# Patient Record
Sex: Female | Born: 1970 | Race: Black or African American | Hispanic: No | Smoking: Former smoker
Health system: Southern US, Community
[De-identification: ages and names within clinical notes are randomized; demographics above are authoritative.]

## PROBLEM LIST (undated history)

## (undated) DIAGNOSIS — R51 Headache: Secondary | ICD-10-CM

## (undated) DIAGNOSIS — R519 Headache, unspecified: Secondary | ICD-10-CM

## (undated) DIAGNOSIS — I161 Hypertensive emergency: Secondary | ICD-10-CM

## (undated) DIAGNOSIS — R112 Nausea with vomiting, unspecified: Secondary | ICD-10-CM

## (undated) DIAGNOSIS — R197 Diarrhea, unspecified: Secondary | ICD-10-CM

## (undated) DIAGNOSIS — I509 Heart failure, unspecified: Secondary | ICD-10-CM

## (undated) DIAGNOSIS — R569 Unspecified convulsions: Secondary | ICD-10-CM

## (undated) DIAGNOSIS — R011 Cardiac murmur, unspecified: Secondary | ICD-10-CM

## (undated) DIAGNOSIS — E559 Vitamin D deficiency, unspecified: Secondary | ICD-10-CM

## (undated) DIAGNOSIS — J189 Pneumonia, unspecified organism: Secondary | ICD-10-CM

## (undated) DIAGNOSIS — H6120 Impacted cerumen, unspecified ear: Secondary | ICD-10-CM

## (undated) DIAGNOSIS — J302 Other seasonal allergic rhinitis: Secondary | ICD-10-CM

## (undated) DIAGNOSIS — I10 Essential (primary) hypertension: Secondary | ICD-10-CM

## (undated) DIAGNOSIS — E785 Hyperlipidemia, unspecified: Secondary | ICD-10-CM

## (undated) DIAGNOSIS — Z8719 Personal history of other diseases of the digestive system: Secondary | ICD-10-CM

## (undated) HISTORY — PX: TONSILLECTOMY AND ADENOIDECTOMY: SUR1326

---
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
## 1998-07-11 HISTORY — PX: TUBAL LIGATION: SHX77

## 2006-05-18 ENCOUNTER — Emergency Department (HOSPITAL_COMMUNITY): Admission: EM | Admit: 2006-05-18 | Discharge: 2006-05-19 | Payer: Self-pay | Admitting: Emergency Medicine

## 2007-10-02 ENCOUNTER — Emergency Department (HOSPITAL_COMMUNITY): Admission: EM | Admit: 2007-10-02 | Discharge: 2007-10-02 | Payer: Self-pay | Admitting: Emergency Medicine

## 2009-05-11 ENCOUNTER — Inpatient Hospital Stay (HOSPITAL_COMMUNITY): Admission: AD | Admit: 2009-05-11 | Discharge: 2009-05-12 | Payer: Self-pay | Admitting: Obstetrics and Gynecology

## 2009-12-30 ENCOUNTER — Ambulatory Visit: Payer: Self-pay | Admitting: Nurse Practitioner

## 2009-12-30 ENCOUNTER — Emergency Department (HOSPITAL_COMMUNITY): Admission: EM | Admit: 2009-12-30 | Discharge: 2009-12-30 | Payer: Self-pay | Admitting: Emergency Medicine

## 2009-12-30 ENCOUNTER — Encounter: Payer: Self-pay | Admitting: Obstetrics & Gynecology

## 2009-12-31 ENCOUNTER — Emergency Department (HOSPITAL_COMMUNITY): Admission: EM | Admit: 2009-12-31 | Discharge: 2009-12-31 | Payer: Self-pay | Admitting: Emergency Medicine

## 2010-01-05 ENCOUNTER — Emergency Department (HOSPITAL_COMMUNITY): Admission: EM | Admit: 2010-01-05 | Discharge: 2010-01-05 | Payer: Self-pay | Admitting: Emergency Medicine

## 2010-01-24 ENCOUNTER — Emergency Department (HOSPITAL_COMMUNITY): Admission: EM | Admit: 2010-01-24 | Discharge: 2010-01-24 | Payer: Self-pay | Admitting: Emergency Medicine

## 2010-09-25 LAB — URINALYSIS, ROUTINE W REFLEX MICROSCOPIC
Bilirubin Urine: NEGATIVE
Specific Gravity, Urine: 1.016 (ref 1.005–1.030)

## 2010-09-25 LAB — CBC
HCT: 40.7 % (ref 36.0–46.0)
MCH: 25.6 pg — ABNORMAL LOW (ref 26.0–34.0)
MCHC: 32.2 g/dL (ref 30.0–36.0)
MCV: 79.5 fL (ref 78.0–100.0)
Platelets: 297 10*3/uL (ref 150–400)
RBC: 5.11 MIL/uL (ref 3.87–5.11)
RDW: 17.5 % — ABNORMAL HIGH (ref 11.5–15.5)
WBC: 8.1 10*3/uL (ref 4.0–10.5)

## 2010-09-25 LAB — DIFFERENTIAL
Basophils Relative: 1 % (ref 0–1)
Eosinophils Relative: 2 % (ref 0–5)
Lymphs Abs: 2.1 10*3/uL (ref 0.7–4.0)
Neutrophils Relative %: 66 % (ref 43–77)

## 2010-09-25 LAB — COMPREHENSIVE METABOLIC PANEL
ALT: 14 U/L (ref 0–35)
AST: 20 U/L (ref 0–37)
Albumin: 4 g/dL (ref 3.5–5.2)
Calcium: 9.6 mg/dL (ref 8.4–10.5)
GFR calc Af Amer: >60 mL/min (ref >60)
Total Protein: 7.6 g/dL (ref 6.0–8.3)

## 2010-09-26 LAB — URINALYSIS, ROUTINE W REFLEX MICROSCOPIC
Bilirubin Urine: NEGATIVE
Glucose, UA: NEGATIVE mg/dL
Glucose, UA: NEGATIVE mg/dL
Ketones, ur: NEGATIVE mg/dL
Nitrite: POSITIVE — AB
Protein, ur: 100 mg/dL — AB
Protein, ur: NEGATIVE mg/dL
Urobilinogen, UA: 0.2 mg/dL (ref 0.0–1.0)
pH: 7.5 (ref 5.0–8.0)

## 2010-09-26 LAB — URINE CULTURE
Colony Count: >=100000
Colony Count: >=100000

## 2010-09-26 LAB — COMPREHENSIVE METABOLIC PANEL
ALT: 11 U/L (ref 0–35)
Alkaline Phosphatase: 72 U/L (ref 39–117)
Alkaline Phosphatase: 66 U/L (ref 39–117)
BUN: 7 mg/dL (ref 6–23)
BUN: 9 mg/dL (ref 6–23)
CO2: 30 mEq/L (ref 19–32)
CO2: 29 mEq/L (ref 19–32)
Calcium: 10.2 mg/dL (ref 8.4–10.5)
Chloride: 102 mEq/L (ref 96–112)
Glucose, Bld: 108 mg/dL — ABNORMAL HIGH (ref 70–99)
Glucose, Bld: 100 mg/dL — ABNORMAL HIGH (ref 70–99)
Potassium: 3.4 mEq/L — ABNORMAL LOW (ref 3.5–5.1)
Potassium: 3.9 mEq/L (ref 3.5–5.1)
Sodium: 136 mEq/L (ref 135–145)
Total Bilirubin: 0.7 mg/dL (ref 0.3–1.2)
Total Protein: 9.6 g/dL — ABNORMAL HIGH (ref 6.0–8.3)

## 2010-09-26 LAB — DIFFERENTIAL
Basophils Absolute: 0 10*3/uL (ref 0.0–0.1)
Basophils Relative: 0 % (ref 0–1)
Eosinophils Absolute: 0.3 10*3/uL (ref 0.0–0.7)
Eosinophils Relative: 3 % (ref 0–5)
Lymphocytes Relative: 22 % (ref 12–46)
Monocytes Absolute: 0.6 10*3/uL (ref 0.1–1.0)

## 2010-09-26 LAB — CBC
HCT: 44.2 % (ref 36.0–46.0)
HCT: 38.7 % (ref 36.0–46.0)
Hemoglobin: 12.8 g/dL (ref 12.0–15.0)
MCV: 77.1 fL — ABNORMAL LOW (ref 78.0–100.0)
Platelets: 393 10*3/uL (ref 150–400)
RBC: 5.69 MIL/uL — ABNORMAL HIGH (ref 3.87–5.11)
RBC: 5.02 MIL/uL (ref 3.87–5.11)
WBC: 8.6 10*3/uL (ref 4.0–10.5)

## 2010-09-26 LAB — WET PREP, GENITAL: Yeast Wet Prep HPF POC: NONE SEEN

## 2010-09-26 LAB — LIPASE, BLOOD: Lipase: 25 U/L (ref 11–59)

## 2010-09-26 LAB — URINE MICROSCOPIC-ADD ON

## 2010-09-26 LAB — POCT PREGNANCY, URINE: Preg Test, Ur: NEGATIVE

## 2010-09-26 LAB — APTT: aPTT: 35 seconds (ref 24–37)

## 2010-09-27 LAB — DIFFERENTIAL
Basophils Absolute: 0 10*3/uL (ref 0.0–0.1)
Basophils Relative: 1 % (ref 0–1)
Eosinophils Absolute: 0.1 10*3/uL (ref 0.0–0.7)
Monocytes Absolute: 0.6 10*3/uL (ref 0.1–1.0)
Monocytes Relative: 6 % (ref 3–12)
Neutro Abs: 5.3 10*3/uL (ref 1.7–7.7)

## 2010-09-27 LAB — URINE MICROSCOPIC-ADD ON

## 2010-09-27 LAB — COMPREHENSIVE METABOLIC PANEL
ALT: 14 U/L (ref 0–35)
AST: 25 U/L (ref 0–37)
CO2: 27 mEq/L (ref 19–32)
Calcium: 9.5 mg/dL (ref 8.4–10.5)
Creatinine, Ser: 0.94 mg/dL (ref 0.4–1.2)
GFR calc Af Amer: >60 mL/min (ref >60)
GFR calc non Af Amer: >60 mL/min (ref >60)
Sodium: 135 mEq/L (ref 135–145)
Total Protein: 7.8 g/dL (ref 6.0–8.3)

## 2010-09-27 LAB — LACTIC ACID, PLASMA: Lactic Acid, Venous: 1.6 mmol/L (ref 0.5–2.2)

## 2010-09-27 LAB — POCT PREGNANCY, URINE: Preg Test, Ur: NEGATIVE

## 2010-09-27 LAB — URINALYSIS, ROUTINE W REFLEX MICROSCOPIC
Ketones, ur: 40 mg/dL — AB
Leukocytes, UA: NEGATIVE
Protein, ur: NEGATIVE mg/dL
Urobilinogen, UA: 1 mg/dL (ref 0.0–1.0)

## 2010-09-27 LAB — POCT I-STAT, CHEM 8
Calcium, Ion: 1.05 mmol/L — ABNORMAL LOW (ref 1.12–1.32)
Chloride: 96 mEq/L (ref 96–112)
Glucose, Bld: 96 mg/dL (ref 70–99)
HCT: 51 % — ABNORMAL HIGH (ref 36.0–46.0)
Hemoglobin: 17.3 g/dL — ABNORMAL HIGH (ref 12.0–15.0)
TCO2: 29 mmol/L (ref 0–100)

## 2010-09-27 LAB — RAPID URINE DRUG SCREEN, HOSP PERFORMED
Benzodiazepines: POSITIVE — AB
Cocaine: NOT DETECTED
Opiates: POSITIVE — AB
Tetrahydrocannabinol: POSITIVE — AB

## 2010-09-27 LAB — CBC
HCT: 46.2 % — ABNORMAL HIGH (ref 36.0–46.0)
Hemoglobin: 15.2 g/dL — ABNORMAL HIGH (ref 12.0–15.0)
MCH: 25 pg — ABNORMAL LOW (ref 26.0–34.0)
MCH: 25.4 pg — ABNORMAL LOW (ref 26.0–34.0)
MCHC: 32.1 g/dL (ref 30.0–36.0)
MCHC: 32.8 g/dL (ref 30.0–36.0)
Platelets: 312 10*3/uL (ref 150–400)
RDW: 16.8 % — ABNORMAL HIGH (ref 11.5–15.5)
RDW: 17 % — ABNORMAL HIGH (ref 11.5–15.5)

## 2010-10-13 LAB — WET PREP, GENITAL
Clue Cells Wet Prep HPF POC: NONE SEEN
Trich, Wet Prep: NONE SEEN
Yeast Wet Prep HPF POC: NONE SEEN

## 2010-10-13 LAB — GC/CHLAMYDIA PROBE AMP, GENITAL: GC Probe Amp, Genital: NEGATIVE

## 2010-10-13 LAB — CBC
HCT: 32.2 % — ABNORMAL LOW (ref 36.0–46.0)
Hemoglobin: 10.4 g/dL — ABNORMAL LOW (ref 12.0–15.0)
MCV: 78.1 fL (ref 78.0–100.0)
Platelets: 297 10*3/uL (ref 150–400)
RDW: 17.7 % — ABNORMAL HIGH (ref 11.5–15.5)

## 2010-10-13 LAB — SAMPLE TO BLOOD BANK

## 2011-04-04 LAB — BASIC METABOLIC PANEL
CO2: 26
Calcium: 9
Creatinine, Ser: 0.73
GFR calc Af Amer: >60
GFR calc non Af Amer: >60
Glucose, Bld: 89

## 2011-05-30 ENCOUNTER — Encounter: Payer: Self-pay | Admitting: *Deleted

## 2011-05-30 ENCOUNTER — Emergency Department (HOSPITAL_COMMUNITY)
Admission: EM | Admit: 2011-05-30 | Discharge: 2011-05-30 | Disposition: A | Discharge status: Home / Self Care | Payer: Self-pay | Attending: Emergency Medicine | Admitting: Emergency Medicine

## 2011-05-30 DIAGNOSIS — L299 Pruritus, unspecified: Secondary | ICD-10-CM | POA: Insufficient documentation

## 2011-05-30 DIAGNOSIS — R21 Rash and other nonspecific skin eruption: Secondary | ICD-10-CM | POA: Insufficient documentation

## 2011-05-30 MED ORDER — PREDNISONE 10 MG PO TABS: 40.0000 mg | ORAL_TABLET | Freq: Every day | ORAL | Status: AC

## 2011-05-30 MED ORDER — DIPHENHYDRAMINE HCL 25 MG PO CAPS: 25.0000 mg | ORAL_CAPSULE | Freq: Once | ORAL | Status: DC

## 2011-05-30 MED ORDER — PREDNISONE 20 MG PO TABS: 50.0000 mg | ORAL_TABLET | Freq: Once | ORAL | Status: DC

## 2011-05-30 NOTE — ED Provider Notes (Signed)
History    40yF with rash. Started Tuesday. Thinks started on chest. Very itchy. No contacts with similar. No new exposures identified. No n/v/d. No cough, dyspnea, or wheeze. No hx of similar. No urinary complaints. No fever or chills.  CSN: [id number] Arrival date & time: 05/30/2011  1:16 PM   None     Chief Complaint  Patient presents with  . Rash    (Consider location/radiation/quality/duration/timing/severity/associated sxs/prior treatment) HPI  History reviewed. No pertinent past medical history.  History reviewed. No pertinent past surgical history.  History reviewed. No pertinent family history.  History  Substance Use Topics  . Smoking status: Not on file  . Smokeless tobacco: Not on file  . Alcohol Use: Not on file    OB History    Grav Para Term Preterm Abortions TAB SAB Ect Mult Living                  Review of Systems   Review of symptoms negative unless otherwise noted in HPI.  Allergies  Review of patient's allergies indicates no known allergies.  Home Medications   Current Outpatient Rx  Name Route Sig Dispense Refill  . PREDNISONE 10 MG PO TABS Oral Take 4 tablets (40 mg total) by mouth daily. 10 tablet 0    BP 194/111  Pulse 86  Temp(Src) 98.6 F (37 C) (Oral)  SpO2 100%  Physical Exam  Nursing note and vitals reviewed. Constitutional: She appears well-developed and well-nourished. No distress.  HENT:  Head: Normocephalic and atraumatic.  Eyes: Conjunctivae are normal. Right eye exhibits no discharge. Left eye exhibits no discharge.  Neck: Neck supple.  Cardiovascular: Normal rate, regular rhythm and normal heart sounds.  Exam reveals no gallop and no friction rub.   No murmur heard. Pulmonary/Chest: Effort normal and breath sounds normal. No respiratory distress.  Abdominal: Soft. She exhibits no distension. There is no tenderness.  Musculoskeletal: She exhibits no edema and no tenderness.  Neurological: She is alert.  Skin:  Skin is warm and dry. Rash noted.       Diffuse maculopapular rash primarily affecting trunk but also b/l proximal upper extremities and proximal thighs. Mild neck and facial involvement. No drainage. No mucus membrane involvement. Blanches.  Psychiatric: She has a normal mood and affect. Her behavior is normal. Thought content normal.    ED Course  Procedures (including critical care time)  Labs Reviewed - No data to display No results found.   1. Rash and nonspecific skin eruption       MDM  40yF with diffuse maculopapular rash. No respiratory distress. No mucus membrane involvement. Clinically appears wells. Etiology unclear. Will give trial of steroids and outpt fu. Discussed signs/symptoms to return for immediate re-eval.        Raeford Razor, MD 06/02/11 [phone]

## 2011-05-30 NOTE — ED Notes (Signed)
To ed for eval of rash to entire body...started last Tuesday. Itching.

## 2013-06-21 ENCOUNTER — Emergency Department (HOSPITAL_COMMUNITY): Payer: Self-pay

## 2013-06-21 ENCOUNTER — Encounter (HOSPITAL_COMMUNITY): Payer: Self-pay | Admitting: Emergency Medicine

## 2013-06-21 ENCOUNTER — Emergency Department (HOSPITAL_COMMUNITY)
Admission: EM | Admit: 2013-06-21 | Discharge: 2013-06-21 | Disposition: A | Discharge status: Home / Self Care | Payer: Self-pay | Attending: Emergency Medicine | Admitting: Emergency Medicine

## 2013-06-21 DIAGNOSIS — Z79899 Other long term (current) drug therapy: Secondary | ICD-10-CM | POA: Insufficient documentation

## 2013-06-21 DIAGNOSIS — I509 Heart failure, unspecified: Secondary | ICD-10-CM | POA: Insufficient documentation

## 2013-06-21 DIAGNOSIS — I10 Essential (primary) hypertension: Secondary | ICD-10-CM | POA: Insufficient documentation

## 2013-06-21 DIAGNOSIS — R000 Tachycardia, unspecified: Secondary | ICD-10-CM | POA: Insufficient documentation

## 2013-06-21 DIAGNOSIS — R0989 Other specified symptoms and signs involving the circulatory and respiratory systems: Secondary | ICD-10-CM | POA: Insufficient documentation

## 2013-06-21 DIAGNOSIS — R079 Chest pain, unspecified: Secondary | ICD-10-CM

## 2013-06-21 DIAGNOSIS — F411 Generalized anxiety disorder: Secondary | ICD-10-CM | POA: Insufficient documentation

## 2013-06-21 DIAGNOSIS — R0789 Other chest pain: Secondary | ICD-10-CM | POA: Insufficient documentation

## 2013-06-21 DIAGNOSIS — F17200 Nicotine dependence, unspecified, uncomplicated: Secondary | ICD-10-CM | POA: Insufficient documentation

## 2013-06-21 DIAGNOSIS — I2510 Atherosclerotic heart disease of native coronary artery without angina pectoris: Secondary | ICD-10-CM | POA: Insufficient documentation

## 2013-06-21 DIAGNOSIS — R0609 Other forms of dyspnea: Secondary | ICD-10-CM | POA: Insufficient documentation

## 2013-06-21 HISTORY — DX: Essential (primary) hypertension: I10

## 2013-06-21 HISTORY — DX: Heart failure, unspecified: I50.9

## 2013-06-21 LAB — POCT I-STAT TROPONIN I: Troponin i, poc: 0.04 ng/mL (ref 0.00–0.08)

## 2013-06-21 LAB — BASIC METABOLIC PANEL
BUN: 19 mg/dL (ref 6–23)
CO2: 26 mEq/L (ref 19–32)
GFR calc Af Amer: 75 mL/min — ABNORMAL LOW (ref >90)
Glucose, Bld: 114 mg/dL — ABNORMAL HIGH (ref 70–99)
Potassium: 3.2 mEq/L — ABNORMAL LOW (ref 3.5–5.1)
Sodium: 138 mEq/L (ref 135–145)

## 2013-06-21 LAB — CBC
HCT: 43.3 % (ref 36.0–46.0)
Hemoglobin: 15 g/dL (ref 12.0–15.0)
MCHC: 34.6 g/dL (ref 30.0–36.0)
MCV: 79.6 fL (ref 78.0–100.0)
RBC: 5.44 MIL/uL — ABNORMAL HIGH (ref 3.87–5.11)
RDW: 14.5 % (ref 11.5–15.5)

## 2013-06-21 MED ORDER — LISINOPRIL 10 MG PO TABS
10.0000 mg | ORAL_TABLET | Freq: Once | ORAL | Status: AC
  Administered 2013-06-21: 10 mg via ORAL
  Filled 2013-06-21: qty 1

## 2013-06-21 MED ORDER — MORPHINE SULFATE 4 MG/ML IJ SOLN
4.0000 mg | Freq: Once | INTRAMUSCULAR | Status: AC
  Administered 2013-06-21: 4 mg via INTRAVENOUS
  Filled 2013-06-21: qty 1

## 2013-06-21 MED ORDER — LABETALOL HCL 5 MG/ML IV SOLN
20.0000 mg | INTRAVENOUS | Status: DC | PRN
  Administered 2013-06-21: 20 mg via INTRAVENOUS
  Filled 2013-06-21: qty 4

## 2013-06-21 MED ORDER — ASPIRIN 81 MG PO CHEW
324.0000 mg | CHEWABLE_TABLET | Freq: Once | ORAL | Status: AC
  Administered 2013-06-21: 324 mg via ORAL
  Filled 2013-06-21: qty 4

## 2013-06-21 MED ORDER — HYDROCODONE-ACETAMINOPHEN 5-325 MG PO TABS
1.0000 | ORAL_TABLET | Freq: Once | ORAL | Status: AC
Start: 2013-06-21 — End: 2013-06-21
  Administered 2013-06-21: 1 via ORAL
  Filled 2013-06-21: qty 1

## 2013-06-21 MED ORDER — ONDANSETRON HCL 4 MG/2ML IJ SOLN
4.0000 mg | Freq: Once | INTRAMUSCULAR | Status: AC
  Administered 2013-06-21: 4 mg via INTRAVENOUS
  Filled 2013-06-21: qty 2

## 2013-06-21 MED ORDER — LISINOPRIL-HYDROCHLOROTHIAZIDE 20-12.5 MG PO TABS: 1.0000 | ORAL_TABLET | Freq: Every day | ORAL | Status: DC

## 2013-06-21 MED ORDER — HYDROCHLOROTHIAZIDE 25 MG PO TABS
25.0000 mg | ORAL_TABLET | Freq: Once | ORAL | Status: AC
  Administered 2013-06-21: 25 mg via ORAL
  Filled 2013-06-21: qty 1

## 2013-06-21 NOTE — ED Notes (Addendum)
Pt in c/o left sided chest pain that radiates into her back, also shortness of breath that is worse with exertion, states symptoms have gotten progressively worse, hx of CHF, noncompliant with BP medication

## 2013-06-21 NOTE — ED Notes (Signed)
Chest pain improved with pain medication.  Pt to receive call from Care Management tomorrow regarding need for assistance with prescription for HTN.

## 2013-06-21 NOTE — ED Notes (Signed)
Dr. Wentz at the bedside.  

## 2013-06-21 NOTE — ED Notes (Signed)
Pt tearful; says she is tired of hurting.  Is unable to afford her HTN meds.  Care management being contacted to see if we can get some assistance for her medications.

## 2013-06-21 NOTE — ED Provider Notes (Addendum)
CSN: [id number]     Arrival date & time 06/21/13  1629 History   First MD Initiated Contact with Patient 06/21/13 1651     Chief Complaint  Patient presents with  . Chest Pain   (Consider location/radiation/quality/duration/timing/severity/associated sxs/prior Treatment) HPI Comments: Emily Key is a 42 y.o. female who states that she has intermittent chest pain, pressure-like, exacerbated by ambulation, improved by rest, and persistent, over one week. She has multiple episodes, each day. Occasionally wakes her at night. There is no associated diaphoresis, nausea, vomiting, weakness, or dizziness. She does have dyspnea when she has the pain. She continues to smoke cigarettes. There are no other known modifying factors.  Patient is a 42 y.o. female presenting with chest pain. The history is provided by the patient.  Chest Pain   Past Medical History  Diagnosis Date  . Coronary artery disease   . Hypertension   . CHF (congestive heart failure)    History reviewed. No pertinent past surgical history. History reviewed. No pertinent family history. History  Substance Use Topics  . Smoking status: Current Every Day Smoker  . Smokeless tobacco: Not on file  . Alcohol Use: Not on file   OB History   Grav Para Term Preterm Abortions TAB SAB Ect Mult Living                 Review of Systems  Cardiovascular: Positive for chest pain.  All other systems reviewed and are negative.    Allergies  Review of patient's allergies indicates no known allergies.  Home Medications   Current Outpatient Rx  Name  Route  Sig  Dispense  Refill  . lisinopril-hydrochlorothiazide (PRINZIDE) 20-12.5 MG per tablet   Oral   Take 1 tablet by mouth daily.   30 tablet   0    BP 172/101  Pulse 75  Temp(Src) 98 F (36.7 C) (Oral)  Resp 23  Ht 5\' 8"  (1.727 m)  Wt 199 lb (90.266 kg)  BMI 30.26 kg/m2  SpO2 98%  LMP 06/06/2013 Physical Exam  Nursing note and vitals reviewed. Constitutional:  She is oriented to person, place, and time. She appears well-developed and well-nourished.  HENT:  Head: Normocephalic and atraumatic.  Eyes: Conjunctivae and EOM are normal. Pupils are equal, round, and reactive to light.  Neck: Normal range of motion and phonation normal. Neck supple.  Cardiovascular: Normal rate, regular rhythm and intact distal pulses.   Irregular rate. Borderline tachycardia. Hypertension  Pulmonary/Chest: Effort normal and breath sounds normal. She exhibits no tenderness.  Abdominal: Soft. She exhibits no distension. There is no tenderness. There is no guarding.  Musculoskeletal: Normal range of motion.  Neurological: She is alert and oriented to person, place, and time. She exhibits normal muscle tone.  Skin: Skin is warm and dry.  Psychiatric: Her behavior is normal. Judgment and thought content normal.  Anxious    ED Course  Procedures (including critical care time) Medications  labetalol (NORMODYNE,TRANDATE) injection 20 mg (20 mg Intravenous Given 06/21/13 1801)  aspirin chewable tablet 324 mg (324 mg Oral Given 06/21/13 1709)  morphine 4 MG/ML injection 4 mg (4 mg Intravenous Given 06/21/13 1710)  ondansetron (ZOFRAN) injection 4 mg (4 mg Intravenous Given 06/21/13 1710)  lisinopril (PRINIVIL,ZESTRIL) tablet 10 mg (10 mg Oral Given 06/21/13 2235)  hydrochlorothiazide (HYDRODIURIL) tablet 25 mg (25 mg Oral Given 06/21/13 2236)  HYDROcodone-acetaminophen (NORCO/VICODIN) 5-325 MG per tablet 1 tablet (1 tablet Oral Given 06/21/13 2236)    Patient Vitals for the past  24 hrs:  BP Temp Temp src Pulse Resp SpO2 Height Weight  06/21/13 2330 172/101 mmHg - - 75 23 98 % - -  06/21/13 2235 179/132 mmHg - - - - - - -  06/21/13 2232 179/132 mmHg - - 74 - 100 % - -  06/21/13 2230 179/132 mmHg - - 74 26 100 % - -  06/21/13 2215 172/102 mmHg - - 78 26 94 % - -  06/21/13 2145 184/123 mmHg - - 73 41 100 % - -  06/21/13 2130 179/112 mmHg - - 75 14 100 % - -  06/21/13  2116 182/109 mmHg - - 73 13 95 % - -  06/21/13 2045 189/131 mmHg - - 78 20 97 % - -  06/21/13 2015 175/117 mmHg - - 75 16 100 % - -  06/21/13 1930 175/113 mmHg - - 76 25 96 % - -  06/21/13 1922 182/129 mmHg - - 73 24 100 % 5\' 8"  (1.727 m) 199 lb (90.266 kg)  06/21/13 1806 163/117 mmHg - - 77 17 97 % - -  06/21/13 1751 210/160 mmHg - - - - - - -  06/21/13 1744 209/159 mmHg - - - - - - -  06/21/13 1639 208/133 mmHg 98 F (36.7 C) Oral 85 20 100 % - -   1812 Reevaluation with update and discussion. After initial assessment and treatment, an updated evaluation reveals MAP, 176 to 132, after single dose of Labetolol. Emily Key L   I discussed her financial situation with the case manager, who will contact her tomorrow to see about getting her some help with medication purchase.   CRITICAL CARE Performed by: Flint Melter Total critical care time: 40 minutes Critical care time was exclusive of separately billable procedures and treating other patients. Critical care was necessary to treat or prevent imminent or life-threatening deterioration. Critical care was time spent personally by me on the following activities: development of treatment plan with patient and/or surrogate as well as nursing, discussions with consultants, evaluation of patient's response to treatment, examination of patient, obtaining history from patient or surrogate, ordering and performing treatments and interventions, ordering and review of laboratory studies, ordering and review of radiographic studies, pulse oximetry and re-evaluation of patient's condition.  Labs Review Labs Reviewed  CBC - Abnormal; Notable for the following:    RBC 5.44 (*)    All other components within normal limits  BASIC METABOLIC PANEL - Abnormal; Notable for the following:    Potassium 3.2 (*)    Glucose, Bld 114 (*)    GFR calc non Af Amer 64 (*)    GFR calc Af Amer 75 (*)    All other components within normal limits  PRO B  NATRIURETIC PEPTIDE - Abnormal; Notable for the following:    Pro B Natriuretic peptide (BNP) 3177.0 (*)    All other components within normal limits  POCT I-STAT TROPONIN I   Imaging Review Dg Chest 2 View  06/21/2013   CLINICAL DATA:  Chest pain  EXAM: CHEST  2 VIEW  COMPARISON:  None.  FINDINGS: Cardiac shadow is mildly enlarged. The lungs are clear bilaterally. No acute bony abnormalities noted.  IMPRESSION: Cardiomegaly without acute abnormality.   Electronically Signed   By: Alcide Clever M.D.   On: 06/21/2013 18:40    EKG Interpretation   None       Date: 06/21/13  Rate: 101  Rhythm: sinus tachycardia  QRS Axis: normal  PR and QT Intervals:  normal  ST/T Wave abnormalities: normal  PR and QRS Conduction Disutrbances:none  Narrative Interpretation:   Old EKG Reviewed: none available   MDM   1. Hypertension   2. Nonspecific chest pain    Hypertension improved after treatment in the emergency department. No evidence for acute cord syndrome, pneumonia, PE, or metabolic instability. There is no evidence for endorgan damage. She is stable for discharge and outpatient management   Nursing Notes Reviewed/ Care Coordinated, and agree without changes. Applicable Imaging Reviewed.  Interpretation of Laboratory Data incorporated into ED treatment   Plan: Home Medications- lisinopril, hydrochlorothiazide; Home Treatments and Observation- rest, watch for progressive symptoms; return here if the recommended treatment, does not improve the symptoms; Recommended follow up- PCP of choice for ongoing medical management     Flint Melter, MD 06/21/13 2340  Flint Melter, MD 06/22/13 [phone]

## 2013-06-21 NOTE — ED Notes (Signed)
MD made aware of manual pressure and orders received for labetalol

## 2013-06-21 NOTE — ED Notes (Signed)
Pt family left.  Pt pain remains unchanged.  MD aware.

## 2013-06-22 NOTE — Care Management (Signed)
Spoke with the patient regarding her prescription.  This medication is on the $4 list at [facility].  I explained the MATCH program co-pay is $3 dollars and a once per callander year assistance.  Advised her to go to [facility] for her medication.

## 2013-10-14 ENCOUNTER — Emergency Department (HOSPITAL_COMMUNITY): Payer: Self-pay

## 2013-10-14 ENCOUNTER — Encounter (HOSPITAL_COMMUNITY): Payer: Self-pay | Admitting: Emergency Medicine

## 2013-10-14 ENCOUNTER — Emergency Department (HOSPITAL_COMMUNITY)
Admission: EM | Admit: 2013-10-14 | Discharge: 2013-10-14 | Disposition: A | Discharge status: Home / Self Care | Payer: Self-pay | Attending: Emergency Medicine | Admitting: Emergency Medicine

## 2013-10-14 DIAGNOSIS — R630 Anorexia: Secondary | ICD-10-CM | POA: Insufficient documentation

## 2013-10-14 DIAGNOSIS — R000 Tachycardia, unspecified: Secondary | ICD-10-CM | POA: Insufficient documentation

## 2013-10-14 DIAGNOSIS — I2510 Atherosclerotic heart disease of native coronary artery without angina pectoris: Secondary | ICD-10-CM | POA: Insufficient documentation

## 2013-10-14 DIAGNOSIS — Z79899 Other long term (current) drug therapy: Secondary | ICD-10-CM | POA: Insufficient documentation

## 2013-10-14 DIAGNOSIS — F17200 Nicotine dependence, unspecified, uncomplicated: Secondary | ICD-10-CM | POA: Insufficient documentation

## 2013-10-14 DIAGNOSIS — N200 Calculus of kidney: Secondary | ICD-10-CM

## 2013-10-14 DIAGNOSIS — R109 Unspecified abdominal pain: Secondary | ICD-10-CM

## 2013-10-14 DIAGNOSIS — R61 Generalized hyperhidrosis: Secondary | ICD-10-CM | POA: Insufficient documentation

## 2013-10-14 DIAGNOSIS — I509 Heart failure, unspecified: Secondary | ICD-10-CM | POA: Insufficient documentation

## 2013-10-14 DIAGNOSIS — Z3202 Encounter for pregnancy test, result negative: Secondary | ICD-10-CM | POA: Insufficient documentation

## 2013-10-14 DIAGNOSIS — I10 Essential (primary) hypertension: Secondary | ICD-10-CM | POA: Insufficient documentation

## 2013-10-14 LAB — LIPASE, BLOOD: Lipase: 16 U/L (ref 11–59)

## 2013-10-14 LAB — URINALYSIS, ROUTINE W REFLEX MICROSCOPIC
Bilirubin Urine: NEGATIVE
Glucose, UA: 100 mg/dL — AB
Ketones, ur: 15 mg/dL — AB
Leukocytes, UA: NEGATIVE
Nitrite: NEGATIVE
SPECIFIC GRAVITY, URINE: 1.02 (ref 1.005–1.030)
UROBILINOGEN UA: 0.2 mg/dL (ref 0.0–1.0)
pH: 7 (ref 5.0–8.0)

## 2013-10-14 LAB — CBC WITH DIFFERENTIAL/PLATELET
BASOS ABS: 0 10*3/uL (ref 0.0–0.1)
Basophils Relative: 0 % (ref 0–1)
EOS ABS: 0.1 10*3/uL (ref 0.0–0.7)
EOS PCT: 1 % (ref 0–5)
HCT: 41.6 % (ref 36.0–46.0)
Hemoglobin: 14.3 g/dL (ref 12.0–15.0)
Lymphocytes Relative: 10 % — ABNORMAL LOW (ref 12–46)
Lymphs Abs: 1.2 10*3/uL (ref 0.7–4.0)
MCH: 28.4 pg (ref 26.0–34.0)
MCHC: 34.4 g/dL (ref 30.0–36.0)
MCV: 82.7 fL (ref 78.0–100.0)
MONO ABS: 0.9 10*3/uL (ref 0.1–1.0)
Monocytes Relative: 8 % (ref 3–12)
Neutro Abs: 9.5 10*3/uL — ABNORMAL HIGH (ref 1.7–7.7)
Neutrophils Relative %: 81 % — ABNORMAL HIGH (ref 43–77)
Platelets: 302 10*3/uL (ref 150–400)
RBC: 5.03 MIL/uL (ref 3.87–5.11)
RDW: 14.8 % (ref 11.5–15.5)
WBC: 11.6 10*3/uL — ABNORMAL HIGH (ref 4.0–10.5)

## 2013-10-14 LAB — I-STAT TROPONIN, ED: TROPONIN I, POC: 0.01 ng/mL (ref 0.00–0.08)

## 2013-10-14 LAB — COMPREHENSIVE METABOLIC PANEL
ALT: 14 U/L (ref 0–35)
AST: 30 U/L (ref 0–37)
Albumin: 4.4 g/dL (ref 3.5–5.2)
Alkaline Phosphatase: 69 U/L (ref 39–117)
BUN: 19 mg/dL (ref 6–23)
CALCIUM: 9.8 mg/dL (ref 8.4–10.5)
CO2: 22 mEq/L (ref 19–32)
CREATININE: 1.05 mg/dL (ref 0.50–1.10)
Chloride: 98 mEq/L (ref 96–112)
GFR calc non Af Amer: 64 mL/min — ABNORMAL LOW (ref >90)
GFR, EST AFRICAN AMERICAN: 74 mL/min — AB (ref >90)
Glucose, Bld: 114 mg/dL — ABNORMAL HIGH (ref 70–99)
Potassium: 3.7 mEq/L (ref 3.7–5.3)
Sodium: 139 mEq/L (ref 137–147)
TOTAL PROTEIN: 9.4 g/dL — AB (ref 6.0–8.3)
Total Bilirubin: 0.8 mg/dL (ref 0.3–1.2)

## 2013-10-14 LAB — URINE MICROSCOPIC-ADD ON

## 2013-10-14 LAB — POC URINE PREG, ED: PREG TEST UR: NEGATIVE

## 2013-10-14 LAB — TROPONIN I: Troponin I: <0.3 ng/mL (ref <0.30)

## 2013-10-14 MED ORDER — LISINOPRIL 20 MG PO TABS
20.0000 mg | ORAL_TABLET | Freq: Once | ORAL | Status: AC
Start: 2013-10-14 — End: 2013-10-14
  Administered 2013-10-14: 20 mg via ORAL
  Filled 2013-10-14: qty 1

## 2013-10-14 MED ORDER — IOHEXOL 300 MG/ML  SOLN
100.0000 mL | Freq: Once | INTRAMUSCULAR | Status: AC | PRN
  Administered 2013-10-14: 100 mL via INTRAVENOUS

## 2013-10-14 MED ORDER — HYDROMORPHONE HCL PF 1 MG/ML IJ SOLN
1.0000 mg | Freq: Once | INTRAMUSCULAR | Status: AC
  Administered 2013-10-14: 1 mg via INTRAVENOUS
  Filled 2013-10-14: qty 1

## 2013-10-14 MED ORDER — IOHEXOL 300 MG/ML  SOLN
25.0000 mL | Freq: Once | INTRAMUSCULAR | Status: AC | PRN
  Administered 2013-10-14: 25 mL via ORAL

## 2013-10-14 MED ORDER — OXYCODONE-ACETAMINOPHEN 5-325 MG PO TABS: 1.0000 | ORAL_TABLET | ORAL | Status: DC | PRN

## 2013-10-14 MED ORDER — ONDANSETRON HCL 4 MG/2ML IJ SOLN
4.0000 mg | Freq: Once | INTRAMUSCULAR | Status: AC
  Administered 2013-10-14: 4 mg via INTRAVENOUS
  Filled 2013-10-14: qty 2

## 2013-10-14 MED ORDER — HYDROCHLOROTHIAZIDE 12.5 MG PO CAPS
12.5000 mg | ORAL_CAPSULE | Freq: Once | ORAL | Status: AC
Start: 2013-10-14 — End: 2013-10-14
  Administered 2013-10-14: 12.5 mg via ORAL
  Filled 2013-10-14: qty 1

## 2013-10-14 MED ORDER — SODIUM CHLORIDE 0.9 % IV BOLUS (SEPSIS)
1000.0000 mL | Freq: Once | INTRAVENOUS | Status: AC
  Administered 2013-10-14: 1000 mL via INTRAVENOUS

## 2013-10-14 MED ORDER — LISINOPRIL-HYDROCHLOROTHIAZIDE 20-12.5 MG PO TABS: 1.0000 | ORAL_TABLET | Freq: Once | ORAL | Status: DC

## 2013-10-14 NOTE — Discharge Instructions (Signed)
Abdominal Pain, Women °Abdominal (stomach, pelvic, or belly) pain can be caused by many things. It is important to tell your doctor: °· The location of the pain. °· Does it come and go or is it present all the time? °· Are there things that start the pain (eating certain foods, exercise)? °· Are there other symptoms associated with the pain (fever, nausea, vomiting, diarrhea)? °All of this is helpful to know when trying to find the cause of the pain. °CAUSES  °· Stomach: virus or bacteria infection, or ulcer. °· Intestine: appendicitis (inflamed appendix), regional ileitis (Crohn's disease), ulcerative colitis (inflamed colon), irritable bowel syndrome, diverticulitis (inflamed diverticulum of the colon), or cancer of the stomach or intestine. °· Gallbladder disease or stones in the gallbladder. °· Kidney disease, kidney stones, or infection. °· Pancreas infection or cancer. °· Fibromyalgia (pain disorder). °· Diseases of the female organs: °· Uterus: fibroid (non-cancerous) tumors or infection. °· Fallopian tubes: infection or tubal pregnancy. °· Ovary: cysts or tumors. °· Pelvic adhesions (scar tissue). °· Endometriosis (uterus lining tissue growing in the pelvis and on the pelvic organs). °· Pelvic congestion syndrome (female organs filling up with blood just before the menstrual period). °· Pain with the menstrual period. °· Pain with ovulation (producing an egg). °· Pain with an IUD (intrauterine device, birth control) in the uterus. °· Cancer of the female organs. °· Functional pain (pain not caused by a disease, may improve without treatment). °· Psychological pain. °· Depression. °DIAGNOSIS  °Your doctor will decide the seriousness of your pain by doing an examination. °· Blood tests. °· X-rays. °· Ultrasound. °· CT scan (computed tomography, special type of X-ray). °· MRI (magnetic resonance imaging). °· Cultures, for infection. °· Barium enema (dye inserted in the large intestine, to better view it with  X-rays). °· Colonoscopy (looking in intestine with a lighted tube). °· Laparoscopy (minor surgery, looking in abdomen with a lighted tube). °· Major abdominal exploratory surgery (looking in abdomen with a large incision). °TREATMENT  °The treatment will depend on the cause of the pain.  °· Many cases can be observed and treated at home. °· Over-the-counter medicines recommended by your caregiver. °· Prescription medicine. °· Antibiotics, for infection. °· Birth control pills, for painful periods or for ovulation pain. °· Hormone treatment, for endometriosis. °· Nerve blocking injections. °· Physical therapy. °· Antidepressants. °· Counseling with a psychologist or psychiatrist. °· Minor or major surgery. °HOME CARE INSTRUCTIONS  °· Do not take laxatives, unless directed by your caregiver. °· Take over-the-counter pain medicine only if ordered by your caregiver. Do not take aspirin because it can cause an upset stomach or bleeding. °· Try a clear liquid diet (broth or water) as ordered by your caregiver. Slowly move to a bland diet, as tolerated, if the pain is related to the stomach or intestine. °· Have a thermometer and take your temperature several times a day, and record it. °· Bed rest and sleep, if it helps the pain. °· Avoid sexual intercourse, if it causes pain. °· Avoid stressful situations. °· Keep your follow-up appointments and tests, as your caregiver orders. °· If the pain does not go away with medicine or surgery, you may try: °· Acupuncture. °· Relaxation exercises (yoga, meditation). °· Group therapy. °· Counseling. °SEEK MEDICAL CARE IF:  °· You notice certain foods cause stomach pain. °· Your home care treatment is not helping your pain. °· You need stronger pain medicine. °· You want your IUD removed. °· You feel faint or   lightheaded. °· You develop nausea and vomiting. °· You develop a rash. °· You are having side effects or an allergy to your medicine. °SEEK IMMEDIATE MEDICAL CARE IF:  °· Your  pain does not go away or gets worse. °· You have a fever. °· Your pain is felt only in portions of the abdomen. The right side could possibly be appendicitis. The left lower portion of the abdomen could be colitis or diverticulitis. °· You are passing blood in your stools (bright red or black tarry stools, with or without vomiting). °· You have blood in your urine. °· You develop chills, with or without a fever. °· You pass out. °MAKE SURE YOU:  °· Understand these instructions. °· Will watch your condition. °· Will get help right away if you are not doing well or get worse. °Document Released: 04/24/2007 Document Revised: 09/19/2011 Document Reviewed: 05/14/2009 °[employer]® Patient Information ©2014 [employer]. ° °

## 2013-10-14 NOTE — ED Notes (Signed)
Pt vomiting, dry heaves.

## 2013-10-14 NOTE — ED Notes (Signed)
Pt crying tearful, restless in bed.

## 2013-10-14 NOTE — ED Notes (Signed)
Pt's family member is picking her up.

## 2013-10-14 NOTE — ED Provider Notes (Signed)
CSN: [id number]     Arrival date & time 10/14/13  1519 History   First MD Initiated Contact with Patient 10/14/13 1704     Chief Complaint  Patient presents with  . Abdominal Pain   Patient is a 43 y.o. female presenting with flank pain.  Flank Pain This is a new problem. The current episode started today. The problem occurs constantly. The problem has been gradually worsening. Associated symptoms include abdominal pain, anorexia, diaphoresis, nausea and vomiting. Pertinent negatives include no chest pain, chills, congestion, coughing, fatigue, fever, headaches, neck pain, numbness, rash, urinary symptoms or weakness. Nothing aggravates the symptoms. She has tried nothing for the symptoms.   It radiates to her RLQ and right inguinal area.  It is stabbing, colicky in nature, waxes and wanes.  No history of kidney stone.  No hematuria.    Past Medical History  Diagnosis Date  . Coronary artery disease   . Hypertension   . CHF (congestive heart failure)    History reviewed. No pertinent past surgical history. History reviewed. No pertinent family history. History  Substance Use Topics  . Smoking status: Current Every Day Smoker  . Smokeless tobacco: Not on file  . Alcohol Use: No   OB History   Grav Para Term Preterm Abortions TAB SAB Ect Mult Living                 Review of Systems  Constitutional: Positive for diaphoresis. Negative for fever, chills and fatigue.  HENT: Negative for congestion and rhinorrhea.   Respiratory: Negative for cough, shortness of breath and wheezing.   Cardiovascular: Negative for chest pain and leg swelling.  Gastrointestinal: Positive for nausea, vomiting, abdominal pain and anorexia. Negative for diarrhea.  Genitourinary: Positive for flank pain. Negative for dysuria, urgency, frequency, vaginal bleeding, vaginal discharge and difficulty urinating.  Musculoskeletal: Negative for neck pain and neck stiffness.  Skin: Negative for rash.  Neurological:  Negative for weakness, numbness and headaches.  All other systems reviewed and are negative.      Allergies  Review of patient's allergies indicates no known allergies.  Home Medications   Current Outpatient Rx  Name  Route  Sig  Dispense  Refill  . lisinopril-hydrochlorothiazide (PRINZIDE) 20-12.5 MG per tablet   Oral   Take 1 tablet by mouth daily.   30 tablet   0   . naproxen sodium (ANAPROX) 220 MG tablet   Oral   Take 440 mg by mouth daily as needed (for pain).         Marland Kitchen oxyCODONE-acetaminophen (PERCOCET/ROXICET) 5-325 MG per tablet   Oral   Take 1-2 tablets by mouth every 4 (four) hours as needed for severe pain.   15 tablet   0    BP 157/121  Pulse 75  Temp(Src) 97.9 F (36.6 C) (Oral)  Resp 18  Ht 5\' 8"  (1.727 m)  Wt 199 lb (90.266 kg)  BMI 30.26 kg/m2  SpO2 100%  LMP 09/27/2013 Physical Exam  Nursing note and vitals reviewed. Constitutional: She is oriented to person, place, and time. She appears well-developed and well-nourished. She appears distressed.  Writhing on exam  HENT:  Head: Normocephalic and atraumatic.  Mouth/Throat: Oropharynx is clear and moist.  Eyes: Conjunctivae and EOM are normal. Pupils are equal, round, and reactive to light. No scleral icterus.  Neck: Normal range of motion. Neck supple.  Cardiovascular: Regular rhythm, normal heart sounds and intact distal pulses.  Exam reveals no gallop and no friction rub.  No murmur heard. tachycardia  Pulmonary/Chest: Breath sounds normal. No respiratory distress. She has no wheezes.  Abdominal: Soft. Normal appearance and bowel sounds are normal. She exhibits no distension and no ascites. There is tenderness in the right lower quadrant. There is CVA tenderness. There is no rigidity, no rebound, no guarding, no tenderness at McBurney's point and negative Murphy's sign. No hernia. Hernia confirmed negative in the right inguinal area and confirmed negative in the left inguinal area.   Genitourinary: Vagina normal. There is no rash, tenderness or lesion on the right labia. There is no rash, tenderness or lesion on the left labia. Cervix exhibits no motion tenderness, no discharge and no friability. Right adnexum displays no mass, no tenderness and no fullness. Left adnexum displays no mass, no tenderness and no fullness. No tenderness or bleeding around the vagina. No foreign body around the vagina. No signs of injury around the vagina. No vaginal discharge found.  Musculoskeletal: She exhibits no edema.  Neurological: She is alert and oriented to person, place, and time. No cranial nerve deficit. She exhibits normal muscle tone. Coordination normal.  Skin: Skin is warm. No rash noted. She is diaphoretic.  Psychiatric: She has a normal mood and affect.    ED Course  Procedures (including critical care time) Labs Review Labs Reviewed  CBC WITH DIFFERENTIAL - Abnormal; Notable for the following:    WBC 11.6 (*)    Neutrophils Relative % 81 (*)    Neutro Abs 9.5 (*)    Lymphocytes Relative 10 (*)    All other components within normal limits  COMPREHENSIVE METABOLIC PANEL - Abnormal; Notable for the following:    Glucose, Bld 114 (*)    Total Protein 9.4 (*)    GFR calc non Af Amer 64 (*)    GFR calc Af Amer 74 (*)    All other components within normal limits  URINALYSIS, ROUTINE W REFLEX MICROSCOPIC - Abnormal; Notable for the following:    Glucose, UA 100 (*)    Hgb urine dipstick SMALL (*)    Ketones, ur 15 (*)    Protein, ur >300 (*)    All other components within normal limits  LIPASE, BLOOD  TROPONIN I  URINE MICROSCOPIC-ADD ON  POC URINE PREG, ED  I-STAT TROPOININ, ED   Imaging Review Ct Abdomen Pelvis W Contrast  10/14/2013   CLINICAL DATA:  Right lower quadrant pain/tenderness. Rule out stone.  EXAM: CT ABDOMEN AND PELVIS WITH CONTRAST  TECHNIQUE: Multidetector CT imaging of the abdomen and pelvis was performed using the standard protocol following bolus  administration of intravenous contrast.  CONTRAST:  122mL OMNIPAQUE IOHEXOL 300 MG/ML  SOLN  COMPARISON:  12/30/2009  FINDINGS: Lung bases are normal.  Abdominal images demonstrate a normal spleen, pancreas, gallbladder and adrenal glands. There is a sub cm hypodensity over the right lobe of the liver likely a cyst or hemangioma. The appendix is within normal. There is minimal colonic diverticulosis without active inflammation.  Kidneys are normal in size, shape and position demonstrate multiple bilateral stones. The largest stone over the right kidney measures approximately 3 mm over the mid pole. The largest left renal stone is over the mid to lower pole measuring 6-7 mm. Left ureter is normal. There is no left hydronephrosis. There is mild dilatation of the right intrarenal collecting system and ureter. There is a mild amount of right perinephric fluid. No definite right ureteral stone is identified.  Pelvic images demonstrate surgical clips likely due to prior  bilateral tubal ligation. The bladder and uterus are unremarkable. There is a small amount of free pelvic fluid present. There are mild degenerative changes of the spine, sacroiliac joints and hips.  IMPRESSION: Bilateral nephrolithiasis. Mild dilatation of the right intrarenal collecting system and ureter with right perinephric fluid. No definite right ureteral stone. Findings may be due to recent passage of a stone and less likely infection.  Mild diverticulosis of the colon.  Subcentimeter liver hypodensities likely a small cyst or hemangioma.  Minimal free pelvic fluid.   Electronically Signed   By: Marin Olp M.D.   On: 10/14/2013 20:31     EKG Interpretation   Date/Time:  Monday October 14 2013 15:52:38 EDT Ventricular Rate:  100 PR Interval:  156 QRS Duration: 96 QT Interval:  366 QTC Calculation: 472 R Axis:   78 Text Interpretation:  Normal sinus rhythm Right atrial enlargement  Anterior infarct , age undetermined Abnormal ECG No  significant change  since last tracing Confirmed by CAMPOS  MD, Lennette Bihari ([id number]) on 10/14/2013  3:56:44 PM      MDM   43 year old female presents complaining of right flank pain radiating to her right lower [place] in what right inguinal region. It is stabbing in nature, colicky. She's had to 3 episodes of nonbloody nonbilious emesis. She is writhing on the bed. She is extremely hypertensive to 250s over 140s. She is tachycardic. She states she's not taking her blood pressure medicine. She does not have a history of abdominal aneurysm. She has never had a kidney stone. She's never had any abdominal surgery other than tubal ligation. Starting any vaginal discharge or bleeding. No hematuria. No urinary symptoms.  Current concern is for kidney stone, however she is significant tender right lower corner therefore obtained scan with contrast to evaluate for stone but rule out appendicitis. I gave her IV Dilaudid, several doses with gradual improvement in her symptoms. She was given Zofran and fluids.  CT scan demonstrates normal appendix. She has bilateral nephrolithiasis, dilatation of the right collecting system and ureter and some perinephric fluid with no definite stone given the limitation of a contrasted study. Radiology feels that this is most consistent with a recently passed stone. She has no abnormal findings her pelvis other than some minimal pelvic fluid. Her pelvic exam was essentially unremarkable. She had no adnexal tenderness or cervical motion tenderness or discharge or bleeding.  I do not suspect that she has an occult intra-abdominal surgical process. I do not suspect ovarian torsion. I feel that she can be discharged home with management for kidney stone. She is given a discharge prescription for Percocet. She is to follow up with her primary care physician for reevaluation. I wrote a prescription for her blood pressure medicine that she is out of. Her blood pressure improved with pain  control. She was pain-free on discharge and her exam x4 in the emergency department was also unchanged.  Final diagnoses:  Abdominal pain  Kidney stone       Wendall Papa, MD 10/15/13 [id number]

## 2013-10-14 NOTE — ED Notes (Signed)
Pt reports right sided abd pain that started this afternoon. Reports some nausea and vomiting with the pain. Denies any urinary symptoms.

## 2013-10-14 NOTE — ED Notes (Signed)
Pt taken to CT.

## 2013-10-14 NOTE — ED Notes (Signed)
MD informed of pt's pain and BP.

## 2013-10-14 NOTE — ED Notes (Signed)
CT made aware pt finished with cup of contrast.

## 2013-10-14 NOTE — ED Notes (Signed)
MD at bedside. 

## 2013-10-21 NOTE — ED Provider Notes (Signed)
I saw and evaluated the patient, reviewed the resident's note and I agree with the findings and plan.   EKG Interpretation   Date/Time:  Monday October 14 2013 15:52:38 EDT Ventricular Rate:  100 PR Interval:  156 QRS Duration: 96 QT Interval:  366 QTC Calculation: 472 R Axis:   78 Text Interpretation:  Normal sinus rhythm Right atrial enlargement  Anterior infarct , age undetermined Abnormal ECG No significant change  since last tracing Confirmed by CAMPOS  MD, Lennette Bihari ([id number]) on 10/14/2013  3:56:44 PM      Results for orders placed during the hospital encounter of 10/14/13  CBC WITH DIFFERENTIAL      Result Value Ref Range   WBC 11.6 (*) 4.0 - 10.5 K/uL   RBC 5.03  3.87 - 5.11 MIL/uL   Hemoglobin 14.3  12.0 - 15.0 g/dL   HCT 41.6  36.0 - 46.0 %   MCV 82.7  78.0 - 100.0 fL   MCH 28.4  26.0 - 34.0 pg   MCHC 34.4  30.0 - 36.0 g/dL   RDW 14.8  11.5 - 15.5 %   Platelets 302  150 - 400 K/uL   Neutrophils Relative % 81 (*) 43 - 77 %   Neutro Abs 9.5 (*) 1.7 - 7.7 K/uL   Lymphocytes Relative 10 (*) 12 - 46 %   Lymphs Abs 1.2  0.7 - 4.0 K/uL   Monocytes Relative 8  3 - 12 %   Monocytes Absolute 0.9  0.1 - 1.0 K/uL   Eosinophils Relative 1  0 - 5 %   Eosinophils Absolute 0.1  0.0 - 0.7 K/uL   Basophils Relative 0  0 - 1 %   Basophils Absolute 0.0  0.0 - 0.1 K/uL  COMPREHENSIVE METABOLIC PANEL      Result Value Ref Range   Sodium 139  137 - 147 mEq/L   Potassium 3.7  3.7 - 5.3 mEq/L   Chloride 98  96 - 112 mEq/L   CO2 22  19 - 32 mEq/L   Glucose, Bld 114 (*) 70 - 99 mg/dL   BUN 19  6 - 23 mg/dL   Creatinine, Ser 1.05  0.50 - 1.10 mg/dL   Calcium 9.8  8.4 - 10.5 mg/dL   Total Protein 9.4 (*) 6.0 - 8.3 g/dL   Albumin 4.4  3.5 - 5.2 g/dL   AST 30  0 - 37 U/L   ALT 14  0 - 35 U/L   Alkaline Phosphatase 69  39 - 117 U/L   Total Bilirubin 0.8  0.3 - 1.2 mg/dL   GFR calc non Af Amer 64 (*) >90 mL/min   GFR calc Af Amer 74 (*) >90 mL/min  LIPASE, BLOOD      Result Value Ref Range    Lipase 16  11 - 59 U/L  URINALYSIS, ROUTINE W REFLEX MICROSCOPIC      Result Value Ref Range   Color, Urine YELLOW  YELLOW   APPearance CLEAR  CLEAR   Specific Gravity, Urine 1.020  1.005 - 1.030   pH 7.0  5.0 - 8.0   Glucose, UA 100 (*) NEGATIVE mg/dL   Hgb urine dipstick SMALL (*) NEGATIVE   Bilirubin Urine NEGATIVE  NEGATIVE   Ketones, ur 15 (*) NEGATIVE mg/dL   Protein, ur >300 (*) NEGATIVE mg/dL   Urobilinogen, UA 0.2  0.0 - 1.0 mg/dL   Nitrite NEGATIVE  NEGATIVE   Leukocytes, UA NEGATIVE  NEGATIVE  TROPONIN I  Result Value Ref Range   Troponin I <0.30  <0.30 ng/mL  URINE MICROSCOPIC-ADD ON      Result Value Ref Range   Squamous Epithelial / LPF RARE  RARE   WBC, UA 0-2  <3 WBC/hpf   RBC / HPF 0-2  <3 RBC/hpf   Bacteria, UA RARE  RARE  POC URINE PREG, ED      Result Value Ref Range   Preg Test, Ur NEGATIVE  NEGATIVE  I-STAT TROPOININ, ED      Result Value Ref Range   Troponin i, poc 0.01  0.00 - 0.08 ng/mL   Comment 3            Ct Abdomen Pelvis W Contrast  10/14/2013   CLINICAL DATA:  Right lower quadrant pain/tenderness. Rule out stone.  EXAM: CT ABDOMEN AND PELVIS WITH CONTRAST  TECHNIQUE: Multidetector CT imaging of the abdomen and pelvis was performed using the standard protocol following bolus administration of intravenous contrast.  CONTRAST:  155mL OMNIPAQUE IOHEXOL 300 MG/ML  SOLN  COMPARISON:  12/30/2009  FINDINGS: Lung bases are normal.  Abdominal images demonstrate a normal spleen, pancreas, gallbladder and adrenal glands. There is a sub cm hypodensity over the right lobe of the liver likely a cyst or hemangioma. The appendix is within normal. There is minimal colonic diverticulosis without active inflammation.  Kidneys are normal in size, shape and position demonstrate multiple bilateral stones. The largest stone over the right kidney measures approximately 3 mm over the mid pole. The largest left renal stone is over the mid to lower pole measuring 6-7 mm.  Left ureter is normal. There is no left hydronephrosis. There is mild dilatation of the right intrarenal collecting system and ureter. There is a mild amount of right perinephric fluid. No definite right ureteral stone is identified.  Pelvic images demonstrate surgical clips likely due to prior bilateral tubal ligation. The bladder and uterus are unremarkable. There is a small amount of free pelvic fluid present. There are mild degenerative changes of the spine, sacroiliac joints and hips.  IMPRESSION: Bilateral nephrolithiasis. Mild dilatation of the right intrarenal collecting system and ureter with right perinephric fluid. No definite right ureteral stone. Findings may be due to recent passage of a stone and less likely infection.  Mild diverticulosis of the colon.  Subcentimeter liver hypodensities likely a small cyst or hemangioma.  Minimal free pelvic fluid.   Electronically Signed   By: Marin Olp M.D.   On: 10/14/2013 20:31    Patient seen by me. Based on CT scan most likely patient had a ureteral stone that has recently passed. Patient can be discharged home. Do not feel she has an acute surgical abdominal process. Some limitations on CT scan because it was done with contrast which was appropriate time of ordering it. However radiology does believe that most likely represents recent passage of a ureteral stone. Patient has bilateral nephrolithiasis.  Mervin Kung, MD 10/21/13 [id number]

## 2014-04-03 ENCOUNTER — Encounter (HOSPITAL_COMMUNITY): Payer: Self-pay | Admitting: Emergency Medicine

## 2014-04-03 ENCOUNTER — Emergency Department (HOSPITAL_COMMUNITY)
Admission: EM | Admit: 2014-04-03 | Discharge: 2014-04-03 | Disposition: A | Discharge status: Home / Self Care | Payer: Self-pay | Attending: Emergency Medicine | Admitting: Emergency Medicine

## 2014-04-03 DIAGNOSIS — R1032 Left lower quadrant pain: Secondary | ICD-10-CM | POA: Insufficient documentation

## 2014-04-03 DIAGNOSIS — F17200 Nicotine dependence, unspecified, uncomplicated: Secondary | ICD-10-CM | POA: Insufficient documentation

## 2014-04-03 DIAGNOSIS — I2510 Atherosclerotic heart disease of native coronary artery without angina pectoris: Secondary | ICD-10-CM | POA: Insufficient documentation

## 2014-04-03 DIAGNOSIS — I509 Heart failure, unspecified: Secondary | ICD-10-CM | POA: Insufficient documentation

## 2014-04-03 DIAGNOSIS — N898 Other specified noninflammatory disorders of vagina: Secondary | ICD-10-CM | POA: Insufficient documentation

## 2014-04-03 DIAGNOSIS — I10 Essential (primary) hypertension: Secondary | ICD-10-CM | POA: Insufficient documentation

## 2014-04-03 DIAGNOSIS — Z3202 Encounter for pregnancy test, result negative: Secondary | ICD-10-CM | POA: Insufficient documentation

## 2014-04-03 DIAGNOSIS — N939 Abnormal uterine and vaginal bleeding, unspecified: Secondary | ICD-10-CM

## 2014-04-03 DIAGNOSIS — Z79899 Other long term (current) drug therapy: Secondary | ICD-10-CM | POA: Insufficient documentation

## 2014-04-03 LAB — URINALYSIS, ROUTINE W REFLEX MICROSCOPIC
BILIRUBIN URINE: NEGATIVE
GLUCOSE, UA: NEGATIVE mg/dL
Ketones, ur: NEGATIVE mg/dL
Leukocytes, UA: NEGATIVE
Nitrite: NEGATIVE
PROTEIN: NEGATIVE mg/dL
Specific Gravity, Urine: 1.014 (ref 1.005–1.030)
UROBILINOGEN UA: 0.2 mg/dL (ref 0.0–1.0)
pH: 6.5 (ref 5.0–8.0)

## 2014-04-03 LAB — URINE MICROSCOPIC-ADD ON

## 2014-04-03 LAB — COMPREHENSIVE METABOLIC PANEL
ALT: 20 U/L (ref 0–35)
AST: 23 U/L (ref 0–37)
Albumin: 3.6 g/dL (ref 3.5–5.2)
Alkaline Phosphatase: 69 U/L (ref 39–117)
Anion gap: 10 (ref 5–15)
BUN: 15 mg/dL (ref 6–23)
CALCIUM: 9.3 mg/dL (ref 8.4–10.5)
CHLORIDE: 103 meq/L (ref 96–112)
CO2: 27 mEq/L (ref 19–32)
Creatinine, Ser: 1.09 mg/dL (ref 0.50–1.10)
GFR, EST AFRICAN AMERICAN: 70 mL/min — AB (ref >90)
GFR, EST NON AFRICAN AMERICAN: 61 mL/min — AB (ref >90)
GLUCOSE: 85 mg/dL (ref 70–99)
Potassium: 4.2 mEq/L (ref 3.7–5.3)
SODIUM: 140 meq/L (ref 137–147)
Total Bilirubin: 0.4 mg/dL (ref 0.3–1.2)
Total Protein: 7.5 g/dL (ref 6.0–8.3)

## 2014-04-03 LAB — PREGNANCY, URINE: Preg Test, Ur: NEGATIVE

## 2014-04-03 LAB — I-STAT TROPONIN, ED: TROPONIN I, POC: 0.02 ng/mL (ref 0.00–0.08)

## 2014-04-03 LAB — CBC WITH DIFFERENTIAL/PLATELET
BASOS ABS: 0 10*3/uL (ref 0.0–0.1)
Basophils Relative: 0 % (ref 0–1)
EOS PCT: 6 % — AB (ref 0–5)
Eosinophils Absolute: 0.5 10*3/uL (ref 0.0–0.7)
HEMATOCRIT: 38.2 % (ref 36.0–46.0)
Hemoglobin: 12.3 g/dL (ref 12.0–15.0)
LYMPHS ABS: 2.3 10*3/uL (ref 0.7–4.0)
LYMPHS PCT: 27 % (ref 12–46)
MCH: 26.7 pg (ref 26.0–34.0)
MCHC: 32.2 g/dL (ref 30.0–36.0)
MCV: 82.9 fL (ref 78.0–100.0)
MONO ABS: 0.4 10*3/uL (ref 0.1–1.0)
Monocytes Relative: 5 % (ref 3–12)
NEUTROS ABS: 5.3 10*3/uL (ref 1.7–7.7)
Neutrophils Relative %: 62 % (ref 43–77)
Platelets: 298 10*3/uL (ref 150–400)
RBC: 4.61 MIL/uL (ref 3.87–5.11)
RDW: 14.3 % (ref 11.5–15.5)
WBC: 8.5 10*3/uL (ref 4.0–10.5)

## 2014-04-03 MED ORDER — METOPROLOL TARTRATE 1 MG/ML IV SOLN
10.0000 mg | Freq: Once | INTRAVENOUS | Status: AC
  Administered 2014-04-03: 10 mg via INTRAVENOUS
  Filled 2014-04-03: qty 10

## 2014-04-03 MED ORDER — HYDROCHLOROTHIAZIDE 12.5 MG PO TABS: 12.5000 mg | ORAL_TABLET | Freq: Once | ORAL | Status: DC

## 2014-04-03 MED ORDER — LISINOPRIL 20 MG PO TABS: 20.0000 mg | ORAL_TABLET | Freq: Once | ORAL | Status: DC

## 2014-04-03 MED ORDER — LISINOPRIL 20 MG PO TABS
20.0000 mg | ORAL_TABLET | Freq: Once | ORAL | Status: AC
  Administered 2014-04-03: 20 mg via ORAL
  Filled 2014-04-03: qty 1

## 2014-04-03 MED ORDER — OXYCODONE-ACETAMINOPHEN 5-325 MG PO TABS
1.0000 | ORAL_TABLET | Freq: Once | ORAL | Status: AC
  Administered 2014-04-03: 1 via ORAL
  Filled 2014-04-03: qty 1

## 2014-04-03 MED ORDER — HYDROCHLOROTHIAZIDE 25 MG PO TABS
12.5000 mg | ORAL_TABLET | Freq: Once | ORAL | Status: AC
  Administered 2014-04-03: 12.5 mg via ORAL
  Filled 2014-04-03: qty 1

## 2014-04-03 MED ORDER — METOPROLOL TARTRATE 25 MG PO TABS: 25.0000 mg | ORAL_TABLET | Freq: Two times a day (BID) | ORAL | Status: DC

## 2014-04-03 MED ORDER — SODIUM CHLORIDE 0.9 % IV BOLUS (SEPSIS)
1000.0000 mL | Freq: Once | INTRAVENOUS | Status: AC
  Administered 2014-04-03: 1000 mL via INTRAVENOUS

## 2014-04-03 NOTE — ED Notes (Signed)
Pt reports she has a history of hypertension and has been off of her medication for the last few months.

## 2014-04-03 NOTE — ED Provider Notes (Signed)
CSN: [id number]     Arrival date & time 04/03/14  1616 History   First MD Initiated Contact with Patient 04/03/14 2053     Chief Complaint  Patient presents with  . Vaginal Bleeding     (Consider location/radiation/quality/duration/timing/severity/associated sxs/prior Treatment) HPI Comments: Not taking BP meds for some time  Patient is a 43 y.o. female presenting with vaginal bleeding. The history is provided by the patient.  Vaginal Bleeding Quality:  Bright red and clots Severity:  Mild Onset quality:  Gradual Duration:  3 weeks Timing:  Intermittent Progression:  Waxing and waning Chronicity:  New Menstrual history:  Irregular Number of pads used:  5 per day Possible pregnancy: no   Context: at rest   Context: not during intercourse, not genital trauma and not spontaneously   Relieved by:  Nothing Worsened by:  Nothing tried Ineffective treatments:  None tried Associated symptoms: abdominal pain (lower abd midline cramping)   Associated symptoms: no back pain, no dizziness, no dysuria, no fatigue, no fever and no nausea   Risk factors: no bleeding disorder, no gynecological surgery, no prior miscarriage and does not have unprotected sex     Past Medical History  Diagnosis Date  . Coronary artery disease   . Hypertension   . CHF (congestive heart failure)    History reviewed. No pertinent past surgical history. History reviewed. No pertinent family history. History  Substance Use Topics  . Smoking status: Current Every Day Smoker  . Smokeless tobacco: Not on file  . Alcohol Use: No   OB History   Grav Para Term Preterm Abortions TAB SAB Ect Mult Living                 Review of Systems  Constitutional: Negative for fever, activity change, appetite change and fatigue.  HENT: Negative for congestion and rhinorrhea.   Eyes: Negative for discharge, redness and itching.  Respiratory: Negative for shortness of breath and wheezing.   Cardiovascular: Negative for  chest pain.  Gastrointestinal: Positive for abdominal pain (lower abd midline cramping). Negative for nausea, vomiting and diarrhea.  Genitourinary: Positive for vaginal bleeding. Negative for dysuria and hematuria.  Musculoskeletal: Negative for back pain.  Skin: Negative for rash and wound.  Neurological: Negative for dizziness and syncope.      Allergies  Review of patient's allergies indicates no known allergies.  Home Medications   Prior to Admission medications   Medication Sig Start Date End Date Taking? Authorizing Provider  hydrochlorothiazide (HYDRODIURIL) 12.5 MG tablet Take 1 tablet (12.5 mg total) by mouth once. 04/03/14   Sol Passer, MD  lisinopril (PRINIVIL,ZESTRIL) 20 MG tablet Take 1 tablet (20 mg total) by mouth once. 04/03/14   Sol Passer, MD  metoprolol (LOPRESSOR) 25 MG tablet Take 1 tablet (25 mg total) by mouth 2 (two) times daily. 04/03/14   Sol Passer, MD  naproxen sodium (ANAPROX) 220 MG tablet Take 440 mg by mouth daily as needed (for pain).    Historical Provider, MD   BP 229/139  Pulse 92  Temp(Src) 98.4 F (36.9 C) (Oral)  Resp 27  Ht 5\' 7"  (1.702 m)  Wt 198 lb (89.812 kg)  BMI 31.00 kg/m2  SpO2 98% Physical Exam  Constitutional: She is oriented to person, place, and time. She appears well-developed and well-nourished. No distress.  HENT:  Head: Normocephalic and atraumatic.  Mouth/Throat: Oropharynx is clear and moist. No oropharyngeal exudate.  Eyes: Conjunctivae and EOM are normal. Pupils are equal, round, and reactive to  light. Right eye exhibits no discharge. Left eye exhibits no discharge. No scleral icterus.  Neck: Normal range of motion. Neck supple.  Cardiovascular: Normal rate, regular rhythm and normal heart sounds.   No murmur heard. Pulmonary/Chest: Effort normal and breath sounds normal. No respiratory distress. She has no wheezes. She has no rales.  Abdominal: Soft. She exhibits no distension and no mass. Tenderness: mild lower  abd ttp, soft, no rigidity, neg murphy, neg mcburney.  Genitourinary:  Some blood in vault. No lac. Appears to be from cervix. No CMT, no adnexal ttp or fullness  Neurological: She is alert and oriented to person, place, and time. She exhibits normal muscle tone. Coordination normal.  Skin: Skin is warm. No rash noted. She is not diaphoretic.    ED Course  Procedures (including critical care time) Labs Review Labs Reviewed  CBC WITH DIFFERENTIAL - Abnormal; Notable for the following:    Eosinophils Relative 6 (*)    All other components within normal limits  COMPREHENSIVE METABOLIC PANEL - Abnormal; Notable for the following:    GFR calc non Af Amer 61 (*)    GFR calc Af Amer 70 (*)    All other components within normal limits  URINALYSIS, ROUTINE W REFLEX MICROSCOPIC - Abnormal; Notable for the following:    Hgb urine dipstick MODERATE (*)    All other components within normal limits  PREGNANCY, URINE  URINE MICROSCOPIC-ADD ON  I-STAT TROPOININ, ED    Imaging Review No results found.   EKG Interpretation   Date/Time:  Thursday April 03 2014 23:07:42 EDT Ventricular Rate:  82 PR Interval:  227 QRS Duration: 100 QT Interval:  407 QTC Calculation: 475 R Axis:   -62 Text Interpretation:  Sinus rhythm Prolonged PR interval LAE, consider  biatrial enlargement Left anterior fascicular block Anterior infarct, old  Baseline wander in lead(s) V6 PVCs resolved  Confirmed by YAO  MD, DAVID  ([id number]) on 04/03/2014 11:09:56 PM      MDM   MSM: 43 y.o. AAF w/ PMHHx of HTN, w/ cc: of vag bleeding. Occurred for past 3 weeks intermittent. Clots, some  Lower abd cramping. Hx of tubes tied, no other gyn surgery. Irreg periods. No vomiting, no diarrhea, no urinary comlpaints. Has hx of HTN, hasnt been taking meds. Denies HA, visual problems, hearing problems, numbness, weakness, chest pain, abd pain, n/v/d. Afebrile. Hypertensive. Exam as above, some blood and clots in vault. Given home  BP meds and percocet for pain. Bleeding/cramping uterine. No anemia. Not hemorrhaging. Pt will need to see Gyn for formal testing. Regarding BP, related to non compliance. No signs/sxs of end organ damage. Given Lopressor and BP went down to more appropriate level. Will add on Lopressor 25 bid and refill home bp meds. Will need to see PCP for re-evaulation and management of antihypertensive regimen. Discharged  Final diagnoses:  Vaginal bleeding  Essential hypertension   New Prescriptions   HYDROCHLOROTHIAZIDE (HYDRODIURIL) 12.5 MG TABLET    Take 1 tablet (12.5 mg total) by mouth once.   LISINOPRIL (PRINIVIL,ZESTRIL) 20 MG TABLET    Take 1 tablet (20 mg total) by mouth once.   METOPROLOL (LOPRESSOR) 25 MG TABLET    Take 1 tablet (25 mg total) by mouth 2 (two) times daily.   [place]     [place] [phone] Schedule an appointment as soon as possible for a visit   Discharged  Sol Passer, MD 04/03/14 2333

## 2014-04-03 NOTE — ED Notes (Signed)
Pt c/o vaginal bleeding x 3 weeks with clots; pt sts some lower abd cramping

## 2014-04-03 NOTE — ED Notes (Signed)
Pt had 10 sec run of bigeminy, MD aware, pt has had three episodes

## 2014-04-03 NOTE — Discharge Instructions (Signed)
Abnormal Uterine Bleeding Abnormal uterine bleeding can affect women at various stages in life, including teenagers, women in their reproductive years, pregnant women, and women who have reached menopause. Several kinds of uterine bleeding are considered abnormal, including:  Bleeding or spotting between periods.   Bleeding after sexual intercourse.   Bleeding that is heavier or more than normal.   Periods that last longer than usual.  Bleeding after menopause.  Many cases of abnormal uterine bleeding are minor and simple to treat, while others are more serious. Any type of abnormal bleeding should be evaluated by your health care provider. Treatment will depend on the cause of the bleeding. HOME CARE INSTRUCTIONS Monitor your condition for any changes. The following actions may help to alleviate any discomfort you are experiencing:  Avoid the use of tampons and douches as directed by your health care provider.  Change your pads frequently. You should get regular pelvic exams and Pap tests. Keep all follow-up appointments for diagnostic tests as directed by your health care provider.  SEEK MEDICAL CARE IF:   Your bleeding lasts more than 1 week.   You feel dizzy at times.  SEEK IMMEDIATE MEDICAL CARE IF:   You pass out.   You are changing pads every 15 to 30 minutes.   You have abdominal pain.  You have a fever.   You become sweaty or weak.   You are passing large blood clots from the vagina.   You start to feel nauseous and vomit. MAKE SURE YOU:   Understand these instructions.  Will watch your condition.  Will get help right away if you are not doing well or get worse. Document Released: 06/27/2005 Document Revised: 07/02/2013 Document Reviewed: 01/24/2013 Exit[employer] Patient Information 2015 [employer]. This information is not intended to replace advice given to you by your health care provider. Make sure you discuss any questions you have with your  health care provider.  

## 2014-04-03 NOTE — ED Notes (Signed)
MD Darl Householder aware of patient VS at discharge

## 2014-04-05 NOTE — ED Provider Notes (Signed)
I saw and evaluated the patient, reviewed the resident's note and I agree with the findings and plan.   EKG Interpretation   Date/Time:  Thursday April 03 2014 23:07:42 EDT Ventricular Rate:  82 PR Interval:  227 QRS Duration: 100 QT Interval:  407 QTC Calculation: 475 R Axis:   -62 Text Interpretation:  Sinus rhythm Prolonged PR interval LAE, consider  biatrial enlargement Left anterior fascicular block Anterior infarct, old  Baseline wander in lead(s) V6 PVCs resolved  Confirmed by YAO  MD, DAVID  ([id number]) on 04/03/2014 11:09:56 PM      Emily Key is a 43 y.o. female hx of HTN here with vag bleed. Vag bleed for 3 weeks, associated with lower ab cramping. Has been uncompliant with HTN meds as well. No chest pain or shortness of breath or headaches. On exam, well appearing, hypertensive. Nl neuro exam. Heart, lung exam unremarkable. ? Fibroids on abdomen exam. Pelvic exam by resident showed minimal bleeding. Labs unremarkable. UA showed blood. Has some PVCs on monitor, improved with lopressor. Given PO BP meds and Bp dec from 250 down to 200. Will re prescribe BP meds and add lopressor. I told her to f/u at [facility] and at [facility].    Wandra Arthurs, MD 04/05/14 [phone]

## 2014-11-09 DIAGNOSIS — J189 Pneumonia, unspecified organism: Secondary | ICD-10-CM

## 2014-11-09 HISTORY — DX: Pneumonia, unspecified organism: J18.9

## 2014-11-11 ENCOUNTER — Emergency Department (HOSPITAL_COMMUNITY): Payer: Medicaid Other

## 2014-11-11 ENCOUNTER — Encounter (HOSPITAL_COMMUNITY): Payer: Self-pay | Admitting: Emergency Medicine

## 2014-11-11 ENCOUNTER — Inpatient Hospital Stay (HOSPITAL_COMMUNITY)
Admission: EM | Admit: 2014-11-11 | Discharge: 2014-11-22 | DRG: 163 | Disposition: A | Discharge status: Home / Self Care | Payer: Medicaid Other | Attending: Internal Medicine | Admitting: Internal Medicine

## 2014-11-11 DIAGNOSIS — E876 Hypokalemia: Secondary | ICD-10-CM | POA: Diagnosis not present

## 2014-11-11 DIAGNOSIS — R071 Chest pain on breathing: Secondary | ICD-10-CM | POA: Diagnosis not present

## 2014-11-11 DIAGNOSIS — J189 Pneumonia, unspecified organism: Secondary | ICD-10-CM | POA: Diagnosis not present

## 2014-11-11 DIAGNOSIS — R0789 Other chest pain: Secondary | ICD-10-CM | POA: Insufficient documentation

## 2014-11-11 DIAGNOSIS — Z9114 Patient's other noncompliance with medication regimen: Secondary | ICD-10-CM | POA: Diagnosis present

## 2014-11-11 DIAGNOSIS — J869 Pyothorax without fistula: Secondary | ICD-10-CM | POA: Diagnosis present

## 2014-11-11 DIAGNOSIS — J851 Abscess of lung with pneumonia: Secondary | ICD-10-CM | POA: Diagnosis present

## 2014-11-11 DIAGNOSIS — Z9689 Presence of other specified functional implants: Secondary | ICD-10-CM

## 2014-11-11 DIAGNOSIS — T502X5A Adverse effect of carbonic-anhydrase inhibitors, benzothiadiazides and other diuretics, initial encounter: Secondary | ICD-10-CM | POA: Diagnosis not present

## 2014-11-11 DIAGNOSIS — R079 Chest pain, unspecified: Secondary | ICD-10-CM | POA: Insufficient documentation

## 2014-11-11 DIAGNOSIS — F17210 Nicotine dependence, cigarettes, uncomplicated: Secondary | ICD-10-CM | POA: Diagnosis present

## 2014-11-11 DIAGNOSIS — I2510 Atherosclerotic heart disease of native coronary artery without angina pectoris: Secondary | ICD-10-CM | POA: Diagnosis present

## 2014-11-11 DIAGNOSIS — I10 Essential (primary) hypertension: Secondary | ICD-10-CM

## 2014-11-11 DIAGNOSIS — I5033 Acute on chronic diastolic (congestive) heart failure: Secondary | ICD-10-CM | POA: Diagnosis not present

## 2014-11-11 DIAGNOSIS — N179 Acute kidney failure, unspecified: Secondary | ICD-10-CM | POA: Diagnosis not present

## 2014-11-11 DIAGNOSIS — R0781 Pleurodynia: Secondary | ICD-10-CM | POA: Diagnosis not present

## 2014-11-11 DIAGNOSIS — J90 Pleural effusion, not elsewhere classified: Secondary | ICD-10-CM | POA: Insufficient documentation

## 2014-11-11 DIAGNOSIS — D3500 Benign neoplasm of unspecified adrenal gland: Secondary | ICD-10-CM | POA: Diagnosis present

## 2014-11-11 DIAGNOSIS — I160 Hypertensive urgency: Secondary | ICD-10-CM | POA: Diagnosis present

## 2014-11-11 DIAGNOSIS — Z9889 Other specified postprocedural states: Secondary | ICD-10-CM | POA: Insufficient documentation

## 2014-11-11 DIAGNOSIS — D649 Anemia, unspecified: Secondary | ICD-10-CM | POA: Diagnosis not present

## 2014-11-11 DIAGNOSIS — Z79899 Other long term (current) drug therapy: Secondary | ICD-10-CM

## 2014-11-11 DIAGNOSIS — M79609 Pain in unspecified limb: Secondary | ICD-10-CM | POA: Diagnosis not present

## 2014-11-11 LAB — CBC
HCT: 40.4 % (ref 36.0–46.0)
HCT: 38 % (ref 36.0–46.0)
Hemoglobin: 13.5 g/dL (ref 12.0–15.0)
Hemoglobin: 12.6 g/dL (ref 12.0–15.0)
MCH: 26.3 pg (ref 26.0–34.0)
MCH: 26.3 pg (ref 26.0–34.0)
MCHC: 33.4 g/dL (ref 30.0–36.0)
MCHC: 33.2 g/dL (ref 30.0–36.0)
MCV: 78.6 fL (ref 78.0–100.0)
MCV: 79.2 fL (ref 78.0–100.0)
PLATELETS: 391 10*3/uL (ref 150–400)
Platelets: 391 10*3/uL (ref 150–400)
RBC: 5.14 MIL/uL — ABNORMAL HIGH (ref 3.87–5.11)
RBC: 4.8 MIL/uL (ref 3.87–5.11)
RDW: 14.1 % (ref 11.5–15.5)
RDW: 14.2 % (ref 11.5–15.5)
WBC: 18.2 10*3/uL — ABNORMAL HIGH (ref 4.0–10.5)
WBC: 17.4 10*3/uL — ABNORMAL HIGH (ref 4.0–10.5)

## 2014-11-11 LAB — RAPID URINE DRUG SCREEN, HOSP PERFORMED
Amphetamines: NOT DETECTED
Barbiturates: NOT DETECTED
Benzodiazepines: NOT DETECTED
Cocaine: NOT DETECTED
Opiates: POSITIVE — AB
Tetrahydrocannabinol: POSITIVE — AB

## 2014-11-11 LAB — PREGNANCY, URINE: Preg Test, Ur: NEGATIVE

## 2014-11-11 LAB — BASIC METABOLIC PANEL
Anion gap: 13 (ref 5–15)
BUN: 9 mg/dL (ref 6–20)
CO2: 25 mmol/L (ref 22–32)
Calcium: 9.2 mg/dL (ref 8.9–10.3)
Chloride: 97 mmol/L — ABNORMAL LOW (ref 101–111)
Creatinine, Ser: 0.88 mg/dL (ref 0.44–1.00)
GFR calc Af Amer: >60 mL/min (ref >60)
GFR calc non Af Amer: >60 mL/min (ref >60)
Glucose, Bld: 119 mg/dL — ABNORMAL HIGH (ref 70–99)
Potassium: 3.3 mmol/L — ABNORMAL LOW (ref 3.5–5.1)
Sodium: 135 mmol/L (ref 135–145)

## 2014-11-11 LAB — D-DIMER, QUANTITATIVE: D-Dimer, Quant: 2.61 ug/mL-FEU — ABNORMAL HIGH (ref 0.00–0.48)

## 2014-11-11 LAB — I-STAT TROPONIN, ED
Troponin i, poc: 0 ng/mL (ref 0.00–0.08)
Troponin i, poc: 0.01 ng/mL (ref 0.00–0.08)

## 2014-11-11 LAB — CREATININE, SERUM
Creatinine, Ser: 0.94 mg/dL (ref 0.44–1.00)
GFR calc non Af Amer: >60 mL/min (ref >60)

## 2014-11-11 MED ORDER — HYDRALAZINE HCL 20 MG/ML IJ SOLN
10.0000 mg | INTRAMUSCULAR | Status: DC | PRN
  Administered 2014-11-11 – 2014-11-12 (×2): 10 mg via INTRAVENOUS
  Filled 2014-11-11 (×3): qty 1

## 2014-11-11 MED ORDER — POTASSIUM CHLORIDE 20 MEQ/15ML (10%) PO SOLN
20.0000 meq | Freq: Once | ORAL | Status: AC
  Administered 2014-11-11: 20 meq via ORAL
  Filled 2014-11-11: qty 30

## 2014-11-11 MED ORDER — LORAZEPAM 1 MG PO TABS
0.5000 mg | ORAL_TABLET | Freq: Once | ORAL | Status: AC
  Administered 2014-11-11: 0.5 mg via ORAL
  Filled 2014-11-11: qty 1

## 2014-11-11 MED ORDER — AMLODIPINE BESYLATE 5 MG PO TABS
5.0000 mg | ORAL_TABLET | Freq: Every day | ORAL | Status: DC
  Administered 2014-11-11: 5 mg via ORAL
  Filled 2014-11-11: qty 1

## 2014-11-11 MED ORDER — DEXTROSE 5 % IV SOLN
1.0000 g | Freq: Once | INTRAVENOUS | Status: AC
  Administered 2014-11-11: 1 g via INTRAVENOUS
  Filled 2014-11-11: qty 10

## 2014-11-11 MED ORDER — ACETAMINOPHEN 650 MG RE SUPP: 650.0000 mg | Freq: Four times a day (QID) | RECTAL | Status: DC | PRN

## 2014-11-11 MED ORDER — HYDROCHLOROTHIAZIDE 12.5 MG PO CAPS
12.5000 mg | ORAL_CAPSULE | Freq: Once | ORAL | Status: AC
  Administered 2014-11-11: 12.5 mg via ORAL
  Filled 2014-11-11: qty 1

## 2014-11-11 MED ORDER — SODIUM CHLORIDE 0.9 % IV BOLUS (SEPSIS)
1000.0000 mL | Freq: Once | INTRAVENOUS | Status: AC
  Administered 2014-11-11: 1000 mL via INTRAVENOUS

## 2014-11-11 MED ORDER — ONDANSETRON HCL 4 MG PO TABS: 4.0000 mg | ORAL_TABLET | Freq: Four times a day (QID) | ORAL | Status: DC | PRN

## 2014-11-11 MED ORDER — ACETAMINOPHEN 325 MG PO TABS
650.0000 mg | ORAL_TABLET | Freq: Four times a day (QID) | ORAL | Status: DC | PRN
  Administered 2014-11-12: 650 mg via ORAL
  Filled 2014-11-11: qty 2

## 2014-11-11 MED ORDER — ALBUTEROL SULFATE (2.5 MG/3ML) 0.083% IN NEBU
5.0000 mg | INHALATION_SOLUTION | Freq: Once | RESPIRATORY_TRACT | Status: AC
  Administered 2014-11-11: 5 mg via RESPIRATORY_TRACT
  Filled 2014-11-11: qty 6

## 2014-11-11 MED ORDER — ASPIRIN 81 MG PO CHEW
324.0000 mg | CHEWABLE_TABLET | Freq: Once | ORAL | Status: AC
  Administered 2014-11-11: 324 mg via ORAL
  Filled 2014-11-11: qty 4

## 2014-11-11 MED ORDER — TRAMADOL HCL 50 MG PO TABS
50.0000 mg | ORAL_TABLET | Freq: Four times a day (QID) | ORAL | Status: DC | PRN
  Administered 2014-11-11 – 2014-11-13 (×4): 50 mg via ORAL
  Filled 2014-11-11 (×4): qty 1

## 2014-11-11 MED ORDER — HYDROMORPHONE HCL 1 MG/ML IJ SOLN
1.0000 mg | Freq: Once | INTRAMUSCULAR | Status: AC
  Administered 2014-11-11: 1 mg via INTRAVENOUS
  Filled 2014-11-11: qty 1

## 2014-11-11 MED ORDER — METOPROLOL TARTRATE 25 MG PO TABS
25.0000 mg | ORAL_TABLET | Freq: Once | ORAL | Status: AC
  Administered 2014-11-11: 25 mg via ORAL
  Filled 2014-11-11: qty 1

## 2014-11-11 MED ORDER — DEXTROSE 5 % IV SOLN
500.0000 mg | Freq: Once | INTRAVENOUS | Status: DC
  Administered 2014-11-11: 500 mg via INTRAVENOUS
  Filled 2014-11-11: qty 500

## 2014-11-11 MED ORDER — ENOXAPARIN SODIUM 40 MG/0.4ML ~~LOC~~ SOLN
40.0000 mg | SUBCUTANEOUS | Status: DC
  Administered 2014-11-11 – 2014-11-13 (×3): 40 mg via SUBCUTANEOUS
  Filled 2014-11-11 (×5): qty 0.4

## 2014-11-11 MED ORDER — AZITHROMYCIN 500 MG IV SOLR
500.0000 mg | INTRAVENOUS | Status: DC
  Filled 2014-11-11 (×2): qty 500

## 2014-11-11 MED ORDER — CEFTRIAXONE SODIUM IN DEXTROSE 20 MG/ML IV SOLN
1.0000 g | INTRAVENOUS | Status: DC
  Administered 2014-11-12: 1 g via INTRAVENOUS
  Filled 2014-11-11 (×2): qty 50

## 2014-11-11 MED ORDER — SODIUM CHLORIDE 0.9 % IV SOLN: INTRAVENOUS | Status: DC

## 2014-11-11 MED ORDER — ONDANSETRON HCL 4 MG/2ML IJ SOLN: 4.0000 mg | Freq: Four times a day (QID) | INTRAMUSCULAR | Status: DC | PRN

## 2014-11-11 NOTE — ED Notes (Signed)
Pt states that she is supposed to take BP meds but cannot afford them - states that she started a new job so will hopefully be back on them soon.

## 2014-11-11 NOTE — ED Notes (Signed)
Dr. Wright at bedside.

## 2014-11-11 NOTE — ED Notes (Signed)
Patient states she started having squeezing L chest pain that started last Saturday that is intermittent.   Patient states that had SOB, lightheadedness, diaphoresis and nausea with episodes.   Patient states that she continues to have same and wanted to get checked out.

## 2014-11-11 NOTE — H&P (Addendum)
[facility] History and Physical  Emily Key RJJ:[id number] DOB: Dec 08, 1970 DOA: 11/11/2014  Referring physician: Dr. Joya Gaskins. ER physician. PCP: No PCP Per Patient patient does not have a PCP. Specialists: None.  Chief Complaint: Left-sided chest pain.  HPI: Emily Key is a 44 y.o. female with history of hypertension who has not been taking medications due to financial reasons presents to the ER because of left-sided chest pain. Patient states her symptoms started with nonproductive cough 5 days ago and last 3 days has been having left-sided chest pain on deep inspiration. Has been having some diaphoresis. In the ER chest x-ray shows features concerning for left-sided pneumonia and has been admitted for further management. On exam patient is mildly tachypneic and tachycardic. Patient's blood pressure has been also been elevated. Patient denies any recent travel or sick contacts. Denies any nausea vomiting abdominal pain or diarrhea.  Review of Systems: As presented in the history of presenting illness, rest negative.  Past Medical History  Diagnosis Date  . Coronary artery disease   . Hypertension   . CHF (congestive heart failure)    Past Surgical History  Procedure Laterality Date  . Tonsillectomy    . Tubal ligation     Social History:  reports that she has been smoking Cigarettes.  She has been smoking about 0.25 packs per day. She does not have any smokeless tobacco history on file. She reports that she does not drink alcohol or use illicit drugs. Where does patient live home. Can patient participate in ADLs? Yes.  No Known Allergies  Family History:  Family History  Problem Relation Age of Onset  . Diabetes Mellitus II Mother   . Hypertension Mother   . Lung cancer Father       Prior to Admission medications   Medication Sig Start Date End Date Taking? Authorizing Provider  naproxen sodium (ANAPROX) 220 MG tablet Take 440 mg by mouth daily as needed (for  pain).   Yes Historical Provider, MD  hydrochlorothiazide (HYDRODIURIL) 12.5 MG tablet Take 1 tablet (12.5 mg total) by mouth once. 04/03/14   Sol Passer, MD  lisinopril (PRINIVIL,ZESTRIL) 20 MG tablet Take 1 tablet (20 mg total) by mouth once. 04/03/14   Sol Passer, MD  metoprolol (LOPRESSOR) 25 MG tablet Take 1 tablet (25 mg total) by mouth 2 (two) times daily. 04/03/14   Sol Passer, MD    Physical Exam: Filed Vitals:   11/11/14 1845 11/11/14 1915 11/11/14 1945 11/11/14 2046  BP: 184/98 190/103 199/112 205/112  Pulse: 97 87 85 90  Temp:    97.9 F (36.6 C)  TempSrc:    Oral  Resp: 40 30 30 23   Height:    5\' 8"  (1.727 m)  Weight:    93.033 kg (205 lb 1.6 oz)  SpO2: 93% 91% 98% 100%     General:  Moderately built and nourished.  Eyes: Anicteric no pallor.  ENT: No discharge from ears eyes nose or mouth.  Neck: No mass felt. No JVD appreciated.  Cardiovascular: S1-S2 heard.  Respiratory: No rhonchi or crepitations.  Abdomen: Soft nontender bowel sounds present.  Skin: No rash.  Musculoskeletal: No edema.  Psychiatric: Appears normal.  Neurologic: Alert awake oriented to time place and person. Moves all extremities.  Labs on Admission:  Basic Metabolic Panel:  Recent Labs Lab 11/11/14 1635  NA 135  K 3.3*  CL 97*  CO2 25  GLUCOSE 119*  BUN 9  CREATININE 0.88  CALCIUM 9.2   Liver Function  Tests: No results for input(s): AST, ALT, ALKPHOS, BILITOT, PROT, ALBUMIN in the last 168 hours. No results for input(s): LIPASE, AMYLASE in the last 168 hours. No results for input(s): AMMONIA in the last 168 hours. CBC:  Recent Labs Lab 11/11/14 1635  WBC 18.2*  HGB 13.5  HCT 40.4  MCV 78.6  PLT 391   Cardiac Enzymes: No results for input(s): CKTOTAL, CKMB, CKMBINDEX, TROPONINI in the last 168 hours.  BNP (last 3 results) No results for input(s): BNP in the last 8760 hours.  ProBNP (last 3 results) No results for input(s): PROBNP in the last 8760  hours.  CBG: No results for input(s): GLUCAP in the last 168 hours.  Radiological Exams on Admission: Dg Chest 2 View  11/11/2014   CLINICAL DATA:  Left-sided chest pain and shortness of breath for 4 days  EXAM: CHEST  2 VIEW  COMPARISON:  June 21, 2013  FINDINGS: There is extensive consolidation throughout the left lower lobe. There is questionable infiltrate in the lingula as well. Right lung is clear. Heart is mildly enlarged with pulmonary vascularity within normal limits. No adenopathy.  IMPRESSION: Extensive left lower lobe airspace consolidation. There may also be consolidation in a portion of the lingula. Right lung is clear. No change in cardiac prominence.   Electronically Signed   By: Lowella Grip III M.D.   On: 11/11/2014 15:48    EKG: Independently reviewed. Sinus tachycardia.  Assessment/Plan Principal Problem:   CAP (community acquired pneumonia) Active Problems:   Hypertensive urgency   Pleuritic chest pain   1. Community acquired pneumonia - at this time patient has been placed on ceftriaxone and Zithromax for community-acquired pneumonia. Patient has considerable left-sided pleuritic-type of chest pain. I have ordered CT angiogram of the chest. Check influenza PCR and urine for Legionella and strep antigen and HIV. 2. Hypertensive urgency - patient states used to be on antihypertensive and has not been taking it since last August. At this time I have placed patient on Norvasc 5 mg with when necessary IV hydralazine and if pregnancy test is negative we Emily keep patient on lisinopril. Emily add HCTZ. 3. Left-sided pleuritic-type of chest pain - cardiac markers have been negative. See #1.  Addendum - patient's CT angiogram of her chest shows large left pleural effusion. I have discussed with on-call pulmonary critical care physician Dr. Leonidas Romberg who Emily be seeing patient in consult.   DVT Prophylaxis Lovenox.  Code Status: Full code.  Family Communication:  Discussed with patient.  Disposition Plan: Admit to inpatient.    Kolten Ryback N. [facility] Pager [phone].  If 7PM-7AM, please contact night-coverage [email or web address] Password [facility] 11/11/2014, 9:43 PM

## 2014-11-11 NOTE — ED Notes (Signed)
Pt placed on 2L [place] - states that it hurts to take deep breaths.

## 2014-11-11 NOTE — ED Provider Notes (Signed)
CSN: [id number]     Arrival date & time 11/11/14  1504 History   First MD Initiated Contact with Patient 11/11/14 1629     Chief Complaint  Patient presents with  . Chest Pain   (Consider location/radiation/quality/duration/timing/severity/associated sxs/prior Treatment) Patient is a 44 y.o. female presenting with cough. The history is provided by the patient. No language interpreter was used.  Cough Cough characteristics:  Non-productive Severity:  Moderate Onset quality:  Gradual Timing:  Constant Progression:  Worsening Chronicity:  New Context: upper respiratory infection   Relieved by:  Nothing Worsened by:  Nothing tried Ineffective treatments:  None tried Associated symptoms: chest pain, chills, myalgias, shortness of breath and wheezing   Associated symptoms: no diaphoresis and no headaches   Risk factors: no recent infection and no recent travel     Past Medical History  Diagnosis Date  . Coronary artery disease   . Hypertension   . CHF (congestive heart failure)    Past Surgical History  Procedure Laterality Date  . Tonsillectomy    . Tubal ligation     No family history on file. History  Substance Use Topics  . Smoking status: Current Every Day Smoker -- 0.25 packs/day    Types: Cigarettes  . Smokeless tobacco: Not on file  . Alcohol Use: No   OB History    No data available     Review of Systems  Constitutional: Positive for chills and fatigue. Negative for diaphoresis.  Respiratory: Positive for cough, chest tightness, shortness of breath and wheezing.   Cardiovascular: Positive for chest pain.  Gastrointestinal: Negative for nausea, vomiting and abdominal pain.  Genitourinary: Negative for dysuria.  Musculoskeletal: Positive for myalgias.  Neurological: Negative for weakness, light-headedness and headaches.  Psychiatric/Behavioral: Negative for confusion.  All other systems reviewed and are negative.    Allergies  Review of patient's  allergies indicates no known allergies.  Home Medications   Prior to Admission medications   Medication Sig Start Date End Date Taking? Authorizing Provider  hydrochlorothiazide (HYDRODIURIL) 12.5 MG tablet Take 1 tablet (12.5 mg total) by mouth once. 04/03/14   Sol Passer, MD  lisinopril (PRINIVIL,ZESTRIL) 20 MG tablet Take 1 tablet (20 mg total) by mouth once. 04/03/14   Sol Passer, MD  metoprolol (LOPRESSOR) 25 MG tablet Take 1 tablet (25 mg total) by mouth 2 (two) times daily. 04/03/14   Sol Passer, MD  naproxen sodium (ANAPROX) 220 MG tablet Take 440 mg by mouth daily as needed (for pain).    Historical Provider, MD   BP 217/114 mmHg  Pulse 101  Temp(Src) 97.5 F (36.4 C) (Oral)  Resp 20  Ht 5\' 8"  (1.727 m)  Wt 199 lb (90.266 kg)  BMI 30.26 kg/m2  SpO2 96%  LMP 11/11/2014   Physical Exam  Constitutional: She is oriented to person, place, and time. She appears well-developed and well-nourished. She appears ill. No distress.  HENT:  Head: Normocephalic and atraumatic.  Nose: Nose normal.  Mouth/Throat: Oropharynx is clear and moist. No oropharyngeal exudate.  Eyes: EOM are normal. Pupils are equal, round, and reactive to light.  Neck: Normal range of motion. Neck supple.  Cardiovascular: Regular rhythm, normal heart sounds and intact distal pulses.  Tachycardia present.   No murmur heard. Pulmonary/Chest: Tachypnea noted. No respiratory distress. She has wheezes. She exhibits tenderness (reproducible chest pain on palpation of left chest wall).  Tachypneic Decreased breath sounds throughout and mild wheezing  Abdominal: Soft. There is no tenderness. There is no rebound  and no guarding.  Musculoskeletal: Normal range of motion. She exhibits no tenderness.  Lymphadenopathy:    She has no cervical adenopathy.  Neurological: She is alert and oriented to person, place, and time. No cranial nerve deficit. Coordination normal.  Skin: Skin is warm and dry. She is not  diaphoretic.  Psychiatric: Her speech is normal and behavior is normal. Judgment and thought content normal. Her mood appears anxious.  Nursing note and vitals reviewed.   ED Course  Procedures (including critical care time) Labs Review Labs Reviewed  BASIC METABOLIC PANEL  Vivian, ED    Imaging Review Dg Chest 2 View  11/11/2014   CLINICAL DATA:  Left-sided chest pain and shortness of breath for 4 days  EXAM: CHEST  2 VIEW  COMPARISON:  June 21, 2013  FINDINGS: There is extensive consolidation throughout the left lower lobe. There is questionable infiltrate in the lingula as well. Right lung is clear. Heart is mildly enlarged with pulmonary vascularity within normal limits. No adenopathy.  IMPRESSION: Extensive left lower lobe airspace consolidation. There may also be consolidation in a portion of the lingula. Right lung is clear. No change in cardiac prominence.   Electronically Signed   By: Lowella Grip III M.D.   On: 11/11/2014 15:48     EKG Interpretation   Date/Time:  Tuesday Nov 11 2014 15:10:59 EDT Ventricular Rate:  106 PR Interval:  170 QRS Duration: 104 QT Interval:  338 QTC Calculation: 448 R Axis:   -41 Text Interpretation:  Sinus tachycardia Right atrial enlargement Left axis  deviation Possible Anterior infarct , age undetermined Abnormal ECG  Confirmed by BEATON  MD, ROBERT ([id number]) on 11/11/2014 4:39:10 PM      MDM   Final diagnoses:  Chest pain  CAP  Noncompliance with medication   Pt is a 44 yo F who presents with cough, SOB, and left sided chest pain x 3 days.  Complains of cough that transitioned to pleuritic chest pain, SOB, and anxiety. Has associated diaphoresis, lightheadedness, and nausea.  No previous hx of similar sx.  Has a hx of CHF after delivery of her last child, but is not controlled on chronic meds in the years since.  Hx of HTN and BP was 200/100+ today.  States she lost her insurance and hasn't had meds since August  time.  Is supposed to be on metoprolol 25 BID, lisinopril 20, and HCTZ 12.5 days.   Very anxious and tearful.  Tachypneic in the 30s but no hypoxia.  Able to slow her breathing down with reassurance, but jumps back up soon after.  Will treat with ativan for anxiety and albuterol nebulizer to try to open her up some.  Given ASA 324 due to hx of CAD and complains of chest pressure, but sx are more consistent with her CXR findings of left sided pneumonia. EKG: Sinus tachy at 106, LAD, no ST elevations/depressions, no T wave changes.   Given NS bolus.  Provided with some of her home meds for BP with metoprolol 25 and HCTZ 12.5 mg.   If BP trends down and her tachypnea improves, will try to treat her as an outpatient for CAP.    Still tachypneic and somewhat ill appearing despite the above treatments.  Given dilaudid.   No improvement in increased breathing rate and shallow breaths.    Will admit to hospital for continued management Given IV azithromycin and rocephin after blood cultures were obtained  Triaged to hospitalist team.  To go to [facility] unit per admitting team's request.    Tori Milks, MD 11/12/14 2419  Leonard Schwartz, MD 11/27/14 [phone]

## 2014-11-11 NOTE — ED Notes (Signed)
Attempted report 

## 2014-11-12 ENCOUNTER — Inpatient Hospital Stay (HOSPITAL_COMMUNITY): Payer: Medicaid Other

## 2014-11-12 ENCOUNTER — Encounter (HOSPITAL_COMMUNITY): Payer: Self-pay | Admitting: Radiology

## 2014-11-12 DIAGNOSIS — J189 Pneumonia, unspecified organism: Secondary | ICD-10-CM

## 2014-11-12 DIAGNOSIS — J90 Pleural effusion, not elsewhere classified: Secondary | ICD-10-CM

## 2014-11-12 DIAGNOSIS — I10 Essential (primary) hypertension: Secondary | ICD-10-CM

## 2014-11-12 DIAGNOSIS — R0781 Pleurodynia: Secondary | ICD-10-CM

## 2014-11-12 LAB — COMPREHENSIVE METABOLIC PANEL
ALT: 10 U/L — ABNORMAL LOW (ref 14–54)
ANION GAP: 14 (ref 5–15)
AST: 14 U/L — ABNORMAL LOW (ref 15–41)
Albumin: 2.7 g/dL — ABNORMAL LOW (ref 3.5–5.0)
Alkaline Phosphatase: 82 U/L (ref 38–126)
BILIRUBIN TOTAL: 0.8 mg/dL (ref 0.3–1.2)
BUN: 9 mg/dL (ref 6–20)
CO2: 25 mmol/L (ref 22–32)
Calcium: 8.8 mg/dL — ABNORMAL LOW (ref 8.9–10.3)
Chloride: 97 mmol/L — ABNORMAL LOW (ref 101–111)
Creatinine, Ser: 0.88 mg/dL (ref 0.44–1.00)
GFR calc Af Amer: >60 mL/min (ref >60)
GFR calc non Af Amer: >60 mL/min (ref >60)
Glucose, Bld: 77 mg/dL (ref 70–99)
POTASSIUM: 3.5 mmol/L (ref 3.5–5.1)
Sodium: 136 mmol/L (ref 135–145)
TOTAL PROTEIN: 7.2 g/dL (ref 6.5–8.1)

## 2014-11-12 LAB — CBC WITH DIFFERENTIAL/PLATELET
Basophils Absolute: 0 10*3/uL (ref 0.0–0.1)
Basophils Relative: 0 % (ref 0–1)
EOS PCT: 0 % (ref 0–5)
Eosinophils Absolute: 0 10*3/uL (ref 0.0–0.7)
HEMATOCRIT: 38.5 % (ref 36.0–46.0)
Hemoglobin: 12.4 g/dL (ref 12.0–15.0)
LYMPHS ABS: 1.5 10*3/uL (ref 0.7–4.0)
LYMPHS PCT: 8 % — AB (ref 12–46)
MCH: 26.3 pg (ref 26.0–34.0)
MCHC: 32.2 g/dL (ref 30.0–36.0)
MCV: 81.6 fL (ref 78.0–100.0)
MONO ABS: 1.6 10*3/uL — AB (ref 0.1–1.0)
MONOS PCT: 9 % (ref 3–12)
Neutro Abs: 15.3 10*3/uL — ABNORMAL HIGH (ref 1.7–7.7)
Neutrophils Relative %: 83 % — ABNORMAL HIGH (ref 43–77)
Platelets: 409 10*3/uL — ABNORMAL HIGH (ref 150–400)
RBC: 4.72 MIL/uL (ref 3.87–5.11)
RDW: 14.6 % (ref 11.5–15.5)
WBC: 18.3 10*3/uL — ABNORMAL HIGH (ref 4.0–10.5)

## 2014-11-12 LAB — LEGIONELLA ANTIGEN, URINE

## 2014-11-12 LAB — INFLUENZA PANEL BY PCR (TYPE A & B)
H1N1FLUPCR: NOT DETECTED
INFLAPCR: NEGATIVE
INFLBPCR: NEGATIVE

## 2014-11-12 LAB — LACTATE DEHYDROGENASE: LDH: 145 U/L (ref 98–192)

## 2014-11-12 LAB — BODY FLUID CELL COUNT WITH DIFFERENTIAL
Lymphs, Fluid: 22 %
MONOCYTE-MACROPHAGE-SEROUS FLUID: 3 % — AB (ref 50–90)
Neutrophil Count, Fluid: 75 % — ABNORMAL HIGH (ref 0–25)
Total Nucleated Cell Count, Fluid: 1493 cu mm — ABNORMAL HIGH (ref 0–1000)

## 2014-11-12 LAB — BRAIN NATRIURETIC PEPTIDE: B NATRIURETIC PEPTIDE 5: 138.6 pg/mL — AB (ref 0.0–100.0)

## 2014-11-12 LAB — PROTEIN, BODY FLUID: TOTAL PROTEIN, FLUID: 4.9 g/dL

## 2014-11-12 LAB — HIV ANTIBODY (ROUTINE TESTING W REFLEX): HIV Screen 4th Generation wRfx: NONREACTIVE

## 2014-11-12 LAB — CHOLESTEROL, TOTAL: CHOLESTEROL: 120 mg/dL (ref 0–200)

## 2014-11-12 LAB — TROPONIN I: TROPONIN I: 0.04 ng/mL — AB (ref <0.031)

## 2014-11-12 LAB — LACTATE DEHYDROGENASE, PLEURAL OR PERITONEAL FLUID: LD, Fluid: 436 U/L — ABNORMAL HIGH (ref 3–23)

## 2014-11-12 LAB — PROTEIN, TOTAL: TOTAL PROTEIN: 7.3 g/dL (ref 6.5–8.1)

## 2014-11-12 LAB — STREP PNEUMONIAE URINARY ANTIGEN: Strep Pneumo Urinary Antigen: NEGATIVE

## 2014-11-12 MED ORDER — AZITHROMYCIN 250 MG PO TABS
500.0000 mg | ORAL_TABLET | Freq: Every day | ORAL | Status: DC
  Administered 2014-11-12: 500 mg via ORAL
  Filled 2014-11-12: qty 2

## 2014-11-12 MED ORDER — METOPROLOL TARTRATE 50 MG PO TABS
50.0000 mg | ORAL_TABLET | Freq: Two times a day (BID) | ORAL | Status: DC
Start: 2014-11-12 — End: 2014-11-14
  Administered 2014-11-12 – 2014-11-13 (×3): 50 mg via ORAL
  Filled 2014-11-12 (×3): qty 1

## 2014-11-12 MED ORDER — POLYETHYLENE GLYCOL 3350 17 G PO PACK
17.0000 g | PACK | Freq: Every day | ORAL | Status: DC
  Administered 2014-11-12 – 2014-11-16 (×3): 17 g via ORAL
  Filled 2014-11-12 (×7): qty 1

## 2014-11-12 MED ORDER — HYDRALAZINE HCL 20 MG/ML IJ SOLN
10.0000 mg | Freq: Four times a day (QID) | INTRAMUSCULAR | Status: DC | PRN
  Administered 2014-11-12: 10 mg via INTRAVENOUS

## 2014-11-12 MED ORDER — DOCUSATE SODIUM 100 MG PO CAPS
100.0000 mg | ORAL_CAPSULE | Freq: Two times a day (BID) | ORAL | Status: DC
  Administered 2014-11-12 – 2014-11-13 (×4): 100 mg via ORAL
  Filled 2014-11-12 (×4): qty 1

## 2014-11-12 MED ORDER — LISINOPRIL 5 MG PO TABS
5.0000 mg | ORAL_TABLET | Freq: Every day | ORAL | Status: DC
  Administered 2014-11-12: 5 mg via ORAL
  Filled 2014-11-12 (×2): qty 1

## 2014-11-12 MED ORDER — HYDROCHLOROTHIAZIDE 12.5 MG PO CAPS
12.5000 mg | ORAL_CAPSULE | Freq: Every day | ORAL | Status: DC
  Administered 2014-11-12 – 2014-11-16 (×4): 12.5 mg via ORAL
  Filled 2014-11-12 (×4): qty 1

## 2014-11-12 MED ORDER — HYDRALAZINE HCL 20 MG/ML IJ SOLN
10.0000 mg | INTRAMUSCULAR | Status: DC | PRN
  Administered 2014-11-13 – 2014-11-15 (×5): 10 mg via INTRAVENOUS
  Filled 2014-11-12 (×5): qty 1

## 2014-11-12 MED ORDER — IOHEXOL 350 MG/ML SOLN
100.0000 mL | Freq: Once | INTRAVENOUS | Status: AC | PRN
  Administered 2014-11-12: 100 mL via INTRAVENOUS

## 2014-11-12 MED ORDER — HYDROMORPHONE HCL 1 MG/ML IJ SOLN
1.0000 mg | INTRAMUSCULAR | Status: DC | PRN
  Administered 2014-11-12 – 2014-11-13 (×2): 1 mg via INTRAVENOUS
  Filled 2014-11-12 (×2): qty 1

## 2014-11-12 MED ORDER — METOPROLOL TARTRATE 25 MG PO TABS
25.0000 mg | ORAL_TABLET | Freq: Two times a day (BID) | ORAL | Status: DC
  Administered 2014-11-12: 25 mg via ORAL
  Filled 2014-11-12: qty 1

## 2014-11-12 MED ORDER — KETOROLAC TROMETHAMINE 30 MG/ML IJ SOLN
30.0000 mg | Freq: Three times a day (TID) | INTRAMUSCULAR | Status: DC
  Administered 2014-11-12 – 2014-11-14 (×6): 30 mg via INTRAVENOUS
  Filled 2014-11-12 (×6): qty 1

## 2014-11-12 MED ORDER — LISINOPRIL 10 MG PO TABS: 10.0000 mg | ORAL_TABLET | Freq: Every day | ORAL | Status: DC

## 2014-11-12 NOTE — Consult Note (Signed)
Reason for Consult:Loculated left pleural effusion Referring Physician: Dr. Berna Bue Emily Key is an 44 y.o. female.  HPI: 44 yo woman with a history of hypertension and tobacco abuse. She also has a history of CHF after her last pregnancy, but denies any history of CAD. She has had a cough and general malaise for about 2 weeks. On Saturday she noted rather abrupt onset of left sided chest pain. It was worse with cough and deep breathing. She developed progressive shortness of breath. She presented to the ED yesterday with worsening symptoms.  She had a CXR and chest CT which showed a large left pleural effusion. Thoracentesis was performed today and 300 ml of fluid was drawn off. She did have some improvement of her dyspnea after the thoracentesis. The fluid is exudative.  Past Medical History  Diagnosis Date  . Coronary artery disease   . Hypertension   . CHF (congestive heart failure)     after last pregnancy    Past Surgical History  Procedure Laterality Date  . Tonsillectomy    . Tubal ligation      Family History  Problem Relation Age of Onset  . Diabetes Mellitus II Mother   . Hypertension Mother   . Lung cancer Father     Social History:  reports that she has been smoking Cigarettes.  She has been smoking about 0.25 packs per day. She does not have any smokeless tobacco history on file. She reports that she does not drink alcohol or use illicit drugs.  Allergies: No Known Allergies  Medications:  Scheduled: . azithromycin  500 mg Oral QHS  . cefTRIAXone (ROCEPHIN)  IV  1 g Intravenous Q24H  . docusate sodium  100 mg Oral BID  . enoxaparin (LOVENOX) injection  40 mg Subcutaneous Q24H  . hydrochlorothiazide  12.5 mg Oral Daily  . ketorolac  30 mg Intravenous 3 times per day  . lisinopril  5 mg Oral Daily  . metoprolol tartrate  25 mg Oral BID  . polyethylene glycol  17 g Oral Daily    Results for orders placed or performed during the hospital encounter of  11/11/14 (from the past 48 hour(s))  Basic metabolic panel     Status: Abnormal   Collection Time: 11/11/14  4:35 PM  Result Value Ref Range   Sodium 135 135 - 145 mmol/L   Potassium 3.3 (L) 3.5 - 5.1 mmol/L   Chloride 97 (L) 101 - 111 mmol/L   CO2 25 22 - 32 mmol/L   Glucose, Bld 119 (H) 70 - 99 mg/dL   BUN 9 6 - 20 mg/dL   Creatinine, Ser 0.88 0.44 - 1.00 mg/dL   Calcium 9.2 8.9 - 10.3 mg/dL   GFR calc non Af Amer >60 >60 mL/min   GFR calc Af Amer >60 >60 mL/min    Comment: (NOTE) The eGFR has been calculated using the CKD EPI equation. This calculation has not been validated in all clinical situations. eGFR's persistently <90 mL/min signify possible Chronic Kidney Disease.    Anion gap 13 5 - 15  CBC     Status: Abnormal   Collection Time: 11/11/14  4:35 PM  Result Value Ref Range   WBC 18.2 (H) 4.0 - 10.5 K/uL   RBC 5.14 (H) 3.87 - 5.11 MIL/uL   Hemoglobin 13.5 12.0 - 15.0 g/dL   HCT 40.4 36.0 - 46.0 %   MCV 78.6 78.0 - 100.0 fL   MCH 26.3 26.0 - 34.0 pg  MCHC 33.4 30.0 - 36.0 g/dL   RDW 14.1 11.5 - 15.5 %   Platelets 391 150 - 400 K/uL  D-dimer, quantitative     Status: Abnormal   Collection Time: 11/11/14  4:35 PM  Result Value Ref Range   D-Dimer, Quant 2.61 (H) 0.00 - 0.48 ug/mL-FEU    Comment:        AT THE INHOUSE ESTABLISHED CUTOFF VALUE OF 0.48 ug/mL FEU, THIS ASSAY HAS BEEN DOCUMENTED IN THE LITERATURE TO HAVE A SENSITIVITY AND NEGATIVE PREDICTIVE VALUE OF AT LEAST 98 TO 99%.  THE TEST RESULT SHOULD BE CORRELATED WITH AN ASSESSMENT OF THE CLINICAL PROBABILITY OF DVT / VTE.   I-stat troponin, ED  (not at [facility], [facility])     Status: None   Collection Time: 11/11/14  5:37 PM  Result Value Ref Range   Troponin i, poc 0.00 0.00 - 0.08 ng/mL   Comment 3            Comment: Due to the release kinetics of cTnI, a negative result within the first hours of the onset of symptoms does not rule out myocardial infarction with certainty. If myocardial infarction is  still suspected, repeat the test at appropriate intervals.   I-Stat Troponin, ED (not at [facility])     Status: None   Collection Time: 11/11/14  8:10 PM  Result Value Ref Range   Troponin i, poc 0.01 0.00 - 0.08 ng/mL   Comment 3            Comment: Due to the release kinetics of cTnI, a negative result within the first hours of the onset of symptoms does not rule out myocardial infarction with certainty. If myocardial infarction is still suspected, repeat the test at appropriate intervals.   Pregnancy, urine     Status: None   Collection Time: 11/11/14 10:41 PM  Result Value Ref Range   Preg Test, Ur NEGATIVE NEGATIVE    Comment:        THE SENSITIVITY OF THIS METHODOLOGY IS >20 mIU/mL.   Urine rapid drug screen (hosp performed)     Status: Abnormal   Collection Time: 11/11/14 10:41 PM  Result Value Ref Range   Opiates POSITIVE (A) NONE DETECTED   Cocaine NONE DETECTED NONE DETECTED   Benzodiazepines NONE DETECTED NONE DETECTED   Amphetamines NONE DETECTED NONE DETECTED   Tetrahydrocannabinol POSITIVE (A) NONE DETECTED   Barbiturates NONE DETECTED NONE DETECTED    Comment:        DRUG SCREEN FOR MEDICAL PURPOSES ONLY.  IF CONFIRMATION IS NEEDED FOR ANY PURPOSE, NOTIFY LAB WITHIN 5 DAYS.        LOWEST DETECTABLE LIMITS FOR URINE DRUG SCREEN Drug Class       Cutoff (ng/mL) Amphetamine      1000 Barbiturate      200 Benzodiazepine   301 Tricyclics       601 Opiates          300 Cocaine          300 THC              50   Legionella antigen, urine     Status: None   Collection Time: 11/11/14 10:41 PM  Result Value Ref Range   Specimen Description URINE, RANDOM    Special Requests NONE    Legionella Antigen, Urine      Negative for Legionella pneumophila serogroup 1  Legionella pneumophila serogroup 1 antigen can be detected in urine within 2 to 3 days of infection and may persist even after treatment. This   assay does not detect other Legionella species or serogroups. Performed at [facility]    Report Status 11/12/2014 FINAL   Strep pneumoniae urinary antigen     Status: None   Collection Time: 11/11/14 10:41 PM  Result Value Ref Range   Strep Pneumo Urinary Antigen NEGATIVE NEGATIVE    Comment:        Infection due to S. pneumoniae cannot be absolutely ruled out since the antigen present may be below the detection limit of the test.   CBC     Status: Abnormal   Collection Time: 11/11/14 10:42 PM  Result Value Ref Range   WBC 17.4 (H) 4.0 - 10.5 K/uL   RBC 4.80 3.87 - 5.11 MIL/uL   Hemoglobin 12.6 12.0 - 15.0 g/dL   HCT 38.0 36.0 - 46.0 %   MCV 79.2 78.0 - 100.0 fL   MCH 26.3 26.0 - 34.0 pg   MCHC 33.2 30.0 - 36.0 g/dL   RDW 14.2 11.5 - 15.5 %   Platelets 391 150 - 400 K/uL  Creatinine, serum     Status: None   Collection Time: 11/11/14 10:42 PM  Result Value Ref Range   Creatinine, Ser 0.94 0.44 - 1.00 mg/dL   GFR calc non Af Amer >60 >60 mL/min   GFR calc Af Amer >60 >60 mL/min    Comment: (NOTE) The eGFR has been calculated using the CKD EPI equation. This calculation has not been validated in all clinical situations. eGFR's persistently <90 mL/min signify possible Chronic Kidney Disease.   HIV antibody     Status: None   Collection Time: 11/11/14 10:42 PM  Result Value Ref Range   HIV Screen 4th Generation wRfx Non Reactive Non Reactive    Comment: (NOTE) Performed At: [facility] [place], [place] [id number] Lindon Romp MD YI:[id number]   Influenza panel by pcr     Status: None   Collection Time: 11/11/14 10:43 PM  Result Value Ref Range   Influenza A By PCR NEGATIVE NEGATIVE   Influenza B By PCR NEGATIVE NEGATIVE   H1N1 flu by pcr NOT DETECTED NOT DETECTED    Comment:        The Xpert Flu assay (FDA approved for nasal aspirates or washes and nasopharyngeal swab specimens), is intended as an aid in the diagnosis  of influenza and should not be used as a sole basis for treatment.   Brain natriuretic peptide     Status: Abnormal   Collection Time: 11/12/14  3:55 AM  Result Value Ref Range   B Natriuretic Peptide 138.6 (H) 0.0 - 100.0 pg/mL  Comprehensive metabolic panel     Status: Abnormal   Collection Time: 11/12/14  7:19 AM  Result Value Ref Range   Sodium 136 135 - 145 mmol/L   Potassium 3.5 3.5 - 5.1 mmol/L   Chloride 97 (L) 101 - 111 mmol/L   CO2 25 22 - 32 mmol/L   Glucose, Bld 77 70 - 99 mg/dL   BUN 9 6 - 20 mg/dL   Creatinine, Ser 0.88 0.44 - 1.00 mg/dL   Calcium 8.8 (L) 8.9 - 10.3 mg/dL   Total Protein 7.2 6.5 - 8.1 g/dL   Albumin 2.7 (L) 3.5 - 5.0 g/dL   AST 14 (L) 15 - 41 U/L   ALT 10 (L)  14 - 54 U/L   Alkaline Phosphatase 82 38 - 126 U/L   Total Bilirubin 0.8 0.3 - 1.2 mg/dL   GFR calc non Af Amer >60 >60 mL/min   GFR calc Af Amer >60 >60 mL/min    Comment: (NOTE) The eGFR has been calculated using the CKD EPI equation. This calculation has not been validated in all clinical situations. eGFR's persistently <90 mL/min signify possible Chronic Kidney Disease.    Anion gap 14 5 - 15  CBC with Differential/Platelet     Status: Abnormal   Collection Time: 11/12/14  7:19 AM  Result Value Ref Range   WBC 18.3 (H) 4.0 - 10.5 K/uL   RBC 4.72 3.87 - 5.11 MIL/uL   Hemoglobin 12.4 12.0 - 15.0 g/dL   HCT 38.5 36.0 - 46.0 %   MCV 81.6 78.0 - 100.0 fL   MCH 26.3 26.0 - 34.0 pg   MCHC 32.2 30.0 - 36.0 g/dL   RDW 14.6 11.5 - 15.5 %   Platelets 409 (H) 150 - 400 K/uL   Neutrophils Relative % 83 (H) 43 - 77 %   Neutro Abs 15.3 (H) 1.7 - 7.7 K/uL   Lymphocytes Relative 8 (L) 12 - 46 %   Lymphs Abs 1.5 0.7 - 4.0 K/uL   Monocytes Relative 9 3 - 12 %   Monocytes Absolute 1.6 (H) 0.1 - 1.0 K/uL   Eosinophils Relative 0 0 - 5 %   Eosinophils Absolute 0.0 0.0 - 0.7 K/uL   Basophils Relative 0 0 - 1 %   Basophils Absolute 0.0 0.0 - 0.1 K/uL  Troponin I     Status: Abnormal    Collection Time: 11/12/14  7:19 AM  Result Value Ref Range   Troponin I 0.04 (H) <0.031 ng/mL    Comment:        PERSISTENTLY INCREASED TROPONIN VALUES IN THE RANGE OF 0.04-0.49 ng/mL CAN BE SEEN IN:       -UNSTABLE ANGINA       -CONGESTIVE HEART FAILURE       -MYOCARDITIS       -CHEST TRAUMA       -ARRYHTHMIAS       -LATE PRESENTING MYOCARDIAL INFARCTION       -COPD   CLINICAL FOLLOW-UP RECOMMENDED.   Lactate dehydrogenase (CSF, pleural or peritoneal fluid)     Status: Abnormal   Collection Time: 11/12/14 11:44 AM  Result Value Ref Range   LD, Fluid 436 (H) 3 - 23 U/L    Comment: (NOTE) Results should be evaluated in conjunction with serum values    Fluid Type-FLDH FLUID     Comment: RIGHT PLEURAL CORRECTED ON 05/04 AT 1323: PREVIOUSLY REPORTED AS Pleural, L   Protein, pleural or peritoneal fluid     Status: None   Collection Time: 11/12/14 11:44 AM  Result Value Ref Range   Total protein, fluid 4.9 g/dL    Comment: (NOTE) No normal range established for this test Results should be evaluated in conjunction with serum values    Fluid Type-FTP FLUID     Comment: RIGHT PLEURAL CORRECTED ON 05/04 AT 1324: PREVIOUSLY REPORTED AS Pleural, L   Body fluid cell count with differential     Status: Abnormal   Collection Time: 11/12/14 11:44 AM  Result Value Ref Range   Fluid Type-FCT FLUID     Comment: RIGHT PLEURAL CORRECTED ON 05/04 AT 1324: PREVIOUSLY REPORTED AS THORACENTESIS    Color, Fluid YELLOW (A) YELLOW  Appearance, Fluid HAZY (A) CLEAR   WBC, Fluid 1493 (H) 0 - 1000 cu mm   Neutrophil Count, Fluid 75 (H) 0 - 25 %   Lymphs, Fluid 22 %   Monocyte-Macrophage-Serous Fluid 3 (L) 50 - 90 %  Lactate dehydrogenase     Status: None   Collection Time: 11/12/14  3:00 PM  Result Value Ref Range   LDH 145 98 - 192 U/L  Protein, total     Status: None   Collection Time: 11/12/14  3:00 PM  Result Value Ref Range   Total Protein 7.3 6.5 - 8.1 g/dL  Cholesterol, total      Status: None   Collection Time: 11/12/14  3:00 PM  Result Value Ref Range   Cholesterol 120 0 - 200 mg/dL    Dg Chest 2 View  11/11/2014   CLINICAL DATA:  Left-sided chest pain and shortness of breath for 4 days  EXAM: CHEST  2 VIEW  COMPARISON:  June 21, 2013  FINDINGS: There is extensive consolidation throughout the left lower lobe. There is questionable infiltrate in the lingula as well. Right lung is clear. Heart is mildly enlarged with pulmonary vascularity within normal limits. No adenopathy.  IMPRESSION: Extensive left lower lobe airspace consolidation. There may also be consolidation in a portion of the lingula. Right lung is clear. No change in cardiac prominence.   Electronically Signed   By: Lowella Grip III M.D.   On: 11/11/2014 15:48   Ct Angio Chest Pe W/cm &/or Wo Cm  11/12/2014   CLINICAL DATA:  Chest pain and short of breath.  EXAM: CT ANGIOGRAPHY CHEST WITH CONTRAST  TECHNIQUE: Multidetector CT imaging of the chest was performed using the standard protocol during bolus administration of intravenous contrast. Multiplanar CT image reconstructions and MIPs were obtained to evaluate the vascular anatomy.  CONTRAST:  119m OMNIPAQUE IOHEXOL 350 MG/ML SOLN  COMPARISON:  Plain film 11/11/2014, CT abdomen 10/14/2013  FINDINGS: Mediastinum/Nodes: There is no filling defects within the proximal main pulmonary arteries or the interlobar pulmonary arteries. Evaluation of the lower lobe segmental pulmonary arteries is degraded by patient respiratory motion.  No acute findings of the aorta great vessels. No pericardial fluid. Left ventricle appears mildly enlarged. No mediastinal lymphadenopathy.  Lungs/Pleura: There is large left pleural effusion which occupies 70% of the left hemi thorax volume. There is atelectasis of the left upper lobe and left lower lobe associated with these large pleural effusion. There is no clear obstructing mass lesion obstructing the bronchus. The lung parenchyma  itself is difficult to evaluate. The right lung is clear without evidence of fusion. There is mild atelectasis the right lung base.  Upper abdomen: Limited view of the liver, kidneys, pancreas are unremarkable. Normal adrenal glands.  No aggressive osseous lesion  Musculoskeletal: No aggressive osseous lesion  Review of the MIP images confirms the above findings.  IMPRESSION: 1. No evidence of acute pulmonary embolism within the main pulmonary arteries or intralobar pulmonary arteries. The lower lobe pulmonary arteries are poorly evaluated due to patient respiratory motion. 2. Large left pleural effusion occupying 70% of the left hemi thorax volume. There is passive atelectasis of the left lower lobe and left upper lobe. No obstructing mass lesion is identified.   Electronically Signed   By: SSuzy BouchardM.D.   On: 11/12/2014 01:08   Dg Chest Port 1 View  11/12/2014   CLINICAL DATA:  Post thoracentesis.  EXAM: PORTABLE CHEST - 1 VIEW  COMPARISON:  11/11/2014  FINDINGS: Large left pleural effusion with left lower lobe atelectasis or infiltrate. No pneumothorax following left thoracentesis. No scratch head minimal right base opacity, likely atelectasis. Cardiomegaly. No acute bony abnormality.  IMPRESSION: Large left pleural effusion with left lower lobe atelectasis or infiltrate. No pneumothorax.   Electronically Signed   By: Rolm Baptise M.D.   On: 11/12/2014 12:27    Review of Systems  Constitutional: Positive for fever and malaise/fatigue.  Respiratory: Positive for cough, sputum production and shortness of breath. Negative for hemoptysis.        Left sided pleuritic CP  Cardiovascular: Negative for chest pain (no anginal pain).  Gastrointestinal: Negative for nausea, vomiting and abdominal pain.  All other systems reviewed and are negative.  Blood pressure 178/98, pulse 92, temperature 97.5 F (36.4 C), temperature source Oral, resp. rate 19, height _0  (1.727 m), weight 205 lb 1.6 oz (93.033  kg), last menstrual period 11/11/2014, SpO2 98 %. Physical Exam  Vitals reviewed. Constitutional: She is oriented to person, place, and time. She appears distressed (mildly).  obese  Cardiovascular: Normal rate, regular rhythm and normal heart sounds.   No murmur heard. Respiratory: She has no wheezes.  Increased WOB, absent BS lower 2/3 of left chest  GI: Soft. There is no tenderness.  Musculoskeletal: She exhibits no edema.  Neurological: She is alert and oriented to person, place, and time. No cranial nerve deficit.  Skin: Skin is warm and dry.    Assessment/Plan: 44 yo woman presents with left sided pleuritic CP and shortness of breath. She has a large loculated left pleural effusion that is an exudate. This is consistent with a pneumonia complicated by a para-pneumonic effusion.  The best option to completely clear the pleural space and re-expand the lung is a left VATS, drainage of effusion and decortication.   I discussed the general nature of the procedure, the need for general anesthesia, and the incisions to be used with the patient. We discussed the expected hospital stay, overall recovery and short and long term outcomes. I reviewed the indications, risks, benefits, and alternatives with Emily Key. She understands the risks include, but are not limited to death, stroke, MI, DVT/PE, bleeding, possible need for transfusion, infections, air leaks, cardiac arrhythmias, and other organ system dysfunction including respiratory, renal, or GI complications. She accepts the risks and agrees to proceed.  She is currently on appropriate antibiotic coverage.  Her BP control needs to improve.  As it stands currently surgery will likely be Friday 5/6  Melrose Nakayama 11/12/2014, 6:21 PM

## 2014-11-12 NOTE — Care Management Note (Signed)
Case Management Note  Patient Details  Name: Shondra Capps MRN: [id number] Date of Birth: 09-07-1970   UR completed. Patient admitted as self pay. Will follow and set up at [facility] , [facility] .  Subjective/Objective:                    Action/Plan:   Expected Discharge Date:  11/14/14               Expected Discharge Plan:     In-House Referral:     Discharge planning Services  CM Consult  Post Acute Care Choice:    Choice offered to:     DME Arranged:    DME Agency:     HH Arranged:    HH Agency:     Status of Service:  In process, will continue to follow  Medicare Important Message Given:    Date Medicare IM Given:    Medicare IM give by:    Date Additional Medicare IM Given:    Additional Medicare Important Message give by:     If discussed at [place] of Stay Meetings, dates discussed:    Additional Comments:  Marilu Favre, RN 11/12/2014, 8:11 AM

## 2014-11-12 NOTE — Procedures (Signed)
Thoracentesis Procedure Note  Pre-operative Diagnosis: Pleural effusion   Post-operative Diagnosis: same; likely loculated   Indications: pleuritic type chest pain.   Procedure Details  Consent: Informed consent was obtained. Risks of the procedure were discussed including: infection, bleeding, pain, pneumothorax.  Under sterile conditions the patient was positioned. Betadine solution and sterile drapes were utilized.  1% buffered lidocaine was used to anesthetize the pleural   Space; which was identified via real time [place]. Fluid was obtained without any difficulties and minimal blood loss.  A dressing was applied to the wound and wound care instructions were provided.   Findings 300  ml of cloudy yellow pleural fluid was obtained. A sample was sent to Pathology for cytogenetics, flow, and cell counts, as well as for infection analysis.  At completion of thora still had large left fluid collection via [place] on lateral chest. Thoracic surgery called.   Complications:  None; patient tolerated the procedure well.          Condition: stable  Plan A follow up chest x-ray was ordered. Bed Rest for 0 hours. Tylenol 650 mg. for pain.

## 2014-11-12 NOTE — Consult Note (Signed)
Name: Emily Key MRN: [id number] DOB: 01-07-1971    ADMISSION DATE:  11/11/2014 CONSULTATION DATE:  5/4  REFERRING MD :  Tana Coast   CHIEF COMPLAINT:  Pleural effusion   BRIEF PATIENT DESCRIPTION:  44 year old female w/ only medical hx of HTN. Smokes ~ 1/4 ppd x 8 yrs. Admitted 5/3 w/ working dx of PNA and pleuritis. CT chest obtained showed large left effusion for which PCCM was consulted.   SIGNIFICANT EVENTS    STUDIES:  CT chest 4/3: There is large left pleural effusion which occupies 70% of the left hemi thorax volume. There is atelectasis of the left upper lobe and left lower lobe associated with these large pleural effusion. There is no clear obstructing mass lesion obstructing the bronchus. The lung parenchyma itself is difficult to evaluate. The right lung is clear without evidence of fusion. There is mild atelectasis the right lung base.   HISTORY OF PRESENT ILLNESS:   44 y.o. female with history of hypertension who has not been taking medications due to financial reasons presents to the ER 5/2 because of left-sided chest pain. Patient states her symptoms started with nonproductive cough 5 days prior, and last 3 days has been having left-sided chest pain on deep inspiration. Has been having some diaphoresis, subjective fever and chills. In the ER chest x-ray showed features concerning for left-sided pneumonia and was  been admitted for further management. Denied any nausea vomiting abdominal pain or diarrhea. CT of chest was obtained and showed large left effusion for which we were consulted.   PAST MEDICAL HISTORY :   has a past medical history of Coronary artery disease; Hypertension; and CHF (congestive heart failure).  has past surgical history that includes Tonsillectomy and tubal ligation. Prior to Admission medications   Medication Sig Start Date End Date Taking? Authorizing Provider  naproxen sodium (ANAPROX) 220 MG tablet Take 440 mg by mouth daily as needed (for pain).    Yes Historical Provider, MD  hydrochlorothiazide (HYDRODIURIL) 12.5 MG tablet Take 1 tablet (12.5 mg total) by mouth once. 04/03/14   Sol Passer, MD  lisinopril (PRINIVIL,ZESTRIL) 20 MG tablet Take 1 tablet (20 mg total) by mouth once. 04/03/14   Sol Passer, MD  metoprolol (LOPRESSOR) 25 MG tablet Take 1 tablet (25 mg total) by mouth 2 (two) times daily. 04/03/14   Sol Passer, MD   No Known Allergies  FAMILY HISTORY:  family history includes Diabetes Mellitus II in her mother; Hypertension in her mother; Lung cancer in her father. SOCIAL HISTORY:  reports that she has been smoking Cigarettes.  She has been smoking about 0.25 packs per day. She does not have any smokeless tobacco history on file. She reports that she does not drink alcohol or use illicit drugs.  REVIEW OF SYSTEMS:   Constitutional: +fever, chills, weight loss, malaise/fatigue and + diaphoresis.  HENT: Negative for hearing loss, ear pain, nosebleeds, congestion, sore throat, neck pain, tinnitus and ear discharge.   Eyes: Negative for blurred vision, double vision, photophobia, pain, discharge and redness.  Respiratory: + non-productive cough, hemoptysis, sputum production, shortness of breath, wheezing and stridor.  Marked pleuritic type CP on left.  Cardiovascular palpitations, orthopnea, claudication, leg swelling and PND.  Gastrointestinal: Negative for heartburn, nausea, vomiting, abdominal pain, diarrhea, constipation, blood in stool and melena.  Genitourinary: Negative for dysuria, urgency, frequency, hematuria and flank pain.  Musculoskeletal: Negative for myalgias, back pain, joint pain and falls.  Skin: Negative for itching and rash.  Neurological: Negative for  dizziness, tingling, tremors, sensory change, speech change, focal weakness, seizures, loss of consciousness, weakness and headaches.  Endo/Heme/Allergies: Negative for environmental allergies and polydipsia. Does not bruise/bleed easily.  SUBJECTIVE:    C/o left CP  VITAL SIGNS: Temp:  [97.5 F (36.4 C)-98.2 F (36.8 C)] 98 F (36.7 C) (05/04 0600) Pulse Rate:  [82-101] 82 (05/04 0600) Resp:  [19-47] 19 (05/04 0241) BP: (177-217)/(88-114) 186/97 mmHg (05/04 0656) SpO2:  [91 %-100 %] 98 % (05/04 0600) Weight:  [90.266 kg (199 lb)-93.033 kg (205 lb 1.6 oz)] 93.033 kg (205 lb 1.6 oz) (05/03 2046)  PHYSICAL EXAMINATION: General:  Well developed 44 year old female, not in acute distress but does have marked left sided pleuritic discomfort w/ inspiration  Neuro:  Alert and oriented w/ out gross focal def  HEENT:  [place], no JVD  Cardiovascular:  rrr Lungs:  Decreased on left  Abdomen:  Soft, non-tender + bowel sounds  Musculoskeletal:  Intact  Skin:  Intact    Recent Labs Lab 11/11/14 1635 11/11/14 2242 11/12/14 0719  NA 135  --  136  K 3.3*  --  3.5  CL 97*  --  97*  CO2 25  --  25  BUN 9  --  9  CREATININE 0.88 0.94 0.88  GLUCOSE 119*  --  77    Recent Labs Lab 11/11/14 1635 11/11/14 2242 11/12/14 0719  HGB 13.5 12.6 12.4  HCT 40.4 38.0 38.5  WBC 18.2* 17.4* 18.3*  PLT 391 391 409*   Dg Chest 2 View  11/11/2014   CLINICAL DATA:  Left-sided chest pain and shortness of breath for 4 days  EXAM: CHEST  2 VIEW  COMPARISON:  June 21, 2013  FINDINGS: There is extensive consolidation throughout the left lower lobe. There is questionable infiltrate in the lingula as well. Right lung is clear. Heart is mildly enlarged with pulmonary vascularity within normal limits. No adenopathy.  IMPRESSION: Extensive left lower lobe airspace consolidation. There may also be consolidation in a portion of the lingula. Right lung is clear. No change in cardiac prominence.   Electronically Signed   By: Lowella Grip III M.D.   On: 11/11/2014 15:48   Ct Angio Chest Pe W/cm &/or Wo Cm  11/12/2014   CLINICAL DATA:  Chest pain and short of breath.  EXAM: CT ANGIOGRAPHY CHEST WITH CONTRAST  TECHNIQUE: Multidetector CT imaging of the chest was  performed using the standard protocol during bolus administration of intravenous contrast. Multiplanar CT image reconstructions and MIPs were obtained to evaluate the vascular anatomy.  CONTRAST:  13mL OMNIPAQUE IOHEXOL 350 MG/ML SOLN  COMPARISON:  Plain film 11/11/2014, CT abdomen 10/14/2013  FINDINGS: Mediastinum/Nodes: There is no filling defects within the proximal main pulmonary arteries or the interlobar pulmonary arteries. Evaluation of the lower lobe segmental pulmonary arteries is degraded by patient respiratory motion.  No acute findings of the aorta great vessels. No pericardial fluid. Left ventricle appears mildly enlarged. No mediastinal lymphadenopathy.  Lungs/Pleura: There is large left pleural effusion which occupies 70% of the left hemi thorax volume. There is atelectasis of the left upper lobe and left lower lobe associated with these large pleural effusion. There is no clear obstructing mass lesion obstructing the bronchus. The lung parenchyma itself is difficult to evaluate. The right lung is clear without evidence of fusion. There is mild atelectasis the right lung base.  Upper abdomen: Limited view of the liver, kidneys, pancreas are unremarkable. Normal adrenal glands.  No aggressive osseous lesion  Musculoskeletal: No aggressive osseous lesion  Review of the MIP images confirms the above findings.  IMPRESSION: 1. No evidence of acute pulmonary embolism within the main pulmonary arteries or intralobar pulmonary arteries. The lower lobe pulmonary arteries are poorly evaluated due to patient respiratory motion. 2. Large left pleural effusion occupying 70% of the left hemi thorax volume. There is passive atelectasis of the left lower lobe and left upper lobe. No obstructing mass lesion is identified.   Electronically Signed   By: Suzy Bouchard M.D.   On: 11/12/2014 01:08    ASSESSMENT / PLAN:  CAP w/ large left effusion and resultant Plueritis. Likely Parapneumonic, although empyema on  d/dx given what looks like possible early loculation.  Plan Cont current abx Will add PRN toradol for pleuritic type CP Will go ahead w/ therapeutic/diagnostic thoracentesis today. Depending on results may need CVTS involvement if unable to fully drain and looks exudative.   All other issues: HTN Plan Per IM service   Pulmonary and [place] Pager: [phone]  11/12/2014, 9:39 AM

## 2014-11-12 NOTE — Progress Notes (Signed)
[facility]                                                                              Patient Demographics  Emily Key, is a 44 y.o. female, DOB - [birth year], DPO:[id number]  Admit date - 11/11/2014   Admitting Physician Rise Patience, MD  Outpatient Primary MD for the patient is No PCP Per Patient  LOS - 1   Chief Complaint  Patient presents with  . Chest Pain       Brief HPI   Patient is a 44 year old female with hypertension, had not been on antihypertensives due to financial reasons presented to ED with left-sided pleuritic chest pain. Patient reported that her symptoms started with nonproductive cough 5 days prior to admission and in the last 3 days she was having left-sided chest pain on deep inspiration. She also reported some diaphoresis. Chest x-ray showed left-sided pneumonia and patient was admitted for further workup.   Assessment & Plan    Principal Problem:   CAP (community acquired pneumonia)/extensive left-sided consolidation with loculated pleural effusion - CT of the chest showed no pulmonary embolism, however large left pleural effusion occupying 70% of the left hemithorax with passive atelectasis but no obstructive mass lesion is identified - Pulmonary consulted, patient underwent thoracentesis, only 300 mL of cloudy yellow pleural fluid was obtained and studies sent  -CT surgery also consulted, for further recommendations.   Active Problems: Leukocytosis - Likely due to #1, follow blood cultures, continue IV antibiotics     Hypertensive urgency - Placed on beta blocker, HCTZ, hydralazine as needed    Pleuritic chest pain -Continue pain medication, rule out for acute ACS    Code Status:Full code  Family Communication: Discussed in detail with the patient, all imaging results, lab results explained to the patient  Disposition Plan: Not medically ready   Time Spent in minutes   25 minutes  Procedures  Thoracentesis CT  chest   Consults   Pulmonology CT surgery   DVT Prophylaxis   Lovenox   Medications  Scheduled Meds: . azithromycin  500 mg Oral QHS  . cefTRIAXone (ROCEPHIN)  IV  1 g Intravenous Q24H  . docusate sodium  100 mg Oral BID  . enoxaparin (LOVENOX) injection  40 mg Subcutaneous Q24H  . ketorolac  30 mg Intravenous 3 times per day  . lisinopril  5 mg Oral Daily  . polyethylene glycol  17 g Oral Daily   Continuous Infusions:  PRN Meds:.acetaminophen **OR** acetaminophen, hydrALAZINE, HYDROmorphone (DILAUDID) injection, ondansetron **OR** ondansetron (ZOFRAN) IV, traMADol   Antibiotics   Anti-infectives    Start     Dose/Rate Route Frequency Ordered Stop   11/12/14 2200  azithromycin (ZITHROMAX) tablet 500 mg     500 mg Oral Daily at bedtime 11/12/14 1045 11/18/14 2159   11/12/14 2000  cefTRIAXone (ROCEPHIN) 1 g in dextrose 5 % 50 mL IVPB - Premix     1 g 100 mL/hr over 30 Minutes Intravenous Every 24 hours 11/11/14 2142 11/18/14 1959   11/11/14 2200  azithromycin (ZITHROMAX) 500 mg in dextrose 5 % 250 mL IVPB  Status:  Discontinued  500 mg 250 mL/hr over 60 Minutes Intravenous Every 24 hours 11/11/14 2142 11/12/14 1045   11/11/14 1930  azithromycin (ZITHROMAX) 500 mg in dextrose 5 % 250 mL IVPB  Status:  Discontinued     500 mg 250 mL/hr over 60 Minutes Intravenous  Once 11/11/14 1925 11/11/14 2256   11/11/14 1930  cefTRIAXone (ROCEPHIN) 1 g in dextrose 5 % 50 mL IVPB     1 g 100 mL/hr over 30 Minutes Intravenous  Once 11/11/14 1925 11/11/14 2042        Subjective:   Emily Key was seen and examined today. Chest pain improving, denies any abdominal pain, N/V/D/C, new weakness, numbess, tingling. No acute events overnight.    Objective:   Blood pressure 186/97, pulse 82, temperature 98 F (36.7 C), temperature source Oral, resp. rate 19, height 5\' 8"  (1.727 m), weight 93.033 kg (205 lb 1.6 oz), last menstrual period 11/11/2014, SpO2 98 %.  Wt Readings from Last  3 Encounters:  11/11/14 93.033 kg (205 lb 1.6 oz)  04/03/14 89.812 kg (198 lb)  10/14/13 90.266 kg (199 lb)     Intake/Output Summary (Last 24 hours) at 11/12/14 1351 Last data filed at 11/11/14 2019  Gross per 24 hour  Intake   1000 ml  Output      0 ml  Net   1000 ml    Exam  General: Alert and oriented x 3, NAD  HEENT:  PERRLA, EOMI, Anicteic Sclera, mucous membranes moist.   Neck: Supple, no JVD, no masses  CVS: S1 S2 auscultated, no rubs, murmurs or gallops. Regular rate and rhythm.  Respiratory: Decreased breath sounds at the bases b/l, left side decreased breath sounds  men: Soft, nontender, nondistended, + bowel sounds  Ext: no cyanosis clubbing or edema  Neuro: AAOx3, Cr N's II- XII. Strength 5/5 upper and lower extremities bilaterally  Skin: No rashes  Psych: Normal affect and demeanor, alert and oriented x3    Data Review   Micro Results No results found for this or any previous visit (from the past 240 hour(s)).  Radiology Reports Dg Chest 2 View  11/11/2014   CLINICAL DATA:  Left-sided chest pain and shortness of breath for 4 days  EXAM: CHEST  2 VIEW  COMPARISON:  June 21, 2013  FINDINGS: There is extensive consolidation throughout the left lower lobe. There is questionable infiltrate in the lingula as well. Right lung is clear. Heart is mildly enlarged with pulmonary vascularity within normal limits. No adenopathy.  IMPRESSION: Extensive left lower lobe airspace consolidation. There may also be consolidation in a portion of the lingula. Right lung is clear. No change in cardiac prominence.   Electronically Signed   By: Lowella Grip III M.D.   On: 11/11/2014 15:48   Ct Angio Chest Pe W/cm &/or Wo Cm  11/12/2014   CLINICAL DATA:  Chest pain and short of breath.  EXAM: CT ANGIOGRAPHY CHEST WITH CONTRAST  TECHNIQUE: Multidetector CT imaging of the chest was performed using the standard protocol during bolus administration of intravenous contrast.  Multiplanar CT image reconstructions and MIPs were obtained to evaluate the vascular anatomy.  CONTRAST:  131mL OMNIPAQUE IOHEXOL 350 MG/ML SOLN  COMPARISON:  Plain film 11/11/2014, CT abdomen 10/14/2013  FINDINGS: Mediastinum/Nodes: There is no filling defects within the proximal main pulmonary arteries or the interlobar pulmonary arteries. Evaluation of the lower lobe segmental pulmonary arteries is degraded by patient respiratory motion.  No acute findings of the aorta great vessels. No pericardial  fluid. Left ventricle appears mildly enlarged. No mediastinal lymphadenopathy.  Lungs/Pleura: There is large left pleural effusion which occupies 70% of the left hemi thorax volume. There is atelectasis of the left upper lobe and left lower lobe associated with these large pleural effusion. There is no clear obstructing mass lesion obstructing the bronchus. The lung parenchyma itself is difficult to evaluate. The right lung is clear without evidence of fusion. There is mild atelectasis the right lung base.  Upper abdomen: Limited view of the liver, kidneys, pancreas are unremarkable. Normal adrenal glands.  No aggressive osseous lesion  Musculoskeletal: No aggressive osseous lesion  Review of the MIP images confirms the above findings.  IMPRESSION: 1. No evidence of acute pulmonary embolism within the main pulmonary arteries or intralobar pulmonary arteries. The lower lobe pulmonary arteries are poorly evaluated due to patient respiratory motion. 2. Large left pleural effusion occupying 70% of the left hemi thorax volume. There is passive atelectasis of the left lower lobe and left upper lobe. No obstructing mass lesion is identified.   Electronically Signed   By: Suzy Bouchard M.D.   On: 11/12/2014 01:08   Dg Chest Port 1 View  11/12/2014   CLINICAL DATA:  Post thoracentesis.  EXAM: PORTABLE CHEST - 1 VIEW  COMPARISON:  11/11/2014  FINDINGS: Large left pleural effusion with left lower lobe atelectasis or  infiltrate. No pneumothorax following left thoracentesis. No scratch head minimal right base opacity, likely atelectasis. Cardiomegaly. No acute bony abnormality.  IMPRESSION: Large left pleural effusion with left lower lobe atelectasis or infiltrate. No pneumothorax.   Electronically Signed   By: Rolm Baptise M.D.   On: 11/12/2014 12:27    CBC  Recent Labs Lab 11/11/14 1635 11/11/14 2242 11/12/14 0719  WBC 18.2* 17.4* 18.3*  HGB 13.5 12.6 12.4  HCT 40.4 38.0 38.5  PLT 391 391 409*  MCV 78.6 79.2 81.6  MCH 26.3 26.3 26.3  MCHC 33.4 33.2 32.2  RDW 14.1 14.2 14.6  LYMPHSABS  --   --  1.5  MONOABS  --   --  1.6*  EOSABS  --   --  0.0  BASOSABS  --   --  0.0    Chemistries   Recent Labs Lab 11/11/14 1635 11/11/14 2242 11/12/14 0719  NA 135  --  136  K 3.3*  --  3.5  CL 97*  --  97*  CO2 25  --  25  GLUCOSE 119*  --  77  BUN 9  --  9  CREATININE 0.88 0.94 0.88  CALCIUM 9.2  --  8.8*  AST  --   --  14*  ALT  --   --  10*  ALKPHOS  --   --  82  BILITOT  --   --  0.8   ------------------------------------------------------------------------------------------------------------------ estimated creatinine clearance is 96.2 mL/min (by C-G formula based on Cr of 0.88). ------------------------------------------------------------------------------------------------------------------ No results for input(s): HGBA1C in the last 72 hours. ------------------------------------------------------------------------------------------------------------------ No results for input(s): CHOL, HDL, LDLCALC, TRIG, CHOLHDL, LDLDIRECT in the last 72 hours. ------------------------------------------------------------------------------------------------------------------ No results for input(s): TSH, T4TOTAL, T3FREE, THYROIDAB in the last 72 hours.  Invalid input(s): FREET3 ------------------------------------------------------------------------------------------------------------------ No  results for input(s): VITAMINB12, FOLATE, FERRITIN, TIBC, IRON, RETICCTPCT in the last 72 hours.  Coagulation profile No results for input(s): INR, PROTIME in the last 168 hours.   Recent Labs  11/11/14 1635  DDIMER 2.61*    Cardiac Enzymes  Recent Labs Lab 11/12/14 0719  TROPONINI 0.04*   ------------------------------------------------------------------------------------------------------------------  Invalid input(s): POCBNP  No results for input(s): GLUCAP in the last 72 hours.   Jaqua Ching M.D. [facility] 11/12/2014, 1:51 PM  Pager: [phone]   Between 7am to 7pm - call Pager - [phone]  After 7pm go to [email or web address] - password [email or web address]  Call night coverage person covering after 7pm

## 2014-11-13 DIAGNOSIS — R079 Chest pain, unspecified: Secondary | ICD-10-CM | POA: Insufficient documentation

## 2014-11-13 DIAGNOSIS — R0789 Other chest pain: Secondary | ICD-10-CM | POA: Insufficient documentation

## 2014-11-13 DIAGNOSIS — Z9889 Other specified postprocedural states: Secondary | ICD-10-CM | POA: Insufficient documentation

## 2014-11-13 DIAGNOSIS — R071 Chest pain on breathing: Secondary | ICD-10-CM

## 2014-11-13 LAB — BASIC METABOLIC PANEL
ANION GAP: 10 (ref 5–15)
BUN: 11 mg/dL (ref 6–20)
CHLORIDE: 99 mmol/L — AB (ref 101–111)
CO2: 27 mmol/L (ref 22–32)
Calcium: 8.9 mg/dL (ref 8.9–10.3)
Creatinine, Ser: 0.76 mg/dL (ref 0.44–1.00)
GFR calc Af Amer: >60 mL/min (ref >60)
GLUCOSE: 99 mg/dL (ref 70–99)
Potassium: 3.5 mmol/L (ref 3.5–5.1)
Sodium: 136 mmol/L (ref 135–145)

## 2014-11-13 LAB — COMPREHENSIVE METABOLIC PANEL
ALBUMIN: 2.6 g/dL — AB (ref 3.5–5.0)
ALK PHOS: 86 U/L (ref 38–126)
ALT: 9 U/L — ABNORMAL LOW (ref 14–54)
AST: 14 U/L — ABNORMAL LOW (ref 15–41)
Anion gap: 8 (ref 5–15)
BUN: 14 mg/dL (ref 6–20)
CALCIUM: 8.9 mg/dL (ref 8.9–10.3)
CO2: 31 mmol/L (ref 22–32)
Chloride: 96 mmol/L — ABNORMAL LOW (ref 101–111)
Creatinine, Ser: 0.85 mg/dL (ref 0.44–1.00)
GFR calc non Af Amer: >60 mL/min (ref >60)
Glucose, Bld: 126 mg/dL — ABNORMAL HIGH (ref 70–99)
POTASSIUM: 2.9 mmol/L — AB (ref 3.5–5.1)
Sodium: 135 mmol/L (ref 135–145)
TOTAL PROTEIN: 7.3 g/dL (ref 6.5–8.1)
Total Bilirubin: 0.7 mg/dL (ref 0.3–1.2)

## 2014-11-13 LAB — PROTIME-INR
INR: 1.21 (ref 0.00–1.49)
Prothrombin Time: 15.4 seconds — ABNORMAL HIGH (ref 11.6–15.2)

## 2014-11-13 LAB — URINALYSIS, ROUTINE W REFLEX MICROSCOPIC
GLUCOSE, UA: NEGATIVE mg/dL
KETONES UR: 15 mg/dL — AB
NITRITE: POSITIVE — AB
PH: 5.5 (ref 5.0–8.0)
Protein, ur: 100 mg/dL — AB
SPECIFIC GRAVITY, URINE: 1.029 (ref 1.005–1.030)
Urobilinogen, UA: 4 mg/dL — ABNORMAL HIGH (ref 0.0–1.0)

## 2014-11-13 LAB — TYPE AND SCREEN
ABO/RH(D): O POS
ANTIBODY SCREEN: NEGATIVE

## 2014-11-13 LAB — BLOOD GAS, ARTERIAL
Acid-Base Excess: 5.5 mmol/L — ABNORMAL HIGH (ref 0.0–2.0)
Bicarbonate: 28.8 mEq/L — ABNORMAL HIGH (ref 20.0–24.0)
DRAWN BY: 319961
FIO2: 0.21 %
O2 Saturation: 95.7 %
PATIENT TEMPERATURE: 98.6
PH ART: 7.502 — AB (ref 7.350–7.450)
TCO2: 29.9 mmol/L (ref 0–100)
pCO2 arterial: 37.1 mmHg (ref 35.0–45.0)
pO2, Arterial: 80.4 mmHg (ref 80.0–100.0)

## 2014-11-13 LAB — URINE MICROSCOPIC-ADD ON

## 2014-11-13 LAB — CBC
HCT: 38 % (ref 36.0–46.0)
HEMATOCRIT: 37.1 % (ref 36.0–46.0)
HEMOGLOBIN: 12.1 g/dL (ref 12.0–15.0)
HEMOGLOBIN: 12.3 g/dL (ref 12.0–15.0)
MCH: 26.2 pg (ref 26.0–34.0)
MCH: 25.8 pg — ABNORMAL LOW (ref 26.0–34.0)
MCHC: 32.6 g/dL (ref 30.0–36.0)
MCHC: 32.4 g/dL (ref 30.0–36.0)
MCV: 80.5 fL (ref 78.0–100.0)
MCV: 79.8 fL (ref 78.0–100.0)
Platelets: 377 10*3/uL (ref 150–400)
Platelets: 391 10*3/uL (ref 150–400)
RBC: 4.61 MIL/uL (ref 3.87–5.11)
RBC: 4.76 MIL/uL (ref 3.87–5.11)
RDW: 14.6 % (ref 11.5–15.5)
RDW: 14.3 % (ref 11.5–15.5)
WBC: 18.3 10*3/uL — AB (ref 4.0–10.5)
WBC: 17.9 10*3/uL — ABNORMAL HIGH (ref 4.0–10.5)

## 2014-11-13 LAB — APTT: aPTT: 42 seconds — ABNORMAL HIGH (ref 24–37)

## 2014-11-13 LAB — ABO/RH: ABO/RH(D): O POS

## 2014-11-13 MED ORDER — PIPERACILLIN-TAZOBACTAM 3.375 G IVPB
3.3750 g | Freq: Three times a day (TID) | INTRAVENOUS | Status: AC
  Administered 2014-11-13 – 2014-11-19 (×18): 3.375 g via INTRAVENOUS
  Filled 2014-11-13 (×21): qty 50

## 2014-11-13 MED ORDER — LISINOPRIL 20 MG PO TABS
20.0000 mg | ORAL_TABLET | Freq: Every day | ORAL | Status: DC
  Administered 2014-11-13: 20 mg via ORAL
  Filled 2014-11-13: qty 1

## 2014-11-13 MED ORDER — VANCOMYCIN HCL IN DEXTROSE 1-5 GM/200ML-% IV SOLN
1000.0000 mg | INTRAVENOUS | Status: DC
  Filled 2014-11-13: qty 200

## 2014-11-13 MED ORDER — VANCOMYCIN HCL IN DEXTROSE 1-5 GM/200ML-% IV SOLN
1000.0000 mg | Freq: Three times a day (TID) | INTRAVENOUS | Status: DC
  Administered 2014-11-13 – 2014-11-19 (×17): 1000 mg via INTRAVENOUS
  Filled 2014-11-13 (×22): qty 200

## 2014-11-13 NOTE — Progress Notes (Signed)
ANTIBIOTIC CONSULT NOTE - INITIAL  Pharmacy Consult for Vancomycin and Zosyn Indication: pneumonia with empyema  No Known Allergies  Patient Measurements: Height: 5\' 8"  (172.7 cm) Weight: 205 lb 1.6 oz (93.033 kg) IBW/kg (Calculated) : 63.9  Vital Signs: Temp: 98.3 F (36.8 C) (05/05 0537) BP: 177/85 mmHg (05/05 0537) Pulse Rate: 83 (05/05 0537) Intake/Output from previous day: 05/04 0701 - 05/05 0700 In: 840 [P.O.:840] Out: -  Intake/Output from this shift:    Labs:  Recent Labs  11/11/14 2242 11/12/14 0719 11/13/14 0343  WBC 17.4* 18.3* 18.3*  HGB 12.6 12.4 12.1  PLT 391 409* 377  CREATININE 0.94 0.88 0.76   Estimated Creatinine Clearance: 105.8 mL/min (by C-G formula based on Cr of 0.76). No results for input(s): VANCOTROUGH, VANCOPEAK, VANCORANDOM, GENTTROUGH, GENTPEAK, GENTRANDOM, TOBRATROUGH, TOBRAPEAK, TOBRARND, AMIKACINPEAK, AMIKACINTROU, AMIKACIN in the last 72 hours.   Microbiology: Recent Results (from the past 720 hour(s))  Blood culture (routine x 2)     Status: None (Preliminary result)   Collection Time: 11/11/14  7:58 PM  Result Value Ref Range Status   Specimen Description BLOOD RIGHT WRIST  Final   Special Requests BOTTLES DRAWN AEROBIC AND ANAEROBIC 5CC   Final   Culture   Final           BLOOD CULTURE RECEIVED NO GROWTH TO DATE CULTURE WILL BE HELD FOR 5 DAYS BEFORE ISSUING A FINAL NEGATIVE REPORT Performed at [facility]    Report Status PENDING  Incomplete  Blood culture (routine x 2)     Status: None (Preliminary result)   Collection Time: 11/11/14  8:01 PM  Result Value Ref Range Status   Specimen Description BLOOD LEFT WRIST  Final   Special Requests BOTTLES DRAWN AEROBIC AND ANAEROBIC 4CC  Final   Culture   Final           BLOOD CULTURE RECEIVED NO GROWTH TO DATE CULTURE WILL BE HELD FOR 5 DAYS BEFORE ISSUING A FINAL NEGATIVE REPORT Performed at [facility]    Report Status PENDING  Incomplete  Body fluid culture      Status: None (Preliminary result)   Collection Time: 11/12/14 11:44 AM  Result Value Ref Range Status   Specimen Description FLUID LEFT PLEURAL  Final   Special Requests Normal  Final   Gram Stain   Final    RARE WBC PRESENT, PREDOMINANTLY PMN NO ORGANISMS SEEN Performed at [facility]    Culture NO GROWTH Performed at [facility]   Final   Report Status PENDING  Incomplete    Medical History: Past Medical History  Diagnosis Date  . Coronary artery disease   . Hypertension   . CHF (congestive heart failure)     after last pregnancy    Assessment: 44 year old female to start on Vancomycin and Zosyn for pneumonia with empyema  Goal of Therapy:  Vancomycin trough level 15-20 mcg/ml  Appropriate Zosyn dosing  Plan:  Zosyn 3.375 grams iv Q 8 hours - 4 hr infusion Vancomycin 1 gram iv Q 8 hours Follow up Scr, cultures, progress  Thank you. Anette Guarneri, PharmD [phone]  11/13/2014,3:11 PM

## 2014-11-13 NOTE — Progress Notes (Signed)
Procedure(s) (LRB): VIDEO ASSISTED THORACOSCOPY (VATS)/DECORTICATION (Left) DRAINAGE OF PLEURAL EFFUSION (Left) Subjective: Cp and SOB about the same. No new complaints  Objective: Vital signs in last 24 hours: Temp:  [97.5 F (36.4 C)-98.3 F (36.8 C)] 98.3 F (36.8 C) (05/05 0537) Pulse Rate:  [83-92] 83 (05/05 0537) Cardiac Rhythm:  [-]  Resp:  [18] 18 (05/05 0537) BP: (177-181)/(82-98) 177/85 mmHg (05/05 0537) SpO2:  [97 %-100 %] 100 % (05/05 0537)  Hemodynamic parameters for last 24 hours:    Intake/Output from previous day: 05/04 0701 - 05/05 0700 In: 840 [P.O.:840] Out: -  Intake/Output this shift:    General appearance: alert, cooperative and no distress Lungs: diminished breath sounds left side  Lab Results:  Recent Labs  11/12/14 0719 11/13/14 0343  WBC 18.3* 18.3*  HGB 12.4 12.1  HCT 38.5 37.1  PLT 409* 377   BMET:  Recent Labs  11/12/14 0719 11/13/14 0343  NA 136 136  K 3.5 3.5  CL 97* 99*  CO2 25 27  GLUCOSE 77 99  BUN 9 11  CREATININE 0.88 0.76  CALCIUM 8.8* 8.9    PT/INR: No results for input(s): LABPROT, INR in the last 72 hours. ABG    Component Value Date/Time   TCO2 29 01/05/2010 2001   CBG (last 3)  No results for input(s): GLUCAP in the last 72 hours.  Assessment/Plan: S/P Procedure(s) (LRB): VIDEO ASSISTED THORACOSCOPY (VATS)/DECORTICATION (Left) DRAINAGE OF PLEURAL EFFUSION (Left) -  Large left pleural effusion- exudate, c/w para-pneumonic effusion For Left VATS, drainage of effusion and decortication tomorrow She ia aware of risks and benefits.  All questions answered   LOS: 2 days    Melrose Nakayama 11/13/2014

## 2014-11-13 NOTE — Progress Notes (Signed)
Name: Emily Key MRN: [id number] DOB: 1970/07/14    ADMISSION DATE:  11/11/2014 CONSULTATION DATE:  5/4  REFERRING MD :  Tana Coast   CHIEF COMPLAINT:  Pleural effusion   BRIEF PATIENT DESCRIPTION:  44 year old female w/ only medical hx of HTN. Smokes ~ 1/4 ppd x 8 yrs. Admitted 5/3 w/ working dx of PNA and pleuritis. CT chest obtained showed large left effusion for which PCCM was consulted.   SIGNIFICANT EVENTS    STUDIES:  CT chest 4/3: There is large left pleural effusion which occupies 70% of the left hemi thorax volume. There is atelectasis of the left upper lobe and left lower lobe associated with these large pleural effusion. There is no clear obstructing mass lesion obstructing the bronchus. The lung parenchyma itself is difficult to evaluate. The right lung is clear without evidence of fusion. There is mild atelectasis the right lung base. 5/4: left thora; only got 300 ml cloudy fluid. Analysis c/w Exudate.   SUBJECTIVE:  C/o left CP  VITAL SIGNS: Temp:  [97.5 F (36.4 C)-98.3 F (36.8 C)] 98.3 F (36.8 C) (05/05 0537) Pulse Rate:  [83-92] 83 (05/05 0537) Resp:  [18] 18 (05/05 0537) BP: (177-181)/(82-98) 177/85 mmHg (05/05 0537) SpO2:  [97 %-100 %] 100 % (05/05 0537)  PHYSICAL EXAMINATION: General:  Well developed 44 year old female, CP improved Neuro:  Alert and oriented w/ out gross focal def  HEENT:  [place], no JVD  Cardiovascular:  rrr Lungs:  Decreased on left  Abdomen:  Soft, non-tender + bowel sounds  Musculoskeletal:  Intact  Skin:  Intact    Recent Labs Lab 11/11/14 1635 11/11/14 2242 11/12/14 0719 11/13/14 0343  NA 135  --  136 136  K 3.3*  --  3.5 3.5  CL 97*  --  97* 99*  CO2 25  --  25 27  BUN 9  --  9 11  CREATININE 0.88 0.94 0.88 0.76  GLUCOSE 119*  --  77 99    Recent Labs Lab 11/11/14 2242 11/12/14 0719 11/13/14 0343  HGB 12.6 12.4 12.1  HCT 38.0 38.5 37.1  WBC 17.4* 18.3* 18.3*  PLT 391 409* 377   Dg Chest 2 View  11/11/2014    CLINICAL DATA:  Left-sided chest pain and shortness of breath for 4 days  EXAM: CHEST  2 VIEW  COMPARISON:  June 21, 2013  FINDINGS: There is extensive consolidation throughout the left lower lobe. There is questionable infiltrate in the lingula as well. Right lung is clear. Heart is mildly enlarged with pulmonary vascularity within normal limits. No adenopathy.  IMPRESSION: Extensive left lower lobe airspace consolidation. There may also be consolidation in a portion of the lingula. Right lung is clear. No change in cardiac prominence.   Electronically Signed   By: Lowella Grip III M.D.   On: 11/11/2014 15:48   Ct Angio Chest Pe W/cm &/or Wo Cm  11/12/2014   CLINICAL DATA:  Chest pain and short of breath.  EXAM: CT ANGIOGRAPHY CHEST WITH CONTRAST  TECHNIQUE: Multidetector CT imaging of the chest was performed using the standard protocol during bolus administration of intravenous contrast. Multiplanar CT image reconstructions and MIPs were obtained to evaluate the vascular anatomy.  CONTRAST:  164mL OMNIPAQUE IOHEXOL 350 MG/ML SOLN  COMPARISON:  Plain film 11/11/2014, CT abdomen 10/14/2013  FINDINGS: Mediastinum/Nodes: There is no filling defects within the proximal main pulmonary arteries or the interlobar pulmonary arteries. Evaluation of the lower lobe segmental pulmonary arteries  is degraded by patient respiratory motion.  No acute findings of the aorta great vessels. No pericardial fluid. Left ventricle appears mildly enlarged. No mediastinal lymphadenopathy.  Lungs/Pleura: There is large left pleural effusion which occupies 70% of the left hemi thorax volume. There is atelectasis of the left upper lobe and left lower lobe associated with these large pleural effusion. There is no clear obstructing mass lesion obstructing the bronchus. The lung parenchyma itself is difficult to evaluate. The right lung is clear without evidence of fusion. There is mild atelectasis the right lung base.  Upper abdomen:  Limited view of the liver, kidneys, pancreas are unremarkable. Normal adrenal glands.  No aggressive osseous lesion  Musculoskeletal: No aggressive osseous lesion  Review of the MIP images confirms the above findings.  IMPRESSION: 1. No evidence of acute pulmonary embolism within the main pulmonary arteries or intralobar pulmonary arteries. The lower lobe pulmonary arteries are poorly evaluated due to patient respiratory motion. 2. Large left pleural effusion occupying 70% of the left hemi thorax volume. There is passive atelectasis of the left lower lobe and left upper lobe. No obstructing mass lesion is identified.   Electronically Signed   By: Suzy Bouchard M.D.   On: 11/12/2014 01:08   Dg Chest Port 1 View  11/12/2014   CLINICAL DATA:  Post thoracentesis.  EXAM: PORTABLE CHEST - 1 VIEW  COMPARISON:  11/11/2014  FINDINGS: Large left pleural effusion with left lower lobe atelectasis or infiltrate. No pneumothorax following left thoracentesis. No scratch head minimal right base opacity, likely atelectasis. Cardiomegaly. No acute bony abnormality.  IMPRESSION: Large left pleural effusion with left lower lobe atelectasis or infiltrate. No pneumothorax.   Electronically Signed   By: Rolm Baptise M.D.   On: 11/12/2014 12:27    ASSESSMENT / PLAN:  CAP w/ large left Exudative and Loculated pleural effusion and resultant Plueritis. Not able to fully drain w/ bedside thora on 5/4. Thoracic surgery consulted.  Plan Cont current abx Cont toradol for pleuritic type CP Appreciate Thoracic surgery consult-->agree think she would benefit from VATS  All other issues: HTN Plan Per IM service  We will s/o Call if needed  Erick Colace ACNP-BC [place] Pager # [phone] OR # [phone] if no answer  11/13/2014, 9:22 AM

## 2014-11-13 NOTE — Progress Notes (Signed)
[facility]                                                                              Patient Demographics  Emily Key, is a 44 y.o. female, DOB - [birth year], KKX:[id number]  Admit date - 11/11/2014   Admitting Physician Rise Patience, MD  Outpatient Primary MD for the patient is No PCP Per Patient  LOS - 2   Chief Complaint  Patient presents with  . Chest Pain       Brief HPI   Patient is a 44 year old female with hypertension, had not been on antihypertensives due to financial reasons presented to ED with left-sided pleuritic chest pain. Patient reported that her symptoms started with nonproductive cough 5 days prior to admission and in the last 3 days she was having left-sided chest pain on deep inspiration. She also reported some diaphoresis. Chest x-ray showed left-sided pneumonia and patient was admitted for further workup.   Assessment & Plan    Principal Problem:   CAP (community acquired pneumonia)/extensive left-sided consolidation with loculated pleural effusion - CT of the chest showed no pulmonary embolism, however large left pleural effusion occupying 70% of the left hemithorax with passive atelectasis but no obstructive mass lesion is identified. Pulmonary consulted, patient underwent thoracentesis, only 300 mL of cloudy yellow pleural fluid was obtained and studies sent, consistent with empyema, WBCs 1493, 75% neutrophils.   - CT surgery consulted, VATS 5/6 - Change antibiotics to vancomycin and Zosyn  Active Problems: Leukocytosis - Afebrile, change antibiotics to vancomycin and Zosyn   Uncontrolled hypertension: Patient has not been taking any medications due to financial reasons -Increased metoprolol to 50 mg twice a day, lisinopril to 20 mg daily, HCTZ 12.5 daily    Pleuritic chest pain -Continue pain medication, per patient chest pain is improving today, troponin 0.04, borderline, not consistent with cardiac chest pain  Code  Status:Full code  Family Communication: Discussed in detail with the patient, all imaging results, lab results explained to the patient and sister at the bedside  Disposition Plan: Not medically ready   Time Spent in minutes   25 minutes  Procedures  Thoracentesis CT chest   Consults   Pulmonology CT surgery   DVT Prophylaxis   Lovenox   Medications  Scheduled Meds: . azithromycin  500 mg Oral QHS  . cefTRIAXone (ROCEPHIN)  IV  1 g Intravenous Q24H  . docusate sodium  100 mg Oral BID  . enoxaparin (LOVENOX) injection  40 mg Subcutaneous Q24H  . hydrochlorothiazide  12.5 mg Oral Daily  . ketorolac  30 mg Intravenous 3 times per day  . lisinopril  20 mg Oral Daily  . metoprolol tartrate  50 mg Oral BID  . polyethylene glycol  17 g Oral Daily  . [START ON 11/14/2014] vancomycin  1,000 mg Intravenous To 283   Continuous Infusions:  PRN Meds:.acetaminophen **OR** acetaminophen, hydrALAZINE, HYDROmorphone (DILAUDID) injection, ondansetron **OR** ondansetron (ZOFRAN) IV, traMADol   Antibiotics   Anti-infectives    Start     Dose/Rate Route Frequency Ordered Stop   11/14/14 1100  vancomycin (VANCOCIN) IVPB 1000 mg/200 mL premix  1,000 mg 200 mL/hr over 60 Minutes Intravenous To [facility] 11/13/14 1235 11/15/14 1100   11/12/14 2200  azithromycin (ZITHROMAX) tablet 500 mg     500 mg Oral Daily at bedtime 11/12/14 1045 11/18/14 2159   11/12/14 2000  cefTRIAXone (ROCEPHIN) 1 g in dextrose 5 % 50 mL IVPB - Premix     1 g 100 mL/hr over 30 Minutes Intravenous Every 24 hours 11/11/14 2142 11/18/14 1959   11/11/14 2200  azithromycin (ZITHROMAX) 500 mg in dextrose 5 % 250 mL IVPB  Status:  Discontinued     500 mg 250 mL/hr over 60 Minutes Intravenous Every 24 hours 11/11/14 2142 11/12/14 1045   11/11/14 1930  azithromycin (ZITHROMAX) 500 mg in dextrose 5 % 250 mL IVPB  Status:  Discontinued     500 mg 250 mL/hr over 60 Minutes Intravenous  Once 11/11/14 1925 11/11/14  2256   11/11/14 1930  cefTRIAXone (ROCEPHIN) 1 g in dextrose 5 % 50 mL IVPB     1 g 100 mL/hr over 30 Minutes Intravenous  Once 11/11/14 1925 11/11/14 2042        Subjective:   Emily Key was seen and examined today. Chest pain improving. Afebrile, denies any abdominal pain, N/V/D/C, new weakness, numbess, tingling. No acute events overnight.    Objective:   Blood pressure 177/85, pulse 83, temperature 98.3 F (36.8 C), temperature source Oral, resp. rate 18, height 5\' 8"  (1.727 m), weight 93.033 kg (205 lb 1.6 oz), last menstrual period 11/11/2014, SpO2 100 %.  Wt Readings from Last 3 Encounters:  11/11/14 93.033 kg (205 lb 1.6 oz)  04/03/14 89.812 kg (198 lb)  10/14/13 90.266 kg (199 lb)     Intake/Output Summary (Last 24 hours) at 11/13/14 1435 Last data filed at 11/13/14 0600  Gross per 24 hour  Intake    240 ml  Output      0 ml  Net    240 ml    Exam  General: Alert and oriented x 3, NAD  HEENT:  PERRLA, EOMI, Anicteic Sclera,  Neck: Supple, no JVD, no masses  CVS: S1 S2clear, RRR  Respiratory: Decreased breath sounds at the bases b/l, left side decreased breath sounds  men: Soft, NT, ND, NBS  Ext: no cyanosis clubbing or edema  Neuro: AAOx3, Cr N's II- XII. Strength 5/5 upper and lower extremities bilaterally  Skin: No rashes  Psych: Normal affect and demeanor, alert and oriented x3    Data Review   Micro Results Recent Results (from the past 240 hour(s))  Blood culture (routine x 2)     Status: None (Preliminary result)   Collection Time: 11/11/14  7:58 PM  Result Value Ref Range Status   Specimen Description BLOOD RIGHT WRIST  Final   Special Requests BOTTLES DRAWN AEROBIC AND ANAEROBIC 5CC   Final   Culture   Final           BLOOD CULTURE RECEIVED NO GROWTH TO DATE CULTURE WILL BE HELD FOR 5 DAYS BEFORE ISSUING A FINAL NEGATIVE REPORT Performed at [facility]    Report Status PENDING  Incomplete  Blood culture (routine x 2)      Status: None (Preliminary result)   Collection Time: 11/11/14  8:01 PM  Result Value Ref Range Status   Specimen Description BLOOD LEFT WRIST  Final   Special Requests BOTTLES DRAWN AEROBIC AND ANAEROBIC 4CC  Final   Culture   Final  BLOOD CULTURE RECEIVED NO GROWTH TO DATE CULTURE WILL BE HELD FOR 5 DAYS BEFORE ISSUING A FINAL NEGATIVE REPORT Performed at [facility]    Report Status PENDING  Incomplete  Body fluid culture     Status: None (Preliminary result)   Collection Time: 11/12/14 11:44 AM  Result Value Ref Range Status   Specimen Description FLUID LEFT PLEURAL  Final   Special Requests Normal  Final   Gram Stain   Final    RARE WBC PRESENT, PREDOMINANTLY PMN NO ORGANISMS SEEN Performed at [facility]    Culture NO GROWTH Performed at [facility]   Final   Report Status PENDING  Incomplete    Radiology Reports Dg Chest 2 View  11/11/2014   CLINICAL DATA:  Left-sided chest pain and shortness of breath for 4 days  EXAM: CHEST  2 VIEW  COMPARISON:  June 21, 2013  FINDINGS: There is extensive consolidation throughout the left lower lobe. There is questionable infiltrate in the lingula as well. Right lung is clear. Heart is mildly enlarged with pulmonary vascularity within normal limits. No adenopathy.  IMPRESSION: Extensive left lower lobe airspace consolidation. There may also be consolidation in a portion of the lingula. Right lung is clear. No change in cardiac prominence.   Electronically Signed   By: Lowella Grip III M.D.   On: 11/11/2014 15:48   Ct Angio Chest Pe W/cm &/or Wo Cm  11/12/2014   CLINICAL DATA:  Chest pain and short of breath.  EXAM: CT ANGIOGRAPHY CHEST WITH CONTRAST  TECHNIQUE: Multidetector CT imaging of the chest was performed using the standard protocol during bolus administration of intravenous contrast. Multiplanar CT image reconstructions and MIPs were obtained to evaluate the vascular anatomy.  CONTRAST:   149mL OMNIPAQUE IOHEXOL 350 MG/ML SOLN  COMPARISON:  Plain film 11/11/2014, CT abdomen 10/14/2013  FINDINGS: Mediastinum/Nodes: There is no filling defects within the proximal main pulmonary arteries or the interlobar pulmonary arteries. Evaluation of the lower lobe segmental pulmonary arteries is degraded by patient respiratory motion.  No acute findings of the aorta great vessels. No pericardial fluid. Left ventricle appears mildly enlarged. No mediastinal lymphadenopathy.  Lungs/Pleura: There is large left pleural effusion which occupies 70% of the left hemi thorax volume. There is atelectasis of the left upper lobe and left lower lobe associated with these large pleural effusion. There is no clear obstructing mass lesion obstructing the bronchus. The lung parenchyma itself is difficult to evaluate. The right lung is clear without evidence of fusion. There is mild atelectasis the right lung base.  Upper abdomen: Limited view of the liver, kidneys, pancreas are unremarkable. Normal adrenal glands.  No aggressive osseous lesion  Musculoskeletal: No aggressive osseous lesion  Review of the MIP images confirms the above findings.  IMPRESSION: 1. No evidence of acute pulmonary embolism within the main pulmonary arteries or intralobar pulmonary arteries. The lower lobe pulmonary arteries are poorly evaluated due to patient respiratory motion. 2. Large left pleural effusion occupying 70% of the left hemi thorax volume. There is passive atelectasis of the left lower lobe and left upper lobe. No obstructing mass lesion is identified.   Electronically Signed   By: Suzy Bouchard M.D.   On: 11/12/2014 01:08   Dg Chest Port 1 View  11/12/2014   CLINICAL DATA:  Post thoracentesis.  EXAM: PORTABLE CHEST - 1 VIEW  COMPARISON:  11/11/2014  FINDINGS: Large left pleural effusion with left lower lobe atelectasis or infiltrate. No pneumothorax following  left thoracentesis. No scratch head minimal right base opacity, likely  atelectasis. Cardiomegaly. No acute bony abnormality.  IMPRESSION: Large left pleural effusion with left lower lobe atelectasis or infiltrate. No pneumothorax.   Electronically Signed   By: Rolm Baptise M.D.   On: 11/12/2014 12:27    CBC  Recent Labs Lab 11/11/14 1635 11/11/14 2242 11/12/14 0719 11/13/14 0343  WBC 18.2* 17.4* 18.3* 18.3*  HGB 13.5 12.6 12.4 12.1  HCT 40.4 38.0 38.5 37.1  PLT 391 391 409* 377  MCV 78.6 79.2 81.6 80.5  MCH 26.3 26.3 26.3 26.2  MCHC 33.4 33.2 32.2 32.6  RDW 14.1 14.2 14.6 14.6  LYMPHSABS  --   --  1.5  --   MONOABS  --   --  1.6*  --   EOSABS  --   --  0.0  --   BASOSABS  --   --  0.0  --     Chemistries   Recent Labs Lab 11/11/14 1635 11/11/14 2242 11/12/14 0719 11/13/14 0343  NA 135  --  136 136  K 3.3*  --  3.5 3.5  CL 97*  --  97* 99*  CO2 25  --  25 27  GLUCOSE 119*  --  77 99  BUN 9  --  9 11  CREATININE 0.88 0.94 0.88 0.76  CALCIUM 9.2  --  8.8* 8.9  AST  --   --  14*  --   ALT  --   --  10*  --   ALKPHOS  --   --  82  --   BILITOT  --   --  0.8  --    ------------------------------------------------------------------------------------------------------------------ estimated creatinine clearance is 105.8 mL/min (by C-G formula based on Cr of 0.76). ------------------------------------------------------------------------------------------------------------------ No results for input(s): HGBA1C in the last 72 hours. ------------------------------------------------------------------------------------------------------------------  Recent Labs  11/12/14 1500  CHOL 120   ------------------------------------------------------------------------------------------------------------------ No results for input(s): TSH, T4TOTAL, T3FREE, THYROIDAB in the last 72 hours.  Invalid input(s): FREET3 ------------------------------------------------------------------------------------------------------------------ No results for  input(s): VITAMINB12, FOLATE, FERRITIN, TIBC, IRON, RETICCTPCT in the last 72 hours.  Coagulation profile No results for input(s): INR, PROTIME in the last 168 hours.   Recent Labs  11/11/14 1635  DDIMER 2.61*    Cardiac Enzymes  Recent Labs Lab 11/12/14 0719  TROPONINI 0.04*   ------------------------------------------------------------------------------------------------------------------ Invalid input(s): POCBNP  No results for input(s): GLUCAP in the last 72 hours.   RAI,RIPUDEEP M.D. [facility] 11/13/2014, 2:35 PM  Pager: [phone]   Between 7am to 7pm - call Pager - [phone]  After 7pm go to [email or web address] - password [email or web address]  Call night coverage person covering after 7pm

## 2014-11-14 ENCOUNTER — Inpatient Hospital Stay (HOSPITAL_COMMUNITY): Payer: Medicaid Other

## 2014-11-14 ENCOUNTER — Encounter (HOSPITAL_COMMUNITY)
Admission: EM | Disposition: A | Discharge status: Home / Self Care | Payer: Self-pay | Source: Home / Self Care | Attending: Internal Medicine

## 2014-11-14 ENCOUNTER — Inpatient Hospital Stay (HOSPITAL_COMMUNITY): Payer: Medicaid Other | Admitting: Certified Registered Nurse Anesthetist

## 2014-11-14 ENCOUNTER — Encounter (HOSPITAL_COMMUNITY): Payer: Self-pay | Admitting: Certified Registered Nurse Anesthetist

## 2014-11-14 DIAGNOSIS — J869 Pyothorax without fistula: Secondary | ICD-10-CM

## 2014-11-14 HISTORY — PX: PLEURAL EFFUSION DRAINAGE: SHX5099

## 2014-11-14 HISTORY — PX: VIDEO ASSISTED THORACOSCOPY (VATS)/DECORTICATION: SHX6171

## 2014-11-14 LAB — BASIC METABOLIC PANEL
Anion gap: 10 (ref 5–15)
BUN: 12 mg/dL (ref 6–20)
CO2: 31 mmol/L (ref 22–32)
Calcium: 9 mg/dL (ref 8.9–10.3)
Chloride: 95 mmol/L — ABNORMAL LOW (ref 101–111)
Creatinine, Ser: 0.9 mg/dL (ref 0.44–1.00)
GFR calc Af Amer: >60 mL/min (ref >60)
GFR calc non Af Amer: >60 mL/min (ref >60)
GLUCOSE: 123 mg/dL — AB (ref 70–99)
POTASSIUM: 3.6 mmol/L (ref 3.5–5.1)
SODIUM: 136 mmol/L (ref 135–145)

## 2014-11-14 LAB — CBC
HCT: 37.9 % (ref 36.0–46.0)
Hemoglobin: 12.4 g/dL (ref 12.0–15.0)
MCH: 25.8 pg — ABNORMAL LOW (ref 26.0–34.0)
MCHC: 32.7 g/dL (ref 30.0–36.0)
MCV: 79 fL (ref 78.0–100.0)
Platelets: 474 10*3/uL — ABNORMAL HIGH (ref 150–400)
RBC: 4.8 MIL/uL (ref 3.87–5.11)
RDW: 14.5 % (ref 11.5–15.5)
WBC: 18.6 10*3/uL — ABNORMAL HIGH (ref 4.0–10.5)

## 2014-11-14 LAB — SURGICAL PCR SCREEN
MRSA, PCR: NEGATIVE
Staphylococcus aureus: NEGATIVE

## 2014-11-14 LAB — ADENOSINE DEAMINASE, FLUID: Adenosine Deaminase, Fluid: 14.6 U/L — ABNORMAL HIGH (ref <7.6)

## 2014-11-14 SURGERY — VIDEO ASSISTED THORACOSCOPY (VATS)/DECORTICATION
Anesthesia: General | Site: Chest | Laterality: Left

## 2014-11-14 MED ORDER — FENTANYL CITRATE (PF) 100 MCG/2ML IJ SOLN
INTRAMUSCULAR | Status: AC
  Administered 2014-11-14: 50 ug
  Filled 2014-11-14: qty 2

## 2014-11-14 MED ORDER — MIDAZOLAM HCL 2 MG/2ML IJ SOLN
INTRAMUSCULAR | Status: AC
  Administered 2014-11-14: 1 mg
  Filled 2014-11-14: qty 2

## 2014-11-14 MED ORDER — ALBUMIN HUMAN 5 % IV SOLN
INTRAVENOUS | Status: DC | PRN
  Administered 2014-11-14: 14:00:00 via INTRAVENOUS

## 2014-11-14 MED ORDER — ROCURONIUM BROMIDE 100 MG/10ML IV SOLN
INTRAVENOUS | Status: DC | PRN
  Administered 2014-11-14: 10 mg via INTRAVENOUS
  Administered 2014-11-14: 5 mg via INTRAVENOUS
  Administered 2014-11-14: 30 mg via INTRAVENOUS
  Administered 2014-11-14: 5 mg via INTRAVENOUS

## 2014-11-14 MED ORDER — LIDOCAINE HCL (CARDIAC) 20 MG/ML IV SOLN
INTRAVENOUS | Status: AC
  Filled 2014-11-14: qty 5

## 2014-11-14 MED ORDER — ACETAMINOPHEN 500 MG PO TABS
1000.0000 mg | ORAL_TABLET | Freq: Four times a day (QID) | ORAL | Status: DC
  Administered 2014-11-15 – 2014-11-19 (×16): 1000 mg via ORAL
  Filled 2014-11-14 (×18): qty 2

## 2014-11-14 MED ORDER — GLYCOPYRROLATE 0.2 MG/ML IJ SOLN
INTRAMUSCULAR | Status: DC | PRN
  Administered 2014-11-14: 0.6 mg via INTRAVENOUS

## 2014-11-14 MED ORDER — DEXAMETHASONE SODIUM PHOSPHATE 10 MG/ML IJ SOLN
INTRAMUSCULAR | Status: AC
  Filled 2014-11-14: qty 1

## 2014-11-14 MED ORDER — OXYCODONE HCL 5 MG PO TABS
ORAL_TABLET | ORAL | Status: AC
Start: 2014-11-14 — End: 2014-11-14
  Administered 2014-11-14: 10 mg via ORAL
  Filled 2014-11-14: qty 3

## 2014-11-14 MED ORDER — PHENYLEPHRINE HCL 10 MG/ML IJ SOLN
10.0000 mg | INTRAVENOUS | Status: DC | PRN
  Administered 2014-11-14: 20 ug/min via INTRAVENOUS

## 2014-11-14 MED ORDER — GLYCOPYRROLATE 0.2 MG/ML IJ SOLN
INTRAMUSCULAR | Status: AC
  Filled 2014-11-14: qty 1

## 2014-11-14 MED ORDER — HYDROMORPHONE HCL 1 MG/ML IJ SOLN
INTRAMUSCULAR | Status: AC
  Administered 2014-11-14: 0.5 mg via INTRAVENOUS
  Filled 2014-11-14: qty 1

## 2014-11-14 MED ORDER — LABETALOL HCL 5 MG/ML IV SOLN
INTRAVENOUS | Status: AC
  Filled 2014-11-14: qty 4

## 2014-11-14 MED ORDER — ACETAMINOPHEN 160 MG/5ML PO SOLN: 1000.0000 mg | Freq: Four times a day (QID) | ORAL | Status: DC

## 2014-11-14 MED ORDER — FENTANYL CITRATE (PF) 250 MCG/5ML IJ SOLN
INTRAMUSCULAR | Status: AC
  Filled 2014-11-14: qty 5

## 2014-11-14 MED ORDER — NALOXONE HCL 0.4 MG/ML IJ SOLN: 0.4000 mg | INTRAMUSCULAR | Status: DC | PRN

## 2014-11-14 MED ORDER — NEOSTIGMINE METHYLSULFATE 10 MG/10ML IV SOLN
INTRAVENOUS | Status: DC | PRN
  Administered 2014-11-14: 4 mg via INTRAVENOUS

## 2014-11-14 MED ORDER — PROPOFOL 10 MG/ML IV BOLUS
INTRAVENOUS | Status: DC | PRN
  Administered 2014-11-14: 150 mg via INTRAVENOUS
  Administered 2014-11-14: 50 mg via INTRAVENOUS
  Administered 2014-11-14: 40 mg via INTRAVENOUS

## 2014-11-14 MED ORDER — MIDAZOLAM HCL 2 MG/2ML IJ SOLN
INTRAMUSCULAR | Status: AC
  Filled 2014-11-14: qty 2

## 2014-11-14 MED ORDER — HYDROMORPHONE HCL 1 MG/ML IJ SOLN
0.2500 mg | INTRAMUSCULAR | Status: DC | PRN
  Administered 2014-11-14 (×4): 0.5 mg via INTRAVENOUS

## 2014-11-14 MED ORDER — DEXTROSE-NACL 5-0.9 % IV SOLN
INTRAVENOUS | Status: DC
  Administered 2014-11-14 – 2014-11-15 (×2): via INTRAVENOUS

## 2014-11-14 MED ORDER — PROPOFOL 10 MG/ML IV BOLUS
INTRAVENOUS | Status: AC
  Filled 2014-11-14: qty 20

## 2014-11-14 MED ORDER — SUCCINYLCHOLINE CHLORIDE 20 MG/ML IJ SOLN
INTRAMUSCULAR | Status: DC | PRN
  Administered 2014-11-14: 100 mg via INTRAVENOUS

## 2014-11-14 MED ORDER — ONDANSETRON HCL 4 MG/2ML IJ SOLN
INTRAMUSCULAR | Status: AC
  Filled 2014-11-14: qty 2

## 2014-11-14 MED ORDER — OXYCODONE HCL 5 MG/5ML PO SOLN: 5.0000 mg | Freq: Once | ORAL | Status: AC | PRN

## 2014-11-14 MED ORDER — BISACODYL 5 MG PO TBEC
10.0000 mg | DELAYED_RELEASE_TABLET | Freq: Every day | ORAL | Status: DC
  Administered 2014-11-15 – 2014-11-22 (×5): 10 mg via ORAL
  Filled 2014-11-14 (×5): qty 2

## 2014-11-14 MED ORDER — LISINOPRIL 40 MG PO TABS
40.0000 mg | ORAL_TABLET | Freq: Every day | ORAL | Status: DC
  Administered 2014-11-15 – 2014-11-19 (×5): 40 mg via ORAL
  Filled 2014-11-14 (×5): qty 1

## 2014-11-14 MED ORDER — LABETALOL HCL 5 MG/ML IV SOLN
INTRAVENOUS | Status: DC | PRN
  Administered 2014-11-14 (×2): 2.5 mg via INTRAVENOUS

## 2014-11-14 MED ORDER — LACTATED RINGERS IV SOLN
INTRAVENOUS | Status: DC
  Administered 2014-11-14: 11:00:00 via INTRAVENOUS

## 2014-11-14 MED ORDER — LIDOCAINE HCL (CARDIAC) 20 MG/ML IV SOLN
INTRAVENOUS | Status: DC | PRN
  Administered 2014-11-14: 40 mg via INTRAVENOUS

## 2014-11-14 MED ORDER — ALBUTEROL SULFATE (2.5 MG/3ML) 0.083% IN NEBU
2.5000 mg | INHALATION_SOLUTION | RESPIRATORY_TRACT | Status: DC
  Administered 2014-11-14 – 2014-11-15 (×2): 2.5 mg via RESPIRATORY_TRACT
  Filled 2014-11-14 (×2): qty 3

## 2014-11-14 MED ORDER — FENTANYL CITRATE (PF) 100 MCG/2ML IJ SOLN
INTRAMUSCULAR | Status: DC | PRN
  Administered 2014-11-14: 50 ug via INTRAVENOUS
  Administered 2014-11-14: 100 ug via INTRAVENOUS
  Administered 2014-11-14 (×4): 50 ug via INTRAVENOUS

## 2014-11-14 MED ORDER — ONDANSETRON HCL 4 MG/2ML IJ SOLN
INTRAMUSCULAR | Status: DC | PRN
  Administered 2014-11-14: 4 mg via INTRAVENOUS

## 2014-11-14 MED ORDER — VECURONIUM BROMIDE 10 MG IV SOLR
INTRAVENOUS | Status: AC
  Filled 2014-11-14: qty 10

## 2014-11-14 MED ORDER — DIPHENHYDRAMINE HCL 50 MG/ML IJ SOLN: 12.5000 mg | Freq: Four times a day (QID) | INTRAMUSCULAR | Status: DC | PRN

## 2014-11-14 MED ORDER — POTASSIUM CHLORIDE 10 MEQ/50ML IV SOLN
10.0000 meq | Freq: Every day | INTRAVENOUS | Status: DC | PRN
  Filled 2014-11-14: qty 50

## 2014-11-14 MED ORDER — ONDANSETRON HCL 4 MG/2ML IJ SOLN: 4.0000 mg | Freq: Four times a day (QID) | INTRAMUSCULAR | Status: DC | PRN

## 2014-11-14 MED ORDER — ACETAMINOPHEN 325 MG PO TABS
325.0000 mg | ORAL_TABLET | ORAL | Status: DC | PRN
Start: 2014-11-14 — End: 2014-11-14

## 2014-11-14 MED ORDER — VECURONIUM BROMIDE 10 MG IV SOLR
INTRAVENOUS | Status: DC | PRN
  Administered 2014-11-14 (×2): 1 mg via INTRAVENOUS

## 2014-11-14 MED ORDER — DIPHENHYDRAMINE HCL 12.5 MG/5ML PO ELIX
12.5000 mg | ORAL_SOLUTION | Freq: Four times a day (QID) | ORAL | Status: DC | PRN
  Filled 2014-11-14: qty 5

## 2014-11-14 MED ORDER — OXYCODONE HCL 5 MG PO TABS
5.0000 mg | ORAL_TABLET | Freq: Once | ORAL | Status: AC | PRN
  Administered 2014-11-14: 5 mg via ORAL

## 2014-11-14 MED ORDER — SODIUM CHLORIDE 0.9 % IJ SOLN: 9.0000 mL | INTRAMUSCULAR | Status: DC | PRN

## 2014-11-14 MED ORDER — HYDRALAZINE HCL 20 MG/ML IJ SOLN
INTRAMUSCULAR | Status: AC
  Filled 2014-11-14: qty 1

## 2014-11-14 MED ORDER — OXYCODONE HCL 5 MG PO TABS
5.0000 mg | ORAL_TABLET | ORAL | Status: DC | PRN
  Administered 2014-11-14 – 2014-11-20 (×27): 10 mg via ORAL
  Administered 2014-11-21: 5 mg via ORAL
  Filled 2014-11-14 (×27): qty 2

## 2014-11-14 MED ORDER — 0.9 % SODIUM CHLORIDE (POUR BTL) OPTIME
TOPICAL | Status: DC | PRN
  Administered 2014-11-14: 2000 mL

## 2014-11-14 MED ORDER — SENNOSIDES-DOCUSATE SODIUM 8.6-50 MG PO TABS
1.0000 | ORAL_TABLET | Freq: Every day | ORAL | Status: DC
  Administered 2014-11-14 – 2014-11-15 (×2): 1 via ORAL
  Filled 2014-11-14 (×6): qty 1

## 2014-11-14 MED ORDER — ACETAMINOPHEN 160 MG/5ML PO SOLN
325.0000 mg | ORAL | Status: DC | PRN
Start: 2014-11-14 — End: 2014-11-14
  Filled 2014-11-14: qty 20.3

## 2014-11-14 MED ORDER — NEOSTIGMINE METHYLSULFATE 10 MG/10ML IV SOLN
INTRAVENOUS | Status: AC
  Filled 2014-11-14: qty 1

## 2014-11-14 MED ORDER — FENTANYL 10 MCG/ML IV SOLN
INTRAVENOUS | Status: DC
  Administered 2014-11-14: 17:00:00 via INTRAVENOUS
  Administered 2014-11-14: 180 ug via INTRAVENOUS
  Administered 2014-11-15: 75 ug via INTRAVENOUS
  Administered 2014-11-15: 15 ug via INTRAVENOUS
  Filled 2014-11-14: qty 50

## 2014-11-14 MED ORDER — HYDRALAZINE HCL 20 MG/ML IJ SOLN
10.0000 mg | Freq: Once | INTRAMUSCULAR | Status: AC
  Administered 2014-11-14: 10 mg via INTRAVENOUS

## 2014-11-14 MED ORDER — LACTATED RINGERS IV SOLN
INTRAVENOUS | Status: DC | PRN
  Administered 2014-11-14 (×2): via INTRAVENOUS

## 2014-11-14 MED ORDER — DEXAMETHASONE SODIUM PHOSPHATE 10 MG/ML IJ SOLN
INTRAMUSCULAR | Status: DC | PRN
  Administered 2014-11-14: 10 mg via INTRAVENOUS

## 2014-11-14 MED ORDER — ONDANSETRON HCL 4 MG/2ML IJ SOLN
4.0000 mg | Freq: Four times a day (QID) | INTRAMUSCULAR | Status: DC | PRN
  Filled 2014-11-14: qty 2

## 2014-11-14 MED ORDER — MORPHINE SULFATE 2 MG/ML IJ SOLN: 1.0000 mg | INTRAMUSCULAR | Status: DC | PRN

## 2014-11-14 MED ORDER — GLYCOPYRROLATE 0.2 MG/ML IJ SOLN
INTRAMUSCULAR | Status: AC
  Filled 2014-11-14: qty 3

## 2014-11-14 MED ORDER — METOPROLOL TARTRATE 100 MG PO TABS
100.0000 mg | ORAL_TABLET | Freq: Two times a day (BID) | ORAL | Status: DC
  Administered 2014-11-14 – 2014-11-16 (×6): 100 mg via ORAL
  Filled 2014-11-14 (×8): qty 1

## 2014-11-14 MED ORDER — TRAMADOL HCL 50 MG PO TABS
50.0000 mg | ORAL_TABLET | Freq: Four times a day (QID) | ORAL | Status: DC | PRN
  Administered 2014-11-15 – 2014-11-17 (×7): 100 mg via ORAL
  Filled 2014-11-14 (×7): qty 2

## 2014-11-14 SURGICAL SUPPLY — 80 items
ADH SKN CLS APL DERMABOND .7 (GAUZE/BANDAGES/DRESSINGS) ×2
APL SRG 22X2 LUM MLBL SLNT (VASCULAR PRODUCTS)
APPLICATOR TIP EXT COSEAL (VASCULAR PRODUCTS) IMPLANT
BAG SPEC RTRVL LRG 6X4 10 (ENDOMECHANICALS)
CANISTER SUCTION 2500CC (MISCELLANEOUS) ×4 IMPLANT
CATH KIT ON Q 5IN SLV (PAIN MANAGEMENT) IMPLANT
CATH THORACIC 28FR (CATHETERS) IMPLANT
CATH THORACIC 28FR RT ANG (CATHETERS) IMPLANT
CATH THORACIC 36FR (CATHETERS) IMPLANT
CATH THORACIC 36FR RT ANG (CATHETERS) IMPLANT
CLIP TI MEDIUM 6 (CLIP) ×4 IMPLANT
CONN ST 1/4X3/8  BEN (MISCELLANEOUS) ×4
CONN ST 1/4X3/8 BEN (MISCELLANEOUS) IMPLANT
CONN Y 3/8X3/8X3/8  BEN (MISCELLANEOUS) ×2
CONN Y 3/8X3/8X3/8 BEN (MISCELLANEOUS) IMPLANT
CONT SPEC 4OZ CLIKSEAL STRL BL (MISCELLANEOUS) ×14 IMPLANT
COVER SURGICAL LIGHT HANDLE (MISCELLANEOUS) ×4 IMPLANT
DERMABOND ADVANCED (GAUZE/BANDAGES/DRESSINGS) ×2
DERMABOND ADVANCED .7 DNX12 (GAUZE/BANDAGES/DRESSINGS) IMPLANT
DRAPE LAPAROSCOPIC ABDOMINAL (DRAPES) ×4 IMPLANT
DRAPE WARM FLUID 44X44 (DRAPE) ×4 IMPLANT
ELECT BLADE 6.5 EXT (BLADE) ×6 IMPLANT
ELECT REM PT RETURN 9FT ADLT (ELECTROSURGICAL) ×4
ELECTRODE REM PT RTRN 9FT ADLT (ELECTROSURGICAL) ×2 IMPLANT
GAUZE SPONGE 4X4 12PLY STRL (GAUZE/BANDAGES/DRESSINGS) ×4 IMPLANT
GLOVE BIO SURGEON STRL SZ 6.5 (GLOVE) ×1 IMPLANT
GLOVE BIO SURGEON STRL SZ7.5 (GLOVE) ×2 IMPLANT
GLOVE BIO SURGEONS STRL SZ 6.5 (GLOVE) ×1
GLOVE BIOGEL PI IND STRL 6.5 (GLOVE) IMPLANT
GLOVE BIOGEL PI IND STRL 7.0 (GLOVE) IMPLANT
GLOVE BIOGEL PI INDICATOR 6.5 (GLOVE) ×4
GLOVE BIOGEL PI INDICATOR 7.0 (GLOVE) ×4
GLOVE SURG SIGNA 7.5 PF LTX (GLOVE) ×8 IMPLANT
GOWN STRL REUS W/ TWL LRG LVL3 (GOWN DISPOSABLE) ×4 IMPLANT
GOWN STRL REUS W/ TWL XL LVL3 (GOWN DISPOSABLE) ×2 IMPLANT
GOWN STRL REUS W/TWL LRG LVL3 (GOWN DISPOSABLE) ×8
GOWN STRL REUS W/TWL XL LVL3 (GOWN DISPOSABLE) ×4
HANDLE STAPLE ENDO GIA SHORT (STAPLE)
HEMOSTAT SURGICEL 2X14 (HEMOSTASIS) IMPLANT
KIT BASIN OR (CUSTOM PROCEDURE TRAY) ×4 IMPLANT
KIT ROOM TURNOVER OR (KITS) ×4 IMPLANT
NS IRRIG 1000ML POUR BTL (IV SOLUTION) ×12 IMPLANT
PACK CHEST (CUSTOM PROCEDURE TRAY) ×4 IMPLANT
PAD ARMBOARD 7.5X6 YLW CONV (MISCELLANEOUS) ×8 IMPLANT
POUCH ENDO CATCH II 15MM (MISCELLANEOUS) IMPLANT
POUCH SPECIMEN RETRIEVAL 10MM (ENDOMECHANICALS) IMPLANT
SEALANT PROGEL (MISCELLANEOUS) IMPLANT
SEALANT SURG COSEAL 4ML (VASCULAR PRODUCTS) IMPLANT
SEALANT SURG COSEAL 8ML (VASCULAR PRODUCTS) IMPLANT
SOLUTION ANTI FOG 6CC (MISCELLANEOUS) ×4 IMPLANT
SPONGE GAUZE 4X4 12PLY STER LF (GAUZE/BANDAGES/DRESSINGS) ×2 IMPLANT
SPONGE INTESTINAL PEANUT (DISPOSABLE) ×16 IMPLANT
STAPLER ENDO GIA 12 SHRT THIN (STAPLE) IMPLANT
STAPLER ENDO GIA 12MM SHORT (STAPLE) IMPLANT
SUT PROLENE 4 0 RB 1 (SUTURE)
SUT PROLENE 4-0 RB1 .5 CRCL 36 (SUTURE) IMPLANT
SUT SILK  1 MH (SUTURE) ×10
SUT SILK 1 MH (SUTURE) ×4 IMPLANT
SUT SILK 1 TIES 10X30 (SUTURE) ×4 IMPLANT
SUT SILK 2 0SH CR/8 30 (SUTURE) IMPLANT
SUT SILK 3 0SH CR/8 30 (SUTURE) IMPLANT
SUT VIC AB 1 CTX 36 (SUTURE) ×4
SUT VIC AB 1 CTX36XBRD ANBCTR (SUTURE) IMPLANT
SUT VIC AB 2-0 CTX 36 (SUTURE) ×2 IMPLANT
SUT VIC AB 3-0 MH 27 (SUTURE) IMPLANT
SUT VIC AB 3-0 X1 27 (SUTURE) ×2 IMPLANT
SUT VICRYL 2 TP 1 (SUTURE) IMPLANT
SWAB COLLECTION DEVICE MRSA (MISCELLANEOUS) ×2 IMPLANT
SYSTEM SAHARA CHEST DRAIN RE-I (WOUND CARE) ×4 IMPLANT
TAPE CLOTH 4X10 WHT NS (GAUZE/BANDAGES/DRESSINGS) ×4 IMPLANT
TAPE CLOTH SURG 4X10 WHT LF (GAUZE/BANDAGES/DRESSINGS) ×2 IMPLANT
TIP APPLICATOR SPRAY EXTEND 16 (VASCULAR PRODUCTS) IMPLANT
TOWEL OR 17X24 6PK STRL BLUE (TOWEL DISPOSABLE) ×8 IMPLANT
TOWEL OR 17X26 10 PK STRL BLUE (TOWEL DISPOSABLE) ×8 IMPLANT
TRAP SPECIMEN MUCOUS 40CC (MISCELLANEOUS) ×4 IMPLANT
TRAY FOLEY CATH 16FRSI W/METER (SET/KITS/TRAYS/PACK) ×4 IMPLANT
TROCAR XCEL BLADELESS 5X75MML (TROCAR) ×4 IMPLANT
TUBE ANAEROBIC SPECIMEN COL (MISCELLANEOUS) ×2 IMPLANT
TUNNELER SHEATH ON-Q 11GX8 DSP (PAIN MANAGEMENT) IMPLANT
WATER STERILE IRR 1000ML POUR (IV SOLUTION) ×8 IMPLANT

## 2014-11-14 NOTE — Interval H&P Note (Signed)
History and Physical Interval Note:  11/14/2014 11:02 AM  Emily Key  has presented today for surgery, with the diagnosis of large left pleural effusion  The various methods of treatment have been discussed with the patient and family. After consideration of risks, benefits and other options for treatment, the patient has consented to  Procedure(s): VIDEO ASSISTED THORACOSCOPY (VATS)/DECORTICATION (Left) DRAINAGE OF PLEURAL EFFUSION (Left) as a surgical intervention .  The patient's history has been reviewed, patient examined, no change in status, stable for surgery.  I have reviewed the patient's chart and labs.  Questions were answered to the patient's satisfaction.     Melrose Nakayama

## 2014-11-14 NOTE — Brief Op Note (Addendum)
11/11/2014 - 11/14/2014       [address]       [place] [address]             [phone]     11/11/2014 - 11/14/2014  2:40 PM  PATIENT:  Emily Key  44 y.o. female  PRE-OPERATIVE DIAGNOSIS:  large left pleural effusion  POST-OPERATIVE DIAGNOSIS:  large left pleural effusion/ empyema/ lung abscess  PROCEDURE:  Procedure(s): VIDEO ASSISTED THORACOSCOPY (VATS)/DECORTICATION DRAINAGE OF PLEURAL EFFUSION  SURGEON:  Surgeon(s): Melrose Nakayama, MD  PHYSICIAN ASSISTANT: Jadene Pierini PA-C  ANESTHESIA:   general  SPECIMEN:  Source of Specimen:  cultures and pleural peel  DISPOSITION OF SPECIMEN:  Pathology  DRAINS: 3  Chest Tube(s) in the left hemithorax   PATIENT CONDITION:  PACU - hemodynamically stable.  PRE-OPERATIVE WEIGHT: 93kg  EBL: 594 ml  Complications: no known  FINDINGS: 2 liters of pleural fluid. Multiple loculated areas. Lung abscess in superior segment of lower lobe. Extensive visceral pleural peel.

## 2014-11-14 NOTE — Anesthesia Procedure Notes (Addendum)
Procedure Name: Intubation Date/Time: 11/14/2014 11:35 AM Performed by: Emily Key Pre-anesthesia Checklist: Patient identified, Emergency Drugs available, Suction available and Patient being monitored Patient Re-evaluated:Patient Re-evaluated prior to inductionOxygen Delivery Method: Circle system utilized Preoxygenation: Pre-oxygenation with 100% oxygen Intubation Type: IV induction and Rapid sequence Ventilation: Mask ventilation with difficulty, Oral airway inserted - appropriate to patient size and Two handed mask ventilation required Laryngoscope Size: Miller, 2, Mac, 3, 4 and Glidescope Grade View: Grade III Tube type: Oral Tube size: 8.0 mm Number of attempts: 4 Airway Equipment and Method: Stylet,  Oral airway and Video-laryngoscopy Placement Confirmation: ETT inserted through vocal cords under direct vision,  positive ETCO2 and breath sounds checked- equal and bilateral Tube secured with: Tape Dental Injury: Teeth and Oropharynx as per pre-operative assessment  Difficulty Due To: Difficulty was unanticipated, Difficult Airway- due to anterior larynx and Difficult Airway- due to limited oral opening Future Recommendations: Recommend- induction with short-acting agent, and alternative techniques readily available Comments: Patient with very difficult intubation. Difficult to mask ventilate. Required two handed mask with oral airway. DL x 1 with Sabra Heck 2 by CRNA to obtain grade 2 view. Unable to pass DLT. DL x 1 by MD with Mac 3 to obtain grade 3 view. DL x 2 by MD with Mac 4 to obtain same view. DL x 3 by MD with glidescope 4 to obtain grade 1 view. Patient with very large epiglottis and very anterior airway. Unable to pass DLT with glidescope. Placed 8.0 cuffed ETT.    Procedure Name: Intubation Date/Time: 11/14/2014 11:45 AM Performed by: Emily Key Pre-anesthesia Checklist: Patient identified, Emergency Drugs available, Suction available and Patient being monitored Oxygen  Delivery Method: Circle system utilized Preoxygenation: Pre-oxygenation with 100% oxygen Intubation Type: IV induction Endobronchial tube: Double lumen EBT, Left, EBT position confirmed by fiberoptic bronchoscope and EBT position confirmed by auscultation and 37 Fr Number of attempts: 1 Airway Equipment and Method: Bougie stylet,  Stylet and Video-laryngoscopy Placement Confirmation: ETT inserted through vocal cords under direct vision,  positive ETCO2 and breath sounds checked- equal and bilateral Tube secured with: Tape Dental Injury: Teeth and Oropharynx as per pre-operative assessment  Comments: Due to difficult airway, placed Cooke catheter through 8.0 cuffed ETT. Removed cuffed ETT and placed 37 DLT using video laryngoscopy to confirm placement.

## 2014-11-14 NOTE — Transfer of Care (Signed)
Immediate Anesthesia Transfer of Care Note  Patient: Emily Key  Procedure(s) Performed: Procedure(s): LEFT VIDEO ASSISTED THORACOSCOPY (VATS)/DECORTICATION (Left) DRAINAGE OF LEFT PLEURAL EFFUSION (Left)  Patient Location: PACU  Anesthesia Type:General  Level of Consciousness: awake, alert  and oriented  Airway & Oxygen Therapy: Patient Spontanous Breathing and Patient connected to face mask oxygen  Post-op Assessment: Report given to RN, Post -op Vital signs reviewed and stable and Patient moving all extremities X 4  Post vital signs: Reviewed and stable  Last Vitals:  Filed Vitals:   11/14/14 0925  BP: 196/99  Pulse: 89  Temp: 36.7 C  Resp:     Complications: No apparent anesthesia complications

## 2014-11-14 NOTE — Plan of Care (Signed)
Problem: Phase I Progression Outcomes Goal: Hemodynamically stable Outcome: Progressing Receiving hydralazine for sbp >165 every 4 hours prn. Received in PACU at 1605.

## 2014-11-14 NOTE — Progress Notes (Signed)
To OR. Report given to short stay.

## 2014-11-14 NOTE — Anesthesia Preprocedure Evaluation (Addendum)
Anesthesia Evaluation  Patient identified by MRN, date of birth, ID band Patient awake    Reviewed: Allergy & Precautions, NPO status , Patient's Chart, lab work & pertinent test results  History of Anesthesia Complications Negative for: history of anesthetic complications  Airway Mallampati: II  TM Distance: >3 FB Neck ROM: Full    Dental  (+) Dental Advisory Given, Teeth Intact   Pulmonary shortness of breath and at rest, pneumonia -, unresolved, neg COPDCurrent Smoker,    + decreased breath sounds      Cardiovascular hypertension, Pt. on medications and Pt. on home beta blockers + CAD and +CHF Rhythm:Regular     Neuro/Psych negative neurological ROS  negative psych ROS   GI/Hepatic negative GI ROS, Neg liver ROS,   Endo/Other  Morbid obesity  Renal/GU negative Renal ROS     Musculoskeletal   Abdominal   Peds  Hematology   Anesthesia Other Findings   Reproductive/Obstetrics                           Anesthesia Physical Anesthesia Plan  ASA: III  Anesthesia Plan: General   Post-op Pain Management:    Induction: Intravenous  Airway Management Planned: Double Lumen EBT  Additional Equipment: Arterial line, CVP and Ultrasound Guidance Line Placement  Intra-op Plan:   Post-operative Plan: Extubation in OR and Possible Post-op intubation/ventilation  Informed Consent: I have reviewed the patients History and Physical, chart, labs and discussed the procedure including the risks, benefits and alternatives for the proposed anesthesia with the patient or authorized representative who has indicated his/her understanding and acceptance.   Dental advisory given  Plan Discussed with: CRNA, Anesthesiologist and Surgeon  Anesthesia Plan Comments:         Anesthesia Quick Evaluation

## 2014-11-14 NOTE — H&P (View-Only) (Signed)
Reason for Consult:Loculated left pleural effusion Referring Physician: Dr. Berna Bue Emily Key is an 44 y.o. female.  HPI: 44 yo woman with a history of hypertension and tobacco abuse. She also has a history of CHF after her last pregnancy, but denies any history of CAD. She has had a cough and general malaise for about 2 weeks. On Saturday she noted rather abrupt onset of left sided chest pain. It was worse with cough and deep breathing. She developed progressive shortness of breath. She presented to the ED yesterday with worsening symptoms.  She had a CXR and chest CT which showed a large left pleural effusion. Thoracentesis was performed today and 300 ml of fluid was drawn off. She did have some improvement of her dyspnea after the thoracentesis. The fluid is exudative.  Past Medical History  Diagnosis Date  . Coronary artery disease   . Hypertension   . CHF (congestive heart failure)     after last pregnancy    Past Surgical History  Procedure Laterality Date  . Tonsillectomy    . Tubal ligation      Family History  Problem Relation Age of Onset  . Diabetes Mellitus II Mother   . Hypertension Mother   . Lung cancer Father     Social History:  reports that she has been smoking Cigarettes.  She has been smoking about 0.25 packs per day. She does not have any smokeless tobacco history on file. She reports that she does not drink alcohol or use illicit drugs.  Allergies: No Known Allergies  Medications:  Scheduled: . azithromycin  500 mg Oral QHS  . cefTRIAXone (ROCEPHIN)  IV  1 g Intravenous Q24H  . docusate sodium  100 mg Oral BID  . enoxaparin (LOVENOX) injection  40 mg Subcutaneous Q24H  . hydrochlorothiazide  12.5 mg Oral Daily  . ketorolac  30 mg Intravenous 3 times per day  . lisinopril  5 mg Oral Daily  . metoprolol tartrate  25 mg Oral BID  . polyethylene glycol  17 g Oral Daily    Results for orders placed or performed during the hospital encounter of  11/11/14 (from the past 48 hour(s))  Basic metabolic panel     Status: Abnormal   Collection Time: 11/11/14  4:35 PM  Result Value Ref Range   Sodium 135 135 - 145 mmol/L   Potassium 3.3 (L) 3.5 - 5.1 mmol/L   Chloride 97 (L) 101 - 111 mmol/L   CO2 25 22 - 32 mmol/L   Glucose, Bld 119 (H) 70 - 99 mg/dL   BUN 9 6 - 20 mg/dL   Creatinine, Ser 0.88 0.44 - 1.00 mg/dL   Calcium 9.2 8.9 - 10.3 mg/dL   GFR calc non Af Amer >60 >60 mL/min   GFR calc Af Amer >60 >60 mL/min    Comment: (NOTE) The eGFR has been calculated using the CKD EPI equation. This calculation has not been validated in all clinical situations. eGFR's persistently <90 mL/min signify possible Chronic Kidney Disease.    Anion gap 13 5 - 15  CBC     Status: Abnormal   Collection Time: 11/11/14  4:35 PM  Result Value Ref Range   WBC 18.2 (H) 4.0 - 10.5 K/uL   RBC 5.14 (H) 3.87 - 5.11 MIL/uL   Hemoglobin 13.5 12.0 - 15.0 g/dL   HCT 40.4 36.0 - 46.0 %   MCV 78.6 78.0 - 100.0 fL   MCH 26.3 26.0 - 34.0 pg  MCHC 33.4 30.0 - 36.0 g/dL   RDW 14.1 11.5 - 15.5 %   Platelets 391 150 - 400 K/uL  D-dimer, quantitative     Status: Abnormal   Collection Time: 11/11/14  4:35 PM  Result Value Ref Range   D-Dimer, Quant 2.61 (H) 0.00 - 0.48 ug/mL-FEU    Comment:        AT THE INHOUSE ESTABLISHED CUTOFF VALUE OF 0.48 ug/mL FEU, THIS ASSAY HAS BEEN DOCUMENTED IN THE LITERATURE TO HAVE A SENSITIVITY AND NEGATIVE PREDICTIVE VALUE OF AT LEAST 98 TO 99%.  THE TEST RESULT SHOULD BE CORRELATED WITH AN ASSESSMENT OF THE CLINICAL PROBABILITY OF DVT / VTE.   I-stat troponin, ED  (not at [facility], [facility])     Status: None   Collection Time: 11/11/14  5:37 PM  Result Value Ref Range   Troponin i, poc 0.00 0.00 - 0.08 ng/mL   Comment 3            Comment: Due to the release kinetics of cTnI, a negative result within the first hours of the onset of symptoms does not rule out myocardial infarction with certainty. If myocardial infarction is  still suspected, repeat the test at appropriate intervals.   I-Stat Troponin, ED (not at [facility])     Status: None   Collection Time: 11/11/14  8:10 PM  Result Value Ref Range   Troponin i, poc 0.01 0.00 - 0.08 ng/mL   Comment 3            Comment: Due to the release kinetics of cTnI, a negative result within the first hours of the onset of symptoms does not rule out myocardial infarction with certainty. If myocardial infarction is still suspected, repeat the test at appropriate intervals.   Pregnancy, urine     Status: None   Collection Time: 11/11/14 10:41 PM  Result Value Ref Range   Preg Test, Ur NEGATIVE NEGATIVE    Comment:        THE SENSITIVITY OF THIS METHODOLOGY IS >20 mIU/mL.   Urine rapid drug screen (hosp performed)     Status: Abnormal   Collection Time: 11/11/14 10:41 PM  Result Value Ref Range   Opiates POSITIVE (A) NONE DETECTED   Cocaine NONE DETECTED NONE DETECTED   Benzodiazepines NONE DETECTED NONE DETECTED   Amphetamines NONE DETECTED NONE DETECTED   Tetrahydrocannabinol POSITIVE (A) NONE DETECTED   Barbiturates NONE DETECTED NONE DETECTED    Comment:        DRUG SCREEN FOR MEDICAL PURPOSES ONLY.  IF CONFIRMATION IS NEEDED FOR ANY PURPOSE, NOTIFY LAB WITHIN 5 DAYS.        LOWEST DETECTABLE LIMITS FOR URINE DRUG SCREEN Drug Class       Cutoff (ng/mL) Amphetamine      1000 Barbiturate      200 Benzodiazepine   301 Tricyclics       601 Opiates          300 Cocaine          300 THC              50   Legionella antigen, urine     Status: None   Collection Time: 11/11/14 10:41 PM  Result Value Ref Range   Specimen Description URINE, RANDOM    Special Requests NONE    Legionella Antigen, Urine      Negative for Legionella pneumophila serogroup 1  Legionella pneumophila serogroup 1 antigen can be detected in urine within 2 to 3 days of infection and may persist even after treatment. This   assay does not detect other Legionella species or serogroups. Performed at [facility]    Report Status 11/12/2014 FINAL   Strep pneumoniae urinary antigen     Status: None   Collection Time: 11/11/14 10:41 PM  Result Value Ref Range   Strep Pneumo Urinary Antigen NEGATIVE NEGATIVE    Comment:        Infection due to S. pneumoniae cannot be absolutely ruled out since the antigen present may be below the detection limit of the test.   CBC     Status: Abnormal   Collection Time: 11/11/14 10:42 PM  Result Value Ref Range   WBC 17.4 (H) 4.0 - 10.5 K/uL   RBC 4.80 3.87 - 5.11 MIL/uL   Hemoglobin 12.6 12.0 - 15.0 g/dL   HCT 38.0 36.0 - 46.0 %   MCV 79.2 78.0 - 100.0 fL   MCH 26.3 26.0 - 34.0 pg   MCHC 33.2 30.0 - 36.0 g/dL   RDW 14.2 11.5 - 15.5 %   Platelets 391 150 - 400 K/uL  Creatinine, serum     Status: None   Collection Time: 11/11/14 10:42 PM  Result Value Ref Range   Creatinine, Ser 0.94 0.44 - 1.00 mg/dL   GFR calc non Af Amer >60 >60 mL/min   GFR calc Af Amer >60 >60 mL/min    Comment: (NOTE) The eGFR has been calculated using the CKD EPI equation. This calculation has not been validated in all clinical situations. eGFR's persistently <90 mL/min signify possible Chronic Kidney Disease.   HIV antibody     Status: None   Collection Time: 11/11/14 10:42 PM  Result Value Ref Range   HIV Screen 4th Generation wRfx Non Reactive Non Reactive    Comment: (NOTE) Performed At: [facility] [place], [place] [id number] Lindon Romp MD YI:[id number]   Influenza panel by pcr     Status: None   Collection Time: 11/11/14 10:43 PM  Result Value Ref Range   Influenza A By PCR NEGATIVE NEGATIVE   Influenza B By PCR NEGATIVE NEGATIVE   H1N1 flu by pcr NOT DETECTED NOT DETECTED    Comment:        The Xpert Flu assay (FDA approved for nasal aspirates or washes and nasopharyngeal swab specimens), is intended as an aid in the diagnosis  of influenza and should not be used as a sole basis for treatment.   Brain natriuretic peptide     Status: Abnormal   Collection Time: 11/12/14  3:55 AM  Result Value Ref Range   B Natriuretic Peptide 138.6 (H) 0.0 - 100.0 pg/mL  Comprehensive metabolic panel     Status: Abnormal   Collection Time: 11/12/14  7:19 AM  Result Value Ref Range   Sodium 136 135 - 145 mmol/L   Potassium 3.5 3.5 - 5.1 mmol/L   Chloride 97 (L) 101 - 111 mmol/L   CO2 25 22 - 32 mmol/L   Glucose, Bld 77 70 - 99 mg/dL   BUN 9 6 - 20 mg/dL   Creatinine, Ser 0.88 0.44 - 1.00 mg/dL   Calcium 8.8 (L) 8.9 - 10.3 mg/dL   Total Protein 7.2 6.5 - 8.1 g/dL   Albumin 2.7 (L) 3.5 - 5.0 g/dL   AST 14 (L) 15 - 41 U/L   ALT 10 (L)  14 - 54 U/L   Alkaline Phosphatase 82 38 - 126 U/L   Total Bilirubin 0.8 0.3 - 1.2 mg/dL   GFR calc non Af Amer >60 >60 mL/min   GFR calc Af Amer >60 >60 mL/min    Comment: (NOTE) The eGFR has been calculated using the CKD EPI equation. This calculation has not been validated in all clinical situations. eGFR's persistently <90 mL/min signify possible Chronic Kidney Disease.    Anion gap 14 5 - 15  CBC with Differential/Platelet     Status: Abnormal   Collection Time: 11/12/14  7:19 AM  Result Value Ref Range   WBC 18.3 (H) 4.0 - 10.5 K/uL   RBC 4.72 3.87 - 5.11 MIL/uL   Hemoglobin 12.4 12.0 - 15.0 g/dL   HCT 38.5 36.0 - 46.0 %   MCV 81.6 78.0 - 100.0 fL   MCH 26.3 26.0 - 34.0 pg   MCHC 32.2 30.0 - 36.0 g/dL   RDW 14.6 11.5 - 15.5 %   Platelets 409 (H) 150 - 400 K/uL   Neutrophils Relative % 83 (H) 43 - 77 %   Neutro Abs 15.3 (H) 1.7 - 7.7 K/uL   Lymphocytes Relative 8 (L) 12 - 46 %   Lymphs Abs 1.5 0.7 - 4.0 K/uL   Monocytes Relative 9 3 - 12 %   Monocytes Absolute 1.6 (H) 0.1 - 1.0 K/uL   Eosinophils Relative 0 0 - 5 %   Eosinophils Absolute 0.0 0.0 - 0.7 K/uL   Basophils Relative 0 0 - 1 %   Basophils Absolute 0.0 0.0 - 0.1 K/uL  Troponin I     Status: Abnormal    Collection Time: 11/12/14  7:19 AM  Result Value Ref Range   Troponin I 0.04 (H) <0.031 ng/mL    Comment:        PERSISTENTLY INCREASED TROPONIN VALUES IN THE RANGE OF 0.04-0.49 ng/mL CAN BE SEEN IN:       -UNSTABLE ANGINA       -CONGESTIVE HEART FAILURE       -MYOCARDITIS       -CHEST TRAUMA       -ARRYHTHMIAS       -LATE PRESENTING MYOCARDIAL INFARCTION       -COPD   CLINICAL FOLLOW-UP RECOMMENDED.   Lactate dehydrogenase (CSF, pleural or peritoneal fluid)     Status: Abnormal   Collection Time: 11/12/14 11:44 AM  Result Value Ref Range   LD, Fluid 436 (H) 3 - 23 U/L    Comment: (NOTE) Results should be evaluated in conjunction with serum values    Fluid Type-FLDH FLUID     Comment: RIGHT PLEURAL CORRECTED ON 05/04 AT 1323: PREVIOUSLY REPORTED AS Pleural, L   Protein, pleural or peritoneal fluid     Status: None   Collection Time: 11/12/14 11:44 AM  Result Value Ref Range   Total protein, fluid 4.9 g/dL    Comment: (NOTE) No normal range established for this test Results should be evaluated in conjunction with serum values    Fluid Type-FTP FLUID     Comment: RIGHT PLEURAL CORRECTED ON 05/04 AT 1324: PREVIOUSLY REPORTED AS Pleural, L   Body fluid cell count with differential     Status: Abnormal   Collection Time: 11/12/14 11:44 AM  Result Value Ref Range   Fluid Type-FCT FLUID     Comment: RIGHT PLEURAL CORRECTED ON 05/04 AT 1324: PREVIOUSLY REPORTED AS THORACENTESIS    Color, Fluid YELLOW (A) YELLOW  Appearance, Fluid HAZY (A) CLEAR   WBC, Fluid 1493 (H) 0 - 1000 cu mm   Neutrophil Count, Fluid 75 (H) 0 - 25 %   Lymphs, Fluid 22 %   Monocyte-Macrophage-Serous Fluid 3 (L) 50 - 90 %  Lactate dehydrogenase     Status: None   Collection Time: 11/12/14  3:00 PM  Result Value Ref Range   LDH 145 98 - 192 U/L  Protein, total     Status: None   Collection Time: 11/12/14  3:00 PM  Result Value Ref Range   Total Protein 7.3 6.5 - 8.1 g/dL  Cholesterol, total      Status: None   Collection Time: 11/12/14  3:00 PM  Result Value Ref Range   Cholesterol 120 0 - 200 mg/dL    Dg Chest 2 View  11/11/2014   CLINICAL DATA:  Left-sided chest pain and shortness of breath for 4 days  EXAM: CHEST  2 VIEW  COMPARISON:  June 21, 2013  FINDINGS: There is extensive consolidation throughout the left lower lobe. There is questionable infiltrate in the lingula as well. Right lung is clear. Heart is mildly enlarged with pulmonary vascularity within normal limits. No adenopathy.  IMPRESSION: Extensive left lower lobe airspace consolidation. There may also be consolidation in a portion of the lingula. Right lung is clear. No change in cardiac prominence.   Electronically Signed   By: Lowella Grip III M.D.   On: 11/11/2014 15:48   Ct Angio Chest Pe W/cm &/or Wo Cm  11/12/2014   CLINICAL DATA:  Chest pain and short of breath.  EXAM: CT ANGIOGRAPHY CHEST WITH CONTRAST  TECHNIQUE: Multidetector CT imaging of the chest was performed using the standard protocol during bolus administration of intravenous contrast. Multiplanar CT image reconstructions and MIPs were obtained to evaluate the vascular anatomy.  CONTRAST:  119m OMNIPAQUE IOHEXOL 350 MG/ML SOLN  COMPARISON:  Plain film 11/11/2014, CT abdomen 10/14/2013  FINDINGS: Mediastinum/Nodes: There is no filling defects within the proximal main pulmonary arteries or the interlobar pulmonary arteries. Evaluation of the lower lobe segmental pulmonary arteries is degraded by patient respiratory motion.  No acute findings of the aorta great vessels. No pericardial fluid. Left ventricle appears mildly enlarged. No mediastinal lymphadenopathy.  Lungs/Pleura: There is large left pleural effusion which occupies 70% of the left hemi thorax volume. There is atelectasis of the left upper lobe and left lower lobe associated with these large pleural effusion. There is no clear obstructing mass lesion obstructing the bronchus. The lung parenchyma  itself is difficult to evaluate. The right lung is clear without evidence of fusion. There is mild atelectasis the right lung base.  Upper abdomen: Limited view of the liver, kidneys, pancreas are unremarkable. Normal adrenal glands.  No aggressive osseous lesion  Musculoskeletal: No aggressive osseous lesion  Review of the MIP images confirms the above findings.  IMPRESSION: 1. No evidence of acute pulmonary embolism within the main pulmonary arteries or intralobar pulmonary arteries. The lower lobe pulmonary arteries are poorly evaluated due to patient respiratory motion. 2. Large left pleural effusion occupying 70% of the left hemi thorax volume. There is passive atelectasis of the left lower lobe and left upper lobe. No obstructing mass lesion is identified.   Electronically Signed   By: SSuzy BouchardM.D.   On: 11/12/2014 01:08   Dg Chest Port 1 View  11/12/2014   CLINICAL DATA:  Post thoracentesis.  EXAM: PORTABLE CHEST - 1 VIEW  COMPARISON:  11/11/2014  FINDINGS: Large left pleural effusion with left lower lobe atelectasis or infiltrate. No pneumothorax following left thoracentesis. No scratch head minimal right base opacity, likely atelectasis. Cardiomegaly. No acute bony abnormality.  IMPRESSION: Large left pleural effusion with left lower lobe atelectasis or infiltrate. No pneumothorax.   Electronically Signed   By: Rolm Baptise M.D.   On: 11/12/2014 12:27    Review of Systems  Constitutional: Positive for fever and malaise/fatigue.  Respiratory: Positive for cough, sputum production and shortness of breath. Negative for hemoptysis.        Left sided pleuritic CP  Cardiovascular: Negative for chest pain (no anginal pain).  Gastrointestinal: Negative for nausea, vomiting and abdominal pain.  All other systems reviewed and are negative.  Blood pressure 178/98, pulse 92, temperature 97.5 F (36.4 C), temperature source Oral, resp. rate 19, height _0  (1.727 m), weight 205 lb 1.6 oz (93.033  kg), last menstrual period 11/11/2014, SpO2 98 %. Physical Exam  Vitals reviewed. Constitutional: She is oriented to person, place, and time. She appears distressed (mildly).  obese  Cardiovascular: Normal rate, regular rhythm and normal heart sounds.   No murmur heard. Respiratory: She has no wheezes.  Increased WOB, absent BS lower 2/3 of left chest  GI: Soft. There is no tenderness.  Musculoskeletal: She exhibits no edema.  Neurological: She is alert and oriented to person, place, and time. No cranial nerve deficit.  Skin: Skin is warm and dry.    Assessment/Plan: 44 yo woman presents with left sided pleuritic CP and shortness of breath. She has a large loculated left pleural effusion that is an exudate. This is consistent with a pneumonia complicated by a para-pneumonic effusion.  The best option to completely clear the pleural space and re-expand the lung is a left VATS, drainage of effusion and decortication.   I discussed the general nature of the procedure, the need for general anesthesia, and the incisions to be used with the patient. We discussed the expected hospital stay, overall recovery and short and long term outcomes. I reviewed the indications, risks, benefits, and alternatives with Ms. Hon. She understands the risks include, but are not limited to death, stroke, MI, DVT/PE, bleeding, possible need for transfusion, infections, air leaks, cardiac arrhythmias, and other organ system dysfunction including respiratory, renal, or GI complications. She accepts the risks and agrees to proceed.  She is currently on appropriate antibiotic coverage.  Her BP control needs to improve.  As it stands currently surgery will likely be Friday 5/6  Melrose Nakayama 11/12/2014, 6:21 PM

## 2014-11-14 NOTE — Progress Notes (Signed)
  [facility] BRIEF SICU PROGRESS NOTE  Day of Surgery  S/P Procedure(s) (LRB): LEFT VIDEO ASSISTED THORACOSCOPY (VATS)/DECORTICATION (Left) DRAINAGE OF LEFT PLEURAL EFFUSION (Left)   Awake and alert, breathing comfortably.  Adequate analgesia NSR w/ stable BP O2 sats 100% Low volume thin serosanguinous chest tube output, no air leak  Plan: Continue routine early postop  [name] 11/14/2014 6:47 PM

## 2014-11-14 NOTE — Progress Notes (Signed)
[facility]                                                                              Patient Demographics  Emily Key, is a 44 y.o. female, DOB - Jun 06, 1971, TOI:[id number]  Admit date - 11/11/2014   Admitting Physician Rise Patience, MD  Outpatient Primary MD for the patient is No PCP Per Patient  LOS - 3   Chief Complaint  Patient presents with  . Chest Pain       Brief HPI   Patient is a 44 year old female with hypertension, had not been on antihypertensives due to financial reasons presented to ED with left-sided pleuritic chest pain. Patient reported that her symptoms started with nonproductive cough 5 days prior to admission and in the last 3 days she was having left-sided chest pain on deep inspiration. She also reported some diaphoresis. Chest x-ray showed left-sided pneumonia and patient was admitted for further workup.   Assessment & Plan    Principal Problem:   CAP (community acquired pneumonia)/extensive left-sided consolidation with loculated pleural effusion - CT of the chest showed no pulmonary embolism, however large left pleural effusion occupying 70% of the left hemithorax with passive atelectasis but no obstructive mass lesion is identified. Pulmonary consulted, patient underwent thoracentesis, only 300 mL of cloudy yellow pleural fluid was obtained and studies sent, consistent with empyema, WBCs 1493, 75% neutrophils.   - CT surgery consulted, VATS planned today 5/6 - Continue IV vancomycin and Zosyn  Active Problems: Leukocytosis - Afebrile, continue vancomycin and Zosyn   Uncontrolled hypertension: Patient has not been taking any medications due to financial reasons, was on 3 antihypertensives, last taken in September of last year -Increased metoprolol to 100 mg twice a day, lisinopril to 40 mg daily, HCTZ 12.5 daily    Pleuritic chest pain -Continue pain medication, troponin 0.04, borderline, not consistent with cardiac  chest pain  Code Status:Full code  Family Communication: Discussed in detail with the patient, all imaging results, lab results explained to the patient and sister at the bedside  Disposition Plan: Not medically ready   Time Spent in minutes   25 minutes  Procedures  Thoracentesis CT chest   Consults   Pulmonology CT surgery   DVT Prophylaxis   Lovenox   Medications  Scheduled Meds: . [MAR Hold] docusate sodium  100 mg Oral BID  . [MAR Hold] enoxaparin (LOVENOX) injection  40 mg Subcutaneous Q24H  . [MAR Hold] hydrochlorothiazide  12.5 mg Oral Daily  . [MAR Hold] ketorolac  30 mg Intravenous 3 times per day  . [MAR Hold] lisinopril  40 mg Oral Daily  . [MAR Hold] metoprolol tartrate  100 mg Oral BID  . [MAR Hold] piperacillin-tazobactam (ZOSYN)  IV  3.375 g Intravenous Q8H  . [MAR Hold] polyethylene glycol  17 g Oral Daily  . vancomycin  1,000 mg Intravenous To 283  . [MAR Hold] vancomycin  1,000 mg Intravenous Q8H   Continuous Infusions:  PRN Meds:.[MAR Hold] acetaminophen **OR** [MAR Hold] acetaminophen, [MAR Hold] hydrALAZINE, [MAR Hold]  HYDROmorphone (DILAUDID) injection, [MAR Hold]  morphine injection, [MAR Hold] ondansetron **OR** [MAR Hold] ondansetron (  ZOFRAN) IV, [MAR Hold] traMADol   Antibiotics   Anti-infectives    Start     Dose/Rate Route Frequency Ordered Stop   11/14/14 1100  vancomycin (VANCOCIN) IVPB 1000 mg/200 mL premix     1,000 mg 200 mL/hr over 60 Minutes Intravenous To [facility] 11/13/14 1235 11/15/14 1100   11/13/14 1600  [MAR Hold]  piperacillin-tazobactam (ZOSYN) IVPB 3.375 g     (MAR Hold since 11/14/14 0940)   3.375 g 12.5 mL/hr over 240 Minutes Intravenous Every 8 hours 11/13/14 1518     11/13/14 1600  [MAR Hold]  vancomycin (VANCOCIN) IVPB 1000 mg/200 mL premix     (MAR Hold since 11/14/14 0940)   1,000 mg 200 mL/hr over 60 Minutes Intravenous Every 8 hours 11/13/14 1518     11/12/14 2200  azithromycin (ZITHROMAX) tablet 500  mg  Status:  Discontinued     500 mg Oral Daily at bedtime 11/12/14 1045 11/13/14 1453   11/12/14 2000  cefTRIAXone (ROCEPHIN) 1 g in dextrose 5 % 50 mL IVPB - Premix  Status:  Discontinued     1 g 100 mL/hr over 30 Minutes Intravenous Every 24 hours 11/11/14 2142 11/13/14 1453   11/11/14 2200  azithromycin (ZITHROMAX) 500 mg in dextrose 5 % 250 mL IVPB  Status:  Discontinued     500 mg 250 mL/hr over 60 Minutes Intravenous Every 24 hours 11/11/14 2142 11/12/14 1045   11/11/14 1930  azithromycin (ZITHROMAX) 500 mg in dextrose 5 % 250 mL IVPB  Status:  Discontinued     500 mg 250 mL/hr over 60 Minutes Intravenous  Once 11/11/14 1925 11/11/14 2256   11/11/14 1930  cefTRIAXone (ROCEPHIN) 1 g in dextrose 5 % 50 mL IVPB     1 g 100 mL/hr over 30 Minutes Intravenous  Once 11/11/14 1925 11/11/14 2042        Subjective:   Emily Key was seen and examined today. Complaining of headache, no chest pain. Afebrile, denies any abdominal pain, N/V/D/C, new weakness, numbess, tingling. BP still very uncontrolled . Family at the bedside  Objective:   Blood pressure 196/99, pulse 89, temperature 98 F (36.7 C), temperature source Oral, resp. rate 17, height 5\' 8"  (1.727 m), weight 93.033 kg (205 lb 1.6 oz), last menstrual period 11/11/2014, SpO2 100 %.  Wt Readings from Last 3 Encounters:  11/11/14 93.033 kg (205 lb 1.6 oz)  04/03/14 89.812 kg (198 lb)  10/14/13 90.266 kg (199 lb)     Intake/Output Summary (Last 24 hours) at 11/14/14 0945 Last data filed at 11/13/14 2330  Gross per 24 hour  Intake    120 ml  Output      0 ml  Net    120 ml    Exam  General: Alert and oriented x 3, NAD  HEENT:  PERRLA, EOMI, Anicteic Sclera,  Neck: Supple, no JVD, no masses  CVS: S1 S2clear, RRR  Respiratory: Decreased breath sounds at the bases b/l, left side decreased breath sounds  men: Soft, NT, ND, NBS  Ext: no cyanosis clubbing or edema  Neuro: no new focal neurological  deficits  Skin: No rashes  Psych: Normal affect and demeanor   Data Review   Micro Results Recent Results (from the past 240 hour(s))  Blood culture (routine x 2)     Status: None (Preliminary result)   Collection Time: 11/11/14  7:58 PM  Result Value Ref Range Status   Specimen Description BLOOD RIGHT WRIST  Final  Special Requests BOTTLES DRAWN AEROBIC AND ANAEROBIC 5CC   Final   Culture   Final           BLOOD CULTURE RECEIVED NO GROWTH TO DATE CULTURE WILL BE HELD FOR 5 DAYS BEFORE ISSUING A FINAL NEGATIVE REPORT Performed at [facility]    Report Status PENDING  Incomplete  Blood culture (routine x 2)     Status: None (Preliminary result)   Collection Time: 11/11/14  8:01 PM  Result Value Ref Range Status   Specimen Description BLOOD LEFT WRIST  Final   Special Requests BOTTLES DRAWN AEROBIC AND ANAEROBIC 4CC  Final   Culture   Final           BLOOD CULTURE RECEIVED NO GROWTH TO DATE CULTURE WILL BE HELD FOR 5 DAYS BEFORE ISSUING A FINAL NEGATIVE REPORT Performed at [facility]    Report Status PENDING  Incomplete  AFB culture with smear     Status: None (Preliminary result)   Collection Time: 11/12/14 11:44 AM  Result Value Ref Range Status   Specimen Description FLUID LEFT PLEURAL  Final   Special Requests Normal  Final   Acid Fast Smear   Final    NO ACID FAST BACILLI SEEN Performed at [facility]    Culture   Final    CULTURE WILL BE EXAMINED FOR 6 WEEKS BEFORE ISSUING A FINAL REPORT Performed at [facility]    Report Status PENDING  Incomplete  Body fluid culture     Status: None (Preliminary result)   Collection Time: 11/12/14 11:44 AM  Result Value Ref Range Status   Specimen Description FLUID LEFT PLEURAL  Final   Special Requests Normal  Final   Gram Stain   Final    RARE WBC PRESENT, PREDOMINANTLY PMN NO ORGANISMS SEEN Performed at [facility]    Culture NO GROWTH Performed at [facility]    Final   Report Status PENDING  Incomplete  Surgical pcr screen     Status: None   Collection Time: 11/14/14  5:37 AM  Result Value Ref Range Status   MRSA, PCR NEGATIVE NEGATIVE Final   Staphylococcus aureus NEGATIVE NEGATIVE Final    Comment:        The Xpert SA Assay (FDA approved for NASAL specimens in patients over 64 years of age), is one component of a comprehensive surveillance program.  Test performance has been validated by [facility] for patients greater than or equal to 14 year old. It is not intended to diagnose infection nor to guide or monitor treatment.     Radiology Reports Dg Chest 2 View  11/11/2014   CLINICAL DATA:  Left-sided chest pain and shortness of breath for 4 days  EXAM: CHEST  2 VIEW  COMPARISON:  June 21, 2013  FINDINGS: There is extensive consolidation throughout the left lower lobe. There is questionable infiltrate in the lingula as well. Right lung is clear. Heart is mildly enlarged with pulmonary vascularity within normal limits. No adenopathy.  IMPRESSION: Extensive left lower lobe airspace consolidation. There may also be consolidation in a portion of the lingula. Right lung is clear. No change in cardiac prominence.   Electronically Signed   By: Lowella Grip III M.D.   On: 11/11/2014 15:48   Ct Angio Chest Pe W/cm &/or Wo Cm  11/12/2014   CLINICAL DATA:  Chest pain and short of breath.  EXAM: CT ANGIOGRAPHY CHEST WITH CONTRAST  TECHNIQUE: Multidetector  CT imaging of the chest was performed using the standard protocol during bolus administration of intravenous contrast. Multiplanar CT image reconstructions and MIPs were obtained to evaluate the vascular anatomy.  CONTRAST:  116mL OMNIPAQUE IOHEXOL 350 MG/ML SOLN  COMPARISON:  Plain film 11/11/2014, CT abdomen 10/14/2013  FINDINGS: Mediastinum/Nodes: There is no filling defects within the proximal main pulmonary arteries or the interlobar pulmonary arteries. Evaluation of the lower lobe segmental  pulmonary arteries is degraded by patient respiratory motion.  No acute findings of the aorta great vessels. No pericardial fluid. Left ventricle appears mildly enlarged. No mediastinal lymphadenopathy.  Lungs/Pleura: There is large left pleural effusion which occupies 70% of the left hemi thorax volume. There is atelectasis of the left upper lobe and left lower lobe associated with these large pleural effusion. There is no clear obstructing mass lesion obstructing the bronchus. The lung parenchyma itself is difficult to evaluate. The right lung is clear without evidence of fusion. There is mild atelectasis the right lung base.  Upper abdomen: Limited view of the liver, kidneys, pancreas are unremarkable. Normal adrenal glands.  No aggressive osseous lesion  Musculoskeletal: No aggressive osseous lesion  Review of the MIP images confirms the above findings.  IMPRESSION: 1. No evidence of acute pulmonary embolism within the main pulmonary arteries or intralobar pulmonary arteries. The lower lobe pulmonary arteries are poorly evaluated due to patient respiratory motion. 2. Large left pleural effusion occupying 70% of the left hemi thorax volume. There is passive atelectasis of the left lower lobe and left upper lobe. No obstructing mass lesion is identified.   Electronically Signed   By: Suzy Bouchard M.D.   On: 11/12/2014 01:08   Dg Chest Port 1 View  11/12/2014   CLINICAL DATA:  Post thoracentesis.  EXAM: PORTABLE CHEST - 1 VIEW  COMPARISON:  11/11/2014  FINDINGS: Large left pleural effusion with left lower lobe atelectasis or infiltrate. No pneumothorax following left thoracentesis. No scratch head minimal right base opacity, likely atelectasis. Cardiomegaly. No acute bony abnormality.  IMPRESSION: Large left pleural effusion with left lower lobe atelectasis or infiltrate. No pneumothorax.   Electronically Signed   By: Rolm Baptise M.D.   On: 11/12/2014 12:27    CBC  Recent Labs Lab 11/11/14 2242  11/12/14 0719 11/13/14 0343 11/13/14 1434 11/14/14 0426  WBC 17.4* 18.3* 18.3* 17.9* 18.6*  HGB 12.6 12.4 12.1 12.3 12.4  HCT 38.0 38.5 37.1 38.0 37.9  PLT 391 409* 377 391 474*  MCV 79.2 81.6 80.5 79.8 79.0  MCH 26.3 26.3 26.2 25.8* 25.8*  MCHC 33.2 32.2 32.6 32.4 32.7  RDW 14.2 14.6 14.6 14.3 14.5  LYMPHSABS  --  1.5  --   --   --   MONOABS  --  1.6*  --   --   --   EOSABS  --  0.0  --   --   --   BASOSABS  --  0.0  --   --   --     Chemistries   Recent Labs Lab 11/11/14 1635 11/11/14 2242 11/12/14 0719 11/13/14 0343 11/13/14 1434 11/14/14 0426  NA 135  --  136 136 135 136  K 3.3*  --  3.5 3.5 2.9* 3.6  CL 97*  --  97* 99* 96* 95*  CO2 25  --  25 27 31 31   GLUCOSE 119*  --  77 99 126* 123*  BUN 9  --  9 11 14 12   CREATININE 0.88 0.94 0.88 0.76 0.85  0.90  CALCIUM 9.2  --  8.8* 8.9 8.9 9.0  AST  --   --  14*  --  14*  --   ALT  --   --  10*  --  9*  --   ALKPHOS  --   --  82  --  86  --   BILITOT  --   --  0.8  --  0.7  --    ------------------------------------------------------------------------------------------------------------------ estimated creatinine clearance is 94.1 mL/min (by C-G formula based on Cr of 0.9). ------------------------------------------------------------------------------------------------------------------ No results for input(s): HGBA1C in the last 72 hours. ------------------------------------------------------------------------------------------------------------------  Recent Labs  11/12/14 1500  CHOL 120   ------------------------------------------------------------------------------------------------------------------ No results for input(s): TSH, T4TOTAL, T3FREE, THYROIDAB in the last 72 hours.  Invalid input(s): FREET3 ------------------------------------------------------------------------------------------------------------------ No results for input(s): VITAMINB12, FOLATE, FERRITIN, TIBC, IRON, RETICCTPCT in the last 72  hours.  Coagulation profile  Recent Labs Lab 11/13/14 1434  INR 1.21     Recent Labs  11/11/14 1635  DDIMER 2.61*    Cardiac Enzymes  Recent Labs Lab 11/12/14 0719  TROPONINI 0.04*   ------------------------------------------------------------------------------------------------------------------ Invalid input(s): POCBNP  No results for input(s): GLUCAP in the last 72 hours.   Tawan Corkern M.D. [facility] 11/14/2014, 9:45 AM  Pager: [phone]   Between 7am to 7pm - call Pager - [phone]  After 7pm go to [email or web address] - password [email or web address]  Call night coverage person covering after 7pm

## 2014-11-15 ENCOUNTER — Inpatient Hospital Stay (HOSPITAL_COMMUNITY): Payer: Medicaid Other

## 2014-11-15 LAB — BASIC METABOLIC PANEL
ANION GAP: 8 (ref 5–15)
BUN: 11 mg/dL (ref 6–20)
CO2: 28 mmol/L (ref 22–32)
CREATININE: 0.7 mg/dL (ref 0.44–1.00)
Calcium: 8.3 mg/dL — ABNORMAL LOW (ref 8.9–10.3)
Chloride: 98 mmol/L — ABNORMAL LOW (ref 101–111)
GFR calc non Af Amer: >60 mL/min (ref >60)
Glucose, Bld: 180 mg/dL — ABNORMAL HIGH (ref 70–99)
Potassium: 3.1 mmol/L — ABNORMAL LOW (ref 3.5–5.1)
SODIUM: 134 mmol/L — AB (ref 135–145)

## 2014-11-15 LAB — POCT I-STAT 3, ART BLOOD GAS (G3+)
Acid-Base Excess: 3 mmol/L — ABNORMAL HIGH (ref 0.0–2.0)
Bicarbonate: 28.5 mEq/L — ABNORMAL HIGH (ref 20.0–24.0)
O2 Saturation: 97 %
PH ART: 7.395 (ref 7.350–7.450)
TCO2: 30 mmol/L (ref 0–100)
pCO2 arterial: 46.2 mmHg — ABNORMAL HIGH (ref 35.0–45.0)
pO2, Arterial: 94 mmHg (ref 80.0–100.0)

## 2014-11-15 LAB — CBC
HEMATOCRIT: 34.5 % — AB (ref 36.0–46.0)
HEMOGLOBIN: 11.4 g/dL — AB (ref 12.0–15.0)
MCH: 25.9 pg — AB (ref 26.0–34.0)
MCHC: 33 g/dL (ref 30.0–36.0)
MCV: 78.4 fL (ref 78.0–100.0)
Platelets: 466 10*3/uL — ABNORMAL HIGH (ref 150–400)
RBC: 4.4 MIL/uL (ref 3.87–5.11)
RDW: 14.7 % (ref 11.5–15.5)
WBC: 23.6 10*3/uL — ABNORMAL HIGH (ref 4.0–10.5)

## 2014-11-15 LAB — ANA, BODY FLUID: Anti-Nuclear Ab, IgG: DETECTED — AB

## 2014-11-15 MED ORDER — ALBUTEROL SULFATE (2.5 MG/3ML) 0.083% IN NEBU
2.5000 mg | INHALATION_SOLUTION | Freq: Two times a day (BID) | RESPIRATORY_TRACT | Status: DC
  Administered 2014-11-15 – 2014-11-17 (×5): 2.5 mg via RESPIRATORY_TRACT
  Filled 2014-11-15 (×5): qty 3

## 2014-11-15 MED ORDER — CLONIDINE HCL 0.2 MG/24HR TD PTWK
0.2000 mg | MEDICATED_PATCH | TRANSDERMAL | Status: DC
  Administered 2014-11-15: 0.2 mg via TRANSDERMAL
  Filled 2014-11-15: qty 1

## 2014-11-15 MED ORDER — HYDRALAZINE HCL 20 MG/ML IJ SOLN
20.0000 mg | INTRAMUSCULAR | Status: DC | PRN
  Administered 2014-11-16 – 2014-11-21 (×10): 20 mg via INTRAVENOUS
  Filled 2014-11-15 (×11): qty 1

## 2014-11-15 MED ORDER — LABETALOL HCL 5 MG/ML IV SOLN
10.0000 mg | INTRAVENOUS | Status: DC | PRN
  Administered 2014-11-16 – 2014-11-17 (×6): 10 mg via INTRAVENOUS
  Filled 2014-11-15 (×8): qty 4

## 2014-11-15 MED ORDER — HYDRALAZINE HCL 20 MG/ML IJ SOLN
10.0000 mg | INTRAMUSCULAR | Status: DC | PRN
  Administered 2014-11-15 (×2): 10 mg via INTRAVENOUS
  Filled 2014-11-15 (×2): qty 1

## 2014-11-15 MED ORDER — POTASSIUM CHLORIDE 10 MEQ/50ML IV SOLN
10.0000 meq | INTRAVENOUS | Status: AC
  Administered 2014-11-15 (×3): 10 meq via INTRAVENOUS
  Filled 2014-11-15 (×2): qty 50

## 2014-11-15 NOTE — Anesthesia Postprocedure Evaluation (Signed)
  Anesthesia Post-op Note  Patient: Emily Key  Procedure(s) Performed: Procedure(s): LEFT VIDEO ASSISTED THORACOSCOPY (VATS)/DECORTICATION (Left) DRAINAGE OF LEFT PLEURAL EFFUSION (Left)  Patient Location: ICU  Anesthesia Type:General  Level of Consciousness: awake  Airway and Oxygen Therapy: Patient Spontanous Breathing  Post-op Pain: mild  Post-op Assessment: Post-op Vital signs reviewed, Patient's Cardiovascular Status Stable, Respiratory Function Stable, Patent Airway, No signs of Nausea or vomiting and Pain level controlled  Post-op Vital Signs: Reviewed and stable  Last Vitals:  Filed Vitals:   11/15/14 1400  BP: 179/90  Pulse:   Temp:   Resp: 18    Complications: No apparent anesthesia complications

## 2014-11-15 NOTE — Op Note (Signed)
NAMEMarland Kitchen  CHANIA, KOCHANSKI NO.:  [id number]  MEDICAL RECORD NO.:  [id number]  LOCATION:  2S05C                        FACILITY:  [facility]  PHYSICIAN:  Revonda Standard. Roxan Hockey, M.D.DATE OF BIRTH:  1970/10/20  DATE OF PROCEDURE:  11/14/2014 DATE OF DISCHARGE:                              OPERATIVE REPORT   PREOPERATIVE DIAGNOSIS:  Left parapneumonic effusion.  POSTOPERATIVE DIAGNOSIS:  Left empyema secondary to abscess in superior segment of left lower lobe.  PROCEDURE:  Left video-assisted thoracoscopy, drainage of empyema, visceral pleural decortication.  SURGEON:  Revonda Standard. Roxan Hockey, M.D.  ASSISTANT:  John Giovanni, PA-C.  ANESTHESIA:  General.  FINDINGS:  Approximately 2 L of fluid drained from multiple loculated collections. Extensive visceral pleural peel. Minimal parietal pleural peel. Abscess in superior segment of the lower lobe. Good re- expansion of the lung post decortication.  CLINICAL NOTE:  Ms. Higinbotham is a 44 year old woman who had about a 2- week history of cough and malaise. She then presented with chest pain and shortness of breath of abrupt onset.  She was found to have a large pleural effusion.  This was exudative and loculated.  The clinical picture was consistent with a left parapneumonic effusion.  She was advised to undergo left video-assisted thoracoscopy for drainage of the effusion and possible decortication.  The indications, risks, benefits, and alternatives were discussed in detail with the patient.  She understood and accepted the risks and agreed to proceed.  OPERATIVE NOTE:  Ms. Trahan was brought to the preoperative holding area on Nov 14, 2014.  Anesthesia placed a central line and arterial blood pressure monitoring line.  She was taken to the operating room, anesthetized, and intubated with a double-lumen endotracheal tube. Sequential compressive devices were placed on the calves for DVT prophylaxis.  A Foley catheter was  placed.  She was placed in a right lateral decubitus position, and the left chest was prepped and draped in the usual sterile fashion.  Vancomycin and Zosyn had been started preoperatively. Single lung ventilation of the right lung was initiated and was tolerated well throughout the procedure.  An incision was made in the midaxillary line in the seventh intercostal space.  The chest was entered bluntly using a hemostat.  A sucker was placed into the chest and yellow, slightly murky fluid was evacuated.  A portion of this was sent for cultures, the remainder was sent for cytology.  Approximately 1 L was drained initially.  A port was placed through the incision and the thoracoscope was advanced into the chest. There were multiple loculations.  A small working incision was made in the fifth interspace anterolaterally.  A sponge clamp was used to break up the loculationsand the lung was freed from areas of adhesions.  Approximately, 2 L of fluid was drained in all.  There was a minimal parietal pleural exudate, but there was an extensive peel on the visceral pleura.  After freeing the lung up circumferentially, work was started on the visceral pleural peel, this was dissected off with careful blunt dissection beginning on the diaphragmatic surface working over the margin of the inferior portion of the lower lobe and then working up along the lower lobe  into the fissure.  The peel was thicker around the superior segment and also along the diaphragm and in other areas that the peel was relatively thin and easier to remove.  With decortication of the superior segment, there was a mass noted with frank pus.  This pus was cultured as was the pleural peel.  Next, the upper lobe was decorticated in a similar fashion.  After achieving what initially appeared to be relatively good result with decortication, the left lung was reinflated. There were multiple areas where there still was a constriction of  the lung and incomplete re-expansion.  These areas were decorticated with frequent intermittent inflations of the left lung to make sure there was good re-expansion.  Finally, there was sufficient re-expansion of both the upper and lower lobes.  A small amount of parietal pleural peel was removed. The chest was copiously irrigated with warm saline.  An inspection was made for hemostasis.  Two additional small incisions were made for chest tube placement.  A 28-French chest tube was placed through the most anterior one and directed to the apex.  A 28-French Blake drain was placed through the middle incision along the diaphragmatic surface and finally a 39 Blake drain was placed through the more posterior, the original port incision and directed posteriorly to the apex.  All were secured with #1 silk sutures.  The lower and middle lobes were reinflated and two-lung ventilation was resumed.  The working incision was closed with a #1 Vicryl fascial suture, 2-0 Vicryl subcutaneous suture, and a 3-0 Vicryl subcuticular suture.  All sponge, needle, and instrument counts were correct at the end of the procedure.  The patient was placed back in a supine position.  The chest tubes were placed to suction.  She was extubated in the operating room and taken to the postanesthetic care unit in good condition.     Revonda Standard Roxan Hockey, M.D.     SCH/MEDQ  D:  11/14/2014  T:  11/15/2014  Job:  [id number]

## 2014-11-15 NOTE — Treatment Plan (Signed)
Patient is status post VATS/decortication, left, postop day #1, currently under CT surgery, Dr. Roxan Hockey. [facility] service will assume care once patient is back on the floor or stepdown. Please let me know when transfer is planned. Highly appreciate cardiothoracic surgery care for the patient.   RAI,RIPUDEEP M.D. [facility] 11/15/2014, 9:44 AM  Pager: [phone]

## 2014-11-15 NOTE — Progress Notes (Signed)
      [address]       [place] [address]             [phone]        CARDIOTHORACIC SURGERY PROGRESS NOTE   R1 Day Post-Op Procedure(s) (LRB): LEFT VIDEO ASSISTED THORACOSCOPY (VATS)/DECORTICATION (Left) DRAINAGE OF LEFT PLEURAL EFFUSION (Left)  Subjective: Looks good.  Denies much pain.  Not using PCA.  No SOB.  Poor appetite  Objective: Vital signs: BP Readings from Last 1 Encounters:  11/15/14 172/92   Pulse Readings from Last 1 Encounters:  11/15/14 66   Resp Readings from Last 1 Encounters:  11/15/14 32   Temp Readings from Last 1 Encounters:  11/15/14 96.6 F (35.9 C) Oral    Hemodynamics:    Physical Exam:  Rhythm:   sinus  Breath sounds: clear  Heart sounds:  RRR  Incisions:  Dressings dry, intact  Abdomen:  Soft, non-distended, non-tender  Extremities:  Warm, well-perfused  Chest tubes:  Low volume thin serosanguinous output, no air leak    Intake/Output from previous day: 05/06 0701 - 05/07 0700 In: 4996.3 [P.O.:120; I.V.:3526.3; IV Piggyback:1350] Out: 1725 [Urine:1185; Blood:200; Chest Tube:340] Intake/Output this shift: Total I/O In: 650 [I.V.:375; IV Piggyback:275] Out: 85 [Urine:85]  Lab Results:  CBC: Recent Labs  11/14/14 0426 11/15/14 0430  WBC 18.6* 23.6*  HGB 12.4 11.4*  HCT 37.9 34.5*  PLT 474* 466*    BMET:  Recent Labs  11/14/14 0426 11/15/14 0430  NA 136 134*  K 3.6 3.1*  CL 95* 98*  CO2 31 28  GLUCOSE 123* 180*  BUN 12 11  CREATININE 0.90 0.70  CALCIUM 9.0 8.3*     CBG (last 3)  No results for input(s): GLUCAP in the last 72 hours.  ABG    Component Value Date/Time   PHART 7.395 11/15/2014 0428   PCO2ART 46.2* 11/15/2014 0428   PO2ART 94.0 11/15/2014 0428   HCO3 28.5* 11/15/2014 0428   TCO2 30 11/15/2014 0428   O2SAT 97.0 11/15/2014 0428    CXR: PORTABLE CHEST - 1 VIEW  COMPARISON: 11/14/2014  FINDINGS: There are 3 left chest tubes. There is a right jugular central  line extending into the SVC. There is no pneumothorax. There is left base consolidation. The right lung is clear.  IMPRESSION: Chest tubes appear unchanged in position. No pneumothorax. Unchanged left base consolidation.   Electronically Signed  By: Andreas Newport M.D.  On: 11/15/2014 06:20  Assessment/Plan: S/P Procedure(s) (LRB): LEFT VIDEO ASSISTED THORACOSCOPY (VATS)/DECORTICATION (Left) DRAINAGE OF LEFT PLEURAL EFFUSION (Left)  Doing very well POD1 CXR looks good w/ excellent reexpansion left lung Mobilize D/C PCA D/C IV fluids D/C foley Continue antibiotics Leave tubes in for now, plan to d/c anterior tube and place remaining tubes to water seal tomorrow if CXR stable Transfer step down  [name] 11/15/2014 10:17 AM

## 2014-11-15 NOTE — Progress Notes (Signed)
[facility] BRIEF SICU PROGRESS NOTE  1 Day Post-Op  S/P Procedure(s) (LRB): LEFT VIDEO ASSISTED THORACOSCOPY (VATS)/DECORTICATION (Left) DRAINAGE OF LEFT PLEURAL EFFUSION (Left)   Stable day but still quite hypertensive despite receiving antihypertensive meds and IV hydralazine frequently  Plan: Will add Clonidine patch  [name] 11/15/2014 10:11 PM

## 2014-11-16 ENCOUNTER — Inpatient Hospital Stay (HOSPITAL_COMMUNITY): Payer: Medicaid Other

## 2014-11-16 LAB — URINALYSIS, ROUTINE W REFLEX MICROSCOPIC
Bilirubin Urine: NEGATIVE
GLUCOSE, UA: NEGATIVE mg/dL
Ketones, ur: NEGATIVE mg/dL
LEUKOCYTES UA: NEGATIVE
Nitrite: NEGATIVE
PH: 6.5 (ref 5.0–8.0)
Protein, ur: NEGATIVE mg/dL
SPECIFIC GRAVITY, URINE: 1.009 (ref 1.005–1.030)
Urobilinogen, UA: 0.2 mg/dL (ref 0.0–1.0)

## 2014-11-16 LAB — BODY FLUID CULTURE
Culture: NO GROWTH
Special Requests: NORMAL

## 2014-11-16 LAB — TSH: TSH: 1.485 u[IU]/mL (ref 0.350–4.500)

## 2014-11-16 LAB — CBC
HCT: 36.6 % (ref 36.0–46.0)
Hemoglobin: 11.8 g/dL — ABNORMAL LOW (ref 12.0–15.0)
MCH: 25.9 pg — ABNORMAL LOW (ref 26.0–34.0)
MCHC: 32.2 g/dL (ref 30.0–36.0)
MCV: 80.4 fL (ref 78.0–100.0)
PLATELETS: 508 10*3/uL — AB (ref 150–400)
RBC: 4.55 MIL/uL (ref 3.87–5.11)
RDW: 14.8 % (ref 11.5–15.5)
WBC: 21.7 10*3/uL — ABNORMAL HIGH (ref 4.0–10.5)

## 2014-11-16 LAB — COMPREHENSIVE METABOLIC PANEL
ALBUMIN: 2.4 g/dL — AB (ref 3.5–5.0)
ALT: 10 U/L — AB (ref 14–54)
AST: 14 U/L — AB (ref 15–41)
Alkaline Phosphatase: 68 U/L (ref 38–126)
Anion gap: 9 (ref 5–15)
BUN: 8 mg/dL (ref 6–20)
CALCIUM: 8.8 mg/dL — AB (ref 8.9–10.3)
CO2: 29 mmol/L (ref 22–32)
Chloride: 96 mmol/L — ABNORMAL LOW (ref 101–111)
Creatinine, Ser: 0.74 mg/dL (ref 0.44–1.00)
GFR calc Af Amer: >60 mL/min (ref >60)
GFR calc non Af Amer: >60 mL/min (ref >60)
Glucose, Bld: 122 mg/dL — ABNORMAL HIGH (ref 70–99)
Potassium: 3.2 mmol/L — ABNORMAL LOW (ref 3.5–5.1)
SODIUM: 134 mmol/L — AB (ref 135–145)
TOTAL PROTEIN: 6.9 g/dL (ref 6.5–8.1)
Total Bilirubin: 0.7 mg/dL (ref 0.3–1.2)

## 2014-11-16 LAB — URINE MICROSCOPIC-ADD ON

## 2014-11-16 LAB — VANCOMYCIN, TROUGH: VANCOMYCIN TR: 16 ug/mL (ref 10.0–20.0)

## 2014-11-16 MED ORDER — SODIUM CHLORIDE 0.9 % IJ SOLN
3.0000 mL | INTRAMUSCULAR | Status: DC | PRN
Start: 2014-11-16 — End: 2014-11-22
  Administered 2014-11-16: 3 mL via INTRAVENOUS
  Filled 2014-11-16: qty 3

## 2014-11-16 MED ORDER — SODIUM CHLORIDE 0.9 % IJ SOLN
3.0000 mL | Freq: Two times a day (BID) | INTRAMUSCULAR | Status: DC
  Administered 2014-11-16 – 2014-11-22 (×12): 3 mL via INTRAVENOUS

## 2014-11-16 MED ORDER — HYDROCHLOROTHIAZIDE 25 MG PO TABS
25.0000 mg | ORAL_TABLET | Freq: Every day | ORAL | Status: DC
  Administered 2014-11-17 – 2014-11-19 (×3): 25 mg via ORAL
  Filled 2014-11-16 (×3): qty 1

## 2014-11-16 MED ORDER — POTASSIUM CHLORIDE 10 MEQ/50ML IV SOLN
10.0000 meq | INTRAVENOUS | Status: AC
  Administered 2014-11-16 (×3): 10 meq via INTRAVENOUS
  Filled 2014-11-16 (×3): qty 50

## 2014-11-16 MED ORDER — SODIUM CHLORIDE 0.9 % IJ SOLN
10.0000 mL | INTRAMUSCULAR | Status: DC | PRN
  Administered 2014-11-16 (×2): 30 mL
  Administered 2014-11-16: 10 mL
  Filled 2014-11-16 (×3): qty 40

## 2014-11-16 MED ORDER — FUROSEMIDE 10 MG/ML IJ SOLN
40.0000 mg | Freq: Once | INTRAMUSCULAR | Status: AC
  Administered 2014-11-16: 40 mg via INTRAVENOUS
  Filled 2014-11-16: qty 4

## 2014-11-16 MED ORDER — SODIUM CHLORIDE 0.9 % IJ SOLN
10.0000 mL | Freq: Two times a day (BID) | INTRAMUSCULAR | Status: DC
  Administered 2014-11-16: 10 mL
  Administered 2014-11-16: 13 mL
  Administered 2014-11-17: 10 mL
  Administered 2014-11-17: 20 mL
  Administered 2014-11-18 – 2014-11-22 (×8): 10 mL

## 2014-11-16 MED ORDER — CLONIDINE HCL 0.3 MG/24HR TD PTWK
0.3000 mg | MEDICATED_PATCH | TRANSDERMAL | Status: DC
  Administered 2014-11-16: 0.3 mg via TRANSDERMAL
  Filled 2014-11-16: qty 1

## 2014-11-16 MED ORDER — HYDROCHLOROTHIAZIDE 12.5 MG PO CAPS
12.5000 mg | ORAL_CAPSULE | Freq: Once | ORAL | Status: AC
  Administered 2014-11-16: 12.5 mg via ORAL
  Filled 2014-11-16: qty 1

## 2014-11-16 MED ORDER — POTASSIUM CHLORIDE CRYS ER 20 MEQ PO TBCR
40.0000 meq | EXTENDED_RELEASE_TABLET | Freq: Two times a day (BID) | ORAL | Status: AC
  Administered 2014-11-16 – 2014-11-17 (×4): 40 meq via ORAL
  Filled 2014-11-16 (×5): qty 2

## 2014-11-16 NOTE — Progress Notes (Addendum)
      [address]       [place] [address]             [phone]        CARDIOTHORACIC SURGERY PROGRESS NOTE   R2 Days Post-Op Procedure(s) (LRB): LEFT VIDEO ASSISTED THORACOSCOPY (VATS)/DECORTICATION (Left) DRAINAGE OF LEFT PLEURAL EFFUSION (Left)  Subjective: Feels well but didn't sleep well last night.  Pain under good control.  Denies SOB  Objective: Vital signs: BP Readings from Last 1 Encounters:  11/16/14 177/96   Pulse Readings from Last 1 Encounters:  11/16/14 67   Resp Readings from Last 1 Encounters:  11/16/14 20   Temp Readings from Last 1 Encounters:  11/16/14 98.1 F (36.7 C) Oral    Hemodynamics:    Physical Exam:  Rhythm:   sinus  Breath sounds: clear  Heart sounds:  RRR  Incisions:  Dressing dry, intact  Abdomen:  Soft, non-distended, non-tender  Extremities:  Warm, well-perfused  Chest tubes:  Low volume thin serosanguinous output, no air leak    Intake/Output from previous day: 05/07 0701 - 05/08 0700 In: 2782.5 [P.O.:1320; I.V.:625; IV Piggyback:837.5] Out: 1425 [Urine:1375; Chest Tube:50] Intake/Output this shift:    Lab Results:  CBC: Recent Labs  11/15/14 0430 11/16/14 0430  WBC 23.6* 21.7*  HGB 11.4* 11.8*  HCT 34.5* 36.6  PLT 466* 508*    BMET:  Recent Labs  11/15/14 0430 11/16/14 0430  NA 134* 134*  K 3.1* 3.2*  CL 98* 96*  CO2 28 29  GLUCOSE 180* 122*  BUN 11 8  CREATININE 0.70 0.74  CALCIUM 8.3* 8.8*     CBG (last 3)  No results for input(s): GLUCAP in the last 72 hours.  ABG    Component Value Date/Time   PHART 7.395 11/15/2014 0428   PCO2ART 46.2* 11/15/2014 0428   PO2ART 94.0 11/15/2014 0428   HCO3 28.5* 11/15/2014 0428   TCO2 30 11/15/2014 0428   O2SAT 97.0 11/15/2014 0428    CXR: PORTABLE CHEST - 1 VIEW  COMPARISON: Yesterday.  FINDINGS: Three left chest tubes remain in place with no pneumothorax seen. Right jugular catheter tip in the superior vena cava.  Stable enlarged cardiac silhouette and left basilar opacity. Unremarkable bones.  IMPRESSION: 1. Three left chest tubes without pneumothorax. 2. Stable cardiomegaly and left basilar atelectasis, pneumonia, pulmonary contusion or re-expansion edema.   Electronically Signed  By: Claudie Revering M.D.  On: 11/16/2014 08:37  Assessment/Plan: S/P Procedure(s) (LRB): LEFT VIDEO ASSISTED THORACOSCOPY (VATS)/DECORTICATION (Left) DRAINAGE OF LEFT PLEURAL EFFUSION (Left)  Overall doing well POD2 VATS for drainage of empyema Clinically doing well w/ no fevers, leukocytosis trending down and excellent reexpansion of left lung, no air leak All cultures sent from OR no growth to date Severe hypertension, refractory despite 3 drug therapy including addition of clonidine patch last night Acute exacerbation of chronic diastolic CHF Hypokalemia, diuretic induced   D/C anterior chest tube  Leave remaining tubes to suction  Mobilize  Increase clonidine patch to TTS-3  Increase HCTZ starting tomorrow, will give 1 dose lasix today  Continue metoprolol and lisinopril  Supplement K+  SCD's for DVT prophylaxis - avoid lovenox/heparin due to extensive surgical dissection and risk of bleeding  Probably needs cardiology consult for long term follow up and management of HTN and CHF - internal medicine team has signed off   Emily Key 11/16/2014 10:13 AM

## 2014-11-16 NOTE — Plan of Care (Signed)
Problem: Phase I Progression Outcomes Goal: Hemodynamically stable Outcome: Progressing Continuing to adjust antihypertensive meds.

## 2014-11-16 NOTE — Progress Notes (Signed)
   11/16/14 0900  Vitals  BP (!) 233/102 mmHg  MAP (mmHg) 137  Pulse Rate 76  ECG Heart Rate 75  Resp (!) 27  Oxygen Therapy  SpO2 99 %  SBP continues > 200. Given 10 mg labetalol IV push slowly.

## 2014-11-16 NOTE — Progress Notes (Signed)
ANTIBIOTIC CONSULT NOTE - INITIAL  Pharmacy Consult for Vancomycin and Zosyn Indication: pneumonia with empyema  No Known Allergies  Patient Measurements: Height: 5\' 8"  (172.7 cm) Weight: 207 lb 7.3 oz (94.1 kg) IBW/kg (Calculated) : 63.9  Vital Signs: Temp: 98.1 F (36.7 C) (05/08 2000) Temp Source: Oral (05/08 2000) BP: 186/96 mmHg (05/08 2015) Pulse Rate: 64 (05/08 2045) Intake/Output from previous day: 05/07 0701 - 05/08 0700 In: 2782.5 [P.O.:1320; I.V.:625; IV Piggyback:837.5] Out: 1425 [Urine:1375; Chest Tube:50] Intake/Output from this shift:    Labs:  Recent Labs  11/14/14 0426 11/15/14 0430 11/16/14 0430  WBC 18.6* 23.6* 21.7*  HGB 12.4 11.4* 11.8*  PLT 474* 466* 508*  CREATININE 0.90 0.70 0.74   Estimated Creatinine Clearance: 106.5 mL/min (by C-G formula based on Cr of 0.74).  Recent Labs  11/16/14 1612  [place] 16     Microbiology: Recent Results (from the past 720 hour(s))  Blood culture (routine x 2)     Status: None (Preliminary result)   Collection Time: 11/11/14  7:58 PM  Result Value Ref Range Status   Specimen Description BLOOD RIGHT WRIST  Final   Special Requests BOTTLES DRAWN AEROBIC AND ANAEROBIC 5CC   Final   Culture   Final           BLOOD CULTURE RECEIVED NO GROWTH TO DATE CULTURE WILL BE HELD FOR 5 DAYS BEFORE ISSUING A FINAL NEGATIVE REPORT Performed at [facility]    Report Status PENDING  Incomplete  Blood culture (routine x 2)     Status: None (Preliminary result)   Collection Time: 11/11/14  8:01 PM  Result Value Ref Range Status   Specimen Description BLOOD LEFT WRIST  Final   Special Requests BOTTLES DRAWN AEROBIC AND ANAEROBIC 4CC  Final   Culture   Final           BLOOD CULTURE RECEIVED NO GROWTH TO DATE CULTURE WILL BE HELD FOR 5 DAYS BEFORE ISSUING A FINAL NEGATIVE REPORT Performed at [facility]    Report Status PENDING  Incomplete  AFB culture with smear     Status: None (Preliminary  result)   Collection Time: 11/12/14 11:44 AM  Result Value Ref Range Status   Specimen Description FLUID LEFT PLEURAL  Final   Special Requests Normal  Final   Acid Fast Smear   Final    NO ACID FAST BACILLI SEEN Performed at [facility]    Culture   Final    CULTURE WILL BE EXAMINED FOR 6 WEEKS BEFORE ISSUING A FINAL REPORT Performed at [facility]    Report Status PENDING  Incomplete  Body fluid culture     Status: None   Collection Time: 11/12/14 11:44 AM  Result Value Ref Range Status   Specimen Description FLUID LEFT PLEURAL  Final   Special Requests Normal  Final   Gram Stain   Final    RARE WBC PRESENT, PREDOMINANTLY PMN NO ORGANISMS SEEN Performed at [facility]    Culture   Final    NO GROWTH 3 DAYS Performed at [facility]    Report Status 11/16/2014 FINAL  Final  Surgical pcr screen     Status: None   Collection Time: 11/14/14  5:37 AM  Result Value Ref Range Status   MRSA, PCR NEGATIVE NEGATIVE Final   Staphylococcus aureus NEGATIVE NEGATIVE Final    Comment:        The Xpert SA Assay (FDA approved for  NASAL specimens in patients over 62 years of age), is one component of a comprehensive surveillance program.  Test performance has been validated by [facility] for patients greater than or equal to 67 year old. It is not intended to diagnose infection nor to guide or monitor treatment.   Fungus Culture with Smear     Status: None (Preliminary result)   Collection Time: 11/14/14 12:25 PM  Result Value Ref Range Status   Specimen Description FLUID PLEURAL  Final   Special Requests POF VANCY ZOSYN PART B  Final   Fungal Smear   Final    NO YEAST OR FUNGAL ELEMENTS SEEN Performed at [facility]    Culture   Final    CULTURE IN PROGRESS FOR FOUR WEEKS Performed at [facility]    Report Status PENDING  Incomplete  Body fluid culture     Status: None (Preliminary result)   Collection Time: 11/14/14  12:25 PM  Result Value Ref Range Status   Specimen Description FLUID PLEURAL  Final   Special Requests POF VANCY ZOSYN PART B  Final   Gram Stain   Final    NO WBC SEEN NO ORGANISMS SEEN Performed at [facility]    Culture   Final    NO GROWTH 1 DAY Performed at [facility]    Report Status PENDING  Incomplete  AFB culture with smear     Status: None (Preliminary result)   Collection Time: 11/14/14 12:25 PM  Result Value Ref Range Status   Specimen Description FLUID PLEURAL  Final   Special Requests POF VANCY ZOSYN PART B  Final   Acid Fast Smear   Final    NO ACID FAST BACILLI SEEN Performed at [facility]    Culture   Final    CULTURE WILL BE EXAMINED FOR 6 WEEKS BEFORE ISSUING A FINAL REPORT Performed at [facility]    Report Status PENDING  Incomplete  Fungus Culture with Smear     Status: None (Preliminary result)   Collection Time: 11/14/14 12:28 PM  Result Value Ref Range Status   Specimen Description TISSUE PLEURAL  Final   Special Requests POF VANCY ZOSYN PART C  Final   Fungal Smear   Final    NO YEAST OR FUNGAL ELEMENTS SEEN Performed at [facility]    Culture   Final    CULTURE IN PROGRESS FOR FOUR WEEKS Performed at [facility]    Report Status PENDING  Incomplete  Tissue culture     Status: None (Preliminary result)   Collection Time: 11/14/14 12:28 PM  Result Value Ref Range Status   Specimen Description TISSUE PLEURAL  Final   Special Requests POF VANCY ZOSYN PART C  Final   Gram Stain PENDING  Incomplete   Culture   Final    NO GROWTH 1 DAY Performed at [facility]    Report Status PENDING  Incomplete  AFB culture with smear     Status: None (Preliminary result)   Collection Time: 11/14/14 12:28 PM  Result Value Ref Range Status   Specimen Description TISSUE PLEURAL  Final   Special Requests POF VANCY ZOSYN PART C  Final   Acid Fast Smear   Final    NO ACID FAST BACILLI  SEEN Performed at [facility]    Culture   Final    CULTURE WILL BE EXAMINED FOR 6 WEEKS BEFORE ISSUING A FINAL REPORT Performed  at [facility]    Report Status PENDING  Incomplete  Anaerobic culture     Status: None (Preliminary result)   Collection Time: 11/14/14 12:46 PM  Result Value Ref Range Status   Specimen Description WOUND LEFT PLEURAL  Final   Special Requests POF VANCY ZOSYN PART D  Final   Gram Stain   Final    FEW WBC PRESENT,BOTH PMN AND MONONUCLEAR NO SQUAMOUS EPITHELIAL CELLS SEEN NO ORGANISMS SEEN Performed at [facility]    Culture   Final    NO ANAEROBES ISOLATED; CULTURE IN PROGRESS FOR 5 DAYS Performed at [facility]    Report Status PENDING  Incomplete  Wound culture     Status: None (Preliminary result)   Collection Time: 11/14/14 12:46 PM  Result Value Ref Range Status   Specimen Description WOUND LEFT PLEURAL  Final   Special Requests POF VANCY ZOSYN PART D  Final   Gram Stain   Final    FEW WBC PRESENT,BOTH PMN AND MONONUCLEAR NO SQUAMOUS EPITHELIAL CELLS SEEN NO ORGANISMS SEEN Performed at [facility]    Culture   Final    NO GROWTH 1 DAY Performed at [facility]    Report Status PENDING  Incomplete  Fungus Culture with Smear     Status: None (Preliminary result)   Collection Time: 11/14/14 12:54 PM  Result Value Ref Range Status   Specimen Description TISSUE LEFT PLEURAL  Final   Special Requests POF VANCY ZOSYN PART E  Final   Fungal Smear   Final    NO YEAST OR FUNGAL ELEMENTS SEEN Performed at [facility]    Culture   Final    CULTURE IN PROGRESS FOR FOUR WEEKS Performed at [facility]    Report Status PENDING  Incomplete  Tissue culture     Status: None (Preliminary result)   Collection Time: 11/14/14 12:54 PM  Result Value Ref Range Status   Specimen Description TISSUE LEFT PLEURAL  Final   Special Requests POF VANCY ZOSYN PART E  Final   Gram Stain    Final    RARE WBC PRESENT, PREDOMINANTLY MONONUCLEAR NO ORGANISMS SEEN Performed at [facility]    Culture   Final    NO GROWTH 1 DAY Performed at [facility]    Report Status PENDING  Incomplete  AFB culture with smear     Status: None (Preliminary result)   Collection Time: 11/14/14 12:54 PM  Result Value Ref Range Status   Specimen Description TISSUE LEFT PLEURAL  Final   Special Requests POF VANCY ZOSYN PART E  Final   Acid Fast Smear   Final    NO ACID FAST BACILLI SEEN Performed at [facility]    Culture   Final    CULTURE WILL BE EXAMINED FOR 6 WEEKS BEFORE ISSUING A FINAL REPORT Performed at [facility]    Report Status PENDING  Incomplete    Medical History: Past Medical History  Diagnosis Date  . Coronary artery disease   . Hypertension   . CHF (congestive heart failure)     after last pregnancy    Assessment: 44 year old female to start on Vancomycin and Zosyn for pneumonia with empyema.  VT is therapeutic at 16 on vancomycin 1g q8h. Pt remains afebrile, WBC 21.7, sCr 0.74.  Goal of Therapy:  Vancomycin trough level 15-20 mcg/ml   Plan:  Zosyn 3.375g IV q8h Vancomycin 1g IV  q8h Follow up Scr, cultures, and clinical progress  Andrey Cota. Diona Foley, PharmD Clinical Pharmacist Pager [phone]  11/16/2014,9:10 PM

## 2014-11-16 NOTE — Progress Notes (Signed)
   11/16/14 1730  Vitals  BP (!) 167/81 mmHg  MAP (mmHg) 101  Pulse Rate 67  ECG Heart Rate 68  Resp 20  Oxygen Therapy  SpO2 97 %  1724-given labetalol 10 mg IV for sustained blood pressure > 165.

## 2014-11-16 NOTE — Progress Notes (Signed)
   11/16/14 1355  Vitals  BP (!) 192/104 mmHg  Hydralazinge 20 mg IV slow push given.

## 2014-11-16 NOTE — Progress Notes (Signed)
   11/16/14 1907  Vitals  BP (!) 221/141 mmHg  Given hydralazine 20 mg IV for sustained bp > 165

## 2014-11-16 NOTE — Progress Notes (Signed)
[facility]                                                                              Patient Demographics  Emily Key, is a 44 y.o. female, DOB - [birth year], WGY:[id number]  Admit date - 11/11/2014   Admitting Physician Rise Patience, MD  Outpatient Primary MD for the patient is No PCP Per Patient  LOS - 5   Chief Complaint  Patient presents with  . Chest Pain       Brief HPI   Patient is a 44 year old female with hypertension, had not been on antihypertensives due to financial reasons presented to ED with left-sided pleuritic chest pain. Patient reported that her symptoms started with nonproductive cough 5 days prior to admission and in the last 3 days she was having left-sided chest pain on deep inspiration. She also reported some diaphoresis. Chest x-ray showed left-sided pneumonia and patient was admitted for further workup.   Assessment & Plan    Principal Problem:   CAP (community acquired pneumonia)/extensive left-sided consolidation with loculated pleural effusion, abscess s/p VATS 5/6 - CT of the chest showed no pulmonary embolism, however large left pleural effusion occupying 70% of the left hemithorax with passive atelectasis but no obstructive mass lesion is identified. Pulmonary consulted, patient underwent thoracentesis, only 300 mL of cloudy yellow pleural fluid was obtained and studies sent, consistent with empyema, WBCs 1493, 75% neutrophils.   - CT surgery consulted, underwent VATS on 5/6 - Continue IV vancomycin and Zosyn - Management per cardiothoracic surgery   Active Problems: Leukocytosis: Likely due to #1 - Afebrile, continue vancomycin and Zosyn   Uncontrolled hypertension: Patient has not been taking any medications due to financial reasons, was on 3 antihypertensives, last taken in September of last year -Continue metoprolol 100 mg twice a day, lisinopril to 40 mg daily, HCTZ 25mg , clonidine 0.3mg , lasix 40mg  x1  - check  an echo to assess if there is LVH, I/O's with a 6.5 L net positive balance, check TSH, renin/aldosterone ratio, catecholamines - Discussed with cardiology, Dr. Conni Elliot, recommended not to start on IV infusion antihypertensive yet as patient was just started on clonidine and Lasix today. He recommended equalvalent dose of labetalol instead of metoprolol or Norvasc or scheduled hydralazine if no significant improvement with clonidine and Lasix today. HR in 60's. - will rule out secondary causes of hypertension, obtain TSH, renin, aldosterone, metanephrines, catecholamines. Repeat UA to rule out proteinuria.     Pleuritic chest pain -Continue pain medication  Code Status:Full code  Family Communication: Discussed in detail with the patient, all imaging results, lab results explained to the patient    Disposition Plan: Not medically ready   Time Spent in minutes   25 minutes  Procedures  Thoracentesis CT chest   Consults   Pulmonology CT surgery   DVT Prophylaxis   Lovenox   Medications  Scheduled Meds: . acetaminophen  1,000 mg Oral 4 times per day  . albuterol  2.5 mg Nebulization BID  . bisacodyl  10 mg Oral Daily  . cloNIDine  0.3 mg Transdermal Weekly  . furosemide  40 mg Intravenous Once  . [  START ON 11/17/2014] hydrochlorothiazide  25 mg Oral Daily  . lisinopril  40 mg Oral Daily  . metoprolol tartrate  100 mg Oral BID  . piperacillin-tazobactam (ZOSYN)  IV  3.375 g Intravenous Q8H  . polyethylene glycol  17 g Oral Daily  . potassium chloride  10 mEq Intravenous Q1 Hr x 3  . potassium chloride  10 mEq Intravenous Q1 Hr x 3  . potassium chloride  40 mEq Oral BID  . senna-docusate  1 tablet Oral QHS  . sodium chloride  10-40 mL Intracatheter Q12H  . sodium chloride  3 mL Intravenous Q12H  . vancomycin  1,000 mg Intravenous Q8H   Continuous Infusions:  PRN Meds:.hydrALAZINE, labetalol, ondansetron (ZOFRAN) IV, oxyCODONE, potassium chloride, sodium chloride, sodium  chloride, traMADol   Antibiotics   Anti-infectives    Start     Dose/Rate Route Frequency Ordered Stop   11/14/14 1100  vancomycin (VANCOCIN) IVPB 1000 mg/200 mL premix  Status:  Discontinued     1,000 mg 200 mL/hr over 60 Minutes Intravenous To [facility] 11/13/14 1235 11/14/14 1708   11/13/14 1600  piperacillin-tazobactam (ZOSYN) IVPB 3.375 g     3.375 g 12.5 mL/hr over 240 Minutes Intravenous Every 8 hours 11/13/14 1518     11/13/14 1600  vancomycin (VANCOCIN) IVPB 1000 mg/200 mL premix     1,000 mg 200 mL/hr over 60 Minutes Intravenous Every 8 hours 11/13/14 1518     11/12/14 2200  azithromycin (ZITHROMAX) tablet 500 mg  Status:  Discontinued     500 mg Oral Daily at bedtime 11/12/14 1045 11/13/14 1453   11/12/14 2000  cefTRIAXone (ROCEPHIN) 1 g in dextrose 5 % 50 mL IVPB - Premix  Status:  Discontinued     1 g 100 mL/hr over 30 Minutes Intravenous Every 24 hours 11/11/14 2142 11/13/14 1453   11/11/14 2200  azithromycin (ZITHROMAX) 500 mg in dextrose 5 % 250 mL IVPB  Status:  Discontinued     500 mg 250 mL/hr over 60 Minutes Intravenous Every 24 hours 11/11/14 2142 11/12/14 1045   11/11/14 1930  azithromycin (ZITHROMAX) 500 mg in dextrose 5 % 250 mL IVPB  Status:  Discontinued     500 mg 250 mL/hr over 60 Minutes Intravenous  Once 11/11/14 1925 11/11/14 2256   11/11/14 1930  cefTRIAXone (ROCEPHIN) 1 g in dextrose 5 % 50 mL IVPB     1 g 100 mL/hr over 30 Minutes Intravenous  Once 11/11/14 1925 11/11/14 2042        Subjective:   Emily Key was seen and examined today. No significant complaints per the patient, no chest pain, overall feeling better however BP is still significantly uncontrolled.  Afebrile, denies any abdominal pain, N/V/D/C, new weakness, numbess, tingling.   Objective:   Blood pressure 177/96, pulse 67, temperature 98.1 F (36.7 C), temperature source Oral, resp. rate 20, height 5\' 8"  (1.727 m), weight 94.1 kg (207 lb 7.3 oz), last menstrual  period 11/11/2014, SpO2 98 %.  Wt Readings from Last 3 Encounters:  11/15/14 94.1 kg (207 lb 7.3 oz)  04/03/14 89.812 kg (198 lb)  10/14/13 90.266 kg (199 lb)     Intake/Output Summary (Last 24 hours) at 11/16/14 1046 Last data filed at 11/16/14 0700  Gross per 24 hour  Intake 1772.5 ml  Output   1340 ml  Net  432.5 ml    Exam  General: Alert and oriented x 3, NAD  HEENT:  PERRLA, EOMI, Anicteic Sclera,  Neck:  Supple, no JVD, no masses  CVS: S1 S2clear, RRR  Respiratory: Decreased breath sounds at the bases   men: Soft, NT, ND, NBS  Ext: no cyanosis clubbing or edema  Neuro: no new focal neurological deficits  Skin: No rashes  Psych: Normal affect and demeanor   Data Review   Micro Results Recent Results (from the past 240 hour(s))  Blood culture (routine x 2)     Status: None (Preliminary result)   Collection Time: 11/11/14  7:58 PM  Result Value Ref Range Status   Specimen Description BLOOD RIGHT WRIST  Final   Special Requests BOTTLES DRAWN AEROBIC AND ANAEROBIC 5CC   Final   Culture   Final           BLOOD CULTURE RECEIVED NO GROWTH TO DATE CULTURE WILL BE HELD FOR 5 DAYS BEFORE ISSUING A FINAL NEGATIVE REPORT Performed at [facility]    Report Status PENDING  Incomplete  Blood culture (routine x 2)     Status: None (Preliminary result)   Collection Time: 11/11/14  8:01 PM  Result Value Ref Range Status   Specimen Description BLOOD LEFT WRIST  Final   Special Requests BOTTLES DRAWN AEROBIC AND ANAEROBIC 4CC  Final   Culture   Final           BLOOD CULTURE RECEIVED NO GROWTH TO DATE CULTURE WILL BE HELD FOR 5 DAYS BEFORE ISSUING A FINAL NEGATIVE REPORT Performed at [facility]    Report Status PENDING  Incomplete  AFB culture with smear     Status: None (Preliminary result)   Collection Time: 11/12/14 11:44 AM  Result Value Ref Range Status   Specimen Description FLUID LEFT PLEURAL  Final   Special Requests Normal  Final   Acid  Fast Smear   Final    NO ACID FAST BACILLI SEEN Performed at [facility]    Culture   Final    CULTURE WILL BE EXAMINED FOR 6 WEEKS BEFORE ISSUING A FINAL REPORT Performed at [facility]    Report Status PENDING  Incomplete  Body fluid culture     Status: None (Preliminary result)   Collection Time: 11/12/14 11:44 AM  Result Value Ref Range Status   Specimen Description FLUID LEFT PLEURAL  Final   Special Requests Normal  Final   Gram Stain   Final    RARE WBC PRESENT, PREDOMINANTLY PMN NO ORGANISMS SEEN Performed at [facility]    Culture   Final    NO GROWTH 2 DAYS Performed at [facility]    Report Status PENDING  Incomplete  Surgical pcr screen     Status: None   Collection Time: 11/14/14  5:37 AM  Result Value Ref Range Status   MRSA, PCR NEGATIVE NEGATIVE Final   Staphylococcus aureus NEGATIVE NEGATIVE Final    Comment:        The Xpert SA Assay (FDA approved for NASAL specimens in patients over 76 years of age), is one component of a comprehensive surveillance program.  Test performance has been validated by [facility] for patients greater than or equal to 62 year old. It is not intended to diagnose infection nor to guide or monitor treatment.   Fungus Culture with Smear     Status: None (Preliminary result)   Collection Time: 11/14/14 12:25 PM  Result Value Ref Range Status   Specimen Description FLUID PLEURAL  Final   Special Requests POF VANCY ZOSYN PART B  Final   Fungal Smear   Final    NO YEAST OR FUNGAL ELEMENTS SEEN Performed at [facility]    Culture   Final    CULTURE IN PROGRESS FOR FOUR WEEKS Performed at [facility]    Report Status PENDING  Incomplete  Body fluid culture     Status: None (Preliminary result)   Collection Time: 11/14/14 12:25 PM  Result Value Ref Range Status   Specimen Description FLUID PLEURAL  Final   Special Requests POF VANCY ZOSYN PART B  Final   Gram Stain    Final    NO WBC SEEN NO ORGANISMS SEEN Performed at [facility]    Culture NO GROWTH Performed at [facility]   Final   Report Status PENDING  Incomplete  AFB culture with smear     Status: None (Preliminary result)   Collection Time: 11/14/14 12:25 PM  Result Value Ref Range Status   Specimen Description FLUID PLEURAL  Final   Special Requests POF VANCY ZOSYN PART B  Final   Acid Fast Smear   Final    NO ACID FAST BACILLI SEEN Performed at [facility]    Culture   Final    CULTURE WILL BE EXAMINED FOR 6 WEEKS BEFORE ISSUING A FINAL REPORT Performed at [facility]    Report Status PENDING  Incomplete  Fungus Culture with Smear     Status: None (Preliminary result)   Collection Time: 11/14/14 12:28 PM  Result Value Ref Range Status   Specimen Description TISSUE PLEURAL  Final   Special Requests POF VANCY ZOSYN PART C  Final   Fungal Smear   Final    NO YEAST OR FUNGAL ELEMENTS SEEN Performed at [facility]    Culture   Final    CULTURE IN PROGRESS FOR FOUR WEEKS Performed at [facility]    Report Status PENDING  Incomplete  Tissue culture     Status: None (Preliminary result)   Collection Time: 11/14/14 12:28 PM  Result Value Ref Range Status   Specimen Description TISSUE PLEURAL  Final   Special Requests POF VANCY ZOSYN PART C  Final   Gram Stain PENDING  Incomplete   Culture   Final    NO GROWTH 1 DAY Performed at [facility]    Report Status PENDING  Incomplete  AFB culture with smear     Status: None (Preliminary result)   Collection Time: 11/14/14 12:28 PM  Result Value Ref Range Status   Specimen Description TISSUE PLEURAL  Final   Special Requests POF VANCY ZOSYN PART C  Final   Acid Fast Smear   Final    NO ACID FAST BACILLI SEEN Performed at [facility]    Culture   Final    CULTURE WILL BE EXAMINED FOR 6 WEEKS BEFORE ISSUING A FINAL REPORT Performed at [facility]     Report Status PENDING  Incomplete  Anaerobic culture     Status: None (Preliminary result)   Collection Time: 11/14/14 12:46 PM  Result Value Ref Range Status   Specimen Description WOUND LEFT PLEURAL  Final   Special Requests POF VANCY ZOSYN PART D  Final   Gram Stain   Final    FEW WBC PRESENT,BOTH PMN AND MONONUCLEAR NO SQUAMOUS EPITHELIAL CELLS SEEN NO ORGANISMS SEEN Performed at [facility]    Culture   Final    NO ANAEROBES ISOLATED; CULTURE IN  PROGRESS FOR 5 DAYS Performed at [facility]    Report Status PENDING  Incomplete  Wound culture     Status: None (Preliminary result)   Collection Time: 11/14/14 12:46 PM  Result Value Ref Range Status   Specimen Description WOUND LEFT PLEURAL  Final   Special Requests POF VANCY ZOSYN PART D  Final   Gram Stain   Final    FEW WBC PRESENT,BOTH PMN AND MONONUCLEAR NO SQUAMOUS EPITHELIAL CELLS SEEN NO ORGANISMS SEEN Performed at [facility]    Culture   Final    NO GROWTH 1 DAY Performed at [facility]    Report Status PENDING  Incomplete  Fungus Culture with Smear     Status: None (Preliminary result)   Collection Time: 11/14/14 12:54 PM  Result Value Ref Range Status   Specimen Description TISSUE LEFT PLEURAL  Final   Special Requests POF VANCY ZOSYN PART E  Final   Fungal Smear   Final    NO YEAST OR FUNGAL ELEMENTS SEEN Performed at [facility]    Culture   Final    CULTURE IN PROGRESS FOR FOUR WEEKS Performed at [facility]    Report Status PENDING  Incomplete  Tissue culture     Status: None (Preliminary result)   Collection Time: 11/14/14 12:54 PM  Result Value Ref Range Status   Specimen Description TISSUE LEFT PLEURAL  Final   Special Requests POF VANCY ZOSYN PART E  Final   Gram Stain   Final    RARE WBC PRESENT, PREDOMINANTLY MONONUCLEAR NO ORGANISMS SEEN Performed at [facility]    Culture   Final    NO GROWTH 1 DAY Performed at [facility]    Report Status PENDING  Incomplete  AFB culture with smear     Status: None (Preliminary result)   Collection Time: 11/14/14 12:54 PM  Result Value Ref Range Status   Specimen Description TISSUE LEFT PLEURAL  Final   Special Requests POF VANCY ZOSYN PART E  Final   Acid Fast Smear   Final    NO ACID FAST BACILLI SEEN Performed at [facility]    Culture   Final    CULTURE WILL BE EXAMINED FOR 6 WEEKS BEFORE ISSUING A FINAL REPORT Performed at [facility]    Report Status PENDING  Incomplete    Radiology Reports Dg Chest 2 View  11/11/2014   CLINICAL DATA:  Left-sided chest pain and shortness of breath for 4 days  EXAM: CHEST  2 VIEW  COMPARISON:  June 21, 2013  FINDINGS: There is extensive consolidation throughout the left lower lobe. There is questionable infiltrate in the lingula as well. Right lung is clear. Heart is mildly enlarged with pulmonary vascularity within normal limits. No adenopathy.  IMPRESSION: Extensive left lower lobe airspace consolidation. There may also be consolidation in a portion of the lingula. Right lung is clear. No change in cardiac prominence.   Electronically Signed   By: Lowella Grip III M.D.   On: 11/11/2014 15:48   Ct Angio Chest Pe W/cm &/or Wo Cm  11/12/2014   CLINICAL DATA:  Chest pain and short of breath.  EXAM: CT ANGIOGRAPHY CHEST WITH CONTRAST  TECHNIQUE: Multidetector CT imaging of the chest was performed using the standard protocol during bolus administration of intravenous contrast. Multiplanar CT image reconstructions and MIPs were obtained to evaluate the vascular anatomy.  CONTRAST:  175mL OMNIPAQUE IOHEXOL 350 MG/ML  SOLN  COMPARISON:  Plain film 11/11/2014, CT abdomen 10/14/2013  FINDINGS: Mediastinum/Nodes: There is no filling defects within the proximal main pulmonary arteries or the interlobar pulmonary arteries. Evaluation of the lower lobe segmental pulmonary arteries is degraded by patient respiratory  motion.  No acute findings of the aorta great vessels. No pericardial fluid. Left ventricle appears mildly enlarged. No mediastinal lymphadenopathy.  Lungs/Pleura: There is large left pleural effusion which occupies 70% of the left hemi thorax volume. There is atelectasis of the left upper lobe and left lower lobe associated with these large pleural effusion. There is no clear obstructing mass lesion obstructing the bronchus. The lung parenchyma itself is difficult to evaluate. The right lung is clear without evidence of fusion. There is mild atelectasis the right lung base.  Upper abdomen: Limited view of the liver, kidneys, pancreas are unremarkable. Normal adrenal glands.  No aggressive osseous lesion  Musculoskeletal: No aggressive osseous lesion  Review of the MIP images confirms the above findings.  IMPRESSION: 1. No evidence of acute pulmonary embolism within the main pulmonary arteries or intralobar pulmonary arteries. The lower lobe pulmonary arteries are poorly evaluated due to patient respiratory motion. 2. Large left pleural effusion occupying 70% of the left hemi thorax volume. There is passive atelectasis of the left lower lobe and left upper lobe. No obstructing mass lesion is identified.   Electronically Signed   By: Suzy Bouchard M.D.   On: 11/12/2014 01:08   Dg Chest Port 1 View  11/16/2014   CLINICAL DATA:  Status post left VATS with decortication and drainage of left pleural fluid.  EXAM: PORTABLE CHEST - 1 VIEW  COMPARISON:  Yesterday.  FINDINGS: Three left chest tubes remain in place with no pneumothorax seen. Right jugular catheter tip in the superior vena cava. Stable enlarged cardiac silhouette and left basilar opacity. Unremarkable bones.  IMPRESSION: 1. Three left chest tubes without pneumothorax. 2. Stable cardiomegaly and left basilar atelectasis, pneumonia, pulmonary contusion or re-expansion edema.   Electronically Signed   By: Claudie Revering M.D.   On: 11/16/2014 08:37   Dg Chest  Port 1 View  11/15/2014   CLINICAL DATA:  Chest tubes.  Dyspnea.  EXAM: PORTABLE CHEST - 1 VIEW  COMPARISON:  11/14/2014  FINDINGS: There are 3 left chest tubes. There is a right jugular central line extending into the SVC. There is no pneumothorax. There is left base consolidation. The right lung is clear.  IMPRESSION: Chest tubes appear unchanged in position. No pneumothorax. Unchanged left base consolidation.   Electronically Signed   By: Andreas Newport M.D.   On: 11/15/2014 06:20   Dg Chest Port 1 View  11/14/2014   CLINICAL DATA:  Empyema status post left lung decortication  EXAM: PORTABLE CHEST - 1 VIEW  COMPARISON:  11/12/2014  FINDINGS: Postsurgical changes in the left hemithorax. Two left apical chest tubes. Left basilar drain.  Small left pleural effusion, improved.  No pneumothorax is seen.  Right lung is clear.  Cardiomegaly.  Right IJ venous catheter terminates cavoatrial junction.  IMPRESSION: Postsurgical changes in the left hemithorax.  Small left pleural effusion, improved.  No pneumothorax is seen.  Two left apical chest tubes and left basilar drain.   Electronically Signed   By: Julian Hy M.D.   On: 11/14/2014 15:50   Dg Chest Port 1 View  11/12/2014   CLINICAL DATA:  Post thoracentesis.  EXAM: PORTABLE CHEST - 1 VIEW  COMPARISON:  11/11/2014  FINDINGS: Large left pleural effusion  with left lower lobe atelectasis or infiltrate. No pneumothorax following left thoracentesis. No scratch head minimal right base opacity, likely atelectasis. Cardiomegaly. No acute bony abnormality.  IMPRESSION: Large left pleural effusion with left lower lobe atelectasis or infiltrate. No pneumothorax.   Electronically Signed   By: Rolm Baptise M.D.   On: 11/12/2014 12:27    CBC  Recent Labs Lab 11/12/14 0719 11/13/14 0343 11/13/14 1434 11/14/14 0426 11/15/14 0430 11/16/14 0430  WBC 18.3* 18.3* 17.9* 18.6* 23.6* 21.7*  HGB 12.4 12.1 12.3 12.4 11.4* 11.8*  HCT 38.5 37.1 38.0 37.9 34.5* 36.6   PLT 409* 377 391 474* 466* 508*  MCV 81.6 80.5 79.8 79.0 78.4 80.4  MCH 26.3 26.2 25.8* 25.8* 25.9* 25.9*  MCHC 32.2 32.6 32.4 32.7 33.0 32.2  RDW 14.6 14.6 14.3 14.5 14.7 14.8  LYMPHSABS 1.5  --   --   --   --   --   MONOABS 1.6*  --   --   --   --   --   EOSABS 0.0  --   --   --   --   --   BASOSABS 0.0  --   --   --   --   --     Chemistries   Recent Labs Lab 11/12/14 0719 11/13/14 0343 11/13/14 1434 11/14/14 0426 11/15/14 0430 11/16/14 0430  NA 136 136 135 136 134* 134*  K 3.5 3.5 2.9* 3.6 3.1* 3.2*  CL 97* 99* 96* 95* 98* 96*  CO2 25 27 31 31 28 29   GLUCOSE 77 99 126* 123* 180* 122*  BUN 9 11 14 12 11 8   CREATININE 0.88 0.76 0.85 0.90 0.70 0.74  CALCIUM 8.8* 8.9 8.9 9.0 8.3* 8.8*  AST 14*  --  14*  --   --  14*  ALT 10*  --  9*  --   --  10*  ALKPHOS 82  --  86  --   --  68  BILITOT 0.8  --  0.7  --   --  0.7   ------------------------------------------------------------------------------------------------------------------ estimated creatinine clearance is 106.5 mL/min (by C-G formula based on Cr of 0.74). ------------------------------------------------------------------------------------------------------------------ No results for input(s): HGBA1C in the last 72 hours. ------------------------------------------------------------------------------------------------------------------ No results for input(s): CHOL, HDL, LDLCALC, TRIG, CHOLHDL, LDLDIRECT in the last 72 hours. ------------------------------------------------------------------------------------------------------------------ No results for input(s): TSH, T4TOTAL, T3FREE, THYROIDAB in the last 72 hours.  Invalid input(s): FREET3 ------------------------------------------------------------------------------------------------------------------ No results for input(s): VITAMINB12, FOLATE, FERRITIN, TIBC, IRON, RETICCTPCT in the last 72 hours.  Coagulation profile  Recent Labs Lab 11/13/14 1434  INR  1.21    No results for input(s): DDIMER in the last 72 hours.  Cardiac Enzymes  Recent Labs Lab 11/12/14 0719  TROPONINI 0.04*   ------------------------------------------------------------------------------------------------------------------ Invalid input(s): POCBNP  No results for input(s): GLUCAP in the last 72 hours.   RAI,RIPUDEEP M.D. [facility] 11/16/2014, 10:46 AM  Pager: [phone]   Between 7am to 7pm - call Pager - [phone]  After 7pm go to [email or web address] - password [email or web address]  Call night coverage person covering after 7pm

## 2014-11-16 NOTE — Progress Notes (Signed)
Urine sample at 1900 was contaminated with fecal matter.  Restarting 24-hour urine collection at this time.

## 2014-11-17 ENCOUNTER — Inpatient Hospital Stay (HOSPITAL_COMMUNITY): Payer: Medicaid Other

## 2014-11-17 ENCOUNTER — Encounter (HOSPITAL_COMMUNITY): Payer: Self-pay | Admitting: Thoracic Surgery (Cardiothoracic Vascular Surgery)

## 2014-11-17 DIAGNOSIS — J869 Pyothorax without fistula: Secondary | ICD-10-CM

## 2014-11-17 LAB — CBC
HEMATOCRIT: 37.3 % (ref 36.0–46.0)
Hemoglobin: 12.2 g/dL (ref 12.0–15.0)
MCH: 26 pg (ref 26.0–34.0)
MCHC: 32.7 g/dL (ref 30.0–36.0)
MCV: 79.4 fL (ref 78.0–100.0)
PLATELETS: 527 10*3/uL — AB (ref 150–400)
RBC: 4.7 MIL/uL (ref 3.87–5.11)
RDW: 14.6 % (ref 11.5–15.5)
WBC: 15.9 10*3/uL — ABNORMAL HIGH (ref 4.0–10.5)

## 2014-11-17 LAB — WOUND CULTURE: CULTURE: NO GROWTH

## 2014-11-17 LAB — BASIC METABOLIC PANEL
ANION GAP: 9 (ref 5–15)
BUN: 8 mg/dL (ref 6–20)
CO2: 30 mmol/L (ref 22–32)
Calcium: 9 mg/dL (ref 8.9–10.3)
Chloride: 96 mmol/L — ABNORMAL LOW (ref 101–111)
Creatinine, Ser: 0.88 mg/dL (ref 0.44–1.00)
GFR calc Af Amer: >60 mL/min (ref >60)
Glucose, Bld: 114 mg/dL — ABNORMAL HIGH (ref 70–99)
Potassium: 4.1 mmol/L (ref 3.5–5.1)
SODIUM: 135 mmol/L (ref 135–145)

## 2014-11-17 MED ORDER — LABETALOL HCL 200 MG PO TABS
200.0000 mg | ORAL_TABLET | Freq: Two times a day (BID) | ORAL | Status: DC
  Administered 2014-11-17 – 2014-11-20 (×7): 200 mg via ORAL
  Filled 2014-11-17 (×8): qty 1

## 2014-11-17 MED ORDER — ENSURE ENLIVE PO LIQD
237.0000 mL | Freq: Two times a day (BID) | ORAL | Status: DC
  Administered 2014-11-18 – 2014-11-20 (×3): 237 mL via ORAL

## 2014-11-17 MED ORDER — ENOXAPARIN SODIUM 40 MG/0.4ML ~~LOC~~ SOLN
40.0000 mg | SUBCUTANEOUS | Status: DC
  Administered 2014-11-17 – 2014-11-22 (×5): 40 mg via SUBCUTANEOUS
  Filled 2014-11-17 (×6): qty 0.4

## 2014-11-17 MED ORDER — NICARDIPINE HCL IN NACL 20-0.86 MG/200ML-% IV SOLN
3.0000 mg/h | INTRAVENOUS | Status: DC
  Administered 2014-11-17: 6 mg/h via INTRAVENOUS
  Administered 2014-11-17: 5 mg/h via INTRAVENOUS
  Administered 2014-11-17: 4 mg/h via INTRAVENOUS
  Administered 2014-11-17 (×2): 3 mg/h via INTRAVENOUS
  Filled 2014-11-17 (×5): qty 200

## 2014-11-17 MED ORDER — ALBUTEROL SULFATE (2.5 MG/3ML) 0.083% IN NEBU: 2.5000 mg | INHALATION_SOLUTION | Freq: Four times a day (QID) | RESPIRATORY_TRACT | Status: DC | PRN

## 2014-11-17 MED ORDER — HYDRALAZINE HCL 25 MG PO TABS
25.0000 mg | ORAL_TABLET | Freq: Three times a day (TID) | ORAL | Status: DC
  Administered 2014-11-17 – 2014-11-18 (×2): 25 mg via ORAL
  Filled 2014-11-17 (×5): qty 1

## 2014-11-17 MED ORDER — BOOST PLUS PO LIQD
237.0000 mL | Freq: Three times a day (TID) | ORAL | Status: DC
  Administered 2014-11-17: 237 mL via ORAL
  Filled 2014-11-17 (×6): qty 237

## 2014-11-17 NOTE — Progress Notes (Signed)
Dr. Tana Coast notified of one missed urine during 24 hour urine collection.  OK to continue collecting urine and to have missed one.

## 2014-11-17 NOTE — Progress Notes (Addendum)
Initial Nutrition Assessment  DOCUMENTATION CODES:  Obesity unspecified  INTERVENTION:  Ensure Enlive (each supplement provides 350kcal and 20 grams of protein) BID   NUTRITION DIAGNOSIS:  Inadequate oral intake related to other (see comment) (poor appetite) as evidenced by meal completion < 25%  GOAL:  Patient will meet greater than or equal to 90% of their needs  MONITOR:  PO intake, Supplement acceptance, Labs, Weight trends, I & O's  REASON FOR ASSESSMENT:  Consult Poor PO  ASSESSMENT: 44 yo Female with PMH of CAD, CHF. She has had a cough and general malaise for about 2 weeks. She developed progressive shortness of breath and presented to the ED.  She had a CXR and chest CT which showed a large left pleural effusion.   Patient s/p procedures LEFT VIDEO-ASSISTED THORACOSCOPY DRAINAGE OF EMPYEMA VISCERAL PLEURAL DECORTICATION  RD unable to obtain nutrition hx.  Pt sleeping.  Unable to wake.  PO intake very poor at 0-5% per flowsheet records.  Nutrient needs increased given post-op state.  Would benefit from addition of oral nutrition supplements.  RD to order.  Height:  Ht Readings from Last 1 Encounters:  11/14/14 5\' 8"  (1.727 m)    Weight:  Wt Readings from Last 1 Encounters:  11/15/14 207 lb 7.3 oz (94.1 kg)    Ideal Body Weight:  63.6 kg  Wt Readings from Last 10 Encounters:  11/15/14 207 lb 7.3 oz (94.1 kg)  04/03/14 198 lb (89.812 kg)  10/14/13 199 lb (90.266 kg)  06/21/13 199 lb (90.266 kg)    BMI:  Body mass index is 31.55 kg/(m^2).  Estimated Nutritional Needs:  Kcal:  1800-2000  Protein:  95-105 gm  Fluid:  1.8-2.0 L  Skin:  Reviewed, no issues  Diet Order:  Diet Heart Room service appropriate?: Yes; Fluid consistency:: Thin  EDUCATION NEEDS:  No education needs identified at this time   Intake/Output Summary (Last 24 hours) at 11/17/14 1554 Last data filed at 11/17/14 1400  Gross per 24 hour  Intake    630 ml  Output    3426 ml  Net  -2796 ml    Last BM:  5/9  Arthur Holms, RD, LDN Pager #: [phone] After-Hours Pager #: [phone]

## 2014-11-17 NOTE — Progress Notes (Signed)
[facility]                                                                              Patient Demographics  Emily Key, is a 44 y.o. female, DOB - 05/20/1971, FVC:[id number]  Admit date - 11/11/2014   Admitting Physician Rise Patience, MD  Outpatient Primary MD for the patient is No PCP Per Patient  LOS - 6   Chief Complaint  Patient presents with  . Chest Pain       Brief HPI   Patient is a 44 year old female with hypertension, had not been on antihypertensives due to financial reasons presented to ED with left-sided pleuritic chest pain. Patient reported that her symptoms started with nonproductive cough 5 days prior to admission and in the last 3 days she was having left-sided chest pain on deep inspiration. She also reported some diaphoresis. Chest x-ray showed left-sided pneumonia and patient was admitted for further workup.   Assessment & Plan    Principal Problem:   CAP (community acquired pneumonia)/extensive left-sided consolidation with loculated pleural effusion, abscess s/p VATS 5/6 - CT of the chest showed no pulmonary embolism, however large left pleural effusion occupying 70% of the left hemithorax with passive atelectasis but no obstructive mass lesion is identified. Pulmonary consulted, patient underwent thoracentesis, only 300 mL of cloudy yellow pleural fluid was obtained and studies sent, consistent with empyema, WBCs 1493, 75% neutrophils.   - CT surgery consulted, underwent VATS on 5/6 - Continue IV vancomycin and Zosyn - Management per cardiothoracic surgery   Active Problems: Leukocytosis: Likely due to #1 - Afebrile, continue vancomycin and Zosyn   Uncontrolled hypertension: Patient had not been taking any medications due to financial reasons, was on 3 antihypertensives, last taken in September of last year - Continue lisinopril 40 mg daily, HCTZ 25mg , clonidine 0.3mg , lasix 40mg  x1 5/8. I have discontinued her metoprolol and  placed on equalvalent dose of labetalol 200 mg twice a day, will adjust antihypertensives, may need to add Norvasc or hydralazine. - check an echo to assess if there is LVH - TSH normal, follow, renin/aldosterone ratio, catecholamines, UA negative for proteinuria - renal ultrasound Doppler to rule out renal artery stenosis or renovascular hypertension from fibromuscular dysplasia - Placed on Cardene drip today to goal SBP less than 638 and diastolic less than 466     Pleuritic chest pain -Continue pain medication  Code Status:Full code  Family Communication: Discussed in detail with the patient, all imaging results, lab results explained to the patient    Disposition Plan: Not medically ready   Time Spent in minutes   25 minutes  Procedures  Thoracentesis CT chest   Consults   Pulmonology CT surgery   DVT Prophylaxis   Lovenox   Medications  Scheduled Meds: . acetaminophen  1,000 mg Oral 4 times per day  . albuterol  2.5 mg Nebulization BID  . bisacodyl  10 mg Oral Daily  . cloNIDine  0.3 mg Transdermal Weekly  . enoxaparin (LOVENOX) injection  40 mg Subcutaneous Q24H  . hydrochlorothiazide  25 mg Oral Daily  . labetalol  200 mg Oral BID  .  lactose free nutrition  237 mL Oral TID WC  . lisinopril  40 mg Oral Daily  . piperacillin-tazobactam (ZOSYN)  IV  3.375 g Intravenous Q8H  . polyethylene glycol  17 g Oral Daily  . potassium chloride  40 mEq Oral BID  . senna-docusate  1 tablet Oral QHS  . sodium chloride  10-40 mL Intracatheter Q12H  . sodium chloride  3 mL Intravenous Q12H  . vancomycin  1,000 mg Intravenous Q8H   Continuous Infusions: . niCARDipine 3 mg/hr (11/17/14 0852)   PRN Meds:.hydrALAZINE, labetalol, ondansetron (ZOFRAN) IV, oxyCODONE, potassium chloride, sodium chloride, sodium chloride, traMADol   Antibiotics   Anti-infectives    Start     Dose/Rate Route Frequency Ordered Stop   11/14/14 1100  vancomycin (VANCOCIN) IVPB 1000 mg/200 mL premix   Status:  Discontinued     1,000 mg 200 mL/hr over 60 Minutes Intravenous To [facility] 11/13/14 1235 11/14/14 1708   11/13/14 1600  piperacillin-tazobactam (ZOSYN) IVPB 3.375 g     3.375 g 12.5 mL/hr over 240 Minutes Intravenous Every 8 hours 11/13/14 1518     11/13/14 1600  vancomycin (VANCOCIN) IVPB 1000 mg/200 mL premix     1,000 mg 200 mL/hr over 60 Minutes Intravenous Every 8 hours 11/13/14 1518     11/12/14 2200  azithromycin (ZITHROMAX) tablet 500 mg  Status:  Discontinued     500 mg Oral Daily at bedtime 11/12/14 1045 11/13/14 1453   11/12/14 2000  cefTRIAXone (ROCEPHIN) 1 g in dextrose 5 % 50 mL IVPB - Premix  Status:  Discontinued     1 g 100 mL/hr over 30 Minutes Intravenous Every 24 hours 11/11/14 2142 11/13/14 1453   11/11/14 2200  azithromycin (ZITHROMAX) 500 mg in dextrose 5 % 250 mL IVPB  Status:  Discontinued     500 mg 250 mL/hr over 60 Minutes Intravenous Every 24 hours 11/11/14 2142 11/12/14 1045   11/11/14 1930  azithromycin (ZITHROMAX) 500 mg in dextrose 5 % 250 mL IVPB  Status:  Discontinued     500 mg 250 mL/hr over 60 Minutes Intravenous  Once 11/11/14 1925 11/11/14 2256   11/11/14 1930  cefTRIAXone (ROCEPHIN) 1 g in dextrose 5 % 50 mL IVPB     1 g 100 mL/hr over 30 Minutes Intravenous  Once 11/11/14 1925 11/11/14 2042        Subjective:   Emily Key was seen and examined today. Complaining of her chest pain, BP still significantly uncontrolled.  Afebrile, denies any abdominal pain, N/V/D/C, new weakness, numbess, tingling.   Objective:   Blood pressure 207/89, pulse 65, temperature 97.5 F (36.4 C), temperature source Oral, resp. rate 29, height 5\' 8"  (1.727 m), weight 94.1 kg (207 lb 7.3 oz), last menstrual period 11/11/2014, SpO2 97 %.  Wt Readings from Last 3 Encounters:  11/15/14 94.1 kg (207 lb 7.3 oz)  04/03/14 89.812 kg (198 lb)  10/14/13 90.266 kg (199 lb)     Intake/Output Summary (Last 24 hours) at 11/17/14 1041 Last data  filed at 11/17/14 0600  Gross per 24 hour  Intake   1150 ml  Output   4776 ml  Net  -3626 ml    Exam  General: Alert and oriented x 3, NAD, uncomfortable due to pain  HEENT:  PERRLA, EOMI, Anicteic Sclera,  Neck: Supple, no JVD, no masses  CVS: S1 S2clear, RRR  Respiratory: Decreased breath sounds at the bases. Chest tube x2+,    men: Soft, NT, ND, NBS  Ext: no cyanosis clubbing or edema  Neuro: no new focal neurological deficits  Skin: No rashes  Psych: Normal affect and demeanor   Data Review   Micro Results Recent Results (from the past 240 hour(s))  Blood culture (routine x 2)     Status: None (Preliminary result)   Collection Time: 11/11/14  7:58 PM  Result Value Ref Range Status   Specimen Description BLOOD RIGHT WRIST  Final   Special Requests BOTTLES DRAWN AEROBIC AND ANAEROBIC 5CC   Final   Culture   Final           BLOOD CULTURE RECEIVED NO GROWTH TO DATE CULTURE WILL BE HELD FOR 5 DAYS BEFORE ISSUING A FINAL NEGATIVE REPORT Performed at [facility]    Report Status PENDING  Incomplete  Blood culture (routine x 2)     Status: None (Preliminary result)   Collection Time: 11/11/14  8:01 PM  Result Value Ref Range Status   Specimen Description BLOOD LEFT WRIST  Final   Special Requests BOTTLES DRAWN AEROBIC AND ANAEROBIC 4CC  Final   Culture   Final           BLOOD CULTURE RECEIVED NO GROWTH TO DATE CULTURE WILL BE HELD FOR 5 DAYS BEFORE ISSUING A FINAL NEGATIVE REPORT Performed at [facility]    Report Status PENDING  Incomplete  AFB culture with smear     Status: None (Preliminary result)   Collection Time: 11/12/14 11:44 AM  Result Value Ref Range Status   Specimen Description FLUID LEFT PLEURAL  Final   Special Requests Normal  Final   Acid Fast Smear   Final    NO ACID FAST BACILLI SEEN Performed at [facility]    Culture   Final    CULTURE WILL BE EXAMINED FOR 6 WEEKS BEFORE ISSUING A FINAL REPORT Performed at  [facility]    Report Status PENDING  Incomplete  Body fluid culture     Status: None   Collection Time: 11/12/14 11:44 AM  Result Value Ref Range Status   Specimen Description FLUID LEFT PLEURAL  Final   Special Requests Normal  Final   Gram Stain   Final    RARE WBC PRESENT, PREDOMINANTLY PMN NO ORGANISMS SEEN Performed at [facility]    Culture   Final    NO GROWTH 3 DAYS Performed at [facility]    Report Status 11/16/2014 FINAL  Final  Surgical pcr screen     Status: None   Collection Time: 11/14/14  5:37 AM  Result Value Ref Range Status   MRSA, PCR NEGATIVE NEGATIVE Final   Staphylococcus aureus NEGATIVE NEGATIVE Final    Comment:        The Xpert SA Assay (FDA approved for NASAL specimens in patients over 46 years of age), is one component of a comprehensive surveillance program.  Test performance has been validated by [facility] for patients greater than or equal to 64 year old. It is not intended to diagnose infection nor to guide or monitor treatment.   Fungus Culture with Smear     Status: None (Preliminary result)   Collection Time: 11/14/14 12:25 PM  Result Value Ref Range Status   Specimen Description FLUID PLEURAL  Final   Special Requests POF VANCY ZOSYN PART B  Final   Fungal Smear   Final    NO YEAST OR FUNGAL ELEMENTS SEEN Performed at [facility]  Final    CULTURE IN PROGRESS FOR FOUR WEEKS Performed at [facility]    Report Status PENDING  Incomplete  Body fluid culture     Status: None (Preliminary result)   Collection Time: 11/14/14 12:25 PM  Result Value Ref Range Status   Specimen Description FLUID PLEURAL  Final   Special Requests POF VANCY ZOSYN PART B  Final   Gram Stain   Final    NO WBC SEEN NO ORGANISMS SEEN Performed at [facility]    Culture   Final    NO GROWTH 1 DAY Performed at [facility]    Report Status PENDING  Incomplete  AFB culture  with smear     Status: None (Preliminary result)   Collection Time: 11/14/14 12:25 PM  Result Value Ref Range Status   Specimen Description FLUID PLEURAL  Final   Special Requests POF VANCY ZOSYN PART B  Final   Acid Fast Smear   Final    NO ACID FAST BACILLI SEEN Performed at [facility]    Culture   Final    CULTURE WILL BE EXAMINED FOR 6 WEEKS BEFORE ISSUING A FINAL REPORT Performed at [facility]    Report Status PENDING  Incomplete  Fungus Culture with Smear     Status: None (Preliminary result)   Collection Time: 11/14/14 12:28 PM  Result Value Ref Range Status   Specimen Description TISSUE PLEURAL  Final   Special Requests POF VANCY ZOSYN PART C  Final   Fungal Smear   Final    NO YEAST OR FUNGAL ELEMENTS SEEN Performed at [facility]    Culture   Final    CULTURE IN PROGRESS FOR FOUR WEEKS Performed at [facility]    Report Status PENDING  Incomplete  Tissue culture     Status: None (Preliminary result)   Collection Time: 11/14/14 12:28 PM  Result Value Ref Range Status   Specimen Description TISSUE PLEURAL  Final   Special Requests POF VANCY ZOSYN PART C  Final   Gram Stain   Final    NO WBC SEEN NO SQUAMOUS EPITHELIAL CELLS SEEN NO ORGANISMS SEEN Performed at [facility]    Culture   Final    NO GROWTH 2 DAYS Performed at [facility]    Report Status PENDING  Incomplete  AFB culture with smear     Status: None (Preliminary result)   Collection Time: 11/14/14 12:28 PM  Result Value Ref Range Status   Specimen Description TISSUE PLEURAL  Final   Special Requests POF VANCY ZOSYN PART C  Final   Acid Fast Smear   Final    NO ACID FAST BACILLI SEEN Performed at [facility]    Culture   Final    CULTURE WILL BE EXAMINED FOR 6 WEEKS BEFORE ISSUING A FINAL REPORT Performed at [facility]    Report Status PENDING  Incomplete  Anaerobic culture     Status: None (Preliminary result)    Collection Time: 11/14/14 12:46 PM  Result Value Ref Range Status   Specimen Description WOUND LEFT PLEURAL  Final   Special Requests POF VANCY ZOSYN PART D  Final   Gram Stain   Final    FEW WBC PRESENT,BOTH PMN AND MONONUCLEAR NO SQUAMOUS EPITHELIAL CELLS SEEN NO ORGANISMS SEEN Performed at [facility]    Culture   Final    NO ANAEROBES ISOLATED; CULTURE IN PROGRESS FOR  5 DAYS Performed at [facility]    Report Status PENDING  Incomplete  Wound culture     Status: None   Collection Time: 11/14/14 12:46 PM  Result Value Ref Range Status   Specimen Description WOUND LEFT PLEURAL  Final   Special Requests POF VANCY ZOSYN PART D  Final   Gram Stain   Final    FEW WBC PRESENT,BOTH PMN AND MONONUCLEAR NO SQUAMOUS EPITHELIAL CELLS SEEN NO ORGANISMS SEEN Performed at [facility]    Culture   Final    NO GROWTH 2 DAYS Performed at [facility]    Report Status 11/17/2014 FINAL  Final  Fungus Culture with Smear     Status: None (Preliminary result)   Collection Time: 11/14/14 12:54 PM  Result Value Ref Range Status   Specimen Description TISSUE LEFT PLEURAL  Final   Special Requests POF VANCY ZOSYN PART E  Final   Fungal Smear   Final    NO YEAST OR FUNGAL ELEMENTS SEEN Performed at [facility]    Culture   Final    CULTURE IN PROGRESS FOR FOUR WEEKS Performed at [facility]    Report Status PENDING  Incomplete  Tissue culture     Status: None (Preliminary result)   Collection Time: 11/14/14 12:54 PM  Result Value Ref Range Status   Specimen Description TISSUE LEFT PLEURAL  Final   Special Requests POF VANCY ZOSYN PART E  Final   Gram Stain   Final    RARE WBC PRESENT, PREDOMINANTLY MONONUCLEAR NO ORGANISMS SEEN Performed at [facility]    Culture   Final    NO GROWTH 2 DAYS Performed at [facility]    Report Status PENDING  Incomplete  AFB culture with smear     Status: None (Preliminary  result)   Collection Time: 11/14/14 12:54 PM  Result Value Ref Range Status   Specimen Description TISSUE LEFT PLEURAL  Final   Special Requests POF VANCY ZOSYN PART E  Final   Acid Fast Smear   Final    NO ACID FAST BACILLI SEEN Performed at [facility]    Culture   Final    CULTURE WILL BE EXAMINED FOR 6 WEEKS BEFORE ISSUING A FINAL REPORT Performed at [facility]    Report Status PENDING  Incomplete    Radiology Reports Dg Chest 2 View  11/11/2014   CLINICAL DATA:  Left-sided chest pain and shortness of breath for 4 days  EXAM: CHEST  2 VIEW  COMPARISON:  June 21, 2013  FINDINGS: There is extensive consolidation throughout the left lower lobe. There is questionable infiltrate in the lingula as well. Right lung is clear. Heart is mildly enlarged with pulmonary vascularity within normal limits. No adenopathy.  IMPRESSION: Extensive left lower lobe airspace consolidation. There may also be consolidation in a portion of the lingula. Right lung is clear. No change in cardiac prominence.   Electronically Signed   By: Lowella Grip III M.D.   On: 11/11/2014 15:48   Ct Angio Chest Pe W/cm &/or Wo Cm  11/12/2014   CLINICAL DATA:  Chest pain and short of breath.  EXAM: CT ANGIOGRAPHY CHEST WITH CONTRAST  TECHNIQUE: Multidetector CT imaging of the chest was performed using the standard protocol during bolus administration of intravenous contrast. Multiplanar CT image reconstructions and MIPs were obtained to evaluate the vascular anatomy.  CONTRAST:  150mL OMNIPAQUE IOHEXOL 350 MG/ML SOLN  COMPARISON:  Plain film 11/11/2014, CT abdomen 10/14/2013  FINDINGS: Mediastinum/Nodes: There is no filling defects within the proximal main pulmonary arteries or the interlobar pulmonary arteries. Evaluation of the lower lobe segmental pulmonary arteries is degraded by patient respiratory motion.  No acute findings of the aorta great vessels. No pericardial fluid. Left ventricle appears mildly  enlarged. No mediastinal lymphadenopathy.  Lungs/Pleura: There is large left pleural effusion which occupies 70% of the left hemi thorax volume. There is atelectasis of the left upper lobe and left lower lobe associated with these large pleural effusion. There is no clear obstructing mass lesion obstructing the bronchus. The lung parenchyma itself is difficult to evaluate. The right lung is clear without evidence of fusion. There is mild atelectasis the right lung base.  Upper abdomen: Limited view of the liver, kidneys, pancreas are unremarkable. Normal adrenal glands.  No aggressive osseous lesion  Musculoskeletal: No aggressive osseous lesion  Review of the MIP images confirms the above findings.  IMPRESSION: 1. No evidence of acute pulmonary embolism within the main pulmonary arteries or intralobar pulmonary arteries. The lower lobe pulmonary arteries are poorly evaluated due to patient respiratory motion. 2. Large left pleural effusion occupying 70% of the left hemi thorax volume. There is passive atelectasis of the left lower lobe and left upper lobe. No obstructing mass lesion is identified.   Electronically Signed   By: Suzy Bouchard M.D.   On: 11/12/2014 01:08   Dg Chest Port 1 View  11/17/2014   CLINICAL DATA:  Vats.  EXAM: PORTABLE CHEST - 1 VIEW  COMPARISON:  11/16/2014.  11/15/2014.  FINDINGS: Interim removal of lateral most chest tube. Remaining 2 chest tubes in stable position. Right IJ line stable position. Stable cardiomegaly with normal pulmonary vascularity. Persistent left base atelectasis and/or infiltrate.  IMPRESSION: 1. Interim removal of lateral most left chest tube. Remaining 2 chest tubes in stable position. No pneumothorax. 2. Persistent atelectasis and/or infiltrate left lower lobe. 3. Stable cardiomegaly   Electronically Signed   By: Marcello Moores  Register   On: 11/17/2014 07:52   Dg Chest Port 1 View  11/16/2014   CLINICAL DATA:  Status post left VATS with decortication and drainage  of left pleural fluid.  EXAM: PORTABLE CHEST - 1 VIEW  COMPARISON:  Yesterday.  FINDINGS: Three left chest tubes remain in place with no pneumothorax seen. Right jugular catheter tip in the superior vena cava. Stable enlarged cardiac silhouette and left basilar opacity. Unremarkable bones.  IMPRESSION: 1. Three left chest tubes without pneumothorax. 2. Stable cardiomegaly and left basilar atelectasis, pneumonia, pulmonary contusion or re-expansion edema.   Electronically Signed   By: Claudie Revering M.D.   On: 11/16/2014 08:37   Dg Chest Port 1 View  11/15/2014   CLINICAL DATA:  Chest tubes.  Dyspnea.  EXAM: PORTABLE CHEST - 1 VIEW  COMPARISON:  11/14/2014  FINDINGS: There are 3 left chest tubes. There is a right jugular central line extending into the SVC. There is no pneumothorax. There is left base consolidation. The right lung is clear.  IMPRESSION: Chest tubes appear unchanged in position. No pneumothorax. Unchanged left base consolidation.   Electronically Signed   By: Andreas Newport M.D.   On: 11/15/2014 06:20   Dg Chest Port 1 View  11/14/2014   CLINICAL DATA:  Empyema status post left lung decortication  EXAM: PORTABLE CHEST - 1 VIEW  COMPARISON:  11/12/2014  FINDINGS: Postsurgical changes in the left hemithorax. Two left apical chest tubes. Left basilar drain.  Small left pleural effusion, improved.  No pneumothorax is seen.  Right lung is clear.  Cardiomegaly.  Right IJ venous catheter terminates cavoatrial junction.  IMPRESSION: Postsurgical changes in the left hemithorax.  Small left pleural effusion, improved.  No pneumothorax is seen.  Two left apical chest tubes and left basilar drain.   Electronically Signed   By: Julian Hy M.D.   On: 11/14/2014 15:50   Dg Chest Port 1 View  11/12/2014   CLINICAL DATA:  Post thoracentesis.  EXAM: PORTABLE CHEST - 1 VIEW  COMPARISON:  11/11/2014  FINDINGS: Large left pleural effusion with left lower lobe atelectasis or infiltrate. No pneumothorax  following left thoracentesis. No scratch head minimal right base opacity, likely atelectasis. Cardiomegaly. No acute bony abnormality.  IMPRESSION: Large left pleural effusion with left lower lobe atelectasis or infiltrate. No pneumothorax.   Electronically Signed   By: Rolm Baptise M.D.   On: 11/12/2014 12:27    CBC  Recent Labs Lab 11/12/14 0719  11/13/14 1434 11/14/14 0426 11/15/14 0430 11/16/14 0430 11/17/14 0515  WBC 18.3*  < > 17.9* 18.6* 23.6* 21.7* 15.9*  HGB 12.4  < > 12.3 12.4 11.4* 11.8* 12.2  HCT 38.5  < > 38.0 37.9 34.5* 36.6 37.3  PLT 409*  < > 391 474* 466* 508* 527*  MCV 81.6  < > 79.8 79.0 78.4 80.4 79.4  MCH 26.3  < > 25.8* 25.8* 25.9* 25.9* 26.0  MCHC 32.2  < > 32.4 32.7 33.0 32.2 32.7  RDW 14.6  < > 14.3 14.5 14.7 14.8 14.6  LYMPHSABS 1.5  --   --   --   --   --   --   MONOABS 1.6*  --   --   --   --   --   --   EOSABS 0.0  --   --   --   --   --   --   BASOSABS 0.0  --   --   --   --   --   --   < > = values in this interval not displayed.  Chemistries   Recent Labs Lab 11/12/14 0719  11/13/14 1434 11/14/14 0426 11/15/14 0430 11/16/14 0430 11/17/14 0515  NA 136  < > 135 136 134* 134* 135  K 3.5  < > 2.9* 3.6 3.1* 3.2* 4.1  CL 97*  < > 96* 95* 98* 96* 96*  CO2 25  < > 31 31 28 29 30   GLUCOSE 77  < > 126* 123* 180* 122* 114*  BUN 9  < > 14 12 11 8 8   CREATININE 0.88  < > 0.85 0.90 0.70 0.74 0.88  CALCIUM 8.8*  < > 8.9 9.0 8.3* 8.8* 9.0  AST 14*  --  14*  --   --  14*  --   ALT 10*  --  9*  --   --  10*  --   ALKPHOS 82  --  86  --   --  68  --   BILITOT 0.8  --  0.7  --   --  0.7  --   < > = values in this interval not displayed. ------------------------------------------------------------------------------------------------------------------ estimated creatinine clearance is 96.9 mL/min (by C-G formula based on Cr of 0.88). ------------------------------------------------------------------------------------------------------------------ No  results for input(s): HGBA1C in the last 72 hours. ------------------------------------------------------------------------------------------------------------------ No results for input(s): CHOL, HDL, LDLCALC, TRIG, CHOLHDL, LDLDIRECT in the last 72 hours. ------------------------------------------------------------------------------------------------------------------  Recent Labs  11/16/14 1343  TSH 1.485   ------------------------------------------------------------------------------------------------------------------ No results for input(s): VITAMINB12, FOLATE, FERRITIN, TIBC, IRON, RETICCTPCT in the last 72 hours.  Coagulation profile  Recent Labs Lab 11/13/14 1434  INR 1.21    No results for input(s): DDIMER in the last 72 hours.  Cardiac Enzymes  Recent Labs Lab 11/12/14 0719  TROPONINI 0.04*   ------------------------------------------------------------------------------------------------------------------ Invalid input(s): POCBNP  No results for input(s): GLUCAP in the last 72 hours.   Davaris Youtsey M.D. [facility] 11/17/2014, 10:41 AM  Pager: [phone]   Between 7am to 7pm - call Pager - [phone]  After 7pm go to [email or web address] - password [email or web address]  Call night coverage person covering after 7pm

## 2014-11-17 NOTE — Progress Notes (Signed)
3 Days Post-Op Procedure(s) (LRB): LEFT VIDEO ASSISTED THORACOSCOPY (VATS)/DECORTICATION (Left) DRAINAGE OF LEFT PLEURAL EFFUSION (Left) Subjective: C/o poor appetite, smell of food causes nausea  Objective: Vital signs in last 24 hours: Temp:  [97.4 F (36.3 C)-98.1 F (36.7 C)] 97.6 F (36.4 C) (05/09 0400) Pulse Rate:  [50-88] 65 (05/09 0700) Cardiac Rhythm:  [-] Normal sinus rhythm (05/09 0400) Resp:  [9-35] 29 (05/09 0700) BP: (146-233)/(59-153) 207/89 mmHg (05/09 0700) SpO2:  [93 %-100 %] 96 % (05/09 0700)  Hemodynamic parameters for last 24 hours:    Intake/Output from previous day: 05/08 0701 - 05/09 0700 In: 1370 [P.O.:360; IV Piggyback:1000] Out: 5056 [Urine:4775; Emesis/NG output:250; Stool:1; Chest Tube:30] Intake/Output this shift:    General appearance: alert, cooperative and no distress Neurologic: intact Heart: regular rate and rhythm Lungs: improved BS left base Abdomen: normal findings: soft, non-tender no air leak, serosanguinous drainage  Lab Results:  Recent Labs  11/16/14 0430 11/17/14 0515  WBC 21.7* 15.9*  HGB 11.8* 12.2  HCT 36.6 37.3  PLT 508* 527*   BMET:  Recent Labs  11/16/14 0430 11/17/14 0515  NA 134* 135  K 3.2* 4.1  CL 96* 96*  CO2 29 30  GLUCOSE 122* 114*  BUN 8 8  CREATININE 0.74 0.88  CALCIUM 8.8* 9.0    PT/INR: No results for input(s): LABPROT, INR in the last 72 hours. ABG    Component Value Date/Time   PHART 7.395 11/15/2014 0428   HCO3 28.5* 11/15/2014 0428   TCO2 30 11/15/2014 0428   O2SAT 97.0 11/15/2014 0428   CBG (last 3)  No results for input(s): GLUCAP in the last 72 hours.  Assessment/Plan: S/P Procedure(s) (LRB): LEFT VIDEO ASSISTED THORACOSCOPY (VATS)/DECORTICATION (Left) DRAINAGE OF LEFT PLEURAL EFFUSION (Left) -  CV- hypertension remains a significant issue despite efforts to control it.  D/w internal med- they will start a Cardene drip and then adjust medications  RESP- POD # 3  decortication  Cultures negative to date  Her pleural fluid was not grossly purulent but she did have an abscess in the lung  Would continue vanco and zosyn for a week and then consider longer term PO antibiotics  DC posterior CT, leave diaphragmatic tube one more day  RENAL- creatinine OK, hypokalemia improved  Diuresed well yesterday  SCD for DVT prophylaxis, I think she is far enough out from surgery to make enoxaparin safe at this point   LOS: 6 days    Emily Key 11/17/2014

## 2014-11-18 ENCOUNTER — Inpatient Hospital Stay (HOSPITAL_COMMUNITY): Payer: Medicaid Other

## 2014-11-18 LAB — BODY FLUID CULTURE
Culture: NO GROWTH
Gram Stain: NONE SEEN

## 2014-11-18 LAB — TISSUE CULTURE
Culture: NO GROWTH
Culture: NO GROWTH
Gram Stain: NONE SEEN

## 2014-11-18 LAB — BASIC METABOLIC PANEL
Anion gap: 9 (ref 5–15)
BUN: 9 mg/dL (ref 6–20)
CHLORIDE: 96 mmol/L — AB (ref 101–111)
CO2: 29 mmol/L (ref 22–32)
Calcium: 8.8 mg/dL — ABNORMAL LOW (ref 8.9–10.3)
Creatinine, Ser: 0.98 mg/dL (ref 0.44–1.00)
GFR calc non Af Amer: >60 mL/min (ref >60)
Glucose, Bld: 118 mg/dL — ABNORMAL HIGH (ref 70–99)
Potassium: 3.6 mmol/L (ref 3.5–5.1)
SODIUM: 134 mmol/L — AB (ref 135–145)

## 2014-11-18 LAB — CBC
HCT: 35.5 % — ABNORMAL LOW (ref 36.0–46.0)
HEMOGLOBIN: 11.4 g/dL — AB (ref 12.0–15.0)
MCH: 25.4 pg — ABNORMAL LOW (ref 26.0–34.0)
MCHC: 32.1 g/dL (ref 30.0–36.0)
MCV: 79.2 fL (ref 78.0–100.0)
Platelets: 510 10*3/uL — ABNORMAL HIGH (ref 150–400)
RBC: 4.48 MIL/uL (ref 3.87–5.11)
RDW: 14.8 % (ref 11.5–15.5)
WBC: 14 10*3/uL — ABNORMAL HIGH (ref 4.0–10.5)

## 2014-11-18 LAB — CULTURE, BLOOD (ROUTINE X 2)
CULTURE: NO GROWTH
Culture: NO GROWTH

## 2014-11-18 LAB — ALDOSTERONE + RENIN ACTIVITY W/ RATIO
ALDO / PRA Ratio: UNDETERMINED
PRA LC/MS/MS: <0.15 ng/mL/hr

## 2014-11-18 LAB — MISCELLANEOUS TEST

## 2014-11-18 MED ORDER — HYDRALAZINE HCL 50 MG PO TABS
50.0000 mg | ORAL_TABLET | Freq: Three times a day (TID) | ORAL | Status: DC
  Administered 2014-11-18 – 2014-11-20 (×6): 50 mg via ORAL
  Filled 2014-11-18 (×6): qty 1

## 2014-11-18 NOTE — Progress Notes (Signed)
[facility]                                                                              Patient Demographics  Emily Key, is a 44 y.o. female, DOB - 1970-08-01, IRC:[id number]  Admit date - 11/11/2014   Admitting Physician Rise Patience, MD  Outpatient Primary MD for the patient is No PCP Per Patient  LOS - 7   Chief Complaint  Patient presents with  . Chest Pain       Brief HPI   Patient is a 44 year old female with hypertension, had not been on antihypertensives due to financial reasons presented to ED with left-sided pleuritic chest pain. Patient reported that her symptoms started with nonproductive cough 5 days prior to admission and in the last 3 days she was having left-sided chest pain on deep inspiration. She also reported some diaphoresis. Chest x-ray showed left-sided pneumonia and patient was admitted for further workup. CT of the chest showed large pleural effusion with extensive consolidation. Pulmonary was consulted but unable to do therapeutic thoracentesis due to loculated pleural effusion. CT surgery performed VATS on 5/6. Patient has been stable from CT surgery standpoint, having issues with uncontrolled hypertension. She was placed on Cardene drip on 5/9, now weaned off   Assessment & Plan    Principal Problem:   CAP (community acquired pneumonia)/extensive left-sided consolidation with loculated pleural effusion, abscess s/p VATS 5/6 - CT of the chest showed no pulmonary embolism, however large left pleural effusion occupying 70% of the left hemithorax with passive atelectasis but no obstructive mass lesion is identified. Pulmonary consulted, patient underwent thoracentesis, only 300 mL of cloudy yellow pleural fluid was obtained and studies sent, consistent with empyema, WBCs 1493, 75% neutrophils.   - CT surgery consulted, underwent VATS on 5/6 - Continue IV vancomycin and Zosyn - Management per cardiothoracic surgery   Active  Problems: Leukocytosis: Likely due to #1 - Afebrile, continue vancomycin and Zosyn   Uncontrolled hypertension: Patient had not been taking any medications due to financial reasons, was on 3 antihypertensives, last taken in September of last year: Improving, off Cardene drip - Continue lisinopril 40 mg daily, HCTZ 25mg , clonidine 0.3mg , lasix 40mg  x1 5/8. Placed on labetalol yesterday, she did not have good BP control with max dose of metoprolol. Added hydralazine yesterday, increase to 50 mg q8hrs today.  - Follow echo results, renal duplex to rule out renal artery stenosis or renovascular hypertension from fibromuscular dysplasia - TSH normal, renin aldosterone ratio normal, metanephrines pending, UA negative for proteinuria - Pain also driving her BP high     Pleuritic chest pain -Continue pain medication  Code Status:Full code  Family Communication: Discussed in detail with the patient, all imaging results, lab results explained to the patient    Disposition Plan: Not medically ready , should be okay to transfer to stepdown unit if beds available  Time Spent in minutes   25 minutes  Procedures  Thoracentesis CT chest   Consults   Pulmonology CT surgery   DVT Prophylaxis   Lovenox   Medications  Scheduled Meds: . acetaminophen  1,000 mg Oral 4 times  per day  . bisacodyl  10 mg Oral Daily  . cloNIDine  0.3 mg Transdermal Weekly  . enoxaparin (LOVENOX) injection  40 mg Subcutaneous Q24H  . feeding supplement (ENSURE ENLIVE)  237 mL Oral BID BM  . hydrALAZINE  50 mg Oral 3 times per day  . hydrochlorothiazide  25 mg Oral Daily  . labetalol  200 mg Oral BID  . lisinopril  40 mg Oral Daily  . piperacillin-tazobactam (ZOSYN)  IV  3.375 g Intravenous Q8H  . polyethylene glycol  17 g Oral Daily  . senna-docusate  1 tablet Oral QHS  . sodium chloride  10-40 mL Intracatheter Q12H  . sodium chloride  3 mL Intravenous Q12H  . vancomycin  1,000 mg Intravenous Q8H    Continuous Infusions: . niCARDipine Stopped (11/18/14 0502)   PRN Meds:.albuterol, hydrALAZINE, labetalol, ondansetron (ZOFRAN) IV, oxyCODONE, potassium chloride, sodium chloride, sodium chloride, traMADol   Antibiotics   Anti-infectives    Start     Dose/Rate Route Frequency Ordered Stop   11/14/14 1100  vancomycin (VANCOCIN) IVPB 1000 mg/200 mL premix  Status:  Discontinued     1,000 mg 200 mL/hr over 60 Minutes Intravenous To [facility] 11/13/14 1235 11/14/14 1708   11/13/14 1600  piperacillin-tazobactam (ZOSYN) IVPB 3.375 g     3.375 g 12.5 mL/hr over 240 Minutes Intravenous Every 8 hours 11/13/14 1518     11/13/14 1600  vancomycin (VANCOCIN) IVPB 1000 mg/200 mL premix     1,000 mg 200 mL/hr over 60 Minutes Intravenous Every 8 hours 11/13/14 1518     11/12/14 2200  azithromycin (ZITHROMAX) tablet 500 mg  Status:  Discontinued     500 mg Oral Daily at bedtime 11/12/14 1045 11/13/14 1453   11/12/14 2000  cefTRIAXone (ROCEPHIN) 1 g in dextrose 5 % 50 mL IVPB - Premix  Status:  Discontinued     1 g 100 mL/hr over 30 Minutes Intravenous Every 24 hours 11/11/14 2142 11/13/14 1453   11/11/14 2200  azithromycin (ZITHROMAX) 500 mg in dextrose 5 % 250 mL IVPB  Status:  Discontinued     500 mg 250 mL/hr over 60 Minutes Intravenous Every 24 hours 11/11/14 2142 11/12/14 1045   11/11/14 1930  azithromycin (ZITHROMAX) 500 mg in dextrose 5 % 250 mL IVPB  Status:  Discontinued     500 mg 250 mL/hr over 60 Minutes Intravenous  Once 11/11/14 1925 11/11/14 2256   11/11/14 1930  cefTRIAXone (ROCEPHIN) 1 g in dextrose 5 % 50 mL IVPB     1 g 100 mL/hr over 30 Minutes Intravenous  Once 11/11/14 1925 11/11/14 2042        Subjective:   Emily Key was seen and examined today. Feeling better today, BP somewhat improved, off Cardene drip, still having chest pain/pleuritic.  Afebrile, denies any abdominal pain, N/V/D/C, new weakness, numbess, tingling.   Objective:   Blood pressure  176/89, pulse 75, temperature 97.6 F (36.4 C), temperature source Oral, resp. rate 21, height 5\' 8"  (1.727 m), weight 94.1 kg (207 lb 7.3 oz), last menstrual period 11/11/2014, SpO2 100 %.  Wt Readings from Last 3 Encounters:  11/15/14 94.1 kg (207 lb 7.3 oz)  04/03/14 89.812 kg (198 lb)  10/14/13 90.266 kg (199 lb)     Intake/Output Summary (Last 24 hours) at 11/18/14 1103 Last data filed at 11/18/14 0821  Gross per 24 hour  Intake 2298.16 ml  Output   2670 ml  Net -371.84 ml    Exam  General: Alert and oriented x 3, NAD  HEENT:  PERRLA, EOMI, Anicteic Sclera,  Neck: Supple, no JVD, no masses  CVS: S1 S2 clear, regular rate and rhythm  Respiratory: Decreased breath sounds at the bases. Chest tube x 1+,    men: Soft, NT, ND, NBS  Ext: no cyanosis clubbing or edema  Neuro: no new focal neurological deficits  Skin: No rashes  Psych: Normal affect and demeanor, alert and oriented 3   Data Review   Micro Results Recent Results (from the past 240 hour(s))  Blood culture (routine x 2)     Status: None (Preliminary result)   Collection Time: 11/11/14  7:58 PM  Result Value Ref Range Status   Specimen Description BLOOD RIGHT WRIST  Final   Special Requests BOTTLES DRAWN AEROBIC AND ANAEROBIC 5CC   Final   Culture   Final           BLOOD CULTURE RECEIVED NO GROWTH TO DATE CULTURE WILL BE HELD FOR 5 DAYS BEFORE ISSUING A FINAL NEGATIVE REPORT Performed at [facility]    Report Status PENDING  Incomplete  Blood culture (routine x 2)     Status: None (Preliminary result)   Collection Time: 11/11/14  8:01 PM  Result Value Ref Range Status   Specimen Description BLOOD LEFT WRIST  Final   Special Requests BOTTLES DRAWN AEROBIC AND ANAEROBIC 4CC  Final   Culture   Final           BLOOD CULTURE RECEIVED NO GROWTH TO DATE CULTURE WILL BE HELD FOR 5 DAYS BEFORE ISSUING A FINAL NEGATIVE REPORT Performed at [facility]    Report Status PENDING   Incomplete  AFB culture with smear     Status: None (Preliminary result)   Collection Time: 11/12/14 11:44 AM  Result Value Ref Range Status   Specimen Description FLUID LEFT PLEURAL  Final   Special Requests Normal  Final   Acid Fast Smear   Final    NO ACID FAST BACILLI SEEN Performed at [facility]    Culture   Final    CULTURE WILL BE EXAMINED FOR 6 WEEKS BEFORE ISSUING A FINAL REPORT Performed at [facility]    Report Status PENDING  Incomplete  Body fluid culture     Status: None   Collection Time: 11/12/14 11:44 AM  Result Value Ref Range Status   Specimen Description FLUID LEFT PLEURAL  Final   Special Requests Normal  Final   Gram Stain   Final    RARE WBC PRESENT, PREDOMINANTLY PMN NO ORGANISMS SEEN Performed at [facility]    Culture   Final    NO GROWTH 3 DAYS Performed at [facility]    Report Status 11/16/2014 FINAL  Final  Surgical pcr screen     Status: None   Collection Time: 11/14/14  5:37 AM  Result Value Ref Range Status   MRSA, PCR NEGATIVE NEGATIVE Final   Staphylococcus aureus NEGATIVE NEGATIVE Final    Comment:        The Xpert SA Assay (FDA approved for NASAL specimens in patients over 62 years of age), is one component of a comprehensive surveillance program.  Test performance has been validated by [facility] for patients greater than or equal to 47 year old. It is not intended to diagnose infection nor to guide or monitor treatment.   Fungus Culture with Smear     Status: None (Preliminary result)  Collection Time: 11/14/14 12:25 PM  Result Value Ref Range Status   Specimen Description FLUID PLEURAL  Final   Special Requests POF VANCY ZOSYN PART B  Final   Fungal Smear   Final    NO YEAST OR FUNGAL ELEMENTS SEEN Performed at [facility]    Culture   Final    CULTURE IN PROGRESS FOR FOUR WEEKS Performed at [facility]    Report Status PENDING  Incomplete  Body fluid culture      Status: None (Preliminary result)   Collection Time: 11/14/14 12:25 PM  Result Value Ref Range Status   Specimen Description FLUID PLEURAL  Final   Special Requests POF VANCY ZOSYN PART B  Final   Gram Stain   Final    NO WBC SEEN NO ORGANISMS SEEN Performed at [facility]    Culture   Final    NO GROWTH 2 DAYS Performed at [facility]    Report Status PENDING  Incomplete  AFB culture with smear     Status: None (Preliminary result)   Collection Time: 11/14/14 12:25 PM  Result Value Ref Range Status   Specimen Description FLUID PLEURAL  Final   Special Requests POF VANCY ZOSYN PART B  Final   Acid Fast Smear   Final    NO ACID FAST BACILLI SEEN Performed at [facility]    Culture   Final    CULTURE WILL BE EXAMINED FOR 6 WEEKS BEFORE ISSUING A FINAL REPORT Performed at [facility]    Report Status PENDING  Incomplete  Fungus Culture with Smear     Status: None (Preliminary result)   Collection Time: 11/14/14 12:28 PM  Result Value Ref Range Status   Specimen Description TISSUE PLEURAL  Final   Special Requests POF VANCY ZOSYN PART C  Final   Fungal Smear   Final    NO YEAST OR FUNGAL ELEMENTS SEEN Performed at [facility]    Culture   Final    CULTURE IN PROGRESS FOR FOUR WEEKS Performed at [facility]    Report Status PENDING  Incomplete  Tissue culture     Status: None   Collection Time: 11/14/14 12:28 PM  Result Value Ref Range Status   Specimen Description TISSUE PLEURAL  Final   Special Requests POF VANCY ZOSYN PART C  Final   Gram Stain   Final    NO WBC SEEN NO SQUAMOUS EPITHELIAL CELLS SEEN NO ORGANISMS SEEN Performed at [facility]    Culture   Final    NO GROWTH 3 DAYS Performed at [facility]    Report Status 11/18/2014 FINAL  Final  AFB culture with smear     Status: None (Preliminary result)   Collection Time: 11/14/14 12:28 PM  Result Value Ref Range Status   Specimen  Description TISSUE PLEURAL  Final   Special Requests POF VANCY ZOSYN PART C  Final   Acid Fast Smear   Final    NO ACID FAST BACILLI SEEN Performed at [facility]    Culture   Final    CULTURE WILL BE EXAMINED FOR 6 WEEKS BEFORE ISSUING A FINAL REPORT Performed at [facility]    Report Status PENDING  Incomplete  Anaerobic culture     Status: None (Preliminary result)   Collection Time: 11/14/14 12:46 PM  Result Value Ref Range Status   Specimen Description WOUND LEFT PLEURAL  Final  Special Requests POF VANCY ZOSYN PART D  Final   Gram Stain   Final    FEW WBC PRESENT,BOTH PMN AND MONONUCLEAR NO SQUAMOUS EPITHELIAL CELLS SEEN NO ORGANISMS SEEN Performed at [facility]    Culture   Final    NO ANAEROBES ISOLATED; CULTURE IN PROGRESS FOR 5 DAYS Performed at [facility]    Report Status PENDING  Incomplete  Wound culture     Status: None   Collection Time: 11/14/14 12:46 PM  Result Value Ref Range Status   Specimen Description WOUND LEFT PLEURAL  Final   Special Requests POF VANCY ZOSYN PART D  Final   Gram Stain   Final    FEW WBC PRESENT,BOTH PMN AND MONONUCLEAR NO SQUAMOUS EPITHELIAL CELLS SEEN NO ORGANISMS SEEN Performed at [facility]    Culture   Final    NO GROWTH 2 DAYS Performed at [facility]    Report Status 11/17/2014 FINAL  Final  Fungus Culture with Smear     Status: None (Preliminary result)   Collection Time: 11/14/14 12:54 PM  Result Value Ref Range Status   Specimen Description TISSUE LEFT PLEURAL  Final   Special Requests POF VANCY ZOSYN PART E  Final   Fungal Smear   Final    NO YEAST OR FUNGAL ELEMENTS SEEN Performed at [facility]    Culture   Final    CULTURE IN PROGRESS FOR FOUR WEEKS Performed at [facility]    Report Status PENDING  Incomplete  Tissue culture     Status: None   Collection Time: 11/14/14 12:54 PM  Result Value Ref Range Status   Specimen  Description TISSUE LEFT PLEURAL  Final   Special Requests POF VANCY ZOSYN PART E  Final   Gram Stain   Final    RARE WBC PRESENT, PREDOMINANTLY MONONUCLEAR NO ORGANISMS SEEN Performed at [facility]    Culture   Final    NO GROWTH 3 DAYS Performed at [facility]    Report Status 11/18/2014 FINAL  Final  AFB culture with smear     Status: None (Preliminary result)   Collection Time: 11/14/14 12:54 PM  Result Value Ref Range Status   Specimen Description TISSUE LEFT PLEURAL  Final   Special Requests POF VANCY ZOSYN PART E  Final   Acid Fast Smear   Final    NO ACID FAST BACILLI SEEN Performed at [facility]    Culture   Final    CULTURE WILL BE EXAMINED FOR 6 WEEKS BEFORE ISSUING A FINAL REPORT Performed at [facility]    Report Status PENDING  Incomplete    Radiology Reports Dg Chest 2 View  11/11/2014   CLINICAL DATA:  Left-sided chest pain and shortness of breath for 4 days  EXAM: CHEST  2 VIEW  COMPARISON:  June 21, 2013  FINDINGS: There is extensive consolidation throughout the left lower lobe. There is questionable infiltrate in the lingula as well. Right lung is clear. Heart is mildly enlarged with pulmonary vascularity within normal limits. No adenopathy.  IMPRESSION: Extensive left lower lobe airspace consolidation. There may also be consolidation in a portion of the lingula. Right lung is clear. No change in cardiac prominence.   Electronically Signed   By: Lowella Grip III M.D.   On: 11/11/2014 15:48   Ct Angio Chest Pe W/cm &/or Wo Cm  11/12/2014   CLINICAL DATA:  Chest pain and  short of breath.  EXAM: CT ANGIOGRAPHY CHEST WITH CONTRAST  TECHNIQUE: Multidetector CT imaging of the chest was performed using the standard protocol during bolus administration of intravenous contrast. Multiplanar CT image reconstructions and MIPs were obtained to evaluate the vascular anatomy.  CONTRAST:  159mL OMNIPAQUE IOHEXOL 350 MG/ML SOLN   COMPARISON:  Plain film 11/11/2014, CT abdomen 10/14/2013  FINDINGS: Mediastinum/Nodes: There is no filling defects within the proximal main pulmonary arteries or the interlobar pulmonary arteries. Evaluation of the lower lobe segmental pulmonary arteries is degraded by patient respiratory motion.  No acute findings of the aorta great vessels. No pericardial fluid. Left ventricle appears mildly enlarged. No mediastinal lymphadenopathy.  Lungs/Pleura: There is large left pleural effusion which occupies 70% of the left hemi thorax volume. There is atelectasis of the left upper lobe and left lower lobe associated with these large pleural effusion. There is no clear obstructing mass lesion obstructing the bronchus. The lung parenchyma itself is difficult to evaluate. The right lung is clear without evidence of fusion. There is mild atelectasis the right lung base.  Upper abdomen: Limited view of the liver, kidneys, pancreas are unremarkable. Normal adrenal glands.  No aggressive osseous lesion  Musculoskeletal: No aggressive osseous lesion  Review of the MIP images confirms the above findings.  IMPRESSION: 1. No evidence of acute pulmonary embolism within the main pulmonary arteries or intralobar pulmonary arteries. The lower lobe pulmonary arteries are poorly evaluated due to patient respiratory motion. 2. Large left pleural effusion occupying 70% of the left hemi thorax volume. There is passive atelectasis of the left lower lobe and left upper lobe. No obstructing mass lesion is identified.   Electronically Signed   By: Suzy Bouchard M.D.   On: 11/12/2014 01:08   Dg Chest Port 1 View  11/18/2014   CLINICAL DATA:  Left VA TS and drainage of left effusion, followup  EXAM: PORTABLE CHEST - 1 VIEW  COMPARISON:  Portable chest x-ray of 11/17/2014  FINDINGS: The left chest tubes have been removed. Matched that 1 of the 2 left chest tubes has been removed. No pneumothorax is seen. Haziness remains at the left lung  base. The right lung is clear. Right IJ central venous line is unchanged in position.  IMPRESSION: One of 2 left chest tubes removed.  No pneumothorax.   Electronically Signed   By: Ivar Drape M.D.   On: 11/18/2014 08:01   Dg Chest Port 1 View  11/17/2014   CLINICAL DATA:  Vats.  EXAM: PORTABLE CHEST - 1 VIEW  COMPARISON:  11/16/2014.  11/15/2014.  FINDINGS: Interim removal of lateral most chest tube. Remaining 2 chest tubes in stable position. Right IJ line stable position. Stable cardiomegaly with normal pulmonary vascularity. Persistent left base atelectasis and/or infiltrate.  IMPRESSION: 1. Interim removal of lateral most left chest tube. Remaining 2 chest tubes in stable position. No pneumothorax. 2. Persistent atelectasis and/or infiltrate left lower lobe. 3. Stable cardiomegaly   Electronically Signed   By: Marcello Moores  Register   On: 11/17/2014 07:52   Dg Chest Port 1 View  11/16/2014   CLINICAL DATA:  Status post left VATS with decortication and drainage of left pleural fluid.  EXAM: PORTABLE CHEST - 1 VIEW  COMPARISON:  Yesterday.  FINDINGS: Three left chest tubes remain in place with no pneumothorax seen. Right jugular catheter tip in the superior vena cava. Stable enlarged cardiac silhouette and left basilar opacity. Unremarkable bones.  IMPRESSION: 1. Three left chest tubes without pneumothorax. 2. Stable  cardiomegaly and left basilar atelectasis, pneumonia, pulmonary contusion or re-expansion edema.   Electronically Signed   By: Claudie Revering M.D.   On: 11/16/2014 08:37   Dg Chest Port 1 View  11/15/2014   CLINICAL DATA:  Chest tubes.  Dyspnea.  EXAM: PORTABLE CHEST - 1 VIEW  COMPARISON:  11/14/2014  FINDINGS: There are 3 left chest tubes. There is a right jugular central line extending into the SVC. There is no pneumothorax. There is left base consolidation. The right lung is clear.  IMPRESSION: Chest tubes appear unchanged in position. No pneumothorax. Unchanged left base consolidation.    Electronically Signed   By: Andreas Newport M.D.   On: 11/15/2014 06:20   Dg Chest Port 1 View  11/14/2014   CLINICAL DATA:  Empyema status post left lung decortication  EXAM: PORTABLE CHEST - 1 VIEW  COMPARISON:  11/12/2014  FINDINGS: Postsurgical changes in the left hemithorax. Two left apical chest tubes. Left basilar drain.  Small left pleural effusion, improved.  No pneumothorax is seen.  Right lung is clear.  Cardiomegaly.  Right IJ venous catheter terminates cavoatrial junction.  IMPRESSION: Postsurgical changes in the left hemithorax.  Small left pleural effusion, improved.  No pneumothorax is seen.  Two left apical chest tubes and left basilar drain.   Electronically Signed   By: Julian Hy M.D.   On: 11/14/2014 15:50   Dg Chest Port 1 View  11/12/2014   CLINICAL DATA:  Post thoracentesis.  EXAM: PORTABLE CHEST - 1 VIEW  COMPARISON:  11/11/2014  FINDINGS: Large left pleural effusion with left lower lobe atelectasis or infiltrate. No pneumothorax following left thoracentesis. No scratch head minimal right base opacity, likely atelectasis. Cardiomegaly. No acute bony abnormality.  IMPRESSION: Large left pleural effusion with left lower lobe atelectasis or infiltrate. No pneumothorax.   Electronically Signed   By: Rolm Baptise M.D.   On: 11/12/2014 12:27    CBC  Recent Labs Lab 11/12/14 0719  11/14/14 0426 11/15/14 0430 11/16/14 0430 11/17/14 0515 11/18/14 0418  WBC 18.3*  < > 18.6* 23.6* 21.7* 15.9* 14.0*  HGB 12.4  < > 12.4 11.4* 11.8* 12.2 11.4*  HCT 38.5  < > 37.9 34.5* 36.6 37.3 35.5*  PLT 409*  < > 474* 466* 508* 527* 510*  MCV 81.6  < > 79.0 78.4 80.4 79.4 79.2  MCH 26.3  < > 25.8* 25.9* 25.9* 26.0 25.4*  MCHC 32.2  < > 32.7 33.0 32.2 32.7 32.1  RDW 14.6  < > 14.5 14.7 14.8 14.6 14.8  LYMPHSABS 1.5  --   --   --   --   --   --   MONOABS 1.6*  --   --   --   --   --   --   EOSABS 0.0  --   --   --   --   --   --   BASOSABS 0.0  --   --   --   --   --   --   < > =  values in this interval not displayed.  Chemistries   Recent Labs Lab 11/12/14 0719  11/13/14 1434 11/14/14 0426 11/15/14 0430 11/16/14 0430 11/17/14 0515 11/18/14 0418  NA 136  < > 135 136 134* 134* 135 134*  K 3.5  < > 2.9* 3.6 3.1* 3.2* 4.1 3.6  CL 97*  < > 96* 95* 98* 96* 96* 96*  CO2 25  < > 31 31 28  29  30 29  GLUCOSE 77  < > 126* 123* 180* 122* 114* 118*  BUN 9  < > 14 12 11 8 8 9   CREATININE 0.88  < > 0.85 0.90 0.70 0.74 0.88 0.98  CALCIUM 8.8*  < > 8.9 9.0 8.3* 8.8* 9.0 8.8*  AST 14*  --  14*  --   --  14*  --   --   ALT 10*  --  9*  --   --  10*  --   --   ALKPHOS 82  --  86  --   --  68  --   --   BILITOT 0.8  --  0.7  --   --  0.7  --   --   < > = values in this interval not displayed. ------------------------------------------------------------------------------------------------------------------ estimated creatinine clearance is 87 mL/min (by C-G formula based on Cr of 0.98). ------------------------------------------------------------------------------------------------------------------ No results for input(s): HGBA1C in the last 72 hours. ------------------------------------------------------------------------------------------------------------------ No results for input(s): CHOL, HDL, LDLCALC, TRIG, CHOLHDL, LDLDIRECT in the last 72 hours. ------------------------------------------------------------------------------------------------------------------  Recent Labs  11/16/14 1343  TSH 1.485   ------------------------------------------------------------------------------------------------------------------ No results for input(s): VITAMINB12, FOLATE, FERRITIN, TIBC, IRON, RETICCTPCT in the last 72 hours.  Coagulation profile  Recent Labs Lab 11/13/14 1434  INR 1.21    No results for input(s): DDIMER in the last 72 hours.  Cardiac Enzymes  Recent Labs Lab 11/12/14 0719  TROPONINI 0.04*    ------------------------------------------------------------------------------------------------------------------ Invalid input(s): POCBNP  No results for input(s): GLUCAP in the last 72 hours.   Elwanda Moger M.D. [facility] 11/18/2014, 11:03 AM  Pager: [phone]   Between 7am to 7pm - call Pager - [phone]  After 7pm go to [email or web address] - password [email or web address]  Call night coverage person covering after 7pm

## 2014-11-18 NOTE — Progress Notes (Signed)
4 Days Post-Op Procedure(s) (LRB): LEFT VIDEO ASSISTED THORACOSCOPY (VATS)/DECORTICATION (Left) DRAINAGE OF LEFT PLEURAL EFFUSION (Left) Subjective: Feels better today Appetite only fair  Objective: Vital signs in last 24 hours: Temp:  [97.2 F (36.2 C)-98.8 F (37.1 C)] 97.6 F (36.4 C) (05/10 0757) Pulse Rate:  [41-91] 69 (05/10 0700) Cardiac Rhythm:  [-] Normal sinus rhythm (05/09 2000) Resp:  [12-40] 15 (05/10 0700) BP: (113-227)/(56-105) 153/64 mmHg (05/10 0700) SpO2:  [94 %-100 %] 98 % (05/10 0700)  Hemodynamic parameters for last 24 hours:    Intake/Output from previous day: 05/09 0701 - 05/10 0700 In: 2418.2 [P.O.:1081; I.V.:837.2; IV Piggyback:500] Out: 4128 [Urine:3250; Chest Tube:70] Intake/Output this shift:    General appearance: alert and no distress Neurologic: intact Heart: regular rate and rhythm Lungs: diminished breath sounds bibasilar L > R Abdomen: normal findings: spleen non-palpable no air leak Abdomen- soft, nontender  Lab Results:  Recent Labs  11/17/14 0515 11/18/14 0418  WBC 15.9* 14.0*  HGB 12.2 11.4*  HCT 37.3 35.5*  PLT 527* 510*   BMET:  Recent Labs  11/17/14 0515 11/18/14 0418  NA 135 134*  K 4.1 3.6  CL 96* 96*  CO2 30 29  GLUCOSE 114* 118*  BUN 8 9  CREATININE 0.88 0.98  CALCIUM 9.0 8.8*    PT/INR: No results for input(s): LABPROT, INR in the last 72 hours. ABG    Component Value Date/Time   PHART 7.395 11/15/2014 0428   HCO3 28.5* 11/15/2014 0428   TCO2 30 11/15/2014 0428   O2SAT 97.0 11/15/2014 0428   CBG (last 3)  No results for input(s): GLUCAP in the last 72 hours.  Assessment/Plan: S/P Procedure(s) (LRB): LEFT VIDEO ASSISTED THORACOSCOPY (VATS)/DECORTICATION (Left) DRAINAGE OF LEFT PLEURAL EFFUSION (Left) -  No air leak and minimal drainage from CT- dc CT Pain control is good Ambulate SCD and enoxaparin for DVT prophylaxis Hypertension- improved with management of medical team Plan per medical  team   LOS: 7 days    Melrose Nakayama 11/18/2014

## 2014-11-19 ENCOUNTER — Inpatient Hospital Stay (HOSPITAL_COMMUNITY): Payer: Medicaid Other

## 2014-11-19 DIAGNOSIS — R079 Chest pain, unspecified: Secondary | ICD-10-CM

## 2014-11-19 DIAGNOSIS — M79609 Pain in unspecified limb: Secondary | ICD-10-CM

## 2014-11-19 LAB — BASIC METABOLIC PANEL
ANION GAP: 12 (ref 5–15)
Anion gap: 10 (ref 5–15)
BUN: 13 mg/dL (ref 6–20)
BUN: 15 mg/dL (ref 6–20)
CALCIUM: 8.9 mg/dL (ref 8.9–10.3)
CO2: 28 mmol/L (ref 22–32)
CO2: 25 mmol/L (ref 22–32)
CREATININE: 2.1 mg/dL — AB (ref 0.44–1.00)
CREATININE: 2.46 mg/dL — AB (ref 0.44–1.00)
Calcium: 8.8 mg/dL — ABNORMAL LOW (ref 8.9–10.3)
Chloride: 96 mmol/L — ABNORMAL LOW (ref 101–111)
Chloride: 97 mmol/L — ABNORMAL LOW (ref 101–111)
GFR calc non Af Amer: 27 mL/min — ABNORMAL LOW (ref >60)
GFR, EST AFRICAN AMERICAN: 32 mL/min — AB (ref >60)
GFR, EST AFRICAN AMERICAN: 26 mL/min — AB (ref >60)
GFR, EST NON AFRICAN AMERICAN: 23 mL/min — AB (ref >60)
Glucose, Bld: 98 mg/dL (ref 70–99)
Glucose, Bld: 98 mg/dL (ref 70–99)
Potassium: 3.6 mmol/L (ref 3.5–5.1)
Potassium: 3.5 mmol/L (ref 3.5–5.1)
Sodium: 134 mmol/L — ABNORMAL LOW (ref 135–145)
Sodium: 134 mmol/L — ABNORMAL LOW (ref 135–145)

## 2014-11-19 LAB — CBC
HCT: 34.3 % — ABNORMAL LOW (ref 36.0–46.0)
Hemoglobin: 10.9 g/dL — ABNORMAL LOW (ref 12.0–15.0)
MCH: 25.2 pg — ABNORMAL LOW (ref 26.0–34.0)
MCHC: 31.8 g/dL (ref 30.0–36.0)
MCV: 79.4 fL (ref 78.0–100.0)
PLATELETS: 484 10*3/uL — AB (ref 150–400)
RBC: 4.32 MIL/uL (ref 3.87–5.11)
RDW: 15 % (ref 11.5–15.5)
WBC: 10.9 10*3/uL — AB (ref 4.0–10.5)

## 2014-11-19 LAB — ANAEROBIC CULTURE

## 2014-11-19 MED ORDER — IBUPROFEN 600 MG PO TABS: 600.0000 mg | ORAL_TABLET | Freq: Three times a day (TID) | ORAL | Status: DC | PRN

## 2014-11-19 MED ORDER — HYDROMORPHONE HCL 1 MG/ML IJ SOLN: 2.0000 mg | INTRAMUSCULAR | Status: DC | PRN

## 2014-11-19 MED ORDER — PANTOPRAZOLE SODIUM 40 MG PO TBEC
40.0000 mg | DELAYED_RELEASE_TABLET | Freq: Every day | ORAL | Status: DC
  Administered 2014-11-19 – 2014-11-22 (×4): 40 mg via ORAL
  Filled 2014-11-19 (×4): qty 1

## 2014-11-19 MED ORDER — IBUPROFEN 600 MG PO TABS: 600.0000 mg | ORAL_TABLET | Freq: Three times a day (TID) | ORAL | Status: DC

## 2014-11-19 MED ORDER — AMOXICILLIN-POT CLAVULANATE 875-125 MG PO TABS
1.0000 | ORAL_TABLET | Freq: Two times a day (BID) | ORAL | Status: DC
  Administered 2014-11-20 – 2014-11-22 (×5): 1 via ORAL
  Filled 2014-11-19 (×5): qty 1

## 2014-11-19 MED ORDER — KETOROLAC TROMETHAMINE 15 MG/ML IJ SOLN: 15.0000 mg | Freq: Four times a day (QID) | INTRAMUSCULAR | Status: DC | PRN

## 2014-11-19 MED ORDER — ACETAMINOPHEN 500 MG PO TABS
1000.0000 mg | ORAL_TABLET | Freq: Three times a day (TID) | ORAL | Status: DC
  Administered 2014-11-19 – 2014-11-22 (×10): 1000 mg via ORAL
  Filled 2014-11-19 (×10): qty 2

## 2014-11-19 MED ORDER — SODIUM CHLORIDE 0.9 % IV SOLN
INTRAVENOUS | Status: DC
  Administered 2014-11-19: 15:00:00 via INTRAVENOUS

## 2014-11-19 MED ORDER — ACETAMINOPHEN 325 MG PO TABS: 650.0000 mg | ORAL_TABLET | ORAL | Status: DC | PRN

## 2014-11-19 NOTE — Care Management Note (Signed)
Case Management Note  Patient Details  Name: Kindsey Eblin MRN: [id number] Date of Birth: 20-Jan-1971  Subjective/Objective:   Pt continues on IV zosyn. Tweaking BP medications. Pt is without insurance and will need to be d/c on generic medications once stable for d/c.                  Action/Plan: CM to continue to monitor for disposition needs.    Expected Discharge Date:  11/14/14               Expected Discharge Plan:     In-House Referral:     Discharge planning Services  CM Consult  Post Acute Care Choice:    Choice offered to:     DME Arranged:    DME Agency:     HH Arranged:    HH Agency:     Status of Service:  In process, will continue to follow  Medicare Important Message Given:    Date Medicare IM Given:    Medicare IM give by:    Date Additional Medicare IM Given:    Additional Medicare Important Message give by:     If discussed at [place] of [place] Meetings, dates discussed:    Additional Comments:  Bethena Roys, RN 11/19/2014, 2:49 PM

## 2014-11-19 NOTE — Progress Notes (Signed)
[facility]                                                                              Patient Demographics  Emily Key, is a 44 y.o. female, DOB - [birth year], ZOX:[id number]  Admit date - 11/11/2014   Admitting Physician Rise Patience, MD  Outpatient Primary MD for the patient is No PCP Per Patient  LOS - 8   Chief Complaint  Patient presents with  . Chest Pain       Brief HPI   Patient is a 44 year old female with hypertension, had not been on antihypertensives due to financial reasons presented to ED with left-sided pleuritic chest pain. Patient reported that her symptoms started with nonproductive cough 5 days prior to admission and in the last 3 days she was having left-sided chest pain on deep inspiration. She also reported some diaphoresis. Chest x-ray showed left-sided pneumonia and patient was admitted for further workup. CT of the chest showed large pleural effusion with extensive consolidation. Pulmonary was consulted but unable to do therapeutic thoracentesis due to loculated pleural effusion. CT surgery performed VATS on 5/6. Patient has been stable from CT surgery standpoint, having issues with uncontrolled hypertension. She was placed on Cardene drip on 5/9, now weaned off   Assessment & Plan    Principal Problem:   CAP (community acquired pneumonia)/extensive left-sided consolidation with loculated pleural effusion, abscess s/p VATS 5/6 - CT of the chest showed no pulmonary embolism, however large left pleural effusion occupying 70% of the left hemithorax with passive atelectasis but no obstructive mass lesion is identified. Pulmonary consulted, patient underwent thoracentesis, only 300 mL of cloudy yellow pleural fluid was obtained and studies sent, consistent with empyema, WBCs 1493, 75% neutrophils.   - CT surgery consulted, underwent VATS on 5/6 - Continue IV vancomycin and Zosyn-> will switch to oral Augmentin for a month at the time of  DC - Management per cardiothoracic surgery   Active Problems: Leukocytosis: Likely due to #1 - Afebrile, continue vancomycin and Zosyn   Uncontrolled hypertension: Patient had not been taking any medications due to financial reasons, was on 3 antihypertensives, last taken in September of last year: Improving, off Cardene drip - Currently on lisinopril 40 mg daily, HCTZ 25mg , clonidine 0.3mg , lasix 40mg  x1 5/8. Placed on labetalol and hydralazine. - Echo still not done, renal duplex not done  - TSH normal, renin aldosterone ratio normal, metanephrines pending, UA negative for proteinuria - Pain also driving her BP high-> placed on Dilaudid when necessary  Acute kidney injury - will place on IV fluids for 1 L and then reassess. I have ordered a stat renal ultrasound Doppler to rule out renal artery stenosis. May need to DC HCTZ and lisinopril if creatinine function continues to worsen.    Pleuritic chest pain - Continue Tylenol scheduled, added Dilaudid as needed, for breakthrough pain continue oxycodone  Code Status:Full code  Family Communication: Discussed in detail with the patient, all imaging results, lab results explained to the patient    Disposition Plan: Not medically ready, multiple imaging pending, AKI  Time Spent in minutes   25 minutes  Procedures  Thoracentesis CT chest   Consults   Pulmonology CT surgery   DVT Prophylaxis   Lovenox   Medications  Scheduled Meds: . acetaminophen  1,000 mg Oral 4 times per day  . bisacodyl  10 mg Oral Daily  . cloNIDine  0.3 mg Transdermal Weekly  . enoxaparin (LOVENOX) injection  40 mg Subcutaneous Q24H  . feeding supplement (ENSURE ENLIVE)  237 mL Oral BID BM  . hydrALAZINE  50 mg Oral 3 times per day  . hydrochlorothiazide  25 mg Oral Daily  . ibuprofen  600 mg Oral TID  . labetalol  200 mg Oral BID  . lisinopril  40 mg Oral Daily  . piperacillin-tazobactam (ZOSYN)  IV  3.375 g Intravenous Q8H  . polyethylene glycol   17 g Oral Daily  . senna-docusate  1 tablet Oral QHS  . sodium chloride  10-40 mL Intracatheter Q12H  . sodium chloride  3 mL Intravenous Q12H   Continuous Infusions:   PRN Meds:.albuterol, hydrALAZINE, HYDROmorphone (DILAUDID) injection, ketorolac, labetalol, ondansetron (ZOFRAN) IV, oxyCODONE, potassium chloride, sodium chloride, sodium chloride, traMADol   Antibiotics   Anti-infectives    Start     Dose/Rate Route Frequency Ordered Stop   11/14/14 1100  vancomycin (VANCOCIN) IVPB 1000 mg/200 mL premix  Status:  Discontinued     1,000 mg 200 mL/hr over 60 Minutes Intravenous To [facility] 11/13/14 1235 11/14/14 1708   11/13/14 1600  piperacillin-tazobactam (ZOSYN) IVPB 3.375 g     3.375 g 12.5 mL/hr over 240 Minutes Intravenous Every 8 hours 11/13/14 1518     11/13/14 1600  vancomycin (VANCOCIN) IVPB 1000 mg/200 mL premix  Status:  Discontinued     1,000 mg 200 mL/hr over 60 Minutes Intravenous Every 8 hours 11/13/14 1518 11/19/14 1031   11/12/14 2200  azithromycin (ZITHROMAX) tablet 500 mg  Status:  Discontinued     500 mg Oral Daily at bedtime 11/12/14 1045 11/13/14 1453   11/12/14 2000  cefTRIAXone (ROCEPHIN) 1 g in dextrose 5 % 50 mL IVPB - Premix  Status:  Discontinued     1 g 100 mL/hr over 30 Minutes Intravenous Every 24 hours 11/11/14 2142 11/13/14 1453   11/11/14 2200  azithromycin (ZITHROMAX) 500 mg in dextrose 5 % 250 mL IVPB  Status:  Discontinued     500 mg 250 mL/hr over 60 Minutes Intravenous Every 24 hours 11/11/14 2142 11/12/14 1045   11/11/14 1930  azithromycin (ZITHROMAX) 500 mg in dextrose 5 % 250 mL IVPB  Status:  Discontinued     500 mg 250 mL/hr over 60 Minutes Intravenous  Once 11/11/14 1925 11/11/14 2256   11/11/14 1930  cefTRIAXone (ROCEPHIN) 1 g in dextrose 5 % 50 mL IVPB     1 g 100 mL/hr over 30 Minutes Intravenous  Once 11/11/14 1925 11/11/14 2042        Subjective:   Emily Key was seen and examined today. Although BP was  improving, some elevated readings due to uncontrolled pain. still having chest pain/pleuritic.  Afebrile, denies any abdominal pain, N/V/D/C, new weakness, numbess, tingling.   Objective:   Blood pressure 189/71, pulse 67, temperature 97.7 F (36.5 C), temperature source Oral, resp. rate 23, height 5\' 8"  (1.727 m), weight 89.994 kg (198 lb 6.4 oz), last menstrual period 11/11/2014, SpO2 97 %.  Wt Readings from Last 3 Encounters:  11/19/14 89.994 kg (198 lb 6.4 oz)  04/03/14 89.812 kg (198 lb)  10/14/13 90.266 kg (199 lb)  Intake/Output Summary (Last 24 hours) at 11/19/14 1059 Last data filed at 11/19/14 0500  Gross per 24 hour  Intake    960 ml  Output    300 ml  Net    660 ml    Exam  General: Alert and oriented x 3, NAD, uncomfortable due to pain  HEENT:  PERRLA, EOMI, Anicteic Sclera,  Neck: Supple, no JVD, no masses  CVS: S1 S2 clear, RRR  Respiratory: Decreased breath sounds at the bases, chest tubes off  Abdomen: Soft, NT, ND, NBS  Ext: no c/c/e  Neuro: no new focal neurological deficits  Skin: No rashes  Psych: Normal affect and demeanor, alert and oriented 3   Data Review   Micro Results Recent Results (from the past 240 hour(s))  Blood culture (routine x 2)     Status: None   Collection Time: 11/11/14  7:58 PM  Result Value Ref Range Status   Specimen Description BLOOD RIGHT WRIST  Final   Special Requests BOTTLES DRAWN AEROBIC AND ANAEROBIC 5CC   Final   Culture   Final    NO GROWTH 5 DAYS Performed at [facility]    Report Status 11/18/2014 FINAL  Final  Blood culture (routine x 2)     Status: None   Collection Time: 11/11/14  8:01 PM  Result Value Ref Range Status   Specimen Description BLOOD LEFT WRIST  Final   Special Requests BOTTLES DRAWN AEROBIC AND ANAEROBIC 4CC  Final   Culture   Final    NO GROWTH 5 DAYS Performed at [facility]    Report Status 11/18/2014 FINAL  Final  AFB culture with smear     Status:  None (Preliminary result)   Collection Time: 11/12/14 11:44 AM  Result Value Ref Range Status   Specimen Description FLUID LEFT PLEURAL  Final   Special Requests Normal  Final   Acid Fast Smear   Final    NO ACID FAST BACILLI SEEN Performed at [facility]    Culture   Final    CULTURE WILL BE EXAMINED FOR 6 WEEKS BEFORE ISSUING A FINAL REPORT Performed at [facility]    Report Status PENDING  Incomplete  Body fluid culture     Status: None   Collection Time: 11/12/14 11:44 AM  Result Value Ref Range Status   Specimen Description FLUID LEFT PLEURAL  Final   Special Requests Normal  Final   Gram Stain   Final    RARE WBC PRESENT, PREDOMINANTLY PMN NO ORGANISMS SEEN Performed at [facility]    Culture   Final    NO GROWTH 3 DAYS Performed at [facility]    Report Status 11/16/2014 FINAL  Final  Surgical pcr screen     Status: None   Collection Time: 11/14/14  5:37 AM  Result Value Ref Range Status   MRSA, PCR NEGATIVE NEGATIVE Final   Staphylococcus aureus NEGATIVE NEGATIVE Final    Comment:        The Xpert SA Assay (FDA approved for NASAL specimens in patients over 67 years of age), is one component of a comprehensive surveillance program.  Test performance has been validated by [facility] for patients greater than or equal to 47 year old. It is not intended to diagnose infection nor to guide or monitor treatment.   Fungus Culture with Smear     Status: None (Preliminary result)   Collection Time: 11/14/14 12:25 PM  Result Value  Ref Range Status   Specimen Description FLUID PLEURAL  Final   Special Requests POF VANCY ZOSYN PART B  Final   Fungal Smear   Final    NO YEAST OR FUNGAL ELEMENTS SEEN Performed at [facility]    Culture   Final    CULTURE IN PROGRESS FOR FOUR WEEKS Performed at [facility]    Report Status PENDING  Incomplete  Body fluid culture     Status: None   Collection Time: 11/14/14  12:25 PM  Result Value Ref Range Status   Specimen Description FLUID PLEURAL  Final   Special Requests POF VANCY ZOSYN PART B  Final   Gram Stain   Final    NO WBC SEEN NO ORGANISMS SEEN Performed at [facility]    Culture   Final    NO GROWTH 3 DAYS Performed at [facility]    Report Status 11/18/2014 FINAL  Final  AFB culture with smear     Status: None (Preliminary result)   Collection Time: 11/14/14 12:25 PM  Result Value Ref Range Status   Specimen Description FLUID PLEURAL  Final   Special Requests POF VANCY ZOSYN PART B  Final   Acid Fast Smear   Final    NO ACID FAST BACILLI SEEN Performed at [facility]    Culture   Final    CULTURE WILL BE EXAMINED FOR 6 WEEKS BEFORE ISSUING A FINAL REPORT Performed at [facility]    Report Status PENDING  Incomplete  Fungus Culture with Smear     Status: None (Preliminary result)   Collection Time: 11/14/14 12:28 PM  Result Value Ref Range Status   Specimen Description TISSUE PLEURAL  Final   Special Requests POF VANCY ZOSYN PART C  Final   Fungal Smear   Final    NO YEAST OR FUNGAL ELEMENTS SEEN Performed at [facility]    Culture   Final    CULTURE IN PROGRESS FOR FOUR WEEKS Performed at [facility]    Report Status PENDING  Incomplete  Tissue culture     Status: None   Collection Time: 11/14/14 12:28 PM  Result Value Ref Range Status   Specimen Description TISSUE PLEURAL  Final   Special Requests POF VANCY ZOSYN PART C  Final   Gram Stain   Final    NO WBC SEEN NO SQUAMOUS EPITHELIAL CELLS SEEN NO ORGANISMS SEEN Performed at [facility]    Culture   Final    NO GROWTH 3 DAYS Performed at [facility]    Report Status 11/18/2014 FINAL  Final  AFB culture with smear     Status: None (Preliminary result)   Collection Time: 11/14/14 12:28 PM  Result Value Ref Range Status   Specimen Description TISSUE PLEURAL  Final   Special Requests POF  VANCY ZOSYN PART C  Final   Acid Fast Smear   Final    NO ACID FAST BACILLI SEEN Performed at [facility]    Culture   Final    CULTURE WILL BE EXAMINED FOR 6 WEEKS BEFORE ISSUING A FINAL REPORT Performed at [facility]    Report Status PENDING  Incomplete  Anaerobic culture     Status: None   Collection Time: 11/14/14 12:46 PM  Result Value Ref Range Status   Specimen Description WOUND LEFT PLEURAL  Final   Special Requests POF VANCY ZOSYN PART D  Final  Gram Stain   Final    FEW WBC PRESENT,BOTH PMN AND MONONUCLEAR NO SQUAMOUS EPITHELIAL CELLS SEEN NO ORGANISMS SEEN Performed at [facility]    Culture   Final    NO ANAEROBES ISOLATED Performed at [facility]    Report Status 11/19/2014 FINAL  Final  Wound culture     Status: None   Collection Time: 11/14/14 12:46 PM  Result Value Ref Range Status   Specimen Description WOUND LEFT PLEURAL  Final   Special Requests POF VANCY ZOSYN PART D  Final   Gram Stain   Final    FEW WBC PRESENT,BOTH PMN AND MONONUCLEAR NO SQUAMOUS EPITHELIAL CELLS SEEN NO ORGANISMS SEEN Performed at [facility]    Culture   Final    NO GROWTH 2 DAYS Performed at [facility]    Report Status 11/17/2014 FINAL  Final  Fungus Culture with Smear     Status: None (Preliminary result)   Collection Time: 11/14/14 12:54 PM  Result Value Ref Range Status   Specimen Description TISSUE LEFT PLEURAL  Final   Special Requests POF VANCY ZOSYN PART E  Final   Fungal Smear   Final    NO YEAST OR FUNGAL ELEMENTS SEEN Performed at [facility]    Culture   Final    CULTURE IN PROGRESS FOR FOUR WEEKS Performed at [facility]    Report Status PENDING  Incomplete  Tissue culture     Status: None   Collection Time: 11/14/14 12:54 PM  Result Value Ref Range Status   Specimen Description TISSUE LEFT PLEURAL  Final   Special Requests POF VANCY ZOSYN PART E  Final   Gram Stain   Final     RARE WBC PRESENT, PREDOMINANTLY MONONUCLEAR NO ORGANISMS SEEN Performed at [facility]    Culture   Final    NO GROWTH 3 DAYS Performed at [facility]    Report Status 11/18/2014 FINAL  Final  AFB culture with smear     Status: None (Preliminary result)   Collection Time: 11/14/14 12:54 PM  Result Value Ref Range Status   Specimen Description TISSUE LEFT PLEURAL  Final   Special Requests POF VANCY ZOSYN PART E  Final   Acid Fast Smear   Final    NO ACID FAST BACILLI SEEN Performed at [facility]    Culture   Final    CULTURE WILL BE EXAMINED FOR 6 WEEKS BEFORE ISSUING A FINAL REPORT Performed at [facility]    Report Status PENDING  Incomplete    Radiology Reports Dg Chest 2 View  11/19/2014   CLINICAL DATA:  Left-sided empyema. Chest discomfort. Empyema J86.9 (ICD-10-CM)  EXAM: CHEST  2 VIEW  COMPARISON:  11/18/2014  FINDINGS: Left inferior hemi thorax chest tube has been removed since the previous day's study. There is no pneumothorax. Left basilar opacity appears improved. Small mild of opacity at the left apex is consistent with atelectasis or scarring.  There is mild subsegmental atelectasis at the medial right lung base. No new areas of lung consolidation.  The residual pleural fluid.  Cardiac silhouette is mildly enlarged. No mediastinal or hilar masses.  Right IJ central venous line is stable from the previous day's study.  IMPRESSION: 1. Status post removal of the left-sided chest tube since the prior exam. No pneumothorax. 2. Significant improvement. Mild residual left basilar opacity may all be atelectasis. There is no residual left pleural effusion.  No new abnormalities.   Electronically Signed   By: Lajean Manes M.D.   On: 11/19/2014 08:39   Dg Chest 2 View  11/11/2014   CLINICAL DATA:  Left-sided chest pain and shortness of breath for 4 days  EXAM: CHEST  2 VIEW  COMPARISON:  June 21, 2013  FINDINGS: There is extensive consolidation  throughout the left lower lobe. There is questionable infiltrate in the lingula as well. Right lung is clear. Heart is mildly enlarged with pulmonary vascularity within normal limits. No adenopathy.  IMPRESSION: Extensive left lower lobe airspace consolidation. There may also be consolidation in a portion of the lingula. Right lung is clear. No change in cardiac prominence.   Electronically Signed   By: Lowella Grip III M.D.   On: 11/11/2014 15:48   Ct Angio Chest Pe W/cm &/or Wo Cm  11/12/2014   CLINICAL DATA:  Chest pain and short of breath.  EXAM: CT ANGIOGRAPHY CHEST WITH CONTRAST  TECHNIQUE: Multidetector CT imaging of the chest was performed using the standard protocol during bolus administration of intravenous contrast. Multiplanar CT image reconstructions and MIPs were obtained to evaluate the vascular anatomy.  CONTRAST:  177mL OMNIPAQUE IOHEXOL 350 MG/ML SOLN  COMPARISON:  Plain film 11/11/2014, CT abdomen 10/14/2013  FINDINGS: Mediastinum/Nodes: There is no filling defects within the proximal main pulmonary arteries or the interlobar pulmonary arteries. Evaluation of the lower lobe segmental pulmonary arteries is degraded by patient respiratory motion.  No acute findings of the aorta great vessels. No pericardial fluid. Left ventricle appears mildly enlarged. No mediastinal lymphadenopathy.  Lungs/Pleura: There is large left pleural effusion which occupies 70% of the left hemi thorax volume. There is atelectasis of the left upper lobe and left lower lobe associated with these large pleural effusion. There is no clear obstructing mass lesion obstructing the bronchus. The lung parenchyma itself is difficult to evaluate. The right lung is clear without evidence of fusion. There is mild atelectasis the right lung base.  Upper abdomen: Limited view of the liver, kidneys, pancreas are unremarkable. Normal adrenal glands.  No aggressive osseous lesion  Musculoskeletal: No aggressive osseous lesion  Review  of the MIP images confirms the above findings.  IMPRESSION: 1. No evidence of acute pulmonary embolism within the main pulmonary arteries or intralobar pulmonary arteries. The lower lobe pulmonary arteries are poorly evaluated due to patient respiratory motion. 2. Large left pleural effusion occupying 70% of the left hemi thorax volume. There is passive atelectasis of the left lower lobe and left upper lobe. No obstructing mass lesion is identified.   Electronically Signed   By: Suzy Bouchard M.D.   On: 11/12/2014 01:08   Dg Chest Port 1 View  11/18/2014   CLINICAL DATA:  Left VA TS and drainage of left effusion, followup  EXAM: PORTABLE CHEST - 1 VIEW  COMPARISON:  Portable chest x-ray of 11/17/2014  FINDINGS: The left chest tubes have been removed. Matched that 1 of the 2 left chest tubes has been removed. No pneumothorax is seen. Haziness remains at the left lung base. The right lung is clear. Right IJ central venous line is unchanged in position.  IMPRESSION: One of 2 left chest tubes removed.  No pneumothorax.   Electronically Signed   By: Ivar Drape M.D.   On: 11/18/2014 08:01   Dg Chest Port 1 View  11/17/2014   CLINICAL DATA:  Vats.  EXAM: PORTABLE CHEST - 1 VIEW  COMPARISON:  11/16/2014.  11/15/2014.  FINDINGS: Interim removal  of lateral most chest tube. Remaining 2 chest tubes in stable position. Right IJ line stable position. Stable cardiomegaly with normal pulmonary vascularity. Persistent left base atelectasis and/or infiltrate.  IMPRESSION: 1. Interim removal of lateral most left chest tube. Remaining 2 chest tubes in stable position. No pneumothorax. 2. Persistent atelectasis and/or infiltrate left lower lobe. 3. Stable cardiomegaly   Electronically Signed   By: Marcello Moores  Register   On: 11/17/2014 07:52   Dg Chest Port 1 View  11/16/2014   CLINICAL DATA:  Status post left VATS with decortication and drainage of left pleural fluid.  EXAM: PORTABLE CHEST - 1 VIEW  COMPARISON:  Yesterday.   FINDINGS: Three left chest tubes remain in place with no pneumothorax seen. Right jugular catheter tip in the superior vena cava. Stable enlarged cardiac silhouette and left basilar opacity. Unremarkable bones.  IMPRESSION: 1. Three left chest tubes without pneumothorax. 2. Stable cardiomegaly and left basilar atelectasis, pneumonia, pulmonary contusion or re-expansion edema.   Electronically Signed   By: Claudie Revering M.D.   On: 11/16/2014 08:37   Dg Chest Port 1 View  11/15/2014   CLINICAL DATA:  Chest tubes.  Dyspnea.  EXAM: PORTABLE CHEST - 1 VIEW  COMPARISON:  11/14/2014  FINDINGS: There are 3 left chest tubes. There is a right jugular central line extending into the SVC. There is no pneumothorax. There is left base consolidation. The right lung is clear.  IMPRESSION: Chest tubes appear unchanged in position. No pneumothorax. Unchanged left base consolidation.   Electronically Signed   By: Andreas Newport M.D.   On: 11/15/2014 06:20   Dg Chest Port 1 View  11/14/2014   CLINICAL DATA:  Empyema status post left lung decortication  EXAM: PORTABLE CHEST - 1 VIEW  COMPARISON:  11/12/2014  FINDINGS: Postsurgical changes in the left hemithorax. Two left apical chest tubes. Left basilar drain.  Small left pleural effusion, improved.  No pneumothorax is seen.  Right lung is clear.  Cardiomegaly.  Right IJ venous catheter terminates cavoatrial junction.  IMPRESSION: Postsurgical changes in the left hemithorax.  Small left pleural effusion, improved.  No pneumothorax is seen.  Two left apical chest tubes and left basilar drain.   Electronically Signed   By: Julian Hy M.D.   On: 11/14/2014 15:50   Dg Chest Port 1 View  11/12/2014   CLINICAL DATA:  Post thoracentesis.  EXAM: PORTABLE CHEST - 1 VIEW  COMPARISON:  11/11/2014  FINDINGS: Large left pleural effusion with left lower lobe atelectasis or infiltrate. No pneumothorax following left thoracentesis. No scratch head minimal right base opacity, likely  atelectasis. Cardiomegaly. No acute bony abnormality.  IMPRESSION: Large left pleural effusion with left lower lobe atelectasis or infiltrate. No pneumothorax.   Electronically Signed   By: Rolm Baptise M.D.   On: 11/12/2014 12:27    CBC  Recent Labs Lab 11/15/14 0430 11/16/14 0430 11/17/14 0515 11/18/14 0418 11/19/14 0605  WBC 23.6* 21.7* 15.9* 14.0* 10.9*  HGB 11.4* 11.8* 12.2 11.4* 10.9*  HCT 34.5* 36.6 37.3 35.5* 34.3*  PLT 466* 508* 527* 510* 484*  MCV 78.4 80.4 79.4 79.2 79.4  MCH 25.9* 25.9* 26.0 25.4* 25.2*  MCHC 33.0 32.2 32.7 32.1 31.8  RDW 14.7 14.8 14.6 14.8 15.0    Chemistries   Recent Labs Lab 11/13/14 1434  11/15/14 0430 11/16/14 0430 11/17/14 0515 11/18/14 0418 11/19/14 0605  NA 135  < > 134* 134* 135 134* 134*  K 2.9*  < > 3.1* 3.2* 4.1  3.6 3.6  CL 96*  < > 98* 96* 96* 96* 96*  CO2 31  < > 28 29 30 29 28   GLUCOSE 126*  < > 180* 122* 114* 118* 98  BUN 14  < > 11 8 8 9 13   CREATININE 0.85  < > 0.70 0.74 0.88 0.98 2.10*  CALCIUM 8.9  < > 8.3* 8.8* 9.0 8.8* 8.8*  AST 14*  --   --  14*  --   --   --   ALT 9*  --   --  10*  --   --   --   ALKPHOS 86  --   --  68  --   --   --   BILITOT 0.7  --   --  0.7  --   --   --   < > = values in this interval not displayed. ------------------------------------------------------------------------------------------------------------------ estimated creatinine clearance is 39.7 mL/min (by C-G formula based on Cr of 2.1). ------------------------------------------------------------------------------------------------------------------ No results for input(s): HGBA1C in the last 72 hours. ------------------------------------------------------------------------------------------------------------------ No results for input(s): CHOL, HDL, LDLCALC, TRIG, CHOLHDL, LDLDIRECT in the last 72 hours. ------------------------------------------------------------------------------------------------------------------  Recent Labs   11/16/14 1343  TSH 1.485   ------------------------------------------------------------------------------------------------------------------ No results for input(s): VITAMINB12, FOLATE, FERRITIN, TIBC, IRON, RETICCTPCT in the last 72 hours.  Coagulation profile  Recent Labs Lab 11/13/14 1434  INR 1.21    No results for input(s): DDIMER in the last 72 hours.  Cardiac Enzymes No results for input(s): CKMB, TROPONINI, MYOGLOBIN in the last 168 hours.  Invalid input(s): CK ------------------------------------------------------------------------------------------------------------------ Invalid input(s): POCBNP  No results for input(s): GLUCAP in the last 72 hours.   Maegan Buller M.D. [facility] 11/19/2014, 10:59 AM  Pager: [phone]   Between 7am to 7pm - call Pager - [phone]  After 7pm go to [email or web address] - password [email or web address]  Call night coverage person covering after 7pm

## 2014-11-19 NOTE — Progress Notes (Signed)
  Echocardiogram 2D Echocardiogram has been performed.  Emily Key 11/19/2014, 3:26 PM

## 2014-11-19 NOTE — Progress Notes (Signed)
*  PRELIMINARY RESULTS* Vascular Ultrasound Renal Artery Duplex has been completed.   Study was technically difficult and limited due to incision location. There is no obvious evidence of hemodynamically significant renal artery stenosis >60% bilaterally.   11/19/2014  Maudry Mayhew, RVT, RDCS, RDMS

## 2014-11-19 NOTE — Progress Notes (Addendum)
      [address]       ,[place] [address]             [phone]       5 Days Post-Op Procedure(s) (LRB): LEFT VIDEO ASSISTED THORACOSCOPY (VATS)/DECORTICATION (Left) DRAINAGE OF LEFT PLEURAL EFFUSION (Left)  Subjective: Patient just returned from CXR. She has no specific complaints this am.  Objective: Vital signs in last 24 hours: Temp:  [97.5 F (36.4 C)-98.6 F (37 C)] 97.7 F (36.5 C) (05/11 0801) Pulse Rate:  [60-84] 60 (05/11 0801) Cardiac Rhythm:  [-] Normal sinus rhythm (05/10 2000) Resp:  [11-32] 12 (05/11 0801) BP: (115-193)/(58-101) 136/69 mmHg (05/11 0801) SpO2:  [95 %-100 %] 100 % (05/11 0801) Weight:  [193 lb 11.2 oz (87.862 kg)-198 lb 6.4 oz (89.994 kg)] 198 lb 6.4 oz (89.994 kg) (05/11 0400)     Intake/Output from previous day: 05/10 0701 - 05/11 0700 In: 1210 [P.O.:360; IV Piggyback:750] Out: 300 [Urine:300]   Physical Exam:  Cardiovascular: RRR Pulmonary: Diminished at left base and clear on right; no rales, wheezes, or rhonchi. Abdomen: Soft, non tender, bowel sounds present. Extremities: SCDs in place Wounds: Clean and dry.  No erythema or signs of infection.   Lab Results: CBC: Recent Labs  11/18/14 0418 11/19/14 0605  WBC 14.0* 10.9*  HGB 11.4* 10.9*  HCT 35.5* 34.3*  PLT 510* 484*   BMET:  Recent Labs  11/18/14 0418 11/19/14 0605  NA 134* 134*  K 3.6 3.6  CL 96* 96*  CO2 29 28  GLUCOSE 118* 98  BUN 9 13  CREATININE 0.98 2.10*  CALCIUM 8.8* 8.8*    PT/INR: No results for input(s): LABPROT, INR in the last 72 hours. ABG:  INR: Will add last result for INR, ABG once components are confirmed Will add last 4 CBG results once components are confirmed  Assessment/Plan:  1. CV - SR. BP much improved (not as hypertensive). Cardene drip stopped yesterday. On Clonidine 0.3 mg TD, Hydralazine 50 mg tid, HCTZ 25 mg daily, Lisinopril 40 mg daily, and Labetalol 200 mg bid. 2.  Pulmonary - Last chest tube  removed yesterday. On room air. CXR this am appears to show no pneumothorax, persistent haziness left base. Encourage incentive spirometer. 3. Anemia- H and H 10.9 and 34.3 4. Creatinine increased from 0.98 to 2.10. On ACE, HCTZ, Vanco.. Per medicine. 5.ID-On Vanco and Zosyn for PNA 6. Follow up appointment with Dr. Roxan Hockey will be arranged  ZIMMERMAN,DONIELLE MPA-C 11/19/2014,8:20 AM  Patient seen and examined. She looks great Incision is OK CXR looks good HTN remains a problem- being addressed by Medical service Sudden increase in creatinine is concerning- will dc vancomycin Can probably go ahead and change to PO antibiotics tomorrow  Remo Lipps C. Roxan Hockey, MD [facility] [phone]

## 2014-11-19 NOTE — Evaluation (Signed)
Occupational Therapy Evaluation Patient Details Name: Emily Key MRN: [id number] DOB: June 22, 1971 Today's Date: 11/19/2014    History of Present Illness This 44 y.o. female adittemd with Lt sided pleuritic chest pain.  X-ray showed PNA.  Pt underwent VATS on 5/6 for drainage of empyema. SHe had remained stable from surgical standpoint, but experiencing uncontrolled HTN requiring Cardene drip which was weaned 5/9.  PMH HTN    Clinical Impression   Patient evaluated by Occupational Therapy with no further acute OT needs identified. All education has been completed and the patient has no further questions. Pt is able to perform ADLs modified independent, no skilled OT needs identified.  See below for any follow-up Occupational Therapy or equipment needs. OT is signing off. Thank you for this referral.      Follow Up Recommendations  No OT follow up;Supervision - Intermittent    Equipment Recommendations  None recommended by OT    Recommendations for Other Services       Precautions / Restrictions Precautions Precautions: None      Mobility Bed Mobility Overal bed mobility: Modified Independent                Transfers Overall transfer level: Modified independent Equipment used: None                  Balance Overall balance assessment: No apparent balance deficits (not formally assessed)                                          ADL Overall ADL's : Modified independent                                             Vision     Perception     Praxis      Pertinent Vitals/Pain Pain Assessment: No/denies pain (but moves in guarded fashion )     Hand Dominance Right   Extremity/Trunk Assessment Upper Extremity Assessment Upper Extremity Assessment: Overall WFL for tasks assessed   Lower Extremity Assessment Lower Extremity Assessment: Defer to PT evaluation   Cervical / Trunk Assessment Cervical / Trunk  Assessment: Normal   Communication Communication Communication: No difficulties   Cognition Arousal/Alertness: Awake/alert Behavior During Therapy: WFL for tasks assessed/performed Overall Cognitive Status: Within Functional Limits for tasks assessed                     General Comments       Exercises       Shoulder Instructions      Home Living Family/patient expects to be discharged to:: Private residence Living Arrangements: Children Available Help at Discharge: Family;Available PRN/intermittently Type of Home: Apartment Home Access: Stairs to enter Entrance Stairs-Number of Steps: 1 flight  Entrance Stairs-Rails: Right;Left Home Layout: One level     Bathroom Shower/Tub: Tub/shower unit;Curtain Shower/tub characteristics: Curtain       Home Equipment: None          Prior Functioning/Environment Level of Independence: Independent        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     OT Diagnosis:     OT Problem List:     OT Treatment/Interventions:      OT Goals(Current goals can be found in the care  plan section) Acute Rehab OT Goals OT Goal Formulation: All assessment and education complete, DC therapy  OT Frequency:     Barriers to D/C:            Co-evaluation              End of Session Nurse Communication: Mobility status  Activity Tolerance: Patient tolerated treatment well Patient left: in bed;with call bell/phone within reach (EOB)   Time: 9758-8325 OT Time Calculation (min): 27 min Charges:  OT General Charges $OT Visit: 1 Procedure OT Evaluation $Initial OT Evaluation Tier I: 1 Procedure OT Treatments $Therapeutic Activity: 8-22 mins G-Codes:    Saharah Sherrow M 12/02/2014, 1:12 PM

## 2014-11-19 NOTE — Progress Notes (Signed)
PT Cancellation Note  Patient Details Name: Emily Key MRN: [id number] DOB: 24-Jun-1971   Cancelled Treatment:    Reason Eval/Treat Not Completed: Patient declined, no reason specified;Other (comment) (Tired and not wanting to get up, just got pain meds).  Retry tomorrow AM.   Ramond Dial 11/19/2014, 5:31 PM   Mee Hives, PT MS Acute Rehab Dept. Number: [facility] [id number] and [place] [phone]

## 2014-11-19 NOTE — Discharge Instructions (Signed)
Thoracoscopy °Care After °Refer to this sheet in the next few weeks. These discharge instructions provide you with general information on caring for yourself after you leave the hospital. Your caregiver may also give you specific instructions. Your treatment has been planned according to the most current medical practices available, but unavoidable complications sometimes occur. If you have any problems or questions after discharge, call your caregiver. °HOME CARE INSTRUCTIONS  °· Remove the bandage (dressing) over your chest tube site as directed by your caregiver. °· It is normal to be sore for a couple weeks following surgery. See your caregiver if this seems to be getting worse rather than better. °· Only take over-the-counter or prescription medicines for pain, discomfort, or fever as directed by your caregiver. It is very important to take pain medicine when you need it so that you will cough and breathe deeply enough to clear mucus (phlegm) and expand your lungs. °· If it hurts to cough, hold a pillow against your chest when you cough. This may help with the discomfort. In spite of the discomfort, cough frequently, as this helps protect against getting an infection in your lung (pneumonia). °· Taking deep breaths keeps lungs inflated and protects against pneumonia. Most patients will go home with an incentive spirometer that encourages deep breathing. °· You may resume a normal diet and activities as directed. °· Use showers for bathing until you see your caregiver, or as instructed. °· Change dressings if necessary or as directed. °· Avoid lifting or driving until you are instructed otherwise. °· Make an appointment to see your caregiver for stitch (suture) or staple removal when instructed. °· Do not travel by airplane for 2 weeks after the chest tube is removed. °SEEK MEDICAL CARE IF:  °· You are bleeding from your wounds. °· You have redness, swelling, or increasing pain in the wounds. °· Your heartbeat  feels irregular or very fast. °· There is pus coming from your wounds. °· There is a bad smell coming from the wound or dressing. °SEEK IMMEDIATE MEDICAL CARE IF:  °· You have a fever. °· You develop a rash. °· You have difficulty breathing. °· You develop any reaction or side effects to medicines given. °· You develop lightheadedness or feel faint. °· You develop shortness of breath or chest pain. °MAKE SURE YOU:  °· Understand these instructions. °· Will watch your condition. °· Will get help right away if you are not doing well or get worse. °Document Released: 01/14/2005 Document Revised: 09/19/2011 Document Reviewed: 12/15/2010 °ExitCare® Patient Information ©2015 [employer]. This information is not intended to replace advice given to you by your health care provider. Make sure you discuss any questions you have with your health care provider. ° °

## 2014-11-19 NOTE — Progress Notes (Signed)
ANTIBIOTIC CONSULT NOTE - FOLLOW UP  Pharmacy Consult for Vancomycin and Zosyn Indication: pneumonia with empyema  No Known Allergies  Patient Measurements: Height: 5\' 8"  (172.7 cm) Weight: 198 lb 6.4 oz (89.994 kg) IBW/kg (Calculated) : 63.9  Vital Signs: Temp: 97.7 F (36.5 C) (05/11 0801) Temp Source: Oral (05/11 0801) BP: 189/71 mmHg (05/11 1000) Pulse Rate: 67 (05/11 1000) Intake/Output from previous day: 05/10 0701 - 05/11 0700 In: 1210 [P.O.:360; IV Piggyback:750] Out: 300 [Urine:300] Intake/Output from this shift:    Labs:  Recent Labs  11/17/14 0515 11/18/14 0418 11/19/14 0605  WBC 15.9* 14.0* 10.9*  HGB 12.2 11.4* 10.9*  PLT 527* 510* 484*  CREATININE 0.88 0.98 2.10*   Estimated Creatinine Clearance: 39.7 mL/min (by C-G formula based on Cr of 2.1).  Recent Labs  11/16/14 1612  VANCOTROUGH 16    Assessment: 43yof POD #5 VATS continues on vancomycin and zosyn. CXR today shows significant improvement. Patient is now in acute renal failure with sCr jumping from 0.98 to 2.1, CrCl now 21ml/min. Vancomycin trough was therapeutic on 5/8 but will need to hold for now given renal failure (already received 2 doses today).   5/3 blood x2: negative 5/4 left pleural fluid: negative 5/6 left pleural fluid: negative 5/6 left pleural wound: negative 5/6 AFB: ngtd 5/6 Fungus: ngtd 5/6 left pleural tissue x2>> negative  Goal of Therapy:  Vancomycin trough level 15-20 mcg/ml  Plan:  1) Hold vancomycin for now, reassess creatinine in AM for further dosing 2) Continue zosyn 3.375g IV q8 (4 hour infusion)  Deboraha Sprang 11/19/2014,10:22 AM

## 2014-11-20 LAB — CBC
HCT: 33.1 % — ABNORMAL LOW (ref 36.0–46.0)
Hemoglobin: 10.7 g/dL — ABNORMAL LOW (ref 12.0–15.0)
MCH: 25.6 pg — ABNORMAL LOW (ref 26.0–34.0)
MCHC: 32.3 g/dL (ref 30.0–36.0)
MCV: 79.2 fL (ref 78.0–100.0)
PLATELETS: 449 10*3/uL — AB (ref 150–400)
RBC: 4.18 MIL/uL (ref 3.87–5.11)
RDW: 15.1 % (ref 11.5–15.5)
WBC: 10.9 10*3/uL — ABNORMAL HIGH (ref 4.0–10.5)

## 2014-11-20 LAB — BASIC METABOLIC PANEL
Anion gap: 10 (ref 5–15)
BUN: 18 mg/dL (ref 6–20)
CO2: 25 mmol/L (ref 22–32)
Calcium: 8.6 mg/dL — ABNORMAL LOW (ref 8.9–10.3)
Chloride: 101 mmol/L (ref 101–111)
Creatinine, Ser: 2.46 mg/dL — ABNORMAL HIGH (ref 0.44–1.00)
GFR, EST AFRICAN AMERICAN: 26 mL/min — AB (ref >60)
GFR, EST NON AFRICAN AMERICAN: 23 mL/min — AB (ref >60)
Glucose, Bld: 110 mg/dL — ABNORMAL HIGH (ref 65–99)
POTASSIUM: 3.8 mmol/L (ref 3.5–5.1)
SODIUM: 136 mmol/L (ref 135–145)

## 2014-11-20 LAB — METANEPHRINES, URINE, 24 HOUR
Metaneph Total, Ur: 133 ug/L
Metanephrines, 24H Ur: 426 ug/24 hr — ABNORMAL HIGH (ref 45–290)
NORMETANEPHRINE 24H UR: 1274 ug/(24.h) — AB (ref 82–500)
NORMETANEPHRINE UR: 398 ug/L
Total Volume: 3200

## 2014-11-20 MED ORDER — WHITE PETROLATUM GEL
Status: AC
  Filled 2014-11-20: qty 1

## 2014-11-20 MED ORDER — AMLODIPINE BESYLATE 5 MG PO TABS
5.0000 mg | ORAL_TABLET | Freq: Every day | ORAL | Status: DC
  Administered 2014-11-20: 5 mg via ORAL
  Filled 2014-11-20: qty 2

## 2014-11-20 MED ORDER — CLONIDINE HCL 0.1 MG PO TABS
0.1000 mg | ORAL_TABLET | Freq: Three times a day (TID) | ORAL | Status: DC
  Administered 2014-11-20 (×2): 0.1 mg via ORAL
  Filled 2014-11-20 (×2): qty 1

## 2014-11-20 MED ORDER — HYDRALAZINE HCL 50 MG PO TABS
100.0000 mg | ORAL_TABLET | Freq: Three times a day (TID) | ORAL | Status: DC
  Administered 2014-11-20 – 2014-11-22 (×6): 100 mg via ORAL
  Filled 2014-11-20 (×6): qty 2

## 2014-11-20 MED ORDER — SODIUM CHLORIDE 0.9 % IV SOLN
INTRAVENOUS | Status: DC
  Administered 2014-11-20 (×2): via INTRAVENOUS

## 2014-11-20 MED ORDER — AMLODIPINE BESYLATE 10 MG PO TABS
10.0000 mg | ORAL_TABLET | Freq: Every day | ORAL | Status: DC
  Administered 2014-11-21 – 2014-11-22 (×2): 10 mg via ORAL
  Filled 2014-11-20 (×2): qty 1

## 2014-11-20 MED ORDER — LABETALOL HCL 200 MG PO TABS
300.0000 mg | ORAL_TABLET | Freq: Two times a day (BID) | ORAL | Status: DC
  Administered 2014-11-20 – 2014-11-22 (×4): 300 mg via ORAL
  Filled 2014-11-20 (×8): qty 1

## 2014-11-20 NOTE — Progress Notes (Signed)
6 Days Post-Op Procedure(s) (LRB): LEFT VIDEO ASSISTED THORACOSCOPY (VATS)/DECORTICATION (Left) DRAINAGE OF LEFT PLEURAL EFFUSION (Left) Subjective: Some incisional pain  Objective: Vital signs in last 24 hours: Temp:  [97.7 F (36.5 C)-98.6 F (37 C)] 97.9 F (36.6 C) (05/12 0750) Pulse Rate:  [67-84] 68 (05/12 0750) Cardiac Rhythm:  [-] Normal sinus rhythm (05/12 0846) Resp:  [18-23] 18 (05/12 0750) BP: (138-189)/(62-82) 159/69 mmHg (05/12 0750) SpO2:  [90 %-100 %] 99 % (05/12 0750) Weight:  [197 lb 1.6 oz (89.404 kg)] 197 lb 1.6 oz (89.404 kg) (05/12 0507)  Hemodynamic parameters for last 24 hours:    Intake/Output from previous day: 05/11 0701 - 05/12 0700 In: 1968.8 [P.O.:960; I.V.:958.8; IV Piggyback:50] Out: -  Intake/Output this shift: Total I/O In: 3 [I.V.:3] Out: -   General appearance: alert and no distress Neurologic: intact Heart: regular rate and rhythm Lungs: diminished breath sounds left base- mild Wound: clean and dry  Lab Results:  Recent Labs  11/19/14 0605 11/20/14 0350  WBC 10.9* 10.9*  HGB 10.9* 10.7*  HCT 34.3* 33.1*  PLT 484* 449*   BMET:  Recent Labs  11/19/14 1600 11/20/14 0350  NA 134* 136  K 3.5 3.8  CL 97* 101  CO2 25 25  GLUCOSE 98 110*  BUN 15 18  CREATININE 2.46* 2.46*  CALCIUM 8.9 8.6*    PT/INR: No results for input(s): LABPROT, INR in the last 72 hours. ABG    Component Value Date/Time   PHART 7.395 11/15/2014 0428   HCO3 28.5* 11/15/2014 0428   TCO2 30 11/15/2014 0428   O2SAT 97.0 11/15/2014 0428   CBG (last 3)  No results for input(s): GLUCAP in the last 72 hours.  Assessment/Plan: S/P Procedure(s) (LRB): LEFT VIDEO ASSISTED THORACOSCOPY (VATS)/DECORTICATION (Left) DRAINAGE OF LEFT PLEURAL EFFUSION (Left) -  Afebrile- vanco discontinued. Zosyn changed to augmentin PO  Creatinine levelled off overnight- hopefully will begin to decrease over next day or two  Hypertension- per medical team   LOS: 9  days    Melrose Nakayama 11/20/2014

## 2014-11-20 NOTE — Care Management Note (Signed)
Case Management Note  Patient Details  Name: Emily Key MRN: [id number] Date of Birth: 1971/04/03  Subjective/Objective:  CM did speak with pt in ref to PCP.                   Action/Plan: CM did set up an appointment with the [facility] for hospital f/u. CM will continue to monitor for disposition needs.    Expected Discharge Date:  11/14/14               Expected Discharge Plan:     In-House Referral:     Discharge planning Services  CM Consult  Post Acute Care Choice:    Choice offered to:     DME Arranged:    DME Agency:     HH Arranged:    HH Agency:     Status of Service:  In process, will continue to follow  Medicare Important Message Given:    Date Medicare IM Given:    Medicare IM give by:    Date Additional Medicare IM Given:    Additional Medicare Important Message give by:     If discussed at [place] of Stay Meetings, dates discussed:    Additional Comments:  Bethena Roys, RN 11/20/2014, 2:26 PM

## 2014-11-20 NOTE — Progress Notes (Signed)
Pt very tearful about not being able to make her sons graduation. Emotional support given, will continue to monitor. Etta Quill, RN

## 2014-11-20 NOTE — Evaluation (Signed)
Physical Therapy Evaluation & Discharge Patient Details Name: Emily Key MRN: [id number] DOB: 1971/03/10 Today's Date: 11/20/2014   History of Present Illness  This 44 y.o. female adittemd with Lt sided pleuritic chest pain.  X-ray showed PNA.  Pt underwent VATS on 5/6 for drainage of empyema. SHe had remained stable from surgical standpoint, but experiencing uncontrolled HTN requiring Cardene drip which was weaned 5/9.  PMH HTN   Clinical Impression  Patient mobilizing independent in the room.   General weakness and pain from surgery main limitations, but functions well despite.  Will have assist to negotiate steps for home entry. Have educated patient on activity progression.  No further skilled PT needs at this time.      Follow Up Recommendations No PT follow up    Equipment Recommendations  None recommended by PT    Recommendations for Other Services       Precautions / Restrictions Precautions Precautions: None      Mobility  Bed Mobility Overal bed mobility: Modified Independent                Transfers Overall transfer level: Modified independent Equipment used: None                Ambulation/Gait Ambulation/Gait assistance: Independent Ambulation Distance (Feet): 250 Feet Assistive device: None Gait Pattern/deviations: Decreased stride length        Stairs Stairs: Yes Stairs assistance: Supervision Stair Management: One rail Right;Forwards;Step to pattern;Alternating pattern Number of Stairs: 3 General stair comments: alternating to ascend, step to to descend; assist for safety  Wheelchair Mobility    Modified Rankin (Stroke Patients Only)       Balance Overall balance assessment: No apparent balance deficits (not formally assessed)                                           Pertinent Vitals/Pain Pain Assessment: 0-10 Pain Score: 4  Pain Location: left side incision Pain Intervention(s): Monitored during  session    Home Living Family/patient expects to be discharged to:: Private residence Living Arrangements: Children Available Help at Discharge: Family;Available PRN/intermittently Type of Home: Apartment Home Access: Stairs to enter Entrance Stairs-Rails: Psychiatric nurse of Steps: 1 flight  Home Layout: One level Home Equipment: None      Prior Function Level of Independence: Independent         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      Hand Dominance   Dominant Hand: Right    Extremity/Trunk Assessment               Lower Extremity Assessment: Overall WFL for tasks assessed         Communication   Communication: No difficulties  Cognition Arousal/Alertness: Awake/alert Behavior During Therapy: WFL for tasks assessed/performed Overall Cognitive Status: Within Functional Limits for tasks assessed                      General Comments      Exercises        Assessment/Plan    PT Assessment Patent does not need any further PT services  PT Diagnosis Generalized weakness   PT Problem List    PT Treatment Interventions     PT Goals (Current goals can be found in the Care Plan section) Acute Rehab PT Goals PT Goal Formulation: All assessment and education complete,  DC therapy    Frequency     Barriers to discharge        Co-evaluation               End of Session Equipment Utilized During Treatment: Gait belt Activity Tolerance: Patient tolerated treatment well Patient left: in bed;with call bell/phone within reach           Time: 1020-1035 PT Time Calculation (min) (ACUTE ONLY): 15 min   Charges:   PT Evaluation $Initial PT Evaluation Tier I: 1 Procedure     PT G Codes:        WYNN,CYNDI 12/17/14, 10:51 AM  Magda Kiel, [place] 2014-12-17

## 2014-11-20 NOTE — Progress Notes (Signed)
[facility]                                                                              Patient Demographics  Emily Key, is a 44 y.o. female, DOB - Mar 30, 1971, PZW:[id number]  Admit date - 11/11/2014   Admitting Physician Rise Patience, MD  Outpatient Primary MD for the patient is No PCP Per Patient  LOS - 9   Chief Complaint  Patient presents with  . Chest Pain       Brief HPI   Patient is a 44 year old female with hypertension, had not been on antihypertensives due to financial reasons presented to ED with left-sided pleuritic chest pain. Patient reported that her symptoms started with nonproductive cough 5 days prior to admission and in the last 3 days she was having left-sided chest pain on deep inspiration. She also reported some diaphoresis. Chest x-ray showed left-sided pneumonia and patient was admitted for further workup. CT of the chest showed large pleural effusion with extensive consolidation. Pulmonary was consulted but unable to do therapeutic thoracentesis due to loculated pleural effusion. CT surgery performed VATS on 5/6. Patient has been stable from CT surgery standpoint, having issues with uncontrolled hypertension. She was placed on Cardene drip on 5/9, now weaned off   Assessment & Plan    Principal Problem:   CAP (community acquired pneumonia)/extensive left-sided consolidation with loculated pleural effusion, abscess s/p VATS 5/6 - CT of the chest showed no pulmonary embolism, however large left pleural effusion occupying 70% of the left hemithorax with passive atelectasis but no obstructive mass lesion is identified. Pulmonary consulted, patient underwent thoracentesis, only 300 mL of cloudy yellow pleural fluid was obtained and studies sent, consistent with empyema, WBCs 1493, 75% neutrophils.   - CT surgery consulted, underwent VATS on 5/6 - switch to oral Augmentin for a month.  - Management per cardiothoracic surgery   Active  Problems: Leukocytosis: Likely due to #1 - Afebrile, continue Augmentin   Uncontrolled hypertension: Patient had not been taking any medications due to financial reasons, was on 3 antihypertensives, last taken in September of last year: Improving, off Cardene drip -Discontinued lisinopril, HCTZ due to acute kidney injury. Increased hydralazine, continue labetalol, added clonidine tid. Patient's clonidine patch had fallen off.  - renal duplex not done  - TSH normal, renin aldosterone ratio normal, metanephrines pending, UA negative for proteinuria -2-D echo with EF of 50-55%, small pericardial effusion, no valvular disorders   Acute kidney injury -Creatinine currently stable although when up from yesterday, discontinued his HCTZ, lisinopril. Waiting for renal ultrasound Doppler - Continue gentle hydration, recheck BMET in a.m.    Pleuritic chest pain - Continue Tylenol scheduled, added Dilaudid as needed, for breakthrough pain continue oxycodone  Code Status:Full code  Family Communication: Discussed in detail with the patient, all imaging results, lab results explained to the patient    Disposition Plan: Not medically ready, hopefully tomorrow if stable  Time Spent in minutes   25 minutes  Procedures  Thoracentesis CT chest   Consults   Pulmonology CT surgery   DVT Prophylaxis   Lovenox   Medications  Scheduled Meds: .  acetaminophen  1,000 mg Oral TID  . amoxicillin-clavulanate  1 tablet Oral Q12H  . bisacodyl  10 mg Oral Daily  . cloNIDine  0.1 mg Oral TID  . enoxaparin (LOVENOX) injection  40 mg Subcutaneous Q24H  . feeding supplement (ENSURE ENLIVE)  237 mL Oral BID BM  . hydrALAZINE  100 mg Oral 3 times per day  . labetalol  200 mg Oral BID  . pantoprazole  40 mg Oral Q0600  . polyethylene glycol  17 g Oral Daily  . senna-docusate  1 tablet Oral QHS  . sodium chloride  10-40 mL Intracatheter Q12H  . sodium chloride  3 mL Intravenous Q12H   Continuous  Infusions: . sodium chloride Stopped (11/20/14 0416)  . sodium chloride 75 mL/hr at 11/20/14 0859   PRN Meds:.acetaminophen, albuterol, hydrALAZINE, HYDROmorphone (DILAUDID) injection, labetalol, ondansetron (ZOFRAN) IV, oxyCODONE, sodium chloride, sodium chloride, traMADol   Antibiotics   Anti-infectives    Start     Dose/Rate Route Frequency Ordered Stop   11/20/14 1000  amoxicillin-clavulanate (AUGMENTIN) 875-125 MG per tablet 1 tablet     1 tablet Oral Every 12 hours 11/19/14 1344     11/14/14 1100  vancomycin (VANCOCIN) IVPB 1000 mg/200 mL premix  Status:  Discontinued     1,000 mg 200 mL/hr over 60 Minutes Intravenous To [facility] 11/13/14 1235 11/14/14 1708   11/13/14 1600  piperacillin-tazobactam (ZOSYN) IVPB 3.375 g     3.375 g 12.5 mL/hr over 240 Minutes Intravenous Every 8 hours 11/13/14 1518 11/19/14 2359   11/13/14 1600  vancomycin (VANCOCIN) IVPB 1000 mg/200 mL premix  Status:  Discontinued     1,000 mg 200 mL/hr over 60 Minutes Intravenous Every 8 hours 11/13/14 1518 11/19/14 1031   11/12/14 2200  azithromycin (ZITHROMAX) tablet 500 mg  Status:  Discontinued     500 mg Oral Daily at bedtime 11/12/14 1045 11/13/14 1453   11/12/14 2000  cefTRIAXone (ROCEPHIN) 1 g in dextrose 5 % 50 mL IVPB - Premix  Status:  Discontinued     1 g 100 mL/hr over 30 Minutes Intravenous Every 24 hours 11/11/14 2142 11/13/14 1453   11/11/14 2200  azithromycin (ZITHROMAX) 500 mg in dextrose 5 % 250 mL IVPB  Status:  Discontinued     500 mg 250 mL/hr over 60 Minutes Intravenous Every 24 hours 11/11/14 2142 11/12/14 1045   11/11/14 1930  azithromycin (ZITHROMAX) 500 mg in dextrose 5 % 250 mL IVPB  Status:  Discontinued     500 mg 250 mL/hr over 60 Minutes Intravenous  Once 11/11/14 1925 11/11/14 2256   11/11/14 1930  cefTRIAXone (ROCEPHIN) 1 g in dextrose 5 % 50 mL IVPB     1 g 100 mL/hr over 30 Minutes Intravenous  Once 11/11/14 1925 11/11/14 2042        Subjective:   Emily Key was seen and examined today. BP is now improving, still complaining of pleuritic chest pain but improving. Afebrile. Patient denies any abdominal pain, N/V/D/C, new weakness, numbess, tingling.   Objective:   Blood pressure 159/69, pulse 92, temperature 97.9 F (36.6 C), temperature source Oral, resp. rate 18, height 5\' 8"  (1.727 m), weight 89.404 kg (197 lb 1.6 oz), last menstrual period 11/11/2014, SpO2 99 %.  Wt Readings from Last 3 Encounters:  11/20/14 89.404 kg (197 lb 1.6 oz)  04/03/14 89.812 kg (198 lb)  10/14/13 90.266 kg (199 lb)     Intake/Output Summary (Last 24 hours) at 11/20/14 1133 Last data  filed at 11/20/14 0900  Gross per 24 hour  Intake 1971.75 ml  Output      0 ml  Net 1971.75 ml    Exam  General: Alert and oriented x 3, NAD  HEENT:  PERRLA, EOMI  Neck: Supple, no JVD, no masses  CVS: S1 S2 clear, RRR  Respiratory: CTAB  Abdomen: Soft, NT, ND, NBS  Ext: no c/c/e  Neuro: no new focal neurological deficits  Skin: No rashes  Psych: Normal affect and demeanor, alert and oriented 3   Data Review   Micro Results Recent Results (from the past 240 hour(s))  Blood culture (routine x 2)     Status: None   Collection Time: 11/11/14  7:58 PM  Result Value Ref Range Status   Specimen Description BLOOD RIGHT WRIST  Final   Special Requests BOTTLES DRAWN AEROBIC AND ANAEROBIC 5CC   Final   Culture   Final    NO GROWTH 5 DAYS Performed at [facility]    Report Status 11/18/2014 FINAL  Final  Blood culture (routine x 2)     Status: None   Collection Time: 11/11/14  8:01 PM  Result Value Ref Range Status   Specimen Description BLOOD LEFT WRIST  Final   Special Requests BOTTLES DRAWN AEROBIC AND ANAEROBIC 4CC  Final   Culture   Final    NO GROWTH 5 DAYS Performed at [facility]    Report Status 11/18/2014 FINAL  Final  AFB culture with smear     Status: None (Preliminary result)   Collection Time: 11/12/14 11:44 AM    Result Value Ref Range Status   Specimen Description FLUID LEFT PLEURAL  Final   Special Requests Normal  Final   Acid Fast Smear   Final    NO ACID FAST BACILLI SEEN Performed at [facility]    Culture   Final    CULTURE WILL BE EXAMINED FOR 6 WEEKS BEFORE ISSUING A FINAL REPORT Performed at [facility]    Report Status PENDING  Incomplete  Body fluid culture     Status: None   Collection Time: 11/12/14 11:44 AM  Result Value Ref Range Status   Specimen Description FLUID LEFT PLEURAL  Final   Special Requests Normal  Final   Gram Stain   Final    RARE WBC PRESENT, PREDOMINANTLY PMN NO ORGANISMS SEEN Performed at [facility]    Culture   Final    NO GROWTH 3 DAYS Performed at [facility]    Report Status 11/16/2014 FINAL  Final  Surgical pcr screen     Status: None   Collection Time: 11/14/14  5:37 AM  Result Value Ref Range Status   MRSA, PCR NEGATIVE NEGATIVE Final   Staphylococcus aureus NEGATIVE NEGATIVE Final    Comment:        The Xpert SA Assay (FDA approved for NASAL specimens in patients over 48 years of age), is one component of a comprehensive surveillance program.  Test performance has been validated by [facility] for patients greater than or equal to 79 year old. It is not intended to diagnose infection nor to guide or monitor treatment.   Fungus Culture with Smear     Status: None (Preliminary result)   Collection Time: 11/14/14 12:25 PM  Result Value Ref Range Status   Specimen Description FLUID PLEURAL  Final   Special Requests POF VANCY ZOSYN PART B  Final   Fungal Smear  Final    NO YEAST OR FUNGAL ELEMENTS SEEN Performed at [facility]    Culture   Final    CULTURE IN PROGRESS FOR FOUR WEEKS Performed at [facility]    Report Status PENDING  Incomplete  Body fluid culture     Status: None   Collection Time: 11/14/14 12:25 PM  Result Value Ref Range Status   Specimen Description  FLUID PLEURAL  Final   Special Requests POF VANCY ZOSYN PART B  Final   Gram Stain   Final    NO WBC SEEN NO ORGANISMS SEEN Performed at [facility]    Culture   Final    NO GROWTH 3 DAYS Performed at [facility]    Report Status 11/18/2014 FINAL  Final  AFB culture with smear     Status: None (Preliminary result)   Collection Time: 11/14/14 12:25 PM  Result Value Ref Range Status   Specimen Description FLUID PLEURAL  Final   Special Requests POF VANCY ZOSYN PART B  Final   Acid Fast Smear   Final    NO ACID FAST BACILLI SEEN Performed at [facility]    Culture   Final    CULTURE WILL BE EXAMINED FOR 6 WEEKS BEFORE ISSUING A FINAL REPORT Performed at [facility]    Report Status PENDING  Incomplete  Fungus Culture with Smear     Status: None (Preliminary result)   Collection Time: 11/14/14 12:28 PM  Result Value Ref Range Status   Specimen Description TISSUE PLEURAL  Final   Special Requests POF VANCY ZOSYN PART C  Final   Fungal Smear   Final    NO YEAST OR FUNGAL ELEMENTS SEEN Performed at [facility]    Culture   Final    CULTURE IN PROGRESS FOR FOUR WEEKS Performed at [facility]    Report Status PENDING  Incomplete  Tissue culture     Status: None   Collection Time: 11/14/14 12:28 PM  Result Value Ref Range Status   Specimen Description TISSUE PLEURAL  Final   Special Requests POF VANCY ZOSYN PART C  Final   Gram Stain   Final    NO WBC SEEN NO SQUAMOUS EPITHELIAL CELLS SEEN NO ORGANISMS SEEN Performed at [facility]    Culture   Final    NO GROWTH 3 DAYS Performed at [facility]    Report Status 11/18/2014 FINAL  Final  AFB culture with smear     Status: None (Preliminary result)   Collection Time: 11/14/14 12:28 PM  Result Value Ref Range Status   Specimen Description TISSUE PLEURAL  Final   Special Requests POF VANCY ZOSYN PART C  Final   Acid Fast Smear   Final    NO ACID  FAST BACILLI SEEN Performed at [facility]    Culture   Final    CULTURE WILL BE EXAMINED FOR 6 WEEKS BEFORE ISSUING A FINAL REPORT Performed at [facility]    Report Status PENDING  Incomplete  Anaerobic culture     Status: None   Collection Time: 11/14/14 12:46 PM  Result Value Ref Range Status   Specimen Description WOUND LEFT PLEURAL  Final   Special Requests POF VANCY ZOSYN PART D  Final   Gram Stain   Final    FEW WBC PRESENT,BOTH PMN AND MONONUCLEAR NO SQUAMOUS EPITHELIAL CELLS SEEN NO ORGANISMS SEEN Performed at [facility]  Culture   Final    NO ANAEROBES ISOLATED Performed at [facility]    Report Status 11/19/2014 FINAL  Final  Wound culture     Status: None   Collection Time: 11/14/14 12:46 PM  Result Value Ref Range Status   Specimen Description WOUND LEFT PLEURAL  Final   Special Requests POF VANCY ZOSYN PART D  Final   Gram Stain   Final    FEW WBC PRESENT,BOTH PMN AND MONONUCLEAR NO SQUAMOUS EPITHELIAL CELLS SEEN NO ORGANISMS SEEN Performed at [facility]    Culture   Final    NO GROWTH 2 DAYS Performed at [facility]    Report Status 11/17/2014 FINAL  Final  Fungus Culture with Smear     Status: None (Preliminary result)   Collection Time: 11/14/14 12:54 PM  Result Value Ref Range Status   Specimen Description TISSUE LEFT PLEURAL  Final   Special Requests POF VANCY ZOSYN PART E  Final   Fungal Smear   Final    NO YEAST OR FUNGAL ELEMENTS SEEN Performed at [facility]    Culture   Final    CULTURE IN PROGRESS FOR FOUR WEEKS Performed at [facility]    Report Status PENDING  Incomplete  Tissue culture     Status: None   Collection Time: 11/14/14 12:54 PM  Result Value Ref Range Status   Specimen Description TISSUE LEFT PLEURAL  Final   Special Requests POF VANCY ZOSYN PART E  Final   Gram Stain   Final    RARE WBC PRESENT, PREDOMINANTLY MONONUCLEAR NO ORGANISMS  SEEN Performed at [facility]    Culture   Final    NO GROWTH 3 DAYS Performed at [facility]    Report Status 11/18/2014 FINAL  Final  AFB culture with smear     Status: None (Preliminary result)   Collection Time: 11/14/14 12:54 PM  Result Value Ref Range Status   Specimen Description TISSUE LEFT PLEURAL  Final   Special Requests POF VANCY ZOSYN PART E  Final   Acid Fast Smear   Final    NO ACID FAST BACILLI SEEN Performed at [facility]    Culture   Final    CULTURE WILL BE EXAMINED FOR 6 WEEKS BEFORE ISSUING A FINAL REPORT Performed at [facility]    Report Status PENDING  Incomplete    Radiology Reports Dg Chest 2 View  11/19/2014   CLINICAL DATA:  Left-sided empyema. Chest discomfort. Empyema J86.9 (ICD-10-CM)  EXAM: CHEST  2 VIEW  COMPARISON:  11/18/2014  FINDINGS: Left inferior hemi thorax chest tube has been removed since the previous day's study. There is no pneumothorax. Left basilar opacity appears improved. Small mild of opacity at the left apex is consistent with atelectasis or scarring.  There is mild subsegmental atelectasis at the medial right lung base. No new areas of lung consolidation.  The residual pleural fluid.  Cardiac silhouette is mildly enlarged. No mediastinal or hilar masses.  Right IJ central venous line is stable from the previous day's study.  IMPRESSION: 1. Status post removal of the left-sided chest tube since the prior exam. No pneumothorax. 2. Significant improvement. Mild residual left basilar opacity may all be atelectasis. There is no residual left pleural effusion. No new abnormalities.   Electronically Signed   By: Lajean Manes M.D.   On: 11/19/2014 08:39   Dg Chest 2 View  11/11/2014   CLINICAL  DATA:  Left-sided chest pain and shortness of breath for 4 days  EXAM: CHEST  2 VIEW  COMPARISON:  June 21, 2013  FINDINGS: There is extensive consolidation throughout the left lower lobe. There is questionable  infiltrate in the lingula as well. Right lung is clear. Heart is mildly enlarged with pulmonary vascularity within normal limits. No adenopathy.  IMPRESSION: Extensive left lower lobe airspace consolidation. There may also be consolidation in a portion of the lingula. Right lung is clear. No change in cardiac prominence.   Electronically Signed   By: Lowella Grip III M.D.   On: 11/11/2014 15:48   Ct Angio Chest Pe W/cm &/or Wo Cm  11/12/2014   CLINICAL DATA:  Chest pain and short of breath.  EXAM: CT ANGIOGRAPHY CHEST WITH CONTRAST  TECHNIQUE: Multidetector CT imaging of the chest was performed using the standard protocol during bolus administration of intravenous contrast. Multiplanar CT image reconstructions and MIPs were obtained to evaluate the vascular anatomy.  CONTRAST:  183mL OMNIPAQUE IOHEXOL 350 MG/ML SOLN  COMPARISON:  Plain film 11/11/2014, CT abdomen 10/14/2013  FINDINGS: Mediastinum/Nodes: There is no filling defects within the proximal main pulmonary arteries or the interlobar pulmonary arteries. Evaluation of the lower lobe segmental pulmonary arteries is degraded by patient respiratory motion.  No acute findings of the aorta great vessels. No pericardial fluid. Left ventricle appears mildly enlarged. No mediastinal lymphadenopathy.  Lungs/Pleura: There is large left pleural effusion which occupies 70% of the left hemi thorax volume. There is atelectasis of the left upper lobe and left lower lobe associated with these large pleural effusion. There is no clear obstructing mass lesion obstructing the bronchus. The lung parenchyma itself is difficult to evaluate. The right lung is clear without evidence of fusion. There is mild atelectasis the right lung base.  Upper abdomen: Limited view of the liver, kidneys, pancreas are unremarkable. Normal adrenal glands.  No aggressive osseous lesion  Musculoskeletal: No aggressive osseous lesion  Review of the MIP images confirms the above findings.   IMPRESSION: 1. No evidence of acute pulmonary embolism within the main pulmonary arteries or intralobar pulmonary arteries. The lower lobe pulmonary arteries are poorly evaluated due to patient respiratory motion. 2. Large left pleural effusion occupying 70% of the left hemi thorax volume. There is passive atelectasis of the left lower lobe and left upper lobe. No obstructing mass lesion is identified.   Electronically Signed   By: Suzy Bouchard M.D.   On: 11/12/2014 01:08   Dg Chest Port 1 View  11/18/2014   CLINICAL DATA:  Left VA TS and drainage of left effusion, followup  EXAM: PORTABLE CHEST - 1 VIEW  COMPARISON:  Portable chest x-ray of 11/17/2014  FINDINGS: The left chest tubes have been removed. Matched that 1 of the 2 left chest tubes has been removed. No pneumothorax is seen. Haziness remains at the left lung base. The right lung is clear. Right IJ central venous line is unchanged in position.  IMPRESSION: One of 2 left chest tubes removed.  No pneumothorax.   Electronically Signed   By: Ivar Drape M.D.   On: 11/18/2014 08:01   Dg Chest Port 1 View  11/17/2014   CLINICAL DATA:  Vats.  EXAM: PORTABLE CHEST - 1 VIEW  COMPARISON:  11/16/2014.  11/15/2014.  FINDINGS: Interim removal of lateral most chest tube. Remaining 2 chest tubes in stable position. Right IJ line stable position. Stable cardiomegaly with normal pulmonary vascularity. Persistent left base atelectasis and/or infiltrate.  IMPRESSION: 1. Interim removal of lateral most left chest tube. Remaining 2 chest tubes in stable position. No pneumothorax. 2. Persistent atelectasis and/or infiltrate left lower lobe. 3. Stable cardiomegaly   Electronically Signed   By: Marcello Moores  Register   On: 11/17/2014 07:52   Dg Chest Port 1 View  11/16/2014   CLINICAL DATA:  Status post left VATS with decortication and drainage of left pleural fluid.  EXAM: PORTABLE CHEST - 1 VIEW  COMPARISON:  Yesterday.  FINDINGS: Three left chest tubes remain in place with  no pneumothorax seen. Right jugular catheter tip in the superior vena cava. Stable enlarged cardiac silhouette and left basilar opacity. Unremarkable bones.  IMPRESSION: 1. Three left chest tubes without pneumothorax. 2. Stable cardiomegaly and left basilar atelectasis, pneumonia, pulmonary contusion or re-expansion edema.   Electronically Signed   By: Claudie Revering M.D.   On: 11/16/2014 08:37   Dg Chest Port 1 View  11/15/2014   CLINICAL DATA:  Chest tubes.  Dyspnea.  EXAM: PORTABLE CHEST - 1 VIEW  COMPARISON:  11/14/2014  FINDINGS: There are 3 left chest tubes. There is a right jugular central line extending into the SVC. There is no pneumothorax. There is left base consolidation. The right lung is clear.  IMPRESSION: Chest tubes appear unchanged in position. No pneumothorax. Unchanged left base consolidation.   Electronically Signed   By: Andreas Newport M.D.   On: 11/15/2014 06:20   Dg Chest Port 1 View  11/14/2014   CLINICAL DATA:  Empyema status post left lung decortication  EXAM: PORTABLE CHEST - 1 VIEW  COMPARISON:  11/12/2014  FINDINGS: Postsurgical changes in the left hemithorax. Two left apical chest tubes. Left basilar drain.  Small left pleural effusion, improved.  No pneumothorax is seen.  Right lung is clear.  Cardiomegaly.  Right IJ venous catheter terminates cavoatrial junction.  IMPRESSION: Postsurgical changes in the left hemithorax.  Small left pleural effusion, improved.  No pneumothorax is seen.  Two left apical chest tubes and left basilar drain.   Electronically Signed   By: Julian Hy M.D.   On: 11/14/2014 15:50   Dg Chest Port 1 View  11/12/2014   CLINICAL DATA:  Post thoracentesis.  EXAM: PORTABLE CHEST - 1 VIEW  COMPARISON:  11/11/2014  FINDINGS: Large left pleural effusion with left lower lobe atelectasis or infiltrate. No pneumothorax following left thoracentesis. No scratch head minimal right base opacity, likely atelectasis. Cardiomegaly. No acute bony abnormality.   IMPRESSION: Large left pleural effusion with left lower lobe atelectasis or infiltrate. No pneumothorax.   Electronically Signed   By: Rolm Baptise M.D.   On: 11/12/2014 12:27    CBC  Recent Labs Lab 11/16/14 0430 11/17/14 0515 11/18/14 0418 11/19/14 0605 11/20/14 0350  WBC 21.7* 15.9* 14.0* 10.9* 10.9*  HGB 11.8* 12.2 11.4* 10.9* 10.7*  HCT 36.6 37.3 35.5* 34.3* 33.1*  PLT 508* 527* 510* 484* 449*  MCV 80.4 79.4 79.2 79.4 79.2  MCH 25.9* 26.0 25.4* 25.2* 25.6*  MCHC 32.2 32.7 32.1 31.8 32.3  RDW 14.8 14.6 14.8 15.0 15.1    Chemistries   Recent Labs Lab 11/13/14 1434  11/16/14 0430 11/17/14 0515 11/18/14 0418 11/19/14 0605 11/19/14 1600 11/20/14 0350  NA 135  < > 134* 135 134* 134* 134* 136  K 2.9*  < > 3.2* 4.1 3.6 3.6 3.5 3.8  CL 96*  < > 96* 96* 96* 96* 97* 101  CO2 31  < > 29 30 29 28 25  25  GLUCOSE 126*  < > 122* 114* 118* 98 98 110*  BUN 14  < > 8 8 9 13 15 18   CREATININE 0.85  < > 0.74 0.88 0.98 2.10* 2.46* 2.46*  CALCIUM 8.9  < > 8.8* 9.0 8.8* 8.8* 8.9 8.6*  AST 14*  --  14*  --   --   --   --   --   ALT 9*  --  10*  --   --   --   --   --   ALKPHOS 86  --  68  --   --   --   --   --   BILITOT 0.7  --  0.7  --   --   --   --   --   < > = values in this interval not displayed. ------------------------------------------------------------------------------------------------------------------ estimated creatinine clearance is 33.8 mL/min (by C-G formula based on Cr of 2.46). ------------------------------------------------------------------------------------------------------------------ No results for input(s): HGBA1C in the last 72 hours. ------------------------------------------------------------------------------------------------------------------ No results for input(s): CHOL, HDL, LDLCALC, TRIG, CHOLHDL, LDLDIRECT in the last 72 hours. ------------------------------------------------------------------------------------------------------------------ No  results for input(s): TSH, T4TOTAL, T3FREE, THYROIDAB in the last 72 hours.  Invalid input(s): FREET3 ------------------------------------------------------------------------------------------------------------------ No results for input(s): VITAMINB12, FOLATE, FERRITIN, TIBC, IRON, RETICCTPCT in the last 72 hours.  Coagulation profile  Recent Labs Lab 11/13/14 1434  INR 1.21    No results for input(s): DDIMER in the last 72 hours.  Cardiac Enzymes No results for input(s): CKMB, TROPONINI, MYOGLOBIN in the last 168 hours.  Invalid input(s): CK ------------------------------------------------------------------------------------------------------------------ Invalid input(s): POCBNP  No results for input(s): GLUCAP in the last 72 hours.   RAI,RIPUDEEP M.D. [facility] 11/20/2014, 11:33 AM  Pager: [phone]   Between 7am to 7pm - call Pager - [phone]  After 7pm go to [email or web address] - password [email or web address]  Call night coverage person covering after 7pm

## 2014-11-21 LAB — BASIC METABOLIC PANEL
ANION GAP: 10 (ref 5–15)
BUN: 13 mg/dL (ref 6–20)
CO2: 26 mmol/L (ref 22–32)
Calcium: 8.9 mg/dL (ref 8.9–10.3)
Chloride: 100 mmol/L — ABNORMAL LOW (ref 101–111)
Creatinine, Ser: 2.2 mg/dL — ABNORMAL HIGH (ref 0.44–1.00)
GFR calc Af Amer: 30 mL/min — ABNORMAL LOW (ref >60)
GFR calc non Af Amer: 26 mL/min — ABNORMAL LOW (ref >60)
Glucose, Bld: 102 mg/dL — ABNORMAL HIGH (ref 65–99)
POTASSIUM: 3.7 mmol/L (ref 3.5–5.1)
SODIUM: 136 mmol/L (ref 135–145)

## 2014-11-21 MED ORDER — CLONIDINE HCL 0.2 MG PO TABS
0.2000 mg | ORAL_TABLET | Freq: Three times a day (TID) | ORAL | Status: DC
  Administered 2014-11-21 – 2014-11-22 (×4): 0.2 mg via ORAL
  Filled 2014-11-21 (×4): qty 1

## 2014-11-21 NOTE — Consult Note (Signed)
[facility] Renal Consultation Note  Requesting MD: [facility] / Dr. Estill Cotta Indication for Consultation: evaluation for possible pheochromocytoma  HPI:  Emily Key is a 44 y.o. female who was admitted on 11/11/14 with complaints of Left-sided chest pain, presenting history with non-productive cough x 5 days with subsequent Left pleuritic chest pain x 3 days, associated with diaphoresis. In ED, identified to have extensive Left LL PNA, elevated WBC 18, elevated D-dimer 2.2, had Chest CTA w/o acute PE but identified large left pleural effusion with secondary atx, no masses identified. Started on CTX and Azithro for CAP and admitted. Additionally, on admission patient's BP significantly elevated to SBP 180-205 and DBP 98-112.  During hospitalization, PCCM consulted for thoracentesis on 5/4, with 300cc exudative pleural fluid and neutrophil predominance. Switched to [employer] / Zosyn on 5/5. Consulted TCTS for left VATS to drain large loculated Left exudative parapneumonic effusion, performed VATS on 5/6, removed 2L pleural fluid, multiple loculated lung abscess. Vanc / Zosyn stopped 5/11, transitioned to Augmentin PO.  Renal consulted for evaluation of possible pheochromocytoma on 5/13. Patient with chronic h/o HTN since age around age 32. Chronic history of non-adherence to prolonged course anti-HTN meds, would take for several months then be off for months to year (due to cost). Recently was up to 3 anti-HTN meds last took in August 2015 (since out), occasionally has taken BP outside office up to 200/100s. Currently has remained significantly HTN daily since admission, as above BP up to 205/112 on admit, started on HCTZ 12.54m > 261m Lisinopril 21m27m 76m50metoprolol 50mg41m and Hydralazine IV PRN regularly, titrated up over first few days. Clonidine added on 5/7. Cardene gtt started 5/9 to 5/10, added Hydralazine PO. Work-up for secondary etiology of HTN with nml TSH, nml renin:aldo ratio, UA  bland w/o proteinuria, Renal Artery Duplex (limited by incision site, but no obvious RA Stenosis identified). Significant results with elevated 24 hr urine metanephrines (Nmet 1274, dx range >900. Met 426, dx range >400) and Plasma catecholamines (Norepi 2153, Epi 322, Dopamine 150 - above ref range). Plasma metanephrines (pending). Also, note last Abdominal CT w/ contrast 10/2013 with normal adrenal glands and no identified abd masses. - Admits to occasional episodes of HA, sweating, last episode 2 weeks ago at night. But denies any recurrent episodes during hospitalization. - Family history with Mother HTN since age 30s. 25sknown family h/o pheo or other similar tumors.  CREATININE, SER  Date/Time Value Ref Range Status  11/21/2014 04:45 AM 2.20* 0.44 - 1.00 mg/dL Final  11/20/2014 03:50 AM 2.46* 0.44 - 1.00 mg/dL Final  11/19/2014 04:00 PM 2.46* 0.44 - 1.00 mg/dL Final  11/19/2014 06:05 AM 2.10* 0.44 - 1.00 mg/dL Final    Comment:    REPEATED TO VERIFY DELTA CHECK NOTED   11/18/2014 04:18 AM 0.98 0.44 - 1.00 mg/dL Final  11/17/2014 05:15 AM 0.88 0.44 - 1.00 mg/dL Final  11/16/2014 04:30 AM 0.74 0.44 - 1.00 mg/dL Final  11/15/2014 04:30 AM 0.70 0.44 - 1.00 mg/dL Final  11/14/2014 04:26 AM 0.90 0.44 - 1.00 mg/dL Final  11/13/2014 02:34 PM 0.85 0.44 - 1.00 mg/dL Final  11/13/2014 03:43 AM 0.76 0.44 - 1.00 mg/dL Final  11/12/2014 07:19 AM 0.88 0.44 - 1.00 mg/dL Final  11/11/2014 10:42 PM 0.94 0.44 - 1.00 mg/dL Final  11/11/2014 04:35 PM 0.88 0.44 - 1.00 mg/dL Final  04/03/2014 04:39 PM 1.09 0.50 - 1.10 mg/dL Final  10/14/2013 04:09 PM 1.05 0.50 - 1.10 mg/dL Final  06/21/2013 04:41 PM 1.04 0.50 - 1.10 mg/dL Final  01/24/2010 12:14 PM 0.68 0.4 - 1.2 mg/dL Final  01/05/2010 10:33 PM 0.94 0.4 - 1.2 mg/dL Final  01/05/2010 08:01 PM 1.0 0.4 - 1.2 mg/dL Final  12/31/2009 08:10 PM 0.88 0.4 - 1.2 mg/dL Final  12/30/2009 12:58 PM 0.68 0.4 - 1.2 mg/dL Final  10/02/2007 05:00 PM 0.73  Final      PMHx:   Past Medical History  Diagnosis Date  . Coronary artery disease   . Hypertension   . CHF (congestive heart failure)     after last pregnancy    Past Surgical History  Procedure Laterality Date  . Tonsillectomy    . Tubal ligation    . Video assisted thoracoscopy (vats)/decortication Left 11/14/2014    Procedure: LEFT VIDEO ASSISTED THORACOSCOPY (VATS)/DECORTICATION;  Surgeon: Melrose Nakayama, MD;  Location: [facility];  Service: Thoracic;  Laterality: Left;  . Pleural effusion drainage Left 11/14/2014    Procedure: DRAINAGE OF LEFT PLEURAL EFFUSION;  Surgeon: Melrose Nakayama, MD;  Location: [facility] OR;  Service: Thoracic;  Laterality: Left;    Family Hx:  Family History  Problem Relation Age of Onset  . Diabetes Mellitus II Mother   . Hypertension Mother   . Lung cancer Father     Social History:  reports that she has been smoking Cigarettes.  She has been smoking about 0.25 packs per day. She does not have any smokeless tobacco history on file. She reports that she does not drink alcohol or use illicit drugs.  Allergies: No Known Allergies  Medications: Prior to Admission medications   Medication Sig Start Date End Date Taking? Authorizing Provider  naproxen sodium (ANAPROX) 220 MG tablet Take 440 mg by mouth daily as needed (for pain).   Yes Historical Provider, MD  hydrochlorothiazide (HYDRODIURIL) 12.5 MG tablet Take 1 tablet (12.5 mg total) by mouth once. 04/03/14   Sol Passer, MD  lisinopril (PRINIVIL,ZESTRIL) 20 MG tablet Take 1 tablet (20 mg total) by mouth once. 04/03/14   Sol Passer, MD  metoprolol (LOPRESSOR) 25 MG tablet Take 1 tablet (25 mg total) by mouth 2 (two) times daily. 04/03/14   Sol Passer, MD    I have reviewed the patient's current medications.  Labs:  Results for orders placed or performed during the hospital encounter of 11/11/14 (from the past 48 hour(s))  Basic metabolic panel     Status: Abnormal   Collection Time: 11/19/14   4:00 PM  Result Value Ref Range   Sodium 134 (L) 135 - 145 mmol/L   Potassium 3.5 3.5 - 5.1 mmol/L   Chloride 97 (L) 101 - 111 mmol/L   CO2 25 22 - 32 mmol/L   Glucose, Bld 98 70 - 99 mg/dL   BUN 15 6 - 20 mg/dL   Creatinine, Ser 2.46 (H) 0.44 - 1.00 mg/dL   Calcium 8.9 8.9 - 10.3 mg/dL   GFR calc non Af Amer 23 (L) >60 mL/min   GFR calc Af Amer 26 (L) >60 mL/min    Comment: (NOTE) The eGFR has been calculated using the CKD EPI equation. This calculation has not been validated in all clinical situations. eGFR's persistently <60 mL/min signify possible Chronic Kidney Disease.    Anion gap 12 5 - 15  Basic metabolic panel     Status: Abnormal   Collection Time: 11/20/14  3:50 AM  Result Value Ref Range   Sodium 136 135 - 145 mmol/L   Potassium 3.8 3.5 -  5.1 mmol/L   Chloride 101 101 - 111 mmol/L   CO2 25 22 - 32 mmol/L   Glucose, Bld 110 (H) 65 - 99 mg/dL   BUN 18 6 - 20 mg/dL   Creatinine, Ser 2.46 (H) 0.44 - 1.00 mg/dL   Calcium 8.6 (L) 8.9 - 10.3 mg/dL   GFR calc non Af Amer 23 (L) >60 mL/min   GFR calc Af Amer 26 (L) >60 mL/min    Comment: (NOTE) The eGFR has been calculated using the CKD EPI equation. This calculation has not been validated in all clinical situations. eGFR's persistently <60 mL/min signify possible Chronic Kidney Disease.    Anion gap 10 5 - 15  CBC     Status: Abnormal   Collection Time: 11/20/14  3:50 AM  Result Value Ref Range   WBC 10.9 (H) 4.0 - 10.5 K/uL   RBC 4.18 3.87 - 5.11 MIL/uL   Hemoglobin 10.7 (L) 12.0 - 15.0 g/dL   HCT 33.1 (L) 36.0 - 46.0 %   MCV 79.2 78.0 - 100.0 fL   MCH 25.6 (L) 26.0 - 34.0 pg   MCHC 32.3 30.0 - 36.0 g/dL   RDW 15.1 11.5 - 15.5 %   Platelets 449 (H) 150 - 400 K/uL  Basic metabolic panel     Status: Abnormal   Collection Time: 11/21/14  4:45 AM  Result Value Ref Range   Sodium 136 135 - 145 mmol/L   Potassium 3.7 3.5 - 5.1 mmol/L   Chloride 100 (L) 101 - 111 mmol/L   CO2 26 22 - 32 mmol/L   Glucose, Bld  102 (H) 65 - 99 mg/dL   BUN 13 6 - 20 mg/dL   Creatinine, Ser 2.20 (H) 0.44 - 1.00 mg/dL   Calcium 8.9 8.9 - 10.3 mg/dL   GFR calc non Af Amer 26 (L) >60 mL/min   GFR calc Af Amer 30 (L) >60 mL/min    Comment: (NOTE) The eGFR has been calculated using the CKD EPI equation. This calculation has not been validated in all clinical situations. eGFR's persistently <60 mL/min signify possible Chronic Kidney Disease.    Anion gap 10 5 - 15     ROS:  Pertinent items are noted in HPI.  Physical Exam: Filed Vitals:   11/21/14 0748  BP: 185/65  Pulse: 72  Temp: 98.4 F (36.9 C)  Resp: 20   Gen - 43 yr AAF, well-appearing, pleasant and cooperative, NAD HEENT - NCAT non-tender, PERRL, EOMI, moist MM Neck - supple, non-tender, no thyromegaly Heart - RRR, no murmurs heard Lungs - CTAB, no wheezing, crackles, or rhonchi. Non-labored. Abd - soft, NTND, no masses, +active BS Ext - non-tender, no edema, peripheral pulses intact +2 b/l Skin - warm, dry, no rashes Neuro - awake, alert, oriented, grossly non-foca  Background: 44 yr female with PMH HTN (uncontrolled since age 70), tobacco abuse, 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 Admitted on 5/3 with worsening dyspnea and L-chest pain, found to have LLL CAP with complicated parapneumonic effusion. S/p thoracentesis with exudative effusion (5/4), consulted TCTS proceeded to Left VATS (5/6) for removal of loculated lung abscess and effusion. Antibiotic coverage for CAP switched to Vanc / Zosyn and transitioned to PO Augmentin on 5/11.  Renal consulted on 5/13 for evaluation of possible pheochromocytoma in setting of uncontrolled HTN with significantly elevated 24 hr urine metanephrines and plasma catecholamines. Additional secondary work-up negative with RA Duplex (negative for stenosis), nml TSH, nml renin:aldo. Note prior Abd CT w/ contrast  10/2013 (no adrenal masses)  Assessment/Plan: 1. 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 Elevated urine metanephrines and plasma catecholamines are diagnostically consistent. However, plasma metanephrines (initial test) are still pending - called lab and apparently result is waiting to be finalized may be ready today vs tomorrow - Will wait on result prior to pursuing next step.  1. Clinically seems possible but still less likely to be this long and undiagnosed with refractory HTN x 20 years, however HTN at young age about 55, episodic headaches some episodes with diaphoresis (none in hospital, >2 weeks ago). BP still sub-optimally controlled in hospital >1 week on multiple meds, means cannot simply dismiss as non-adherence.  2. Regardless of plasma metanephrine result, would not pursue advanced imaging inpatient with AKI and further contrast vs gadolinium exposure. This is chronic issue and if BP stabilized can discharge and follow-up outpatient to potentially repeat urine studies (no longer acute illness / post-op) and could pursue imaging at that time if needed, Adrenal CT vs MRI. 3. Anticipate patient may need change to anti-HTN at follow-up. Consider titration Clonidine, Hydralazine, vs adding Minoxidil 2. AKI / in setting of ACEi / nephrotoxins 1. Baseline SCR 0.9, initially normal on admission until 5/11 elevated SCr to 2.10 seems to have peaked at 2.46, down to 2.20 today with IVF hydration.  This could be affecting blood pressure as well 2. Likely due to multiple nephrotoxins such as ACEi, HCTZ, initially Toradol x 3 days, Vanc IV, contrast. No episodes of hypotension, and HTN somewhat improved less likely to be poor perfusion. 3. Trend SCr daily. KVO IVF. Continue PO 4. Hold ACEi/ARB on discharge. Resume outpatient once SCr normalized. 3. LLL CAP, complicated with abscess and parapneumonic effusion - S/p thoracentesis (5/4), VATS (5/6) 1. Per primary / TCTS. Completed  course Vanc / Zosyn (5/5 to 5/11). On Augmentin PO 4. H/o Tobacco Abuse 5. Dispo - Potentially near discharge from medical standpoint after CAP and s/p VATS, on PO abx. BP improved to stable (remains HTN). From Renal standpoint,  discharge tomorrow is possible, to follow-up outpatient. If plasma metanephrines elevated would pursue imaging once AKI resolved. If not elevated, would repeat plasma / urine metanephrines in few weeks once recovered from acute illness / surgery.  Nobie Putnam, [place], PGY-2 (Currently working with [facility] - Renal Service) 11/21/2014, 11:24 AM   Patient seen and examined, agree with above note with above modifications. Interesting nice 44 year old BF with history of HTN from a young age.  Admit at this time with pleural effusion eventually requiring VATS c/b AKI.  Work up for secondary causes of HTn reveal elevated urine metanephrines and catecholamines which could be consistent with secreting tumor however, not entirely sure how to interpret these numbers in the setting of acute illness and surgery.  Given that her blood pressure is reasonable although not ideal at this time- could consider OP follow up with repeat labs, awaiting resolution of AKI to really see if this diagnosis is valid- did have abdominal imaging a year ago which was normal  Corliss Parish, MD 11/21/2014

## 2014-11-21 NOTE — Progress Notes (Addendum)
[facility]                                                                              Patient Demographics  Emily Key, is a 44 y.o. female, DOB - 08/16/1970, YOV:[id number]  Admit date - 11/11/2014   Admitting Physician Rise Patience, MD  Outpatient Primary MD for the patient is No PCP Per Patient  LOS - 10   Chief Complaint  Patient presents with  . Chest Pain       Brief HPI   Patient is a 44 year old female with hypertension, had not been on antihypertensives due to financial reasons presented to ED with left-sided pleuritic chest pain. Patient reported that her symptoms started with nonproductive cough 5 days prior to admission and in the last 3 days she was having left-sided chest pain on deep inspiration. She also reported some diaphoresis. Chest x-ray showed left-sided pneumonia and patient was admitted for further workup. CT of the chest showed large pleural effusion with extensive consolidation. Pulmonary was consulted but unable to do therapeutic thoracentesis due to loculated pleural effusion. CT surgery performed VATS on 5/6. Patient has been stable from CT surgery standpoint, having issues with uncontrolled hypertension. She was placed on Cardene drip on 5/9, now weaned off   Assessment & Plan    Principal Problem:   CAP (community acquired pneumonia)/extensive left-sided consolidation with loculated pleural effusion, abscess s/p VATS 5/6 - CT of the chest showed no pulmonary embolism, however large left pleural effusion occupying 70% of the left hemithorax with passive atelectasis but no obstructive mass lesion is identified. Pulmonary consulted, patient underwent thoracentesis, only 300 mL of cloudy yellow pleural fluid was obtained and studies sent, consistent with empyema, WBCs 1493, 75% neutrophils.   - CT surgery consulted, underwent VATS on 5/6 - switched to oral Augmentin for a month, tissue cultures has been negative.  - Management  per cardiothoracic surgery, she has an appointment with Dr. Roxan Hockey scheduled   Active Problems: Leukocytosis: Likely due to #1 - Afebrile, continue Augmentin   Uncontrolled hypertension: ? Pheochromocytoma  Patient had not been taking any medications due to financial reasons, was on 3 antihypertensives, last taken in September of last year - Patient was placed on Cardene drip in ICU which has been weaned off on 5/ 10. She was initially placed on lisinopril, HCTZ, beta blocker, hydralazine, clonidine however BP has still remained mostly uncontrolled. - 2-D echo showed normal EF with small pericardial effusion, no valvular disorders. Renal duplex showed no significant renal artery stenosis - Discontinued lisinopril, HCTZ due to acute kidney injury. Increased hydralazine, continue labetalol, added clonidine tid. Patient's clonidine patch had fallen off.  - renal duplex showed no significant renal artery stenosis - TSH normal, renin aldosterone ratio normal,  plasma metanephrines are pending, however fractionated catecholamines including norepinephrine, epinephrine and dopamine are all high (preliminary results came today). 24-hour urine normetanephrine and metanephrines are also significantly elevated. - Unfortunately due to renal function, cannot do CT of the abdomen and pelvis or MRI with contrast. Interestingly patient had a CT angiogram of the chest on 5/4 which showed normal adrenals. Question if MIBG scan  as patient has biochemical evidence of pheochromocytoma . Discussed in detail with Dr. Rolan Lipa, recommended to hold off on starting phenoxybenzamine or prazosin for now until renal evaluation.   Acute kidney injury - Creatinine improving, cont to hold HCTZ, lisinopril.     Pleuritic chest pain - Continue Tylenol scheduled, added Dilaudid as needed, for breakthrough pain continue oxycodone  Code Status:Full code  Family Communication: Discussed in detail with the patient, all  imaging results, lab results explained to the patient   Disposition Plan: Not medically ready  Time Spent in minutes   25 minutes  Procedures  Thoracentesis CT chest   Consults   Pulmonology CT surgery   DVT Prophylaxis   Lovenox   Medications  Scheduled Meds: . acetaminophen  1,000 mg Oral TID  . amLODipine  10 mg Oral Daily  . amoxicillin-clavulanate  1 tablet Oral Q12H  . bisacodyl  10 mg Oral Daily  . cloNIDine  0.2 mg Oral TID  . enoxaparin (LOVENOX) injection  40 mg Subcutaneous Q24H  . feeding supplement (ENSURE ENLIVE)  237 mL Oral BID BM  . hydrALAZINE  100 mg Oral 3 times per day  . labetalol  300 mg Oral BID  . pantoprazole  40 mg Oral Q0600  . polyethylene glycol  17 g Oral Daily  . senna-docusate  1 tablet Oral QHS  . sodium chloride  10-40 mL Intracatheter Q12H  . sodium chloride  3 mL Intravenous Q12H   Continuous Infusions: . sodium chloride Stopped (11/20/14 0416)  . sodium chloride 75 mL/hr at 11/20/14 2320   PRN Meds:.acetaminophen, albuterol, hydrALAZINE, HYDROmorphone (DILAUDID) injection, labetalol, ondansetron (ZOFRAN) IV, oxyCODONE, sodium chloride, sodium chloride, traMADol   Antibiotics   Anti-infectives    Start     Dose/Rate Route Frequency Ordered Stop   11/20/14 1000  amoxicillin-clavulanate (AUGMENTIN) 875-125 MG per tablet 1 tablet     1 tablet Oral Every 12 hours 11/19/14 1344     11/14/14 1100  vancomycin (VANCOCIN) IVPB 1000 mg/200 mL premix  Status:  Discontinued     1,000 mg 200 mL/hr over 60 Minutes Intravenous To [facility] 11/13/14 1235 11/14/14 1708   11/13/14 1600  piperacillin-tazobactam (ZOSYN) IVPB 3.375 g     3.375 g 12.5 mL/hr over 240 Minutes Intravenous Every 8 hours 11/13/14 1518 11/19/14 2359   11/13/14 1600  vancomycin (VANCOCIN) IVPB 1000 mg/200 mL premix  Status:  Discontinued     1,000 mg 200 mL/hr over 60 Minutes Intravenous Every 8 hours 11/13/14 1518 11/19/14 1031   11/12/14 2200  azithromycin  (ZITHROMAX) tablet 500 mg  Status:  Discontinued     500 mg Oral Daily at bedtime 11/12/14 1045 11/13/14 1453   11/12/14 2000  cefTRIAXone (ROCEPHIN) 1 g in dextrose 5 % 50 mL IVPB - Premix  Status:  Discontinued     1 g 100 mL/hr over 30 Minutes Intravenous Every 24 hours 11/11/14 2142 11/13/14 1453   11/11/14 2200  azithromycin (ZITHROMAX) 500 mg in dextrose 5 % 250 mL IVPB  Status:  Discontinued     500 mg 250 mL/hr over 60 Minutes Intravenous Every 24 hours 11/11/14 2142 11/12/14 1045   11/11/14 1930  azithromycin (ZITHROMAX) 500 mg in dextrose 5 % 250 mL IVPB  Status:  Discontinued     500 mg 250 mL/hr over 60 Minutes Intravenous  Once 11/11/14 1925 11/11/14 2256   11/11/14 1930  cefTRIAXone (ROCEPHIN) 1 g in dextrose 5 % 50 mL IVPB  1 g 100 mL/hr over 30 Minutes Intravenous  Once 11/11/14 1925 11/11/14 2042        Subjective:   Paetyn Pietrzak was seen and examined today. BP is now improving, still complaining of pleuritic chest pain but improving. Afebrile. Patient denies any abdominal pain, N/V/D/C, new weakness, numbess, tingling.   Objective:   Blood pressure 185/65, pulse 72, temperature 98.4 F (36.9 C), temperature source Oral, resp. rate 20, height 5\' 8"  (1.727 m), weight 89.132 kg (196 lb 8 oz), last menstrual period 11/11/2014, SpO2 95 %.  Wt Readings from Last 3 Encounters:  11/21/14 89.132 kg (196 lb 8 oz)  04/03/14 89.812 kg (198 lb)  10/14/13 90.266 kg (199 lb)     Intake/Output Summary (Last 24 hours) at 11/21/14 1001 Last data filed at 11/21/14 0748  Gross per 24 hour  Intake    600 ml  Output      0 ml  Net    600 ml    Exam  General: Alert and oriented x 3, NAD  HEENT:  PERRLA, EOMI  Neck: Supple, no JVD, no masses  CVS: S1 S2 clear, RRR  Respiratory: Clear to auscultation bilaterally  Abdomen: Soft, NT, ND, NBS  Ext: no c/c/e  Neuro: no new focal neurological deficits  Skin: No rashes  Psych: Normal affect and demeanor, alert  and oriented 3   Data Review   Micro Results Recent Results (from the past 240 hour(s))  Blood culture (routine x 2)     Status: None   Collection Time: 11/11/14  7:58 PM  Result Value Ref Range Status   Specimen Description BLOOD RIGHT WRIST  Final   Special Requests BOTTLES DRAWN AEROBIC AND ANAEROBIC 5CC   Final   Culture   Final    NO GROWTH 5 DAYS Performed at [facility]    Report Status 11/18/2014 FINAL  Final  Blood culture (routine x 2)     Status: None   Collection Time: 11/11/14  8:01 PM  Result Value Ref Range Status   Specimen Description BLOOD LEFT WRIST  Final   Special Requests BOTTLES DRAWN AEROBIC AND ANAEROBIC 4CC  Final   Culture   Final    NO GROWTH 5 DAYS Performed at [facility]    Report Status 11/18/2014 FINAL  Final  AFB culture with smear     Status: None (Preliminary result)   Collection Time: 11/12/14 11:44 AM  Result Value Ref Range Status   Specimen Description FLUID LEFT PLEURAL  Final   Special Requests Normal  Final   Acid Fast Smear   Final    NO ACID FAST BACILLI SEEN Performed at [facility]    Culture   Final    CULTURE WILL BE EXAMINED FOR 6 WEEKS BEFORE ISSUING A FINAL REPORT Performed at [facility]    Report Status PENDING  Incomplete  Body fluid culture     Status: None   Collection Time: 11/12/14 11:44 AM  Result Value Ref Range Status   Specimen Description FLUID LEFT PLEURAL  Final   Special Requests Normal  Final   Gram Stain   Final    RARE WBC PRESENT, PREDOMINANTLY PMN NO ORGANISMS SEEN Performed at [facility]    Culture   Final    NO GROWTH 3 DAYS Performed at [facility]    Report Status 11/16/2014 FINAL  Final  Surgical pcr screen     Status: None   Collection  Time: 11/14/14  5:37 AM  Result Value Ref Range Status   MRSA, PCR NEGATIVE NEGATIVE Final   Staphylococcus aureus NEGATIVE NEGATIVE Final    Comment:        The Xpert SA Assay (FDA approved  for NASAL specimens in patients over 12 years of age), is one component of a comprehensive surveillance program.  Test performance has been validated by [facility] for patients greater than or equal to 36 year old. It is not intended to diagnose infection nor to guide or monitor treatment.   Fungus Culture with Smear     Status: None (Preliminary result)   Collection Time: 11/14/14 12:25 PM  Result Value Ref Range Status   Specimen Description FLUID PLEURAL  Final   Special Requests POF VANCY ZOSYN PART B  Final   Fungal Smear   Final    NO YEAST OR FUNGAL ELEMENTS SEEN Performed at [facility]    Culture   Final    CULTURE IN PROGRESS FOR FOUR WEEKS Performed at [facility]    Report Status PENDING  Incomplete  Body fluid culture     Status: None   Collection Time: 11/14/14 12:25 PM  Result Value Ref Range Status   Specimen Description FLUID PLEURAL  Final   Special Requests POF VANCY ZOSYN PART B  Final   Gram Stain   Final    NO WBC SEEN NO ORGANISMS SEEN Performed at [facility]    Culture   Final    NO GROWTH 3 DAYS Performed at [facility]    Report Status 11/18/2014 FINAL  Final  AFB culture with smear     Status: None (Preliminary result)   Collection Time: 11/14/14 12:25 PM  Result Value Ref Range Status   Specimen Description FLUID PLEURAL  Final   Special Requests POF VANCY ZOSYN PART B  Final   Acid Fast Smear   Final    NO ACID FAST BACILLI SEEN Performed at [facility]    Culture   Final    CULTURE WILL BE EXAMINED FOR 6 WEEKS BEFORE ISSUING A FINAL REPORT Performed at [facility]    Report Status PENDING  Incomplete  Fungus Culture with Smear     Status: None (Preliminary result)   Collection Time: 11/14/14 12:28 PM  Result Value Ref Range Status   Specimen Description TISSUE PLEURAL  Final   Special Requests POF VANCY ZOSYN PART C  Final   Fungal Smear   Final    NO YEAST OR FUNGAL  ELEMENTS SEEN Performed at [facility]    Culture   Final    CULTURE IN PROGRESS FOR FOUR WEEKS Performed at [facility]    Report Status PENDING  Incomplete  Tissue culture     Status: None   Collection Time: 11/14/14 12:28 PM  Result Value Ref Range Status   Specimen Description TISSUE PLEURAL  Final   Special Requests POF VANCY ZOSYN PART C  Final   Gram Stain   Final    NO WBC SEEN NO SQUAMOUS EPITHELIAL CELLS SEEN NO ORGANISMS SEEN Performed at [facility]    Culture   Final    NO GROWTH 3 DAYS Performed at [facility]    Report Status 11/18/2014 FINAL  Final  AFB culture with smear     Status: None (Preliminary result)   Collection Time: 11/14/14 12:28 PM  Result Value Ref Range Status  Specimen Description TISSUE PLEURAL  Final   Special Requests POF VANCY ZOSYN PART C  Final   Acid Fast Smear   Final    NO ACID FAST BACILLI SEEN Performed at [facility]    Culture   Final    CULTURE WILL BE EXAMINED FOR 6 WEEKS BEFORE ISSUING A FINAL REPORT Performed at [facility]    Report Status PENDING  Incomplete  Anaerobic culture     Status: None   Collection Time: 11/14/14 12:46 PM  Result Value Ref Range Status   Specimen Description WOUND LEFT PLEURAL  Final   Special Requests POF VANCY ZOSYN PART D  Final   Gram Stain   Final    FEW WBC PRESENT,BOTH PMN AND MONONUCLEAR NO SQUAMOUS EPITHELIAL CELLS SEEN NO ORGANISMS SEEN Performed at [facility]    Culture   Final    NO ANAEROBES ISOLATED Performed at [facility]    Report Status 11/19/2014 FINAL  Final  Wound culture     Status: None   Collection Time: 11/14/14 12:46 PM  Result Value Ref Range Status   Specimen Description WOUND LEFT PLEURAL  Final   Special Requests POF VANCY ZOSYN PART D  Final   Gram Stain   Final    FEW WBC PRESENT,BOTH PMN AND MONONUCLEAR NO SQUAMOUS EPITHELIAL CELLS SEEN NO ORGANISMS SEEN Performed at  [facility]    Culture   Final    NO GROWTH 2 DAYS Performed at [facility]    Report Status 11/17/2014 FINAL  Final  Fungus Culture with Smear     Status: None (Preliminary result)   Collection Time: 11/14/14 12:54 PM  Result Value Ref Range Status   Specimen Description TISSUE LEFT PLEURAL  Final   Special Requests POF VANCY ZOSYN PART E  Final   Fungal Smear   Final    NO YEAST OR FUNGAL ELEMENTS SEEN Performed at [facility]    Culture   Final    CULTURE IN PROGRESS FOR FOUR WEEKS Performed at [facility]    Report Status PENDING  Incomplete  Tissue culture     Status: None   Collection Time: 11/14/14 12:54 PM  Result Value Ref Range Status   Specimen Description TISSUE LEFT PLEURAL  Final   Special Requests POF VANCY ZOSYN PART E  Final   Gram Stain   Final    RARE WBC PRESENT, PREDOMINANTLY MONONUCLEAR NO ORGANISMS SEEN Performed at [facility]    Culture   Final    NO GROWTH 3 DAYS Performed at [facility]    Report Status 11/18/2014 FINAL  Final  AFB culture with smear     Status: None (Preliminary result)   Collection Time: 11/14/14 12:54 PM  Result Value Ref Range Status   Specimen Description TISSUE LEFT PLEURAL  Final   Special Requests POF VANCY ZOSYN PART E  Final   Acid Fast Smear   Final    NO ACID FAST BACILLI SEEN Performed at [facility]    Culture   Final    CULTURE WILL BE EXAMINED FOR 6 WEEKS BEFORE ISSUING A FINAL REPORT Performed at [facility]    Report Status PENDING  Incomplete    Radiology Reports Dg Chest 2 View  11/19/2014   CLINICAL DATA:  Left-sided empyema. Chest discomfort. Empyema J86.9 (ICD-10-CM)  EXAM: CHEST  2 VIEW  COMPARISON:  11/18/2014  FINDINGS: Left inferior hemi  thorax chest tube has been removed since the previous day's study. There is no pneumothorax. Left basilar opacity appears improved. Small mild of opacity at the left apex is consistent  with atelectasis or scarring.  There is mild subsegmental atelectasis at the medial right lung base. No new areas of lung consolidation.  The residual pleural fluid.  Cardiac silhouette is mildly enlarged. No mediastinal or hilar masses.  Right IJ central venous line is stable from the previous day's study.  IMPRESSION: 1. Status post removal of the left-sided chest tube since the prior exam. No pneumothorax. 2. Significant improvement. Mild residual left basilar opacity may all be atelectasis. There is no residual left pleural effusion. No new abnormalities.   Electronically Signed   By: Lajean Manes M.D.   On: 11/19/2014 08:39   Dg Chest 2 View  11/11/2014   CLINICAL DATA:  Left-sided chest pain and shortness of breath for 4 days  EXAM: CHEST  2 VIEW  COMPARISON:  June 21, 2013  FINDINGS: There is extensive consolidation throughout the left lower lobe. There is questionable infiltrate in the lingula as well. Right lung is clear. Heart is mildly enlarged with pulmonary vascularity within normal limits. No adenopathy.  IMPRESSION: Extensive left lower lobe airspace consolidation. There may also be consolidation in a portion of the lingula. Right lung is clear. No change in cardiac prominence.   Electronically Signed   By: Lowella Grip III M.D.   On: 11/11/2014 15:48   Ct Angio Chest Pe W/cm &/or Wo Cm  11/12/2014   CLINICAL DATA:  Chest pain and short of breath.  EXAM: CT ANGIOGRAPHY CHEST WITH CONTRAST  TECHNIQUE: Multidetector CT imaging of the chest was performed using the standard protocol during bolus administration of intravenous contrast. Multiplanar CT image reconstructions and MIPs were obtained to evaluate the vascular anatomy.  CONTRAST:  159mL OMNIPAQUE IOHEXOL 350 MG/ML SOLN  COMPARISON:  Plain film 11/11/2014, CT abdomen 10/14/2013  FINDINGS: Mediastinum/Nodes: There is no filling defects within the proximal main pulmonary arteries or the interlobar pulmonary arteries. Evaluation of the  lower lobe segmental pulmonary arteries is degraded by patient respiratory motion.  No acute findings of the aorta great vessels. No pericardial fluid. Left ventricle appears mildly enlarged. No mediastinal lymphadenopathy.  Lungs/Pleura: There is large left pleural effusion which occupies 70% of the left hemi thorax volume. There is atelectasis of the left upper lobe and left lower lobe associated with these large pleural effusion. There is no clear obstructing mass lesion obstructing the bronchus. The lung parenchyma itself is difficult to evaluate. The right lung is clear without evidence of fusion. There is mild atelectasis the right lung base.  Upper abdomen: Limited view of the liver, kidneys, pancreas are unremarkable. Normal adrenal glands.  No aggressive osseous lesion  Musculoskeletal: No aggressive osseous lesion  Review of the MIP images confirms the above findings.  IMPRESSION: 1. No evidence of acute pulmonary embolism within the main pulmonary arteries or intralobar pulmonary arteries. The lower lobe pulmonary arteries are poorly evaluated due to patient respiratory motion. 2. Large left pleural effusion occupying 70% of the left hemi thorax volume. There is passive atelectasis of the left lower lobe and left upper lobe. No obstructing mass lesion is identified.   Electronically Signed   By: Suzy Bouchard M.D.   On: 11/12/2014 01:08   Dg Chest Port 1 View  11/18/2014   CLINICAL DATA:  Left VA TS and drainage of left effusion, followup  EXAM: PORTABLE CHEST -  1 VIEW  COMPARISON:  Portable chest x-ray of 11/17/2014  FINDINGS: The left chest tubes have been removed. Matched that 1 of the 2 left chest tubes has been removed. No pneumothorax is seen. Haziness remains at the left lung base. The right lung is clear. Right IJ central venous line is unchanged in position.  IMPRESSION: One of 2 left chest tubes removed.  No pneumothorax.   Electronically Signed   By: Ivar Drape M.D.   On: 11/18/2014  08:01   Dg Chest Port 1 View  11/17/2014   CLINICAL DATA:  Vats.  EXAM: PORTABLE CHEST - 1 VIEW  COMPARISON:  11/16/2014.  11/15/2014.  FINDINGS: Interim removal of lateral most chest tube. Remaining 2 chest tubes in stable position. Right IJ line stable position. Stable cardiomegaly with normal pulmonary vascularity. Persistent left base atelectasis and/or infiltrate.  IMPRESSION: 1. Interim removal of lateral most left chest tube. Remaining 2 chest tubes in stable position. No pneumothorax. 2. Persistent atelectasis and/or infiltrate left lower lobe. 3. Stable cardiomegaly   Electronically Signed   By: Marcello Moores  Register   On: 11/17/2014 07:52   Dg Chest Port 1 View  11/16/2014   CLINICAL DATA:  Status post left VATS with decortication and drainage of left pleural fluid.  EXAM: PORTABLE CHEST - 1 VIEW  COMPARISON:  Yesterday.  FINDINGS: Three left chest tubes remain in place with no pneumothorax seen. Right jugular catheter tip in the superior vena cava. Stable enlarged cardiac silhouette and left basilar opacity. Unremarkable bones.  IMPRESSION: 1. Three left chest tubes without pneumothorax. 2. Stable cardiomegaly and left basilar atelectasis, pneumonia, pulmonary contusion or re-expansion edema.   Electronically Signed   By: Claudie Revering M.D.   On: 11/16/2014 08:37   Dg Chest Port 1 View  11/15/2014   CLINICAL DATA:  Chest tubes.  Dyspnea.  EXAM: PORTABLE CHEST - 1 VIEW  COMPARISON:  11/14/2014  FINDINGS: There are 3 left chest tubes. There is a right jugular central line extending into the SVC. There is no pneumothorax. There is left base consolidation. The right lung is clear.  IMPRESSION: Chest tubes appear unchanged in position. No pneumothorax. Unchanged left base consolidation.   Electronically Signed   By: Andreas Newport M.D.   On: 11/15/2014 06:20   Dg Chest Port 1 View  11/14/2014   CLINICAL DATA:  Empyema status post left lung decortication  EXAM: PORTABLE CHEST - 1 VIEW  COMPARISON:   11/12/2014  FINDINGS: Postsurgical changes in the left hemithorax. Two left apical chest tubes. Left basilar drain.  Small left pleural effusion, improved.  No pneumothorax is seen.  Right lung is clear.  Cardiomegaly.  Right IJ venous catheter terminates cavoatrial junction.  IMPRESSION: Postsurgical changes in the left hemithorax.  Small left pleural effusion, improved.  No pneumothorax is seen.  Two left apical chest tubes and left basilar drain.   Electronically Signed   By: Julian Hy M.D.   On: 11/14/2014 15:50   Dg Chest Port 1 View  11/12/2014   CLINICAL DATA:  Post thoracentesis.  EXAM: PORTABLE CHEST - 1 VIEW  COMPARISON:  11/11/2014  FINDINGS: Large left pleural effusion with left lower lobe atelectasis or infiltrate. No pneumothorax following left thoracentesis. No scratch head minimal right base opacity, likely atelectasis. Cardiomegaly. No acute bony abnormality.  IMPRESSION: Large left pleural effusion with left lower lobe atelectasis or infiltrate. No pneumothorax.   Electronically Signed   By: Rolm Baptise M.D.   On: 11/12/2014 12:27  CBC  Recent Labs Lab 11/16/14 0430 11/17/14 0515 11/18/14 0418 11/19/14 0605 11/20/14 0350  WBC 21.7* 15.9* 14.0* 10.9* 10.9*  HGB 11.8* 12.2 11.4* 10.9* 10.7*  HCT 36.6 37.3 35.5* 34.3* 33.1*  PLT 508* 527* 510* 484* 449*  MCV 80.4 79.4 79.2 79.4 79.2  MCH 25.9* 26.0 25.4* 25.2* 25.6*  MCHC 32.2 32.7 32.1 31.8 32.3  RDW 14.8 14.6 14.8 15.0 15.1    Chemistries   Recent Labs Lab 11/16/14 0430  11/18/14 0418 11/19/14 0605 11/19/14 1600 11/20/14 0350 11/21/14 0445  NA 134*  < > 134* 134* 134* 136 136  K 3.2*  < > 3.6 3.6 3.5 3.8 3.7  CL 96*  < > 96* 96* 97* 101 100*  CO2 29  < > 29 28 25 25 26   GLUCOSE 122*  < > 118* 98 98 110* 102*  BUN 8  < > 9 13 15 18 13   CREATININE 0.74  < > 0.98 2.10* 2.46* 2.46* 2.20*  CALCIUM 8.8*  < > 8.8* 8.8* 8.9 8.6* 8.9  AST 14*  --   --   --   --   --   --   ALT 10*  --   --   --   --   --    --   ALKPHOS 68  --   --   --   --   --   --   BILITOT 0.7  --   --   --   --   --   --   < > = values in this interval not displayed. ------------------------------------------------------------------------------------------------------------------ estimated creatinine clearance is 37.7 mL/min (by C-G formula based on Cr of 2.2). ------------------------------------------------------------------------------------------------------------------ No results for input(s): HGBA1C in the last 72 hours. ------------------------------------------------------------------------------------------------------------------ No results for input(s): CHOL, HDL, LDLCALC, TRIG, CHOLHDL, LDLDIRECT in the last 72 hours. ------------------------------------------------------------------------------------------------------------------ No results for input(s): TSH, T4TOTAL, T3FREE, THYROIDAB in the last 72 hours.  Invalid input(s): FREET3 ------------------------------------------------------------------------------------------------------------------ No results for input(s): VITAMINB12, FOLATE, FERRITIN, TIBC, IRON, RETICCTPCT in the last 72 hours.  Coagulation profile No results for input(s): INR, PROTIME in the last 168 hours.  No results for input(s): DDIMER in the last 72 hours.  Cardiac Enzymes No results for input(s): CKMB, TROPONINI, MYOGLOBIN in the last 168 hours.  Invalid input(s): CK ------------------------------------------------------------------------------------------------------------------ Invalid input(s): POCBNP  No results for input(s): GLUCAP in the last 72 hours.   RAI,RIPUDEEP M.D. [facility] 11/21/2014, 10:01 AM  Pager: [phone]   Between 7am to 7pm - call Pager - [phone]  After 7pm go to [email or web address] - password [email or web address]  Call night coverage person covering after 7pm

## 2014-11-21 NOTE — Progress Notes (Signed)
7 Days Post-Op Procedure(s) (LRB): LEFT VIDEO ASSISTED THORACOSCOPY (VATS)/DECORTICATION (Left) DRAINAGE OF LEFT PLEURAL EFFUSION (Left) Subjective: Still having some incisional pain  Objective: Vital signs in last 24 hours: Temp:  [97.9 F (36.6 C)-99.6 F (37.6 C)] 98.4 F (36.9 C) (05/13 0748) Pulse Rate:  [60-92] 72 (05/13 0748) Cardiac Rhythm:  [-] Normal sinus rhythm (05/13 0818) Resp:  [18-20] 20 (05/13 0748) BP: (140-208)/(60-89) 185/65 mmHg (05/13 0748) SpO2:  [95 %-99 %] 95 % (05/13 0748) Weight:  [196 lb 8 oz (89.132 kg)] 196 lb 8 oz (89.132 kg) (05/13 0455)  Hemodynamic parameters for last 24 hours:    Intake/Output from previous day: 05/12 0701 - 05/13 0700 In: 363 [P.O.:360; I.V.:3] Out: -  Intake/Output this shift: Total I/O In: 240 [P.O.:240] Out: -   General appearance: alert and no distress Lungs: mildly diminished left base Wound: clean and dry  Lab Results:  Recent Labs  11/19/14 0605 11/20/14 0350  WBC 10.9* 10.9*  HGB 10.9* 10.7*  HCT 34.3* 33.1*  PLT 484* 449*   BMET:  Recent Labs  11/20/14 0350 11/21/14 0445  NA 136 136  K 3.8 3.7  CL 101 100*  CO2 25 26  GLUCOSE 110* 102*  BUN 18 13  CREATININE 2.46* 2.20*  CALCIUM 8.6* 8.9    PT/INR: No results for input(s): LABPROT, INR in the last 72 hours. ABG    Component Value Date/Time   PHART 7.395 11/15/2014 0428   HCO3 28.5* 11/15/2014 0428   TCO2 30 11/15/2014 0428   O2SAT 97.0 11/15/2014 0428   CBG (last 3)  No results for input(s): GLUCAP in the last 72 hours.  Assessment/Plan: S/P Procedure(s) (LRB): LEFT VIDEO ASSISTED THORACOSCOPY (VATS)/DECORTICATION (Left) DRAINAGE OF LEFT PLEURAL EFFUSION (Left) -  POD # 7 decortication  Remains afebrile- now on Augmentin Path- inflammation Creatinine down a little this AM Hypertension remains problematic- IM managing   LOS: 10 days    Melrose Nakayama 11/21/2014

## 2014-11-22 LAB — BASIC METABOLIC PANEL
Anion gap: 10 (ref 5–15)
BUN: 10 mg/dL (ref 6–20)
CALCIUM: 8.8 mg/dL — AB (ref 8.9–10.3)
CHLORIDE: 100 mmol/L — AB (ref 101–111)
CO2: 26 mmol/L (ref 22–32)
Creatinine, Ser: 1.92 mg/dL — ABNORMAL HIGH (ref 0.44–1.00)
GFR calc Af Amer: 35 mL/min — ABNORMAL LOW (ref >60)
GFR, EST NON AFRICAN AMERICAN: 30 mL/min — AB (ref >60)
Glucose, Bld: 110 mg/dL — ABNORMAL HIGH (ref 65–99)
Potassium: 3.5 mmol/L (ref 3.5–5.1)
Sodium: 136 mmol/L (ref 135–145)

## 2014-11-22 LAB — METANEPHRINES, PLASMA

## 2014-11-22 MED ORDER — HYDRALAZINE HCL 100 MG PO TABS: 100.0000 mg | ORAL_TABLET | Freq: Three times a day (TID) | ORAL | Status: DC

## 2014-11-22 MED ORDER — GUAIFENESIN-DM 100-10 MG/5ML PO SYRP
5.0000 mL | ORAL_SOLUTION | ORAL | Status: DC | PRN
  Administered 2014-11-22: 5 mL via ORAL
  Filled 2014-11-22: qty 5

## 2014-11-22 MED ORDER — LABETALOL HCL 300 MG PO TABS: 300.0000 mg | ORAL_TABLET | Freq: Two times a day (BID) | ORAL | Status: DC

## 2014-11-22 MED ORDER — AMOXICILLIN-POT CLAVULANATE 875-125 MG PO TABS: 1.0000 | ORAL_TABLET | Freq: Two times a day (BID) | ORAL | Status: DC

## 2014-11-22 MED ORDER — AMLODIPINE BESYLATE 10 MG PO TABS: 10.0000 mg | ORAL_TABLET | Freq: Every day | ORAL | Status: DC

## 2014-11-22 MED ORDER — CLONIDINE HCL 0.2 MG PO TABS: 0.2000 mg | ORAL_TABLET | Freq: Three times a day (TID) | ORAL | Status: DC

## 2014-11-22 MED ORDER — OXYCODONE HCL 5 MG PO TABS: 5.0000 mg | ORAL_TABLET | Freq: Four times a day (QID) | ORAL | Status: DC | PRN

## 2014-11-22 NOTE — Progress Notes (Signed)
Subjective:  Feeling fine- blood pressure seems pretty well controlled on her 4 drug regimen- creatinine down Objective Vital signs in last 24 hours: Filed Vitals:   11/22/14 0806 11/22/14 0815 11/22/14 1132 11/22/14 1137  BP: 147/57  143/74 143/74  Pulse:    60  Temp: 98.1 F (36.7 C) 98.1 F (36.7 C)  97.6 F (36.4 C)  TempSrc: Oral Oral  Oral  Resp: 18   20  Height:      Weight:      SpO2: 100%   100%   Weight change: -0.227 kg (-8 oz)  Intake/Output Summary (Last 24 hours) at 11/22/14 1141 Last data filed at 11/22/14 0839  Gross per 24 hour  Intake    720 ml  Output      0 ml  Net    720 ml   Background: 44 yr female with PMH HTN (uncontrolled since age 65), tobacco abuse, 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 Admitted on 5/3 with worsening dyspnea and L-chest pain, found to have LLL CAP/parapneumonic effusion. S/p thoracentesis with exudative effusion (5/4), consulted TCTS proceeded to Left VATS (5/6) for removal of loculated lung abscess and effusion. Antibiotic coverage for CAP switched to Vanc / Zosyn and transitioned to PO Augmentin on 5/11.  Renal consulted on 5/13 for evaluation of possible pheochromocytoma in setting of uncontrolled HTN with significantly elevated 24 hr urine metanephrines and plasma catecholamines. Additional secondary work-up negative with RA Duplex (negative for stenosis), nml TSH, nml renin:aldo. Note prior Abd CT w/ contrast 10/2013 (no adrenal masses)  Assessment/Plan: 1. 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 Elevated urine metanephrines and plasma catecholamines are diagnostically consistent. However, plasma metanephrines (initial test) are still pending - called lab and apparently result is waiting to be finalized may be ready today vs tomorrow  1. Clinically seems possible but still less likely to be this long and  undiagnosed with refractory HTN x 20 years, however HTN at young age about 51, episodic headaches some episodes with diaphoresis (none in hospital, >2 weeks ago).  2. Regardless of plasma metanephrine result, would not pursue advanced imaging inpatient with AKI and further contrast vs gadolinium exposure.  BP seems stabilized at this time so can discharge and follow-up outpatient to potentially repeat urine studies (no longer acute illness / post-op) and could pursue imaging at that time if needed, Adrenal CT vs MRI. I have explained to the patient the dire importance of being compliant with her current blood pressure regimen.  It sounds as if Dr. Tana Coast is trying to get her Key 30 day supply of meds and Key close follow up with [facility].  I will arrange follow up with me as well.  I have encouraged her to get Key blood pressure cuff for home use- it may be covered vs Key 60-70$ investment.    2. AKI / in setting of ACEi / nephrotoxins 1. Baseline SCR 0.9, initially normal on admission until 5/11 elevated SCr to 2.10 seems to have peaked at 2.46, down to 2.20 today with IVF hydration. This could be affecting blood pressure as well 2. Likely due to multiple nephrotoxins such as ACEi, HCTZ, initially Toradol x 3 days, Vanc IV, contrast. No episodes of hypotension, and HTN somewhat improved less likely to be poor perfusion. 3. Trend SCr daily. KVO IVF. Continue PO 4. Hold ACEi/ARB on discharge. Resume outpatient once SCr normalized. 3.  LLL CAP, complicated with abscess and parapneumonic effusion - S/p thoracentesis (5/4), VATS (5/6) 1. Per primary / TCTS. Completed course Vanc / Zosyn (5/5 to 5/11). On Augmentin PO 4. H/o Tobacco Abuse 5. Dispo - From Renal standpoint, discharge today, to follow-up outpatient. If plasma metanephrines elevated would pursue imaging once AKI resolved. If not elevated, would repeat plasma / urine metanephrines in few weeks once recovered from acute illness /  surgery.     Emily Key    Labs: Basic Metabolic Panel:  Recent Labs Lab 11/20/14 0350 11/21/14 0445 11/22/14 0508  NA 136 136 136  K 3.8 3.7 3.5  CL 101 100* 100*  CO2 25 26 26   GLUCOSE 110* 102* 110*  BUN 18 13 10   CREATININE 2.46* 2.20* 1.92*  CALCIUM 8.6* 8.9 8.8*   Liver Function Tests:  Recent Labs Lab 11/16/14 0430  AST 14*  ALT 10*  ALKPHOS 68  BILITOT 0.7  PROT 6.9  ALBUMIN 2.4*   No results for input(s): LIPASE, AMYLASE in the last 168 hours. No results for input(s): AMMONIA in the last 168 hours. CBC:  Recent Labs Lab 11/16/14 0430 11/17/14 0515 11/18/14 0418 11/19/14 0605 11/20/14 0350  WBC 21.7* 15.9* 14.0* 10.9* 10.9*  HGB 11.8* 12.2 11.4* 10.9* 10.7*  HCT 36.6 37.3 35.5* 34.3* 33.1*  MCV 80.4 79.4 79.2 79.4 79.2  PLT 508* 527* 510* 484* 449*   Cardiac Enzymes: No results for input(s): CKTOTAL, CKMB, CKMBINDEX, TROPONINI in the last 168 hours. CBG: No results for input(s): GLUCAP in the last 168 hours.  Iron Studies: No results for input(s): IRON, TIBC, TRANSFERRIN, FERRITIN in the last 72 hours. Studies/Results: No results found. Medications: Infusions:    Scheduled Medications: . acetaminophen  1,000 mg Oral TID  . amLODipine  10 mg Oral Daily  . amoxicillin-clavulanate  1 tablet Oral Q12H  . bisacodyl  10 mg Oral Daily  . cloNIDine  0.2 mg Oral TID  . enoxaparin (LOVENOX) injection  40 mg Subcutaneous Q24H  . feeding supplement (ENSURE ENLIVE)  237 mL Oral BID BM  . hydrALAZINE  100 mg Oral 3 times per day  . labetalol  300 mg Oral BID  . pantoprazole  40 mg Oral Q0600  . polyethylene glycol  17 g Oral Daily  . senna-docusate  1 tablet Oral QHS  . sodium chloride  10-40 mL Intracatheter Q12H  . sodium chloride  3 mL Intravenous Q12H    have reviewed scheduled and prn medications.  Physical Exam: General: NAD Heart: RRR Lungs: mostly clear Abdomen: soft, non tender Extremities:  No  edema  11/22/2014,11:41 AM  LOS: 11 days

## 2014-11-22 NOTE — Care Management Note (Signed)
Case Management Note  Patient Details  Name: Emily Key MRN: [id number] Date of Birth: May 09, 1971  Subjective/Objective:                   1. acute kidney injury in setting of ACE inhibitor as/nephrotoxic medications Action/Plan:  Discharge planning Expected Discharge Date:  11/14/14               Expected Discharge Plan:  Home/Self Care  In-House Referral:     Discharge planning Services  CM Consult, [place] Program, Medication Assistance  Post Acute Care Choice:    Choice offered to:     DME Arranged:    DME Agency:     HH Arranged:    [place] Agency:     Status of Service:  Completed, signed off  Medicare Important Message Given:    Date Medicare IM Given:    Medicare IM give by:    Date Additional Medicare IM Given:    Additional Medicare Important Message give by:     If discussed at [place] of Stay Meetings, dates discussed:    Additional Comments: [place] letter with list of participating pharmacies.  Pt verbalized understanding of all MATCH parameters.  Pt has appt with [facility] 11/26/14 but [facility] not open at this time to help with medications.  No other CM needs were communicated.   Dellie Catholic, RN 11/22/2014, 1:17 PM

## 2014-11-22 NOTE — Progress Notes (Signed)
Pt discharge to home with prescription of 30 days meds. BP stable at baseline. No complain of pain.

## 2014-11-22 NOTE — Discharge Summary (Signed)
Physician Discharge Summary  Emily Key SWN:[id number] DOB: [birth year] DOA: 11/11/2014  PCP: No PCP Per Patient  Admit date: 11/11/2014 Discharge date: 11/22/2014  Time spent: 35 minutes  Recommendations for Outpatient Follow-up:  1. Please follow-up on plasma metanephrines which should be finalized next 24-48 hours. There was concern for pheochromocytoma with history of refractory hypertension. If plasma metanephrines come back elevated would pursue imaging once acute kidney injury is resolved. If levels are not elevated would recommend repeating plasma/urine metanephrines and several weeks after recovery from acute illness. 2. Follow-up on repeat BMP on hospital follow-up visit, patient treated for acute kidney injury in setting of ACE inhibitor as/nephrotoxic medications having creatinine of 1.92 on day of discharge. During hospitalization she was evaluated by nephrology. 3. Consider restarting ACE inhibitor therapy once creatinine normalizes 4. Follow up on patient's blood pressures   Discharge Diagnoses:  Principal Problem:   CAP (community acquired pneumonia) Active Problems:   Hypertensive urgency   Pleuritic chest pain   Pleural effusion   Chest pain   S/P thoracentesis   Empyema of lung   Discharge Condition: Stable/improved  Diet recommendation: Heart healthy  Filed Weights   11/20/14 0507 11/21/14 0455 11/22/14 0400  Weight: 89.404 kg (197 lb 1.6 oz) 89.132 kg (196 lb 8 oz) 88.905 kg (196 lb)    History of present illness:  Emily Key is a 44 y.o. female with history of hypertension who has not been taking medications due to financial reasons presents to the ER because of left-sided chest pain. Patient states her symptoms started with nonproductive cough 5 days ago and last 3 days has been having left-sided chest pain on deep inspiration. Has been having some diaphoresis. In the ER chest x-ray shows features concerning for left-sided pneumonia and has been admitted  for further management. On exam patient is mildly tachypneic and tachycardic. Patient's blood pressure has been also been elevated. Patient denies any recent travel or sick contacts. Denies any nausea vomiting abdominal pain or diarrhea.  Hospital Course:  Patient is a 44 year old female with hypertension, had not been on antihypertensives due to financial reasons presented to ED with left-sided pleuritic chest pain. Patient reported that her symptoms started with nonproductive cough 5 days prior to admission and in the last 3 days she was having left-sided chest pain on deep inspiration. She also reported some diaphoresis. Chest x-ray showed left-sided pneumonia and patient was admitted for further workup. CT of the chest showed large pleural effusion with extensive consolidation. Pulmonary was consulted but unable to do therapeutic thoracentesis due to loculated pleural effusion. CT surgery performed VATS on 5/6. Patient has been stable from CT surgery standpoint, having issues with uncontrolled hypertension. She was placed on Cardene drip on 5/9, now weaned off  CAP (community acquired pneumonia)/extensive left-sided consolidation with loculated pleural effusion, abscess s/p VATS 5/6 - CT of the chest showed no pulmonary embolism, however large left pleural effusion occupying 70% of the left hemithorax with passive atelectasis but no obstructive mass lesion is identified. Pulmonary consulted, patient underwent thoracentesis, only 300 mL of cloudy yellow pleural fluid was obtained and studies sent, consistent with empyema, WBCs 1493, 75% neutrophils.  - CT surgery consulted, underwent VATS on 5/6 -Will discharge on oral Augmentin 875 1 tab PO BID qty # 3 -Appointment with Dr. 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 She was initially placed on lisinopril, HCTZ, beta blocker, hydralazine, clonidine however BP has still remained mostly uncontrolled. - 2-D echo showed normal EF with small pericardial effusion, no valvular disorders. Renal duplex showed no significant renal artery stenosis - Discontinued lisinopril, HCTZ due to acute kidney injury. Increased hydralazine, continue labetalol, added clonidine tid. Patient's clonidine patch had fallen off.  - renal duplex showed no significant renal artery stenosis - TSH normal, renin aldosterone ratio normal, plasma metanephrines are pending, however fractionated catecholamines including norepinephrine, epinephrine and dopamine are elevated - Unfortunately due to renal function, cannot do CT of the abdomen and pelvis or MRI with contrast. Interestingly patient had a CT angiogram of the chest on 5/4 which showed normal adrenals.  -Plasma metanephrines are pending and will need to be followed up in the outpatient setting since blood pressures have been stabilized. -Nephrology did not recommend pursuing CT angiogram due to renal impairment. If plasma metanephrines come back elevated white recommend CTA once kidney function returns to normal. If levels are not elevated would recommend repeating plasma/urine metanephrines in several weeks. -Please follow-up on blood pressures.  Acute kidney injury -Likely secondary to nephrotoxicity possibly from antimicrobial therapy as well as ACE inhibitor therapy. Kidney function improving by 11/22/2014 having creatinine 1.92. -Trace follow up on BMP on hospital follow-up visit   Consultations:  CT surgery  Nephrology  Discharge Exam: Filed Vitals:   11/22/14 1137  BP: 143/74  Pulse: 60  Temp: 97.6 F (36.4 C)  Resp: 20   General: Alert and oriented x 3, NAD  HEENT: PERRLA, EOMI  Neck: Supple, no JVD, no masses  CVS: S1 S2 clear,  RRR  Respiratory: Clear to auscultation bilaterally  Abdomen: Soft, NT, ND, NBS  Ext: no c/c/e  Neuro: no new focal neurological deficits  Skin: No rashes    Discharge Instructions   Discharge Instructions    Call MD for:  difficulty breathing, headache or visual disturbances    Complete by:  As directed      Call MD for:  extreme fatigue    Complete by:  As directed      Call MD for:  hives    Complete by:  As directed      Call MD for:  persistant dizziness or light-headedness    Complete by:  As directed      Call MD for:  persistant nausea and vomiting    Complete by:  As directed      Call MD for:  redness, tenderness, or signs of infection (pain, swelling, redness, odor or green/yellow discharge around incision site)    Complete by:  As directed      Call MD for:  severe uncontrolled pain    Complete by:  As directed      Call MD for:  temperature >100.4    Complete by:  As directed      Diet - low sodium heart healthy    Complete by:  As directed      Increase activity slowly    Complete by:  As directed           Current Discharge Medication List    START taking these medications   Details  amLODipine (NORVASC) 10 MG tablet Take 1 tablet (10 mg total) by mouth daily. Qty: 30 tablet, Refills: 3    amoxicillin-clavulanate (AUGMENTIN) 875-125 MG per tablet Take 1 tablet by mouth 2 (  two) times daily. Qty: 42 tablet, Refills: 0    cloNIDine (CATAPRES) 0.2 MG tablet Take 1 tablet (0.2 mg total) by mouth 3 (three) times daily. Qty: 90 tablet, Refills: 3    hydrALAZINE (APRESOLINE) 100 MG tablet Take 1 tablet (100 mg total) by mouth every 8 (eight) hours. Qty: 90 tablet, Refills: 3    labetalol (NORMODYNE) 300 MG tablet Take 1 tablet (300 mg total) by mouth 2 (two) times daily. Qty: 60 tablet, Refills: 3    oxyCODONE (OXY IR/ROXICODONE) 5 MG immediate release tablet Take 1-2 tablets (5-10 mg total) by mouth every 6 (six) hours as needed for severe  pain. Qty: 15 tablet, Refills: 0      STOP taking these medications     naproxen sodium (ANAPROX) 220 MG tablet      hydrochlorothiazide (HYDRODIURIL) 12.5 MG tablet      lisinopril (PRINIVIL,ZESTRIL) 20 MG tablet      metoprolol (LOPRESSOR) 25 MG tablet        No Known Allergies Follow-up Information    Follow up with Melrose Nakayama, MD On 12/09/2014.   Specialty:  Cardiothoracic Surgery   Why:  PA/LAT CXR to be taken (at [facility] which is in the same building as Dr. Leonarda Salon office) on 12/09/2014 at 2:45 pm;Appointment time is at 3:35 pm   Contact information:   [place] [address] [phone]       Follow up with [facility]     On 11/26/2014.   Why:  @ 4:00 pm for Hospital F/u.    Contact information:   [address] [place] [address] [phone]       The results of significant diagnostics from this hospitalization (including imaging, microbiology, ancillary and laboratory) are listed below for reference.    Significant Diagnostic Studies: Dg Chest 2 View  11/19/2014   CLINICAL DATA:  Left-sided empyema. Chest discomfort. Empyema J86.9 (ICD-10-CM)  EXAM: CHEST  2 VIEW  COMPARISON:  11/18/2014  FINDINGS: Left inferior hemi thorax chest tube has been removed since the previous day's study. There is no pneumothorax. Left basilar opacity appears improved. Small mild of opacity at the left apex is consistent with atelectasis or scarring.  There is mild subsegmental atelectasis at the medial right lung base. No new areas of lung consolidation.  The residual pleural fluid.  Cardiac silhouette is mildly enlarged. No mediastinal or hilar masses.  Right IJ central venous line is stable from the previous day's study.  IMPRESSION: 1. Status post removal of the left-sided chest tube since the prior exam. No pneumothorax. 2. Significant improvement. Mild residual left basilar  opacity may all be atelectasis. There is no residual left pleural effusion. No new abnormalities.   Electronically Signed   By: Lajean Manes M.D.   On: 11/19/2014 08:39   Dg Chest 2 View  11/11/2014   CLINICAL DATA:  Left-sided chest pain and shortness of breath for 4 days  EXAM: CHEST  2 VIEW  COMPARISON:  June 21, 2013  FINDINGS: There is extensive consolidation throughout the left lower lobe. There is questionable infiltrate in the lingula as well. Right lung is clear. Heart is mildly enlarged with pulmonary vascularity within normal limits. No adenopathy.  IMPRESSION: Extensive left lower lobe airspace consolidation. There may also be consolidation in a portion of the lingula. Right lung is clear. No change in cardiac prominence.   Electronically Signed   By: Gwyndolyn Saxon  Jasmine December III M.D.   On: 11/11/2014 15:48   Ct Angio Chest Pe W/cm &/or Wo Cm  11/12/2014   CLINICAL DATA:  Chest pain and short of breath.  EXAM: CT ANGIOGRAPHY CHEST WITH CONTRAST  TECHNIQUE: Multidetector CT imaging of the chest was performed using the standard protocol during bolus administration of intravenous contrast. Multiplanar CT image reconstructions and MIPs were obtained to evaluate the vascular anatomy.  CONTRAST:  134mL OMNIPAQUE IOHEXOL 350 MG/ML SOLN  COMPARISON:  Plain film 11/11/2014, CT abdomen 10/14/2013  FINDINGS: Mediastinum/Nodes: There is no filling defects within the proximal main pulmonary arteries or the interlobar pulmonary arteries. Evaluation of the lower lobe segmental pulmonary arteries is degraded by patient respiratory motion.  No acute findings of the aorta great vessels. No pericardial fluid. Left ventricle appears mildly enlarged. No mediastinal lymphadenopathy.  Lungs/Pleura: There is large left pleural effusion which occupies 70% of the left hemi thorax volume. There is atelectasis of the left upper lobe and left lower lobe associated with these large pleural effusion. There is no clear obstructing  mass lesion obstructing the bronchus. The lung parenchyma itself is difficult to evaluate. The right lung is clear without evidence of fusion. There is mild atelectasis the right lung base.  Upper abdomen: Limited view of the liver, kidneys, pancreas are unremarkable. Normal adrenal glands.  No aggressive osseous lesion  Musculoskeletal: No aggressive osseous lesion  Review of the MIP images confirms the above findings.  IMPRESSION: 1. No evidence of acute pulmonary embolism within the main pulmonary arteries or intralobar pulmonary arteries. The lower lobe pulmonary arteries are poorly evaluated due to patient respiratory motion. 2. Large left pleural effusion occupying 70% of the left hemi thorax volume. There is passive atelectasis of the left lower lobe and left upper lobe. No obstructing mass lesion is identified.   Electronically Signed   By: Suzy Bouchard M.D.   On: 11/12/2014 01:08   Dg Chest Port 1 View  11/18/2014   CLINICAL DATA:  Left VA TS and drainage of left effusion, followup  EXAM: PORTABLE CHEST - 1 VIEW  COMPARISON:  Portable chest x-ray of 11/17/2014  FINDINGS: The left chest tubes have been removed. Matched that 1 of the 2 left chest tubes has been removed. No pneumothorax is seen. Haziness remains at the left lung base. The right lung is clear. Right IJ central venous line is unchanged in position.  IMPRESSION: One of 2 left chest tubes removed.  No pneumothorax.   Electronically Signed   By: Ivar Drape M.D.   On: 11/18/2014 08:01   Dg Chest Port 1 View  11/17/2014   CLINICAL DATA:  Vats.  EXAM: PORTABLE CHEST - 1 VIEW  COMPARISON:  11/16/2014.  11/15/2014.  FINDINGS: Interim removal of lateral most chest tube. Remaining 2 chest tubes in stable position. Right IJ line stable position. Stable cardiomegaly with normal pulmonary vascularity. Persistent left base atelectasis and/or infiltrate.  IMPRESSION: 1. Interim removal of lateral most left chest tube. Remaining 2 chest tubes in  stable position. No pneumothorax. 2. Persistent atelectasis and/or infiltrate left lower lobe. 3. Stable cardiomegaly   Electronically Signed   By: Marcello Moores  Register   On: 11/17/2014 07:52   Dg Chest Port 1 View  11/16/2014   CLINICAL DATA:  Status post left VATS with decortication and drainage of left pleural fluid.  EXAM: PORTABLE CHEST - 1 VIEW  COMPARISON:  Yesterday.  FINDINGS: Three left chest tubes remain in place with no pneumothorax seen. Right jugular  catheter tip in the superior vena cava. Stable enlarged cardiac silhouette and left basilar opacity. Unremarkable bones.  IMPRESSION: 1. Three left chest tubes without pneumothorax. 2. Stable cardiomegaly and left basilar atelectasis, pneumonia, pulmonary contusion or re-expansion edema.   Electronically Signed   By: Claudie Revering M.D.   On: 11/16/2014 08:37   Dg Chest Port 1 View  11/15/2014   CLINICAL DATA:  Chest tubes.  Dyspnea.  EXAM: PORTABLE CHEST - 1 VIEW  COMPARISON:  11/14/2014  FINDINGS: There are 3 left chest tubes. There is a right jugular central line extending into the SVC. There is no pneumothorax. There is left base consolidation. The right lung is clear.  IMPRESSION: Chest tubes appear unchanged in position. No pneumothorax. Unchanged left base consolidation.   Electronically Signed   By: Andreas Newport M.D.   On: 11/15/2014 06:20   Dg Chest Port 1 View  11/14/2014   CLINICAL DATA:  Empyema status post left lung decortication  EXAM: PORTABLE CHEST - 1 VIEW  COMPARISON:  11/12/2014  FINDINGS: Postsurgical changes in the left hemithorax. Two left apical chest tubes. Left basilar drain.  Small left pleural effusion, improved.  No pneumothorax is seen.  Right lung is clear.  Cardiomegaly.  Right IJ venous catheter terminates cavoatrial junction.  IMPRESSION: Postsurgical changes in the left hemithorax.  Small left pleural effusion, improved.  No pneumothorax is seen.  Two left apical chest tubes and left basilar drain.   Electronically  Signed   By: Julian Hy M.D.   On: 11/14/2014 15:50   Dg Chest Port 1 View  11/12/2014   CLINICAL DATA:  Post thoracentesis.  EXAM: PORTABLE CHEST - 1 VIEW  COMPARISON:  11/11/2014  FINDINGS: Large left pleural effusion with left lower lobe atelectasis or infiltrate. No pneumothorax following left thoracentesis. No scratch head minimal right base opacity, likely atelectasis. Cardiomegaly. No acute bony abnormality.  IMPRESSION: Large left pleural effusion with left lower lobe atelectasis or infiltrate. No pneumothorax.   Electronically Signed   By: Rolm Baptise M.D.   On: 11/12/2014 12:27    Microbiology: Recent Results (from the past 240 hour(s))  Surgical pcr screen     Status: None   Collection Time: 11/14/14  5:37 AM  Result Value Ref Range Status   MRSA, PCR NEGATIVE NEGATIVE Final   Staphylococcus aureus NEGATIVE NEGATIVE Final    Comment:        The Xpert SA Assay (FDA approved for NASAL specimens in patients over 63 years of age), is one component of a comprehensive surveillance program.  Test performance has been validated by [facility] for patients greater than or equal to 32 year old. It is not intended to diagnose infection nor to guide or monitor treatment.   Fungus Culture with Smear     Status: None (Preliminary result)   Collection Time: 11/14/14 12:25 PM  Result Value Ref Range Status   Specimen Description FLUID PLEURAL  Final   Special Requests POF VANCY ZOSYN PART B  Final   Fungal Smear   Final    NO YEAST OR FUNGAL ELEMENTS SEEN Performed at [facility]    Culture   Final    CULTURE IN PROGRESS FOR FOUR WEEKS Performed at [facility]    Report Status PENDING  Incomplete  Body fluid culture     Status: None   Collection Time: 11/14/14 12:25 PM  Result Value Ref Range Status   Specimen Description FLUID PLEURAL  Final  Special Requests POF VANCY ZOSYN PART B  Final   Gram Stain   Final    NO WBC SEEN NO ORGANISMS  SEEN Performed at [facility]    Culture   Final    NO GROWTH 3 DAYS Performed at [facility]    Report Status 11/18/2014 FINAL  Final  AFB culture with smear     Status: None (Preliminary result)   Collection Time: 11/14/14 12:25 PM  Result Value Ref Range Status   Specimen Description FLUID PLEURAL  Final   Special Requests POF VANCY ZOSYN PART B  Final   Acid Fast Smear   Final    NO ACID FAST BACILLI SEEN Performed at [facility]    Culture   Final    CULTURE WILL BE EXAMINED FOR 6 WEEKS BEFORE ISSUING A FINAL REPORT Performed at [facility]    Report Status PENDING  Incomplete  Fungus Culture with Smear     Status: None (Preliminary result)   Collection Time: 11/14/14 12:28 PM  Result Value Ref Range Status   Specimen Description TISSUE PLEURAL  Final   Special Requests POF VANCY ZOSYN PART C  Final   Fungal Smear   Final    NO YEAST OR FUNGAL ELEMENTS SEEN Performed at [facility]    Culture   Final    CULTURE IN PROGRESS FOR FOUR WEEKS Performed at [facility]    Report Status PENDING  Incomplete  Tissue culture     Status: None   Collection Time: 11/14/14 12:28 PM  Result Value Ref Range Status   Specimen Description TISSUE PLEURAL  Final   Special Requests POF VANCY ZOSYN PART C  Final   Gram Stain   Final    NO WBC SEEN NO SQUAMOUS EPITHELIAL CELLS SEEN NO ORGANISMS SEEN Performed at [facility]    Culture   Final    NO GROWTH 3 DAYS Performed at [facility]    Report Status 11/18/2014 FINAL  Final  AFB culture with smear     Status: None (Preliminary result)   Collection Time: 11/14/14 12:28 PM  Result Value Ref Range Status   Specimen Description TISSUE PLEURAL  Final   Special Requests POF VANCY ZOSYN PART C  Final   Acid Fast Smear   Final    NO ACID FAST BACILLI SEEN Performed at [facility]    Culture   Final    CULTURE WILL BE EXAMINED FOR 6 WEEKS BEFORE  ISSUING A FINAL REPORT Performed at [facility]    Report Status PENDING  Incomplete  Anaerobic culture     Status: None   Collection Time: 11/14/14 12:46 PM  Result Value Ref Range Status   Specimen Description WOUND LEFT PLEURAL  Final   Special Requests POF VANCY ZOSYN PART D  Final   Gram Stain   Final    FEW WBC PRESENT,BOTH PMN AND MONONUCLEAR NO SQUAMOUS EPITHELIAL CELLS SEEN NO ORGANISMS SEEN Performed at [facility]    Culture   Final    NO ANAEROBES ISOLATED Performed at [facility]    Report Status 11/19/2014 FINAL  Final  Wound culture     Status: None   Collection Time: 11/14/14 12:46 PM  Result Value Ref Range Status   Specimen Description WOUND LEFT PLEURAL  Final   Special Requests POF VANCY ZOSYN PART D  Final   Gram Stain   Final  FEW WBC PRESENT,BOTH PMN AND MONONUCLEAR NO SQUAMOUS EPITHELIAL CELLS SEEN NO ORGANISMS SEEN Performed at [facility]    Culture   Final    NO GROWTH 2 DAYS Performed at [facility]    Report Status 11/17/2014 FINAL  Final  Fungus Culture with Smear     Status: None (Preliminary result)   Collection Time: 11/14/14 12:54 PM  Result Value Ref Range Status   Specimen Description TISSUE LEFT PLEURAL  Final   Special Requests POF VANCY ZOSYN PART E  Final   Fungal Smear   Final    NO YEAST OR FUNGAL ELEMENTS SEEN Performed at [facility]    Culture   Final    CULTURE IN PROGRESS FOR FOUR WEEKS Performed at [facility]    Report Status PENDING  Incomplete  Tissue culture     Status: None   Collection Time: 11/14/14 12:54 PM  Result Value Ref Range Status   Specimen Description TISSUE LEFT PLEURAL  Final   Special Requests POF VANCY ZOSYN PART E  Final   Gram Stain   Final    RARE WBC PRESENT, PREDOMINANTLY MONONUCLEAR NO ORGANISMS SEEN Performed at [facility]    Culture   Final    NO GROWTH 3 DAYS Performed at [facility]    Report  Status 11/18/2014 FINAL  Final  AFB culture with smear     Status: None (Preliminary result)   Collection Time: 11/14/14 12:54 PM  Result Value Ref Range Status   Specimen Description TISSUE LEFT PLEURAL  Final   Special Requests POF VANCY ZOSYN PART E  Final   Acid Fast Smear   Final    NO ACID FAST BACILLI SEEN Performed at [facility]    Culture   Final    CULTURE WILL BE EXAMINED FOR 6 WEEKS BEFORE ISSUING A FINAL REPORT Performed at [facility]    Report Status PENDING  Incomplete     Labs: Basic Metabolic Panel:  Recent Labs Lab 11/19/14 0605 11/19/14 1600 11/20/14 0350 11/21/14 0445 11/22/14 0508  NA 134* 134* 136 136 136  K 3.6 3.5 3.8 3.7 3.5  CL 96* 97* 101 100* 100*  CO2 28 25 25 26 26   GLUCOSE 98 98 110* 102* 110*  BUN 13 15 18 13 10   CREATININE 2.10* 2.46* 2.46* 2.20* 1.92*  CALCIUM 8.8* 8.9 8.6* 8.9 8.8*   Liver Function Tests:  Recent Labs Lab 11/16/14 0430  AST 14*  ALT 10*  ALKPHOS 68  BILITOT 0.7  PROT 6.9  ALBUMIN 2.4*   No results for input(s): LIPASE, AMYLASE in the last 168 hours. No results for input(s): AMMONIA in the last 168 hours. CBC:  Recent Labs Lab 11/16/14 0430 11/17/14 0515 11/18/14 0418 11/19/14 0605 11/20/14 0350  WBC 21.7* 15.9* 14.0* 10.9* 10.9*  HGB 11.8* 12.2 11.4* 10.9* 10.7*  HCT 36.6 37.3 35.5* 34.3* 33.1*  MCV 80.4 79.4 79.2 79.4 79.2  PLT 508* 527* 510* 484* 449*   Cardiac Enzymes: No results for input(s): CKTOTAL, CKMB, CKMBINDEX, TROPONINI in the last 168 hours. BNP: BNP (last 3 results)  Recent Labs  11/12/14 0355  BNP 138.6*    ProBNP (last 3 results) No results for input(s): PROBNP in the last 8760 hours.  CBG: No results for input(s): GLUCAP in the last 168 hours.     SignedKelvin Cellar  [facility] 11/22/2014, 1:00 PM

## 2014-11-25 LAB — CATECHOLAMINES, FRACTIONATED, PLASMA
Dopamine: 150 pg/mL — ABNORMAL HIGH (ref 0–48)
Epinephrine: 322 pg/mL — ABNORMAL HIGH (ref 0–62)
Norepinephrine: 2153 pg/mL — ABNORMAL HIGH (ref 0–874)

## 2014-11-26 ENCOUNTER — Encounter: Payer: Self-pay | Admitting: Family Medicine

## 2014-11-26 ENCOUNTER — Ambulatory Visit: Payer: Medicaid Other | Attending: Family Medicine | Admitting: Family Medicine

## 2014-11-26 VITALS — BP 154/88 | HR 66 | Temp 98.1°F | Resp 18 | Ht 68.0 in | Wt 199.0 lb

## 2014-11-26 DIAGNOSIS — J189 Pneumonia, unspecified organism: Secondary | ICD-10-CM | POA: Diagnosis present

## 2014-11-26 DIAGNOSIS — I10 Essential (primary) hypertension: Secondary | ICD-10-CM | POA: Insufficient documentation

## 2014-11-26 DIAGNOSIS — N183 Chronic kidney disease, stage 3 unspecified: Secondary | ICD-10-CM

## 2014-11-26 DIAGNOSIS — J869 Pyothorax without fistula: Secondary | ICD-10-CM | POA: Insufficient documentation

## 2014-11-26 DIAGNOSIS — M940 Chondrocostal junction syndrome [Tietze]: Secondary | ICD-10-CM | POA: Diagnosis not present

## 2014-11-26 MED ORDER — TRAMADOL HCL 50 MG PO TABS: 50.0000 mg | ORAL_TABLET | Freq: Three times a day (TID) | ORAL | Status: DC | PRN

## 2014-11-26 NOTE — Patient Instructions (Signed)
Costochondritis Costochondritis, sometimes called Tietze syndrome, is a swelling and irritation (inflammation) of the tissue (cartilage) that connects your ribs with your breastbone (sternum). It causes pain in the chest and rib area. Costochondritis usually goes away on its own over time. It can take up to 6 weeks or longer to get better, especially if you are unable to limit your activities. CAUSES  Some cases of costochondritis have no known cause. Possible causes include:  Injury (trauma).  Exercise or activity such as lifting.  Severe coughing. SIGNS AND SYMPTOMS  Pain and tenderness in the chest and rib area.  Pain that gets worse when coughing or taking deep breaths.  Pain that gets worse with specific movements. DIAGNOSIS  Your health care provider will do a physical exam and ask about your symptoms. Chest X-rays or other tests may be done to rule out other problems. TREATMENT  Costochondritis usually goes away on its own over time. Your health care provider may prescribe medicine to help relieve pain. HOME CARE INSTRUCTIONS   Avoid exhausting physical activity. Try not to strain your ribs during normal activity. This would include any activities using chest, abdominal, and side muscles, especially if heavy weights are used.  Apply ice to the affected area for the first 2 days after the pain begins.  Put ice in a plastic bag.  Place a towel between your skin and the bag.  Leave the ice on for 20 minutes, 2-3 times a day.  Only take over-the-counter or prescription medicines as directed by your health care provider. SEEK MEDICAL CARE IF:  You have redness or swelling at the rib joints. These are signs of infection.  Your pain does not go away despite rest or medicine. SEEK IMMEDIATE MEDICAL CARE IF:   Your pain increases or you are very uncomfortable.  You have shortness of breath or difficulty breathing.  You cough up blood.  You have worse chest pains,  sweating, or vomiting.  You have a fever or persistent symptoms for more than 2-3 days.  You have a fever and your symptoms suddenly get worse. MAKE SURE YOU:   Understand these instructions.  Will watch your condition.  Will get help right away if you are not doing well or get worse. Document Released: 04/06/2005 Document Revised: 04/17/2013 Document Reviewed: 01/29/2013 Exit[employer] Patient Information 2015 [employer]. This information is not intended to replace advice given to you by your health care provider. Make sure you discuss any questions you have with your health care provider.  

## 2014-11-26 NOTE — Progress Notes (Signed)
Subjective:    Patient ID: Emily Key, female    DOB: 1970-11-04, 44 y.o.   MRN: [id number]  HPI Admit date: 11/11/2014 Discharge date: 11/22/2014  Emily Key is a 44 year old female with a history of hypertension who had not been off medications previously due to financial constraints who had presented with left sided chest pain, diaphoresis. She was tachypneic and tachycardic and CXR revealed left sided pneumonia, CT of the chest showed large pleural effusion with extensive consolidation. Pulmonary was consulted but unable to do therapeutic thoracentesis due to loculated pleural effusion. CT surgery performed VATS on 5/6 with drainage of left pleural effusion/empyema/lung abscess, decortication and insertion of three chest tubes and she was placed on Vanc and Zosyn. Tissue cultures, fungal cultures, wound cultures , AFB were negative  She was commenced on Cardene drip in the ICU for blood pressure control; she was also noted to have acute kidney injury with a rise in creatinine from 0.91 to 2.46 which trended down to 1.92 prior to discharge and she was seen by Nephrology and renal artery Doppler was ordered which came back negative for renal artery stenosis. Plasma metanephrines were also sent off.Her 2d echo revealed a normal EF with smal pericardial effusion.  Her condition improved and she was weaned off the Cardene drip and switched to oral Augmentin for 1 month with an outpatient appointment with CT Surgery.  Interval history: She complains of pain at the tip of the sternum which is  8/10 and also pain at the site where the chest tubes were removed. She would also like to follow-up on her metanephrine levels which was sent off to evaluate for Phaeochromocytoma and this came back normal. She has no dyspnea or fever and is currently taking her Augmentin. Denies fevers or cough.  Past Medical History  Diagnosis Date  . Coronary artery disease   . Hypertension   . CHF (congestive  heart failure)     after last pregnancy    Past Surgical History  Procedure Laterality Date  . Tonsillectomy    . Tubal ligation    . Video assisted thoracoscopy (vats)/decortication Left 11/14/2014    Procedure: LEFT VIDEO ASSISTED THORACOSCOPY (VATS)/DECORTICATION;  Surgeon: Melrose Nakayama, MD;  Location: [facility];  Service: Thoracic;  Laterality: Left;  . Pleural effusion drainage Left 11/14/2014    Procedure: DRAINAGE OF LEFT PLEURAL EFFUSION;  Surgeon: Melrose Nakayama, MD;  Location: [facility];  Service: Thoracic;  Laterality: Left;    History   Social History  . Marital Status: Divorced    Spouse Name: N/A  . Number of Children: N/A  . Years of Education: N/A   Occupational History  . Not on file.   Social History Main Topics  . Smoking status: Former Smoker -- 0.25 packs/day    Types: Cigarettes    Quit date: 11/11/2014  . Smokeless tobacco: Not on file  . Alcohol Use: No  . Drug Use: No  . Sexual Activity: Not on file   Other Topics Concern  . Not on file   Social History Narrative    No Known Allergies  Current Outpatient Prescriptions on File Prior to Visit  Medication Sig Dispense Refill  . amLODipine (NORVASC) 10 MG tablet Take 1 tablet (10 mg total) by mouth daily. 30 tablet 3  . amoxicillin-clavulanate (AUGMENTIN) 875-125 MG per tablet Take 1 tablet by mouth 2 (two) times daily. 42 tablet 0  . cloNIDine (CATAPRES) 0.2 MG tablet Take 1 tablet (0.2  mg total) by mouth 3 (three) times daily. 90 tablet 3  . hydrALAZINE (APRESOLINE) 100 MG tablet Take 1 tablet (100 mg total) by mouth every 8 (eight) hours. 90 tablet 3  . labetalol (NORMODYNE) 300 MG tablet Take 1 tablet (300 mg total) by mouth 2 (two) times daily. 60 tablet 3  . oxyCODONE (OXY IR/ROXICODONE) 5 MG immediate release tablet Take 1-2 tablets (5-10 mg total) by mouth every 6 (six) hours as needed for severe pain. 15 tablet 0   No current facility-administered medications on file prior to visit.        Review of Systems  Constitutional: Negative for activity change, appetite change and fatigue.  HENT: Negative for congestion, sinus pressure and sore throat.   Eyes: Negative for visual disturbance.  Respiratory: Negative for cough, chest tightness, shortness of breath and wheezing.   Cardiovascular: Positive for chest pain. Negative for palpitations.  Gastrointestinal: Negative for abdominal pain, constipation and abdominal distention.  Endocrine: Negative for polydipsia.  Genitourinary: Negative for dysuria and frequency.  Musculoskeletal: Negative for back pain and arthralgias.  Skin: Negative for rash.  Neurological: Negative for tremors, light-headedness and numbness.  Hematological: Does not bruise/bleed easily.  Psychiatric/Behavioral: Negative for behavioral problems and agitation.         Objective: Filed Vitals:   11/26/14 1549  BP: 154/88  Pulse: 66  Temp: 98.1 F (36.7 C)  TempSrc: Oral  Resp: 18  Height: 5\' 8"  (1.727 m)  Weight: 199 lb (90.266 kg)  SpO2: 98%      Physical Exam  Constitutional: She is oriented to person, place, and time. She appears well-developed and well-nourished. No distress.  HENT:  Head: Normocephalic.  Right Ear: External ear normal.  Left Ear: External ear normal.  Nose: Nose normal.  Mouth/Throat: Oropharynx is clear and moist.  Eyes: Conjunctivae and EOM are normal. Pupils are equal, round, and reactive to light.  Neck: Normal range of motion. No JVD present.  Cardiovascular: Normal rate, regular rhythm, normal heart sounds and intact distal pulses.  Exam reveals no gallop.   No murmur heard. Pulmonary/Chest: Effort normal and breath sounds normal. No respiratory distress. She has no wheezes. She has no rales.  Tenderness on deep palpation of tip of the sternum  Abdominal: Soft. Bowel sounds are normal. She exhibits no distension and no mass. There is no tenderness.  Musculoskeletal: Normal range of motion. She exhibits  no edema or tenderness.  Neurological: She is alert and oriented to person, place, and time. She has normal reflexes.  Skin: She is not diaphoretic.  Left lateral chest wall with sutures in place at the port of chest wall removal  Psychiatric: She has a normal mood and affect.     CMP Latest Ref Rng 11/22/2014 11/21/2014 11/20/2014  Glucose 65 - 99 mg/dL 110(H) 102(H) 110(H)  BUN 6 - 20 mg/dL 10 13 18   Creatinine 0.44 - 1.00 mg/dL 1.92(H) 2.20(H) 2.46(H)  Sodium 135 - 145 mmol/L 136 136 136  Potassium 3.5 - 5.1 mmol/L 3.5 3.7 3.8  Chloride 101 - 111 mmol/L 100(L) 100(L) 101  CO2 22 - 32 mmol/L 26 26 25   Calcium 8.9 - 10.3 mg/dL 8.8(L) 8.9 8.6(L)  Total Protein 6.5 - 8.1 g/dL - - -  Total Bilirubin 0.3 - 1.2 mg/dL - - -  Alkaline Phos 38 - 126 U/L - - -  AST 15 - 41 U/L - - -  ALT 14 - 54 U/L - - -  Assessment & Plan:  44 year old female with a history of Hypertension recently managed for left lung Pneumonia and para pneumonic effusion status post VATS and drainage of left pleural effusion/empyema/ lung abscess and decorticaton of lung now doing well on Augmentin.  Hypertension: Not at goal; I will hold off on adjusting her antihypertensives as pain could be contributing to current elevation. metanephrines- neg; will repeat in a few weeks. CMET at next visit  Community-acquired pneumonia: Currently on day #4 of Augmentin.  Costochondritis: Due to chronic kidney disease and was able to place her on NSAIDs; she has been advised to take tramadol as needed.  Emphysema: Status post chest tube removal. Sutures removed and patient tolerated procedure well, gauze dressing applied afterward. Advised to taper off oxycodone from 3 times a day dosing to twice daily and I will have her transition to tramadol. Sutures removed an patient tolerated procedure well.  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 Avoid  nephrotoxins; will monitor renal function.  Disclaimer: This note was dictated with voice recognition software. Similar sounding words can inadvertently be transcribed and this note may contain transcription errors which may not have been corrected upon publication of note.

## 2014-11-26 NOTE — Progress Notes (Signed)
Patient hospitalized last week with pneumonia. Patient had 3 chest tubes. Patient complaining of tenderness to touch in mid chest at level 8. Almost out of pain medications, would like discuss pain medication options. Patient reports doctor at hospital told her that "something right on top of kidney is producing too much".

## 2014-11-28 DIAGNOSIS — N189 Chronic kidney disease, unspecified: Secondary | ICD-10-CM | POA: Insufficient documentation

## 2014-12-03 ENCOUNTER — Ambulatory Visit (HOSPITAL_COMMUNITY)
Admission: RE | Admit: 2014-12-03 | Discharge: 2014-12-03 | Disposition: A | Discharge status: Home / Self Care | Payer: Medicaid Other | Source: Ambulatory Visit | Attending: Family Medicine | Admitting: Family Medicine

## 2014-12-03 ENCOUNTER — Ambulatory Visit (HOSPITAL_BASED_OUTPATIENT_CLINIC_OR_DEPARTMENT_OTHER): Payer: Medicaid Other | Admitting: Family Medicine

## 2014-12-03 ENCOUNTER — Encounter: Payer: Self-pay | Admitting: Family Medicine

## 2014-12-03 VITALS — BP 135/79 | HR 61 | Temp 98.0°F | Resp 18 | Ht 68.0 in | Wt 195.0 lb

## 2014-12-03 DIAGNOSIS — N183 Chronic kidney disease, stage 3 unspecified: Secondary | ICD-10-CM

## 2014-12-03 DIAGNOSIS — J189 Pneumonia, unspecified organism: Secondary | ICD-10-CM

## 2014-12-03 DIAGNOSIS — R0789 Other chest pain: Secondary | ICD-10-CM | POA: Diagnosis present

## 2014-12-03 DIAGNOSIS — J869 Pyothorax without fistula: Secondary | ICD-10-CM

## 2014-12-03 DIAGNOSIS — I517 Cardiomegaly: Secondary | ICD-10-CM | POA: Insufficient documentation

## 2014-12-03 DIAGNOSIS — I10 Essential (primary) hypertension: Secondary | ICD-10-CM | POA: Diagnosis not present

## 2014-12-03 DIAGNOSIS — R05 Cough: Secondary | ICD-10-CM | POA: Insufficient documentation

## 2014-12-03 DIAGNOSIS — R918 Other nonspecific abnormal finding of lung field: Secondary | ICD-10-CM | POA: Diagnosis not present

## 2014-12-03 LAB — COMPREHENSIVE METABOLIC PANEL
ALBUMIN: 3.7 g/dL (ref 3.5–5.2)
ALK PHOS: 142 U/L — AB (ref 39–117)
ALT: 55 U/L — ABNORMAL HIGH (ref 0–35)
AST: 31 U/L (ref 0–37)
BILIRUBIN TOTAL: 0.3 mg/dL (ref 0.2–1.2)
BUN: 12 mg/dL (ref 6–23)
CO2: 31 mEq/L (ref 19–32)
Calcium: 9.4 mg/dL (ref 8.4–10.5)
Chloride: 100 mEq/L (ref 96–112)
Creat: 1.07 mg/dL (ref 0.50–1.10)
Glucose, Bld: 119 mg/dL — ABNORMAL HIGH (ref 70–99)
Potassium: 4 mEq/L (ref 3.5–5.3)
Sodium: 140 mEq/L (ref 135–145)
TOTAL PROTEIN: 7.6 g/dL (ref 6.0–8.3)

## 2014-12-03 NOTE — Progress Notes (Signed)
Subjective:    Patient ID: Emily Key, female    DOB: September 06, 1970, 44 y.o.   MRN: [id number]  HPI Emily Key is a 44 year old female with a history of Hypertension recently managed for left lung Pneumonia and para pneumonic effusion status post VATS and drainage of left pleural effusion/empyema/ lung abscess and decorticaton of lung who has completed a course of Augmentin and is here for follow-up of her blood pressure which was elevated at her last office visit.  She states she "does not feel good"; has had insomnia and wakes up every 2 hours at night, complains of pressure on left side which feels like "it is pushing from the inside outside". Denies any cough, fever, dyspnea, and appetite is almost back to normal. She feels sad because of her overall condition as she is not back to baseline; scheduled to see cardiac and thoracic surgery on 12/09/14.  Past Medical History  Diagnosis Date  . Coronary artery disease   . Hypertension   . CHF (congestive heart failure)     after last pregnancy    Past Surgical History  Procedure Laterality Date  . Tonsillectomy    . Tubal ligation    . Video assisted thoracoscopy (vats)/decortication Left 11/14/2014    Procedure: LEFT VIDEO ASSISTED THORACOSCOPY (VATS)/DECORTICATION;  Surgeon: Melrose Nakayama, MD;  Location: [facility];  Service: Thoracic;  Laterality: Left;  . Pleural effusion drainage Left 11/14/2014    Procedure: DRAINAGE OF LEFT PLEURAL EFFUSION;  Surgeon: Melrose Nakayama, MD;  Location: [facility];  Service: Thoracic;  Laterality: Left;    History   Social History  . Marital Status: Divorced    Spouse Name: N/A  . Number of Children: N/A  . Years of Education: N/A   Occupational History  . Not on file.   Social History Main Topics  . Smoking status: Former Smoker -- 0.25 packs/day    Types: Cigarettes    Quit date: 11/11/2014  . Smokeless tobacco: Not on file  . Alcohol Use: No  . Drug Use: No  . Sexual Activity: Not  on file   Other Topics Concern  . Not on file   Social History Narrative    No Known Allergies  Current Outpatient Prescriptions on File Prior to Visit  Medication Sig Dispense Refill  . amLODipine (NORVASC) 10 MG tablet Take 1 tablet (10 mg total) by mouth daily. 30 tablet 3  . amoxicillin-clavulanate (AUGMENTIN) 875-125 MG per tablet Take 1 tablet by mouth 2 (two) times daily. 42 tablet 0  . cloNIDine (CATAPRES) 0.2 MG tablet Take 1 tablet (0.2 mg total) by mouth 3 (three) times daily. 90 tablet 3  . hydrALAZINE (APRESOLINE) 100 MG tablet Take 1 tablet (100 mg total) by mouth every 8 (eight) hours. 90 tablet 3  . labetalol (NORMODYNE) 300 MG tablet Take 1 tablet (300 mg total) by mouth 2 (two) times daily. 60 tablet 3  . traMADol (ULTRAM) 50 MG tablet Take 1 tablet (50 mg total) by mouth every 8 (eight) hours as needed. 30 tablet 0  . oxyCODONE (OXY IR/ROXICODONE) 5 MG immediate release tablet Take 1-2 tablets (5-10 mg total) by mouth every 6 (six) hours as needed for severe pain. (Patient not taking: Reported on 12/03/2014) 15 tablet 0   No current facility-administered medications on file prior to visit.       Review of Systems  Constitutional: Negative for activity change, appetite change and fatigue.  HENT: Negative for congestion, sinus pressure  and sore throat.   Eyes: Negative for visual disturbance.  Respiratory:       See history of present illness  Cardiovascular: Negative for chest pain and palpitations.  Gastrointestinal: Negative for abdominal pain, constipation and abdominal distention.  Endocrine: Negative for polydipsia.  Genitourinary: Negative for dysuria and frequency.  Musculoskeletal: Negative for back pain and arthralgias.  Skin: Negative for rash.  Neurological: Negative for tremors, light-headedness and numbness.  Psychiatric/Behavioral: Positive for sleep disturbance.         Objective: Filed Vitals:   12/03/14 1547  BP: 135/79  Pulse: 61    Temp: 98 F (36.7 C)  TempSrc: Oral  Resp: 18  Height: 5\' 8"  (1.727 m)  Weight: 195 lb (88.451 kg)  SpO2: 99%      Physical Exam  Constitutional: She is oriented to person, place, and time. She appears well-developed and well-nourished. No distress.  Neck: Normal range of motion. No JVD present.  Cardiovascular: Normal rate, regular rhythm, normal heart sounds and intact distal pulses.  Exam reveals no gallop.   No murmur heard. Pulmonary/Chest: Effort normal and breath sounds normal. No respiratory distress. She has no wheezes. She has no rales. She exhibits no tenderness.  Left lateral thoracic scars at two of the chest tube ports which have healed, third port is not healed with some minimal serous drainage  Abdominal: Soft. Bowel sounds are normal. She exhibits no distension and no mass. There is no tenderness.  Neurological: She is alert and oriented to person, place, and time. She has normal reflexes.  Skin: She is not diaphoretic.  Psychiatric:  Dysphoric mood.      CMP Latest Ref Rng 11/22/2014 11/21/2014 11/20/2014  Glucose 65 - 99 mg/dL 110(H) 102(H) 110(H)  BUN 6 - 20 mg/dL 10 13 18   Creatinine 0.44 - 1.00 mg/dL 1.92(H) 2.20(H) 2.46(H)  Sodium 135 - 145 mmol/L 136 136 136  Potassium 3.5 - 5.1 mmol/L 3.5 3.7 3.8  Chloride 101 - 111 mmol/L 100(L) 100(L) 101  CO2 22 - 32 mmol/L 26 26 25   Calcium 8.9 - 10.3 mg/dL 8.8(L) 8.9 8.6(L)  Total Protein 6.5 - 8.1 g/dL - - -  Total Bilirubin 0.3 - 1.2 mg/dL - - -  Alkaline Phos 38 - 126 U/L - - -  AST 15 - 41 U/L - - -  ALT 14 - 54 U/L - - -         Assessment & Plan:  44 year old female with a history of Hypertension recently managed for left lung Pneumonia and para pneumonic effusion status post VATS and drainage of left pleural effusion/empyema/ lung abscess and decorticaton of lung here for follow-up of blood pressure.  Hypertension: Improved continue antihypertensives.  Pneumonia and parapneumonic effusion: I will  send off a CBC and also chest x-ray to evaluate for resolution especially in the setting of her not feeling well today. Dressing change performed at the chest tube port.  Chronic kidney disease  Last creatinine was 1.92 CMET today and will refer to Nephrology if creatinine is still elevated.  Behavioral health called in to see the patient due to dysphoric mood as this could be related to underlying medical condition.  Disclaimer: This note was dictated with voice recognition software. Similar sounding words can inadvertently be transcribed and this note may contain transcription errors which may not have been corrected upon publication of note.

## 2014-12-03 NOTE — Progress Notes (Signed)
ASSESSMENT: Pt currently experiencing symptoms of depression following surgery. Pt needs to f/u with PCP and [facility]. She would benefit from psychoeducation and supportive counseling to learn to cope with symptoms of depression and to accept current health status.  Stage of Change: contemplative  PLAN: 1. F/U with behavioral health consultant in 2 weeks 2. Psychiatric Medications: none. 3. Behavioral recommendation(s):   -Get out of the house with sister in upcoming week -Consider reading through educational material about coping with symptoms of depression -Consider accepting health condition as new normal  SUBJECTIVE: Pt. referred by Dr Jarold Song for depression:  Pt. here for referral regarding symptoms of depression.  Pt. reports the following symptoms/concerns: Pt states that she has not felt symptoms of depression in the past, that this is the first time she has felt low; in the past, she has taken care of others, but now others are taking care of her, and she does not like it. Pt says she has no energy. She states that she likes being more active, and that she wants to get back to work. Her family is a big support for her, as she adjusts to new health condition. Duration of problem: 3 weeks Severity: moderate  OBJECTIVE: Orientation & Cognition: Oriented x3. Thought processes normal and appropriate to situation. Mood: low, teary Affect: appropriate Appearance: appropriate Risk of harm to self or others: no risk of harm to self or others Substance use: none Psychiatric medication use: none. Assessments administered: PSQ9-16/ GAD7-10  Diagnosis: Adjustment disorder with depressed mood CPT Code: F43.21 -------------------------------------------- Other(s) present in the room: sister  Time spent with patient in exam room: 30 minutes

## 2014-12-03 NOTE — Progress Notes (Signed)
Patient here for follow up HTN and pneumonia. Patient reports feeling sluggish, "breathing ain't great, I don't know". Patient reports pressure, at level 6, in left, upper back. Patient not sleeping well, wakes up every couple hours, due to pressure. "I don't want to keep taking pain pills because it's not a pain, it's just pressure".

## 2014-12-03 NOTE — Patient Instructions (Signed)

## 2014-12-04 ENCOUNTER — Telehealth: Payer: Self-pay

## 2014-12-04 ENCOUNTER — Other Ambulatory Visit: Payer: Self-pay | Admitting: Family Medicine

## 2014-12-04 ENCOUNTER — Other Ambulatory Visit: Payer: Self-pay | Admitting: Thoracic Surgery (Cardiothoracic Vascular Surgery)

## 2014-12-04 DIAGNOSIS — J869 Pyothorax without fistula: Secondary | ICD-10-CM

## 2014-12-04 DIAGNOSIS — J189 Pneumonia, unspecified organism: Secondary | ICD-10-CM

## 2014-12-04 LAB — CBC WITH DIFFERENTIAL/PLATELET
BASOS PCT: 1 % (ref 0–1)
Basophils Absolute: 0.1 10*3/uL (ref 0.0–0.1)
EOS PCT: 3 % (ref 0–5)
Eosinophils Absolute: 0.2 10*3/uL (ref 0.0–0.7)
HCT: 34.1 % — ABNORMAL LOW (ref 36.0–46.0)
Hemoglobin: 10.9 g/dL — ABNORMAL LOW (ref 12.0–15.0)
Lymphocytes Relative: 30 % (ref 12–46)
Lymphs Abs: 2.1 10*3/uL (ref 0.7–4.0)
MCH: 25.3 pg — ABNORMAL LOW (ref 26.0–34.0)
MCHC: 32 g/dL (ref 30.0–36.0)
MCV: 79.1 fL (ref 78.0–100.0)
MONO ABS: 0.4 10*3/uL (ref 0.1–1.0)
MPV: 8.9 fL (ref 8.6–12.4)
Monocytes Relative: 6 % (ref 3–12)
Neutro Abs: 4.2 10*3/uL (ref 1.7–7.7)
Neutrophils Relative %: 60 % (ref 43–77)
Platelets: 506 10*3/uL — ABNORMAL HIGH (ref 150–400)
RBC: 4.31 MIL/uL (ref 3.87–5.11)
RDW: 15.1 % (ref 11.5–15.5)
WBC: 7 10*3/uL (ref 4.0–10.5)

## 2014-12-04 NOTE — Telephone Encounter (Signed)
-----   Message from [name], MD sent at 12/04/2014  9:36 AM EDT ----- Please inform her that her labs reveal a normal white cell count, kidney function has improved. Chest x-ray is concerning for residual fluid and so I have ordered a CT chest.

## 2014-12-05 NOTE — Telephone Encounter (Signed)
Nurse called patient to inform patient of Chest CT at [facility], June 16 at 8am. No food or drink after midnight. Reached automated message stating "At the subscribers request this phone does not accept incoming calls".

## 2014-12-05 NOTE — Telephone Encounter (Signed)
Number is invalid. Need current contact number for patient.

## 2014-12-05 NOTE — Telephone Encounter (Signed)
-----   Message from [name], MD sent at 12/04/2014  9:36 AM EDT ----- Please inform her that her labs reveal a normal white cell count, kidney function has improved. Chest x-ray is concerning for residual fluid and so I have ordered a CT chest.

## 2014-12-09 ENCOUNTER — Encounter: Payer: Self-pay | Admitting: Thoracic Surgery (Cardiothoracic Vascular Surgery)

## 2014-12-09 ENCOUNTER — Ambulatory Visit (INDEPENDENT_AMBULATORY_CARE_PROVIDER_SITE_OTHER): Payer: Self-pay | Admitting: Thoracic Surgery (Cardiothoracic Vascular Surgery)

## 2014-12-09 ENCOUNTER — Other Ambulatory Visit: Payer: Self-pay | Admitting: *Deleted

## 2014-12-09 ENCOUNTER — Ambulatory Visit
Admission: RE | Admit: 2014-12-09 | Discharge: 2014-12-09 | Disposition: A | Discharge status: Home / Self Care | Payer: Medicaid Other | Source: Ambulatory Visit | Attending: Thoracic Surgery (Cardiothoracic Vascular Surgery) | Admitting: Thoracic Surgery (Cardiothoracic Vascular Surgery)

## 2014-12-09 VITALS — BP 157/100 | HR 80 | Resp 16 | Ht 68.0 in | Wt 195.0 lb

## 2014-12-09 DIAGNOSIS — J869 Pyothorax without fistula: Secondary | ICD-10-CM

## 2014-12-09 DIAGNOSIS — G8918 Other acute postprocedural pain: Secondary | ICD-10-CM

## 2014-12-09 DIAGNOSIS — J948 Other specified pleural conditions: Secondary | ICD-10-CM

## 2014-12-09 DIAGNOSIS — J90 Pleural effusion, not elsewhere classified: Secondary | ICD-10-CM

## 2014-12-09 MED ORDER — TRAMADOL HCL 50 MG PO TABS: 50.0000 mg | ORAL_TABLET | Freq: Three times a day (TID) | ORAL | Status: DC | PRN

## 2014-12-09 NOTE — Progress Notes (Signed)
[address]       [place] [address]             [phone]       HPI:  Emily Key turns today for a scheduled postoperative follow-up visit.  She is a 44 year old woman who presented with a two-week history of cough and malaise which progressed to a chest pain and shortness of breath. She was found to have a loculated left pleural effusion. She underwent a left VATS, drainage of empyema, and decortication on 11/14/2014. She was noted to have an empyema and a lung abscess in the superior segment of the left lower lobe.  Postoperatively she recovered uneventfully.  She states that she is still having a lot of soreness and an occasional sharp pain in her left chest. Her breathing is markedly improved from preop. Her appetite is improving. She is having to take tramadol for pain about every 6-8 hours. She has stopped smoking since this incident.  Past Medical History  Diagnosis Date  . Coronary artery disease   . Hypertension   . CHF (congestive heart failure)     after last pregnancy      Current Outpatient Prescriptions  Medication Sig Dispense Refill  . amLODipine (NORVASC) 10 MG tablet Take 1 tablet (10 mg total) by mouth daily. 30 tablet 3  . cloNIDine (CATAPRES) 0.2 MG tablet Take 1 tablet (0.2 mg total) by mouth 3 (three) times daily. 90 tablet 3  . hydrALAZINE (APRESOLINE) 100 MG tablet Take 1 tablet (100 mg total) by mouth every 8 (eight) hours. 90 tablet 3  . labetalol (NORMODYNE) 300 MG tablet Take 1 tablet (300 mg total) by mouth 2 (two) times daily. 60 tablet 3  . traMADol (ULTRAM) 50 MG tablet Take 1 tablet (50 mg total) by mouth every 8 (eight) hours as needed. 40 tablet 0   No current facility-administered medications for this visit.    Physical Exam BP 157/100 mmHg  Pulse 80  Resp 16  Ht 5\' 8"  (1.727 m)  Wt 195 lb (88.451 kg)  BMI 29.66 kg/m2  SpO2   LMP 11/11/2014 44 year old woman in no acute distress Alert and oriented 3  with no focal deficits Lungs clear with equal breath says bilaterally Incisions well healed Cardiac regular rate and rhythm normal S1 and S2  Diagnostic Tests: Chest x-ray reviewed and shows marked improvement of aeration of the left lung  Impression: 44 year old woman who is now about a month out from a definitive empyema and decortication. She did have an abscess of the superior segment of the left lower lobe. She is recovering well. She is still requiring some tramadol I gave her another prescription for that today.  I do think that since she had a lung abscess and has a history of smoking we should do a CT of the chest to make sure there is no endobronchial lesion account for her developing a lung access. I will wait a little longer over the acute inflammatory process resolved before doing so. She did have a CT initially but there is a much atelectasis and pleural fluid that smaller mass could be missed.  She may begin driving on a limited basis once she is no longer taking tramadol during the day. There are no restrictions on her activities although she was advised to gradually build into new activities.  Plan:   Return in 4 weeks with CT of chest  Emily Nakayama, MD [facility] [phone]

## 2014-12-09 NOTE — Telephone Encounter (Signed)
Reached automated voice message stating "At the prescribers request this phone does not accept incoming calls.".

## 2014-12-09 NOTE — Telephone Encounter (Signed)
-----   Message from [name], MD sent at 12/04/2014  9:36 AM EDT ----- Please inform her that her labs reveal a normal white cell count, kidney function has improved. Chest x-ray is concerning for residual fluid and so I have ordered a CT chest.

## 2014-12-12 LAB — FUNGUS CULTURE W SMEAR
FUNGAL SMEAR: NONE SEEN
Fungal Smear: NONE SEEN
Fungal Smear: NONE SEEN

## 2014-12-12 NOTE — Telephone Encounter (Signed)
-----   Message from [name], MD sent at 12/04/2014  9:36 AM EDT ----- Please inform her that her labs reveal a normal white cell count, kidney function has improved. Chest x-ray is concerning for residual fluid and so I have ordered a CT chest.

## 2014-12-12 NOTE — Telephone Encounter (Signed)
Nurse called patient, reached voicemail. Left message for patient to call Seferina Brokaw at [phone].   

## 2014-12-15 NOTE — Telephone Encounter (Signed)
Nurse called patient, reached voicemail. Left message for patient to call Breah Joa at [phone].   

## 2014-12-15 NOTE — Telephone Encounter (Signed)
-----   Message from [name], MD sent at 12/04/2014  9:36 AM EDT ----- Please inform her that her labs reveal a normal white cell count, kidney function has improved. Chest x-ray is concerning for residual fluid and so I have ordered a CT chest.

## 2014-12-15 NOTE — Telephone Encounter (Signed)
Patient called nurse, patient aware of normal white cell count and improved kidney function. Patient aware of chest xray concerning for residual fluid and agrees to go to chest CT at [facility] on June 16 at 8am. Patient aware of no food or drink after midnight the night before CT scan. Patient voices understanding and has no questions at this time.

## 2014-12-17 ENCOUNTER — Ambulatory Visit: Payer: Medicaid Other | Admitting: Family Medicine

## 2014-12-18 ENCOUNTER — Ambulatory Visit: Payer: Medicaid Other | Admitting: Family Medicine

## 2014-12-22 ENCOUNTER — Ambulatory Visit: Payer: Self-pay

## 2014-12-25 ENCOUNTER — Ambulatory Visit (HOSPITAL_COMMUNITY): Admission: RE | Admit: 2014-12-25 | Payer: Medicaid Other | Source: Ambulatory Visit

## 2014-12-25 LAB — AFB CULTURE WITH SMEAR (NOT AT ARMC)
Acid Fast Smear: NONE SEEN
Special Requests: NORMAL

## 2014-12-25 NOTE — Progress Notes (Signed)
Quick Note:  Please inform the patient that her pleural fluid culture was negative for TB. Thank you. ______

## 2014-12-26 ENCOUNTER — Telehealth: Payer: Self-pay

## 2014-12-26 NOTE — Telephone Encounter (Signed)
Nurse called patient, patient verified date of birth. Patient aware of pleural fluid culture negative for TB. Patient voices understanding and has no questions at this time.

## 2014-12-26 NOTE — Telephone Encounter (Signed)
-----   Message from [name], MD sent at 12/25/2014  4:33 PM EDT ----- Please inform the patient that her pleural fluid culture was negative for TB. Thank you.

## 2014-12-28 LAB — AFB CULTURE WITH SMEAR (NOT AT ARMC)
ACID FAST SMEAR: NONE SEEN
ACID FAST SMEAR: NONE SEEN
ACID FAST SMEAR: NONE SEEN

## 2015-01-02 ENCOUNTER — Other Ambulatory Visit: Payer: Self-pay | Admitting: Thoracic Surgery (Cardiothoracic Vascular Surgery)

## 2015-01-02 DIAGNOSIS — J869 Pyothorax without fistula: Secondary | ICD-10-CM

## 2015-01-16 ENCOUNTER — Telehealth: Payer: Self-pay | Admitting: *Deleted

## 2015-01-16 NOTE — Telephone Encounter (Signed)
Left HIPAA compliant message for patient to return our call. 

## 2015-01-16 NOTE — Telephone Encounter (Signed)
-----   Message from [name], MD sent at 12/29/2014  4:15 PM EDT ----- Please inform the patient that her pleura (lining of the lung) came back negative for acid-fast bacilli.

## 2015-01-20 ENCOUNTER — Encounter: Payer: Self-pay | Admitting: Thoracic Surgery (Cardiothoracic Vascular Surgery)

## 2015-01-20 ENCOUNTER — Other Ambulatory Visit: Payer: Medicaid Other

## 2015-01-20 NOTE — Progress Notes (Signed)
This encounter was created in error - please disregard.

## 2015-01-30 ENCOUNTER — Encounter (HOSPITAL_COMMUNITY): Payer: Self-pay | Admitting: *Deleted

## 2015-01-30 ENCOUNTER — Emergency Department (HOSPITAL_COMMUNITY): Payer: Medicaid Other

## 2015-01-30 ENCOUNTER — Inpatient Hospital Stay (HOSPITAL_COMMUNITY)
Admission: EM | Admit: 2015-01-30 | Discharge: 2015-02-04 | DRG: 872 | Disposition: A | Discharge status: Home / Self Care | Payer: Medicaid Other | Attending: Internal Medicine | Admitting: Internal Medicine

## 2015-01-30 DIAGNOSIS — R197 Diarrhea, unspecified: Secondary | ICD-10-CM | POA: Diagnosis not present

## 2015-01-30 DIAGNOSIS — K529 Noninfective gastroenteritis and colitis, unspecified: Secondary | ICD-10-CM | POA: Diagnosis present

## 2015-01-30 DIAGNOSIS — I10 Essential (primary) hypertension: Secondary | ICD-10-CM | POA: Diagnosis present

## 2015-01-30 DIAGNOSIS — R112 Nausea with vomiting, unspecified: Secondary | ICD-10-CM | POA: Diagnosis present

## 2015-01-30 DIAGNOSIS — I159 Secondary hypertension, unspecified: Secondary | ICD-10-CM

## 2015-01-30 DIAGNOSIS — R1110 Vomiting, unspecified: Secondary | ICD-10-CM | POA: Diagnosis not present

## 2015-01-30 DIAGNOSIS — I509 Heart failure, unspecified: Secondary | ICD-10-CM | POA: Diagnosis present

## 2015-01-30 DIAGNOSIS — R000 Tachycardia, unspecified: Secondary | ICD-10-CM | POA: Diagnosis present

## 2015-01-30 DIAGNOSIS — Z87891 Personal history of nicotine dependence: Secondary | ICD-10-CM

## 2015-01-30 DIAGNOSIS — I2510 Atherosclerotic heart disease of native coronary artery without angina pectoris: Secondary | ICD-10-CM | POA: Diagnosis present

## 2015-01-30 DIAGNOSIS — Z9851 Tubal ligation status: Secondary | ICD-10-CM | POA: Diagnosis not present

## 2015-01-30 DIAGNOSIS — A419 Sepsis, unspecified organism: Secondary | ICD-10-CM | POA: Diagnosis present

## 2015-01-30 DIAGNOSIS — E876 Hypokalemia: Secondary | ICD-10-CM | POA: Diagnosis not present

## 2015-01-30 DIAGNOSIS — N179 Acute kidney failure, unspecified: Secondary | ICD-10-CM | POA: Diagnosis present

## 2015-01-30 DIAGNOSIS — R109 Unspecified abdominal pain: Secondary | ICD-10-CM

## 2015-01-30 HISTORY — DX: Nausea with vomiting, unspecified: R11.2

## 2015-01-30 HISTORY — DX: Diarrhea, unspecified: R19.7

## 2015-01-30 LAB — COMPREHENSIVE METABOLIC PANEL
ALBUMIN: 4.7 g/dL (ref 3.5–5.0)
ALT: 15 U/L (ref 14–54)
AST: 31 U/L (ref 15–41)
Alkaline Phosphatase: 75 U/L (ref 38–126)
Anion gap: 13 (ref 5–15)
BUN: 11 mg/dL (ref 6–20)
CALCIUM: 10.1 mg/dL (ref 8.9–10.3)
CO2: 23 mmol/L (ref 22–32)
Chloride: 103 mmol/L (ref 101–111)
Creatinine, Ser: 0.91 mg/dL (ref 0.44–1.00)
GFR calc non Af Amer: >60 mL/min (ref >60)
GLUCOSE: 188 mg/dL — AB (ref 65–99)
Potassium: 3.6 mmol/L (ref 3.5–5.1)
Sodium: 139 mmol/L (ref 135–145)
Total Bilirubin: 0.5 mg/dL (ref 0.3–1.2)
Total Protein: 9.7 g/dL — ABNORMAL HIGH (ref 6.5–8.1)

## 2015-01-30 LAB — CBC WITH DIFFERENTIAL/PLATELET
Basophils Absolute: 0.1 10*3/uL (ref 0.0–0.1)
Basophils Relative: 1 % (ref 0–1)
EOS ABS: 0.2 10*3/uL (ref 0.0–0.7)
EOS PCT: 1 % (ref 0–5)
HEMATOCRIT: 43.3 % (ref 36.0–46.0)
HEMOGLOBIN: 14.6 g/dL (ref 12.0–15.0)
Lymphocytes Relative: 18 % (ref 12–46)
Lymphs Abs: 2.7 10*3/uL (ref 0.7–4.0)
MCH: 26.9 pg (ref 26.0–34.0)
MCHC: 33.7 g/dL (ref 30.0–36.0)
MCV: 79.9 fL (ref 78.0–100.0)
Monocytes Absolute: 0.5 10*3/uL (ref 0.1–1.0)
Monocytes Relative: 3 % (ref 3–12)
NEUTROS ABS: 11.8 10*3/uL — AB (ref 1.7–7.7)
NEUTROS PCT: 77 % (ref 43–77)
Platelets: 381 10*3/uL (ref 150–400)
RBC: 5.42 MIL/uL — ABNORMAL HIGH (ref 3.87–5.11)
RDW: 16.4 % — AB (ref 11.5–15.5)
WBC: 15.2 10*3/uL — AB (ref 4.0–10.5)

## 2015-01-30 LAB — I-STAT BETA HCG BLOOD, ED (MC, WL, AP ONLY): I-stat hCG, quantitative: <5 m[IU]/mL (ref <5)

## 2015-01-30 LAB — I-STAT TROPONIN, ED: TROPONIN I, POC: 0.06 ng/mL (ref 0.00–0.08)

## 2015-01-30 LAB — LIPASE, BLOOD: Lipase: 18 U/L — ABNORMAL LOW (ref 22–51)

## 2015-01-30 MED ORDER — HYDRALAZINE HCL 50 MG PO TABS
100.0000 mg | ORAL_TABLET | Freq: Three times a day (TID) | ORAL | Status: DC
  Administered 2015-01-30: 100 mg via ORAL
  Filled 2015-01-30 (×3): qty 2

## 2015-01-30 MED ORDER — ACETAMINOPHEN 650 MG RE SUPP: 650.0000 mg | Freq: Four times a day (QID) | RECTAL | Status: DC | PRN

## 2015-01-30 MED ORDER — CLONIDINE HCL 0.2 MG PO TABS
0.2000 mg | ORAL_TABLET | Freq: Three times a day (TID) | ORAL | Status: DC
  Administered 2015-01-30: 0.2 mg via ORAL
  Filled 2015-01-30: qty 1

## 2015-01-30 MED ORDER — SODIUM CHLORIDE 0.9 % IJ SOLN
3.0000 mL | Freq: Two times a day (BID) | INTRAMUSCULAR | Status: DC
  Administered 2015-01-31 – 2015-02-04 (×6): 3 mL via INTRAVENOUS

## 2015-01-30 MED ORDER — LABETALOL HCL 300 MG PO TABS
300.0000 mg | ORAL_TABLET | Freq: Two times a day (BID) | ORAL | Status: DC
  Filled 2015-01-30: qty 1

## 2015-01-30 MED ORDER — IOHEXOL 300 MG/ML  SOLN
100.0000 mL | Freq: Once | INTRAMUSCULAR | Status: AC | PRN
  Administered 2015-01-30: 100 mL via INTRAVENOUS

## 2015-01-30 MED ORDER — METRONIDAZOLE IN NACL 5-0.79 MG/ML-% IV SOLN
500.0000 mg | Freq: Three times a day (TID) | INTRAVENOUS | Status: DC
  Administered 2015-01-30 – 2015-02-03 (×12): 500 mg via INTRAVENOUS
  Filled 2015-01-30 (×19): qty 100

## 2015-01-30 MED ORDER — ONDANSETRON HCL 4 MG/2ML IJ SOLN
4.0000 mg | Freq: Four times a day (QID) | INTRAMUSCULAR | Status: AC
  Administered 2015-01-30 – 2015-01-31 (×4): 4 mg via INTRAVENOUS
  Filled 2015-01-30 (×4): qty 2

## 2015-01-30 MED ORDER — CEFTRIAXONE SODIUM IN DEXTROSE 40 MG/ML IV SOLN
2.0000 g | INTRAVENOUS | Status: DC
  Administered 2015-01-30 – 2015-02-02 (×4): 2 g via INTRAVENOUS
  Filled 2015-01-30 (×5): qty 50

## 2015-01-30 MED ORDER — ONDANSETRON HCL 4 MG/2ML IJ SOLN
4.0000 mg | Freq: Once | INTRAMUSCULAR | Status: AC
Start: 2015-01-30 — End: 2015-01-30
  Administered 2015-01-30: 4 mg via INTRAVENOUS
  Filled 2015-01-30: qty 2

## 2015-01-30 MED ORDER — PROMETHAZINE HCL 25 MG/ML IJ SOLN
12.5000 mg | Freq: Four times a day (QID) | INTRAMUSCULAR | Status: DC | PRN
  Administered 2015-01-30: 12.5 mg via INTRAVENOUS
  Filled 2015-01-30: qty 1

## 2015-01-30 MED ORDER — HYDRALAZINE HCL 20 MG/ML IJ SOLN
10.0000 mg | Freq: Four times a day (QID) | INTRAMUSCULAR | Status: DC | PRN
  Administered 2015-01-31 – 2015-02-02 (×3): 10 mg via INTRAVENOUS
  Filled 2015-01-30 (×2): qty 1

## 2015-01-30 MED ORDER — SODIUM CHLORIDE 0.9 % IV SOLN
INTRAVENOUS | Status: DC
  Administered 2015-01-31 – 2015-02-01 (×2): via INTRAVENOUS

## 2015-01-30 MED ORDER — ACETAMINOPHEN 325 MG PO TABS
650.0000 mg | ORAL_TABLET | Freq: Four times a day (QID) | ORAL | Status: DC | PRN
  Administered 2015-02-02: 650 mg via ORAL
  Filled 2015-01-30: qty 2

## 2015-01-30 MED ORDER — CLONIDINE HCL 0.2 MG/24HR TD PTWK
0.2000 mg | MEDICATED_PATCH | TRANSDERMAL | Status: DC
  Administered 2015-01-30: 0.2 mg via TRANSDERMAL
  Filled 2015-01-30: qty 1

## 2015-01-30 MED ORDER — LABETALOL HCL 5 MG/ML IV SOLN: 20.0000 mg | INTRAVENOUS | Status: DC | PRN

## 2015-01-30 MED ORDER — HYDROCODONE-ACETAMINOPHEN 5-325 MG PO TABS
1.0000 | ORAL_TABLET | ORAL | Status: DC | PRN
  Administered 2015-01-31 – 2015-02-02 (×4): 2 via ORAL
  Filled 2015-01-30 (×4): qty 2

## 2015-01-30 MED ORDER — HYDROMORPHONE HCL 1 MG/ML IJ SOLN
1.0000 mg | INTRAMUSCULAR | Status: DC | PRN
  Administered 2015-01-30 – 2015-01-31 (×2): 1 mg via INTRAVENOUS
  Filled 2015-01-30 (×2): qty 1

## 2015-01-30 MED ORDER — HYDROMORPHONE HCL 1 MG/ML IJ SOLN
1.0000 mg | INTRAMUSCULAR | Status: DC | PRN
  Administered 2015-01-30 (×2): 1 mg via INTRAVENOUS
  Filled 2015-01-30 (×2): qty 1

## 2015-01-30 MED ORDER — AMLODIPINE BESYLATE 5 MG PO TABS
10.0000 mg | ORAL_TABLET | Freq: Every day | ORAL | Status: DC
  Administered 2015-01-30: 10 mg via ORAL
  Filled 2015-01-30: qty 2

## 2015-01-30 MED ORDER — HEPARIN SODIUM (PORCINE) 5000 UNIT/ML IJ SOLN
5000.0000 [IU] | Freq: Three times a day (TID) | INTRAMUSCULAR | Status: DC
  Administered 2015-01-30 – 2015-02-04 (×15): 5000 [IU] via SUBCUTANEOUS
  Filled 2015-01-30 (×18): qty 1

## 2015-01-30 MED ORDER — SODIUM CHLORIDE 0.9 % IV SOLN
1000.0000 mL | INTRAVENOUS | Status: DC
  Administered 2015-01-30 – 2015-01-31 (×2): 1000 mL via INTRAVENOUS

## 2015-01-30 MED ORDER — LABETALOL HCL 5 MG/ML IV SOLN
20.0000 mg | Freq: Once | INTRAVENOUS | Status: AC
  Administered 2015-01-30: 20 mg via INTRAVENOUS
  Filled 2015-01-30: qty 4

## 2015-01-30 MED ORDER — HYDRALAZINE HCL 20 MG/ML IJ SOLN
10.0000 mg | Freq: Three times a day (TID) | INTRAMUSCULAR | Status: DC
  Administered 2015-01-30 – 2015-02-02 (×7): 10 mg via INTRAVENOUS
  Filled 2015-01-30 (×3): qty 0.5
  Filled 2015-01-30 (×2): qty 1
  Filled 2015-01-30: qty 0.5
  Filled 2015-01-30: qty 1
  Filled 2015-01-30 (×2): qty 0.5
  Filled 2015-01-30: qty 1
  Filled 2015-01-30 (×3): qty 0.5
  Filled 2015-01-30: qty 1

## 2015-01-30 MED ORDER — HYDRALAZINE HCL 20 MG/ML IJ SOLN: 10.0000 mg | Freq: Three times a day (TID) | INTRAMUSCULAR | Status: DC

## 2015-01-30 MED ORDER — SODIUM CHLORIDE 0.9 % IV SOLN
1000.0000 mL | Freq: Once | INTRAVENOUS | Status: AC
  Administered 2015-01-30: 1000 mL via INTRAVENOUS

## 2015-01-30 MED ORDER — PROMETHAZINE HCL 25 MG/ML IJ SOLN
12.5000 mg | Freq: Once | INTRAMUSCULAR | Status: AC
  Administered 2015-01-30: 12.5 mg via INTRAVENOUS
  Filled 2015-01-30: qty 1

## 2015-01-30 MED ORDER — LABETALOL HCL 5 MG/ML IV SOLN: 10.0000 mg | INTRAVENOUS | Status: DC | PRN

## 2015-01-30 NOTE — H&P (Signed)
History and Physical        Hospital Admission Note Date: 01/30/2015  Patient name: Emily Key Medical record number: [id number] Date of birth: 02-07-1971 Age: 44 y.o. Gender: female  PCP: [name], MD  Referring physician: [email or web address]  Chief Complaint:  Intractable nausea, vomiting, diarrhea  HPI: Patient is a 44 year old female with CAD, hypertension presented to ED with intractable nausea, vomiting, abdominal cramps, diarrhea since last night. History was obtained from the patient reported that he she had dinner at [employer] last night around 9 PM and had a burger. Subsequently around 1 AM she started having intractable nausea, vomiting and abdominal cramps. She had multiple episodes of diarrhea since then. Denied any fevers or chills however feels diaphoretic. Described abdominal pain as crampy, 8 out of 10, worse in left lower quadrant. Denied any hematemesis, hematochezia or melena. Labs reviewed BMET unremarkable, WBC is 15.2, hemoglobin 14.6, troponin 0.06 CT abdomen showed small volume of free pelvic fluid greater than typically seen, likely due to enteritis  Review of Systems:  Constitutional: Denies fever, chills, diaphoresis,+ poor appetite and fatigue.  HEENT: Denies photophobia, eye pain, redness, hearing loss, ear pain, congestion, sore throat, rhinorrhea, sneezing, mouth sores, trouble swallowing, neck pain, neck stiffness and tinnitus.   Respiratory: Denies SOB, DOE, cough, chest tightness,  and wheezing.   Cardiovascular: Denies chest pain, palpitations and leg swelling.  Gastrointestinal please see history of present illness Genitourinary: Denies dysuria, urgency, frequency, hematuria, flank pain and difficulty urinating.  Musculoskeletal: Denies myalgias, back pain, joint swelling, arthralgias and gait problem.  Skin: Denies pallor, rash and wound.    Neurological: Denies dizziness, seizures, syncope, weakness, light-headedness, numbness and headaches.  Hematological: Denies adenopathy. Easy bruising, personal or family bleeding history  Psychiatric/Behavioral: Denies suicidal ideation, mood changes, confusion, nervousness, sleep disturbance and agitation  Past Medical History: Past Medical History  Diagnosis Date  . Coronary artery disease   . Hypertension   . CHF (congestive heart failure)     after last pregnancy    Past Surgical History  Procedure Laterality Date  . Tonsillectomy    . Tubal ligation    . Video assisted thoracoscopy (vats)/decortication Left 11/14/2014    Procedure: LEFT VIDEO ASSISTED THORACOSCOPY (VATS)/DECORTICATION;  Surgeon: Melrose Nakayama, MD;  Location: [place];  Service: Thoracic;  Laterality: Left;  . Pleural effusion drainage Left 11/14/2014    Procedure: DRAINAGE OF LEFT PLEURAL EFFUSION;  Surgeon: Melrose Nakayama, MD;  Location: [place];  Service: Thoracic;  Laterality: Left;    Medications: Prior to Admission medications   Medication Sig Start Date End Date Taking? Authorizing Provider  amLODipine (NORVASC) 10 MG tablet Take 1 tablet (10 mg total) by mouth daily. 11/22/14  Yes Kelvin Cellar, MD  cloNIDine (CATAPRES) 0.2 MG tablet Take 1 tablet (0.2 mg total) by mouth 3 (three) times daily. 11/22/14  Yes Kelvin Cellar, MD  hydrALAZINE (APRESOLINE) 100 MG tablet Take 1 tablet (100 mg total) by mouth every 8 (eight) hours. 11/22/14  Yes Kelvin Cellar, MD  labetalol (NORMODYNE) 300 MG tablet Take 1 tablet (300 mg total) by mouth 2 (two) times daily. 11/22/14  Yes Kelvin Cellar, MD  traMADol Veatrice Bourbon) 50  MG tablet Take 1 tablet (50 mg total) by mouth every 8 (eight) hours as needed. Patient not taking: Reported on 01/30/2015 12/09/14   Melrose Nakayama, MD    Allergies:  No Known Allergies  Social History:  reports that she quit smoking about 2 months ago. Her smoking use included  Cigarettes. She smoked 0.25 packs per day. She does not have any smokeless tobacco history on file. She reports that she drinks alcohol. She reports that she does not use illicit drugs.  Family History: Family History  Problem Relation Age of Onset  . Diabetes Mellitus II Mother   . Hypertension Mother   . Lung cancer Father     Physical Exam: Blood pressure 191/98, pulse 33, temperature 97.7 F (36.5 C), temperature source Oral, resp. rate 15, height 5\' 8"  (1.727 m), weight 88.451 kg (195 lb), last menstrual period 12/31/2014, SpO2 100 %. General: Alert, awake, oriented x3, in no acute distress. HEENT: normocephalic, atraumatic, anicteric sclera, pink conjunctiva, pupils equal and reactive to light and accomodation, oropharynx clear Neck: supple, no masses or lymphadenopathy, no goiter, no bruits  Heart: Regular rate and rhythm, without murmurs, rubs or gallops. Lungs: Clear to auscultation bilaterally, no wheezing, rales or rhonchi. Abdomen: Soft, mild diffuse tenderness worse in left lower quadrant , nondistended, positive bowel sounds, no masses. Extremities: No clubbing, cyanosis or edema with positive pedal pulses. Neuro: Grossly intact, no focal neurological deficits, strength 5/5 upper and lower extremities bilaterally Psych: alert and oriented x 3, normal mood and affect Skin: no rashes or lesions, warm and dry   LABS on Admission:  Basic Metabolic Panel:  Recent Labs Lab 01/30/15 1016  NA 139  K 3.6  CL 103  CO2 23  GLUCOSE 188*  BUN 11  CREATININE 0.91  CALCIUM 10.1   Liver Function Tests:  Recent Labs Lab 01/30/15 1016  AST 31  ALT 15  ALKPHOS 75  BILITOT 0.5  PROT 9.7*  ALBUMIN 4.7    Recent Labs Lab 01/30/15 1016  LIPASE 18*   No results for input(s): AMMONIA in the last 168 hours. CBC:  Recent Labs Lab 01/30/15 1016  WBC 15.2*  NEUTROABS 11.8*  HGB 14.6  HCT 43.3  MCV 79.9  PLT 381   Cardiac Enzymes: No results for input(s):  CKTOTAL, CKMB, CKMBINDEX, TROPONINI in the last 168 hours. BNP: Invalid input(s): POCBNP CBG: No results for input(s): GLUCAP in the last 168 hours.  Radiological Exams on Admission:  Ct Abdomen Pelvis W Contrast  01/30/2015   CLINICAL DATA:  Abdominal pain with nausea and vomiting beginning at 1:00 a.m. 01/30/2015.  EXAM: CT ABDOMEN AND PELVIS WITH CONTRAST  TECHNIQUE: Multidetector CT imaging of the abdomen and pelvis was performed using the standard protocol following bolus administration of intravenous contrast.  CONTRAST:  100 mL OMNIPAQUE IOHEXOL 300 MG/ML  SOLN  COMPARISON:  CT abdomen and pelvis 10/14/2013.  FINDINGS: Lung bases are clear. No pleural or pericardial effusion. Cardiomegaly noted.  Multiple small nonobstructing renal stones are seen bilaterally. There is no hydronephrosis on the right or left and no ureteral stones identified. The liver is low attenuating consistent with fatty infiltration. No focal liver lesion is identified. The gallbladder, adrenal glands, spleen, pancreas and biliary tree all appear normal.  There is a small volume of free pelvic fluid which is somewhat greater than typically seen in physiologic change. No focal fluid collection is identified. There is no lymphadenopathy. The patient is status post tubal ligation. The stomach, small  and large bowel and appendix all appear normal. No lytic or sclerotic bony lesion is identified.  IMPRESSION: Small volume of free pelvic fluid is slightly greater than typically seen in physiologic change. Although the bowel appears normal this finding could be due to enteritis.  Fatty infiltration of the liver.  Multiple bilateral nonobstructing renal stones.  Mild cardiomegaly.   Electronically Signed   By: Inge Rise M.D.   On: 01/30/2015 13:44   Dg Abd Acute W/chest  01/30/2015   CLINICAL DATA:  Acute generalized abdominal pain, vomiting.  EXAM: DG ABDOMEN ACUTE W/ 1V CHEST  COMPARISON:  Dec 09, 2014.  FINDINGS: There is no  evidence of dilated bowel loops or free intraperitoneal air. Stable bilateral nephrolithiasis. Heart size and mediastinal contours are within normal limits. Stable left basilar scarring is noted. No acute pulmonary disease is noted.  IMPRESSION: No evidence of bowel obstruction or ileus. Stable bilateral nephrolithiasis. No acute cardiopulmonary disease.   Electronically Signed   By: Marijo Conception, M.D.   On: 01/30/2015 11:34    *I have personally reviewed the images above*  EKG: Independently reviewed. Sinus tachycardia with prolonged QTC 505   Assessment/Plan Principal Problem:   Intractable nausea and vomiting, abdominal pain and diarrhea likely due to enteritis - NPO status, aggressive IV fluid hydration, stool studies including GI pathogen panel, fecal lactoferrin, stool cultures - Placed on IV Rocephin and Flagyl for now (avoid fluoroquinolones due to prolonged QTC) - Placed on scheduled Zofran and Phenergan as needed for refractory nausea  Active Problems:    Accelerated hypertension - Due to #1, patient unable to hold anything down, placed on scheduled IV hydralazine with labetalol as needed with parameters, clonidine patch   DVT prophylaxis: Heparin subcutaneous  CODE STATUS: Full CODE STATUS  Family Communication: Admission, patients condition and plan of care including tests being ordered have been discussed with the patient and sister who indicates understanding and agree with the plan and Code Status  Disposition plan: Further plan will depend as patient's clinical course evolves and further radiologic and laboratory data become available.   Time Spent on Admission: 60 mins   Keniya Schlotterbeck M.D. [facility] 01/30/2015, 3:35 PM Pager: [phone]  If 7PM-7AM, please contact night-coverage [email or web address] Password [email or web address]

## 2015-01-30 NOTE — ED Notes (Signed)
Requested meds from pharmacy  

## 2015-01-30 NOTE — ED Notes (Signed)
Pt off unit with xray 

## 2015-01-30 NOTE — ED Notes (Signed)
Pt ate at [employer] late last night around 1:00AM and within the hour, pt began vomiting and diarreah at least 10 times each throughout the night.  Pt presents diaphoretic and painful in her stomach 10/10.

## 2015-01-30 NOTE — ED Provider Notes (Signed)
CSN: [id number]     Arrival date & time 01/30/15  0919 History   First MD Initiated Contact with Patient 01/30/15 [phone]     Chief Complaint  Patient presents with  . Emesis     Patient is a 44 y.o. female presenting with vomiting. The history is provided by the patient.  Emesis Duration:  1 day Timing:  Constant Number of daily episodes:  Too numerous to count, all night long Quality:  Stomach contents Progression:  Worsening Chronicity:  New Relieved by:  Nothing Associated symptoms: abdominal pain and diarrhea   Associated symptoms: no cough, no fever and no URI   Abdominal pain:    Location:  Generalized   Quality:  Aching   Severity:  Severe   Onset quality:  Gradual   Timing:  Constant Diarrhea:    Diarrhea characteristics: no blood    Number of occurrences:  Numerous    Severity:  Severe   Past Medical History  Diagnosis Date  . Coronary artery disease   . Hypertension   . CHF (congestive heart failure)     after last pregnancy   Past Surgical History  Procedure Laterality Date  . Tonsillectomy    . Tubal ligation    . Video assisted thoracoscopy (vats)/decortication Left 11/14/2014    Procedure: LEFT VIDEO ASSISTED THORACOSCOPY (VATS)/DECORTICATION;  Surgeon: Melrose Nakayama, MD;  Location: [place];  Service: Thoracic;  Laterality: Left;  . Pleural effusion drainage Left 11/14/2014    Procedure: DRAINAGE OF LEFT PLEURAL EFFUSION;  Surgeon: Melrose Nakayama, MD;  Location: [facility] OR;  Service: Thoracic;  Laterality: Left;   Family History  Problem Relation Age of Onset  . Diabetes Mellitus II Mother   . Hypertension Mother   . Lung cancer Father    History  Substance Use Topics  . Smoking status: Former Smoker -- 0.25 packs/day    Types: Cigarettes    Quit date: 11/11/2014  . Smokeless tobacco: Not on file  . Alcohol Use: 0.0 oz/week    0 Standard drinks or equivalent per week     Comment: occassionally   OB History    No data available      Review of Systems  Gastrointestinal: Positive for vomiting, abdominal pain and diarrhea.  All other systems reviewed and are negative.     Allergies  Review of patient's allergies indicates no known allergies.  Home Medications   Prior to Admission medications   Medication Sig Start Date End Date Taking? Authorizing Provider  amLODipine (NORVASC) 10 MG tablet Take 1 tablet (10 mg total) by mouth daily. 11/22/14  Yes Kelvin Cellar, MD  cloNIDine (CATAPRES) 0.2 MG tablet Take 1 tablet (0.2 mg total) by mouth 3 (three) times daily. 11/22/14  Yes Kelvin Cellar, MD  hydrALAZINE (APRESOLINE) 100 MG tablet Take 1 tablet (100 mg total) by mouth every 8 (eight) hours. 11/22/14  Yes Kelvin Cellar, MD  labetalol (NORMODYNE) 300 MG tablet Take 1 tablet (300 mg total) by mouth 2 (two) times daily. 11/22/14  Yes Kelvin Cellar, MD  traMADol (ULTRAM) 50 MG tablet Take 1 tablet (50 mg total) by mouth every 8 (eight) hours as needed. Patient not taking: Reported on 01/30/2015 12/09/14   Melrose Nakayama, MD   BP 203/92 mmHg  Pulse 116  Temp(Src) 97.7 F (36.5 C) (Oral)  Resp 37  Ht 5\' 8"  (1.727 m)  Wt 195 lb (88.451 kg)  BMI 29.66 kg/m2  SpO2 100%  LMP 12/31/2014 Physical Exam  Constitutional: She appears well-developed and well-nourished. She appears distressed.  HENT:  Head: Normocephalic and atraumatic.  Right Ear: External ear normal.  Left Ear: External ear normal.  Eyes: Conjunctivae are normal. Right eye exhibits no discharge. Left eye exhibits no discharge. No scleral icterus.  Neck: Neck supple. No tracheal deviation present.  Cardiovascular: Normal rate, regular rhythm and intact distal pulses.   Pulmonary/Chest: Effort normal and breath sounds normal. No stridor. No respiratory distress. She has no wheezes. She has no rales.  Abdominal: Soft. Bowel sounds are normal. She exhibits no distension and no mass. There is tenderness in the epigastric area. There is guarding.  There is no rigidity and no rebound. No hernia.  Musculoskeletal: She exhibits no edema or tenderness.  Neurological: She is alert. She has normal strength. No cranial nerve deficit (no facial droop, extraocular movements intact, no slurred speech) or sensory deficit. She exhibits normal muscle tone. She displays no seizure activity. Coordination normal.  Skin: Skin is warm. No rash noted. She is diaphoretic.  Psychiatric: She has a normal mood and affect.  Nursing note and vitals reviewed.   ED Course  Procedures (including critical care time) Labs Review Labs Reviewed  CBC WITH DIFFERENTIAL/PLATELET - Abnormal; Notable for the following:    WBC 15.2 (*)    RBC 5.42 (*)    RDW 16.4 (*)    Neutro Abs 11.8 (*)    All other components within normal limits  COMPREHENSIVE METABOLIC PANEL - Abnormal; Notable for the following:    Glucose, Bld 188 (*)    Total Protein 9.7 (*)    All other components within normal limits  LIPASE, BLOOD - Abnormal; Notable for the following:    Lipase 18 (*)    All other components within normal limits  I-STAT BETA HCG BLOOD, ED ([facility], [facility], [facility] ONLY)  I-STAT TROPOININ, ED    Imaging Review Ct Abdomen Pelvis W Contrast  01/30/2015   CLINICAL DATA:  Abdominal pain with nausea and vomiting beginning at 1:00 a.m. 01/30/2015.  EXAM: CT ABDOMEN AND PELVIS WITH CONTRAST  TECHNIQUE: Multidetector CT imaging of the abdomen and pelvis was performed using the standard protocol following bolus administration of intravenous contrast.  CONTRAST:  100 mL OMNIPAQUE IOHEXOL 300 MG/ML  SOLN  COMPARISON:  CT abdomen and pelvis 10/14/2013.  FINDINGS: Lung bases are clear. No pleural or pericardial effusion. Cardiomegaly noted.  Multiple small nonobstructing renal stones are seen bilaterally. There is no hydronephrosis on the right or left and no ureteral stones identified. The liver is low attenuating consistent with fatty infiltration. No focal liver lesion is identified. The  gallbladder, adrenal glands, spleen, pancreas and biliary tree all appear normal.  There is a small volume of free pelvic fluid which is somewhat greater than typically seen in physiologic change. No focal fluid collection is identified. There is no lymphadenopathy. The patient is status post tubal ligation. The stomach, small and large bowel and appendix all appear normal. No lytic or sclerotic bony lesion is identified.  IMPRESSION: Small volume of free pelvic fluid is slightly greater than typically seen in physiologic change. Although the bowel appears normal this finding could be due to enteritis.  Fatty infiltration of the liver.  Multiple bilateral nonobstructing renal stones.  Mild cardiomegaly.   Electronically Signed   By: Inge Rise M.D.   On: 01/30/2015 13:44   Dg Abd Acute W/chest  01/30/2015   CLINICAL DATA:  Acute generalized abdominal pain, vomiting.  EXAM: DG ABDOMEN ACUTE  W/ 1V CHEST  COMPARISON:  Dec 09, 2014.  FINDINGS: There is no evidence of dilated bowel loops or free intraperitoneal air. Stable bilateral nephrolithiasis. Heart size and mediastinal contours are within normal limits. Stable left basilar scarring is noted. No acute pulmonary disease is noted.  IMPRESSION: No evidence of bowel obstruction or ileus. Stable bilateral nephrolithiasis. No acute cardiopulmonary disease.   Electronically Signed   By: Marijo Conception, M.D.   On: 01/30/2015 11:34     EKG Interpretation None     Medications  0.9 %  sodium chloride infusion (0 mLs Intravenous Stopped 01/30/15 1122)    Followed by  0.9 %  sodium chloride infusion (1,000 mLs Intravenous New Bag/Given 01/30/15 1044)  HYDROmorphone (DILAUDID) injection 1 mg (1 mg Intravenous Given 01/30/15 1350)  cloNIDine (CATAPRES) tablet 0.2 mg (0.2 mg Oral Given 01/30/15 1349)  hydrALAZINE (APRESOLINE) tablet 100 mg (100 mg Oral Given 01/30/15 1447)  labetalol (NORMODYNE) tablet 300 mg (not administered)  amLODipine (NORVASC) tablet 10  mg (10 mg Oral Given 01/30/15 1349)  ondansetron (ZOFRAN) injection 4 mg (4 mg Intravenous Given 01/30/15 1006)  promethazine (PHENERGAN) injection 12.5 mg (12.5 mg Intravenous Given 01/30/15 1355)  iohexol (OMNIPAQUE) 300 MG/ML solution 100 mL (100 mLs Intravenous Contrast Given 01/30/15 1316)    MDM   Final diagnoses:  Abdominal pain, unspecified abdominal location  Non-intractable vomiting with nausea, vomiting of unspecified type  Secondary hypertension, unspecified    Patient required several doses of pain medications and antinausea medications. She has had some improvement but she continues to have nausea and some pain. The scan does not show any evidence of obstruction or acute surgical process.  Patient has elevated blood pressure. She denies any trouble with shortness of breath or chest pain. She was not able to take her blood pressure medications because of her nausea and vomiting.  Because of her persistent symptoms, continued elevated blood pressure I will consult with medical service regarding admission for observation and further treatment.    Dorie Rank, MD 01/30/15 [phone]

## 2015-01-30 NOTE — ED Notes (Signed)
Pt ambulated to bathroom 

## 2015-01-30 NOTE — ED Notes (Signed)
Pt returned to from from CT and ambulated to the bathroom.  Called pharmacy for pt meds.

## 2015-01-30 NOTE — ED Notes (Signed)
Patient transported to CT 

## 2015-01-30 NOTE — ED Notes (Signed)
Pt belongings sent to floor in bag:  Flip flops, shorts, shirt & cell phone

## 2015-01-31 LAB — LACTIC ACID, PLASMA: Lactic Acid, Venous: 1.9 mmol/L (ref 0.5–2.0)

## 2015-01-31 LAB — CBC
HEMATOCRIT: 42.6 % (ref 36.0–46.0)
HEMOGLOBIN: 14.3 g/dL (ref 12.0–15.0)
MCH: 26.5 pg (ref 26.0–34.0)
MCHC: 33.6 g/dL (ref 30.0–36.0)
MCV: 78.9 fL (ref 78.0–100.0)
PLATELETS: 377 10*3/uL (ref 150–400)
RBC: 5.4 MIL/uL — ABNORMAL HIGH (ref 3.87–5.11)
RDW: 16.7 % — AB (ref 11.5–15.5)
WBC: 13.4 10*3/uL — AB (ref 4.0–10.5)

## 2015-01-31 LAB — BASIC METABOLIC PANEL
ANION GAP: 11 (ref 5–15)
BUN: 14 mg/dL (ref 6–20)
CHLORIDE: 101 mmol/L (ref 101–111)
CO2: 26 mmol/L (ref 22–32)
CREATININE: 1.1 mg/dL — AB (ref 0.44–1.00)
Calcium: 9.3 mg/dL (ref 8.9–10.3)
GFR calc Af Amer: >60 mL/min (ref >60)
GFR calc non Af Amer: 60 mL/min — ABNORMAL LOW (ref >60)
Glucose, Bld: 125 mg/dL — ABNORMAL HIGH (ref 65–99)
Potassium: 3.6 mmol/L (ref 3.5–5.1)
Sodium: 138 mmol/L (ref 135–145)

## 2015-01-31 LAB — PROCALCITONIN: Procalcitonin: <0.1 ng/mL

## 2015-01-31 MED ORDER — METOPROLOL TARTRATE 1 MG/ML IV SOLN
5.0000 mg | Freq: Four times a day (QID) | INTRAVENOUS | Status: DC | PRN
  Administered 2015-01-31: 5 mg via INTRAVENOUS
  Filled 2015-01-31: qty 5

## 2015-01-31 MED ORDER — HYDROMORPHONE HCL 1 MG/ML IJ SOLN
1.0000 mg | INTRAMUSCULAR | Status: DC | PRN
  Administered 2015-01-31 (×4): 2 mg via INTRAVENOUS
  Administered 2015-02-01: 1 mg via INTRAVENOUS
  Filled 2015-01-31 (×4): qty 2
  Filled 2015-01-31: qty 1

## 2015-01-31 MED ORDER — PROMETHAZINE HCL 25 MG/ML IJ SOLN
25.0000 mg | INTRAMUSCULAR | Status: DC | PRN
  Administered 2015-02-01: 25 mg via INTRAVENOUS
  Filled 2015-01-31: qty 1

## 2015-01-31 MED ORDER — LABETALOL HCL 100 MG PO TABS
100.0000 mg | ORAL_TABLET | Freq: Two times a day (BID) | ORAL | Status: DC
  Administered 2015-01-31 – 2015-02-04 (×9): 100 mg via ORAL
  Filled 2015-01-31 (×12): qty 1

## 2015-01-31 MED ORDER — CLONIDINE HCL 0.2 MG PO TABS
0.2000 mg | ORAL_TABLET | Freq: Two times a day (BID) | ORAL | Status: DC
  Administered 2015-01-31 – 2015-02-04 (×9): 0.2 mg via ORAL
  Filled 2015-01-31 (×12): qty 1

## 2015-01-31 NOTE — Progress Notes (Signed)
01/31/2015 10:55 AM  Central telemetry notified this nurse that the patient has converted to a new rhythm, bigeminy,. Informed Myers MD. Will continue to assess and monitor the patient.   [employer], RN-BC, [employer] [facility] [address] Phone [phone]

## 2015-01-31 NOTE — Progress Notes (Signed)
01/31/2015 9:25 AM  Central telemetry sent an alert that the patient heart rate is running between 140s-180s. Notified Doyle Askew MD. New orders given. Medication given. Check eMAR.  [employer], RN-BC, [employer] [facility] [address] Phone [phone]

## 2015-01-31 NOTE — Progress Notes (Signed)
Utilization Review completed. Eldena Dede RN BSN CM 

## 2015-01-31 NOTE — Progress Notes (Signed)
Patient ID: Emily Key, female   DOB: [birth year], 44 y.o.   MRN: [id number]  [facility] PROGRESS NOTE  Autumnrose Yore [id number]:[id number] DOB: 09-22-1970 DOA: 01/30/2015 PCP: [name], MD   Brief narrative:    44 year old female with CAD, hypertension presented to ED with intractable nausea, vomiting, abdominal cramps, diarrhea 24 hours in duration. Had dinner at [employer] several hours prior, denies similar events in the past.   Assessment/Plan:    Principal Problem:   Intractable nausea and vomiting/diarrhea  - possibly related to enteritis - still uncomfortable this AM - continue to provide supportive care with IVF, analgesia and antiemetics as needed  - stool GI panel requested  - continue rocephin and flagyl day #2  Active Problems:   Sepsis secondary to ? Enteritis - pt met criteria for sepsis, HR up to 116, WBC 15, source intra abdominal  - stool studies pending - continue rocephin and flagyl day #2 - check lactic acid and procalcitonin  - WBC trending down    Acute renal failure - from pre renal etiology - continue IVF, avoid nephrotoxins - repeat BMP in AM      Accelerated hypertension - place on hydralazine scheduled and as needed  - continue labetalol, clonidine PO    Tachycardia - likely from pain - monitor on tele - provide metoprolol as needed - continue IVF   DVT prophylaxis - heparin SQ  Code Status: Full.  Family Communication:  plan of care discussed with the patient Disposition Plan: Home when stable.   IV access:  Peripheral IV  Procedures and diagnostic studies:    Ct Abdomen Pelvis W Contrast 01/30/2015  Small volume of free pelvic fluid is slightly greater than typically seen in physiologic change. Although the bowel appears normal this finding could be due to enteritis.  Fatty infiltration of the liver.  Multiple bilateral nonobstructing renal stones.    Dg Abd Acute W/chest 01/30/2015   No evidence of bowel obstruction or ileus.  Stable bilateral nephrolithiasis. No acute cardiopulmonary disease.    Medical Consultants:  None  Other Consultants:  None  IAnti-Infectives:   Rocephin 7/22 --> Flagyl 7/22 -->  Faye Ramsay, MD  [facility] Pager [phone]  If 7PM-7AM, please contact night-coverage [email or web address] Password [email or web address] 01/31/2015, 1:26 PM   LOS: 1 day   HPI/Subjective: No events overnight. Pt still in pain, abd area, 10/10  Objective: Filed Vitals:   01/31/15 0010 01/31/15 0412 01/31/15 0859 01/31/15 0941  BP: 134/87 154/67 202/99 152/82  Pulse: 88 46 59 68  Temp: 97.9 F (36.6 C) 98.2 F (36.8 C) 97.7 F (36.5 C) 97.7 F (36.5 C)  TempSrc: Oral Oral Oral Oral  Resp: _0 Height:      Weight:      SpO2: 98% 99% 100% 99%    Intake/Output Summary (Last 24 hours) at 01/31/15 1326 Last data filed at 01/31/15 1312  Gross per 24 hour  Intake 1896.75 ml  Output    700 ml  Net 1196.75 ml    Exam:   General:  Pt is alert, follows commands appropriately, not in acute distress  Cardiovascular: Regular rhythm, tachycardic, S1/S2, no murmurs, no rubs, no gallops  Respiratory: Clear to auscultation bilaterally, no wheezing, no crackles, no rhonchi  Abdomen: Soft, tender in epigastric area, non distended, bowel sounds present, no guarding  Extremities: No edema, pulses DP and PT palpable bilaterally  Neuro: Grossly nonfocal  Data Reviewed: Basic Metabolic Panel:  Recent Labs Lab 01/30/15 1016 01/31/15  0325  NA 139 138  K 3.6 3.6  CL 103 101  CO2 23 26  GLUCOSE 188* 125*  BUN 11 14  CREATININE 0.91 1.10*  CALCIUM 10.1 9.3   Liver Function Tests:  Recent Labs Lab 01/30/15 1016  AST 31  ALT 15  ALKPHOS 75  BILITOT 0.5  PROT 9.7*  ALBUMIN 4.7    Recent Labs Lab 01/30/15 1016  LIPASE 18*   CBC:  Recent Labs Lab 01/30/15 1016 01/31/15 0325  WBC 15.2* 13.4*  NEUTROABS 11.8*  --   HGB 14.6 14.3  HCT 43.3 42.6  MCV 79.9 78.9  PLT 381 377     Scheduled Meds: . cefTRIAXone (ROCEPHIN)  IV  2 g Intravenous Q24H  . cloNIDine  0.2 mg Transdermal Weekly  . heparin  5,000 Units Subcutaneous 3 times per day  . hydrALAZINE  10 mg Intravenous 3 times per day  . metronidazole  500 mg Intravenous Q8H  . sodium chloride  3 mL Intravenous Q12H   Continuous Infusions: . sodium chloride 1,000 mL (01/31/15 0852)  . sodium chloride 125 mL/hr at 01/31/15 0006

## 2015-02-01 LAB — CBC
HEMATOCRIT: 35.8 % — AB (ref 36.0–46.0)
Hemoglobin: 11.7 g/dL — ABNORMAL LOW (ref 12.0–15.0)
MCH: 26.7 pg (ref 26.0–34.0)
MCHC: 32.7 g/dL (ref 30.0–36.0)
MCV: 81.7 fL (ref 78.0–100.0)
PLATELETS: 267 10*3/uL (ref 150–400)
RBC: 4.38 MIL/uL (ref 3.87–5.11)
RDW: 17.2 % — ABNORMAL HIGH (ref 11.5–15.5)
WBC: 7.4 10*3/uL (ref 4.0–10.5)

## 2015-02-01 LAB — BASIC METABOLIC PANEL
ANION GAP: 10 (ref 5–15)
BUN: 24 mg/dL — ABNORMAL HIGH (ref 6–20)
CHLORIDE: 106 mmol/L (ref 101–111)
CO2: 25 mmol/L (ref 22–32)
CREATININE: 1.05 mg/dL — AB (ref 0.44–1.00)
Calcium: 9.2 mg/dL (ref 8.9–10.3)
GFR calc Af Amer: >60 mL/min (ref >60)
GFR calc non Af Amer: >60 mL/min (ref >60)
Glucose, Bld: 104 mg/dL — ABNORMAL HIGH (ref 65–99)
Potassium: 3.7 mmol/L (ref 3.5–5.1)
Sodium: 141 mmol/L (ref 135–145)

## 2015-02-01 LAB — URINE CULTURE

## 2015-02-01 NOTE — Progress Notes (Signed)
Patient ID: Emily Key, female   DOB: 04-20-1971, 44 y.o.   MRN: [id number]  [facility] PROGRESS NOTE  Emily Key [id number]:[id number] DOB: Nov 16, 1970 DOA: 01/30/2015 PCP: [name], MD   Brief narrative:    44 year old female with CAD, hypertension presented to ED with intractable nausea, vomiting, abdominal cramps, diarrhea 24 hours in duration. Had dinner at [employer] several hours prior, denies similar events in the past.   Assessment/Plan:    Principal Problem:   Intractable nausea and vomiting/diarrhea  - possibly related to enteritis - still uncomfortable this AM but better compared to yesterday  - continue to provide supportive care with IVF, analgesia and antiemetics as needed  - stool GI panel requested and still pending  - continue rocephin and flagyl day #3  Active Problems:   Sepsis secondary to ? Enteritis - pt met criteria for sepsis, HR up to 116, WBC 15, source intra abdominal  - stool studies pending - continue rocephin and flagyl day #3 - WBC trending down and WNL this AM    Acute renal failure - from pre renal etiology - continue IVF, avoid nephrotoxins - Cr is trending down  - repeat BMP in AM      Accelerated hypertension - place on hydralazine scheduled and as needed  - continue labetalol, clonidine PO    Tachycardia - likely from pain - monitor on tele - provide metoprolol as needed - continue IVF   DVT prophylaxis - heparin SQ  Code Status: Full.  Family Communication:  plan of care discussed with the patient Disposition Plan: Home when stable.   IV access:  Peripheral IV  Procedures and diagnostic studies:    Ct Abdomen Pelvis W Contrast 01/30/2015  Small volume of free pelvic fluid is slightly greater than typically seen in physiologic change. Although the bowel appears normal this finding could be due to enteritis.  Fatty infiltration of the liver.  Multiple bilateral nonobstructing renal stones.    Dg Abd Acute  W/chest 01/30/2015   No evidence of bowel obstruction or ileus. Stable bilateral nephrolithiasis. No acute cardiopulmonary disease.    Medical Consultants:  None  Other Consultants:  None  IAnti-Infectives:   Rocephin 7/22 --> Flagyl 7/22 -->  Faye Ramsay, MD  [facility] Pager [phone]  If 7PM-7AM, please contact night-coverage [email or web address] Password [email or web address] 02/01/2015, 8:05 AM   LOS: 2 days   HPI/Subjective: No events overnight. Pt still in pain, abd area, 3/10  Objective: Filed Vitals:   01/31/15 1713 01/31/15 2018 01/31/15 2250 02/01/15 0432  BP: 148/75 160/67  116/70  Pulse: 99 55 74 79  Temp: 97.8 F (36.6 C) 97.7 F (36.5 C)  98.3 F (36.8 C)  TempSrc: Oral Oral  Oral  Resp: _0 Height:      Weight:  89.6 kg (197 lb 8.5 oz)    SpO2: 98% 98%  99%    Intake/Output Summary (Last 24 hours) at 02/01/15 0805 Last data filed at 02/01/15 0640  Gross per 24 hour  Intake 3766.74 ml  Output    850 ml  Net 2916.74 ml    Exam:   General:  Pt is alert, follows commands appropriately, not in acute distress  Cardiovascular: Regular rhythm, tachycardic, S1/S2, no murmurs, no rubs, no gallops  Respiratory: Clear to auscultation bilaterally, no wheezing, no crackles, no rhonchi  Abdomen: Soft, tender in epigastric area, non distended, bowel sounds present, no guarding  Extremities: No edema, pulses DP and PT palpable bilaterally  Neuro: Grossly  nonfocal  Data Reviewed: Basic Metabolic Panel:  Recent Labs Lab 01/30/15 1016 01/31/15 0325 02/01/15 0555  NA 139 138 141  K 3.6 3.6 3.7  CL 103 101 106  CO2 _0 GLUCOSE 188* 125* 104*  BUN 11 14 24*  CREATININE 0.91 1.10* 1.05*  CALCIUM 10.1 9.3 9.2   Liver Function Tests:  Recent Labs Lab 01/30/15 1016  AST 31  ALT 15  ALKPHOS 75  BILITOT 0.5  PROT 9.7*  ALBUMIN 4.7    Recent Labs Lab 01/30/15 1016  LIPASE 18*   CBC:  Recent Labs Lab 01/30/15 1016 01/31/15 0325  02/01/15 0555  WBC 15.2* 13.4* 7.4  NEUTROABS 11.8*  --   --   HGB 14.6 14.3 11.7*  HCT 43.3 42.6 35.8*  MCV 79.9 78.9 81.7  PLT 381 377 267    Scheduled Meds: . cefTRIAXone (ROCEPHIN)  IV  2 g Intravenous Q24H  . cloNIDine  0.2 mg Oral BID  . heparin  5,000 Units Subcutaneous 3 times per day  . hydrALAZINE  10 mg Intravenous 3 times per day  . labetalol  100 mg Oral BID  . metronidazole  500 mg Intravenous Q8H  . sodium chloride  3 mL Intravenous Q12H   Continuous Infusions: . sodium chloride 1,000 mL (01/31/15 0852)  . sodium chloride 125 mL/hr at 01/31/15 0006

## 2015-02-02 LAB — BASIC METABOLIC PANEL
Anion gap: 8 (ref 5–15)
BUN: 20 mg/dL (ref 6–20)
CO2: 22 mmol/L (ref 22–32)
CREATININE: 0.99 mg/dL (ref 0.44–1.00)
Calcium: 8.9 mg/dL (ref 8.9–10.3)
Chloride: 111 mmol/L (ref 101–111)
GFR calc Af Amer: >60 mL/min (ref >60)
GFR calc non Af Amer: >60 mL/min (ref >60)
Glucose, Bld: 89 mg/dL (ref 65–99)
Potassium: 3.1 mmol/L — ABNORMAL LOW (ref 3.5–5.1)
Sodium: 141 mmol/L (ref 135–145)

## 2015-02-02 LAB — CBC
HCT: 34.7 % — ABNORMAL LOW (ref 36.0–46.0)
Hemoglobin: 11.3 g/dL — ABNORMAL LOW (ref 12.0–15.0)
MCH: 26.5 pg (ref 26.0–34.0)
MCHC: 32.6 g/dL (ref 30.0–36.0)
MCV: 81.5 fL (ref 78.0–100.0)
Platelets: 243 10*3/uL (ref 150–400)
RBC: 4.26 MIL/uL (ref 3.87–5.11)
RDW: 17.1 % — ABNORMAL HIGH (ref 11.5–15.5)
WBC: 5.9 10*3/uL (ref 4.0–10.5)

## 2015-02-02 LAB — PROCALCITONIN: Procalcitonin: <0.1 ng/mL

## 2015-02-02 MED ORDER — HYDRALAZINE HCL 25 MG PO TABS
25.0000 mg | ORAL_TABLET | Freq: Three times a day (TID) | ORAL | Status: DC
  Administered 2015-02-02 – 2015-02-04 (×6): 25 mg via ORAL
  Filled 2015-02-02 (×11): qty 1

## 2015-02-02 MED ORDER — POTASSIUM CHLORIDE CRYS ER 20 MEQ PO TBCR
40.0000 meq | EXTENDED_RELEASE_TABLET | Freq: Once | ORAL | Status: DC
  Filled 2015-02-02 (×2): qty 2

## 2015-02-02 MED ORDER — SODIUM CHLORIDE 0.9 % IV SOLN
1000.0000 mL | INTRAVENOUS | Status: DC
  Administered 2015-02-02: 1000 mL via INTRAVENOUS

## 2015-02-02 NOTE — Progress Notes (Signed)
Patient ID: Emily Key, female   DOB: September 12, 1970, 44 y.o.   MRN: [id number]  [facility] PROGRESS NOTE  Emily Key MLM:[id number] DOB: Mar 12, 1971 DOA: 01/30/2015 PCP: Jaclyn Shaggy, MD   Brief narrative:    44 year old female with CAD, hypertension presented to ED with intractable nausea, vomiting, abdominal cramps, diarrhea 24 hours in duration. Had dinner at [employer] several hours prior, denies similar events in the past.   Assessment/Plan:    Principal Problem:   Intractable nausea and vomiting/diarrhea  - possibly related to enteritis - reports feeling better, wants diet advanced  - continue to provide supportive care with IVF, analgesia and antiemetics as needed  - stool GI panel requested and still pending  - continue rocephin and flagyl day #4  Active Problems:   Sepsis secondary to ? Enteritis - pt met criteria for sepsis, HR up to 116, WBC 15, source intra abdominal  - stool studies pending - continue rocephin and flagyl day #4 - WBC trending down and WNL this AM    Acute renal failure - from pre renal etiology - continue IVF, avoid nephrotoxins - Cr is trending down and WNL this AM - repeat BMP in AM      Accelerated hypertension - continue hydralazine scheduled and as needed  - continue labetalol, clonidine PO    Tachycardia - likely from pain - resolved, d/c tele  - provide metoprolol as needed  DVT prophylaxis - heparin SQ  Code Status: Full.  Family Communication:  plan of care discussed with the patient Disposition Plan: Home when stable.   IV access:  Peripheral IV  Procedures and diagnostic studies:    Ct Abdomen Pelvis W Contrast 01/30/2015  Small volume of free pelvic fluid is slightly greater than typically seen in physiologic change. Although the bowel appears normal this finding could be due to enteritis.  Fatty infiltration of the liver.  Multiple bilateral nonobstructing renal stones.    Dg Abd Acute W/chest 01/30/2015   No  evidence of bowel obstruction or ileus. Stable bilateral nephrolithiasis. No acute cardiopulmonary disease.    Medical Consultants:  None  Other Consultants:  None  IAnti-Infectives:   Rocephin 7/22 --> Flagyl 7/22 -->  Debbora Presto, MD  [facility] Pager [phone]  If 7PM-7AM, please contact night-coverage [email or web address] Password [email or web address] 02/02/2015, 1:07 PM   LOS: 3 days   HPI/Subjective: No events overnight. Pt still in pain, abd area, 2/10  Objective: Filed Vitals:   02/01/15 1732 02/01/15 2050 02/02/15 0453 02/02/15 1000  BP: 148/59 169/74 150/63 178/79  Pulse: 76 85 63 63  Temp: 98.4 F (36.9 C) 98.6 F (37 C) 98.6 F (37 C) 98.1 F (36.7 C)  TempSrc: Oral Oral Oral Oral  Resp: 18 17 16 18   Height:      Weight:  92.1 kg (203 lb 0.7 oz)    SpO2: 100% 98% 100% 100%    Intake/Output Summary (Last 24 hours) at 02/02/15 1307 Last data filed at 02/02/15 0900  Gross per 24 hour  Intake    920 ml  Output    950 ml  Net    -30 ml    Exam:   General:  Pt is alert, follows commands appropriately, not in acute distress  Cardiovascular: Regular rhythm, tachycardic, S1/S2, no murmurs, no rubs, no gallops  Respiratory: Clear to auscultation bilaterally, no wheezing, no crackles, no rhonchi  Abdomen: Soft, tender in epigastric area, non distended, bowel sounds present, no guarding  Extremities: No edema, pulses DP and PT  palpable bilaterally  Neuro: Grossly nonfocal  Data Reviewed: Basic Metabolic Panel:  Recent Labs Lab 01/30/15 1016 01/31/15 0325 02/01/15 0555 02/02/15 0544  NA 139 138 141 141  K 3.6 3.6 3.7 3.1*  CL 103 101 106 111  CO2 $Re'23 26 25 22  'QUq$ GLUCOSE 188* 125* 104* 89  BUN 11 14 24* 20  CREATININE 0.91 1.10* 1.05* 0.99  CALCIUM 10.1 9.3 9.2 8.9   Liver Function Tests:  Recent Labs Lab 01/30/15 1016  AST 31  ALT 15  ALKPHOS 75  BILITOT 0.5  PROT 9.7*  ALBUMIN 4.7    Recent Labs Lab 01/30/15 1016  LIPASE 18*   CBC:  Recent  Labs Lab 01/30/15 1016 01/31/15 0325 02/01/15 0555 02/02/15 0544  WBC 15.2* 13.4* 7.4 5.9  NEUTROABS 11.8*  --   --   --   HGB 14.6 14.3 11.7* 11.3*  HCT 43.3 42.6 35.8* 34.7*  MCV 79.9 78.9 81.7 81.5  PLT 381 377 267 243    Scheduled Meds: . cefTRIAXone (ROCEPHIN)  IV  2 g Intravenous Q24H  . cloNIDine  0.2 mg Oral BID  . heparin  5,000 Units Subcutaneous 3 times per day  . hydrALAZINE  10 mg Intravenous 3 times per day  . labetalol  100 mg Oral BID  . metronidazole  500 mg Intravenous Q8H  . sodium chloride  3 mL Intravenous Q12H   Continuous Infusions: . sodium chloride 1,000 mL (01/31/15 0852)

## 2015-02-03 LAB — BASIC METABOLIC PANEL
Anion gap: 8 (ref 5–15)
BUN: 11 mg/dL (ref 6–20)
CALCIUM: 8.6 mg/dL — AB (ref 8.9–10.3)
CO2: 26 mmol/L (ref 22–32)
CREATININE: 0.87 mg/dL (ref 0.44–1.00)
Chloride: 103 mmol/L (ref 101–111)
GFR calc Af Amer: >60 mL/min (ref >60)
GLUCOSE: 77 mg/dL (ref 65–99)
Potassium: 3.1 mmol/L — ABNORMAL LOW (ref 3.5–5.1)
Sodium: 137 mmol/L (ref 135–145)

## 2015-02-03 LAB — CBC
HEMATOCRIT: 33.6 % — AB (ref 36.0–46.0)
HEMOGLOBIN: 11 g/dL — AB (ref 12.0–15.0)
MCH: 26.5 pg (ref 26.0–34.0)
MCHC: 32.7 g/dL (ref 30.0–36.0)
MCV: 81 fL (ref 78.0–100.0)
PLATELETS: 262 10*3/uL (ref 150–400)
RBC: 4.15 MIL/uL (ref 3.87–5.11)
RDW: 16.7 % — AB (ref 11.5–15.5)
WBC: 5.8 10*3/uL (ref 4.0–10.5)

## 2015-02-03 MED ORDER — METRONIDAZOLE 500 MG PO TABS
500.0000 mg | ORAL_TABLET | Freq: Three times a day (TID) | ORAL | Status: DC
Start: 2015-02-03 — End: 2015-02-04
  Administered 2015-02-03 – 2015-02-04 (×3): 500 mg via ORAL
  Filled 2015-02-03 (×6): qty 1

## 2015-02-03 MED ORDER — CIPROFLOXACIN HCL 500 MG PO TABS
500.0000 mg | ORAL_TABLET | Freq: Two times a day (BID) | ORAL | Status: DC
  Administered 2015-02-03 – 2015-02-04 (×3): 500 mg via ORAL
  Filled 2015-02-03 (×5): qty 1

## 2015-02-03 MED ORDER — POLYETHYLENE GLYCOL 3350 17 G PO PACK
17.0000 g | PACK | Freq: Every day | ORAL | Status: DC
Start: 2015-02-03 — End: 2015-02-04
  Administered 2015-02-03: 17 g via ORAL
  Filled 2015-02-03 (×2): qty 1

## 2015-02-03 MED ORDER — SENNOSIDES-DOCUSATE SODIUM 8.6-50 MG PO TABS
1.0000 | ORAL_TABLET | Freq: Two times a day (BID) | ORAL | Status: DC
Start: 2015-02-03 — End: 2015-02-04
  Administered 2015-02-03 – 2015-02-04 (×3): 1 via ORAL
  Filled 2015-02-03 (×4): qty 1

## 2015-02-03 MED ORDER — POTASSIUM CHLORIDE CRYS ER 20 MEQ PO TBCR
40.0000 meq | EXTENDED_RELEASE_TABLET | Freq: Once | ORAL | Status: AC
  Administered 2015-02-03: 40 meq via ORAL
  Filled 2015-02-03: qty 2

## 2015-02-03 NOTE — Progress Notes (Signed)
Patient ID: Emily Key, female   DOB: 01/26/1971, 44 y.o.   MRN: [id number]  [facility] PROGRESS NOTE  Jeannene Tschetter FHX:[id number] DOB: [birth year] DOA: 01/30/2015 PCP: Jaclyn Shaggy, MD   Brief narrative:    44 year old female with CAD, hypertension presented to ED with intractable nausea, vomiting, abdominal cramps, diarrhea 24 hours in duration. Had dinner at [employer] several hours prior, denies similar events in the past.   Assessment/Plan:    Principal Problem:   Intractable nausea and vomiting/diarrhea  - possibly related to enteritis - reports feeling better, still on clears - has not had BM over the past 3 days  - continue rocephin and flagyl day #5/7, will transition to oral cipro and flagyl today  - of pt tolerating well and tolerating regular diet, possible d/c in AM  Active Problems:   Sepsis secondary to ? Enteritis - pt met criteria for sepsis, HR up to 116, WBC 15, source intra abdominal  - continue rocephin and flagyl day #5/7, transition to PO as noted above  - WBC trending down and WNL this AM    Acute renal failure - from pre renal etiology - Cr is trending down with IVF and WNL this AM - stop IVF today  - repeat BMP in AM    Hypokalemia - will need to supplement and repeat BMP in AM      Accelerated hypertension - continue hydralazine scheduled and as needed  - continue labetalol, clonidine PO    Tachycardia - likely from pain - resolved, d/c tele  - provide metoprolol as needed  DVT prophylaxis - heparin SQ  Code Status: Full.  Family Communication:  plan of care discussed with the patient Disposition Plan: Home in AM  IV access:  Peripheral IV  Procedures and diagnostic studies:    Ct Abdomen Pelvis W Contrast 01/30/2015  Small volume of free pelvic fluid is slightly greater than typically seen in physiologic change. Although the bowel appears normal this finding could be due to enteritis.  Fatty infiltration of the liver.  Multiple  bilateral nonobstructing renal stones.    Dg Abd Acute W/chest 01/30/2015   No evidence of bowel obstruction or ileus. Stable bilateral nephrolithiasis. No acute cardiopulmonary disease.    Medical Consultants:  None  Other Consultants:  None  IAnti-Infectives:   Rocephin 7/22 --> 7/28 Flagyl 7/22 --> 7/28  Debbora Presto, MD  [facility] Pager [phone]  If 7PM-7AM, please contact night-coverage [email or web address] Password [email or web address] 02/03/2015, 1:01 PM   LOS: 4 days   HPI/Subjective: No events overnight. Pt still in pain, abd area, 2/10  Objective: Filed Vitals:   02/02/15 1806 02/02/15 2130 02/03/15 0447 02/03/15 0937  BP: 155/74 189/78 164/69 166/94  Pulse: 66 66 61   Temp: 98.2 F (36.8 C) 97.7 F (36.5 C) 97.8 F (36.6 C) 98.2 F (36.8 C)  TempSrc: Oral Oral Oral Oral  Resp: 18 20 18 18   Height:      Weight:  92 kg (202 lb 13.2 oz)    SpO2: 100% 99% 100% 100%    Intake/Output Summary (Last 24 hours) at 02/03/15 1301 Last data filed at 02/03/15 0600  Gross per 24 hour  Intake 1978.33 ml  Output   1650 ml  Net 328.33 ml    Exam:   General:  Pt is alert, follows commands appropriately, not in acute distress  Cardiovascular: Regular rhythm, tachycardic, S1/S2, no murmurs, no rubs, no gallops  Respiratory: Clear to auscultation bilaterally, no wheezing, no crackles, no rhonchi  Abdomen: Soft, tender in epigastric area, non distended, bowel sounds present, no guarding  Extremities: No edema, pulses DP and PT palpable bilaterally  Neuro: Grossly nonfocal  Data Reviewed: Basic Metabolic Panel:  Recent Labs Lab 01/30/15 1016 01/31/15 0325 02/01/15 0555 02/02/15 0544 02/03/15 0511  NA 139 138 141 141 137  K 3.6 3.6 3.7 3.1* 3.1*  CL 103 101 106 111 103  CO2 $Re'23 26 25 22 26  'Mlr$ GLUCOSE 188* 125* 104* 89 77  BUN 11 14 24* 20 11  CREATININE 0.91 1.10* 1.05* 0.99 0.87  CALCIUM 10.1 9.3 9.2 8.9 8.6*   Liver Function Tests:  Recent Labs Lab 01/30/15 1016   AST 31  ALT 15  ALKPHOS 75  BILITOT 0.5  PROT 9.7*  ALBUMIN 4.7    Recent Labs Lab 01/30/15 1016  LIPASE 18*   CBC:  Recent Labs Lab 01/30/15 1016 01/31/15 0325 02/01/15 0555 02/02/15 0544 02/03/15 0511  WBC 15.2* 13.4* 7.4 5.9 5.8  NEUTROABS 11.8*  --   --   --   --   HGB 14.6 14.3 11.7* 11.3* 11.0*  HCT 43.3 42.6 35.8* 34.7* 33.6*  MCV 79.9 78.9 81.7 81.5 81.0  PLT 381 377 267 243 262    Scheduled Meds: . cefTRIAXone (ROCEPHIN)  IV  2 g Intravenous Q24H  . cloNIDine  0.2 mg Oral BID  . heparin  5,000 Units Subcutaneous 3 times per day  . hydrALAZINE  25 mg Oral 3 times per day  . labetalol  100 mg Oral BID  . metronidazole  500 mg Intravenous Q8H  . potassium chloride  40 mEq Oral Once  . sodium chloride  3 mL Intravenous Q12H   Continuous Infusions: . sodium chloride 1,000 mL (02/02/15 1502)

## 2015-02-03 NOTE — Progress Notes (Signed)
As per M.D. Today,it is o.k. For her to discontinued the enteric precautions,as it was been discontinued  Last night.

## 2015-02-04 DIAGNOSIS — E876 Hypokalemia: Secondary | ICD-10-CM

## 2015-02-04 DIAGNOSIS — R197 Diarrhea, unspecified: Secondary | ICD-10-CM

## 2015-02-04 DIAGNOSIS — I10 Essential (primary) hypertension: Secondary | ICD-10-CM

## 2015-02-04 DIAGNOSIS — R1110 Vomiting, unspecified: Secondary | ICD-10-CM

## 2015-02-04 LAB — CBC
HEMATOCRIT: 33.3 % — AB (ref 36.0–46.0)
HEMOGLOBIN: 11.1 g/dL — AB (ref 12.0–15.0)
MCH: 26.6 pg (ref 26.0–34.0)
MCHC: 33.3 g/dL (ref 30.0–36.0)
MCV: 79.9 fL (ref 78.0–100.0)
Platelets: 237 10*3/uL (ref 150–400)
RBC: 4.17 MIL/uL (ref 3.87–5.11)
RDW: 16.4 % — ABNORMAL HIGH (ref 11.5–15.5)
WBC: 6.1 10*3/uL (ref 4.0–10.5)

## 2015-02-04 LAB — OVA AND PARASITE EXAMINATION: OVA AND PARASITES: NONE SEEN

## 2015-02-04 LAB — BASIC METABOLIC PANEL
ANION GAP: 4 — AB (ref 5–15)
BUN: 5 mg/dL — ABNORMAL LOW (ref 6–20)
CO2: 27 mmol/L (ref 22–32)
Calcium: 8.9 mg/dL (ref 8.9–10.3)
Chloride: 106 mmol/L (ref 101–111)
Creatinine, Ser: 0.91 mg/dL (ref 0.44–1.00)
GFR calc Af Amer: >60 mL/min (ref >60)
GFR calc non Af Amer: >60 mL/min (ref >60)
Glucose, Bld: 104 mg/dL — ABNORMAL HIGH (ref 65–99)
Potassium: 3.2 mmol/L — ABNORMAL LOW (ref 3.5–5.1)
Sodium: 137 mmol/L (ref 135–145)

## 2015-02-04 LAB — FECAL LACTOFERRIN, QUANT: Fecal Lactoferrin: NEGATIVE

## 2015-02-04 LAB — PROCALCITONIN

## 2015-02-04 MED ORDER — CIPROFLOXACIN HCL 500 MG PO TABS: 500.0000 mg | ORAL_TABLET | Freq: Two times a day (BID) | ORAL | Status: DC

## 2015-02-04 MED ORDER — HYDRALAZINE HCL 50 MG PO TABS: 50.0000 mg | ORAL_TABLET | Freq: Three times a day (TID) | ORAL | Status: DC

## 2015-02-04 MED ORDER — LABETALOL HCL 100 MG PO TABS: 100.0000 mg | ORAL_TABLET | Freq: Two times a day (BID) | ORAL | Status: DC

## 2015-02-04 MED ORDER — METRONIDAZOLE 500 MG PO TABS: 500.0000 mg | ORAL_TABLET | Freq: Three times a day (TID) | ORAL | Status: DC

## 2015-02-04 MED ORDER — POTASSIUM CHLORIDE CRYS ER 20 MEQ PO TBCR
40.0000 meq | EXTENDED_RELEASE_TABLET | Freq: Once | ORAL | Status: AC
  Administered 2015-02-04: 40 meq via ORAL

## 2015-02-04 NOTE — Discharge Summary (Signed)
Physician Discharge Summary  Emily Key LYY:[id number] DOB: June 28, 1971 DOA: 01/30/2015  PCP: [name], MD  Admit date: 01/30/2015 Discharge date: 02/04/2015  Time spent: 45 minutes  Recommendations for Outpatient Follow-up:  Patient will be discharged to home.  Patient will need to follow up with primary care provider within one week of discharge, and have repeat BMP.  Patient should continue medications as prescribed.  Patient should follow a heart healthy diet.   Discharge Diagnoses:  Intractable nausea and vomiting/diarrhea Sepsis secondary to questionable enteritis Acute renal failure Hypokalemia Accelerated hypertension Tachycardia  Discharge Condition: Stable  Diet recommendation: Heart healthy  Filed Weights   02/01/15 2050 02/02/15 2130 02/03/15 2033  Weight: 92.1 kg (203 lb 0.7 oz) 92 kg (202 lb 13.2 oz) 89.495 kg (197 lb 4.8 oz)    History of present illness:  On 01/30/2015 by Dr. [name] Patient is a 44 year old female with CAD, hypertension presented to ED with intractable nausea, vomiting, abdominal cramps, diarrhea since last night. History was obtained from the patient reported that he she had dinner at [employer] last night around 9 PM and had a burger. Subsequently around 1 AM she started having intractable nausea, vomiting and abdominal cramps. She had multiple episodes of diarrhea since then. Denied any fevers or chills however feels diaphoretic. Described abdominal pain as crampy, 8 out of 10, worse in left lower quadrant. Denied any hematemesis, hematochezia or melena. Labs reviewed BMET unremarkable, WBC is 15.2, hemoglobin 14.6, troponin 0.06 CT abdomen showed small volume of free pelvic fluid greater than typically seen, likely due to enteritis  Hospital Course:  Intractable nausea and vomiting/diarrhea  -possibly related to enteritis -Patient denies further diarrhea. -Patient was able to tolerate liquid diet and was transitioned to a regular diet,  tolerated well. -continue rocephin and flagyl day #5/7 -On 02/03/2015, patient was transitioned to oral Cipro and Flagyl -Will continue for 7 days total, therefore patient will only require 2 more days of therapy as an outpatient.   Sepsis secondary to ? Enteritis -Upon admission, pt met criteria for sepsis, HR up to 116, WBC 15, source intra abdominal  -Treatment and plan as above -Leukocytosis resolved, vital signs stable  Acute renal failure -Resolved, likely secondary to pre renal etiology -Creatinine currently 0.91   Hypokalemia -Replaced, would recommend patient have repeat BMP within 1 week of discharge.    Accelerated hypertension -continue hydralazine, labetalol, clonidine PO -Patient should have close follow-up with her primary care physician for better control of her blood pressure -Continue amlodipine at discharge -Hydralazine and labetalol dosages were changed  Tachycardia -Likely secondary to pain versus sepsis -Resolved  Procedures:  None  Consultations:  None  Discharge Exam: Filed Vitals:   02/04/15 0413  BP: 162/78  Pulse: 58  Temp: 98 F (36.7 C)  Resp: 18     General: Well developed, well nourished, NAD, appears stated age  HEENT: NCAT, mucous membranes moist.  Cardiovascular: S1 S2 auscultated, no rubs, murmurs or gallops. Regular rate and rhythm.  Respiratory: Clear to auscultation bilaterally with equal chest rise  Abdomen: Soft, nontender, nondistended, + bowel sounds  Extremities: warm dry without cyanosis clubbing or edema  Neuro: AAOx3,nonfocal  Psych: Normal affect and demeanor with intact judgement and insight  Discharge Instructions      Discharge Instructions    Discharge instructions    Complete by:  As directed   Patient will be discharged to home.  Patient will need to follow up with primary care provider within one week  of discharge, and have repeat BMP.  Patient should continue medications as prescribed.   Patient should follow a heart healthy diet.            Medication List    STOP taking these medications        traMADol 50 MG tablet  Commonly known as:  ULTRAM      TAKE these medications        amLODipine 10 MG tablet  Commonly known as:  NORVASC  Take 1 tablet (10 mg total) by mouth daily.     ciprofloxacin 500 MG tablet  Commonly known as:  CIPRO  Take 1 tablet (500 mg total) by mouth 2 (two) times daily.     cloNIDine 0.2 MG tablet  Commonly known as:  CATAPRES  Take 1 tablet (0.2 mg total) by mouth 3 (three) times daily.     hydrALAZINE 50 MG tablet  Commonly known as:  APRESOLINE  Take 1 tablet (50 mg total) by mouth every 8 (eight) hours.     labetalol 100 MG tablet  Commonly known as:  NORMODYNE  Take 1 tablet (100 mg total) by mouth 2 (two) times daily.     metroNIDAZOLE 500 MG tablet  Commonly known as:  FLAGYL  Take 1 tablet (500 mg total) by mouth every 8 (eight) hours.       No Known Allergies Follow-up Information    Follow up with [name], MD. Schedule an appointment as soon as possible for a visit in 1 week.   Specialty:  Family Medicine   Why:  Hospital follow up, repeat BMP   Contact information:   [place] [address] [phone]        The results of significant diagnostics from this hospitalization (including imaging, microbiology, ancillary and laboratory) are listed below for reference.    Significant Diagnostic Studies: Ct Abdomen Pelvis W Contrast  01/30/2015   CLINICAL DATA:  Abdominal pain with nausea and vomiting beginning at 1:00 a.m. 01/30/2015.  EXAM: CT ABDOMEN AND PELVIS WITH CONTRAST  TECHNIQUE: Multidetector CT imaging of the abdomen and pelvis was performed using the standard protocol following bolus administration of intravenous contrast.  CONTRAST:  100 mL OMNIPAQUE IOHEXOL 300 MG/ML  SOLN  COMPARISON:  CT abdomen and pelvis 10/14/2013.  FINDINGS: Lung bases are clear. No pleural or  pericardial effusion. Cardiomegaly noted.  Multiple small nonobstructing renal stones are seen bilaterally. There is no hydronephrosis on the right or left and no ureteral stones identified. The liver is low attenuating consistent with fatty infiltration. No focal liver lesion is identified. The gallbladder, adrenal glands, spleen, pancreas and biliary tree all appear normal.  There is a small volume of free pelvic fluid which is somewhat greater than typically seen in physiologic change. No focal fluid collection is identified. There is no lymphadenopathy. The patient is status post tubal ligation. The stomach, small and large bowel and appendix all appear normal. No lytic or sclerotic bony lesion is identified.  IMPRESSION: Small volume of free pelvic fluid is slightly greater than typically seen in physiologic change. Although the bowel appears normal this finding could be due to enteritis.  Fatty infiltration of the liver.  Multiple bilateral nonobstructing renal stones.  Mild cardiomegaly.   Electronically Signed   By: Inge Rise M.D.   On: 01/30/2015 13:44   Dg Abd Acute W/chest  01/30/2015   CLINICAL DATA:  Acute generalized abdominal pain, vomiting.  EXAM: DG ABDOMEN ACUTE W/ 1V  CHEST  COMPARISON:  Dec 09, 2014.  FINDINGS: There is no evidence of dilated bowel loops or free intraperitoneal air. Stable bilateral nephrolithiasis. Heart size and mediastinal contours are within normal limits. Stable left basilar scarring is noted. No acute pulmonary disease is noted.  IMPRESSION: No evidence of bowel obstruction or ileus. Stable bilateral nephrolithiasis. No acute cardiopulmonary disease.   Electronically Signed   By: Marijo Conception, M.D.   On: 01/30/2015 11:34    Microbiology: Recent Results (from the past 240 hour(s))  Urine culture     Status: None   Collection Time: 01/31/15  9:15 AM  Result Value Ref Range Status   Specimen Description URINE, RANDOM  Final   Special Requests NONE  Final     Culture 3,000 COLONIES/mL INSIGNIFICANT GROWTH  Final   Report Status 02/01/2015 FINAL  Final     Labs: Basic Metabolic Panel:  Recent Labs Lab 01/31/15 0325 02/01/15 0555 02/02/15 0544 02/03/15 0511 02/04/15 0522  NA 138 141 141 137 137  K 3.6 3.7 3.1* 3.1* 3.2*  CL 101 106 111 103 106  CO2 $Re'26 25 22 26 27  'UJN$ GLUCOSE 125* 104* 89 77 104*  BUN 14 24* 20 11 5*  CREATININE 1.10* 1.05* 0.99 0.87 0.91  CALCIUM 9.3 9.2 8.9 8.6* 8.9   Liver Function Tests:  Recent Labs Lab 01/30/15 1016  AST 31  ALT 15  ALKPHOS 75  BILITOT 0.5  PROT 9.7*  ALBUMIN 4.7    Recent Labs Lab 01/30/15 1016  LIPASE 18*   No results for input(s): AMMONIA in the last 168 hours. CBC:  Recent Labs Lab 01/30/15 1016 01/31/15 0325 02/01/15 0555 02/02/15 0544 02/03/15 0511 02/04/15 0522  WBC 15.2* 13.4* 7.4 5.9 5.8 6.1  NEUTROABS 11.8*  --   --   --   --   --   HGB 14.6 14.3 11.7* 11.3* 11.0* 11.1*  HCT 43.3 42.6 35.8* 34.7* 33.6* 33.3*  MCV 79.9 78.9 81.7 81.5 81.0 79.9  PLT 381 377 267 243 262 237   Cardiac Enzymes: No results for input(s): CKTOTAL, CKMB, CKMBINDEX, TROPONINI in the last 168 hours. BNP: BNP (last 3 results)  Recent Labs  11/12/14 0355  BNP 138.6*    ProBNP (last 3 results) No results for input(s): PROBNP in the last 8760 hours.  CBG: No results for input(s): GLUCAP in the last 168 hours.     SignedCristal Ford  [facility] 02/04/2015, 10:30 AM

## 2015-02-04 NOTE — Discharge Instructions (Signed)

## 2015-02-04 NOTE — Care Management Note (Signed)
Case Management Note  Patient Details  Name: Emily Key MRN: [id number] Date of Birth: 20-Jan-1971  Subjective/Objective:              CM following for progression and d/c planning.      Action/Plan: No d/c needs identified.  Expected Discharge Date:       02/04/2015           Expected Discharge Plan:  Home/Self Care  In-House Referral:  NA  Discharge planning Services  NA  Post Acute Care Choice:  NA Choice offered to:  NA  DME Arranged:    DME Agency:     HH Arranged:    HH Agency:     Status of Service:  Completed, signed off  Medicare Important Message Given:    Date Medicare IM Given:    Medicare IM give by:    Date Additional Medicare IM Given:    Additional Medicare Important Message give by:     If discussed at [place] of Stay Meetings, dates discussed:    Additional Comments:  Adron Bene, RN 02/04/2015, 3:02 PM

## 2015-02-04 NOTE — Progress Notes (Signed)
Patient's discharged papers and instructions explained and given to the patient.Questions answered to patient' satisfaction.Reminded of her pending medical appointment.

## 2015-02-07 LAB — GI PATHOGEN PANEL BY PCR, STOOL
C difficile toxin A/B: NOT DETECTED
Campylobacter by PCR: NOT DETECTED
Cryptosporidium by PCR: NOT DETECTED
E coli (ETEC) LT/ST: NOT DETECTED
E coli (STEC): NOT DETECTED
E coli 0157 by PCR: NOT DETECTED
G lamblia by PCR: NOT DETECTED
NOROVIRUS G1/G2: NOT DETECTED
ROTAVIRUS A BY PCR: NOT DETECTED
SALMONELLA BY PCR: NOT DETECTED
Shigella by PCR: NOT DETECTED

## 2015-02-07 LAB — STOOL CULTURE

## 2015-02-09 ENCOUNTER — Inpatient Hospital Stay (HOSPITAL_COMMUNITY): Payer: Medicaid Other

## 2015-02-09 ENCOUNTER — Emergency Department (HOSPITAL_COMMUNITY): Payer: Medicaid Other

## 2015-02-09 ENCOUNTER — Encounter (HOSPITAL_COMMUNITY): Payer: Self-pay | Admitting: *Deleted

## 2015-02-09 ENCOUNTER — Inpatient Hospital Stay (HOSPITAL_COMMUNITY)
Admission: EM | Admit: 2015-02-09 | Discharge: 2015-02-15 | DRG: 872 | Disposition: A | Discharge status: Home / Self Care | Payer: Medicaid Other | Attending: Internal Medicine | Admitting: Internal Medicine

## 2015-02-09 DIAGNOSIS — K8010 Calculus of gallbladder with chronic cholecystitis without obstruction: Secondary | ICD-10-CM | POA: Diagnosis present

## 2015-02-09 DIAGNOSIS — A419 Sepsis, unspecified organism: Principal | ICD-10-CM | POA: Diagnosis present

## 2015-02-09 DIAGNOSIS — I10 Essential (primary) hypertension: Secondary | ICD-10-CM | POA: Diagnosis not present

## 2015-02-09 DIAGNOSIS — I2510 Atherosclerotic heart disease of native coronary artery without angina pectoris: Secondary | ICD-10-CM | POA: Diagnosis present

## 2015-02-09 DIAGNOSIS — N189 Chronic kidney disease, unspecified: Secondary | ICD-10-CM | POA: Diagnosis present

## 2015-02-09 DIAGNOSIS — R109 Unspecified abdominal pain: Secondary | ICD-10-CM | POA: Diagnosis not present

## 2015-02-09 DIAGNOSIS — N941 Dyspareunia: Secondary | ICD-10-CM | POA: Diagnosis not present

## 2015-02-09 DIAGNOSIS — N83209 Unspecified ovarian cyst, unspecified side: Secondary | ICD-10-CM

## 2015-02-09 DIAGNOSIS — E872 Acidosis, unspecified: Secondary | ICD-10-CM | POA: Diagnosis present

## 2015-02-09 DIAGNOSIS — N7011 Chronic salpingitis: Secondary | ICD-10-CM | POA: Diagnosis not present

## 2015-02-09 DIAGNOSIS — N8320 Unspecified ovarian cysts: Secondary | ICD-10-CM | POA: Diagnosis present

## 2015-02-09 DIAGNOSIS — R51 Headache: Secondary | ICD-10-CM | POA: Diagnosis present

## 2015-02-09 DIAGNOSIS — I160 Hypertensive urgency: Secondary | ICD-10-CM | POA: Diagnosis present

## 2015-02-09 DIAGNOSIS — N179 Acute kidney failure, unspecified: Secondary | ICD-10-CM | POA: Diagnosis present

## 2015-02-09 DIAGNOSIS — K5900 Constipation, unspecified: Secondary | ICD-10-CM | POA: Diagnosis not present

## 2015-02-09 DIAGNOSIS — I129 Hypertensive chronic kidney disease with stage 1 through stage 4 chronic kidney disease, or unspecified chronic kidney disease: Secondary | ICD-10-CM | POA: Diagnosis present

## 2015-02-09 DIAGNOSIS — R079 Chest pain, unspecified: Secondary | ICD-10-CM | POA: Diagnosis present

## 2015-02-09 DIAGNOSIS — R7989 Other specified abnormal findings of blood chemistry: Secondary | ICD-10-CM | POA: Diagnosis not present

## 2015-02-09 DIAGNOSIS — R112 Nausea with vomiting, unspecified: Secondary | ICD-10-CM | POA: Diagnosis present

## 2015-02-09 DIAGNOSIS — F1210 Cannabis abuse, uncomplicated: Secondary | ICD-10-CM | POA: Diagnosis present

## 2015-02-09 DIAGNOSIS — L0291 Cutaneous abscess, unspecified: Secondary | ICD-10-CM | POA: Diagnosis present

## 2015-02-09 DIAGNOSIS — E876 Hypokalemia: Secondary | ICD-10-CM | POA: Diagnosis present

## 2015-02-09 DIAGNOSIS — R6520 Severe sepsis without septic shock: Secondary | ICD-10-CM | POA: Diagnosis present

## 2015-02-09 DIAGNOSIS — I493 Ventricular premature depolarization: Secondary | ICD-10-CM | POA: Diagnosis not present

## 2015-02-09 DIAGNOSIS — I252 Old myocardial infarction: Secondary | ICD-10-CM | POA: Diagnosis not present

## 2015-02-09 DIAGNOSIS — Z87891 Personal history of nicotine dependence: Secondary | ICD-10-CM

## 2015-02-09 DIAGNOSIS — K449 Diaphragmatic hernia without obstruction or gangrene: Secondary | ICD-10-CM | POA: Diagnosis present

## 2015-02-09 DIAGNOSIS — R197 Diarrhea, unspecified: Secondary | ICD-10-CM

## 2015-02-09 DIAGNOSIS — R0609 Other forms of dyspnea: Secondary | ICD-10-CM | POA: Diagnosis present

## 2015-02-09 DIAGNOSIS — J869 Pyothorax without fistula: Secondary | ICD-10-CM | POA: Diagnosis present

## 2015-02-09 DIAGNOSIS — R1013 Epigastric pain: Secondary | ICD-10-CM

## 2015-02-09 DIAGNOSIS — R1084 Generalized abdominal pain: Secondary | ICD-10-CM | POA: Diagnosis not present

## 2015-02-09 DIAGNOSIS — I214 Non-ST elevation (NSTEMI) myocardial infarction: Secondary | ICD-10-CM | POA: Diagnosis not present

## 2015-02-09 DIAGNOSIS — Z79899 Other long term (current) drug therapy: Secondary | ICD-10-CM

## 2015-02-09 DIAGNOSIS — F1910 Other psychoactive substance abuse, uncomplicated: Secondary | ICD-10-CM | POA: Diagnosis present

## 2015-02-09 DIAGNOSIS — J852 Abscess of lung without pneumonia: Secondary | ICD-10-CM | POA: Diagnosis present

## 2015-02-09 DIAGNOSIS — N888 Other specified noninflammatory disorders of cervix uteri: Secondary | ICD-10-CM | POA: Diagnosis not present

## 2015-02-09 DIAGNOSIS — R1110 Vomiting, unspecified: Secondary | ICD-10-CM

## 2015-02-09 DIAGNOSIS — K8020 Calculus of gallbladder without cholecystitis without obstruction: Secondary | ICD-10-CM | POA: Diagnosis present

## 2015-02-09 DIAGNOSIS — K529 Noninfective gastroenteritis and colitis, unspecified: Secondary | ICD-10-CM | POA: Diagnosis present

## 2015-02-09 DIAGNOSIS — R778 Other specified abnormalities of plasma proteins: Secondary | ICD-10-CM | POA: Diagnosis present

## 2015-02-09 HISTORY — DX: Headache, unspecified: R51.9

## 2015-02-09 HISTORY — DX: Personal history of other diseases of the digestive system: Z87.19

## 2015-02-09 HISTORY — DX: Cardiac murmur, unspecified: R01.1

## 2015-02-09 HISTORY — DX: Headache: R51

## 2015-02-09 HISTORY — DX: Pneumonia, unspecified organism: J18.9

## 2015-02-09 LAB — URINALYSIS, ROUTINE W REFLEX MICROSCOPIC
Bilirubin Urine: NEGATIVE
Glucose, UA: 500 mg/dL — AB
Ketones, ur: 15 mg/dL — AB
Leukocytes, UA: NEGATIVE
NITRITE: NEGATIVE
Protein, ur: >300 mg/dL — AB
Specific Gravity, Urine: 1.027 (ref 1.005–1.030)
Urobilinogen, UA: 0.2 mg/dL (ref 0.0–1.0)
pH: 7 (ref 5.0–8.0)

## 2015-02-09 LAB — I-STAT TROPONIN, ED: TROPONIN I, POC: 0.19 ng/mL — AB (ref 0.00–0.08)

## 2015-02-09 LAB — PROTIME-INR
INR: 1.09 (ref 0.00–1.49)
Prothrombin Time: 14.3 seconds (ref 11.6–15.2)

## 2015-02-09 LAB — RAPID URINE DRUG SCREEN, HOSP PERFORMED
AMPHETAMINES: NOT DETECTED
Barbiturates: NOT DETECTED
Benzodiazepines: NOT DETECTED
COCAINE: NOT DETECTED
Opiates: NOT DETECTED
Tetrahydrocannabinol: POSITIVE — AB

## 2015-02-09 LAB — MRSA PCR SCREENING: MRSA BY PCR: NEGATIVE

## 2015-02-09 LAB — COMPREHENSIVE METABOLIC PANEL
ALK PHOS: 71 U/L (ref 38–126)
ALT: 24 U/L (ref 14–54)
ANION GAP: 16 — AB (ref 5–15)
AST: 43 U/L — ABNORMAL HIGH (ref 15–41)
Albumin: 4.4 g/dL (ref 3.5–5.0)
BUN: 9 mg/dL (ref 6–20)
CO2: 23 mmol/L (ref 22–32)
Calcium: 10.3 mg/dL (ref 8.9–10.3)
Chloride: 97 mmol/L — ABNORMAL LOW (ref 101–111)
Creatinine, Ser: 1.04 mg/dL — ABNORMAL HIGH (ref 0.44–1.00)
GFR calc Af Amer: >60 mL/min (ref >60)
GFR calc non Af Amer: >60 mL/min (ref >60)
Glucose, Bld: 204 mg/dL — ABNORMAL HIGH (ref 65–99)
Potassium: 3.5 mmol/L (ref 3.5–5.1)
SODIUM: 136 mmol/L (ref 135–145)
Total Bilirubin: 0.9 mg/dL (ref 0.3–1.2)
Total Protein: 10 g/dL — ABNORMAL HIGH (ref 6.5–8.1)

## 2015-02-09 LAB — TROPONIN I: TROPONIN I: 0.59 ng/mL — AB (ref <0.031)

## 2015-02-09 LAB — MAGNESIUM: Magnesium: 1.6 mg/dL — ABNORMAL LOW (ref 1.7–2.4)

## 2015-02-09 LAB — PROCALCITONIN: Procalcitonin: <0.1 ng/mL

## 2015-02-09 LAB — CBC
HCT: 47.8 % — ABNORMAL HIGH (ref 36.0–46.0)
HEMOGLOBIN: 16.6 g/dL — AB (ref 12.0–15.0)
MCH: 27.7 pg (ref 26.0–34.0)
MCHC: 34.7 g/dL (ref 30.0–36.0)
MCV: 79.8 fL (ref 78.0–100.0)
PLATELETS: 437 10*3/uL — AB (ref 150–400)
RBC: 5.99 MIL/uL — ABNORMAL HIGH (ref 3.87–5.11)
RDW: 17.2 % — ABNORMAL HIGH (ref 11.5–15.5)
WBC: 14.9 10*3/uL — AB (ref 4.0–10.5)

## 2015-02-09 LAB — APTT: aPTT: 29 seconds (ref 24–37)

## 2015-02-09 LAB — URINE MICROSCOPIC-ADD ON

## 2015-02-09 LAB — POC OCCULT BLOOD, ED: FECAL OCCULT BLD: NEGATIVE

## 2015-02-09 LAB — PREGNANCY, URINE: Preg Test, Ur: NEGATIVE

## 2015-02-09 LAB — I-STAT BETA HCG BLOOD, ED (MC, WL, AP ONLY): HCG, QUANTITATIVE: 6.5 m[IU]/mL — AB (ref <5)

## 2015-02-09 LAB — BRAIN NATRIURETIC PEPTIDE: B Natriuretic Peptide: 3120.5 pg/mL — ABNORMAL HIGH (ref 0.0–100.0)

## 2015-02-09 LAB — LACTIC ACID, PLASMA
LACTIC ACID, VENOUS: 1.5 mmol/L (ref 0.5–2.0)
LACTIC ACID, VENOUS: 1.4 mmol/L (ref 0.5–2.0)

## 2015-02-09 LAB — I-STAT CG4 LACTIC ACID, ED: Lactic Acid, Venous: 4.3 mmol/L (ref 0.5–2.0)

## 2015-02-09 LAB — LIPASE, BLOOD: LIPASE: 12 U/L — AB (ref 22–51)

## 2015-02-09 MED ORDER — NITROGLYCERIN IN D5W 200-5 MCG/ML-% IV SOLN
0.0000 ug/min | INTRAVENOUS | Status: DC
  Filled 2015-02-09: qty 250

## 2015-02-09 MED ORDER — VANCOMYCIN HCL IN DEXTROSE 1-5 GM/200ML-% IV SOLN: 1000.0000 mg | Freq: Once | INTRAVENOUS | Status: DC

## 2015-02-09 MED ORDER — LABETALOL HCL 5 MG/ML IV SOLN
10.0000 mg | Freq: Once | INTRAVENOUS | Status: AC
  Administered 2015-02-09: 10 mg via INTRAVENOUS
  Filled 2015-02-09: qty 4

## 2015-02-09 MED ORDER — PIPERACILLIN-TAZOBACTAM 3.375 G IVPB
3.3750 g | Freq: Three times a day (TID) | INTRAVENOUS | Status: DC
  Administered 2015-02-09 – 2015-02-14 (×14): 3.375 g via INTRAVENOUS
  Filled 2015-02-09 (×17): qty 50

## 2015-02-09 MED ORDER — HYDRALAZINE HCL 20 MG/ML IJ SOLN
10.0000 mg | Freq: Once | INTRAMUSCULAR | Status: AC
  Administered 2015-02-09: 10 mg via INTRAVENOUS

## 2015-02-09 MED ORDER — SODIUM CHLORIDE 0.9 % IV BOLUS (SEPSIS)
1000.0000 mL | Freq: Once | INTRAVENOUS | Status: AC
  Administered 2015-02-09: 1000 mL via INTRAVENOUS

## 2015-02-09 MED ORDER — POTASSIUM CHLORIDE IN NACL 40-0.9 MEQ/L-% IV SOLN
INTRAVENOUS | Status: DC
  Administered 2015-02-09 – 2015-02-10 (×4): 100 mL/h via INTRAVENOUS
  Filled 2015-02-09 (×9): qty 1000

## 2015-02-09 MED ORDER — VANCOMYCIN HCL IN DEXTROSE 1-5 GM/200ML-% IV SOLN
1000.0000 mg | Freq: Two times a day (BID) | INTRAVENOUS | Status: DC
  Administered 2015-02-10 – 2015-02-11 (×3): 1000 mg via INTRAVENOUS
  Filled 2015-02-09 (×4): qty 200

## 2015-02-09 MED ORDER — PIPERACILLIN-TAZOBACTAM 3.375 G IVPB 30 MIN: 3.3750 g | Freq: Once | INTRAVENOUS | Status: DC

## 2015-02-09 MED ORDER — IOHEXOL 300 MG/ML  SOLN
25.0000 mL | Freq: Once | INTRAMUSCULAR | Status: AC | PRN
  Administered 2015-02-09: 25 mL via ORAL

## 2015-02-09 MED ORDER — ACETAMINOPHEN 325 MG PO TABS
650.0000 mg | ORAL_TABLET | Freq: Four times a day (QID) | ORAL | Status: DC | PRN
  Administered 2015-02-11: 650 mg via ORAL
  Filled 2015-02-09: qty 2

## 2015-02-09 MED ORDER — DIPHENHYDRAMINE HCL 50 MG/ML IJ SOLN: 12.5000 mg | Freq: Four times a day (QID) | INTRAMUSCULAR | Status: DC | PRN

## 2015-02-09 MED ORDER — ACETAMINOPHEN 325 MG PO TABS: 650.0000 mg | ORAL_TABLET | Freq: Once | ORAL | Status: DC

## 2015-02-09 MED ORDER — ONDANSETRON HCL 4 MG PO TABS: 4.0000 mg | ORAL_TABLET | Freq: Four times a day (QID) | ORAL | Status: DC | PRN

## 2015-02-09 MED ORDER — ALUM & MAG HYDROXIDE-SIMETH 200-200-20 MG/5ML PO SUSP: 30.0000 mL | Freq: Four times a day (QID) | ORAL | Status: DC | PRN

## 2015-02-09 MED ORDER — ACETAMINOPHEN 10 MG/ML IV SOLN
1000.0000 mg | Freq: Four times a day (QID) | INTRAVENOUS | Status: DC
  Administered 2015-02-09: 1000 mg via INTRAVENOUS
  Filled 2015-02-09 (×4): qty 100

## 2015-02-09 MED ORDER — MAGNESIUM SULFATE 2 GM/50ML IV SOLN
2.0000 g | Freq: Once | INTRAVENOUS | Status: AC
  Administered 2015-02-09: 2 g via INTRAVENOUS
  Filled 2015-02-09: qty 50

## 2015-02-09 MED ORDER — HYDROMORPHONE HCL 1 MG/ML IJ SOLN
0.5000 mg | INTRAMUSCULAR | Status: DC | PRN
  Administered 2015-02-09 – 2015-02-13 (×13): 0.5 mg via INTRAVENOUS
  Filled 2015-02-09 (×13): qty 1

## 2015-02-09 MED ORDER — SODIUM CHLORIDE 0.9 % IJ SOLN
3.0000 mL | Freq: Two times a day (BID) | INTRAMUSCULAR | Status: DC
  Administered 2015-02-09 – 2015-02-12 (×7): 3 mL via INTRAVENOUS

## 2015-02-09 MED ORDER — VANCOMYCIN HCL IN DEXTROSE 1-5 GM/200ML-% IV SOLN
1000.0000 mg | Freq: Once | INTRAVENOUS | Status: AC
  Administered 2015-02-09: 1000 mg via INTRAVENOUS
  Filled 2015-02-09: qty 200

## 2015-02-09 MED ORDER — HEPARIN SODIUM (PORCINE) 5000 UNIT/ML IJ SOLN: 5000.0000 [IU] | Freq: Three times a day (TID) | INTRAMUSCULAR | Status: DC

## 2015-02-09 MED ORDER — ACETAMINOPHEN 650 MG RE SUPP: 650.0000 mg | Freq: Four times a day (QID) | RECTAL | Status: DC | PRN

## 2015-02-09 MED ORDER — HEPARIN SODIUM (PORCINE) 5000 UNIT/ML IJ SOLN
5000.0000 [IU] | Freq: Three times a day (TID) | INTRAMUSCULAR | Status: DC
  Administered 2015-02-09 – 2015-02-10 (×2): 5000 [IU] via SUBCUTANEOUS
  Filled 2015-02-09 (×2): qty 1

## 2015-02-09 MED ORDER — PIPERACILLIN-TAZOBACTAM 3.375 G IVPB 30 MIN
3.3750 g | Freq: Once | INTRAVENOUS | Status: AC
  Administered 2015-02-09: 3.375 g via INTRAVENOUS
  Filled 2015-02-09: qty 50

## 2015-02-09 MED ORDER — SODIUM CHLORIDE 0.9 % IV BOLUS (SEPSIS)
2000.0000 mL | Freq: Once | INTRAVENOUS | Status: AC
Start: 2015-02-09 — End: 2015-02-09
  Administered 2015-02-09: 2000 mL via INTRAVENOUS

## 2015-02-09 MED ORDER — IOHEXOL 300 MG/ML  SOLN
100.0000 mL | Freq: Once | INTRAMUSCULAR | Status: AC | PRN
  Administered 2015-02-09: 100 mL via INTRAVENOUS

## 2015-02-09 MED ORDER — HYDRALAZINE HCL 20 MG/ML IJ SOLN
10.0000 mg | Freq: Once | INTRAMUSCULAR | Status: DC
  Filled 2015-02-09: qty 1

## 2015-02-09 MED ORDER — DIPHENHYDRAMINE HCL 50 MG/ML IJ SOLN
25.0000 mg | Freq: Once | INTRAMUSCULAR | Status: AC
Start: 2015-02-09 — End: 2015-02-09
  Administered 2015-02-09: 25 mg via INTRAVENOUS
  Filled 2015-02-09: qty 1

## 2015-02-09 MED ORDER — HYDROMORPHONE HCL 1 MG/ML IJ SOLN
0.5000 mg | Freq: Once | INTRAMUSCULAR | Status: AC
  Administered 2015-02-09: 0.5 mg via INTRAVENOUS
  Filled 2015-02-09: qty 1

## 2015-02-09 MED ORDER — HYDRALAZINE HCL 20 MG/ML IJ SOLN
10.0000 mg | Freq: Four times a day (QID) | INTRAMUSCULAR | Status: DC | PRN
  Administered 2015-02-09: 10 mg via INTRAVENOUS
  Administered 2015-02-10: 0.5 mg via INTRAVENOUS
  Administered 2015-02-11 – 2015-02-13 (×2): 10 mg via INTRAVENOUS
  Filled 2015-02-09 (×4): qty 1

## 2015-02-09 MED ORDER — PNEUMOCOCCAL VAC POLYVALENT 25 MCG/0.5ML IJ INJ
0.5000 mL | INJECTION | INTRAMUSCULAR | Status: AC
  Administered 2015-02-10: 0.5 mL via INTRAMUSCULAR
  Filled 2015-02-09: qty 0.5

## 2015-02-09 MED ORDER — ONDANSETRON HCL 4 MG/2ML IJ SOLN
4.0000 mg | Freq: Once | INTRAMUSCULAR | Status: AC
  Administered 2015-02-09: 4 mg via INTRAVENOUS
  Filled 2015-02-09: qty 2

## 2015-02-09 MED ORDER — ONDANSETRON HCL 4 MG/2ML IJ SOLN
4.0000 mg | Freq: Four times a day (QID) | INTRAMUSCULAR | Status: DC | PRN
  Administered 2015-02-09 – 2015-02-12 (×5): 4 mg via INTRAVENOUS
  Filled 2015-02-09 (×5): qty 2

## 2015-02-09 NOTE — ED Notes (Signed)
Pt states she was diagnosed with food poisoning last week and then started having abdominal pain all over with vomiting and diarrhea. Pt is hypertensive 207/126

## 2015-02-09 NOTE — ED Provider Notes (Signed)
CSN: [id number]     Arrival date & time 02/09/15  1144 History   First MD Initiated Contact with Patient 02/09/15 1221     Chief Complaint  Patient presents with  . Abdominal Pain  . Emesis  . Diarrhea     (Consider location/radiation/quality/duration/timing/severity/associated sxs/prior Treatment) HPI   Blood pressure 216/144, pulse 121, temperature 100.3 F (37.9 C), temperature source Rectal, resp. rate 39, last menstrual period 12/31/2014, SpO2 100 %.  Emily Key is a 44 y.o. female with past medical history significant for chronic kidney disease, CAD, hypertension, CHF (diagnosed after remote pregnancy, recho in May 2016 with no abnormalities) complaining of severe epigastric and left upper quadrant pain with associated blood-tinged coffee-ground emesis and darker than normal stool onset last evening. Associated symptoms of retrosternal chest tightness, shortness of breath, dry cough, sweaty chills. Patient was recently seen and admitted for sepsis resumed secondary to enteritis. Patient states she feels the same as when she came into the hospital several weeks ago. She was prescribed Cipro and Flagyl which she has been taking regularly, she did well for several days after discharge. States she drank one beer 2 days ago, vomiting started 24 hours after that. Patient denies excessive alcohol use, NSAID use, history of peptic ulcer or GI bleed.  Patient denies dysuria, hematuria, abnormal vaginal discharge, rash, history of IV drug use, history of recent travel, sick contacts, camping, tick bites, she reports a local headache, 8 out of 10 which she knows that she's had upon waking. + Blurred vision worse when reading.  She reports stiff neck which is largely resolved. She notes a dyspnea on exertion which has been gradually worsening, 3 pillow orthopnea, increasing bilateral lower tremor the peripheral edema which is now resolved.   Past Medical History  Diagnosis Date  . Coronary artery  disease   . Hypertension   . CHF (congestive heart failure)     after last pregnancy  . Nausea, vomiting and diarrhea 01/30/2015   Past Surgical History  Procedure Laterality Date  . Tonsillectomy    . Tubal ligation    . Video assisted thoracoscopy (vats)/decortication Left 11/14/2014    Procedure: LEFT VIDEO ASSISTED THORACOSCOPY (VATS)/DECORTICATION;  Surgeon: Melrose Nakayama, MD;  Location: [facility];  Service: Thoracic;  Laterality: Left;  . Pleural effusion drainage Left 11/14/2014    Procedure: DRAINAGE OF LEFT PLEURAL EFFUSION;  Surgeon: Melrose Nakayama, MD;  Location: [facility] OR;  Service: Thoracic;  Laterality: Left;   Family History  Problem Relation Age of Onset  . Diabetes Mellitus II Mother   . Hypertension Mother   . Lung cancer Father    History  Substance Use Topics  . Smoking status: Former Smoker -- 0.25 packs/day    Types: Cigarettes    Quit date: 11/11/2014  . Smokeless tobacco: Never Used  . Alcohol Use: 0.0 oz/week    0 Standard drinks or equivalent per week     Comment: occassionally   OB History    No data available     Review of Systems  10 systems reviewed and found to be negative, except as noted in the HPI.   Allergies  Review of patient's allergies indicates no known allergies.  Home Medications   Prior to Admission medications   Medication Sig Start Date End Date Taking? Authorizing Provider  amLODipine (NORVASC) 10 MG tablet Take 1 tablet (10 mg total) by mouth daily. 11/22/14  Yes Kelvin Cellar, MD  cloNIDine (CATAPRES) 0.2 MG tablet Take 1  tablet (0.2 mg total) by mouth 3 (three) times daily. 11/22/14  Yes Kelvin Cellar, MD  labetalol (NORMODYNE) 100 MG tablet Take 1 tablet (100 mg total) by mouth 2 (two) times daily. 02/04/15  Yes Maryann Mikhail, DO  metroNIDAZOLE (FLAGYL) 500 MG tablet Take 1 tablet (500 mg total) by mouth every 8 (eight) hours. 02/04/15  Yes Maryann Mikhail, DO   BP 186/109 mmHg  Pulse 120  Temp(Src) 100.3 F  (37.9 C) (Rectal)  Resp 42  SpO2 100%  LMP 12/31/2014 Physical Exam  Constitutional: She is oriented to person, place, and time. She appears well-developed and well-nourished. No distress.  HENT:  Head: Normocephalic and atraumatic.  Mouth/Throat: Oropharynx is clear and moist.  Eyes: Conjunctivae and EOM are normal. Pupils are equal, round, and reactive to light.  No TTP of maxillary or frontal sinuses  No TTP or induration of temporal arteries bilaterally  Neck: Normal range of motion. Neck supple. No JVD present. No tracheal deviation present.  FROM to C-spine. Pt can touch chin to chest without discomfort. No TTP of midline cervical spine.   Cardiovascular: Regular rhythm and intact distal pulses.   Radial pulse equal bilaterally  Pulmonary/Chest: Effort normal and breath sounds normal. No stridor. No respiratory distress. She has no wheezes. She has no rales. She exhibits no tenderness.  Tachypnea, excellent air movement in all fields  Abdominal: Soft. Bowel sounds are normal. She exhibits no distension and no mass. There is tenderness. There is no rebound and no guarding.  +TTP >>LUQ, RUQ, epigastrium   Musculoskeletal: Normal range of motion. She exhibits no edema or tenderness.  No calf asymmetry, superficial collaterals, palpable cords, edema, Homans sign negative bilaterally.    Neurological: She is alert and oriented to person, place, and time. No cranial nerve deficit.  II-Visual fields grossly intact. III/IV/VI-Extraocular movements intact.  Pupils reactive bilaterally. V/VII-Smile symmetric, equal eyebrow raise,  facial sensation intact VIII- Hearing grossly intact IX/X-Normal gag XI-bilateral shoulder shrug XII-midline tongue extension Motor: 5/5 bilaterally with normal tone and bulk Cerebellar: Normal finger-to-nose  and normal heel-to-shin test.     Skin: Skin is warm. She is not diaphoretic.  Psychiatric:  Intermittently tearful: States she doesn't know what's  wrong with her, states all this started happening in the last several months and she is worried. She denies SI, HI, auditory or visual hallucinations.  Nursing note and vitals reviewed.   ED Course  Procedures (including critical care time) Labs Review Labs Reviewed  LIPASE, BLOOD - Abnormal; Notable for the following:    Lipase 12 (*)    All other components within normal limits  COMPREHENSIVE METABOLIC PANEL - Abnormal; Notable for the following:    Chloride 97 (*)    Glucose, Bld 204 (*)    Creatinine, Ser 1.04 (*)    Total Protein 10.0 (*)    AST 43 (*)    Anion gap 16 (*)    All other components within normal limits  CBC - Abnormal; Notable for the following:    WBC 14.9 (*)    RBC 5.99 (*)    Hemoglobin 16.6 (*)    HCT 47.8 (*)    RDW 17.2 (*)    Platelets 437 (*)    All other components within normal limits  URINALYSIS, ROUTINE W REFLEX MICROSCOPIC (NOT AT [facility]) - Abnormal; Notable for the following:    Glucose, UA 500 (*)    Hgb urine dipstick MODERATE (*)    Ketones, ur 15 (*)  Protein, ur >300 (*)    All other components within normal limits  URINE MICROSCOPIC-ADD ON - Abnormal; Notable for the following:    Squamous Epithelial / LPF FEW (*)    Bacteria, UA FEW (*)    Casts GRANULAR CAST (*)    All other components within normal limits  URINE RAPID DRUG SCREEN, HOSP PERFORMED - Abnormal; Notable for the following:    Tetrahydrocannabinol POSITIVE (*)    All other components within normal limits  I-STAT BETA HCG BLOOD, ED ([facility], [facility], [facility] ONLY) - Abnormal; Notable for the following:    I-stat hCG, quantitative 6.5 (*)    All other components within normal limits  I-STAT CG4 LACTIC ACID, ED - Abnormal; Notable for the following:    Lactic Acid, Venous 4.30 (*)    All other components within normal limits  CULTURE, BLOOD (ROUTINE X 2)  CLOSTRIDIUM DIFFICILE BY PCR (NOT AT [facility])  CULTURE, BLOOD (ROUTINE X 2)  URINE CULTURE  CLOSTRIDIUM DIFFICILE BY PCR (NOT AT  [facility])  PREGNANCY, URINE  OCCULT BLOOD X 1 CARD TO LAB, STOOL  GI PATHOGEN PANEL BY PCR, STOOL  BRAIN NATRIURETIC PEPTIDE  LACTIC ACID, PLASMA  LACTIC ACID, PLASMA  PROCALCITONIN  PROTIME-INR  APTT  POC OCCULT BLOOD, ED  I-STAT TROPOININ, ED    Imaging Review Dg Chest Portable 1 View  02/09/2015   CLINICAL DATA:  Kidney a weakness shortness of breath  EXAM: PORTABLE CHEST - 1 VIEW  COMPARISON:  01/30/2015  FINDINGS: Heart size within normal limits allowing for rotation and lordotic positioning. Vascular pattern normal. Lungs clear. Persistent minimal left costophrenic angle blunting.  IMPRESSION: Stable mild left blunting of the costophrenic angle suggesting pleural thickening or mild effusion.   Electronically Signed   By: Skipper Cliche M.D.   On: 02/09/2015 13:52     EKG Interpretation   Date/Time:  Monday February 09 2015 12:47:04 EDT Ventricular Rate:  134 PR Interval:  183 QRS Duration: 89 QT Interval:  351 QTC Calculation: 524 R Axis:   -94 Text Interpretation:  Sinus tachycardia Ventricular bigeminy LAE, consider  biatrial enlargement Left anterior fascicular block Anterolateral infarct,  age indeterminate Bigeminy, new Confirmed by Mingo Amber  MD, BLAIR [phone]) on  02/09/2015 12:51:56 PM      MDM   Final diagnoses:  Sepsis, due to unspecified organism  Intractable vomiting with nausea, vomiting of unspecified type  Essential hypertension  Chronic kidney disease, unspecified stage  Chest pain, unspecified chest pain type  DOE (dyspnea on exertion)    Filed Vitals:   02/09/15 1400 02/09/15 1415 02/09/15 1430 02/09/15 1500  BP: 184/107 192/118 181/94 186/109  Pulse: 99 51 106 120  Temp:      TempSrc:      Resp: 26 16 18  42  SpO2: 100% 100% 100% 100%    Medications  acetaminophen (OFIRMEV) IV 1,000 mg (0 mg Intravenous Stopped 02/09/15 1416)  acetaminophen (TYLENOL) tablet 650 mg (0 mg Oral Hold 02/09/15 1441)  sodium chloride 0.9 % bolus 1,000 mL (1,000 mLs  Intravenous New Bag/Given 02/09/15 1440)  HYDROmorphone (DILAUDID) injection 0.5 mg (0.5 mg Intravenous Given 02/09/15 1457)  nitroGLYCERIN 50 mg in dextrose 5 % 250 mL (0.2 mg/mL) infusion (0 mcg/min Intravenous Hold 02/09/15 1523)  piperacillin-tazobactam (ZOSYN) IVPB 3.375 g (not administered)  vancomycin (VANCOCIN) IVPB 1000 mg/200 mL premix (not administered)  sodium chloride 0.9 % bolus 2,000 mL (not administered)  labetalol (NORMODYNE,TRANDATE) injection 10 mg (not administered)  ondansetron (ZOFRAN) injection 4  mg (4 mg Intravenous Given 02/09/15 1253)  sodium chloride 0.9 % bolus 1,000 mL (0 mLs Intravenous Stopped 02/09/15 1506)  HYDROmorphone (DILAUDID) injection 0.5 mg (0.5 mg Intravenous Given 02/09/15 1301)  vancomycin (VANCOCIN) IVPB 1000 mg/200 mL premix (1,000 mg Intravenous New Bag/Given 02/09/15 1400)  piperacillin-tazobactam (ZOSYN) IVPB 3.375 g (0 g Intravenous Stopped 02/09/15 1441)  hydrALAZINE (APRESOLINE) injection 10 mg (10 mg Intravenous Given 02/09/15 1423)  diphenhydrAMINE (BENADRYL) injection 25 mg (25 mg Intravenous Given 02/09/15 1456)    Emily Key is a pleasant 44 y.o. female presenting with multiple episodes of what she describes as darker than normal diarrhea, and emesis onset yesterday evening. Patient was recently seen and admitted for sepsis presumed secondary to enteritis. Patient was [place] home with Cipro and Flagyl, she had been doing well up until yesterday evening. Patient is found to be borderline febrile with a rectal temperature of 100.3, heart rate is tachycardic at 120 with bigeminy, her blood pressure is significantly elevated, she has not been able to keep down any of her blood pressure medications, she is difficult to control hypertension takes for hypertension medications. Chart review shows a renal artery ultrasound with no stenosis. Patient is tachypnea with clear lung sounds, saturating well on room air, she is reporting cough however her x-rays without  infiltrate. Lactic acid is elevated at 4.3. She has a leukocytosis of 14 however it appears hemoconcentrated with elevation and all blood lines. Patient has history of CHF, will go slow on fluids. Start with 500 mL bolus.  Abdominal exam is nonsurgical with tenderness palpation in the bilateral upper quadrants, left greater than right. Patient will be started on a broad-spectrum antibiotics, vancomycin Zosyn initiated and she is pancultured. Her chronic renal disease does not appear to have a acute exacerbation with a creatinine of 1.04. Patient was recently CT with the last 2 weeks, she states that her pain is similar to the last time she was Scanned, considering her chronic renal insufficiency we will hold off on obtaining CT scan at this time.  Lung sounds remained clear to auscultation after initial bolus, second bolus ordered.  Repeat abdominal exam remains nonsurgical. Patient reports improvement of pain from 10 out of 10 to 5 out of 10 with half milligram of Dilaudid.  This is a shared visit with the attending physician who personally evaluated the patient and agrees with the care plan.   Case discussed with [facility] except admission to a step down bed.    Monico Blitz, PA-C 02/09/15 1531  Evelina Bucy, MD 02/09/15 [phone]

## 2015-02-09 NOTE — Progress Notes (Signed)
ANTIBIOTIC CONSULT NOTE - INITIAL  Pharmacy Consult for Vancomycin and Zosyn Indication: rule out sepsis  No Known Allergies  Patient Measurements:   Adjusted Body Weight:   Vital Signs: Temp: 100.3 F (37.9 C) (08/01 1242) Temp Source: Rectal (08/01 1242) BP: 186/109 mmHg (08/01 1500) Pulse Rate: 120 (08/01 1500) Intake/Output from previous day:   Intake/Output from this shift:    Labs:  Recent Labs  02/09/15 1241  WBC 14.9*  HGB 16.6*  PLT 437*  CREATININE 1.04*   Estimated Creatinine Clearance: 79.9 mL/min (by C-G formula based on Cr of 1.04). No results for input(s): VANCOTROUGH, VANCOPEAK, VANCORANDOM, GENTTROUGH, GENTPEAK, GENTRANDOM, TOBRATROUGH, TOBRAPEAK, TOBRARND, AMIKACINPEAK, AMIKACINTROU, AMIKACIN in the last 72 hours.   Microbiology: Recent Results (from the past 720 hour(s))  Urine culture     Status: None   Collection Time: 01/31/15  9:15 AM  Result Value Ref Range Status   Specimen Description URINE, RANDOM  Final   Special Requests NONE  Final   Culture 3,000 COLONIES/mL INSIGNIFICANT GROWTH  Final   Report Status 02/01/2015 FINAL  Final  Ova and parasite examination     Status: None   Collection Time: 02/03/15 10:03 PM  Result Value Ref Range Status   Specimen Description STOOL  Final   Special Requests NONE  Final   Ova and parasites   Final    NO OVA OR PARASITES SEEN Performed at [facility]    Report Status 02/04/2015 FINAL  Final  Stool culture     Status: None   Collection Time: 02/03/15 10:03 PM  Result Value Ref Range Status   Specimen Description STOOL  Final   Special Requests NONE  Final   Culture   Final    NO SALMONELLA, SHIGELLA, CAMPYLOBACTER, YERSINIA, OR E.COLI 0157:[id number] ISOLATED Performed at [facility]    Report Status 02/07/2015 FINAL  Final  Blood culture (routine x 2)     Status: None (Preliminary result)   Collection Time: 02/09/15  1:22 PM  Result Value Ref Range Status   Specimen Description  BLOOD LEFT ANTECUBITAL  Final   Special Requests BOTTLES DRAWN AEROBIC AND ANAEROBIC 5CC  Final   Culture PENDING  Incomplete   Report Status PENDING  Incomplete    Medical History: Past Medical History  Diagnosis Date  . Coronary artery disease   . Hypertension   . CHF (congestive heart failure)     after last pregnancy  . Nausea, vomiting and diarrhea 01/30/2015    Medications:   (Not in a hospital admission) Scheduled:  . acetaminophen  650 mg Oral Once   Infusions:  . acetaminophen Stopped (02/09/15 1416)  . nitroGLYCERIN    . piperacillin-tazobactam Stopped (02/09/15 1512)  . sodium chloride 1,000 mL (02/09/15 1440)  . vancomycin     Assessment: 44yo female with history of CAD, HTN and CHF presents with abdominal pain, emesis and diarrhea. Pharmacy is consulted to dose vancomycin and zosyn for suspected sepsis. Pt is febrile to 100.3, WBC 14.9, sCr 1.04.  Goal of Therapy:  Vancomycin trough level 15-20 mcg/ml  Plan:  Vancomycin 1g IV q12h Zosyn 3.375g IV q8h Measure antibiotic drug levels at steady state Follow up culture results, renal function and clinical course  Andrey Cota. Diona Foley, PharmD Clinical Pharmacist Pager [phone] 02/09/2015,3:12 PM

## 2015-02-09 NOTE — H&P (Signed)
[facility] History and Physical                                                                                    Emily Key, is a 44 y.o. female  MRN: [id number]   DOB - [birth year]  Admit Date - 02/09/2015  Outpatient Primary MD for the patient is [name], MD  Referring Physician:  Monico Blitz, ER-PA  Chief Complaint:   Chief Complaint  Patient presents with  . Abdominal Pain  . Emesis  . Diarrhea     HPI  Emily Key  is a 44 y.o. female, with hypertension and a history of MI. She was admitted from 7/22-7/27 with presumed enteritis. Her GI pathogen panel during that admission was completely negative. She was discharged on 7 days of Cipro/Flagyl. She went home and was doing well. She was almost finished with her antibiotic therapy and eating solid food. She states her bowel movements had not completely normalized but were better. Yesterday at dinner she ate fried chicken and green beans. Suddenly on 7/31 close to midnight her abdominal pain returned and she began to vomit and had dark diarrhea. She reports having a minimum of 5 diarrhea/vomiting episodes overnight. Her abdominal pain both upper quadrants  is severe. Nothing seemed to relieve it at home. It has been relieved by hydromorphone in the ER. Her blood pressure the time of admission was 216/144, temperature 100.3, pulse rate 139, respirations 42, her white count is 14.9 hemoglobin 16.6, platelets 437. Fortunately her creatinine is 1.04.  She is being admitted to the stepdown unit for severe sepsis.  Review of Systems   In addition to the HPI above,  No Fever-chills, No Headache, No changes with Vision or hearing, No problems swallowing food or Liquids, No Chest pain, Cough or Shortness of Breath, No Blood in stool or Urine, No dysuria, No new skin rashes or bruises, No new joints pains-aches,  No new weakness, tingling, numbness in any extremity, No recent weight gain or loss, A full 10 point Review  of Systems was done, except as stated above, all other Review of Systems were negative.  Past Medical History  Past Medical History  Diagnosis Date  . Coronary artery disease   . Hypertension   . CHF (congestive heart failure)     after last pregnancy  . Nausea, vomiting and diarrhea 01/30/2015    Past Surgical History  Procedure Laterality Date  . Tonsillectomy    . Tubal ligation    . Video assisted thoracoscopy (vats)/decortication Left 11/14/2014    Procedure: LEFT VIDEO ASSISTED THORACOSCOPY (VATS)/DECORTICATION;  Surgeon: Melrose Nakayama, MD;  Location: [facility];  Service: Thoracic;  Laterality: Left;  . Pleural effusion drainage Left 11/14/2014    Procedure: DRAINAGE OF LEFT PLEURAL EFFUSION;  Surgeon: Melrose Nakayama, MD;  Location: [facility];  Service: Thoracic;  Laterality: Left;      Social History History  Substance Use Topics  . Smoking status: Former Smoker -- 0.25 packs/day    Types: Cigarettes    Quit date: 11/11/2014  . Smokeless tobacco: Never Used  . Alcohol Use: 0.0 oz/week    0 Standard drinks or  equivalent per week     Comment: occassionally   she lives at home. Independent with ADLs. She is a Financial controller.  Family History Family History  Problem Relation Age of Onset  . Diabetes Mellitus II Mother   . Hypertension Mother   . Lung cancer Father     Prior to Admission medications   Medication Sig Start Date End Date Taking? Authorizing Provider  amLODipine (NORVASC) 10 MG tablet Take 1 tablet (10 mg total) by mouth daily. 11/22/14  Yes Kelvin Cellar, MD  cloNIDine (CATAPRES) 0.2 MG tablet Take 1 tablet (0.2 mg total) by mouth 3 (three) times daily. 11/22/14  Yes Kelvin Cellar, MD  labetalol (NORMODYNE) 100 MG tablet Take 1 tablet (100 mg total) by mouth 2 (two) times daily. 02/04/15  Yes Maryann Mikhail, DO  metroNIDAZOLE (FLAGYL) 500 MG tablet Take 1 tablet (500 mg total) by mouth every 8 (eight) hours. 02/04/15  Yes Maryann  Mikhail, DO    No Known Allergies  Physical Exam  Vitals  Blood pressure 179/99, pulse 108, temperature 100.3 F (37.9 C), temperature source Rectal, resp. rate 15, last menstrual period 12/31/2014, SpO2 100 %.   General: 44 year old female, lying in bed in moderate distress due to pain, apologizing for being so much trouble  Psych:  Normal affect and insight, Not Suicidal or Homicidal, Awake Alert, Oriented X 3.  Neuro:   No F.N deficits, ALL C.Nerves Intact, Strength 5/5 all 4 extremities, Sensation intact all 4 extremities.  ENT:  Ears and Eyes appear Normal, Conjunctivae clear, PER. Moist oral mucosa without erythema or exudates.  Neck:  Supple, No lymphadenopathy appreciated  Respiratory:  Symmetrical chest wall movement, Good air movement bilaterally, CTAB.  Cardiac:  RRR, No Murmurs, no LE edema noted, no JVD.    Abdomen:  Bilateral tenderness to palpation in the left and right upper abdominal quadrants, soft, nondistended, positive bowel sounds  Skin:  No Cyanosis, Normal Skin Turgor, No Skin Rash or Bruise.  Extremities:  Able to move all 4. 5/5 strength in each,  no effusions.  Data Review  CBC  Recent Labs Lab 02/03/15 0511 02/04/15 0522 02/09/15 1241  WBC 5.8 6.1 14.9*  HGB 11.0* 11.1* 16.6*  HCT 33.6* 33.3* 47.8*  PLT 262 237 437*  MCV 81.0 79.9 79.8  MCH 26.5 26.6 27.7  MCHC 32.7 33.3 34.7  RDW 16.7* 16.4* 17.2*    Chemistries   Recent Labs Lab 02/03/15 0511 02/04/15 0522 02/09/15 1241  NA 137 137 136  K 3.1* 3.2* 3.5  CL 103 106 97*  CO2 26 27 23   GLUCOSE 77 104* 204*  BUN 11 5* 9  CREATININE 0.87 0.91 1.04*  CALCIUM 8.6* 8.9 10.3  AST  --   --  43*  ALT  --   --  24  ALKPHOS  --   --  71  BILITOT  --   --  0.9    Urinalysis    Component Value Date/Time   COLORURINE YELLOW 02/09/2015 [place] 02/09/2015 1242   LABSPEC 1.027 02/09/2015 1242   PHURINE 7.0 02/09/2015 1242   GLUCOSEU 500* 02/09/2015 1242    HGBUR MODERATE* 02/09/2015 1242   BILIRUBINUR NEGATIVE 02/09/2015 1242   KETONESUR 15* 02/09/2015 1242   PROTEINUR >300* 02/09/2015 1242   UROBILINOGEN 0.2 02/09/2015 1242   NITRITE NEGATIVE 02/09/2015 1242   LEUKOCYTESUR NEGATIVE 02/09/2015 1242    Imaging results:   Ct Abdomen Pelvis W Contrast  01/30/2015  CLINICAL DATA:  Abdominal pain with nausea and vomiting beginning at 1:00 a.m. 01/30/2015.  EXAM: CT ABDOMEN AND PELVIS WITH CONTRAST  TECHNIQUE: Multidetector CT imaging of the abdomen and pelvis was performed using the standard protocol following bolus administration of intravenous contrast.  CONTRAST:  100 mL OMNIPAQUE IOHEXOL 300 MG/ML  SOLN  COMPARISON:  CT abdomen and pelvis 10/14/2013.  FINDINGS: Lung bases are clear. No pleural or pericardial effusion. Cardiomegaly noted.  Multiple small nonobstructing renal stones are seen bilaterally. There is no hydronephrosis on the right or left and no ureteral stones identified. The liver is low attenuating consistent with fatty infiltration. No focal liver lesion is identified. The gallbladder, adrenal glands, spleen, pancreas and biliary tree all appear normal.  There is a small volume of free pelvic fluid which is somewhat greater than typically seen in physiologic change. No focal fluid collection is identified. There is no lymphadenopathy. The patient is status post tubal ligation. The stomach, small and large bowel and appendix all appear normal. No lytic or sclerotic bony lesion is identified.  IMPRESSION: Small volume of free pelvic fluid is slightly greater than typically seen in physiologic change. Although the bowel appears normal this finding could be due to enteritis.  Fatty infiltration of the liver.  Multiple bilateral nonobstructing renal stones.  Mild cardiomegaly.   Electronically Signed   By: Inge Rise M.D.   On: 01/30/2015 13:44   Dg Chest Portable 1 View  02/09/2015   CLINICAL DATA:  Kidney a weakness shortness of breath   EXAM: PORTABLE CHEST - 1 VIEW  COMPARISON:  01/30/2015  FINDINGS: Heart size within normal limits allowing for rotation and lordotic positioning. Vascular pattern normal. Lungs clear. Persistent minimal left costophrenic angle blunting.  IMPRESSION: Stable mild left blunting of the costophrenic angle suggesting pleural thickening or mild effusion.   Electronically Signed   By: Skipper Cliche M.D.   On: 02/09/2015 13:52   Dg Abd Acute W/chest  01/30/2015   CLINICAL DATA:  Acute generalized abdominal pain, vomiting.  EXAM: DG ABDOMEN ACUTE W/ 1V CHEST  COMPARISON:  Dec 09, 2014.  FINDINGS: There is no evidence of dilated bowel loops or free intraperitoneal air. Stable bilateral nephrolithiasis. Heart size and mediastinal contours are within normal limits. Stable left basilar scarring is noted. No acute pulmonary disease is noted.  IMPRESSION: No evidence of bowel obstruction or ileus. Stable bilateral nephrolithiasis. No acute cardiopulmonary disease.   Electronically Signed   By: Marijo Conception, M.D.   On: 01/30/2015 11:34    My personal review of EKG: Sinus tach with PVCs.   Assessment & Plan  Principal Problem:   Severe sepsis Active Problems:   Abdominal pain, vomiting, and diarrhea   Hypertensive urgency   Lactic acidosis  Severe sepsis with lactic acidosis. Appears to be secondary to intraabdominal source. Admitted under sepsis protocol to a step down bed.  Have asked E-link to monitor. Blood and urine cultures are pending.  Vanc/Zosyn started.  Will receive 4L of IVF. Lactic acid 4.3, procalcitonin is pending.  Abdominal pain, vomiting, diarrhea. Supportive care.  Stat CT Abd/Pelvis pending. GI pathogen panel, C-diff pcr.  Check acute hepatitis panel,  Enteric precautions. Zofran, Dilaudid PRN. Am concerned for ischemic bowel / possible perf.  Will leave NPO except ice chips.  Hypertensive urgency This may be rebound hypertension from clonidine as she has been unable to take her  medications due to vomiting. Will receive hydralazine and labetalol. Continue when necessary hydralazine to control  blood pressure.   Consultants Called:  Discussed with critical care. Requested that they monitor her on e link.  Family Communication:   None. Patient is alert oriented and understands her plan of care.  Code Status:  Full  Condition:  Guarded. Patient has the potential to decompensate quickly  Potential Disposition: To home when improved. Expect 3-4 days  Time spent in minutes : [address],  PA-C on 02/09/2015 at 3:56 PM Between 7am to 7pm - Pager - [phone] After 7pm go to [email or web address] - password [email or web address] And look for the night coverage person covering me after hours  [facility]

## 2015-02-09 NOTE — ED Notes (Signed)
Notified MD of critical Lactic Acid

## 2015-02-09 NOTE — Progress Notes (Signed)
02/09/2015 patient transfer from the Emergency room to 2central. She is alert, oriented and ambulatory. Patient have bruises on abdomen, heels dry, sacrum is fine and tattoo in the right arm. She received CHG bath and place on contact to R/O c-diff. [facility] RN.

## 2015-02-09 NOTE — Progress Notes (Signed)
Patient having bigeminy on the monitor with PVCs. Ordered mg. Also noted for eleveted poc troponin with elevated BNP. clinically euvolemic. Possibly demand ischemia . Will cycle troponins.

## 2015-02-10 ENCOUNTER — Inpatient Hospital Stay (HOSPITAL_COMMUNITY): Payer: Medicaid Other

## 2015-02-10 ENCOUNTER — Encounter (HOSPITAL_COMMUNITY): Payer: Self-pay | Admitting: Cardiovascular Disease

## 2015-02-10 DIAGNOSIS — L0291 Cutaneous abscess, unspecified: Secondary | ICD-10-CM | POA: Diagnosis present

## 2015-02-10 DIAGNOSIS — A419 Sepsis, unspecified organism: Principal | ICD-10-CM

## 2015-02-10 DIAGNOSIS — R6520 Severe sepsis without septic shock: Secondary | ICD-10-CM

## 2015-02-10 DIAGNOSIS — I252 Old myocardial infarction: Secondary | ICD-10-CM | POA: Insufficient documentation

## 2015-02-10 DIAGNOSIS — R079 Chest pain, unspecified: Secondary | ICD-10-CM

## 2015-02-10 DIAGNOSIS — I214 Non-ST elevation (NSTEMI) myocardial infarction: Secondary | ICD-10-CM

## 2015-02-10 DIAGNOSIS — F1910 Other psychoactive substance abuse, uncomplicated: Secondary | ICD-10-CM

## 2015-02-10 LAB — COMPREHENSIVE METABOLIC PANEL
ALT: 15 U/L (ref 14–54)
AST: 34 U/L (ref 15–41)
Albumin: 3.2 g/dL — ABNORMAL LOW (ref 3.5–5.0)
Alkaline Phosphatase: 46 U/L (ref 38–126)
Anion gap: 10 (ref 5–15)
BILIRUBIN TOTAL: 0.7 mg/dL (ref 0.3–1.2)
BUN: 7 mg/dL (ref 6–20)
CHLORIDE: 104 mmol/L (ref 101–111)
CO2: 22 mmol/L (ref 22–32)
Calcium: 8.4 mg/dL — ABNORMAL LOW (ref 8.9–10.3)
Creatinine, Ser: 0.98 mg/dL (ref 0.44–1.00)
GFR calc Af Amer: >60 mL/min (ref >60)
GLUCOSE: 101 mg/dL — AB (ref 65–99)
POTASSIUM: 3.8 mmol/L (ref 3.5–5.1)
Sodium: 136 mmol/L (ref 135–145)
Total Protein: 6.7 g/dL (ref 6.5–8.1)

## 2015-02-10 LAB — CBC
HCT: 38.8 % (ref 36.0–46.0)
HEMOGLOBIN: 12.7 g/dL (ref 12.0–15.0)
MCH: 26.5 pg (ref 26.0–34.0)
MCHC: 32.7 g/dL (ref 30.0–36.0)
MCV: 81 fL (ref 78.0–100.0)
Platelets: 366 10*3/uL (ref 150–400)
RBC: 4.79 MIL/uL (ref 3.87–5.11)
RDW: 17.7 % — AB (ref 11.5–15.5)
WBC: 12.8 10*3/uL — AB (ref 4.0–10.5)

## 2015-02-10 LAB — HEPATITIS PANEL, ACUTE
HCV AB: 0.2 {s_co_ratio} (ref 0.0–0.9)
HEP B S AG: NEGATIVE
Hep A IgM: NEGATIVE
Hep B C IgM: NEGATIVE

## 2015-02-10 LAB — URINE CULTURE

## 2015-02-10 LAB — TROPONIN I
TROPONIN I: 0.77 ng/mL — AB (ref <0.031)
TROPONIN I: 1.53 ng/mL — AB (ref <0.031)
Troponin I: 0.62 ng/mL (ref <0.031)

## 2015-02-10 LAB — MAGNESIUM: MAGNESIUM: 2.3 mg/dL (ref 1.7–2.4)

## 2015-02-10 MED ORDER — NITROGLYCERIN IN D5W 200-5 MCG/ML-% IV SOLN
0.0000 ug/min | INTRAVENOUS | Status: DC
  Administered 2015-02-10: 5 ug/min via INTRAVENOUS
  Filled 2015-02-10: qty 250

## 2015-02-10 MED ORDER — HEPARIN SODIUM (PORCINE) 5000 UNIT/ML IJ SOLN
5000.0000 [IU] | Freq: Three times a day (TID) | INTRAMUSCULAR | Status: DC
  Administered 2015-02-10 – 2015-02-15 (×14): 5000 [IU] via SUBCUTANEOUS
  Filled 2015-02-10 (×18): qty 1

## 2015-02-10 MED ORDER — METOPROLOL TARTRATE 1 MG/ML IV SOLN
5.0000 mg | Freq: Four times a day (QID) | INTRAVENOUS | Status: DC
  Administered 2015-02-10 – 2015-02-11 (×3): 5 mg via INTRAVENOUS
  Filled 2015-02-10 (×7): qty 5

## 2015-02-10 MED ORDER — HEPARIN (PORCINE) IN NACL 100-0.45 UNIT/ML-% IJ SOLN
1000.0000 [IU]/h | INTRAMUSCULAR | Status: DC
  Filled 2015-02-10: qty 250

## 2015-02-10 MED ORDER — NITROGLYCERIN 2 % TD OINT
1.0000 [in_us] | TOPICAL_OINTMENT | Freq: Four times a day (QID) | TRANSDERMAL | Status: DC
  Administered 2015-02-10: 1 [in_us] via TOPICAL
  Filled 2015-02-10: qty 30

## 2015-02-10 MED ORDER — ASPIRIN 81 MG PO CHEW
81.0000 mg | CHEWABLE_TABLET | Freq: Once | ORAL | Status: AC
  Administered 2015-02-10: 81 mg via ORAL
  Filled 2015-02-10: qty 1

## 2015-02-10 MED ORDER — HEPARIN BOLUS VIA INFUSION
4000.0000 [IU] | Freq: Once | INTRAVENOUS | Status: DC
  Filled 2015-02-10: qty 4000

## 2015-02-10 MED ORDER — CLONIDINE HCL 0.2 MG/24HR TD PTWK
0.2000 mg | MEDICATED_PATCH | TRANSDERMAL | Status: DC
  Administered 2015-02-10: 0.2 mg via TRANSDERMAL
  Filled 2015-02-10: qty 1

## 2015-02-10 NOTE — Progress Notes (Signed)
CRITICAL VALUE ALERT  Critical value received:  Troponin 0.59  Date of notification:  02/09/15  Time of notification:  2046  Critical value read back: yes  Nurse who received alert:  Melody Comas, RN  MD notified (1st page):  Tylene Fantasia, NP  Time of first page:  2100  MD notified (2nd page):   Time of second page:  Responding MD:  Tylene Fantasia, NP  Time MD responded: 2105  No orders received. EKG performed. M.Forest Gleason, RN

## 2015-02-10 NOTE — Consult Note (Addendum)
CONSULT NOTE  Date: 02/10/2015               Patient Name:  Emily Key MRN: [id number]  DOB: 07-08-1971 Age / Sex: 44 y.o., female        PCP: [name] Primary Cardiologist: New / Resha Filippone            Referring Physician: Dia Crawford              Reason for Consult: + Troponin in the setting of sepsis syndrome           History of Present Illness: Patient is a 44 y.o. female with a PMHx of hypertension, empyema-status post decortication who was admitted to [facility] on 02/09/2015 for evaluation of gastroenteritis and sepsis syndrome. 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 She's never had any episodes of angina. She has had some chest pain which was due to her pneumonia and empyema several months ago. She ultimately had a VATS  procedure and decortication by Dr. Merilynn Finland.  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 Her left trigger systolic function has now normalized. Her echocardiogram last week revealed normal left trigger systolic function with an EF of 50-55%. She does have fairly significant hypertension.   She denies ever having any episodes of chest discomfort. She denies having a heart attack. She's never had a stress test. Coronary artery disease was in her past medical history but I have removed this diagnosis given the lack of evidence for CAD.  Medications: Outpatient medications: Prescriptions prior to admission  Medication Sig Dispense Refill Last Dose  . amLODipine (NORVASC) 10 MG tablet Take 1 tablet (10 mg total) by mouth daily. 30 tablet 3 02/08/2015 at Unknown time  . cloNIDine (CATAPRES) 0.2 MG tablet Take 1 tablet (0.2 mg total) by mouth 3 (three) times daily. 90 tablet 3 02/08/2015 at Unknown time  . labetalol (NORMODYNE) 100 MG tablet Take 1 tablet (100 mg total) by mouth 2 (two)  times daily. 60 tablet 0 02/08/2015 at 2300  . metroNIDAZOLE (FLAGYL) 500 MG tablet Take 1 tablet (500 mg total) by mouth every 8 (eight) hours. 6 tablet 0 02/08/2015 at Unknown time    Current medications: Current Facility-Administered Medications  Medication Dose Route Frequency Provider Last Rate Last Dose  . 0.9 % NaCl with KCl 40 mEq / L  infusion   Intravenous Continuous Melton Alar, PA-C 100 mL/hr at 02/10/15 0325 100 mL/hr at 02/10/15 0325  . acetaminophen (TYLENOL) tablet 650 mg  650 mg Oral Q6H PRN Melton Alar, PA-C       Or  . acetaminophen (TYLENOL) suppository 650 mg  650 mg Rectal Q6H PRN Melton Alar, PA-C      . acetaminophen (TYLENOL) tablet 650 mg  650 mg Oral Once [employer], PA-C   Stopped at 02/09/15 1441  . alum & mag hydroxide-simeth (MAALOX/MYLANTA) 200-200-20 MG/5ML suspension 30 mL  30 mL Oral Q6H PRN Melton Alar, PA-C      . diphenhydrAMINE (BENADRYL) injection 12.5 mg  12.5 mg Intravenous Q6H PRN Melton Alar, PA-C      . heparin injection 5,000 Units  5,000 Units Subcutaneous 3 times per day Gardiner Barefoot, NP   5,000 Units at 02/10/15 0511  . hydrALAZINE (APRESOLINE) injection 10 mg  10  mg Intravenous Q6H PRN Melton Alar, PA-C   10 mg at 02/09/15 2121  . HYDROmorphone (DILAUDID) injection 0.5 mg  0.5 mg Intravenous Q4H PRN Melton Alar, PA-C   0.5 mg at 02/10/15 0710  . ondansetron (ZOFRAN) tablet 4 mg  4 mg Oral Q6H PRN Melton Alar, PA-C       Or  . ondansetron [facility]) injection 4 mg  4 mg Intravenous Q6H PRN Melton Alar, PA-C   4 mg at 02/10/15 8119  . piperacillin-tazobactam (ZOSYN) IVPB 3.375 g  3.375 g Intravenous Q8H Rebecka Apley, RPH   3.375 g at 02/10/15 0326  . pneumococcal 23 valent vaccine (PNU-IMMUNE) injection 0.5 mL  0.5 mL Intramuscular Tomorrow-1000 Nishant Dhungel, MD      . sodium chloride 0.9 % injection 3 mL  3 mL Intravenous Q12H Melton Alar, PA-C   3 mL at 02/09/15 2042  . vancomycin (VANCOCIN)  IVPB 1000 mg/200 mL premix  1,000 mg Intravenous Q12H Rebecka Apley, RPH   1,000 mg at 02/10/15 0118     No Known Allergies   Past Medical History  Diagnosis Date  . Hypertension   . CHF (congestive heart failure)     after last pregnancy  . Nausea, vomiting and diarrhea 01/30/2015  . Heart murmur     "born w/one":  Marland Kitchen Pneumonia 11/2014  . History of hiatal hernia   . Daily headache     Past Surgical History  Procedure Laterality Date  . Video assisted thoracoscopy (vats)/decortication Left 11/14/2014    Procedure: LEFT VIDEO ASSISTED THORACOSCOPY (VATS)/DECORTICATION;  Surgeon: Melrose Nakayama, MD;  Location: [place];  Service: Thoracic;  Laterality: Left;  . Pleural effusion drainage Left 11/14/2014    Procedure: DRAINAGE OF LEFT PLEURAL EFFUSION;  Surgeon: Melrose Nakayama, MD;  Location: [place];  Service: Thoracic;  Laterality: Left;  . Tubal ligation  2000  . Tonsillectomy and adenoidectomy  ~ 1986    Family History  Problem Relation Age of Onset  . Diabetes Mellitus II Mother   . Hypertension Mother   . Lung cancer Father     Social History:  reports that she quit smoking about 2 months ago. Her smoking use included Cigarettes. She has a 3.3 pack-year smoking history. She has never used smokeless tobacco. She reports that she drinks alcohol. She reports that she does not use illicit drugs.   Review of Systems: Constitutional:  admits to fever, chills, diaphoresis, appetite change and fatigue.  HEENT: denies photophobia, eye pain, redness, hearing loss, ear pain, congestion, sore throat, rhinorrhea, sneezing, neck pain, neck stiffness and tinnitus.  Respiratory: denies SOB, DOE, cough, chest tightness, and wheezing.  Cardiovascular: denies chest pain, palpitations and leg swelling.  Gastrointestinal: admits to nausea, vomiting, abdominal pain, diarrhea, constipation, blood in stool.  Genitourinary: denies dysuria, urgency, frequency, hematuria, flank pain and  difficulty urinating.  Musculoskeletal: denies  myalgias, back pain, joint swelling, arthralgias and gait problem.   Skin: denies pallor, rash and wound.  Neurological: denies dizziness, seizures, syncope, weakness, light-headedness, numbness and headaches.   Hematological: denies adenopathy, easy bruising, personal or family bleeding history.  Psychiatric/ Behavioral: denies suicidal ideation, mood changes, confusion, nervousness, sleep disturbance and agitation.    Physical Exam: BP 153/97 mmHg  Pulse 105  Temp(Src) 98.4 F (36.9 C) (Oral)  Resp 15  Ht 5\' 8"  (1.727 m)  Wt 89.994 kg (198 lb 6.4 oz)  BMI 30.17 kg/m2  SpO2 98%  LMP 12/31/2014  Wt Readings from Last 3 Encounters:  02/10/15 89.994 kg (198 lb 6.4 oz)  02/03/15 89.495 kg (197 lb 4.8 oz)  12/09/14 88.451 kg (195 lb)    General: Vital signs reviewed and noted. Well-developed, well-nourished, in no acute distress; alert,   Head: Normocephalic, atraumatic, sclera anicteric,   Neck: Supple. Negative for carotid bruits. No JVD   Lungs:  Clear bilaterally, no  wheezes, rales, or rhonchi. Breathing is normal   Heart: RRR with S1 S2. No murmurs, rubs, or gallops   Abdomen/ GI :  Soft, non-tender, non-distended with normoactive bowel sounds. No hepatomegaly. No rebound/guarding. No obvious abdominal masses   MSK: Strength and the appear normal for age.   Extremities: No clubbing or cyanosis. No edema.  Distal pedal pulses are 2+ and equal   Neurologic:  CN are grossly intact,  No obvious motor or sensory defect.  Alert and oriented X 3. Moves all extremities spontaneously.  Psych: Responds to questions appropriately with a normal affect.     Lab results: Basic Metabolic Panel:  Recent Labs Lab 02/04/15 0522 02/09/15 1241 02/09/15 1945 02/10/15 0657  NA 137 136  --  136  K 3.2* 3.5  --  3.8  CL 106 97*  --  104  CO2 27 23  --  22  GLUCOSE 104* 204*  --  101*  BUN 5* 9  --  7  CREATININE 0.91 1.04*  --  0.98    CALCIUM 8.9 10.3  --  8.4*  MG  --   --  1.6* 2.3    Liver Function Tests:  Recent Labs Lab 02/09/15 1241 02/10/15 0657  AST 43* 34  ALT 24 15  ALKPHOS 71 46  BILITOT 0.9 0.7  PROT 10.0* 6.7  ALBUMIN 4.4 3.2*    Recent Labs Lab 02/09/15 1241  LIPASE 12*   No results for input(s): AMMONIA in the last 168 hours.  CBC:  Recent Labs Lab 02/04/15 0522 02/09/15 1241 02/10/15 0657  WBC 6.1 14.9* 12.8*  HGB 11.1* 16.6* 12.7  HCT 33.3* 47.8* 38.8  MCV 79.9 79.8 81.0  PLT 237 437* 366    Cardiac Enzymes:  Recent Labs Lab 02/09/15 1945 02/10/15 0100 02/10/15 0657  TROPONINI 0.59* 0.62* 0.77*    BNP: Invalid input(s): POCBNP  CBG: No results for input(s): GLUCAP in the last 168 hours.  Coagulation Studies:  Recent Labs  02/09/15 1557  LABPROT 14.3  INR 1.09     Other results:  Personal review of EKG shows :  NSR,  Marked T wave inversion  - new from last week .    Imaging: Ct Abdomen Pelvis W Contrast  02/09/2015   CLINICAL DATA:  Initial encounter for left upper quadrant abdominal pain with vomiting and diarrhea.  EXAM: CT ABDOMEN AND PELVIS WITH CONTRAST  TECHNIQUE: Multidetector CT imaging of the abdomen and pelvis was performed using the standard protocol following bolus administration of intravenous contrast.  CONTRAST:  172mL OMNIPAQUE IOHEXOL 300 MG/ML  SOLN  COMPARISON:  01/30/2015.  FINDINGS: Lower chest:  Probable atelectasis in the lingula.  Hepatobiliary: No focal abnormality within the liver parenchyma. Tiny layering gallstones are noted in the dependent gallbladder (see image 62 of series 6). No evidence for gallbladder wall thickening or pericholecystic fluid. No intrahepatic or extrahepatic biliary dilation.  Pancreas: No focal mass lesion. No dilatation of the main duct. No intraparenchymal cyst. No peripancreatic edema.  Spleen: No splenomegaly.  No focal mass lesion.  Adrenals/Urinary Tract: No adrenal  nodule or mass. The patient has at  least 5 nonobstructing stones in the right kidney, the largest measuring about 3 mm. Three stones are seen in the left kidney without obstruction, all measuring approximately 4-5 mm. No enhancing lesion is seen in either kidney. There is no evidence for hydroureter. No ureteral or bladder stone.  Stomach/Bowel: Stomach is nondistended. No gastric wall thickening. No evidence of outlet obstruction. Duodenum is normally positioned as is the ligament of Treitz. No small bowel wall thickening. No small bowel dilatation. The terminal ileum is normal. The appendix is normal. Diverticular are seen in the region of the splenic flexure without diverticulitis of the colon.  Vascular/Lymphatic: There is abdominal aortic atherosclerosis without aneurysm. There is no gastrohepatic or hepatoduodenal ligament lymphadenopathy. No intraperitoneal or retroperitoneal lymphadenopy. No mesenteric or pelvic sidewall lymphadenopathy.  Reproductive: Uterus is unremarkable. 3.8 cm cystic lesion identified in the right adnexal space. Small to moderate volume intraperitoneal free fluid is evident. This is similar in appearance to the previous study.  Other: Gas within the subcutaneous fat of the lower anterior right abdominal wall likely related to an injection site.  Musculoskeletal: Bone windows reveal no worrisome lytic or sclerotic osseous lesions.  IMPRESSION: 1. 3.8 cm cystic lesion in the right adnexal space with moderate volume intraperitoneal free fluid. This is more fluid than typically seen for cyst rupture. Pelvic ultrasound may prove helpful to further evaluate, as clinically warranted. 2. Tiny layering calcified stones in the gallbladder lumen appear to be new in the interval since the prior study. 3. Multiple bilateral nonobstructing renal stones.   Electronically Signed   By: Misty Stanley M.D.   On: 02/09/2015 19:38   Dg Chest Portable 1 View  02/09/2015   CLINICAL DATA:  Kidney a weakness shortness of breath  EXAM:  PORTABLE CHEST - 1 VIEW  COMPARISON:  01/30/2015  FINDINGS: Heart size within normal limits allowing for rotation and lordotic positioning. Vascular pattern normal. Lungs clear. Persistent minimal left costophrenic angle blunting.  IMPRESSION: Stable mild left blunting of the costophrenic angle suggesting pleural thickening or mild effusion.   Electronically Signed   By: Skipper Cliche M.D.   On: 02/09/2015 13:52        Assessment & Plan:  1. Non-ST segment elevation myocardial  infarction: The patient presents with gastroenteritis, severe hypertension and sepsis syndrome. She had troponin elevations .  She denies any chest pain . Her ECG shows marked T wave inversions in the anterolateral leads which is new since her previous ECG yesterday . This may represent a Takotsubo syndrome  Her Echo in May, 2016 showed normal LV function with EF of 50-55% Will repeat her echo given the Troponin elevation and eCG changes.   She will likely need a cath when she has improved from her GI issues.    2.  Hypertension:  Continue current meds.       Thayer Headings, Brooke Bonito., MD, [facility] 02/10/2015, 9:27 AM Office - [phone] Pager [phone]

## 2015-02-10 NOTE — Progress Notes (Signed)
[place] TEAM 1 - Stepdown/ICU TEAM Progress Note  Emily Key KZL:[id number] DOB: 08/25/1970 DOA: 02/09/2015 PCP: [name], MD  Admit HPI / Brief Narrative: Emily Key is a 44 y.o. BF PMHx Hypertension, peripartum CHF, empyema + lung abscess LLL 11/2014 S/P left VATS  She was admitted from 7/22-7/27 with presumed enteritis. Her GI pathogen panel during that admission was completely negative. She was discharged on 7 days of Cipro/Flagyl. She went home and was doing well. She was almost finished with her antibiotic therapy and eating solid food. She states her bowel movements had not completely normalized but were better. Yesterday at dinner she ate fried chicken and green beans. Suddenly on 7/31 close to midnight her abdominal pain returned and she began to vomit and had dark diarrhea. She reports having a minimum of 5 diarrhea/vomiting episodes overnight. Her abdominal pain both upper quadrants is severe. Nothing seemed to relieve it at home. It has been relieved by hydromorphone in the ER. Her blood pressure the time of admission was 216/144, temperature 100.3, pulse rate 139, respirations 42, her white count is 14.9 hemoglobin 16.6, platelets 437. Fortunately her creatinine is 1.04.  She is being admitted to the stepdown unit for severe sepsis.  HPI/Subjective: 8/2 patient states positive CP (described as pressure), states has been waxing and waning since May. States she did not mention because he thought it was secondary to recent VATS in May. Rates pain at 5/10, negative radiation positive N/V, (unsure if secondary to abdominal pain or CP). Negative blood in stool    Assessment/Plan: Severe sepsis with lactic acidosis. Appears to be secondary to intraabdominal source. Admitted under sepsis protocol to a step down bed. Have asked E-link to monitor. Blood and urine cultures are pending.  -Vanc/Zosyn started. Will receive 4L of IVF. - Abdominal pain, vomiting, diarrhea. -At  discharge on 7/27 previous GI pathogen panel negative -GI pathogen panel pending -Stool culture pending -CT abdomen pelvis; cystic lesion right adnexal space; see results below. Ultrasound pending -Hepatitis panel negative  NSTEMI? -Spoke with Dr. Grayland Jack (cardiology) patient with EKG changes and increasing troponin feels patient will require cardiac cath after medically stable. -Continue trending troponins -Nitropaste 1 inch  Hypertensive urgency -This may be rebound hypertension from clonidine as she has been unable to take her medications due to vomiting. Clonidine patch 0.2 mg -Will hold labetalol until nitroglycerin patch and clonidine patch had been placed. Would then start on a low dose. -Continue hydralazine PRN SBP> 160  Left lung abscess/empyema -S/P VATS 11/2014  Substance abuse -Positive for marijuana   Code Status: FULL Family Communication: no family present at time of exam Disposition Plan: Resolution NSTEMI    Consultants: Dr. Grayland Jack (cardiology)   Procedure/Significant Events: 8/1 CT abdomen pelvis with contrast;. -3.8 cm cystic lesion in the right adnexal space with moderate volume intraperitoneal free fluid. More fluid than typically seen for cyst rupture. Pelvic ultrasound may prove helpful to further evaluate, as clinically warranted. -Tiny layering calcified stones in the gallbladder lumen appear to be new in the interval since the prior study. -Multiple bilateral nonobstructing renal stones.   Culture 8/1 urine pending 8/1 left AC 2 pending 8/1 MRSA by PCR negative   Antibiotics: Zosyn 8/1>> Vancomycin 8/1>>  DVT prophylaxis: Subcutaneous heparin   Devices    LINES / TUBES:       Continuous Infusions: . 0.9 % NaCl with KCl 40 mEq / L 100 mL/hr (02/10/15 0325)    Objective: VITAL SIGNS: Temp: 98.4  F (36.9 C) (08/02 0700) Temp Source: Oral (08/02 0700) BP: 153/97 mmHg (08/02 0700) Pulse Rate: 105 (08/02  0700) SPO2; FIO2:   Intake/Output Summary (Last 24 hours) at 02/10/15 1053 Last data filed at 02/10/15 0800  Gross per 24 hour  Intake   1700 ml  Output    825 ml  Net    875 ml     Exam: General: A/O 4, uncomfortable secondary to abdominal/chest pain, No acute respiratory distress Eyes: Negative headache, eye pain, double vision, negative scleral hemorrhage ENT: Negative Runny nose, negative ear pain, negative tinnitus, negative gingival bleeding,  Neck:  Negative scars, masses, torticollis, lymphadenopathy, JVD Lungs: Clear to auscultation bilaterally without wheezes or crackles Cardiovascular: Tachycardic, Regular rhythm without murmur gallop or rub normal S1 and S2 Abdomen: Positive diffuse abdominal pain greatest in the LUQ/LLQ, negative dysphagia,nondistended, soft, bowel sounds positive, no rebound, no ascites, no appreciable mass Extremities: No significant cyanosis, clubbing, or edema bilateral lower extremities Psychiatric:  Negative depression, negative anxiety, negative fatigue, negative mania  Neurologic:  Cranial nerves II through XII intact, tongue/uvula midline, all extremities muscle strength 5/5, sensation intact throughout,negative dysarthria, negative expressive aphasia, negative receptive aphasia.    Data Reviewed: Basic Metabolic Panel:  Recent Labs Lab 02/04/15 0522 02/09/15 1241 02/09/15 1945 02/10/15 0657  NA 137 136  --  136  K 3.2* 3.5  --  3.8  CL 106 97*  --  104  CO2 27 23  --  22  GLUCOSE 104* 204*  --  101*  BUN 5* 9  --  7  CREATININE 0.91 1.04*  --  0.98  CALCIUM 8.9 10.3  --  8.4*  MG  --   --  1.6* 2.3   Liver Function Tests:  Recent Labs Lab 02/09/15 1241 02/10/15 0657  AST 43* 34  ALT 24 15  ALKPHOS 71 46  BILITOT 0.9 0.7  PROT 10.0* 6.7  ALBUMIN 4.4 3.2*    Recent Labs Lab 02/09/15 1241  LIPASE 12*   No results for input(s): AMMONIA in the last 168 hours. CBC:  Recent Labs Lab 02/04/15 0522 02/09/15 1241  02/10/15 0657  WBC 6.1 14.9* 12.8*  HGB 11.1* 16.6* 12.7  HCT 33.3* 47.8* 38.8  MCV 79.9 79.8 81.0  PLT 237 437* 366   Cardiac Enzymes:  Recent Labs Lab 02/09/15 1945 02/10/15 0100 02/10/15 0657  TROPONINI 0.59* 0.62* 0.77*   BNP (last 3 results)  Recent Labs  11/12/14 0355 02/09/15 1557  BNP 138.6* 3120.5*    ProBNP (last 3 results) No results for input(s): PROBNP in the last 8760 hours.  CBG: No results for input(s): GLUCAP in the last 168 hours.  Recent Results (from the past 240 hour(s))  Ova and parasite examination     Status: None   Collection Time: 02/03/15 10:03 PM  Result Value Ref Range Status   Specimen Description STOOL  Final   Special Requests NONE  Final   Ova and parasites   Final    NO OVA OR PARASITES SEEN Performed at [facility]    Report Status 02/04/2015 FINAL  Final  Stool culture     Status: None   Collection Time: 02/03/15 10:03 PM  Result Value Ref Range Status   Specimen Description STOOL  Final   Special Requests NONE  Final   Culture   Final    NO SALMONELLA, SHIGELLA, CAMPYLOBACTER, YERSINIA, OR E.COLI 0157:H7 ISOLATED Performed at [facility]    Report Status 02/07/2015  FINAL  Final  Blood culture (routine x 2)     Status: None (Preliminary result)   Collection Time: 02/09/15  1:22 PM  Result Value Ref Range Status   Specimen Description BLOOD LEFT ANTECUBITAL  Final   Special Requests BOTTLES DRAWN AEROBIC AND ANAEROBIC 5CC  Final   Culture PENDING  Incomplete   Report Status PENDING  Incomplete  MRSA PCR Screening     Status: None   Collection Time: 02/09/15  4:56 PM  Result Value Ref Range Status   MRSA by PCR NEGATIVE NEGATIVE Final    Comment:        The GeneXpert MRSA Assay (FDA approved for NASAL specimens only), is one component of a comprehensive MRSA colonization surveillance program. It is not intended to diagnose MRSA infection nor to guide or monitor treatment for MRSA infections.       Studies:  Recent x-ray studies have been reviewed in detail by the Attending Physician  Scheduled Meds:  Scheduled Meds: . acetaminophen  650 mg Oral Once  . cloNIDine  0.2 mg Transdermal Weekly  . heparin subcutaneous  5,000 Units Subcutaneous 3 times per day  . nitroGLYCERIN  1 inch Topical 4 times per day  . piperacillin-tazobactam (ZOSYN)  IV  3.375 g Intravenous Q8H  . pneumococcal 23 valent vaccine  0.5 mL Intramuscular Tomorrow-1000  . sodium chloride  3 mL Intravenous Q12H  . vancomycin  1,000 mg Intravenous Q12H    Time spent on care of this patient: 40 mins   Isacc Turney, Geraldo Docker , MD  [facility] Office  [phone] Pager - [phone]  On-Call/Text Page:      [email or web address]      password [email or web address]  If 7PM-7AM, please contact night-coverage [email or web address] Password [email or web address] 02/10/2015, 10:53 AM   LOS: 1 day   Care during the described time interval was provided by me .  I have reviewed this patient's available data, including medical history, events of note, physical examination, and all test results as part of my evaluation. I have personally reviewed and interpreted all radiology studies.   Dia Crawford, MD [phone] Pager

## 2015-02-10 NOTE — Progress Notes (Signed)
Utilization review completed. Jerrelle Michelsen, RN, BSN. 

## 2015-02-10 NOTE — Progress Notes (Signed)
  Echocardiogram 2D Echocardiogram has been performed.  Donata Clay 02/10/2015, 3:25 PM

## 2015-02-10 NOTE — Progress Notes (Addendum)
ADDENDUM: MD changed back to SQ heparin before heparin gtt started.  ANTICOAGULATION CONSULT NOTE - Initial Consult  Pharmacy Consult for Heparin Indication: chest pain/ACS  No Known Allergies  Patient Measurements: Height: 5\' 8"  (172.7 cm) Weight: 193 lb 12.6 oz (87.9 kg) IBW/kg (Calculated) : 63.9 Heparin Dosing Weight: 82 kg  Vital Signs: Temp: 97.9 F (36.6 C) (08/02 0000) Temp Source: Oral (08/02 0000) BP: 163/93 mmHg (08/02 0000) Pulse Rate: 102 (08/02 0000)  Labs:  Recent Labs  02/09/15 1241 02/09/15 1557 02/09/15 1945 02/10/15 0100  HGB 16.6*  --   --   --   HCT 47.8*  --   --   --   PLT 437*  --   --   --   APTT  --  29  --   --   LABPROT  --  14.3  --   --   INR  --  1.09  --   --   CREATININE 1.04*  --   --   --   TROPONINI  --   --  0.59* 0.62*    Estimated Creatinine Clearance: 79.3 mL/min (by C-G formula based on Cr of 1.04).   Medical History: Past Medical History  Diagnosis Date  . Coronary artery disease   . Hypertension   . CHF (congestive heart failure)     after last pregnancy  . Nausea, vomiting and diarrhea 01/30/2015  . Heart murmur     "born w/one":  Marland Kitchen Pneumonia 11/2014  . History of hiatal hernia   . Daily headache     Medications:  Prescriptions prior to admission  Medication Sig Dispense Refill Last Dose  . amLODipine (NORVASC) 10 MG tablet Take 1 tablet (10 mg total) by mouth daily. 30 tablet 3 02/08/2015 at Unknown time  . cloNIDine (CATAPRES) 0.2 MG tablet Take 1 tablet (0.2 mg total) by mouth 3 (three) times daily. 90 tablet 3 02/08/2015 at Unknown time  . labetalol (NORMODYNE) 100 MG tablet Take 1 tablet (100 mg total) by mouth 2 (two) times daily. 60 tablet 0 02/08/2015 at 2300  . metroNIDAZOLE (FLAGYL) 500 MG tablet Take 1 tablet (500 mg total) by mouth every 8 (eight) hours. 6 tablet 0 02/08/2015 at Unknown time    Assessment: 44 y.o. female presented with abd pain, emesis, and diarrhea. Known to pharmacy from abx  dosing at admission. Pt now with positive troponins - to start heparin for r/o ACS. H/H, plt elevated at baseline. SQ heparin 5000 units given ~0120.  Goal of Therapy:  Heparin level 0.3-0.7 units/ml Monitor platelets by anticoagulation protocol: Yes   Plan:  Heparin 4000 units IV bolus Heparin gtt at 1000 units/hr Will f/u heparin level in 6 hours Daily heparin level and CBC  Sherlon Handing, PharmD, BCPS Clinical pharmacist, pager [phone] 02/10/2015,3:37 AM

## 2015-02-10 NOTE — Progress Notes (Signed)
02/10/2015 patient blood pressure was running 170's, 180's to 190's, notified Dr Sherral Hammers at 1815  And order was given for EKG, lopressor and nitro drip ordered. Also MD was made aware patient been in vent bigeminy. [facility] RN.

## 2015-02-10 NOTE — Progress Notes (Signed)
02/10/2015 pneumococcal vaccine given at 1417 in right deltoid. Lot # [id number] and expire April 17, 2016. [employer] claims handler.

## 2015-02-10 NOTE — Progress Notes (Signed)
Troponins ordered on admission due to accelerated HTN. Troponin earlier in shift .59. Pt was w/out any chest pain at that point. Next trop .62. This NP spoke to RN who stated pt had c/o "chest pressure" w/out radiation, diaphoresis or vomiting. However, the pt has slept a lot tonight. Pt is here with abd pain (? Causing the chest pressure). 12 lead done. Cardio on call, Dr. Simeon Craft, called. Discussed pt, tests, and situation. He advised no Heparin drip, ASA 81mg  now, and cards will see her this am.  Discussed plan with RN, Jinny Blossom.

## 2015-02-11 DIAGNOSIS — N888 Other specified noninflammatory disorders of cervix uteri: Secondary | ICD-10-CM

## 2015-02-11 DIAGNOSIS — N941 Dyspareunia: Secondary | ICD-10-CM

## 2015-02-11 DIAGNOSIS — L0291 Cutaneous abscess, unspecified: Secondary | ICD-10-CM

## 2015-02-11 DIAGNOSIS — N7011 Chronic salpingitis: Secondary | ICD-10-CM

## 2015-02-11 DIAGNOSIS — N8320 Unspecified ovarian cysts: Secondary | ICD-10-CM

## 2015-02-11 DIAGNOSIS — I10 Essential (primary) hypertension: Secondary | ICD-10-CM

## 2015-02-11 LAB — COMPREHENSIVE METABOLIC PANEL
ALT: 15 U/L (ref 14–54)
ANION GAP: 8 (ref 5–15)
AST: 33 U/L (ref 15–41)
Albumin: 3.3 g/dL — ABNORMAL LOW (ref 3.5–5.0)
Alkaline Phosphatase: 42 U/L (ref 38–126)
CALCIUM: 8.8 mg/dL — AB (ref 8.9–10.3)
CO2: 22 mmol/L (ref 22–32)
CREATININE: 0.88 mg/dL (ref 0.44–1.00)
Chloride: 104 mmol/L (ref 101–111)
GFR calc Af Amer: >60 mL/min (ref >60)
GFR calc non Af Amer: >60 mL/min (ref >60)
Glucose, Bld: 130 mg/dL — ABNORMAL HIGH (ref 65–99)
Potassium: 4.3 mmol/L (ref 3.5–5.1)
Sodium: 134 mmol/L — ABNORMAL LOW (ref 135–145)
TOTAL PROTEIN: 6.6 g/dL (ref 6.5–8.1)
Total Bilirubin: 0.9 mg/dL (ref 0.3–1.2)

## 2015-02-11 LAB — CBC WITH DIFFERENTIAL/PLATELET
Basophils Absolute: 0.1 10*3/uL (ref 0.0–0.1)
Basophils Relative: 1 % (ref 0–1)
EOS ABS: 0.3 10*3/uL (ref 0.0–0.7)
Eosinophils Relative: 3 % (ref 0–5)
HCT: 37.2 % (ref 36.0–46.0)
Hemoglobin: 12.3 g/dL (ref 12.0–15.0)
LYMPHS ABS: 2.7 10*3/uL (ref 0.7–4.0)
Lymphocytes Relative: 27 % (ref 12–46)
MCH: 27.2 pg (ref 26.0–34.0)
MCHC: 33.1 g/dL (ref 30.0–36.0)
MCV: 82.1 fL (ref 78.0–100.0)
MONOS PCT: 7 % (ref 3–12)
Monocytes Absolute: 0.7 10*3/uL (ref 0.1–1.0)
NEUTROS PCT: 62 % (ref 43–77)
Neutro Abs: 6.2 10*3/uL (ref 1.7–7.7)
PLATELETS: 354 10*3/uL (ref 150–400)
RBC: 4.53 MIL/uL (ref 3.87–5.11)
RDW: 17.5 % — ABNORMAL HIGH (ref 11.5–15.5)
WBC: 10 10*3/uL (ref 4.0–10.5)

## 2015-02-11 LAB — LACTIC ACID, PLASMA: LACTIC ACID, VENOUS: 1 mmol/L (ref 0.5–2.0)

## 2015-02-11 LAB — URINE CULTURE: Culture: NO GROWTH

## 2015-02-11 LAB — TROPONIN I
TROPONIN I: 1.71 ng/mL — AB (ref <0.031)
Troponin I: 1.74 ng/mL (ref <0.031)

## 2015-02-11 LAB — MAGNESIUM: MAGNESIUM: 1.9 mg/dL (ref 1.7–2.4)

## 2015-02-11 MED ORDER — HYDRALAZINE HCL 20 MG/ML IJ SOLN
20.0000 mg | Freq: Four times a day (QID) | INTRAMUSCULAR | Status: DC
  Administered 2015-02-11 – 2015-02-14 (×10): 20 mg via INTRAVENOUS
  Filled 2015-02-11 (×14): qty 1

## 2015-02-11 MED ORDER — AMLODIPINE BESYLATE 10 MG PO TABS
10.0000 mg | ORAL_TABLET | Freq: Every day | ORAL | Status: DC
  Administered 2015-02-11 – 2015-02-15 (×5): 10 mg via ORAL
  Filled 2015-02-11 (×5): qty 1

## 2015-02-11 MED ORDER — HYDRALAZINE HCL 20 MG/ML IJ SOLN
10.0000 mg | Freq: Four times a day (QID) | INTRAMUSCULAR | Status: DC
  Administered 2015-02-11 (×3): 10 mg via INTRAVENOUS
  Filled 2015-02-11 (×2): qty 1

## 2015-02-11 MED ORDER — CLONIDINE HCL 0.3 MG/24HR TD PTWK: 0.3000 mg | MEDICATED_PATCH | TRANSDERMAL | Status: DC

## 2015-02-11 MED ORDER — LABETALOL HCL 5 MG/ML IV SOLN
10.0000 mg | Freq: Four times a day (QID) | INTRAVENOUS | Status: DC
Start: 2015-02-11 — End: 2015-02-12
  Administered 2015-02-11 – 2015-02-12 (×4): 10 mg via INTRAVENOUS
  Filled 2015-02-11 (×8): qty 4

## 2015-02-11 MED ORDER — SODIUM CHLORIDE 0.9 % IV SOLN
INTRAVENOUS | Status: DC
  Administered 2015-02-11: 11:00:00 via INTRAVENOUS

## 2015-02-11 NOTE — Progress Notes (Signed)
0135-Pt blood pressure 188/100. Increased Nitro gtt to 42mcg and patient was given prn hydralazine. BP's still 171/74 and 168/97. Updated K.Kirby, NP on blood pressure. No new orders at this time. Patient will received scheduled lopressor. Continue gtt at current rate.  M.Forest Gleason, RN

## 2015-02-11 NOTE — Clinical Documentation Improvement (Signed)
  Chronic Kidney Disease is documented in the medical record.  Please specify the disease stage if possible. BUN= 9 Crea= 1.04 GFR= > 60  CKD Stage I - GFR > or = 90 CKD Stage II - GFR = 60-80 CKD Stage III - GFR = 30-59 CKD Stage IV - GFR = 15-29 CKD Stage V - GFR = < 15 ESRD Unable to suspect or determine  Thank you!  Pryor Montes, BSN, RN [place] HIM/Clinical Documentation Specialist [email or web address] [phone]

## 2015-02-11 NOTE — Consult Note (Addendum)
Reason for Consult:rULE OUT TOA Referring Physician:   Raimi Guillermo is an 44 y.o. female. WHO WAS admitted, 8/1, for the second time in 2 weeks for upper abdominal pain. CT abd/pelvis has been done today, showing a cystic structure in the right adnexa with several loculations. The possibility of tubo-ovarian abscess and abscess is being considered. Consult is thus requested. History is significant for long-standing tubal ligation, with last GYN exam over 5 years ago. She's been a single partner relationship 2 years, has noted dyspareunia on the right side for at least 6 months. She has no history of sexually transmitted infections that she recalls. Only 2 partners last 10 years.    Pertinent Gynecological History: Menses: Menses are irregular, last LMP early June Bleeding: None Contraception: tubal ligation DES exposure: unknown Blood transfusions: none Sexually transmitted diseases: no past history Previous GYN Procedures: Tubal ligation  Last mammogram: None Date:  Last pap: None N records Date:  OB History: G2, P2   Menstrual History: Menarche age:  Patient's last menstrual period was 12/31/2014.    Past Medical History  Diagnosis Date  . Hypertension   . CHF (congestive heart failure)     after last pregnancy  . Nausea, vomiting and diarrhea 01/30/2015  . Heart murmur     "born w/one":  Marland Kitchen Pneumonia 11/2014  . History of hiatal hernia   . Daily headache     Past Surgical History  Procedure Laterality Date  . Video assisted thoracoscopy (vats)/decortication Left 11/14/2014    Procedure: LEFT VIDEO ASSISTED THORACOSCOPY (VATS)/DECORTICATION;  Surgeon: Melrose Nakayama, MD;  Location: [place];  Service: Thoracic;  Laterality: Left;  . Pleural effusion drainage Left 11/14/2014    Procedure: DRAINAGE OF LEFT PLEURAL EFFUSION;  Surgeon: Melrose Nakayama, MD;  Location: [place];  Service: Thoracic;  Laterality: Left;  . Tubal ligation  2000  . Tonsillectomy and adenoidectomy   ~ 1986    Family History  Problem Relation Age of Onset  . Diabetes Mellitus II Mother   . Hypertension Mother   . Lung cancer Father     Social History:  reports that she quit smoking about 3 months ago. Her smoking use included Cigarettes. She has a 3.3 pack-year smoking history. She has never used smokeless tobacco. She reports that she drinks alcohol. She reports that she does not use illicit drugs.  Allergies: No Known Allergies  Medications: I have reviewed the patient's current medications.  ROS  Blood pressure 170/83, pulse 125, temperature 98.3 F (36.8 C), temperature source Oral, resp. rate 28, height $RemoveBe'5\' 8"'cfKAQqCpp$  (1.727 m), weight 89.132 kg (196 lb 8 oz), last menstrual period 12/31/2014, SpO2 100 %. Physical Exam  Constitutional: She appears well-developed and well-nourished. No distress.  HENT:  Head: Normocephalic and atraumatic.  Eyes: Pupils are equal, round, and reactive to light.  Neck: Normal range of motion. Neck supple.  Cardiovascular: Normal rate.   Respiratory: Effort normal.  GI: Soft. Bowel sounds are normal. She exhibits no distension and no mass. There is tenderness. There is rebound and guarding.  Genitourinary: Vagina normal.  Normal external genitalia bimanual exam shows that the cervix is enlarged moderate descensus. Bladder is nontender the uterus is mildly tender to manipulation. The left adnexa does not have any masses or fullness or tenderness. The right adnexa has some cystic fullness is moderately tender.  Musculoskeletal: Normal range of motion.  Neurological: She is alert.  Skin: Skin is warm and dry.  Psychiatric: She has a normal mood  and affect. Her behavior is normal. Judgment and thought content normal.   the tenderness is primarily epigastric, both upper quadrants, both sides. The patient has some guarding and rebound tenderness in both upper quadrants. Lower quadrant showed slight tenderness to deep palpation but distinctly less by patient  specific questioning.  Review of ultrasound shows that she has a cyst on the right ovary, and a multiloculated tubular cystic structure above the ovary, most consistent with a right hydrosalpinx. The hydrosalpinx has relatively clear fluid. Cul-de-sac is examined and is distinctly less full, does not feel to be loculated. The ultrasound shows this significant amount of pelvic free fluid.   Results for orders placed or performed during the hospital encounter of 02/09/15 (from the past 48 hour(s))  Troponin I (q 6hr x 3)     Status: Abnormal   Collection Time: 02/10/15  1:00 AM  Result Value Ref Range   Troponin I 0.62 (HH) <0.031 ng/mL    Comment:        POSSIBLE MYOCARDIAL ISCHEMIA. SERIAL TESTING RECOMMENDED. CRITICAL VALUE NOTED.  VALUE IS CONSISTENT WITH PREVIOUSLY REPORTED AND CALLED VALUE. REPEATED TO VERIFY   Comprehensive metabolic panel     Status: Abnormal   Collection Time: 02/10/15  6:57 AM  Result Value Ref Range   Sodium 136 135 - 145 mmol/L   Potassium 3.8 3.5 - 5.1 mmol/L   Chloride 104 101 - 111 mmol/L   CO2 22 22 - 32 mmol/L   Glucose, Bld 101 (H) 65 - 99 mg/dL   BUN 7 6 - 20 mg/dL   Creatinine, Ser 0.98 0.44 - 1.00 mg/dL   Calcium 8.4 (L) 8.9 - 10.3 mg/dL   Total Protein 6.7 6.5 - 8.1 g/dL   Albumin 3.2 (L) 3.5 - 5.0 g/dL   AST 34 15 - 41 U/L   ALT 15 14 - 54 U/L   Alkaline Phosphatase 46 38 - 126 U/L   Total Bilirubin 0.7 0.3 - 1.2 mg/dL   GFR calc non Af Amer >60 >60 mL/min   GFR calc Af Amer >60 >60 mL/min    Comment: (NOTE) The eGFR has been calculated using the CKD EPI equation. This calculation has not been validated in all clinical situations. eGFR's persistently <60 mL/min signify possible Chronic Kidney Disease.    Anion gap 10 5 - 15  Troponin I (q 6hr x 3)     Status: Abnormal   Collection Time: 02/10/15  6:57 AM  Result Value Ref Range   Troponin I 0.77 (HH) <0.031 ng/mL    Comment:        POSSIBLE MYOCARDIAL ISCHEMIA. SERIAL  TESTING RECOMMENDED. REPEATED TO VERIFY CRITICAL VALUE NOTED.  VALUE IS CONSISTENT WITH PREVIOUSLY REPORTED AND CALLED VALUE.   Magnesium     Status: None   Collection Time: 02/10/15  6:57 AM  Result Value Ref Range   Magnesium 2.3 1.7 - 2.4 mg/dL  CBC     Status: Abnormal   Collection Time: 02/10/15  6:57 AM  Result Value Ref Range   WBC 12.8 (H) 4.0 - 10.5 K/uL   RBC 4.79 3.87 - 5.11 MIL/uL   Hemoglobin 12.7 12.0 - 15.0 g/dL    Comment: REPEATED TO VERIFY   HCT 38.8 36.0 - 46.0 %   MCV 81.0 78.0 - 100.0 fL   MCH 26.5 26.0 - 34.0 pg   MCHC 32.7 30.0 - 36.0 g/dL   RDW 17.7 (H) 11.5 - 15.5 %   Platelets 366 150 -  400 K/uL  Urine culture     Status: None   Collection Time: 02/10/15  7:16 AM  Result Value Ref Range   Specimen Description URINE, RANDOM    Special Requests NONE    Culture NO GROWTH 1 DAY    Report Status 02/11/2015 FINAL   Troponin I (q 6hr x 3)     Status: Abnormal   Collection Time: 02/10/15  7:25 PM  Result Value Ref Range   Troponin I 1.53 (HH) <0.031 ng/mL    Comment:        POSSIBLE MYOCARDIAL ISCHEMIA. SERIAL TESTING RECOMMENDED. REPEATED TO VERIFY CRITICAL VALUE NOTED.  VALUE IS CONSISTENT WITH PREVIOUSLY REPORTED AND CALLED VALUE.   Lactic acid, plasma     Status: None   Collection Time: 02/11/15 12:53 AM  Result Value Ref Range   Lactic Acid, Venous 1.0 0.5 - 2.0 mmol/L  Troponin I (q 6hr x 3)     Status: Abnormal   Collection Time: 02/11/15 12:53 AM  Result Value Ref Range   Troponin I 1.71 (HH) <0.031 ng/mL    Comment:        POSSIBLE MYOCARDIAL ISCHEMIA. SERIAL TESTING RECOMMENDED. REPEATED TO VERIFY CRITICAL VALUE NOTED.  VALUE IS CONSISTENT WITH PREVIOUSLY REPORTED AND CALLED VALUE.   Comprehensive metabolic panel     Status: Abnormal   Collection Time: 02/11/15  7:10 AM  Result Value Ref Range   Sodium 134 (L) 135 - 145 mmol/L   Potassium 4.3 3.5 - 5.1 mmol/L   Chloride 104 101 - 111 mmol/L   CO2 22 22 - 32 mmol/L   Glucose,  Bld 130 (H) 65 - 99 mg/dL   BUN <5 (L) 6 - 20 mg/dL   Creatinine, Ser 0.88 0.44 - 1.00 mg/dL   Calcium 8.8 (L) 8.9 - 10.3 mg/dL   Total Protein 6.6 6.5 - 8.1 g/dL   Albumin 3.3 (L) 3.5 - 5.0 g/dL   AST 33 15 - 41 U/L   ALT 15 14 - 54 U/L   Alkaline Phosphatase 42 38 - 126 U/L   Total Bilirubin 0.9 0.3 - 1.2 mg/dL   GFR calc non Af Amer >60 >60 mL/min   GFR calc Af Amer >60 >60 mL/min    Comment: (NOTE) The eGFR has been calculated using the CKD EPI equation. This calculation has not been validated in all clinical situations. eGFR's persistently <60 mL/min signify possible Chronic Kidney Disease.    Anion gap 8 5 - 15  CBC with Differential/Platelet     Status: Abnormal   Collection Time: 02/11/15  7:10 AM  Result Value Ref Range   WBC 10.0 4.0 - 10.5 K/uL   RBC 4.53 3.87 - 5.11 MIL/uL   Hemoglobin 12.3 12.0 - 15.0 g/dL   HCT 37.2 36.0 - 46.0 %   MCV 82.1 78.0 - 100.0 fL   MCH 27.2 26.0 - 34.0 pg   MCHC 33.1 30.0 - 36.0 g/dL   RDW 17.5 (H) 11.5 - 15.5 %   Platelets 354 150 - 400 K/uL   Neutrophils Relative % 62 43 - 77 %   Neutro Abs 6.2 1.7 - 7.7 K/uL   Lymphocytes Relative 27 12 - 46 %   Lymphs Abs 2.7 0.7 - 4.0 K/uL   Monocytes Relative 7 3 - 12 %   Monocytes Absolute 0.7 0.1 - 1.0 K/uL   Eosinophils Relative 3 0 - 5 %   Eosinophils Absolute 0.3 0.0 - 0.7 K/uL   Basophils Relative  1 0 - 1 %   Basophils Absolute 0.1 0.0 - 0.1 K/uL  Magnesium     Status: None   Collection Time: 02/11/15  7:10 AM  Result Value Ref Range   Magnesium 1.9 1.7 - 2.4 mg/dL  Troponin I (q 6hr x 3)     Status: Abnormal   Collection Time: 02/11/15  7:10 AM  Result Value Ref Range   Troponin I 1.74 (HH) <0.031 ng/mL    Comment:        POSSIBLE MYOCARDIAL ISCHEMIA. SERIAL TESTING RECOMMENDED. REPEATED TO VERIFY CRITICAL VALUE NOTED.  VALUE IS CONSISTENT WITH PREVIOUSLY REPORTED AND CALLED VALUE.     US Transvaginal Non-ob  02/10/2015   CLINICAL DATA:  Right adnexal cystic lesion with  associated intraperitoneal free fluid.  EXAM: TRANSABDOMINAL AND TRANSVAGINAL ULTRASOUND OF PELVIS  TECHNIQUE: Both transabdominal and transvaginal ultrasound examinations of the pelvis were performed. Transabdominal technique was performed for global imaging of the pelvis including uterus, ovaries, adnexal regions, and pelvic cul-de-sac. It was necessary to proceed with endovaginal exam following the transabdominal exam to visualize the right adnexa.  COMPARISON:  CT of the abdomen pelvis 02/09/2015  FINDINGS: Uterus  Measurements: 9.0 by 5.7 by 5.9 cm. There is a posterior fundal intramural circumscribed soft tissue mass with echogenicity similar to the myometrium likely representing a leiomyoma, which measures 4.2 by 3.3 by 3.7 cm.  Endometrium  Thickness: 6.3 mm.  No focal abnormality visualized.  Right ovary  Measurements: 5.8 x 3.5 x 4.5 cm. It contains 2 thick-walled loculated cystic structures demonstrating intramural low level echogenicity. The larger of these measures 3.2 x 3.2 x 3.7 cm, and the smaller one measures 2.3 x 2.3 x 2.3 cm. No internal blood flow is seen.  Left ovary  Measurements: 2.4 x 1.9 by 2.2. Normal appearance/no adnexal mass.  Other findings  Moderate amount of free fluid is present in the cul-de-sac and within the right adnexa.  IMPRESSION: Thick wall multiloculated cystic structure within the right adnexa. Differential diagnosis includes hemorrhagic cysts, or tubo-ovarian abscess. Ovarian torsion and solid hemorrhagic right adnexal mass are felt less likely, although theoretically possible.  Moderate amount of free fluid in the pelvis.  4.2 cm posterior fundal uterine leiomyoma.   Electronically Signed   By: Fidela Salisbury M.D.   On: 02/10/2015 14:27   US Pelvis Complete  02/10/2015   CLINICAL DATA:  Right adnexal cystic lesion with associated intraperitoneal free fluid.  EXAM: TRANSABDOMINAL AND TRANSVAGINAL ULTRASOUND OF PELVIS  TECHNIQUE: Both transabdominal and transvaginal  ultrasound examinations of the pelvis were performed. Transabdominal technique was performed for global imaging of the pelvis including uterus, ovaries, adnexal regions, and pelvic cul-de-sac. It was necessary to proceed with endovaginal exam following the transabdominal exam to visualize the right adnexa.  COMPARISON:  CT of the abdomen pelvis 02/09/2015  FINDINGS: Uterus  Measurements: 9.0 by 5.7 by 5.9 cm. There is a posterior fundal intramural circumscribed soft tissue mass with echogenicity similar to the myometrium likely representing a leiomyoma, which measures 4.2 by 3.3 by 3.7 cm.  Endometrium  Thickness: 6.3 mm.  No focal abnormality visualized.  Right ovary  Measurements: 5.8 x 3.5 x 4.5 cm. It contains 2 thick-walled loculated cystic structures demonstrating intramural low level echogenicity. The larger of these measures 3.2 x 3.2 x 3.7 cm, and the smaller one measures 2.3 x 2.3 x 2.3 cm. No internal blood flow is seen.  Left ovary  Measurements: 2.4 x 1.9 by 2.2. Normal appearance/no adnexal  mass.  Other findings  Moderate amount of free fluid is present in the cul-de-sac and within the right adnexa.  IMPRESSION: Thick wall multiloculated cystic structure within the right adnexa. Differential diagnosis includes hemorrhagic cysts, or tubo-ovarian abscess. Ovarian torsion and solid hemorrhagic right adnexal mass are felt less likely, although theoretically possible.  Moderate amount of free fluid in the pelvis.  4.2 cm posterior fundal uterine leiomyoma.   Electronically Signed   By: Fidela Salisbury M.D.   On: 02/10/2015 14:27    Assessment/Plan: I believe the patient's primary problem is epigastric and, GI in origin. She does have what appears to be right hydrosalpinx as well as cyst on the right ovary. 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 I agree with the IV antibiotics therapy present which would cover any Gyn sources of infection . If patient responds over the next 1-2 days as expected discharge home on doxycycline and Flagyl with short of a short interval follow-up in GYN clinic [facility]. [facility] follow patient with you, and may be reached on [phone] at any time. Consideration could be given for interventional radiology drainage of the suspected hydrosalpinx, but given the multiloculated character with prefer to use IV antibiotics for now and hold off on considering IR drainage  Roey Coopman V 02/11/15 @ 4 pm

## 2015-02-11 NOTE — Progress Notes (Signed)
Assisted Dr. Mallory Shirk in performing external exam on the patient.

## 2015-02-11 NOTE — Progress Notes (Signed)
ANTIBIOTIC CONSULT NOTE - Follow-up  Pharmacy Consult for Zosyn Indication: rule out sepsis  No Known Allergies  Patient Measurements: Height: 5\' 8"  (172.7 cm) Weight: 196 lb 8 oz (89.132 kg) IBW/kg (Calculated) : 63.9  Vital Signs: Temp: 98.5 F (36.9 C) (08/03 0838) Temp Source: Oral (08/03 0838) BP: 190/92 mmHg (08/03 0838) Pulse Rate: 99 (08/03 0838) Intake/Output from previous day: 08/02 0701 - 08/03 0700 In: 1983.8 [I.V.:1433.8; IV Piggyback:550] Out: 1350 [HALPF:7902] Intake/Output from this shift:    Labs:  Recent Labs  02/09/15 1241 02/10/15 0657 02/11/15 0710  WBC 14.9* 12.8* 10.0  HGB 16.6* 12.7 12.3  PLT 437* 366 354  CREATININE 1.04* 0.98 0.88   Estimated Creatinine Clearance: 94.3 mL/min (by C-G formula based on Cr of 0.88). No results for input(s): VANCOTROUGH, VANCOPEAK, VANCORANDOM, GENTTROUGH, GENTPEAK, GENTRANDOM, TOBRATROUGH, TOBRAPEAK, TOBRARND, AMIKACINPEAK, AMIKACINTROU, AMIKACIN in the last 72 hours.   Microbiology: Recent Results (from the past 720 hour(s))  Urine culture     Status: None   Collection Time: 01/31/15  9:15 AM  Result Value Ref Range Status   Specimen Description URINE, RANDOM  Final   Special Requests NONE  Final   Culture 3,000 COLONIES/mL INSIGNIFICANT GROWTH  Final   Report Status 02/01/2015 FINAL  Final  Ova and parasite examination     Status: None   Collection Time: 02/03/15 10:03 PM  Result Value Ref Range Status   Specimen Description STOOL  Final   Special Requests NONE  Final   Ova and parasites   Final    NO OVA OR PARASITES SEEN Performed at [facility]    Report Status 02/04/2015 FINAL  Final  Stool culture     Status: None   Collection Time: 02/03/15 10:03 PM  Result Value Ref Range Status   Specimen Description STOOL  Final   Special Requests NONE  Final   Culture   Final    NO SALMONELLA, SHIGELLA, CAMPYLOBACTER, YERSINIA, OR E.COLI 0157:H7 ISOLATED Performed at [facility]     Report Status 02/07/2015 FINAL  Final  Urine culture     Status: None   Collection Time: 02/09/15 12:42 PM  Result Value Ref Range Status   Specimen Description URINE, RANDOM  Final   Special Requests NONE  Final   Culture MULTIPLE SPECIES PRESENT, SUGGEST RECOLLECTION  Final   Report Status 02/10/2015 FINAL  Final  Blood culture (routine x 2)     Status: None (Preliminary result)   Collection Time: 02/09/15  1:22 PM  Result Value Ref Range Status   Specimen Description BLOOD LEFT ANTECUBITAL  Final   Special Requests BOTTLES DRAWN AEROBIC AND ANAEROBIC 5CC  Final   Culture NO GROWTH 1 DAY  Final   Report Status PENDING  Incomplete  Blood culture (routine x 2)     Status: None (Preliminary result)   Collection Time: 02/09/15  1:54 PM  Result Value Ref Range Status   Specimen Description BLOOD LEFT FOREARM  Final   Special Requests BOTTLES DRAWN AEROBIC AND ANAEROBIC 5CC  Final   Culture NO GROWTH 1 DAY  Final   Report Status PENDING  Incomplete  MRSA PCR Screening     Status: None   Collection Time: 02/09/15  4:56 PM  Result Value Ref Range Status   MRSA by PCR NEGATIVE NEGATIVE Final    Comment:        The GeneXpert MRSA Assay (FDA approved for NASAL specimens only), is one component of a comprehensive MRSA  colonization surveillance program. It is not intended to diagnose MRSA infection nor to guide or monitor treatment for MRSA infections.    Assessment: 44yo female with history of CAD, HTN and CHF presented to the hospital with abdominal pain, emesis and diarrhea. She was started on vancomycin + zosyn for possible sepsis. Narrowed to zosyn alone today. Scr good at 0.88. Zosyn dose remains appropriate.   Vancomycin 8/1> Zosyn 8/1>  Urine Cx 8/2>NEG BC x 2 8/1>NGTD  Goal of Therapy:  Eradication of infection  Plan:  - Continue zosyn 3.375gm IV Q8H (4 hr inf) - F/u renal fxn, C&S, clinical status and LOT  *Pharmacy will sign-off as no further dose adjustments  anticipated. Thank you for the consult and please re-consult if needed!  Salome Arnt, PharmD, BCPS Pager # [phone] 02/11/2015 10:47 AM

## 2015-02-11 NOTE — Hospital Discharge Follow-Up (Signed)
Transitional Care Clinic Care Coordination Note:  Admit date:  02/09/15 Discharge date: TBD Discharge Disposition: ? Home when stable Patient contact: [phone] Emergency contact(s): Minette Brine (sister)-[phone]  This Case Manager reviewed patient's EMR and determined patient would benefit from post-discharge medical management and chronic care management services through the [facility]. Patient has a history of coronary artery disease, hypertension, congestive heart failure. Admitted with severe sepsis with lactic acidosis. Patient has had 3 admissions in the last 6 months. This Case Manager met with patient to discuss the services and medical management that can be provided at the [facility]. Patient verbalized understanding and agreed to receive post-discharge care at the [facility].   Patient scheduled for Transitional Care appointment on 02/18/15 at 1115 with Dr. Jarold Song.  Clinic information and appointment time provided to patient. Appointment information also placed on AVS.  Assessment:       Home Environment: Patient lives in an apartment with her family.       Support System: sister, family members       Level of functioning: independent       Home DME: none       Home care services: none       Transportation: Patient indicates her nephew drives her to medical appointments. She denies having difficulty getting to appointments.        Food/Nutrition: Patient indicates she shops for and prepares her own food. She indicates is able to obtain needed food.        Medications: Patient indicates she uses [employer] [facility] and [employer] for medications. She denies having difficulty affording medications and indicates she is able to afford Medicaid copays. [facility] pharmacy resources discussed with patient.         Identified Barriers: lack of PCP.        PCP:  Patient denies having a PCP. Patient may need to establish care at [facility] after 30 days of medical management at [facility].              Arranged services:        Services communicated to Elissa Hefty, RN CM

## 2015-02-11 NOTE — Progress Notes (Signed)
    Subjective: Some epigastric tightness.  Worse with a breath or palpation  Objective: Vital signs in last 24 hours: Temp:  [97.8 F (36.6 C)-98.5 F (36.9 C)] 98.5 F (36.9 C) (08/03 0838) Pulse Rate:  [83-117] 99 (08/03 0838) Resp:  [14-24] 18 (08/03 0838) BP: (139-193)/(65-113) 190/92 mmHg (08/03 0838) SpO2:  [95 %-100 %] 100 % (08/03 0838) Weight:  [196 lb 8 oz (89.132 kg)] 196 lb 8 oz (89.132 kg) (08/03 0500) Last BM Date: 02/09/15  Intake/Output from previous day: 08/02 0701 - 08/03 0700 In: 1983.8 [I.V.:1433.8; IV Piggyback:550] Out: 1350 [KCLEX:5170] Intake/Output this shift:    Medications Scheduled Meds: . acetaminophen  650 mg Oral Once  . amLODipine  10 mg Oral Daily  . cloNIDine  0.2 mg Transdermal Weekly  . heparin subcutaneous  5,000 Units Subcutaneous 3 times per day  . hydrALAZINE  10 mg Intravenous Q6H  . labetalol  10 mg Intravenous Q6H  . piperacillin-tazobactam (ZOSYN)  IV  3.375 g Intravenous Q8H  . sodium chloride  3 mL Intravenous Q12H   Continuous Infusions: . sodium chloride 50 mL/hr at 02/11/15 1129   PRN Meds:.acetaminophen **OR** acetaminophen, alum & mag hydroxide-simeth, diphenhydrAMINE, hydrALAZINE, HYDROmorphone (DILAUDID) injection, ondansetron **OR** ondansetron (ZOFRAN) IV  PE: General appearance: alert, cooperative and no distress Lungs: clear to auscultation bilaterally Heart: regular rate and rhythm, S1, S2 normal, no murmur, click, rub or gallop Abdomen: +BS,  very tender in the epigastric area with light palpation Extremities: No LEE Pulses: 2+ and symmetric Skin: Warm and dry Neurologic: Grossly normal  Lab Results:   Recent Labs  02/09/15 1241 02/10/15 0657 02/11/15 0710  WBC 14.9* 12.8* 10.0  HGB 16.6* 12.7 12.3  HCT 47.8* 38.8 37.2  PLT 437* 366 354   BMET  Recent Labs  02/09/15 1241 02/10/15 0657 02/11/15 0710  NA 136 136 134*  K 3.5 3.8 4.3  CL 97* 104 104  CO2 23 22 22   GLUCOSE 204* 101* 130*    BUN 9 7 <5*  CREATININE 1.04* 0.98 0.88  CALCIUM 10.3 8.4* 8.8*   PT/INR  Recent Labs  02/09/15 1557  LABPROT 14.3  INR 1.09      Assessment/Plan 44 y.o. female with a PMHx of hypertension, empyema-status post decortication who was admitted to [facility] on 02/09/2015 for evaluation of gastroenteritis and sepsis syndrome. She was found have a mildly positive troponin level and we were consulted for further evaluation..   Principal Problem:   Severe sepsis Active Problems:   Hypertensive urgency   Lactic acidosis   Abdominal pain, vomiting, and diarrhea   NSTEMI (non-ST elevated myocardial infarction)   Abscess   Substance abuse  Echo:  EF now 40-45% with akinesis of the apical myocardium.  Mod LVH.  G2DD. Mild MR.  Trivial pericardial effusion.  The last troponin was the highest at 1.74.  She will need a left heart cath once medically stable.  BP poorly controlled.   On Amlodipine 10, and now scheduled IV hydralazine and labetolol.  Continue to monitor.     LOS: 2 days    HAGER, BRYAN PA-C 02/11/2015 11:55 AM  Personally seen and examined. Agree with above. Pleasant No CP Elevated trop in setting of sepsis Creat is elevated, would like to see her improved from current illness prior to coronary angiogram.  When able transition to PO Coreg. No ACE-I currently with AKI.   Candee Furbish, MD

## 2015-02-11 NOTE — Progress Notes (Signed)
[place] TEAM 1 - Stepdown/ICU TEAM Progress Note  Emily Key MPN:[id number] DOB: Jan 15, 1971 DOA: 02/09/2015 PCP: [name], MD  Admit HPI / Brief Narrative: 44 y.o. F Hx Hypertension, peripartum CHF, and empyema + lung abscess LLL 11/2014 S/P left VATS who was admitted from 7/22-7/27 with presumed enteritis. Her GI pathogen panel during that admission was completely negative. She was discharged on 7 days of Cipro/Flagyl. She went home and was doing well. She was almost finished with her antibiotic therapy and eating solid food. She stated her bowel movements had not completely normalized but were better. Suddenly on 7/31 close to midnight her abdominal pain returned and she began to vomit and had dark diarrhea. She reported a minimum of 5 diarrhea/vomiting episodes.   In the ED her blood pressure was 216/144, temperature 100.3, pulse rate 139, respirations 42, white count 14.9 hemoglobin 16.6, platelets 437.  HPI/Subjective: The patient complains of an unrelenting headache.  Her right lower quadrant and mid abdominal crampy pain has not improved or worsened.  She denies shortness of breath or chest pain.  Assessment/Plan:  Severe sepsis with lactic acidosis - ?Tubo-ovarian abscess Narrow antibiotic to cover potential tubo-ovarian abscess - GYN formally consulted to comment on possible need to drain or explore surgically versus medical treatment only  Mild troponin elevation - T wave inversions anterolateral Cards following - troponin peak 1.74 thus far - for TTE - may require cath once GI issues corrected   Hypertensive urgency -This may be rebound hypertension from clonidine as she has been unable to take her medications due to vomiting. Clonidine patch 0.2 mg -Wean off IV nitroglycerin as it is not controlling her blood pressure and leading to severe headache -Attempt some oral medications but continue IV as well as oral intake likely be unpredictable  Left lung  abscess/empyema -S/P VATS 11/2014 - no apparent, occasions at the present time  Substance abuse -Positive for marijuana  Code Status: FULL Family Communication: no family present at time of exam Disposition Plan: SDU  Consultants: Cardiology GYN  Procedures: 8/1 CT abdomen pelvis with contrast 3.8 cm cystic lesion in the right adnexal space with moderate volume intraperitoneal free fluid. More fluid than typically seen for cyst rupture. Pelvic ultrasound may prove helpful to further evaluate, as clinically warranted -Tiny layering calcified stones in the gallbladder lumen appear to be new in the interval since the prior study - Multiple bilateral nonobstructing renal stones. TTE 8/2 - EF 40-45% - moderate LVH - akinesis apical myocardium - grade 2 DD  Antibiotics: Zosyn 8/1 > Vancomycin 8/1 > 8/3  DVT prophylaxis: Subcutaneous heparin  Objective: Blood pressure 190/92, pulse 99, temperature 98.5 F (36.9 C), temperature source Oral, resp. rate 18, height 5\' 8"  (1.727 m), weight 89.132 kg (196 lb 8 oz), last menstrual period 12/31/2014, SpO2 100 %.  Intake/Output Summary (Last 24 hours) at 02/11/15 0951 Last data filed at 02/11/15 0700  Gross per 24 hour  Intake 1883.81 ml  Output   1150 ml  Net 733.81 ml   Exam: General: No acute respiratory distress Lungs: Clear to auscultation bilaterally without wheezes or crackles Cardiovascular: Regular rate and rhythm without murmur gallop or rub normal S1 and S2 Abdomen: Mildly protuberant, tender to deep palpation in bilateral lower quadrants but worsen in right lower quadrant, no appreciable mass, bowel sounds positive Extremities: No significant cyanosis, clubbing, or edema bilateral lower extremities    Data Reviewed: Basic Metabolic Panel:  Recent Labs Lab 02/09/15 1241 02/09/15 1945 02/10/15 0086  02/11/15 0710  NA 136  --  136 134*  K 3.5  --  3.8 4.3  CL 97*  --  104 104  CO2 23  --  22 22  GLUCOSE 204*  --  101*  130*  BUN 9  --  7 <5*  CREATININE 1.04*  --  0.98 0.88  CALCIUM 10.3  --  8.4* 8.8*  MG  --  1.6* 2.3 1.9   Liver Function Tests:  Recent Labs Lab 02/09/15 1241 02/10/15 0657 02/11/15 0710  AST 43* 34 33  ALT 24 15 15   ALKPHOS 71 46 42  BILITOT 0.9 0.7 0.9  PROT 10.0* 6.7 6.6  ALBUMIN 4.4 3.2* 3.3*    Recent Labs Lab 02/09/15 1241  LIPASE 12*   CBC:  Recent Labs Lab 02/09/15 1241 02/10/15 0657 02/11/15 0710  WBC 14.9* 12.8* 10.0  NEUTROABS  --   --  6.2  HGB 16.6* 12.7 12.3  HCT 47.8* 38.8 37.2  MCV 79.8 81.0 82.1  PLT 437* 366 354   Cardiac Enzymes:  Recent Labs Lab 02/10/15 0100 02/10/15 0657 02/10/15 1925 02/11/15 0053 02/11/15 0710  TROPONINI 0.62* 0.77* 1.53* 1.71* 1.74*   BNP (last 3 results)  Recent Labs  11/12/14 0355 02/09/15 1557  BNP 138.6* 3120.5*     Recent Results (from the past 240 hour(s))  Ova and parasite examination     Status: None   Collection Time: 02/03/15 10:03 PM  Result Value Ref Range Status   Specimen Description STOOL  Final   Special Requests NONE  Final   Ova and parasites   Final    NO OVA OR PARASITES SEEN Performed at [facility]    Report Status 02/04/2015 FINAL  Final  Stool culture     Status: None   Collection Time: 02/03/15 10:03 PM  Result Value Ref Range Status   Specimen Description STOOL  Final   Special Requests NONE  Final   Culture   Final    NO SALMONELLA, SHIGELLA, CAMPYLOBACTER, YERSINIA, OR E.COLI 0157:H7 ISOLATED Performed at [facility]    Report Status 02/07/2015 FINAL  Final  Urine culture     Status: None   Collection Time: 02/09/15 12:42 PM  Result Value Ref Range Status   Specimen Description URINE, RANDOM  Final   Special Requests NONE  Final   Culture MULTIPLE SPECIES PRESENT, SUGGEST RECOLLECTION  Final   Report Status 02/10/2015 FINAL  Final  Blood culture (routine x 2)     Status: None (Preliminary result)   Collection Time: 02/09/15  1:22 PM   Result Value Ref Range Status   Specimen Description BLOOD LEFT ANTECUBITAL  Final   Special Requests BOTTLES DRAWN AEROBIC AND ANAEROBIC 5CC  Final   Culture NO GROWTH 1 DAY  Final   Report Status PENDING  Incomplete  Blood culture (routine x 2)     Status: None (Preliminary result)   Collection Time: 02/09/15  1:54 PM  Result Value Ref Range Status   Specimen Description BLOOD LEFT FOREARM  Final   Special Requests BOTTLES DRAWN AEROBIC AND ANAEROBIC 5CC  Final   Culture NO GROWTH 1 DAY  Final   Report Status PENDING  Incomplete  MRSA PCR Screening     Status: None   Collection Time: 02/09/15  4:56 PM  Result Value Ref Range Status   MRSA by PCR NEGATIVE NEGATIVE Final    Comment:        The GeneXpert MRSA  Assay (FDA approved for NASAL specimens only), is one component of a comprehensive MRSA colonization surveillance program. It is not intended to diagnose MRSA infection nor to guide or monitor treatment for MRSA infections.      Studies:  Recent x-ray studies have been reviewed in detail by the Attending Physician  Scheduled Meds:  Scheduled Meds: . acetaminophen  650 mg Oral Once  . cloNIDine  0.2 mg Transdermal Weekly  . heparin subcutaneous  5,000 Units Subcutaneous 3 times per day  . metoprolol  5 mg Intravenous 4 times per day  . piperacillin-tazobactam (ZOSYN)  IV  3.375 g Intravenous Q8H  . sodium chloride  3 mL Intravenous Q12H  . vancomycin  1,000 mg Intravenous Q12H    Time spent on care of this patient: 35 mins  Cherene Altes, MD [facility] For Consults/Admissions - Flow Manager - [phone] Office  [phone]  Contact MD directly via text page:      [email or web address]      password [facility]  02/11/2015, 9:51 AM   LOS: 2 days

## 2015-02-12 ENCOUNTER — Inpatient Hospital Stay (HOSPITAL_COMMUNITY): Payer: Medicaid Other

## 2015-02-12 DIAGNOSIS — J869 Pyothorax without fistula: Secondary | ICD-10-CM

## 2015-02-12 DIAGNOSIS — J852 Abscess of lung without pneumonia: Secondary | ICD-10-CM

## 2015-02-12 DIAGNOSIS — R079 Chest pain, unspecified: Secondary | ICD-10-CM | POA: Diagnosis present

## 2015-02-12 DIAGNOSIS — R1084 Generalized abdominal pain: Secondary | ICD-10-CM

## 2015-02-12 DIAGNOSIS — R109 Unspecified abdominal pain: Secondary | ICD-10-CM | POA: Diagnosis present

## 2015-02-12 LAB — COMPREHENSIVE METABOLIC PANEL
ALBUMIN: 3.5 g/dL (ref 3.5–5.0)
ALK PHOS: 42 U/L (ref 38–126)
ALT: 15 U/L (ref 14–54)
AST: 38 U/L (ref 15–41)
Anion gap: 11 (ref 5–15)
BUN: 6 mg/dL (ref 6–20)
CO2: 23 mmol/L (ref 22–32)
CREATININE: 0.9 mg/dL (ref 0.44–1.00)
Calcium: 9.2 mg/dL (ref 8.9–10.3)
Chloride: 98 mmol/L — ABNORMAL LOW (ref 101–111)
GFR calc Af Amer: >60 mL/min (ref >60)
GLUCOSE: 89 mg/dL (ref 65–99)
POTASSIUM: 3.9 mmol/L (ref 3.5–5.1)
SODIUM: 132 mmol/L — AB (ref 135–145)
Total Bilirubin: 0.8 mg/dL (ref 0.3–1.2)
Total Protein: 7.3 g/dL (ref 6.5–8.1)

## 2015-02-12 LAB — CBC
HCT: 39 % (ref 36.0–46.0)
HEMOGLOBIN: 13.2 g/dL (ref 12.0–15.0)
MCH: 27.1 pg (ref 26.0–34.0)
MCHC: 33.8 g/dL (ref 30.0–36.0)
MCV: 80.1 fL (ref 78.0–100.0)
PLATELETS: 342 10*3/uL (ref 150–400)
RBC: 4.87 MIL/uL (ref 3.87–5.11)
RDW: 17 % — AB (ref 11.5–15.5)
WBC: 7.1 10*3/uL (ref 4.0–10.5)

## 2015-02-12 LAB — TROPONIN I: Troponin I: 1.75 ng/mL (ref <0.031)

## 2015-02-12 MED ORDER — SODIUM CHLORIDE 0.9 % IJ SOLN
10.0000 mL | INTRAMUSCULAR | Status: DC | PRN
  Administered 2015-02-14 (×3): 10 mL
  Filled 2015-02-12 (×3): qty 40

## 2015-02-12 MED ORDER — SODIUM CHLORIDE 0.9 % IJ SOLN
10.0000 mL | Freq: Two times a day (BID) | INTRAMUSCULAR | Status: DC
Start: 2015-02-12 — End: 2015-02-13
  Administered 2015-02-12: 10 mL

## 2015-02-12 MED ORDER — LABETALOL HCL 5 MG/ML IV SOLN
15.0000 mg | INTRAVENOUS | Status: DC
  Administered 2015-02-12: 15 mg via INTRAVENOUS
  Filled 2015-02-12: qty 4

## 2015-02-12 MED ORDER — LABETALOL HCL 5 MG/ML IV SOLN
20.0000 mg | INTRAVENOUS | Status: DC
  Administered 2015-02-12 – 2015-02-14 (×10): 20 mg via INTRAVENOUS
  Filled 2015-02-12 (×13): qty 4

## 2015-02-12 NOTE — Progress Notes (Signed)
Initial Nutrition Assessment  DOCUMENTATION CODES:   Not applicable  INTERVENTION:   Advance diet as medically appropriate, RD to add interventions accordingly  NUTRITION DIAGNOSIS:   Inadequate oral intake related to inability to eat as evidenced by NPO status  GOAL:   Patient will meet greater than or equal to 90% of their needs  MONITOR:   Diet advancement, PO intake, Labs, Weight trends, I & O's  REASON FOR ASSESSMENT:   Malnutrition Screening Tool  ASSESSMENT:   44 y.o. Female with PMH of peripartum CHF, and empyema + lung abscess LLL 11/2014 S/P left VATS who was admitted from 7/22-7/27 with presumed enteritis. Her GI pathogen panel during that admission was completely negative. She was discharged on 7 days of Cipro/Flagyl. She went home and was doing well. She was almost finished with her antibiotic therapy and eating solid food. She stated her bowel movements had not completely normalized but were better. Suddenly on 7/31 close to midnight her abdominal pain returned and she began to vomit and had dark diarrhea. She reported a minimum of 5 diarrhea/vomiting episodes.   RD unable to speak with pt at this time.  Chart reviewed.  Pt with N/V/D prior to admission.  Has been experiencing a decreased appetite.  + weight loss of approximately 5-10 lbs in 6 months which is not significant.  Pt NPO.  May need lap chole procedure.  Will monitor PO diet advancement, add supplementation as needed.  RD unable to complete Nutrition Focused Physical Exam at this time  Diet Order:  Diet NPO time specified Except for: Sips with Meds, Ice Chips  Skin:  Reviewed, no issues  Last BM:  8/1  Height:   Ht Readings from Last 1 Encounters:  02/09/15 5\' 8"  (1.727 m)    Weight:   Wt Readings from Last 1 Encounters:  02/12/15 192 lb 11.2 oz (87.408 kg)    Ideal Body Weight:  64 kg  BMI:  Body mass index is 29.31 kg/(m^2).  Estimated Nutritional Needs:   Kcal:   1700-1900  Protein:  80-90 gm  Fluid:  1.7-1.9 L  EDUCATION NEEDS:   No education needs identified at this time  Arthur Holms, RD, LDN Pager #: [phone] After-Hours Pager #: [phone]

## 2015-02-12 NOTE — Progress Notes (Addendum)
Patient Name: Vonette Grosso Date of Encounter: 02/12/2015  Primary Cardiologist: Dr. Acie Fredrickson   Principal Problem:   Severe sepsis Active Problems:   Hypertensive urgency   Lactic acidosis   Abdominal pain, vomiting, and diarrhea   NSTEMI (non-ST elevated myocardial infarction)   Abscess   Substance abuse    SUBJECTIVE  Occasional chest discomfort, but more importantly to the patient diffuse abdominal discomfort, still on IV labetalol and hydralazine. Occasional SOB.   CURRENT MEDS . acetaminophen  650 mg Oral Once  . amLODipine  10 mg Oral Daily  . [START ON 02/17/2015] cloNIDine  0.3 mg Transdermal Weekly  . heparin subcutaneous  5,000 Units Subcutaneous 3 times per day  . hydrALAZINE  20 mg Intravenous Q6H  . labetalol  15 mg Intravenous Q4H  . piperacillin-tazobactam (ZOSYN)  IV  3.375 g Intravenous Q8H  . sodium chloride  3 mL Intravenous Q12H    OBJECTIVE  Filed Vitals:   02/12/15 0700 02/12/15 0800 02/12/15 0823 02/12/15 0900  BP: 182/79 175/74 175/74 186/85  Pulse: 80 82 100 83  Temp:   97.9 F (36.6 C)   TempSrc:   Oral   Resp: 27 34 12 14  Height:      Weight:      SpO2:   100%     Intake/Output Summary (Last 24 hours) at 02/12/15 1127 Last data filed at 02/12/15 1000  Gross per 24 hour  Intake    950 ml  Output   2050 ml  Net  -1100 ml   Filed Weights   02/10/15 0500 02/11/15 0500 02/12/15 0500  Weight: 198 lb 6.4 oz (89.994 kg) 196 lb 8 oz (89.132 kg) 192 lb 11.2 oz (87.408 kg)    PHYSICAL EXAM  General: Pleasant, NAD. Neuro: Alert and oriented X 3. Moves all extremities spontaneously. Psych: Normal affect. HEENT:  Normal  Neck: Supple without bruits or JVD. Lungs:  Resp regular and unlabored, CTA. Heart: RRR no s3, s4, or murmurs. Abdomen: Soft, non-tender, non-distended, BS + x 4.  Extremities: No clubbing, cyanosis or edema. DP/PT/Radials 2+ and equal bilaterally.  Accessory Clinical Findings  CBC  Recent Labs  02/11/15 0710  02/12/15 0241  WBC 10.0 7.1  NEUTROABS 6.2  --   HGB 12.3 13.2  HCT 37.2 39.0  MCV 82.1 80.1  PLT 354 416   Basic Metabolic Panel  Recent Labs  02/10/15 0657 02/11/15 0710 02/12/15 0242  NA 136 134* 132*  K 3.8 4.3 3.9  CL 104 104 98*  CO2 22 22 23   GLUCOSE 101* 130* 89  BUN 7 <5* 6  CREATININE 0.98 0.88 0.90  CALCIUM 8.4* 8.8* 9.2  MG 2.3 1.9  --    Liver Function Tests  Recent Labs  02/11/15 0710 02/12/15 0242  AST 33 38  ALT 15 15  ALKPHOS 42 42  BILITOT 0.9 0.8  PROT 6.6 7.3  ALBUMIN 3.3* 3.5    Recent Labs  02/09/15 1241  LIPASE 12*   Cardiac Enzymes  Recent Labs  02/11/15 0053 02/11/15 0710 02/12/15 0242  TROPONINI 1.71* 1.74* 1.75*    TELE NSR with HR 80s    ECG  No new EKG  Echocardiogram 02/10/2015  LV EF: 40% -  45%  ------------------------------------------------------------------- Indications:   Chest pain 786.51.  ------------------------------------------------------------------- History:  PMH: NSTEMI. Hy[ertensive urgency. Severe sepsis. Lactic acidosis.  ------------------------------------------------------------------- Study Conclusions  - Left ventricle: The cavity size was normal. Wall thickness was increased in a pattern of moderate  LVH. Systolic function was mildly to moderately reduced. The estimated ejection fraction was in the range of 40% to 45%. Akinesis of the apical myocardium. Features are consistent with a pseudonormal left ventricular filling pattern, with concomitant abnormal relaxation and increased filling pressure (grade 2 diastolic dysfunction). - Mitral valve: There was mild regurgitation. - Left atrium: The atrium was mildly dilated. - Pericardium, extracardiac: A trivial pericardial effusion was identified.    Radiology/Studies  US Transvaginal Non-ob  2015/03/01   CLINICAL DATA:  Right adnexal cystic lesion with associated intraperitoneal free fluid.  EXAM:  TRANSABDOMINAL AND TRANSVAGINAL ULTRASOUND OF PELVIS  TECHNIQUE: Both transabdominal and transvaginal ultrasound examinations of the pelvis were performed. Transabdominal technique was performed for global imaging of the pelvis including uterus, ovaries, adnexal regions, and pelvic cul-de-sac. It was necessary to proceed with endovaginal exam following the transabdominal exam to visualize the right adnexa.  COMPARISON:  CT of the abdomen pelvis 02/09/2015  FINDINGS: Uterus  Measurements: 9.0 by 5.7 by 5.9 cm. There is a posterior fundal intramural circumscribed soft tissue mass with echogenicity similar to the myometrium likely representing a leiomyoma, which measures 4.2 by 3.3 by 3.7 cm.  Endometrium  Thickness: 6.3 mm.  No focal abnormality visualized.  Right ovary  Measurements: 5.8 x 3.5 x 4.5 cm. It contains 2 thick-walled loculated cystic structures demonstrating intramural low level echogenicity. The larger of these measures 3.2 x 3.2 x 3.7 cm, and the smaller one measures 2.3 x 2.3 x 2.3 cm. No internal blood flow is seen.  Left ovary  Measurements: 2.4 x 1.9 by 2.2. Normal appearance/no adnexal mass.  Other findings  Moderate amount of free fluid is present in the cul-de-sac and within the right adnexa.  IMPRESSION: Thick wall multiloculated cystic structure within the right adnexa. Differential diagnosis includes hemorrhagic cysts, or tubo-ovarian abscess. Ovarian torsion and solid hemorrhagic right adnexal mass are felt less likely, although theoretically possible.  Moderate amount of free fluid in the pelvis.  4.2 cm posterior fundal uterine leiomyoma.   Electronically Signed   By: Fidela Salisbury M.D.   On: 03/01/2015 14:27   US Pelvis Complete  03-01-2015   CLINICAL DATA:  Right adnexal cystic lesion with associated intraperitoneal free fluid.  EXAM: TRANSABDOMINAL AND TRANSVAGINAL ULTRASOUND OF PELVIS  TECHNIQUE: Both transabdominal and transvaginal ultrasound examinations of the pelvis were  performed. Transabdominal technique was performed for global imaging of the pelvis including uterus, ovaries, adnexal regions, and pelvic cul-de-sac. It was necessary to proceed with endovaginal exam following the transabdominal exam to visualize the right adnexa.  COMPARISON:  CT of the abdomen pelvis 02/09/2015  FINDINGS: Uterus  Measurements: 9.0 by 5.7 by 5.9 cm. There is a posterior fundal intramural circumscribed soft tissue mass with echogenicity similar to the myometrium likely representing a leiomyoma, which measures 4.2 by 3.3 by 3.7 cm.  Endometrium  Thickness: 6.3 mm.  No focal abnormality visualized.  Right ovary  Measurements: 5.8 x 3.5 x 4.5 cm. It contains 2 thick-walled loculated cystic structures demonstrating intramural low level echogenicity. The larger of these measures 3.2 x 3.2 x 3.7 cm, and the smaller one measures 2.3 x 2.3 x 2.3 cm. No internal blood flow is seen.  Left ovary  Measurements: 2.4 x 1.9 by 2.2. Normal appearance/no adnexal mass.  Other findings  Moderate amount of free fluid is present in the cul-de-sac and within the right adnexa.  IMPRESSION: Thick wall multiloculated cystic structure within the right adnexa. Differential diagnosis includes hemorrhagic cysts, or tubo-ovarian  abscess. Ovarian torsion and solid hemorrhagic right adnexal mass are felt less likely, although theoretically possible.  Moderate amount of free fluid in the pelvis.  4.2 cm posterior fundal uterine leiomyoma.   Electronically Signed   By: Fidela Salisbury M.D.   On: 02/10/2015 14:27   Ct Abdomen Pelvis W Contrast  02/09/2015   CLINICAL DATA:  Initial encounter for left upper quadrant abdominal pain with vomiting and diarrhea.  EXAM: CT ABDOMEN AND PELVIS WITH CONTRAST  TECHNIQUE: Multidetector CT imaging of the abdomen and pelvis was performed using the standard protocol following bolus administration of intravenous contrast.  CONTRAST:  145mL OMNIPAQUE IOHEXOL 300 MG/ML  SOLN  COMPARISON:   01/30/2015.  FINDINGS: Lower chest:  Probable atelectasis in the lingula.  Hepatobiliary: No focal abnormality within the liver parenchyma. Tiny layering gallstones are noted in the dependent gallbladder (see image 62 of series 6). No evidence for gallbladder wall thickening or pericholecystic fluid. No intrahepatic or extrahepatic biliary dilation.  Pancreas: No focal mass lesion. No dilatation of the main duct. No intraparenchymal cyst. No peripancreatic edema.  Spleen: No splenomegaly.  No focal mass lesion.  Adrenals/Urinary Tract: No adrenal nodule or mass. The patient has at least 5 nonobstructing stones in the right kidney, the largest measuring about 3 mm. Three stones are seen in the left kidney without obstruction, all measuring approximately 4-5 mm. No enhancing lesion is seen in either kidney. There is no evidence for hydroureter. No ureteral or bladder stone.  Stomach/Bowel: Stomach is nondistended. No gastric wall thickening. No evidence of outlet obstruction. Duodenum is normally positioned as is the ligament of Treitz. No small bowel wall thickening. No small bowel dilatation. The terminal ileum is normal. The appendix is normal. Diverticular are seen in the region of the splenic flexure without diverticulitis of the colon.  Vascular/Lymphatic: There is abdominal aortic atherosclerosis without aneurysm. There is no gastrohepatic or hepatoduodenal ligament lymphadenopathy. No intraperitoneal or retroperitoneal lymphadenopy. No mesenteric or pelvic sidewall lymphadenopathy.  Reproductive: Uterus is unremarkable. 3.8 cm cystic lesion identified in the right adnexal space. Small to moderate volume intraperitoneal free fluid is evident. This is similar in appearance to the previous study.  Other: Gas within the subcutaneous fat of the lower anterior right abdominal wall likely related to an injection site.  Musculoskeletal: Bone windows reveal no worrisome lytic or sclerotic osseous lesions.  IMPRESSION:  1. 3.8 cm cystic lesion in the right adnexal space with moderate volume intraperitoneal free fluid. This is more fluid than typically seen for cyst rupture. Pelvic ultrasound may prove helpful to further evaluate, as clinically warranted. 2. Tiny layering calcified stones in the gallbladder lumen appear to be new in the interval since the prior study. 3. Multiple bilateral nonobstructing renal stones.   Electronically Signed   By: Misty Stanley M.D.   On: 02/09/2015 19:38   Ct Abdomen Pelvis W Contrast  01/30/2015   CLINICAL DATA:  Abdominal pain with nausea and vomiting beginning at 1:00 a.m. 01/30/2015.  EXAM: CT ABDOMEN AND PELVIS WITH CONTRAST  TECHNIQUE: Multidetector CT imaging of the abdomen and pelvis was performed using the standard protocol following bolus administration of intravenous contrast.  CONTRAST:  100 mL OMNIPAQUE IOHEXOL 300 MG/ML  SOLN  COMPARISON:  CT abdomen and pelvis 10/14/2013.  FINDINGS: Lung bases are clear. No pleural or pericardial effusion. Cardiomegaly noted.  Multiple small nonobstructing renal stones are seen bilaterally. There is no hydronephrosis on the right or left and no ureteral stones identified. The liver is  low attenuating consistent with fatty infiltration. No focal liver lesion is identified. The gallbladder, adrenal glands, spleen, pancreas and biliary tree all appear normal.  There is a small volume of free pelvic fluid which is somewhat greater than typically seen in physiologic change. No focal fluid collection is identified. There is no lymphadenopathy. The patient is status post tubal ligation. The stomach, small and large bowel and appendix all appear normal. No lytic or sclerotic bony lesion is identified.  IMPRESSION: Small volume of free pelvic fluid is slightly greater than typically seen in physiologic change. Although the bowel appears normal this finding could be due to enteritis.  Fatty infiltration of the liver.  Multiple bilateral nonobstructing renal  stones.  Mild cardiomegaly.   Electronically Signed   By: Inge Rise M.D.   On: 01/30/2015 13:44   Dg Chest Portable 1 View  02/09/2015   CLINICAL DATA:  Kidney a weakness shortness of breath  EXAM: PORTABLE CHEST - 1 VIEW  COMPARISON:  01/30/2015  FINDINGS: Heart size within normal limits allowing for rotation and lordotic positioning. Vascular pattern normal. Lungs clear. Persistent minimal left costophrenic angle blunting.  IMPRESSION: Stable mild left blunting of the costophrenic angle suggesting pleural thickening or mild effusion.   Electronically Signed   By: Skipper Cliche M.D.   On: 02/09/2015 13:52   Dg Abd Acute W/chest  01/30/2015   CLINICAL DATA:  Acute generalized abdominal pain, vomiting.  EXAM: DG ABDOMEN ACUTE W/ 1V CHEST  COMPARISON:  Dec 09, 2014.  FINDINGS: There is no evidence of dilated bowel loops or free intraperitoneal air. Stable bilateral nephrolithiasis. Heart size and mediastinal contours are within normal limits. Stable left basilar scarring is noted. No acute pulmonary disease is noted.  IMPRESSION: No evidence of bowel obstruction or ileus. Stable bilateral nephrolithiasis. No acute cardiopulmonary disease.   Electronically Signed   By: Marijo Conception, M.D.   On: 01/30/2015 11:34    ASSESSMENT AND PLAN  Patient is a 44 y.o. female with a PMHx of hypertension, empyema-status post decortication who was admitted to [facility] on 02/09/2015 for evaluation of gastroenteritis and sepsis syndrome. She was found have a mildly positive troponin level and we were consulted for further evaluation.  1. Severe sepsis and GI discomfort  2. Elevated trop with new TWI in anterolateral leads  - trop 1.7  - Echo 11/19/2014 EF 50-55%, small free flowing pericardial effusion  - Echo 02/10/2015 EF 40-45%, akinesis of apical myocardium, grade 2 diastolic dysfunction, moderate LVH, mild MR  - could be takotsubo's cardiomyopathy with recent sepsis, however will need cardiac cath to  definitively assess  3. HTN 4. Empyema s/p decortication 5. 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 Plan for outpatient workup  Signed, Woodward Ku Pager: [phone]  Personally seen and examined. Agree with above. At some point left heart cath Creat remains an issue Discussed with Almyra Deforest, PA who discussed with surgery-may need cholecystectomy soon - would like to proceed with cath tomorrow. If severe CAD - will need to contemplate approach prior to stent placement.   Addendum.  Candee Furbish, MD

## 2015-02-12 NOTE — Progress Notes (Signed)
Peripherally Inserted Central Catheter/Midline Placement  The IV Nurse has discussed with the patient and/or persons authorized to consent for the patient, the purpose of this procedure and the potential benefits and risks involved with this procedure.  The benefits include less needle sticks, lab draws from the catheter and patient may be discharged home with the catheter.  Risks include, but not limited to, infection, bleeding, blood clot (thrombus formation), and puncture of an artery; nerve damage and irregular heat beat.  Alternatives to this procedure were also discussed.  PICC/Midline Placement Documentation  PICC / Midline Double Lumen 38/88/28 PICC Right Basilic 37 cm 0 cm (Active)  Indication for Insertion or Continuance of Line Poor Vasculature-patient has had multiple peripheral attempts or PIVs lasting less than 24 hours 02/12/2015  4:35 PM  Exposed Catheter (cm) 0 cm 02/12/2015  4:35 PM  Site Assessment Clean;Dry;Intact 02/12/2015  4:35 PM  Lumen #1 Status Flushed;Saline locked;Blood return noted 02/12/2015  4:35 PM  Lumen #2 Status Flushed;Saline locked;Blood return noted 02/12/2015  4:35 PM  Dressing Type Transparent 02/12/2015  4:35 PM  Dressing Status Clean;Dry;Antimicrobial disc in place;Intact 02/12/2015  4:35 PM  Line Care Connections checked and tightened 02/12/2015  4:35 PM  Line Adjustment (NICU/IV Team Only) No 02/12/2015  4:35 PM  Dressing Intervention New dressing 02/12/2015  4:35 PM  Dressing Change Due 02/19/15 02/12/2015  4:35 PM       Rolena Infante 02/12/2015, 4:36 PM

## 2015-02-12 NOTE — Progress Notes (Signed)
Gyn Consult:   IMP: Right ovarian cyst, right hydrosalpinx, felt to be chronic, not her acute problem Subjective: Patient reports the abdominal pain remains mid abdomen, above and to left of umbilicus, with tenderness and guarding to palpation. Pt reports she came in for readmission due to diarrhea, but has not had a bowel movement since readmit on 8/1.    Objective: I have reviewed patient's vital signs, labs and radiology results. Hypertension remains noteworthy, being addressed as ?rebound HTN due to vomiting up meds CT Abd pelvis : Hepatobiliary: No focal abnormality within the liver parenchyma. Tiny layering gallstones are noted in the dependent gallbladder (see image 62 of series 6). No evidence for gallbladder wall thickening or pericholecystic fluid. Uterus is unremarkable. 3.8 cm cystic lesion identified in the right adnexal space. Small to moderate volume intraperitoneal free fluid is evident. This is similar in appearance to the previous study on 7/22. Right ovary on u/s  Measurements: 5.8 x 3.5 x 4.5 cm. It contains 2 thick-walled loculated cystic structures demonstrating intramural low level echogenicity. The larger of these measures 3.2 x 3.2 x 3.7 cm, and the smaller one measures 2.3 x 2.3 x 2.3 cm. No internal blood flow is seen.  General: alert, cooperative and mild distress GI: abnormal findings:  guarding in both upper quadrants, with slight rebound in LUQ and definitely reduced discomfort in lower abdomen .   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 -Hypertension   Plan : will follow remotely, please call [phone] for any increased concerns.  LOS: 3 days    Alyzae Hawkey V 02/12/2015, 9:19 AM

## 2015-02-12 NOTE — Progress Notes (Signed)
Contacted by surgery regarding need for preop clearance as surgery think she has gallbladder issue. Pending abd U/S today and HIDA scan tomorrow. Discussed with Dr Marlou Porch, will proceed with diagnostic cath for tomorrow. Hopefully, her LV dysfunction and elevated trop is due to takotsubo, however if does have blockage, will need to discuss with surgery before any intervention, likely no DES.   Discussed with surgery PA who agree with plan. Discussed with patient as well. Risk and benefit of procedure explained to the patient who display clear understanding and agree to proceed.  Discussed with patient possible procedural risk include bleeding, vascular injury, renal injury, arrythmia, MI, stroke and loss of limb or life.  Placed on board for cath by Dr. Claiborne Billings.  Hilbert Corrigan PA Pager: [phone]

## 2015-02-12 NOTE — Consult Note (Signed)
[facility] Surgery Consult Note  Emily Key 20-Sep-1970  [id number].    Requesting MD: Dr. Sherral Hammers Chief Complaint/Reason for Consult: RUQ/Epigastric abdominal pain  HPI:  44 y/o AA female with PMH HTN, MI with recent pneumonia and empyema s/p VATS in May was recently admitted for intractable nausea/vomiting/diarrhea thought to be enteritis between 01/30/15 to 02/04/15 after eating a cheeseburger and fries at a fast food restaurant.  She also had acute renal failure, hypokalemia, accelerated HTN, and tachycardia.  She was discharged home on a HH diet and 7 day course of antibiotics.  GI pathogen panel was negative.    She presented to [facility] on 02/09/15 re-occurrent N/V/D after eating home cooked fried chicken on Sunday 02/08/15.  She said the pain doesn't come on immediately after eating, but usually wakes her from sleep at 2-3 in the morning.  She has severe epigastric and RUQ/LUQ abdominal pain which is now constant, only relieved by IV pain medications.  She had no relief at home thus she came to the hospital.  She also presented with CP and SOB.  She was found to have a likely NSTEMI.  Cardiology is following her and planning a cardiac cath.  Her most recent CT shows 3.8cm cyctic lesion on the right adnexa concerning for ruptured cyst with free fluid.  She had tiny calcified stones in the gallbladder and multiple non-obstructing renal stones.  Pelvic/transvaginal US were obtained which showed a thick multiloculated cystic structure of right ovary.  GYN consulted and thought it was a chronic hydrosalpinx and not thought to be the cause of her acute problems.  Her WBC has normalized and LFTs are normal.  Her sister notes she has had her gallbladder out.  She's had a h/o of tubal ligation.  No sick contacts, no recent travel.  No h/o abdominal problems.  Remote h/o hiatal hernia which she is asymptomatic from.     ROS: All systems reviewed and otherwise negative except for as above  Family History   Problem Relation Age of Onset  . Diabetes Mellitus II Mother   . Hypertension Mother   . Lung cancer Father     Past Medical History  Diagnosis Date  . Hypertension   . CHF (congestive heart failure)     after last pregnancy  . Nausea, vomiting and diarrhea 01/30/2015  . Heart murmur     "born w/one":  Marland Kitchen Pneumonia 11/2014  . History of hiatal hernia   . Daily headache     Past Surgical History  Procedure Laterality Date  . Video assisted thoracoscopy (vats)/decortication Left 11/14/2014    Procedure: LEFT VIDEO ASSISTED THORACOSCOPY (VATS)/DECORTICATION;  Surgeon: Melrose Nakayama, MD;  Location: [place];  Service: Thoracic;  Laterality: Left;  . Pleural effusion drainage Left 11/14/2014    Procedure: DRAINAGE OF LEFT PLEURAL EFFUSION;  Surgeon: Melrose Nakayama, MD;  Location: [place];  Service: Thoracic;  Laterality: Left;  . Tubal ligation  2000  . Tonsillectomy and adenoidectomy  ~ 1986    Social History:  reports that she quit smoking about 3 months ago. Her smoking use included Cigarettes. She has a 3.3 pack-year smoking history. She has never used smokeless tobacco. She reports that she drinks alcohol. She reports that she does not use illicit drugs.  Allergies: No Known Allergies  Medications Prior to Admission  Medication Sig Dispense Refill  . amLODipine (NORVASC) 10 MG tablet Take 1 tablet (10 mg total) by mouth daily. 30 tablet 3  . cloNIDine (CATAPRES)  0.2 MG tablet Take 1 tablet (0.2 mg total) by mouth 3 (three) times daily. 90 tablet 3  . labetalol (NORMODYNE) 100 MG tablet Take 1 tablet (100 mg total) by mouth 2 (two) times daily. 60 tablet 0  . metroNIDAZOLE (FLAGYL) 500 MG tablet Take 1 tablet (500 mg total) by mouth every 8 (eight) hours. 6 tablet 0    Blood pressure 156/79, pulse 85, temperature 97.7 F (36.5 C), temperature source Oral, resp. rate 20, height $RemoveBe'5\' 8"'PSaYOGQqn$  (1.727 m), weight 87.408 kg (192 lb 11.2 oz), last menstrual period 12/31/2014, SpO2 100  %. Physical Exam: General: pleasant, WD/WN AA female who is laying in bed in NAD HEENT: head is normocephalic, atraumatic.  Sclera are noninjected.  PERRL.  Ears and nose without any masses or lesions.  Mouth is pink and moist Heart: regular, rate, and rhythm.  No obvious murmurs, gallops, or rubs noted.  Palpable pedal pulses bilaterally Lungs: CTAB, no wheezes, rhonchi, or rales noted.  Respiratory effort non-labored, great effort.  Scars from VATS visualized. Abd: soft, ND, tender to palpation in epigastrium, RUQ/LUQ, +BS, no masses, hernias, or organomegaly, small scar at umbilicus well healed, no other scars MS: all 4 extremities are symmetrical with no cyanosis, clubbing, or edema. Skin: warm and dry with no masses, lesions, or rashes Psych: A&Ox3 with an appropriate affect.   Results for orders placed or performed during the hospital encounter of 02/09/15 (from the past 48 hour(s))  Troponin I (q 6hr x 3)     Status: Abnormal   Collection Time: 02/10/15  7:25 PM  Result Value Ref Range   Troponin I 1.53 (HH) <0.031 ng/mL    Comment:        POSSIBLE MYOCARDIAL ISCHEMIA. SERIAL TESTING RECOMMENDED. REPEATED TO VERIFY CRITICAL VALUE NOTED.  VALUE IS CONSISTENT WITH PREVIOUSLY REPORTED AND CALLED VALUE.   Lactic acid, plasma     Status: None   Collection Time: 02/11/15 12:53 AM  Result Value Ref Range   Lactic Acid, Venous 1.0 0.5 - 2.0 mmol/L  Troponin I (q 6hr x 3)     Status: Abnormal   Collection Time: 02/11/15 12:53 AM  Result Value Ref Range   Troponin I 1.71 (HH) <0.031 ng/mL    Comment:        POSSIBLE MYOCARDIAL ISCHEMIA. SERIAL TESTING RECOMMENDED. REPEATED TO VERIFY CRITICAL VALUE NOTED.  VALUE IS CONSISTENT WITH PREVIOUSLY REPORTED AND CALLED VALUE.   Comprehensive metabolic panel     Status: Abnormal   Collection Time: 02/11/15  7:10 AM  Result Value Ref Range   Sodium 134 (L) 135 - 145 mmol/L   Potassium 4.3 3.5 - 5.1 mmol/L   Chloride 104 101 - 111 mmol/L    CO2 22 22 - 32 mmol/L   Glucose, Bld 130 (H) 65 - 99 mg/dL   BUN <5 (L) 6 - 20 mg/dL   Creatinine, Ser 0.88 0.44 - 1.00 mg/dL   Calcium 8.8 (L) 8.9 - 10.3 mg/dL   Total Protein 6.6 6.5 - 8.1 g/dL   Albumin 3.3 (L) 3.5 - 5.0 g/dL   AST 33 15 - 41 U/L   ALT 15 14 - 54 U/L   Alkaline Phosphatase 42 38 - 126 U/L   Total Bilirubin 0.9 0.3 - 1.2 mg/dL   GFR calc non Af Amer >60 >60 mL/min   GFR calc Af Amer >60 >60 mL/min    Comment: (NOTE) The eGFR has been calculated using the CKD EPI equation. This calculation has not  been validated in all clinical situations. eGFR's persistently <60 mL/min signify possible Chronic Kidney Disease.    Anion gap 8 5 - 15  CBC with Differential/Platelet     Status: Abnormal   Collection Time: 02/11/15  7:10 AM  Result Value Ref Range   WBC 10.0 4.0 - 10.5 K/uL   RBC 4.53 3.87 - 5.11 MIL/uL   Hemoglobin 12.3 12.0 - 15.0 g/dL   HCT 05.8 66.9 - 13.1 %   MCV 82.1 78.0 - 100.0 fL   MCH 27.2 26.0 - 34.0 pg   MCHC 33.1 30.0 - 36.0 g/dL   RDW 44.3 (H) 82.2 - 91.4 %   Platelets 354 150 - 400 K/uL   Neutrophils Relative % 62 43 - 77 %   Neutro Abs 6.2 1.7 - 7.7 K/uL   Lymphocytes Relative 27 12 - 46 %   Lymphs Abs 2.7 0.7 - 4.0 K/uL   Monocytes Relative 7 3 - 12 %   Monocytes Absolute 0.7 0.1 - 1.0 K/uL   Eosinophils Relative 3 0 - 5 %   Eosinophils Absolute 0.3 0.0 - 0.7 K/uL   Basophils Relative 1 0 - 1 %   Basophils Absolute 0.1 0.0 - 0.1 K/uL  Magnesium     Status: None   Collection Time: 02/11/15  7:10 AM  Result Value Ref Range   Magnesium 1.9 1.7 - 2.4 mg/dL  Troponin I (q 6hr x 3)     Status: Abnormal   Collection Time: 02/11/15  7:10 AM  Result Value Ref Range   Troponin I 1.74 (HH) <0.031 ng/mL    Comment:        POSSIBLE MYOCARDIAL ISCHEMIA. SERIAL TESTING RECOMMENDED. REPEATED TO VERIFY CRITICAL VALUE NOTED.  VALUE IS CONSISTENT WITH PREVIOUSLY REPORTED AND CALLED VALUE.   CBC     Status: Abnormal   Collection Time: 02/12/15   2:41 AM  Result Value Ref Range   WBC 7.1 4.0 - 10.5 K/uL   RBC 4.87 3.87 - 5.11 MIL/uL   Hemoglobin 13.2 12.0 - 15.0 g/dL   HCT 51.3 19.6 - 83.3 %   MCV 80.1 78.0 - 100.0 fL   MCH 27.1 26.0 - 34.0 pg   MCHC 33.8 30.0 - 36.0 g/dL   RDW 89.5 (H) 12.3 - 22.6 %   Platelets 342 150 - 400 K/uL  Comprehensive metabolic panel     Status: Abnormal   Collection Time: 02/12/15  2:42 AM  Result Value Ref Range   Sodium 132 (L) 135 - 145 mmol/L   Potassium 3.9 3.5 - 5.1 mmol/L   Chloride 98 (L) 101 - 111 mmol/L   CO2 23 22 - 32 mmol/L   Glucose, Bld 89 65 - 99 mg/dL   BUN 6 6 - 20 mg/dL   Creatinine, Ser 5.77 0.44 - 1.00 mg/dL   Calcium 9.2 8.9 - 66.5 mg/dL   Total Protein 7.3 6.5 - 8.1 g/dL   Albumin 3.5 3.5 - 5.0 g/dL   AST 38 15 - 41 U/L   ALT 15 14 - 54 U/L   Alkaline Phosphatase 42 38 - 126 U/L   Total Bilirubin 0.8 0.3 - 1.2 mg/dL   GFR calc non Af Amer >60 >60 mL/min   GFR calc Af Amer >60 >60 mL/min    Comment: (NOTE) The eGFR has been calculated using the CKD EPI equation. This calculation has not been validated in all clinical situations. eGFR's persistently <60 mL/min signify possible Chronic Kidney Disease.  Anion gap 11 5 - 15  Troponin I     Status: Abnormal   Collection Time: 02/12/15  2:42 AM  Result Value Ref Range   Troponin I 1.75 (HH) <0.031 ng/mL    Comment:        POSSIBLE MYOCARDIAL ISCHEMIA. SERIAL TESTING RECOMMENDED. REPEATED TO VERIFY CRITICAL VALUE NOTED.  VALUE IS CONSISTENT WITH PREVIOUSLY REPORTED AND CALLED VALUE.    No results found.    Assessment/Plan RUQ/epigastric abdominal pain -CBC and LFT's are normal, but her story is consistent with cholecystitis vs symptomatic gallstones.  Will get dedicated RUQ [place] today and HIDA tomorrow to ensure cholecystitis.  Repeat labs in AM. -NPO after MN, can have clears if tolerating, IVF, pain control, antiemetics, antibiotics (Day #4 Zosyn) -SCD's and heparin for DVT proph -Ambulate and IS -May  very well need lap chole, but will await [place] and HIDA -Hold pain meds after MN for HIDA Cholelithiasis NSTEMI - Will need cardiology to weigh in about her surgical risks.  Talked to cards PA - pending a call back, but they can proceed with cardiac cath prior to [place] considering lap chole.  Would not prefer DES, and would need to hold blood thinners prior to surgery.    Nat Christen, [facility] Surgery 02/12/2015, 2:55 PM Pager: [phone]

## 2015-02-12 NOTE — Progress Notes (Signed)
[place] TEAM 1 - Stepdown/ICU TEAM Progress Note  Sya Nestler YWV:[id number] DOB: [birth year] DOA: 02/09/2015 PCP: [name], MD  Admit HPI / Brief Narrative: Emily Key is a 44 y.o. BF PMHx Hypertension, peripartum CHF, empyema + lung abscess LLL 11/2014 S/P left VATS  She was admitted from 7/22-7/27 with presumed enteritis. Her GI pathogen panel during that admission was completely negative. She was discharged on 7 days of Cipro/Flagyl. She went home and was doing well. She was almost finished with her antibiotic therapy and eating solid food. She states her bowel movements had not completely normalized but were better. Yesterday at dinner she ate fried chicken and green beans. Suddenly on 7/31 close to midnight her abdominal pain returned and she began to vomit and had dark diarrhea. She reports having a minimum of 5 diarrhea/vomiting episodes overnight. Her abdominal pain both upper quadrants is severe. Nothing seemed to relieve it at home. It has been relieved by hydromorphone in the ER. Her blood pressure the time of admission was 216/144, temperature 100.3, pulse rate 139, respirations 42, her white count is 14.9 hemoglobin 16.6, platelets 437. Fortunately her creatinine is 1.04.  She is being admitted to the stepdown unit for severe sepsis.  HPI/Subjective: 8/4 negative chest pain, positive nausea this a.m. with increased abdominal pain which woke her from sleep.    Assessment/Plan: Severe sepsis with lactic acidosis - ?Tubo-ovarian abscess -Unknown cause; leukocytosis resolved; still not a good cause for patient's abdominal pain have consult [facility] for second opinion, -GYN feels pain not GYN in etiology; right hydrosalpinx felt to be chronic; follow-up with GYN as outpatient -Continue Zosyn  -  Abdominal pain, vomiting, diarrhea. -At discharge on 7/27 previous GI pathogen panel negative -GI pathogen panel pending -Stool culture pending -CT abdomen pelvis; cystic lesion  right adnexal space; see results below. -Hepatitis panel negative  NSTEMI? -Spoke with Dr. Grayland Jack (cardiology) patient with EKG changes and increasing troponin feels patient will require cardiac cath after medically stable. -Continue trending troponins  Hypertensive urgency -This may be rebound hypertension from clonidine as she has been unable to take her medications due to vomiting. Clonidine patch 0.3 mg -Increase labetalol IV to 20 mg  q 4hr -Hydralazine IV 20 mg QID -Patient was weaned off nitroglycerin drip secondary to severe headache and tripped not controlling BP. -Continue hydralazine PRN SBP> 160  Left lung abscess/empyema -S/P VATS 11/2014  Substance abuse -Positive for marijuana   Code Status: FULL Family Communication: no family present at time of exam Disposition Plan: Resolution NSTEMI    Consultants: Dr. Grayland Jack (cardiology) Dr.John Glo Herring (GYN)  Procedure/Significant Events: 8/1 CT abdomen pelvis with contrast;. -3.8 cm cystic lesion in the right adnexal space with moderate volume intraperitoneal free fluid. More fluid than typically seen for cyst rupture. Pelvic ultrasound may prove helpful to further evaluate, as clinically warranted. -Tiny layering calcified stones in the gallbladder lumen appear to be new in the interval since the prior study. -Multiple bilateral nonobstructing renal stones. 8/2 transvaginal ultrasound;-Thick wall multiloculated cystic structure within the right adnexa.Hemorrhagic cysts, vs tubo-ovarian abscess vs Ovarian torsion and solid hemorrhagic right adnexal mass(less likely) -Moderate amount of free fluid in the pelvis. -4.2 cm posterior fundal uterine leiomyoma.   Culture 8/1 urine positive multiple species 8/1 left AC/forearm NGTD 8/1 MRSA by PCR negative 8/2 urine NGTD   Antibiotics: Zosyn 8/1>> Vancomycin 8/1>> stopped 8/3  DVT prophylaxis: Subcutaneous heparin   Devices    LINES / TUBES:  Continuous Infusions: . sodium chloride 50 mL/hr at 02/11/15 1129    Objective: VITAL SIGNS: Temp: 97.7 F (36.5 C) (08/04 1233) Temp Source: Oral (08/04 1233) BP: 156/79 mmHg (08/04 1300) Pulse Rate: 85 (08/04 1300) SPO2; FIO2:   Intake/Output Summary (Last 24 hours) at 02/12/15 1402 Last data filed at 02/12/15 1351  Gross per 24 hour  Intake    950 ml  Output   2200 ml  Net  -1250 ml     Exam: General: A/O 4, NAD, No acute respiratory distress Eyes: Negative headache, eye pain, double vision, negative scleral hemorrhage ENT: Negative Runny nose, negative ear pain, negative tinnitus, negative gingival bleeding,  Neck:  Negative scars, masses, torticollis, lymphadenopathy, JVD Lungs: Clear to auscultation bilaterally without wheezes or crackles Cardiovascular: Regular rhythm and rate without murmur gallop or rub normal S1 and S2 Abdomen: Positive diffuse abdominal pain greatest in the RUQ/RLQ, negative dysphagia,nondistended, soft, bowel sounds positive, no rebound, no ascites, no appreciable mass Extremities: No significant cyanosis, clubbing, or edema bilateral lower extremities Psychiatric:  Negative depression, negative anxiety, negative fatigue, negative mania  Neurologic:  Cranial nerves II through XII intact, tongue/uvula midline, all extremities muscle strength 5/5, sensation intact throughout,negative dysarthria, negative expressive aphasia, negative receptive aphasia.    Data Reviewed: Basic Metabolic Panel:  Recent Labs Lab 02/09/15 1241 02/09/15 1945 02/10/15 0657 02/11/15 0710 02/12/15 0242  NA 136  --  136 134* 132*  K 3.5  --  3.8 4.3 3.9  CL 97*  --  104 104 98*  CO2 23  --  22 22 23   GLUCOSE 204*  --  101* 130* 89  BUN 9  --  7 <5* 6  CREATININE 1.04*  --  0.98 0.88 0.90  CALCIUM 10.3  --  8.4* 8.8* 9.2  MG  --  1.6* 2.3 1.9  --    Liver Function Tests:  Recent Labs Lab 02/09/15 1241 02/10/15 0657 02/11/15 0710 02/12/15 0242   AST 43* 34 33 38  ALT 24 15 15 15   ALKPHOS 71 46 42 42  BILITOT 0.9 0.7 0.9 0.8  PROT 10.0* 6.7 6.6 7.3  ALBUMIN 4.4 3.2* 3.3* 3.5    Recent Labs Lab 02/09/15 1241  LIPASE 12*   No results for input(s): AMMONIA in the last 168 hours. CBC:  Recent Labs Lab 02/09/15 1241 02/10/15 0657 02/11/15 0710 02/12/15 0241  WBC 14.9* 12.8* 10.0 7.1  NEUTROABS  --   --  6.2  --   HGB 16.6* 12.7 12.3 13.2  HCT 47.8* 38.8 37.2 39.0  MCV 79.8 81.0 82.1 80.1  PLT 437* 366 354 342   Cardiac Enzymes:  Recent Labs Lab 02/10/15 0657 02/10/15 1925 02/11/15 0053 02/11/15 0710 02/12/15 0242  TROPONINI 0.77* 1.53* 1.71* 1.74* 1.75*   BNP (last 3 results)  Recent Labs  11/12/14 0355 02/09/15 1557  BNP 138.6* 3120.5*    ProBNP (last 3 results) No results for input(s): PROBNP in the last 8760 hours.  CBG: No results for input(s): GLUCAP in the last 168 hours.  Recent Results (from the past 240 hour(s))  Ova and parasite examination     Status: None   Collection Time: 02/03/15 10:03 PM  Result Value Ref Range Status   Specimen Description STOOL  Final   Special Requests NONE  Final   Ova and parasites   Final    NO OVA OR PARASITES SEEN Performed at [facility]    Report Status 02/04/2015 FINAL  Final  Stool culture     Status: None   Collection Time: 02/03/15 10:03 PM  Result Value Ref Range Status   Specimen Description STOOL  Final   Special Requests NONE  Final   Culture   Final    NO SALMONELLA, SHIGELLA, CAMPYLOBACTER, YERSINIA, OR E.COLI 0157:H7 ISOLATED Performed at [facility]    Report Status 02/07/2015 FINAL  Final  Urine culture     Status: None   Collection Time: 02/09/15 12:42 PM  Result Value Ref Range Status   Specimen Description URINE, RANDOM  Final   Special Requests NONE  Final   Culture MULTIPLE SPECIES PRESENT, SUGGEST RECOLLECTION  Final   Report Status 02/10/2015 FINAL  Final  Blood culture (routine x 2)     Status: None  (Preliminary result)   Collection Time: 02/09/15  1:22 PM  Result Value Ref Range Status   Specimen Description BLOOD LEFT ANTECUBITAL  Final   Special Requests BOTTLES DRAWN AEROBIC AND ANAEROBIC 5CC  Final   Culture NO GROWTH 2 DAYS  Final   Report Status PENDING  Incomplete  Blood culture (routine x 2)     Status: None (Preliminary result)   Collection Time: 02/09/15  1:54 PM  Result Value Ref Range Status   Specimen Description BLOOD LEFT FOREARM  Final   Special Requests BOTTLES DRAWN AEROBIC AND ANAEROBIC 5CC  Final   Culture NO GROWTH 2 DAYS  Final   Report Status PENDING  Incomplete  MRSA PCR Screening     Status: None   Collection Time: 02/09/15  4:56 PM  Result Value Ref Range Status   MRSA by PCR NEGATIVE NEGATIVE Final    Comment:        The GeneXpert MRSA Assay (FDA approved for NASAL specimens only), is one component of a comprehensive MRSA colonization surveillance program. It is not intended to diagnose MRSA infection nor to guide or monitor treatment for MRSA infections.   Urine culture     Status: None   Collection Time: 02/10/15  7:16 AM  Result Value Ref Range Status   Specimen Description URINE, RANDOM  Final   Special Requests NONE  Final   Culture NO GROWTH 1 DAY  Final   Report Status 02/11/2015 FINAL  Final     Studies:  Recent x-ray studies have been reviewed in detail by the Attending Physician  Scheduled Meds:  Scheduled Meds: . acetaminophen  650 mg Oral Once  . amLODipine  10 mg Oral Daily  . [START ON 02/17/2015] cloNIDine  0.3 mg Transdermal Weekly  . heparin subcutaneous  5,000 Units Subcutaneous 3 times per day  . hydrALAZINE  20 mg Intravenous Q6H  . labetalol  20 mg Intravenous Q4H  . piperacillin-tazobactam (ZOSYN)  IV  3.375 g Intravenous Q8H  . sodium chloride  3 mL Intravenous Q12H    Time spent on care of this patient: 40 mins   WOODS, Geraldo Docker , MD  [facility] Office  [phone] Pager -  [phone]  On-Call/Text Page:      [email or web address]      password [email or web address]  If 7PM-7AM, please contact night-coverage [email or web address] Password [email or web address] 02/12/2015, 2:02 PM   LOS: 3 days   Care during the described time interval was provided by me .  I have reviewed this patient's available data, including medical history, events of note, physical examination, and all test results as part of my evaluation. I have personally reviewed and interpreted all radiology studies.   Dia Crawford,  MD [phone] Pager

## 2015-02-13 ENCOUNTER — Encounter (HOSPITAL_COMMUNITY)
Admission: EM | Disposition: A | Discharge status: Home / Self Care | Payer: Medicaid Other | Source: Home / Self Care | Attending: Internal Medicine

## 2015-02-13 ENCOUNTER — Inpatient Hospital Stay: Payer: Medicaid Other | Admitting: Family Medicine

## 2015-02-13 ENCOUNTER — Encounter (HOSPITAL_COMMUNITY): Payer: Self-pay | Admitting: Cardiovascular Disease

## 2015-02-13 DIAGNOSIS — I10 Essential (primary) hypertension: Secondary | ICD-10-CM | POA: Insufficient documentation

## 2015-02-13 HISTORY — PX: CARDIAC CATHETERIZATION: SHX172

## 2015-02-13 LAB — CBC
HCT: 37.8 % (ref 36.0–46.0)
Hemoglobin: 12.6 g/dL (ref 12.0–15.0)
MCH: 26.8 pg (ref 26.0–34.0)
MCHC: 33.3 g/dL (ref 30.0–36.0)
MCV: 80.3 fL (ref 78.0–100.0)
Platelets: 334 10*3/uL (ref 150–400)
RBC: 4.71 MIL/uL (ref 3.87–5.11)
RDW: 17.1 % — ABNORMAL HIGH (ref 11.5–15.5)
WBC: 7.5 10*3/uL (ref 4.0–10.5)

## 2015-02-13 LAB — COMPREHENSIVE METABOLIC PANEL
ALK PHOS: 36 U/L — AB (ref 38–126)
ALT: 16 U/L (ref 14–54)
ANION GAP: 8 (ref 5–15)
AST: 69 U/L — AB (ref 15–41)
Albumin: 3.4 g/dL — ABNORMAL LOW (ref 3.5–5.0)
BILIRUBIN TOTAL: 1 mg/dL (ref 0.3–1.2)
BUN: 14 mg/dL (ref 6–20)
CALCIUM: 9.1 mg/dL (ref 8.9–10.3)
CHLORIDE: 101 mmol/L (ref 101–111)
CO2: 24 mmol/L (ref 22–32)
Creatinine, Ser: 0.97 mg/dL (ref 0.44–1.00)
GFR calc non Af Amer: >60 mL/min (ref >60)
Glucose, Bld: 83 mg/dL (ref 65–99)
POTASSIUM: 3.7 mmol/L (ref 3.5–5.1)
Sodium: 133 mmol/L — ABNORMAL LOW (ref 135–145)
TOTAL PROTEIN: 7 g/dL (ref 6.5–8.1)

## 2015-02-13 LAB — LIPASE, BLOOD: Lipase: 16 U/L — ABNORMAL LOW (ref 22–51)

## 2015-02-13 SURGERY — LEFT HEART CATH AND CORONARY ANGIOGRAPHY

## 2015-02-13 MED ORDER — SODIUM CHLORIDE 0.9 % WEIGHT BASED INFUSION
3.0000 mL/kg/h | INTRAVENOUS | Status: AC
  Administered 2015-02-13: 3 mL/kg/h via INTRAVENOUS

## 2015-02-13 MED ORDER — SODIUM CHLORIDE 0.9 % IV SOLN: Freq: Once | INTRAVENOUS | Status: DC

## 2015-02-13 MED ORDER — SODIUM CHLORIDE 0.9 % IJ SOLN: 3.0000 mL | INTRAMUSCULAR | Status: DC | PRN

## 2015-02-13 MED ORDER — MIDAZOLAM HCL 2 MG/2ML IJ SOLN
INTRAMUSCULAR | Status: DC | PRN
  Administered 2015-02-13: 1 mg via INTRAVENOUS
  Administered 2015-02-13: 2 mg via INTRAVENOUS

## 2015-02-13 MED ORDER — ACETAMINOPHEN 325 MG PO TABS
650.0000 mg | ORAL_TABLET | ORAL | Status: DC | PRN
  Administered 2015-02-14: 325 mg via ORAL
  Filled 2015-02-13: qty 2

## 2015-02-13 MED ORDER — ONDANSETRON HCL 4 MG/2ML IJ SOLN: 4.0000 mg | Freq: Four times a day (QID) | INTRAMUSCULAR | Status: DC | PRN

## 2015-02-13 MED ORDER — ASPIRIN 81 MG PO CHEW
81.0000 mg | CHEWABLE_TABLET | ORAL | Status: AC
  Administered 2015-02-13: 81 mg via ORAL
  Filled 2015-02-13: qty 1

## 2015-02-13 MED ORDER — MIDAZOLAM HCL 2 MG/2ML IJ SOLN
INTRAMUSCULAR | Status: AC
Start: 2015-02-13 — End: 2015-02-13
  Filled 2015-02-13: qty 4

## 2015-02-13 MED ORDER — MIDAZOLAM HCL 2 MG/2ML IJ SOLN
INTRAMUSCULAR | Status: AC
  Filled 2015-02-13: qty 4

## 2015-02-13 MED ORDER — FENTANYL CITRATE (PF) 100 MCG/2ML IJ SOLN
INTRAMUSCULAR | Status: AC
Start: 2015-02-13 — End: 2015-02-13
  Filled 2015-02-13: qty 4

## 2015-02-13 MED ORDER — SODIUM CHLORIDE 0.9 % IJ SOLN
3.0000 mL | Freq: Two times a day (BID) | INTRAMUSCULAR | Status: DC
  Administered 2015-02-13: 3 mL via INTRAVENOUS
  Administered 2015-02-14: 10 mL via INTRAVENOUS

## 2015-02-13 MED ORDER — SODIUM CHLORIDE 0.9 % IJ SOLN: 3.0000 mL | Freq: Two times a day (BID) | INTRAMUSCULAR | Status: DC

## 2015-02-13 MED ORDER — SODIUM CHLORIDE 0.9 % IV SOLN
250.0000 mL | INTRAVENOUS | Status: DC | PRN
Start: 2015-02-13 — End: 2015-02-14

## 2015-02-13 MED ORDER — HEPARIN (PORCINE) IN NACL 2-0.9 UNIT/ML-% IJ SOLN
INTRAMUSCULAR | Status: AC
  Filled 2015-02-13: qty 1000

## 2015-02-13 MED ORDER — IOHEXOL 350 MG/ML SOLN
INTRAVENOUS | Status: DC | PRN
  Administered 2015-02-13: 60 mL via INTRA_ARTERIAL

## 2015-02-13 MED ORDER — LIDOCAINE HCL (PF) 1 % IJ SOLN
INTRAMUSCULAR | Status: AC
  Filled 2015-02-13: qty 30

## 2015-02-13 MED ORDER — FENTANYL CITRATE (PF) 100 MCG/2ML IJ SOLN
INTRAMUSCULAR | Status: DC | PRN
  Administered 2015-02-13 (×2): 25 ug via INTRAVENOUS

## 2015-02-13 MED ORDER — ASPIRIN 81 MG PO CHEW: 81.0000 mg | CHEWABLE_TABLET | ORAL | Status: DC

## 2015-02-13 MED ORDER — SODIUM CHLORIDE 0.9 % IV SOLN: 250.0000 mL | INTRAVENOUS | Status: DC | PRN

## 2015-02-13 SURGICAL SUPPLY — 8 items
CATH INFINITI 5FR MULTPACK ANG (CATHETERS) ×3
KIT HEART LEFT (KITS) ×3
PACK CARDIAC CATHETERIZATION (CUSTOM PROCEDURE TRAY) ×3
SHEATH PINNACLE 5F 10CM (SHEATH) ×3
SYR MEDRAD MARK V 150ML (SYRINGE) ×3
TRANSDUCER W/STOPCOCK (MISCELLANEOUS) ×3
TUBING CIL FLEX 10 FLL-RA (TUBING) ×3
WIRE EMERALD 3MM-J .035X150CM (WIRE) ×3

## 2015-02-13 NOTE — Progress Notes (Signed)
Site area: right groin a 5 French arterial sheath was  removed  Site Prior to Removal:  Level 0  Pressure Applied For 20 MINUTES    Minutes Beginning at 1130p  Manual:   Yes.    Patient Status During Pull:  stable  Post Pull Groin Site:  Level 0  Post Pull Instructions Given:  Yes.    Post Pull Pulses Present:  Yes.    Dressing Applied:  Yes.    Comments:  VS remain stable during sheath pull.  Pt denies any discomfort at site at this time

## 2015-02-13 NOTE — Progress Notes (Signed)
[facility] TEAM 1 - Stepdown/ICU TEAM Progress Note  Emily Key ZOX:[id number] DOB: 1970-07-19 DOA: 02/09/2015 PCP: [name], MD  Admit HPI / Brief Narrative: 44 y.o. F Hx Hypertension, peripartum CHF, and empyema + lung abscess LLL 11/2014 S/P left VATS who was admitted from 7/22-7/27 with presumed enteritis. Her GI pathogen panel during that admission was completely negative. She was discharged on 7 days of Cipro/Flagyl. She went home and was doing well. She was almost finished with her antibiotic therapy and eating solid food. She stated her bowel movements had not completely normalized but were better. Suddenly on 7/31 close to midnight her abdominal pain returned and she began to vomit and had dark diarrhea. She reported a minimum of 5 diarrhea/vomiting episodes.   In the ED her blood pressure was 216/144, temperature 100.3, pulse rate 139, respirations 42, white count 14.9 hemoglobin 16.6, platelets 437.  HPI/Subjective: The pt states she is feeling much better this morning.  She has no abdom pain whatsoever.  She has still not yet moved her bowels.  She denies cp, sob, nv, or ha.  She feels she is essentially back to normal at this time, other than her constipation.    Assessment/Plan:  Severe sepsis with lactic acidosis - idiopathic Narrow antibiotic to cover potential tubo-ovarian abscess - GYN formally consulted to comment on possible need to drain or explore surgically versus medical treatment only  Abdom pain w/ vomiting and intermittent diarrhea  there was concern this was her GB, but GB US was unrevealing w/ no evidence of gallstones - GI consulted by Gen Surg to consider EGD to eval for PUD  ?Tubo-ovarian abscess GYN has evaluated and does not feel she has a true Tubo-ovarian abscess, and instead feels this represents a right hydrosalpinx as well as cyst on the right ovary - no further evaluation planned as inpatient - GYN suggest doxy and flagyl to complete a full empiric  tx course w/ short interval f/u in the [facility] for eventual addressing of the hydrosalpinx  Mild troponin elevation - T wave inversions anterolateral Cards following - troponin peak 1.7 thus far - for cardiac cath today   Hypertensive urgency -BP control not yet ideal, but much improved - cont to follow w/o change in tx plan today   Recent Left lung abscess/empyema -S/P VATS 11/2014 - no apparent complications at the present time  Code Status: FULL Family Communication: no family present at time of exam Disposition Plan: SDU  Consultants: Cardiology GYN Gen Surgery  [facility]  Procedures: 8/2 - TTE - EF 40-45% - akinesis of apex - grade 2 DD / mod LVH  8/5 - cardiac cath - pending   Antibiotics: Zosyn 8/1 > Vancomycin 8/1 > 8/2  DVT prophylaxis: Subcutaneous heparin  Objective: Blood pressure 152/81, pulse 72, temperature 97.5 F (36.4 C), temperature source Oral, resp. rate 15, height 5\' 8"  (1.727 m), weight 86.728 kg (191 lb 3.2 oz), last menstrual period 12/31/2014, SpO2 100 %.  Intake/Output Summary (Last 24 hours) at 02/13/15 1014 Last data filed at 02/13/15 0600  Gross per 24 hour  Intake   1100 ml  Output    725 ml  Net    375 ml   Exam: General: No acute respiratory distress - alert and pleasant  Lungs: Clear to auscultation bilaterally without wheezes or crackles Cardiovascular: Regular rate and rhythm without murmur gallop or rub  Abdomen: nontender, nondistended, no appreciable mass, bowel sounds positive Extremities: No significant cyanosis, clubbing,  edema bilateral lower extremities    Data Reviewed: Basic Metabolic Panel:  Recent Labs Lab 02/09/15 1241 02/09/15 1945 02/10/15 0657 02/11/15 0710 02/12/15 0242 02/13/15 0500  NA 136  --  136 134* 132* 133*  K 3.5  --  3.8 4.3 3.9 3.7  CL 97*  --  104 104 98* 101  CO2 23  --  22 22 23 24   GLUCOSE 204*  --  101* 130* 89 83  BUN 9  --  7 <5* 6 14  CREATININE 1.04*  --  0.98  0.88 0.90 0.97  CALCIUM 10.3  --  8.4* 8.8* 9.2 9.1  MG  --  1.6* 2.3 1.9  --   --    Liver Function Tests:  Recent Labs Lab 02/09/15 1241 02/10/15 0657 02/11/15 0710 02/12/15 0242 02/13/15 0500  AST 43* 34 33 38 69*  ALT 24 15 15 15 16   ALKPHOS 71 46 42 42 36*  BILITOT 0.9 0.7 0.9 0.8 1.0  PROT 10.0* 6.7 6.6 7.3 7.0  ALBUMIN 4.4 3.2* 3.3* 3.5 3.4*    Recent Labs Lab 02/09/15 1241 02/13/15 0500  LIPASE 12* 16*   CBC:  Recent Labs Lab 02/09/15 1241 02/10/15 0657 02/11/15 0710 02/12/15 0241 02/13/15 0500  WBC 14.9* 12.8* 10.0 7.1 7.5  NEUTROABS  --   --  6.2  --   --   HGB 16.6* 12.7 12.3 13.2 12.6  HCT 47.8* 38.8 37.2 39.0 37.8  MCV 79.8 81.0 82.1 80.1 80.3  PLT 437* 366 354 342 334   Cardiac Enzymes:  Recent Labs Lab 02/10/15 0657 02/10/15 1925 02/11/15 0053 02/11/15 0710 02/12/15 0242  TROPONINI 0.77* 1.53* 1.71* 1.74* 1.75*   BNP (last 3 results)  Recent Labs  11/12/14 0355 02/09/15 1557  BNP 138.6* 3120.5*     Recent Results (from the past 240 hour(s))  Ova and parasite examination     Status: None   Collection Time: 02/03/15 10:03 PM  Result Value Ref Range Status   Specimen Description STOOL  Final   Special Requests NONE  Final   Ova and parasites   Final    NO OVA OR PARASITES SEEN Performed at [facility]    Report Status 02/04/2015 FINAL  Final  Stool culture     Status: None   Collection Time: 02/03/15 10:03 PM  Result Value Ref Range Status   Specimen Description STOOL  Final   Special Requests NONE  Final   Culture   Final    NO SALMONELLA, SHIGELLA, CAMPYLOBACTER, YERSINIA, OR E.COLI 0157:H7 ISOLATED Performed at [facility]    Report Status 02/07/2015 FINAL  Final  Urine culture     Status: None   Collection Time: 02/09/15 12:42 PM  Result Value Ref Range Status   Specimen Description URINE, RANDOM  Final   Special Requests NONE  Final   Culture MULTIPLE SPECIES PRESENT, SUGGEST RECOLLECTION   Final   Report Status 02/10/2015 FINAL  Final  Blood culture (routine x 2)     Status: None (Preliminary result)   Collection Time: 02/09/15  1:22 PM  Result Value Ref Range Status   Specimen Description BLOOD LEFT ANTECUBITAL  Final   Special Requests BOTTLES DRAWN AEROBIC AND ANAEROBIC 5CC  Final   Culture NO GROWTH 3 DAYS  Final   Report Status PENDING  Incomplete  Blood culture (routine x 2)     Status: None (Preliminary result)   Collection Time: 02/09/15  1:54 PM  Result  Value Ref Range Status   Specimen Description BLOOD LEFT FOREARM  Final   Special Requests BOTTLES DRAWN AEROBIC AND ANAEROBIC 5CC  Final   Culture NO GROWTH 3 DAYS  Final   Report Status PENDING  Incomplete  MRSA PCR Screening     Status: None   Collection Time: 02/09/15  4:56 PM  Result Value Ref Range Status   MRSA by PCR NEGATIVE NEGATIVE Final    Comment:        The GeneXpert MRSA Assay (FDA approved for NASAL specimens only), is one component of a comprehensive MRSA colonization surveillance program. It is not intended to diagnose MRSA infection nor to guide or monitor treatment for MRSA infections.   Urine culture     Status: None   Collection Time: 02/10/15  7:16 AM  Result Value Ref Range Status   Specimen Description URINE, RANDOM  Final   Special Requests NONE  Final   Culture NO GROWTH 1 DAY  Final   Report Status 02/11/2015 FINAL  Final     Studies:  Recent x-ray studies have been reviewed in detail by the Attending Physician  Scheduled Meds:  Scheduled Meds: . sodium chloride   Intravenous Once  . acetaminophen  650 mg Oral Once  . amLODipine  10 mg Oral Daily  . [START ON 02/17/2015] cloNIDine  0.3 mg Transdermal Weekly  . heparin subcutaneous  5,000 Units Subcutaneous 3 times per day  . hydrALAZINE  20 mg Intravenous Q6H  . labetalol  20 mg Intravenous Q4H  . piperacillin-tazobactam (ZOSYN)  IV  3.375 g Intravenous Q8H  . sodium chloride  10-40 mL Intracatheter Q12H  .  sodium chloride  3 mL Intravenous Q12H  . sodium chloride  3 mL Intravenous Q12H    Time spent on care of this patient: 35 mins  Cherene Altes, MD [facility] For Consults/Admissions - Flow Manager - [phone] Office  [phone]  Contact MD directly via text page:      [email or web address]      password [email or web address]  02/13/2015, 10:14 AM   LOS: 4 days

## 2015-02-13 NOTE — Interval H&P Note (Signed)
History and Physical Interval Note:  02/13/2015 10:57 AM  Emily Key  has presented today for surgery, with the diagnosis of pre-op clearence/lv disfunction  The various methods of treatment have been discussed with the patient and family. After consideration of risks, benefits and other options for treatment, the patient has consented to  Procedure(s): Left Heart Cath and Coronary Angiography (N/A) as a surgical intervention .  The patient's history has been reviewed, patient examined, no change in status, stable for surgery.  I have reviewed the patient's chart and labs.  Questions were answered to the patient's satisfaction.     Shlomo Seres A

## 2015-02-13 NOTE — Progress Notes (Signed)
Pt received from cath lab to room 2W05, alert and orientedx 4, vitals are  T=97.7,  BP=178/80, RR=18, HR=89, 100% on RA, currently denies pain. Pt oriented to the unit, will continue to monitor pt.

## 2015-02-13 NOTE — H&P (View-Only) (Signed)
Patient Name: Emily Key Date of Encounter: 02/13/2015  Primary Cardiologist: Dr. Acie Fredrickson   Principal Problem:   Severe sepsis Active Problems:   Hypertensive urgency   Lactic acidosis   Abdominal pain, vomiting, and diarrhea   NSTEMI (non-ST elevated myocardial infarction)   Abscess   Substance abuse   Pain in the chest   Abdominal pain   Lung abscess   Empyema lung    SUBJECTIVE  No significant pain. SOB improved. No BM for 2 days.  Gallbladder u/s normal.  CURRENT MEDS . sodium chloride   Intravenous Once  . acetaminophen  650 mg Oral Once  . amLODipine  10 mg Oral Daily  . aspirin  81 mg Oral Pre-Cath  . [START ON 02/17/2015] cloNIDine  0.3 mg Transdermal Weekly  . heparin subcutaneous  5,000 Units Subcutaneous 3 times per day  . hydrALAZINE  20 mg Intravenous Q6H  . labetalol  20 mg Intravenous Q4H  . piperacillin-tazobactam (ZOSYN)  IV  3.375 g Intravenous Q8H  . sodium chloride  10-40 mL Intracatheter Q12H  . sodium chloride  3 mL Intravenous Q12H  . sodium chloride  3 mL Intravenous Q12H    OBJECTIVE  Filed Vitals:   02/13/15 0500 02/13/15 0516 02/13/15 0600 02/13/15 0751  BP: 121/60 121/60 145/76 147/76  Pulse: 89  74 84  Temp:    97.5 F (36.4 C)  TempSrc:    Oral  Resp: 15  14 12   Height:      Weight: 191 lb 3.2 oz (86.728 kg)     SpO2:    98%    Intake/Output Summary (Last 24 hours) at 02/13/15 0827 Last data filed at 02/13/15 0600  Gross per 24 hour  Intake   1300 ml  Output    725 ml  Net    575 ml   Filed Weights   02/11/15 0500 02/12/15 0500 02/13/15 0500  Weight: 196 lb 8 oz (89.132 kg) 192 lb 11.2 oz (87.408 kg) 191 lb 3.2 oz (86.728 kg)    PHYSICAL EXAM  General: Pleasant, NAD. Neuro: Alert and oriented X 3. Moves all extremities spontaneously. Psych: Normal affect. HEENT:  Normal  Neck: Supple without bruits or JVD. Lungs:  Resp regular and unlabored, CTA. Heart: RRR no s3, s4, or murmurs. Abdomen: Soft, non-tender,  non-distended, BS + x 4.  Extremities: No clubbing, cyanosis or edema. DP/PT/Radials 2+ and equal bilaterally.  Accessory Clinical Findings  CBC  Recent Labs  02/11/15 0710 02/12/15 0241 02/13/15 0500  WBC 10.0 7.1 7.5  NEUTROABS 6.2  --   --   HGB 12.3 13.2 12.6  HCT 37.2 39.0 37.8  MCV 82.1 80.1 80.3  PLT 354 342 242   Basic Metabolic Panel  Recent Labs  02/11/15 0710 02/12/15 0242 02/13/15 0500  NA 134* 132* 133*  K 4.3 3.9 3.7  CL 104 98* 101  CO2 22 23 24   GLUCOSE 130* 89 83  BUN <5* 6 14  CREATININE 0.88 0.90 0.97  CALCIUM 8.8* 9.2 9.1  MG 1.9  --   --    Liver Function Tests  Recent Labs  02/12/15 0242 02/13/15 0500  AST 38 69*  ALT 15 16  ALKPHOS 42 36*  BILITOT 0.8 1.0  PROT 7.3 7.0  ALBUMIN 3.5 3.4*    Recent Labs  02/13/15 0500  LIPASE 16*   Cardiac Enzymes  Recent Labs  02/11/15 0053 02/11/15 0710 02/12/15 0242  TROPONINI 1.71* 1.74* 1.75*    TELE  NSR with HR 80s    ECG  No new EKG  Echocardiogram 02-22-15  LV EF: 40% -  45%  ------------------------------------------------------------------- Indications:   Chest pain 786.51.  ------------------------------------------------------------------- History:  PMH: NSTEMI. Hy[ertensive urgency. Severe sepsis. Lactic acidosis.  ------------------------------------------------------------------- Study Conclusions  - Left ventricle: The cavity size was normal. Wall thickness was increased in a pattern of moderate LVH. Systolic function was mildly to moderately reduced. The estimated ejection fraction was in the range of 40% to 45%. Akinesis of the apical myocardium. Features are consistent with a pseudonormal left ventricular filling pattern, with concomitant abnormal relaxation and increased filling pressure (grade 2 diastolic dysfunction). - Mitral valve: There was mild regurgitation. - Left atrium: The atrium was mildly dilated. - Pericardium,  extracardiac: A trivial pericardial effusion was identified.    Radiology/Studies  US Transvaginal Non-ob  02-22-2015   CLINICAL DATA:  Right adnexal cystic lesion with associated intraperitoneal free fluid.  EXAM: TRANSABDOMINAL AND TRANSVAGINAL ULTRASOUND OF PELVIS  TECHNIQUE: Both transabdominal and transvaginal ultrasound examinations of the pelvis were performed. Transabdominal technique was performed for global imaging of the pelvis including uterus, ovaries, adnexal regions, and pelvic cul-de-sac. It was necessary to proceed with endovaginal exam following the transabdominal exam to visualize the right adnexa.  COMPARISON:  CT of the abdomen pelvis 02/09/2015  FINDINGS: Uterus  Measurements: 9.0 by 5.7 by 5.9 cm. There is a posterior fundal intramural circumscribed soft tissue mass with echogenicity similar to the myometrium likely representing a leiomyoma, which measures 4.2 by 3.3 by 3.7 cm.  Endometrium  Thickness: 6.3 mm.  No focal abnormality visualized.  Right ovary  Measurements: 5.8 x 3.5 x 4.5 cm. It contains 2 thick-walled loculated cystic structures demonstrating intramural low level echogenicity. The larger of these measures 3.2 x 3.2 x 3.7 cm, and the smaller one measures 2.3 x 2.3 x 2.3 cm. No internal blood flow is seen.  Left ovary  Measurements: 2.4 x 1.9 by 2.2. Normal appearance/no adnexal mass.  Other findings  Moderate amount of free fluid is present in the cul-de-sac and within the right adnexa.  IMPRESSION: Thick wall multiloculated cystic structure within the right adnexa. Differential diagnosis includes hemorrhagic cysts, or tubo-ovarian abscess. Ovarian torsion and solid hemorrhagic right adnexal mass are felt less likely, although theoretically possible.  Moderate amount of free fluid in the pelvis.  4.2 cm posterior fundal uterine leiomyoma.   Electronically Signed   By: Fidela Salisbury M.D.   On: 22-Feb-2015 14:27   US Pelvis Complete  02-22-2015   CLINICAL DATA:   Right adnexal cystic lesion with associated intraperitoneal free fluid.  EXAM: TRANSABDOMINAL AND TRANSVAGINAL ULTRASOUND OF PELVIS  TECHNIQUE: Both transabdominal and transvaginal ultrasound examinations of the pelvis were performed. Transabdominal technique was performed for global imaging of the pelvis including uterus, ovaries, adnexal regions, and pelvic cul-de-sac. It was necessary to proceed with endovaginal exam following the transabdominal exam to visualize the right adnexa.  COMPARISON:  CT of the abdomen pelvis 02/09/2015  FINDINGS: Uterus  Measurements: 9.0 by 5.7 by 5.9 cm. There is a posterior fundal intramural circumscribed soft tissue mass with echogenicity similar to the myometrium likely representing a leiomyoma, which measures 4.2 by 3.3 by 3.7 cm.  Endometrium  Thickness: 6.3 mm.  No focal abnormality visualized.  Right ovary  Measurements: 5.8 x 3.5 x 4.5 cm. It contains 2 thick-walled loculated cystic structures demonstrating intramural low level echogenicity. The larger of these measures 3.2 x 3.2 x 3.7 cm, and the smaller one  measures 2.3 x 2.3 x 2.3 cm. No internal blood flow is seen.  Left ovary  Measurements: 2.4 x 1.9 by 2.2. Normal appearance/no adnexal mass.  Other findings  Moderate amount of free fluid is present in the cul-de-sac and within the right adnexa.  IMPRESSION: Thick wall multiloculated cystic structure within the right adnexa. Differential diagnosis includes hemorrhagic cysts, or tubo-ovarian abscess. Ovarian torsion and solid hemorrhagic right adnexal mass are felt less likely, although theoretically possible.  Moderate amount of free fluid in the pelvis.  4.2 cm posterior fundal uterine leiomyoma.   Electronically Signed   By: Fidela Salisbury M.D.   On: 02/10/2015 14:27   Ct Abdomen Pelvis W Contrast  02/09/2015   CLINICAL DATA:  Initial encounter for left upper quadrant abdominal pain with vomiting and diarrhea.  EXAM: CT ABDOMEN AND PELVIS WITH CONTRAST   TECHNIQUE: Multidetector CT imaging of the abdomen and pelvis was performed using the standard protocol following bolus administration of intravenous contrast.  CONTRAST:  137mL OMNIPAQUE IOHEXOL 300 MG/ML  SOLN  COMPARISON:  01/30/2015.  FINDINGS: Lower chest:  Probable atelectasis in the lingula.  Hepatobiliary: No focal abnormality within the liver parenchyma. Tiny layering gallstones are noted in the dependent gallbladder (see image 62 of series 6). No evidence for gallbladder wall thickening or pericholecystic fluid. No intrahepatic or extrahepatic biliary dilation.  Pancreas: No focal mass lesion. No dilatation of the main duct. No intraparenchymal cyst. No peripancreatic edema.  Spleen: No splenomegaly.  No focal mass lesion.  Adrenals/Urinary Tract: No adrenal nodule or mass. The patient has at least 5 nonobstructing stones in the right kidney, the largest measuring about 3 mm. Three stones are seen in the left kidney without obstruction, all measuring approximately 4-5 mm. No enhancing lesion is seen in either kidney. There is no evidence for hydroureter. No ureteral or bladder stone.  Stomach/Bowel: Stomach is nondistended. No gastric wall thickening. No evidence of outlet obstruction. Duodenum is normally positioned as is the ligament of Treitz. No small bowel wall thickening. No small bowel dilatation. The terminal ileum is normal. The appendix is normal. Diverticular are seen in the region of the splenic flexure without diverticulitis of the colon.  Vascular/Lymphatic: There is abdominal aortic atherosclerosis without aneurysm. There is no gastrohepatic or hepatoduodenal ligament lymphadenopathy. No intraperitoneal or retroperitoneal lymphadenopy. No mesenteric or pelvic sidewall lymphadenopathy.  Reproductive: Uterus is unremarkable. 3.8 cm cystic lesion identified in the right adnexal space. Small to moderate volume intraperitoneal free fluid is evident. This is similar in appearance to the previous  study.  Other: Gas within the subcutaneous fat of the lower anterior right abdominal wall likely related to an injection site.  Musculoskeletal: Bone windows reveal no worrisome lytic or sclerotic osseous lesions.  IMPRESSION: 1. 3.8 cm cystic lesion in the right adnexal space with moderate volume intraperitoneal free fluid. This is more fluid than typically seen for cyst rupture. Pelvic ultrasound may prove helpful to further evaluate, as clinically warranted. 2. Tiny layering calcified stones in the gallbladder lumen appear to be new in the interval since the prior study. 3. Multiple bilateral nonobstructing renal stones.   Electronically Signed   By: Misty Stanley M.D.   On: 02/09/2015 19:38   Ct Abdomen Pelvis W Contrast  01/30/2015   CLINICAL DATA:  Abdominal pain with nausea and vomiting beginning at 1:00 a.m. 01/30/2015.  EXAM: CT ABDOMEN AND PELVIS WITH CONTRAST  TECHNIQUE: Multidetector CT imaging of the abdomen and pelvis was performed using the standard protocol  following bolus administration of intravenous contrast.  CONTRAST:  100 mL OMNIPAQUE IOHEXOL 300 MG/ML  SOLN  COMPARISON:  CT abdomen and pelvis 10/14/2013.  FINDINGS: Lung bases are clear. No pleural or pericardial effusion. Cardiomegaly noted.  Multiple small nonobstructing renal stones are seen bilaterally. There is no hydronephrosis on the right or left and no ureteral stones identified. The liver is low attenuating consistent with fatty infiltration. No focal liver lesion is identified. The gallbladder, adrenal glands, spleen, pancreas and biliary tree all appear normal.  There is a small volume of free pelvic fluid which is somewhat greater than typically seen in physiologic change. No focal fluid collection is identified. There is no lymphadenopathy. The patient is status post tubal ligation. The stomach, small and large bowel and appendix all appear normal. No lytic or sclerotic bony lesion is identified.  IMPRESSION: Small volume of  free pelvic fluid is slightly greater than typically seen in physiologic change. Although the bowel appears normal this finding could be due to enteritis.  Fatty infiltration of the liver.  Multiple bilateral nonobstructing renal stones.  Mild cardiomegaly.   Electronically Signed   By: Inge Rise M.D.   On: 01/30/2015 13:44   Dg Chest Portable 1 View  02/09/2015   CLINICAL DATA:  Kidney a weakness shortness of breath  EXAM: PORTABLE CHEST - 1 VIEW  COMPARISON:  01/30/2015  FINDINGS: Heart size within normal limits allowing for rotation and lordotic positioning. Vascular pattern normal. Lungs clear. Persistent minimal left costophrenic angle blunting.  IMPRESSION: Stable mild left blunting of the costophrenic angle suggesting pleural thickening or mild effusion.   Electronically Signed   By: Skipper Cliche M.D.   On: 02/09/2015 13:52   Dg Abd Acute W/chest  01/30/2015   CLINICAL DATA:  Acute generalized abdominal pain, vomiting.  EXAM: DG ABDOMEN ACUTE W/ 1V CHEST  COMPARISON:  Dec 09, 2014.  FINDINGS: There is no evidence of dilated bowel loops or free intraperitoneal air. Stable bilateral nephrolithiasis. Heart size and mediastinal contours are within normal limits. Stable left basilar scarring is noted. No acute pulmonary disease is noted.  IMPRESSION: No evidence of bowel obstruction or ileus. Stable bilateral nephrolithiasis. No acute cardiopulmonary disease.   Electronically Signed   By: Marijo Conception, M.D.   On: 01/30/2015 11:34   US Abdomen Limited Ruq  02/12/2015   CLINICAL DATA:  44 year old female with upper abdominal pain for the past 4 days with nausea and vomiting.  EXAM: US ABDOMEN LIMITED - RIGHT UPPER QUADRANT  COMPARISON:  CT the abdomen and pelvis 02/09/2015.  FINDINGS: Gallbladder:  No gallstones or wall thickening visualized. No sonographic Murphy sign noted.  Common bile duct:  Diameter: 2.2 mm  Liver:  No focal lesion identified. Within normal limits in parenchymal  echogenicity.  IMPRESSION: 1. No gallstones or findings to suggest an acute cholecystitis at this time.   Electronically Signed   By: Vinnie Langton M.D.   On: 02/12/2015 20:11    ASSESSMENT AND PLAN  Patient is a 44 y.o. female with a PMHx of hypertension, empyema-status post decortication who was admitted to [facility] on 02/09/2015 for evaluation of gastroenteritis and sepsis syndrome. She was found have a mildly positive troponin level and we were consulted for further evaluation.  1. Severe sepsis and GI discomfort - resolved. Minimal GI discomfort now. No BM 2 days, may be able to come off of enteric precautions - per primary team.   2. Elevated trop with new TWI in anterolateral  leads  - trop 1.7  - Echo 11/19/2014 EF 50-55%, small free flowing pericardial effusion  - Echo 02/10/2015 EF 40-45%, akinesis of apical myocardium, grade 2 diastolic dysfunction, moderate LVH, mild MR  - could be takotsubo's cardiomyopathy with recent sepsis, however will need cardiac cath to definitively assess.  - plan cath today - discussed risks and benefits (stroke, MI, Death, bleeding). If PCI needed, will be thoughtful given possible future surgical needs. Would like surgery to weigh in on this possibility. May not require since gall bladder u/s normal.   3. HTN - switch to PO when able.  4. Empyema s/p decortication 5. 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 Plan for outpatient workup  Signed, Candee Furbish MD

## 2015-02-13 NOTE — Consult Note (Signed)
Referring Provider:  Dr. Donne Hazel Primary Care Physician:  [name], MD Primary Gastroenterologist:  None (unassigned)  Reason for Consultation:  Abdominal pain,? Ulcer disease  HPI: Emily Key is a 44 y.o. female readmitted to the hospital several days ago. She had acute diarrhea, nausea and vomiting after possible food poisoning, having eaten a hamburger at [employer]. She improved and was discharged 8 days ago, but then had a recurrence of very similar symptoms with vomiting and diarrhea about 5 days ago, along with abdominal pain. The vomiting and diarrhea have resolved and in fact, today, for the first time, she was able to eat food without prompting those symptoms. However, she continues to have spells of pain across her upper abdomen and somewhat radiating down into the left side, which can last for hours, described as sort of a steady pressure with periodic exacerbations. Of note, she underwent a cardiac catheterization today that was negative for coronary disease (she did have mild elevation of troponins and a questionable cardiomyopathy, but her ejection fraction was normal on today's catheterization).  With respect to ulcer disease, it is noteworthy that the patient has not been having upper abdominal discomfort prior to the food poisoning episode, and has not had any prior history of ulcer disease, nor exposure to aspirin or non-steroidal anti-inflammatory drugs. There's been no problem with anorexia or weight loss. Her current pain as occurring randomly, without association with meals. She denies dyspeptic symptomatology such as heartburn or dysphagia apart from the fact that, since undergoing VATS for empyema earlier this year, she has had some slight abnormal sensation in the posterior pharyngeal region, but not true dysphagia.   Past Medical History  Diagnosis Date  . Hypertension   . CHF (congestive heart failure)     after last pregnancy  . Nausea, vomiting and  diarrhea 01/30/2015  . Heart murmur     "born w/one":  Marland Kitchen Pneumonia 11/2014  . History of hiatal hernia   . Daily headache     Past Surgical History  Procedure Laterality Date  . Video assisted thoracoscopy (vats)/decortication Left 11/14/2014    Procedure: LEFT VIDEO ASSISTED THORACOSCOPY (VATS)/DECORTICATION;  Surgeon: Melrose Nakayama, MD;  Location: [facility];  Service: Thoracic;  Laterality: Left;  . Pleural effusion drainage Left 11/14/2014    Procedure: DRAINAGE OF LEFT PLEURAL EFFUSION;  Surgeon: Melrose Nakayama, MD;  Location: [facility];  Service: Thoracic;  Laterality: Left;  . Tubal ligation  2000  . Tonsillectomy and adenoidectomy  ~ 1986  . Cardiac catheterization N/A 02/13/2015    Procedure: Left Heart Cath and Coronary Angiography;  Surgeon: Troy Sine, MD;  Location: [facility];  Service: Cardiovascular;  Laterality: N/A;    Prior to Admission medications   Medication Sig Start Date End Date Taking? Authorizing Provider  amLODipine (NORVASC) 10 MG tablet Take 1 tablet (10 mg total) by mouth daily. 11/22/14  Yes Kelvin Cellar, MD  cloNIDine (CATAPRES) 0.2 MG tablet Take 1 tablet (0.2 mg total) by mouth 3 (three) times daily. 11/22/14  Yes Kelvin Cellar, MD  labetalol (NORMODYNE) 100 MG tablet Take 1 tablet (100 mg total) by mouth 2 (two) times daily. 02/04/15  Yes Maryann Mikhail, DO  metroNIDAZOLE (FLAGYL) 500 MG tablet Take 1 tablet (500 mg total) by mouth every 8 (eight) hours. 02/04/15  Yes Maryann Mikhail, DO    Current Facility-Administered Medications  Medication Dose Route Frequency Provider Last Rate Last Dose  . 0.9 %  sodium chloride infusion  Intravenous Continuous Cherene Altes, MD 50 mL/hr at 02/11/15 1129    . 0.9 %  sodium chloride infusion  250 mL Intravenous PRN Troy Sine, MD      . acetaminophen (TYLENOL) tablet 650 mg  650 mg Oral Once [employer], PA-C   Stopped at 02/09/15 1441  . acetaminophen (TYLENOL) tablet 650 mg  650 mg  Oral Q4H PRN Troy Sine, MD      . alum & mag hydroxide-simeth (MAALOX/MYLANTA) 200-200-20 MG/5ML suspension 30 mL  30 mL Oral Q6H PRN Melton Alar, PA-C      . amLODipine (NORVASC) tablet 10 mg  10 mg Oral Daily Cherene Altes, MD   10 mg at 02/13/15 1016  . [START ON 02/17/2015] cloNIDine (CATAPRES - Dosed in mg/24 hr) patch 0.3 mg  0.3 mg Transdermal Weekly Cherene Altes, MD      . diphenhydrAMINE (BENADRYL) injection 12.5 mg  12.5 mg Intravenous Q6H PRN Melton Alar, PA-C      . heparin injection 5,000 Units  5,000 Units Subcutaneous 3 times per day Gardiner Barefoot, NP   5,000 Units at 02/13/15 1513  . hydrALAZINE (APRESOLINE) injection 10 mg  10 mg Intravenous Q6H PRN Melton Alar, PA-C   10 mg at 02/13/15 1403  . hydrALAZINE (APRESOLINE) injection 20 mg  20 mg Intravenous Q6H Cherene Altes, MD   20 mg at 02/13/15 1624  . HYDROmorphone (DILAUDID) injection 0.5 mg  0.5 mg Intravenous Q4H PRN Melton Alar, PA-C   0.5 mg at 02/13/15 1527  . labetalol (NORMODYNE,TRANDATE) injection 20 mg  20 mg Intravenous Q4H Allie Bossier, MD   20 mg at 02/13/15 2059  . ondansetron (ZOFRAN) tablet 4 mg  4 mg Oral Q6H PRN Melton Alar, PA-C       Or  . ondansetron (ZOFRAN) injection 4 mg  4 mg Intravenous Q6H PRN Melton Alar, PA-C   4 mg at 02/12/15 1206  . piperacillin-tazobactam (ZOSYN) IVPB 3.375 g  3.375 g Intravenous Q8H Rebecka Apley, RPH   3.375 g at 02/13/15 2058  . sodium chloride 0.9 % injection 10-40 mL  10-40 mL Intracatheter PRN Allie Bossier, MD      . sodium chloride 0.9 % injection 3 mL  3 mL Intravenous Q12H Troy Sine, MD   3 mL at 02/13/15 1514  . sodium chloride 0.9 % injection 3 mL  3 mL Intravenous PRN Troy Sine, MD        Allergies as of 02/09/2015  . (No Known Allergies)    Family History  Problem Relation Age of Onset  . Diabetes Mellitus II Mother   . Hypertension Mother   . Lung cancer Father     History   Social History  .  Marital Status: Divorced    Spouse Name: N/A  . Number of Children: N/A  . Years of Education: N/A   Occupational History  . Not on file.   Social History Main Topics  . Smoking status: Former Smoker -- 0.33 packs/day for 10 years    Types: Cigarettes    Quit date: 11/11/2014  . Smokeless tobacco: Never Used  . Alcohol Use: 0.0 oz/week    0 Standard drinks or equivalent per week     Comment: 8/1/20176 "a couple beers a couple times/month"  . Drug Use: No  . Sexual Activity: Yes   Other Topics Concern  . Not on file  Social History Narrative    Review of Systems: See history of present illness  Physical Exam: Vital signs in last 24 hours: Temp:  [97.5 F (36.4 C)-98.4 F (36.9 C)] 97.7 F (36.5 C) (08/05 1215) Pulse Rate:  [0-94] 94 (08/05 1400) Resp:  [0-62] 18 (08/05 1215) BP: (113-190)/(56-101) 159/73 mmHg (08/05 1633) SpO2:  [0 %-100 %] 100 % (08/05 1215) Weight:  [86.728 kg (191 lb 3.2 oz)] 86.728 kg (191 lb 3.2 oz) (08/05 0500) Last BM Date: 02/08/15 General:   Alert,  Well-developed, well-nourished, pleasant and cooperative in NAD Head:  Normocephalic and atraumatic. Eyes:  Sclera clear, no icterus.   Conjunctiva pink. Mouth:   No ulcerations or lesions.  Oropharynx pink & moist. Neck:   No masses or thyromegaly. Lungs:  Clear throughout to auscultation.   No wheezes, crackles, or rhonchi. No evident respiratory distress. Heart:   Regular rate and rhythm; 2/6 systolic murmur,no clicks, rubs,  or gallops. Abdomen:  Soft, nontender, nontympanitic, and nondistended. No masses, hepatosplenomegaly or ventral hernias noted. Normal bowel sounds, without bruits, guarding, or rebound.   Msk:   Symmetrical without gross deformities. Pulses:  Normal radial pulse is noted. Extremities:   Without clubbing, cyanosis, or edema. Neurologic:  Alert and coherent;  grossly normal neurologically. Skin:  Intact without significant lesions or rashes. Cervical Nodes:  No  significant cervical adenopathy. Psych:   Alert and cooperative. Normal mood and affect.  Intake/Output from previous day: 08/04 0701 - 08/05 0700 In: 1300 [I.V.:1200; IV Piggyback:100] Out: 725 [Urine:725] Intake/Output this shift:    Lab Results:  Recent Labs  02/11/15 0710 02/12/15 0241 02/13/15 0500  WBC 10.0 7.1 7.5  HGB 12.3 13.2 12.6  HCT 37.2 39.0 37.8  PLT 354 342 334   BMET  Recent Labs  02/11/15 0710 02/12/15 0242 02/13/15 0500  NA 134* 132* 133*  K 4.3 3.9 3.7  CL 104 98* 101  CO2 22 23 24   GLUCOSE 130* 89 83  BUN <5* 6 14  CREATININE 0.88 0.90 0.97  CALCIUM 8.8* 9.2 9.1   LFT  Recent Labs  02/13/15 0500  PROT 7.0  ALBUMIN 3.4*  AST 69*  ALT 16  ALKPHOS 36*  BILITOT 1.0   PT/INR No results for input(s): LABPROT, INR in the last 72 hours.  Studies/Results: US Abdomen Limited Ruq  02/12/2015   CLINICAL DATA:  44 year old female with upper abdominal pain for the past 4 days with nausea and vomiting.  EXAM: US ABDOMEN LIMITED - RIGHT UPPER QUADRANT  COMPARISON:  CT the abdomen and pelvis 02/09/2015.  FINDINGS: Gallbladder:  No gallstones or wall thickening visualized. No sonographic Murphy sign noted.  Common bile duct:  Diameter: 2.2 mm  Liver:  No focal lesion identified. Within normal limits in parenchymal echogenicity.  IMPRESSION: 1. No gallstones or findings to suggest an acute cholecystitis at this time.   Electronically Signed   By: Vinnie Langton M.D.   On: 02/12/2015 20:11    Impression: 1. Recurring upper abdominal pain, nonspecific both in its character and its location, not strongly suggestive of peptic ulcer disease.  2. Recent nausea, vomiting, and diarrhea, with recurrence after several days of quiescence, presumably a consequence of some sort of enteric infection, now apparently resolving.  Plan: 1. In the setting of upper abdominal pain, "When in doubt, treat acid." The patient is not currently on anti-peptic therapy, so I  will start empiric high-dose pantoprazole to try to take acid out of the equation  2.  From the GI tract perspective, I do not think the patient needs antibiotic therapy. She is currently on Zosyn. If there is not a specific indication for that medication, I would stop it, since it could lead to antibiotic associated diarrhea which could confuse the picture as to whether she is recovering from her previous enteric illness. It could also place her at risk for C. difficile infection.  3. I will start probiotic therapy to help replenish a favorable intestinal microbiome following a diarrheal illness and antibiotic exposure  4. Although I do not think the patient needs urgent endoscopy, if she continues to have nonspecific upper abdominal pain lasting weeks despite antibiotic therapy, I then think that an elective outpatient endoscopy would be appropriate.    LOS: 4 days   Liba Hulsey V  02/13/2015, 9:09 PM   Pager [phone] If no answer or after 5 PM call [phone]

## 2015-02-13 NOTE — Consult Note (Signed)
[facility] Progress Note     Subjective: Pt says her abdominal pain is much improved.  No N/V now.  Sleepy this am.  She's happy her GB US is negative, but frustrated about what may be causing this.  Having flatus, no BM yet.  Still on precautions secondary to diarrhea, but hasn't had any to test.  Objective: Vital signs in last 24 hours: Temp:  [97.7 F (36.5 C)-98.8 F (37.1 C)] 98.3 F (36.8 C) (08/05 0400) Pulse Rate:  [74-134] 74 (08/05 0600) Resp:  [5-34] 14 (08/05 0600) BP: (113-186)/(56-101) 145/76 mmHg (08/05 0600) SpO2:  [100 %] 100 % (08/05 0028) Weight:  [86.728 kg (191 lb 3.2 oz)] 86.728 kg (191 lb 3.2 oz) (08/05 0500) Last BM Date: 02/09/15  Intake/Output from previous day: 08/04 0701 - 08/05 0700 In: 1300 [I.V.:1200; IV Piggyback:100] Out: 725 [Urine:725] Intake/Output this shift:    PE: Gen:  Alert, NAD, pleasant Abd: Soft, NT/ND, +BS, no HSM   Lab Results:   Recent Labs  02/12/15 0241 02/13/15 0500  WBC 7.1 7.5  HGB 13.2 12.6  HCT 39.0 37.8  PLT 342 334   BMET  Recent Labs  02/12/15 0242 02/13/15 0500  NA 132* 133*  K 3.9 3.7  CL 98* 101  CO2 23 24  GLUCOSE 89 83  BUN 6 14  CREATININE 0.90 0.97  CALCIUM 9.2 9.1   PT/INR No results for input(s): LABPROT, INR in the last 72 hours. CMP     Component Value Date/Time   NA 133* 02/13/2015 0500   K 3.7 02/13/2015 0500   CL 101 02/13/2015 0500   CO2 24 02/13/2015 0500   GLUCOSE 83 02/13/2015 0500   BUN 14 02/13/2015 0500   CREATININE 0.97 02/13/2015 0500   CREATININE 1.07 12/03/2014 1642   CALCIUM 9.1 02/13/2015 0500   PROT 7.0 02/13/2015 0500   ALBUMIN 3.4* 02/13/2015 0500   AST 69* 02/13/2015 0500   ALT 16 02/13/2015 0500   ALKPHOS 36* 02/13/2015 0500   BILITOT 1.0 02/13/2015 0500   GFRNONAA >60 02/13/2015 0500   GFRAA >60 02/13/2015 0500   Lipase     Component Value Date/Time   LIPASE 16* 02/13/2015 0500       Studies/Results: US Abdomen Limited  Ruq  02/12/2015   CLINICAL DATA:  44 year old female with upper abdominal pain for the past 4 days with nausea and vomiting.  EXAM: US ABDOMEN LIMITED - RIGHT UPPER QUADRANT  COMPARISON:  CT the abdomen and pelvis 02/09/2015.  FINDINGS: Gallbladder:  No gallstones or wall thickening visualized. No sonographic Murphy sign noted.  Common bile duct:  Diameter: 2.2 mm  Liver:  No focal lesion identified. Within normal limits in parenchymal echogenicity.  IMPRESSION: 1. No gallstones or findings to suggest an acute cholecystitis at this time.   Electronically Signed   By: Vinnie Langton M.D.   On: 02/12/2015 20:11    Anti-infectives: Anti-infectives    Start     Dose/Rate Route Frequency Ordered Stop   02/10/15 0200  vancomycin (VANCOCIN) IVPB 1000 mg/200 mL premix  Status:  Discontinued     1,000 mg 200 mL/hr over 60 Minutes Intravenous Every 12 hours 02/09/15 1516 02/11/15 1029   02/09/15 2000  piperacillin-tazobactam (ZOSYN) IVPB 3.375 g     3.375 g 12.5 mL/hr over 240 Minutes Intravenous Every 8 hours 02/09/15 1516     02/09/15 1515  piperacillin-tazobactam (ZOSYN) IVPB 3.375 g  Status:  Discontinued     3.375 g  100 mL/hr over 30 Minutes Intravenous  Once 02/09/15 1510 02/09/15 1514   02/09/15 1515  vancomycin (VANCOCIN) IVPB 1000 mg/200 mL premix  Status:  Discontinued     1,000 mg 200 mL/hr over 60 Minutes Intravenous  Once 02/09/15 1510 02/09/15 1514   02/09/15 1315  vancomycin (VANCOCIN) IVPB 1000 mg/200 mL premix     1,000 mg 200 mL/hr over 60 Minutes Intravenous  Once 02/09/15 1314 02/09/15 1538   02/09/15 1315  piperacillin-tazobactam (ZOSYN) IVPB 3.375 g     3.375 g 100 mL/hr over 30 Minutes Intravenous  Once 02/09/15 1314 02/09/15 1441       Assessment/Plan RUQ/epigastric abdominal pain -CBC and LFT's are norma.  [place] is completely negative - no gallstones.  Given present imaging and lab work and current cardiac issues would not recommend proceeding with surgical intervention.    -Recommend GI consult, consider EGD to check for PUD given history.  Called Dr. Cristina Gong who will see her. -IVF, pain control, antiemetics, antibiotics (Day #5 Zosyn) -SCD's and heparin for DVT proph -Ambulate and IS -May need to stay NPO for EGD  NSTEMI - Cardiology pending cath today.  No plans for surgery from [place].    LOS: 4 days    Nat Christen 02/13/2015, 7:23 AM Pager: [phone]

## 2015-02-13 NOTE — Progress Notes (Signed)
Patient Name: Shawny Borkowski Date of Encounter: 02/13/2015  Primary Cardiologist: Dr. Acie Fredrickson   Principal Problem:   Severe sepsis Active Problems:   Hypertensive urgency   Lactic acidosis   Abdominal pain, vomiting, and diarrhea   NSTEMI (non-ST elevated myocardial infarction)   Abscess   Substance abuse   Pain in the chest   Abdominal pain   Lung abscess   Empyema lung    SUBJECTIVE  No significant pain. SOB improved. No BM for 2 days.  Gallbladder u/s normal.  CURRENT MEDS . sodium chloride   Intravenous Once  . acetaminophen  650 mg Oral Once  . amLODipine  10 mg Oral Daily  . aspirin  81 mg Oral Pre-Cath  . [START ON 02/17/2015] cloNIDine  0.3 mg Transdermal Weekly  . heparin subcutaneous  5,000 Units Subcutaneous 3 times per day  . hydrALAZINE  20 mg Intravenous Q6H  . labetalol  20 mg Intravenous Q4H  . piperacillin-tazobactam (ZOSYN)  IV  3.375 g Intravenous Q8H  . sodium chloride  10-40 mL Intracatheter Q12H  . sodium chloride  3 mL Intravenous Q12H  . sodium chloride  3 mL Intravenous Q12H    OBJECTIVE  Filed Vitals:   02/13/15 0500 02/13/15 0516 02/13/15 0600 02/13/15 0751  BP: 121/60 121/60 145/76 147/76  Pulse: 89  74 84  Temp:    97.5 F (36.4 C)  TempSrc:    Oral  Resp: 15  14 12   Height:      Weight: 191 lb 3.2 oz (86.728 kg)     SpO2:    98%    Intake/Output Summary (Last 24 hours) at 02/13/15 0827 Last data filed at 02/13/15 0600  Gross per 24 hour  Intake   1300 ml  Output    725 ml  Net    575 ml   Filed Weights   02/11/15 0500 02/12/15 0500 02/13/15 0500  Weight: 196 lb 8 oz (89.132 kg) 192 lb 11.2 oz (87.408 kg) 191 lb 3.2 oz (86.728 kg)    PHYSICAL EXAM  General: Pleasant, NAD. Neuro: Alert and oriented X 3. Moves all extremities spontaneously. Psych: Normal affect. HEENT:  Normal  Neck: Supple without bruits or JVD. Lungs:  Resp regular and unlabored, CTA. Heart: RRR no s3, s4, or murmurs. Abdomen: Soft, non-tender,  non-distended, BS + x 4.  Extremities: No clubbing, cyanosis or edema. DP/PT/Radials 2+ and equal bilaterally.  Accessory Clinical Findings  CBC  Recent Labs  02/11/15 0710 02/12/15 0241 02/13/15 0500  WBC 10.0 7.1 7.5  NEUTROABS 6.2  --   --   HGB 12.3 13.2 12.6  HCT 37.2 39.0 37.8  MCV 82.1 80.1 80.3  PLT 354 342 960   Basic Metabolic Panel  Recent Labs  02/11/15 0710 02/12/15 0242 02/13/15 0500  NA 134* 132* 133*  K 4.3 3.9 3.7  CL 104 98* 101  CO2 22 23 24   GLUCOSE 130* 89 83  BUN <5* 6 14  CREATININE 0.88 0.90 0.97  CALCIUM 8.8* 9.2 9.1  MG 1.9  --   --    Liver Function Tests  Recent Labs  02/12/15 0242 02/13/15 0500  AST 38 69*  ALT 15 16  ALKPHOS 42 36*  BILITOT 0.8 1.0  PROT 7.3 7.0  ALBUMIN 3.5 3.4*    Recent Labs  02/13/15 0500  LIPASE 16*   Cardiac Enzymes  Recent Labs  02/11/15 0053 02/11/15 0710 02/12/15 0242  TROPONINI 1.71* 1.74* 1.75*    TELE  NSR with HR 80s    ECG  No new EKG  Echocardiogram 03/02/2015  LV EF: 40% -  45%  ------------------------------------------------------------------- Indications:   Chest pain 786.51.  ------------------------------------------------------------------- History:  PMH: NSTEMI. Hy[ertensive urgency. Severe sepsis. Lactic acidosis.  ------------------------------------------------------------------- Study Conclusions  - Left ventricle: The cavity size was normal. Wall thickness was increased in a pattern of moderate LVH. Systolic function was mildly to moderately reduced. The estimated ejection fraction was in the range of 40% to 45%. Akinesis of the apical myocardium. Features are consistent with a pseudonormal left ventricular filling pattern, with concomitant abnormal relaxation and increased filling pressure (grade 2 diastolic dysfunction). - Mitral valve: There was mild regurgitation. - Left atrium: The atrium was mildly dilated. - Pericardium,  extracardiac: A trivial pericardial effusion was identified.    Radiology/Studies  US Transvaginal Non-ob  03-02-2015   CLINICAL DATA:  Right adnexal cystic lesion with associated intraperitoneal free fluid.  EXAM: TRANSABDOMINAL AND TRANSVAGINAL ULTRASOUND OF PELVIS  TECHNIQUE: Both transabdominal and transvaginal ultrasound examinations of the pelvis were performed. Transabdominal technique was performed for global imaging of the pelvis including uterus, ovaries, adnexal regions, and pelvic cul-de-sac. It was necessary to proceed with endovaginal exam following the transabdominal exam to visualize the right adnexa.  COMPARISON:  CT of the abdomen pelvis 02/09/2015  FINDINGS: Uterus  Measurements: 9.0 by 5.7 by 5.9 cm. There is a posterior fundal intramural circumscribed soft tissue mass with echogenicity similar to the myometrium likely representing a leiomyoma, which measures 4.2 by 3.3 by 3.7 cm.  Endometrium  Thickness: 6.3 mm.  No focal abnormality visualized.  Right ovary  Measurements: 5.8 x 3.5 x 4.5 cm. It contains 2 thick-walled loculated cystic structures demonstrating intramural low level echogenicity. The larger of these measures 3.2 x 3.2 x 3.7 cm, and the smaller one measures 2.3 x 2.3 x 2.3 cm. No internal blood flow is seen.  Left ovary  Measurements: 2.4 x 1.9 by 2.2. Normal appearance/no adnexal mass.  Other findings  Moderate amount of free fluid is present in the cul-de-sac and within the right adnexa.  IMPRESSION: Thick wall multiloculated cystic structure within the right adnexa. Differential diagnosis includes hemorrhagic cysts, or tubo-ovarian abscess. Ovarian torsion and solid hemorrhagic right adnexal mass are felt less likely, although theoretically possible.  Moderate amount of free fluid in the pelvis.  4.2 cm posterior fundal uterine leiomyoma.   Electronically Signed   By: Fidela Salisbury M.D.   On: 03-02-2015 14:27   US Pelvis Complete  03-02-2015   CLINICAL DATA:   Right adnexal cystic lesion with associated intraperitoneal free fluid.  EXAM: TRANSABDOMINAL AND TRANSVAGINAL ULTRASOUND OF PELVIS  TECHNIQUE: Both transabdominal and transvaginal ultrasound examinations of the pelvis were performed. Transabdominal technique was performed for global imaging of the pelvis including uterus, ovaries, adnexal regions, and pelvic cul-de-sac. It was necessary to proceed with endovaginal exam following the transabdominal exam to visualize the right adnexa.  COMPARISON:  CT of the abdomen pelvis 02/09/2015  FINDINGS: Uterus  Measurements: 9.0 by 5.7 by 5.9 cm. There is a posterior fundal intramural circumscribed soft tissue mass with echogenicity similar to the myometrium likely representing a leiomyoma, which measures 4.2 by 3.3 by 3.7 cm.  Endometrium  Thickness: 6.3 mm.  No focal abnormality visualized.  Right ovary  Measurements: 5.8 x 3.5 x 4.5 cm. It contains 2 thick-walled loculated cystic structures demonstrating intramural low level echogenicity. The larger of these measures 3.2 x 3.2 x 3.7 cm, and the smaller one  measures 2.3 x 2.3 x 2.3 cm. No internal blood flow is seen.  Left ovary  Measurements: 2.4 x 1.9 by 2.2. Normal appearance/no adnexal mass.  Other findings  Moderate amount of free fluid is present in the cul-de-sac and within the right adnexa.  IMPRESSION: Thick wall multiloculated cystic structure within the right adnexa. Differential diagnosis includes hemorrhagic cysts, or tubo-ovarian abscess. Ovarian torsion and solid hemorrhagic right adnexal mass are felt less likely, although theoretically possible.  Moderate amount of free fluid in the pelvis.  4.2 cm posterior fundal uterine leiomyoma.   Electronically Signed   By: Fidela Salisbury M.D.   On: 02/10/2015 14:27   Ct Abdomen Pelvis W Contrast  02/09/2015   CLINICAL DATA:  Initial encounter for left upper quadrant abdominal pain with vomiting and diarrhea.  EXAM: CT ABDOMEN AND PELVIS WITH CONTRAST   TECHNIQUE: Multidetector CT imaging of the abdomen and pelvis was performed using the standard protocol following bolus administration of intravenous contrast.  CONTRAST:  189mL OMNIPAQUE IOHEXOL 300 MG/ML  SOLN  COMPARISON:  01/30/2015.  FINDINGS: Lower chest:  Probable atelectasis in the lingula.  Hepatobiliary: No focal abnormality within the liver parenchyma. Tiny layering gallstones are noted in the dependent gallbladder (see image 62 of series 6). No evidence for gallbladder wall thickening or pericholecystic fluid. No intrahepatic or extrahepatic biliary dilation.  Pancreas: No focal mass lesion. No dilatation of the main duct. No intraparenchymal cyst. No peripancreatic edema.  Spleen: No splenomegaly.  No focal mass lesion.  Adrenals/Urinary Tract: No adrenal nodule or mass. The patient has at least 5 nonobstructing stones in the right kidney, the largest measuring about 3 mm. Three stones are seen in the left kidney without obstruction, all measuring approximately 4-5 mm. No enhancing lesion is seen in either kidney. There is no evidence for hydroureter. No ureteral or bladder stone.  Stomach/Bowel: Stomach is nondistended. No gastric wall thickening. No evidence of outlet obstruction. Duodenum is normally positioned as is the ligament of Treitz. No small bowel wall thickening. No small bowel dilatation. The terminal ileum is normal. The appendix is normal. Diverticular are seen in the region of the splenic flexure without diverticulitis of the colon.  Vascular/Lymphatic: There is abdominal aortic atherosclerosis without aneurysm. There is no gastrohepatic or hepatoduodenal ligament lymphadenopathy. No intraperitoneal or retroperitoneal lymphadenopy. No mesenteric or pelvic sidewall lymphadenopathy.  Reproductive: Uterus is unremarkable. 3.8 cm cystic lesion identified in the right adnexal space. Small to moderate volume intraperitoneal free fluid is evident. This is similar in appearance to the previous  study.  Other: Gas within the subcutaneous fat of the lower anterior right abdominal wall likely related to an injection site.  Musculoskeletal: Bone windows reveal no worrisome lytic or sclerotic osseous lesions.  IMPRESSION: 1. 3.8 cm cystic lesion in the right adnexal space with moderate volume intraperitoneal free fluid. This is more fluid than typically seen for cyst rupture. Pelvic ultrasound may prove helpful to further evaluate, as clinically warranted. 2. Tiny layering calcified stones in the gallbladder lumen appear to be new in the interval since the prior study. 3. Multiple bilateral nonobstructing renal stones.   Electronically Signed   By: Misty Stanley M.D.   On: 02/09/2015 19:38   Ct Abdomen Pelvis W Contrast  01/30/2015   CLINICAL DATA:  Abdominal pain with nausea and vomiting beginning at 1:00 a.m. 01/30/2015.  EXAM: CT ABDOMEN AND PELVIS WITH CONTRAST  TECHNIQUE: Multidetector CT imaging of the abdomen and pelvis was performed using the standard protocol  following bolus administration of intravenous contrast.  CONTRAST:  100 mL OMNIPAQUE IOHEXOL 300 MG/ML  SOLN  COMPARISON:  CT abdomen and pelvis 10/14/2013.  FINDINGS: Lung bases are clear. No pleural or pericardial effusion. Cardiomegaly noted.  Multiple small nonobstructing renal stones are seen bilaterally. There is no hydronephrosis on the right or left and no ureteral stones identified. The liver is low attenuating consistent with fatty infiltration. No focal liver lesion is identified. The gallbladder, adrenal glands, spleen, pancreas and biliary tree all appear normal.  There is a small volume of free pelvic fluid which is somewhat greater than typically seen in physiologic change. No focal fluid collection is identified. There is no lymphadenopathy. The patient is status post tubal ligation. The stomach, small and large bowel and appendix all appear normal. No lytic or sclerotic bony lesion is identified.  IMPRESSION: Small volume of  free pelvic fluid is slightly greater than typically seen in physiologic change. Although the bowel appears normal this finding could be due to enteritis.  Fatty infiltration of the liver.  Multiple bilateral nonobstructing renal stones.  Mild cardiomegaly.   Electronically Signed   By: Inge Rise M.D.   On: 01/30/2015 13:44   Dg Chest Portable 1 View  02/09/2015   CLINICAL DATA:  Kidney a weakness shortness of breath  EXAM: PORTABLE CHEST - 1 VIEW  COMPARISON:  01/30/2015  FINDINGS: Heart size within normal limits allowing for rotation and lordotic positioning. Vascular pattern normal. Lungs clear. Persistent minimal left costophrenic angle blunting.  IMPRESSION: Stable mild left blunting of the costophrenic angle suggesting pleural thickening or mild effusion.   Electronically Signed   By: Skipper Cliche M.D.   On: 02/09/2015 13:52   Dg Abd Acute W/chest  01/30/2015   CLINICAL DATA:  Acute generalized abdominal pain, vomiting.  EXAM: DG ABDOMEN ACUTE W/ 1V CHEST  COMPARISON:  Dec 09, 2014.  FINDINGS: There is no evidence of dilated bowel loops or free intraperitoneal air. Stable bilateral nephrolithiasis. Heart size and mediastinal contours are within normal limits. Stable left basilar scarring is noted. No acute pulmonary disease is noted.  IMPRESSION: No evidence of bowel obstruction or ileus. Stable bilateral nephrolithiasis. No acute cardiopulmonary disease.   Electronically Signed   By: Marijo Conception, M.D.   On: 01/30/2015 11:34   US Abdomen Limited Ruq  02/12/2015   CLINICAL DATA:  44 year old female with upper abdominal pain for the past 4 days with nausea and vomiting.  EXAM: US ABDOMEN LIMITED - RIGHT UPPER QUADRANT  COMPARISON:  CT the abdomen and pelvis 02/09/2015.  FINDINGS: Gallbladder:  No gallstones or wall thickening visualized. No sonographic Murphy sign noted.  Common bile duct:  Diameter: 2.2 mm  Liver:  No focal lesion identified. Within normal limits in parenchymal  echogenicity.  IMPRESSION: 1. No gallstones or findings to suggest an acute cholecystitis at this time.   Electronically Signed   By: Vinnie Langton M.D.   On: 02/12/2015 20:11    ASSESSMENT AND PLAN  Patient is a 44 y.o. female with a PMHx of hypertension, empyema-status post decortication who was admitted to [facility] on 02/09/2015 for evaluation of gastroenteritis and sepsis syndrome. She was found have a mildly positive troponin level and we were consulted for further evaluation.  1. Severe sepsis and GI discomfort - resolved. Minimal GI discomfort now. No BM 2 days, may be able to come off of enteric precautions - per primary team.   2. Elevated trop with new TWI in anterolateral  leads  - trop 1.7  - Echo 11/19/2014 EF 50-55%, small free flowing pericardial effusion  - Echo 02/10/2015 EF 40-45%, akinesis of apical myocardium, grade 2 diastolic dysfunction, moderate LVH, mild MR  - could be takotsubo's cardiomyopathy with recent sepsis, however will need cardiac cath to definitively assess.  - plan cath today - discussed risks and benefits (stroke, MI, Death, bleeding). If PCI needed, will be thoughtful given possible future surgical needs. Would like surgery to weigh in on this possibility. May not require since gall bladder u/s normal.   3. HTN - switch to PO when able.  4. Empyema s/p decortication 5. 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 Plan for outpatient workup  Signed, Candee Furbish MD

## 2015-02-14 ENCOUNTER — Inpatient Hospital Stay (HOSPITAL_COMMUNITY): Payer: Medicaid Other

## 2015-02-14 ENCOUNTER — Encounter (HOSPITAL_COMMUNITY): Payer: Self-pay | Admitting: Radiology

## 2015-02-14 DIAGNOSIS — R7989 Other specified abnormal findings of blood chemistry: Secondary | ICD-10-CM

## 2015-02-14 DIAGNOSIS — R778 Other specified abnormalities of plasma proteins: Secondary | ICD-10-CM | POA: Diagnosis present

## 2015-02-14 LAB — COMPREHENSIVE METABOLIC PANEL
ALT: 14 U/L (ref 14–54)
AST: 60 U/L — AB (ref 15–41)
Albumin: 3.1 g/dL — ABNORMAL LOW (ref 3.5–5.0)
Alkaline Phosphatase: 32 U/L — ABNORMAL LOW (ref 38–126)
Anion gap: 7 (ref 5–15)
BILIRUBIN TOTAL: 0.7 mg/dL (ref 0.3–1.2)
BUN: 13 mg/dL (ref 6–20)
CHLORIDE: 103 mmol/L (ref 101–111)
CO2: 27 mmol/L (ref 22–32)
CREATININE: 0.97 mg/dL (ref 0.44–1.00)
Calcium: 8.9 mg/dL (ref 8.9–10.3)
GLUCOSE: 92 mg/dL (ref 65–99)
POTASSIUM: 3.5 mmol/L (ref 3.5–5.1)
SODIUM: 137 mmol/L (ref 135–145)
Total Protein: 6 g/dL — ABNORMAL LOW (ref 6.5–8.1)

## 2015-02-14 LAB — CULTURE, BLOOD (ROUTINE X 2)
Culture: NO GROWTH
Culture: NO GROWTH

## 2015-02-14 LAB — CBC
HCT: 34.7 % — ABNORMAL LOW (ref 36.0–46.0)
Hemoglobin: 11.6 g/dL — ABNORMAL LOW (ref 12.0–15.0)
MCH: 27.1 pg (ref 26.0–34.0)
MCHC: 33.4 g/dL (ref 30.0–36.0)
MCV: 81.1 fL (ref 78.0–100.0)
Platelets: 293 10*3/uL (ref 150–400)
RBC: 4.28 MIL/uL (ref 3.87–5.11)
RDW: 17.3 % — AB (ref 11.5–15.5)
WBC: 5.9 10*3/uL (ref 4.0–10.5)

## 2015-02-14 LAB — D-DIMER, QUANTITATIVE (NOT AT ARMC): D DIMER QUANT: 1.25 ug{FEU}/mL — AB (ref 0.00–0.48)

## 2015-02-14 MED ORDER — CLONIDINE HCL 0.2 MG PO TABS
0.2000 mg | ORAL_TABLET | Freq: Three times a day (TID) | ORAL | Status: DC
  Administered 2015-02-14 – 2015-02-15 (×5): 0.2 mg via ORAL
  Filled 2015-02-14 (×6): qty 1

## 2015-02-14 MED ORDER — OXYCODONE HCL 5 MG PO TABS
5.0000 mg | ORAL_TABLET | Freq: Four times a day (QID) | ORAL | Status: DC | PRN
  Administered 2015-02-14: 5 mg via ORAL
  Filled 2015-02-14: qty 1

## 2015-02-14 MED ORDER — SACCHAROMYCES BOULARDII 250 MG PO CAPS
250.0000 mg | ORAL_CAPSULE | Freq: Two times a day (BID) | ORAL | Status: DC
  Administered 2015-02-14 – 2015-02-15 (×2): 250 mg via ORAL
  Filled 2015-02-14 (×3): qty 1

## 2015-02-14 MED ORDER — LABETALOL HCL 100 MG PO TABS
100.0000 mg | ORAL_TABLET | Freq: Two times a day (BID) | ORAL | Status: DC
  Administered 2015-02-14 – 2015-02-15 (×3): 100 mg via ORAL
  Filled 2015-02-14 (×4): qty 1

## 2015-02-14 MED ORDER — PANTOPRAZOLE SODIUM 40 MG PO TBEC: 40.0000 mg | DELAYED_RELEASE_TABLET | Freq: Every day | ORAL | Status: DC

## 2015-02-14 MED ORDER — HYDRALAZINE HCL 50 MG PO TABS
50.0000 mg | ORAL_TABLET | Freq: Three times a day (TID) | ORAL | Status: DC
  Administered 2015-02-14 – 2015-02-15 (×4): 50 mg via ORAL
  Filled 2015-02-14 (×6): qty 1

## 2015-02-14 MED ORDER — TRAMADOL HCL 50 MG PO TABS: 50.0000 mg | ORAL_TABLET | Freq: Four times a day (QID) | ORAL | Status: DC | PRN

## 2015-02-14 MED ORDER — IOHEXOL 350 MG/ML SOLN
80.0000 mL | Freq: Once | INTRAVENOUS | Status: AC | PRN
  Administered 2015-02-14: 80 mL via INTRAVENOUS

## 2015-02-14 MED ORDER — METRONIDAZOLE 500 MG PO TABS
500.0000 mg | ORAL_TABLET | Freq: Three times a day (TID) | ORAL | Status: DC
  Administered 2015-02-14 – 2015-02-15 (×3): 500 mg via ORAL
  Filled 2015-02-14 (×6): qty 1

## 2015-02-14 MED ORDER — PANTOPRAZOLE SODIUM 40 MG PO TBEC
40.0000 mg | DELAYED_RELEASE_TABLET | Freq: Two times a day (BID) | ORAL | Status: DC
Start: 2015-02-14 — End: 2015-02-15
  Administered 2015-02-14 – 2015-02-15 (×2): 40 mg via ORAL
  Filled 2015-02-14 (×2): qty 1

## 2015-02-14 MED ORDER — DOXYCYCLINE HYCLATE 100 MG PO TABS
100.0000 mg | ORAL_TABLET | Freq: Two times a day (BID) | ORAL | Status: DC
  Administered 2015-02-14 – 2015-02-15 (×2): 100 mg via ORAL
  Filled 2015-02-14 (×3): qty 1

## 2015-02-14 NOTE — Progress Notes (Signed)
[place] TEAM 1 - Stepdown/ICU TEAM Progress Note  Emily Key [id number] DOB: February 26, 1971 DOA: 02/09/2015 PCP: [name], MD  Admit HPI / Brief Narrative: 44 y.o. F Hx Hypertension, peripartum CHF, and empyema + lung abscess LLL 11/2014 S/P left VATS who was admitted from 7/22-7/27 with presumed enteritis. Her GI pathogen panel during that admission was completely negative. She was discharged on 7 days of Cipro/Flagyl. She went home and was doing well. She was almost finished with her antibiotic therapy and eating solid food. She stated her bowel movements had not completely normalized but were better. Suddenly on 7/31 close to midnight her abdominal pain returned and she began to vomit and had dark diarrhea. She reported a minimum of 5 diarrhea/vomiting episodes.   In the ED her blood pressure was 216/144, temperature 100.3, pulse rate 139, respirations 42, white count 14.9 hemoglobin 16.6, platelets 437.  HPI/Subjective: At the time of my visit the patient complains of new central to left-sided chest pain with the description that is somewhat pleuritic in nature.  She denies abdominal pain nausea or vomiting.  She denies shortness of breath.  Assessment/Plan:  Severe sepsis with lactic acidosis - idiopathic No specific confirmed source of infection has been identified - sepsis physiology has resolved  Pleuritic chest pain Check d-dimer - if this is elevated we'll obtain CT angios chest to rule out large central PE  Abdom pain w/ vomiting and intermittent diarrhea  there was concern this was her GB, but GB US was unrevealing w/ no evidence of gallstones - GI consulted by Gen Surg and has suggested empiric PPI therapy and is evaluating for possible obstipation  ?Tubo-ovarian abscess GYN has evaluated and does not feel she has a true Tubo-ovarian abscess and instead feels this represents a right hydrosalpinx as well as cyst on the right ovary - no further evaluation planned as  inpatient - GYN suggest doxy and flagyl to complete a full empiric tx course w/ short interval f/u in the [facility] for eventual addressing of the hydrosalpinx  Mild troponin elevation - T wave inversions anterolateral Cards has evaluated - troponin peaked at  1.75 - cardiac cath noted normal coronaries   Hypertensive urgency BP control much improved - follow trend without change in treatment plan today  Recent Left lung abscess/empyema -S/P VATS 11/2014 - no apparent complications at the present time  Code Status: FULL Family Communication: no family present at time of exam Disposition Plan: Continue to monitor on telemetry bed  Consultants: Cardiology GYN Gen Surgery  [facility]  Procedures: 8/2 - TTE - EF 40-45% - akinesis of apex - grade 2 DD / mod LVH  8/5 - cardiac cath - no appreciable coronary artery disease   Antibiotics: Zosyn 8/1 > 8/6 Vancomycin 8/1 > 8/2 Doxycycline 8/6 > Flagyl 8/6 >  DVT prophylaxis: Subcutaneous heparin  Objective: Blood pressure 160/74, pulse 78, temperature 98.2 F (36.8 C), temperature source Oral, resp. rate 18, height 5\' 8"  (1.727 m), weight 86.9 kg (191 lb 9.3 oz), last menstrual period 12/31/2014, SpO2 100 %.  Intake/Output Summary (Last 24 hours) at 02/14/15 0933 Last data filed at 02/13/15 1705  Gross per 24 hour  Intake    800 ml  Output      0 ml  Net    800 ml   Exam: General: No acute respiratory distress Lungs: Clear to auscultation bilaterally without wheezes or crackles Cardiovascular: Regular rate and rhythm without murmur gallop rub  Abdomen: nontender,  nondistended, no appreciable mass, bowel sounds positive, no rebound  Extremities: No significant cyanosis, clubbing, or edema bilateral lower extremities   Data Reviewed: Basic Metabolic Panel:  Recent Labs Lab 02/09/15 1945 02/10/15 0657 02/11/15 0710 02/12/15 0242 02/13/15 0500 02/14/15 0435  NA  --  136 134* 132* 133* 137  K  --  3.8  4.3 3.9 3.7 3.5  CL  --  104 104 98* 101 103  CO2  --  22 22 23 24 27   GLUCOSE  --  101* 130* 89 83 92  BUN  --  7 <5* 6 14 13   CREATININE  --  0.98 0.88 0.90 0.97 0.97  CALCIUM  --  8.4* 8.8* 9.2 9.1 8.9  MG 1.6* 2.3 1.9  --   --   --    Liver Function Tests:  Recent Labs Lab 02/10/15 0657 02/11/15 0710 02/12/15 0242 02/13/15 0500 02/14/15 0435  AST 34 33 38 69* 60*  ALT 15 15 15 16 14   ALKPHOS 46 42 42 36* 32*  BILITOT 0.7 0.9 0.8 1.0 0.7  PROT 6.7 6.6 7.3 7.0 6.0*  ALBUMIN 3.2* 3.3* 3.5 3.4* 3.1*    Recent Labs Lab 02/09/15 1241 02/13/15 0500  LIPASE 12* 16*   CBC:  Recent Labs Lab 02/10/15 0657 02/11/15 0710 02/12/15 0241 02/13/15 0500 02/14/15 0435  WBC 12.8* 10.0 7.1 7.5 5.9  NEUTROABS  --  6.2  --   --   --   HGB 12.7 12.3 13.2 12.6 11.6*  HCT 38.8 37.2 39.0 37.8 34.7*  MCV 81.0 82.1 80.1 80.3 81.1  PLT 366 354 342 334 293   Cardiac Enzymes:  Recent Labs Lab 02/10/15 0657 02/10/15 1925 02/11/15 0053 02/11/15 0710 02/12/15 0242  TROPONINI 0.77* 1.53* 1.71* 1.74* 1.75*   BNP (last 3 results)  Recent Labs  11/12/14 0355 02/09/15 1557  BNP 138.6* 3120.5*     Recent Results (from the past 240 hour(s))  Urine culture     Status: None   Collection Time: 02/09/15 12:42 PM  Result Value Ref Range Status   Specimen Description URINE, RANDOM  Final   Special Requests NONE  Final   Culture MULTIPLE SPECIES PRESENT, SUGGEST RECOLLECTION  Final   Report Status 02/10/2015 FINAL  Final  Blood culture (routine x 2)     Status: None (Preliminary result)   Collection Time: 02/09/15  1:22 PM  Result Value Ref Range Status   Specimen Description BLOOD LEFT ANTECUBITAL  Final   Special Requests BOTTLES DRAWN AEROBIC AND ANAEROBIC 5CC  Final   Culture NO GROWTH 4 DAYS  Final   Report Status PENDING  Incomplete  Blood culture (routine x 2)     Status: None (Preliminary result)   Collection Time: 02/09/15  1:54 PM  Result Value Ref Range Status    Specimen Description BLOOD LEFT FOREARM  Final   Special Requests BOTTLES DRAWN AEROBIC AND ANAEROBIC 5CC  Final   Culture NO GROWTH 4 DAYS  Final   Report Status PENDING  Incomplete  MRSA PCR Screening     Status: None   Collection Time: 02/09/15  4:56 PM  Result Value Ref Range Status   MRSA by PCR NEGATIVE NEGATIVE Final    Comment:        The GeneXpert MRSA Assay (FDA approved for NASAL specimens only), is one component of a comprehensive MRSA colonization surveillance program. It is not intended to diagnose MRSA infection nor to guide or monitor treatment for MRSA infections.  Urine culture     Status: None   Collection Time: 02/10/15  7:16 AM  Result Value Ref Range Status   Specimen Description URINE, RANDOM  Final   Special Requests NONE  Final   Culture NO GROWTH 1 DAY  Final   Report Status 02/11/2015 FINAL  Final     Studies:  Recent x-ray studies have been reviewed in detail by the Attending Physician  Scheduled Meds:  Scheduled Meds: . acetaminophen  650 mg Oral Once  . amLODipine  10 mg Oral Daily  . [START ON 02/17/2015] cloNIDine  0.3 mg Transdermal Weekly  . heparin subcutaneous  5,000 Units Subcutaneous 3 times per day  . hydrALAZINE  20 mg Intravenous Q6H  . labetalol  20 mg Intravenous Q4H  . piperacillin-tazobactam (ZOSYN)  IV  3.375 g Intravenous Q8H  . sodium chloride  3 mL Intravenous Q12H    Time spent on care of this patient: 35 mins  Cherene Altes, MD [facility] For Consults/Admissions - Flow Manager - [phone] Office  [phone]  Contact MD directly via text page:      [email or web address]      password [facility]  02/14/2015, 9:33 AM   LOS: 5 days

## 2015-02-14 NOTE — Progress Notes (Signed)
Patient still complains of some degree of epigastric discomfort, although she indicates that, unequivocally, she is improved compared to admission. The degree of improvement, however, is not too dramatic. She has only had one or 2 doses of PPI therapy so far.  On exam, the patient has a moderate amount of tympany across the epigastric area. She has not had a bowel movement for several days. There is no distention, bowel sounds are normal, there is some subjective abdominal tenderness but no guarding or peritoneal findings.  Impression: Nonspecific abdominal discomfort. This sounds functional to me.  Plan: Obtain KUB today to look for evidence of fecal obstipation and to assess for amount and location of gas within the GI tract, which might be creating some of the upper abdominal discomfort she is experiencing.  Cleotis Nipper, M.D. Pager [phone] If no answer or after 5 PM call [phone]

## 2015-02-14 NOTE — Progress Notes (Addendum)
SUBJECTIVE:  Denies chest pain   OBJECTIVE:   Vitals:   Filed Vitals:   02/13/15 2236 02/14/15 0030 02/14/15 0514 02/14/15 0800  BP: 157/79 143/89 152/76 160/74  Pulse:  76 78   Temp:   98.2 F (36.8 C)   TempSrc:   Oral   Resp:   18   Height:      Weight:   191 lb 9.3 oz (86.9 kg)   SpO2:       I&O's:   Intake/Output Summary (Last 24 hours) at 02/14/15 1025 Last data filed at 02/14/15 0900  Gross per 24 hour  Intake    720 ml  Output      0 ml  Net    720 ml   TELEMETRY: Reviewed telemetry pt in NSR:     PHYSICAL EXAM General: Well developed, well nourished, in no acute distress Head: Eyes PERRLA, No xanthomas.   Normal cephalic and atramatic  Lungs:   Clear bilaterally to auscultation and percussion. Heart:   HRRR S1 S2 Pulses are 2+ & equal. Abdomen: Bowel sounds are positive, abdomen soft and non-tender without masses  Extremities:   No clubbing, cyanosis or edema.  DP +1 Neuro: Alert and oriented X 3. Psych:  Good affect, responds appropriately   LABS: Basic Metabolic Panel:  Recent Labs  02/13/15 0500 02/14/15 0435  NA 133* 137  K 3.7 3.5  CL 101 103  CO2 24 27  GLUCOSE 83 92  BUN 14 13  CREATININE 0.97 0.97  CALCIUM 9.1 8.9   Liver Function Tests:  Recent Labs  02/13/15 0500 02/14/15 0435  AST 69* 60*  ALT 16 14  ALKPHOS 36* 32*  BILITOT 1.0 0.7  PROT 7.0 6.0*  ALBUMIN 3.4* 3.1*    Recent Labs  02/13/15 0500  LIPASE 16*   CBC:  Recent Labs  02/13/15 0500 02/14/15 0435  WBC 7.5 5.9  HGB 12.6 11.6*  HCT 37.8 34.7*  MCV 80.3 81.1  PLT 334 293   Cardiac Enzymes:  Recent Labs  02/12/15 0242  TROPONINI 1.75*   BNP: Invalid input(s): POCBNP D-Dimer: No results for input(s): DDIMER in the last 72 hours. Hemoglobin A1C: No results for input(s): HGBA1C in the last 72 hours. Fasting Lipid Panel: No results for input(s): CHOL, HDL, LDLCALC, TRIG, CHOLHDL, LDLDIRECT in the last 72 hours. Thyroid Function Tests: No  results for input(s): TSH, T4TOTAL, T3FREE, THYROIDAB in the last 72 hours.  Invalid input(s): FREET3 Anemia Panel: No results for input(s): VITAMINB12, FOLATE, FERRITIN, TIBC, IRON, RETICCTPCT in the last 72 hours. Coag Panel:   Lab Results  Component Value Date   INR 1.09 02/09/2015   INR 1.21 11/13/2014   INR 1.06 12/31/2009    RADIOLOGY: US Transvaginal Non-ob  02/10/2015   CLINICAL DATA:  Right adnexal cystic lesion with associated intraperitoneal free fluid.  EXAM: TRANSABDOMINAL AND TRANSVAGINAL ULTRASOUND OF PELVIS  TECHNIQUE: Both transabdominal and transvaginal ultrasound examinations of the pelvis were performed. Transabdominal technique was performed for global imaging of the pelvis including uterus, ovaries, adnexal regions, and pelvic cul-de-sac. It was necessary to proceed with endovaginal exam following the transabdominal exam to visualize the right adnexa.  COMPARISON:  CT of the abdomen pelvis 02/09/2015  FINDINGS: Uterus  Measurements: 9.0 by 5.7 by 5.9 cm. There is a posterior fundal intramural circumscribed soft tissue mass with echogenicity similar to the myometrium likely representing a leiomyoma, which measures 4.2 by 3.3 by 3.7 cm.  Endometrium  Thickness: 6.3 mm.  No focal abnormality visualized.  Right ovary  Measurements: 5.8 x 3.5 x 4.5 cm. It contains 2 thick-walled loculated cystic structures demonstrating intramural low level echogenicity. The larger of these measures 3.2 x 3.2 x 3.7 cm, and the smaller one measures 2.3 x 2.3 x 2.3 cm. No internal blood flow is seen.  Left ovary  Measurements: 2.4 x 1.9 by 2.2. Normal appearance/no adnexal mass.  Other findings  Moderate amount of free fluid is present in the cul-de-sac and within the right adnexa.  IMPRESSION: Thick wall multiloculated cystic structure within the right adnexa. Differential diagnosis includes hemorrhagic cysts, or tubo-ovarian abscess. Ovarian torsion and solid hemorrhagic right adnexal mass are felt less  likely, although theoretically possible.  Moderate amount of free fluid in the pelvis.  4.2 cm posterior fundal uterine leiomyoma.   Electronically Signed   By: Fidela Salisbury M.D.   On: 02/10/2015 14:27   US Pelvis Complete  02/10/2015   CLINICAL DATA:  Right adnexal cystic lesion with associated intraperitoneal free fluid.  EXAM: TRANSABDOMINAL AND TRANSVAGINAL ULTRASOUND OF PELVIS  TECHNIQUE: Both transabdominal and transvaginal ultrasound examinations of the pelvis were performed. Transabdominal technique was performed for global imaging of the pelvis including uterus, ovaries, adnexal regions, and pelvic cul-de-sac. It was necessary to proceed with endovaginal exam following the transabdominal exam to visualize the right adnexa.  COMPARISON:  CT of the abdomen pelvis 02/09/2015  FINDINGS: Uterus  Measurements: 9.0 by 5.7 by 5.9 cm. There is a posterior fundal intramural circumscribed soft tissue mass with echogenicity similar to the myometrium likely representing a leiomyoma, which measures 4.2 by 3.3 by 3.7 cm.  Endometrium  Thickness: 6.3 mm.  No focal abnormality visualized.  Right ovary  Measurements: 5.8 x 3.5 x 4.5 cm. It contains 2 thick-walled loculated cystic structures demonstrating intramural low level echogenicity. The larger of these measures 3.2 x 3.2 x 3.7 cm, and the smaller one measures 2.3 x 2.3 x 2.3 cm. No internal blood flow is seen.  Left ovary  Measurements: 2.4 x 1.9 by 2.2. Normal appearance/no adnexal mass.  Other findings  Moderate amount of free fluid is present in the cul-de-sac and within the right adnexa.  IMPRESSION: Thick wall multiloculated cystic structure within the right adnexa. Differential diagnosis includes hemorrhagic cysts, or tubo-ovarian abscess. Ovarian torsion and solid hemorrhagic right adnexal mass are felt less likely, although theoretically possible.  Moderate amount of free fluid in the pelvis.  4.2 cm posterior fundal uterine leiomyoma.   Electronically  Signed   By: Fidela Salisbury M.D.   On: 02/10/2015 14:27   Ct Abdomen Pelvis W Contrast  02/09/2015   CLINICAL DATA:  Initial encounter for left upper quadrant abdominal pain with vomiting and diarrhea.  EXAM: CT ABDOMEN AND PELVIS WITH CONTRAST  TECHNIQUE: Multidetector CT imaging of the abdomen and pelvis was performed using the standard protocol following bolus administration of intravenous contrast.  CONTRAST:  166mL OMNIPAQUE IOHEXOL 300 MG/ML  SOLN  COMPARISON:  01/30/2015.  FINDINGS: Lower chest:  Probable atelectasis in the lingula.  Hepatobiliary: No focal abnormality within the liver parenchyma. Tiny layering gallstones are noted in the dependent gallbladder (see image 62 of series 6). No evidence for gallbladder wall thickening or pericholecystic fluid. No intrahepatic or extrahepatic biliary dilation.  Pancreas: No focal mass lesion. No dilatation of the main duct. No intraparenchymal cyst. No peripancreatic edema.  Spleen: No splenomegaly.  No focal mass lesion.  Adrenals/Urinary Tract: No adrenal nodule or mass. The patient has at  least 5 nonobstructing stones in the right kidney, the largest measuring about 3 mm. Three stones are seen in the left kidney without obstruction, all measuring approximately 4-5 mm. No enhancing lesion is seen in either kidney. There is no evidence for hydroureter. No ureteral or bladder stone.  Stomach/Bowel: Stomach is nondistended. No gastric wall thickening. No evidence of outlet obstruction. Duodenum is normally positioned as is the ligament of Treitz. No small bowel wall thickening. No small bowel dilatation. The terminal ileum is normal. The appendix is normal. Diverticular are seen in the region of the splenic flexure without diverticulitis of the colon.  Vascular/Lymphatic: There is abdominal aortic atherosclerosis without aneurysm. There is no gastrohepatic or hepatoduodenal ligament lymphadenopathy. No intraperitoneal or retroperitoneal lymphadenopy. No  mesenteric or pelvic sidewall lymphadenopathy.  Reproductive: Uterus is unremarkable. 3.8 cm cystic lesion identified in the right adnexal space. Small to moderate volume intraperitoneal free fluid is evident. This is similar in appearance to the previous study.  Other: Gas within the subcutaneous fat of the lower anterior right abdominal wall likely related to an injection site.  Musculoskeletal: Bone windows reveal no worrisome lytic or sclerotic osseous lesions.  IMPRESSION: 1. 3.8 cm cystic lesion in the right adnexal space with moderate volume intraperitoneal free fluid. This is more fluid than typically seen for cyst rupture. Pelvic ultrasound may prove helpful to further evaluate, as clinically warranted. 2. Tiny layering calcified stones in the gallbladder lumen appear to be new in the interval since the prior study. 3. Multiple bilateral nonobstructing renal stones.   Electronically Signed   By: Misty Stanley M.D.   On: 02/09/2015 19:38   Ct Abdomen Pelvis W Contrast  01/30/2015   CLINICAL DATA:  Abdominal pain with nausea and vomiting beginning at 1:00 a.m. 01/30/2015.  EXAM: CT ABDOMEN AND PELVIS WITH CONTRAST  TECHNIQUE: Multidetector CT imaging of the abdomen and pelvis was performed using the standard protocol following bolus administration of intravenous contrast.  CONTRAST:  100 mL OMNIPAQUE IOHEXOL 300 MG/ML  SOLN  COMPARISON:  CT abdomen and pelvis 10/14/2013.  FINDINGS: Lung bases are clear. No pleural or pericardial effusion. Cardiomegaly noted.  Multiple small nonobstructing renal stones are seen bilaterally. There is no hydronephrosis on the right or left and no ureteral stones identified. The liver is low attenuating consistent with fatty infiltration. No focal liver lesion is identified. The gallbladder, adrenal glands, spleen, pancreas and biliary tree all appear normal.  There is a small volume of free pelvic fluid which is somewhat greater than typically seen in physiologic change. No  focal fluid collection is identified. There is no lymphadenopathy. The patient is status post tubal ligation. The stomach, small and large bowel and appendix all appear normal. No lytic or sclerotic bony lesion is identified.  IMPRESSION: Small volume of free pelvic fluid is slightly greater than typically seen in physiologic change. Although the bowel appears normal this finding could be due to enteritis.  Fatty infiltration of the liver.  Multiple bilateral nonobstructing renal stones.  Mild cardiomegaly.   Electronically Signed   By: Inge Rise M.D.   On: 01/30/2015 13:44   Dg Chest Portable 1 View  02/09/2015   CLINICAL DATA:  Kidney a weakness shortness of breath  EXAM: PORTABLE CHEST - 1 VIEW  COMPARISON:  01/30/2015  FINDINGS: Heart size within normal limits allowing for rotation and lordotic positioning. Vascular pattern normal. Lungs clear. Persistent minimal left costophrenic angle blunting.  IMPRESSION: Stable mild left blunting of the costophrenic angle suggesting  pleural thickening or mild effusion.   Electronically Signed   By: Skipper Cliche M.D.   On: 02/09/2015 13:52   Dg Abd Acute W/chest  01/30/2015   CLINICAL DATA:  Acute generalized abdominal pain, vomiting.  EXAM: DG ABDOMEN ACUTE W/ 1V CHEST  COMPARISON:  Dec 09, 2014.  FINDINGS: There is no evidence of dilated bowel loops or free intraperitoneal air. Stable bilateral nephrolithiasis. Heart size and mediastinal contours are within normal limits. Stable left basilar scarring is noted. No acute pulmonary disease is noted.  IMPRESSION: No evidence of bowel obstruction or ileus. Stable bilateral nephrolithiasis. No acute cardiopulmonary disease.   Electronically Signed   By: Marijo Conception, M.D.   On: 01/30/2015 11:34   US Abdomen Limited Ruq  02/12/2015   CLINICAL DATA:  44 year old female with upper abdominal pain for the past 4 days with nausea and vomiting.  EXAM: US ABDOMEN LIMITED - RIGHT UPPER QUADRANT  COMPARISON:  CT the  abdomen and pelvis 02/09/2015.  FINDINGS: Gallbladder:  No gallstones or wall thickening visualized. No sonographic Murphy sign noted.  Common bile duct:  Diameter: 2.2 mm  Liver:  No focal lesion identified. Within normal limits in parenchymal echogenicity.  IMPRESSION: 1. No gallstones or findings to suggest an acute cholecystitis at this time.   Electronically Signed   By: Vinnie Langton M.D.   On: 02/12/2015 20:11   ASSESSMENT AND PLAN  Patient is a 44 y.o. female with a PMHx of hypertension, empyema-status post decortication who was admitted to [facility] on 02/09/2015 for evaluation of gastroenteritis and sepsis syndrome. She was found have a mildly positive troponin level and we were consulted for further evaluation.  1. Severe sepsis and GI discomfort - resolved. Minimal GI discomfort now. No BM 2 days, may be able to come off of enteric precautions - per primary team.   2. Elevated trop with new TWI in anterolateral leads - trop 1.7 - Echo 11/19/2014 EF 50-55%, small free flowing pericardial effusion - Echo 02/10/2015 EF 40-45%, akinesis of apical myocardium, grade 2 diastolic dysfunction, moderate LVH, mild MR -ASSESSMENT AND PLAN  Patient is a 44 y.o. female with a PMHx of hypertension, empyema-status post decortication who was admitted to [facility] on 02/09/2015 for evaluation of gastroenteritis and sepsis syndrome. She was found have a mildly positive troponin level and we were consulted for further evaluation.  1. Severe sepsis and GI discomfort - resolved. Minimal GI discomfort now. No BM 2 days, may be able to come off of enteric precautions - per primary team.   2. Elevated trop with new TWI in anterolateral leads - trop 1.7 - Echo 11/19/2014 EF 50-55%, small free flowing pericardial effusion - Echo 02/10/2015 EF 40-45%, akinesis of apical myocardium, grade 2 diastolic dysfunction, moderate LVH, mild  MR - cath with normal coronary arteries and normal LVF.  3. HTN - still elevated. Will put back on home doses of hydralazine, Labetolol and change Clonidine back to PO 4. Empyema s/p decortication 5. 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 Plan for outpatient workup  No further cardiac w/u will sign off.    Sueanne Margarita, MD  02/14/2015  10:25 AM

## 2015-02-15 DIAGNOSIS — R197 Diarrhea, unspecified: Secondary | ICD-10-CM

## 2015-02-15 DIAGNOSIS — E876 Hypokalemia: Secondary | ICD-10-CM

## 2015-02-15 DIAGNOSIS — R109 Unspecified abdominal pain: Secondary | ICD-10-CM

## 2015-02-15 DIAGNOSIS — R1110 Vomiting, unspecified: Secondary | ICD-10-CM

## 2015-02-15 LAB — COMPREHENSIVE METABOLIC PANEL
ALBUMIN: 3 g/dL — AB (ref 3.5–5.0)
ALK PHOS: 31 U/L — AB (ref 38–126)
ALT: 13 U/L — AB (ref 14–54)
ANION GAP: 6 (ref 5–15)
AST: 41 U/L (ref 15–41)
BILIRUBIN TOTAL: 0.4 mg/dL (ref 0.3–1.2)
BUN: 9 mg/dL (ref 6–20)
CHLORIDE: 105 mmol/L (ref 101–111)
CO2: 27 mmol/L (ref 22–32)
Calcium: 8.9 mg/dL (ref 8.9–10.3)
Creatinine, Ser: 0.79 mg/dL (ref 0.44–1.00)
GFR calc Af Amer: >60 mL/min (ref >60)
Glucose, Bld: 106 mg/dL — ABNORMAL HIGH (ref 65–99)
POTASSIUM: 3.2 mmol/L — AB (ref 3.5–5.1)
Sodium: 138 mmol/L (ref 135–145)
TOTAL PROTEIN: 6 g/dL — AB (ref 6.5–8.1)

## 2015-02-15 LAB — CBC
HEMATOCRIT: 31.5 % — AB (ref 36.0–46.0)
Hemoglobin: 10.2 g/dL — ABNORMAL LOW (ref 12.0–15.0)
MCH: 26.3 pg (ref 26.0–34.0)
MCHC: 32.4 g/dL (ref 30.0–36.0)
MCV: 81.2 fL (ref 78.0–100.0)
PLATELETS: 257 10*3/uL (ref 150–400)
RBC: 3.88 MIL/uL (ref 3.87–5.11)
RDW: 17.4 % — AB (ref 11.5–15.5)
WBC: 4.4 10*3/uL (ref 4.0–10.5)

## 2015-02-15 MED ORDER — HYDRALAZINE HCL 50 MG PO TABS: 50.0000 mg | ORAL_TABLET | Freq: Three times a day (TID) | ORAL | Status: DC

## 2015-02-15 MED ORDER — TRAMADOL HCL 50 MG PO TABS: 50.0000 mg | ORAL_TABLET | Freq: Four times a day (QID) | ORAL | Status: DC | PRN

## 2015-02-15 MED ORDER — PANTOPRAZOLE SODIUM 40 MG PO TBEC: 40.0000 mg | DELAYED_RELEASE_TABLET | Freq: Two times a day (BID) | ORAL | Status: DC

## 2015-02-15 MED ORDER — POTASSIUM CHLORIDE CRYS ER 20 MEQ PO TBCR
40.0000 meq | EXTENDED_RELEASE_TABLET | Freq: Once | ORAL | Status: AC
  Administered 2015-02-15: 40 meq via ORAL
  Filled 2015-02-15: qty 2

## 2015-02-15 MED ORDER — PANTOPRAZOLE SODIUM 40 MG PO TBEC
40.0000 mg | DELAYED_RELEASE_TABLET | Freq: Two times a day (BID) | ORAL | Status: DC
Start: 2015-02-15 — End: 2015-02-25

## 2015-02-15 MED ORDER — METRONIDAZOLE 500 MG PO TABS: 500.0000 mg | ORAL_TABLET | Freq: Three times a day (TID) | ORAL | Status: DC

## 2015-02-15 MED ORDER — POTASSIUM CHLORIDE CRYS ER 20 MEQ PO TBCR: 40.0000 meq | EXTENDED_RELEASE_TABLET | Freq: Two times a day (BID) | ORAL | Status: DC

## 2015-02-15 MED ORDER — SACCHAROMYCES BOULARDII 250 MG PO CAPS: 250.0000 mg | ORAL_CAPSULE | Freq: Two times a day (BID) | ORAL | Status: DC

## 2015-02-15 MED ORDER — DOXYCYCLINE HYCLATE 100 MG PO TABS
100.0000 mg | ORAL_TABLET | Freq: Two times a day (BID) | ORAL | Status: DC
Start: 2015-02-15 — End: 2015-02-25

## 2015-02-15 NOTE — Progress Notes (Signed)
The patient states that she is feeling much better at this time. She is almost pain-free, just mild discomfort after eating, but the sort of thing she can live with, so she is probably ready to go home.  The patient appears to be in no distress and in quite good spirits.  Her KUB from yesterday did not show any excessive stool or excessive gas in the GI tract.  Impression: Nonspecific upper abdominal pain. Etiology never determined. Recommendation:  1. Okay for discharge from GI tract standpoint  2. I would probably give the patient a short course (2 weeks) of a PPI  3. No specific GI follow-up is needed. However, if the patient has recurrent or persistent symptoms following discharge, she would be welcome to call my office to arrange follow-up, in which case we might need to consider endoscopic evaluation.  4. I will sign off at this time. Please call if I can be of further assistance in this patient's care.  Cleotis Nipper, M.D. Pager [phone] If no answer or after 5 PM call [phone]

## 2015-02-15 NOTE — Consult Note (Signed)
Phone call to patient who states she has No lower abdominal pain at this time and is likely to be discharged soon. I do recommend short interval followup at [facility], [phone],  For repeat u/s and reeval, probably a week after discharge. Alternatively my office in [place]'s number is [phone] if pt wants to followup there. Thank you. [facility] Attending  [phone]

## 2015-02-15 NOTE — Progress Notes (Signed)
02/15/2015 4:16 PM D/c avs form, medications already taken today and those due this evening given and explained to patient. Follow up appointments and when to call MD reviewed. RX reviewed. D/c iv. D/c tele. D/c home per orders.  Mischell Branford, Arville Lime

## 2015-02-15 NOTE — Discharge Summary (Addendum)
Physician Discharge Summary  Emily Key [id number] DOB: April 14, 1971 DOA: 02/09/2015  PCP: [name], MD  Admit date: 02/09/2015 Discharge date: 02/19/2015  Time spent: 20 minutes  Recommendations for Outpatient Follow-up:  1. Follow up with PCP as scheduled 2. Follow up with GYN in one week 3. Follow up with Dr. Cristina Gong on an as-needed basis  Discharge Diagnoses:  Principal Problem:   Severe sepsis Active Problems:   Hypertensive urgency   Lactic acidosis   Abdominal pain, vomiting, and diarrhea   Abscess   Substance abuse   Pain in the chest   Abdominal pain   Lung abscess   Empyema lung   Essential hypertension   Elevated troponin   Hypokalemia   Discharge Condition: Improved  Diet recommendation: Heart Healthy  Filed Weights   02/13/15 0500 02/14/15 0514 02/15/15 0447  Weight: 86.728 kg (191 lb 3.2 oz) 86.9 kg (191 lb 9.3 oz) 87.952 kg (193 lb 14.4 oz)    History of present illness:  Please see admit h and p from 8/1 for details. Briefly, pt presented with abd pains with n/v/d. Pt was ultimately admitted for further work up  Hospital Course:  Severe sepsis with lactic acidosis - idiopathic No specific confirmed source of infection has been identified - sepsis physiology has since resolved  Pleuritic chest pain CT angio chest was neg for PE  Abdom pain w/ vomiting and intermittent diarrhea  there was concern this was her GB, but GB US was unrevealing w/ no evidence of gallstones - GI was consulted by Gen Surg and has suggested empiric PPI therapy for 2 weeks, follow up with GI as outpatient on as-needed basis  ?Tubo-ovarian abscess GYN has evaluated and does not feel she has a true Tubo-ovarian abscess and instead feels this represents a right hydrosalpinx as well as cyst on the right ovary - no further evaluation planned as inpatient - GYN suggest doxy and flagyl to complete a full empiric tx course w/ short interval f/u in the [facility] for eventual addressing of the hydrosalpinx  Mild troponin elevation - T wave inversions anterolateral Cards has evaluated - troponin peaked at 1.75 - cardiac cath noted normal coronaries. Cardiology signed off  Hypertensive urgency BP control much improved  Recent Left lung abscess/empyema -S/P VATS 11/2014 - no apparent complications at the present time  Procedures: 8/2 - TTE - EF 40-45% - akinesis of apex - grade 2 DD / mod LVH  8/5 - cardiac cath - no appreciable coronary artery disease   Hypokalemia Replaced  Consultations:  GYN  GI  Cardiology  General Surgery  Discharge Exam: Filed Vitals:   02/14/15 2023 02/15/15 0447 02/15/15 0953 02/15/15 1424  BP: 134/73 147/77 146/73 143/76  Pulse: 73 55 76 74  Temp: 97.8 F (36.6 C) 98.2 F (36.8 C)    TempSrc: Oral Oral    Resp: 18 18    Height:      Weight:  87.952 kg (193 lb 14.4 oz)    SpO2: 99% 99%      General: awake, in nad Cardiovascular: regular, s1, s2 Respiratory: normal resp effort, no wheezing  Discharge Instructions     Medication List    STOP taking these medications        amLODipine 10 MG tablet  Commonly known as:  NORVASC      TAKE these medications        cloNIDine 0.2 MG tablet  Commonly known as:  CATAPRES  Take 1  tablet (0.2 mg total) by mouth 3 (three) times daily.     doxycycline 100 MG tablet  Commonly known as:  VIBRA-TABS  Take 1 tablet (100 mg total) by mouth every 12 (twelve) hours.     hydrALAZINE 50 MG tablet  Commonly known as:  APRESOLINE  Take 1 tablet (50 mg total) by mouth every 8 (eight) hours.     labetalol 100 MG tablet  Commonly known as:  NORMODYNE  Take 1 tablet (100 mg total) by mouth 2 (two) times daily.     metroNIDAZOLE 500 MG tablet  Commonly known as:  FLAGYL  Take 1 tablet (500 mg total) by mouth every 8 (eight) hours.     pantoprazole 40 MG tablet  Commonly known as:  PROTONIX  Take 1 tablet (40 mg total) by mouth 2 (two) times  daily.     saccharomyces boulardii 250 MG capsule  Commonly known as:  FLORASTOR  Take 1 capsule (250 mg total) by mouth 2 (two) times daily.     traMADol 50 MG tablet  Commonly known as:  ULTRAM  Take 1 tablet (50 mg total) by mouth every 6 (six) hours as needed for moderate pain.       No Known Allergies Follow-up Information    Follow up with [facility]     On 02/18/2015.   Why:  [facility] appointment on 02/18/15 at 11:15am with Dr. Jarold Song.   Contact information:   [address] [place] [address] [phone]      Follow up with Cleotis Nipper, MD.   Specialty:  Gastroenterology   Why:  As needed   Contact information:   [address] [place] [address] [phone]       Follow up with [facility], [phone], For repeat u/s and re-eval. Schedule an appointment as soon as possible for a visit in 1 week.       The results of significant diagnostics from this hospitalization (including imaging, microbiology, ancillary and laboratory) are listed below for reference.    Significant Diagnostic Studies: Ct Angio Chest Pe W/cm &/or Wo Cm  02/14/2015   CLINICAL DATA:  Chest pain and shortness of breath for 1 week, history hypertension, CHF, heart murmur  EXAM: CT ANGIOGRAPHY CHEST WITH CONTRAST  TECHNIQUE: Multidetector CT imaging of the chest was performed using the standard protocol during bolus administration of intravenous contrast. Multiplanar CT image reconstructions and MIPs were obtained to evaluate the vascular anatomy.  CONTRAST:  7mL OMNIPAQUE IOHEXOL 350 MG/ML SOLN  COMPARISON:  CTA chest 11/12/2014  FINDINGS: Aorta normal caliber without aneurysm or dissection.  Thickened LEFT ventricular wall consistent with LEFT ventricular hypertrophy.  Pulmonary arteries patent.  No evidence of pulmonary embolism.  Mild enlargement of heart with small pericardial effusion  incidentally noted.  No thoracic adenopathy.  Nonobstructing calculi upper pole RIGHT kidney.  Visualized portion of upper abdomen otherwise unremarkable.  Minimal subsegmental atelectasis at LEFT base.  Lungs otherwise clear.  No pleural effusion or pneumothorax.  No acute osseous findings.  Review of the MIP images confirms the above findings.  IMPRESSION: No evidence of pulmonary embolism.  Enlargement of heart with LEFT ventricular hypertrophy and small pericardial effusion.  Minimal subsegmental atelectasis LEFT base, significantly improved since previous exam.  Nonobstructing RIGHT renal calculi.   Electronically Signed   By: Lavonia Dana M.D.   On: 02/14/2015 18:45   US Transvaginal Non-ob  02/10/2015  CLINICAL DATA:  Right adnexal cystic lesion with associated intraperitoneal free fluid.  EXAM: TRANSABDOMINAL AND TRANSVAGINAL ULTRASOUND OF PELVIS  TECHNIQUE: Both transabdominal and transvaginal ultrasound examinations of the pelvis were performed. Transabdominal technique was performed for global imaging of the pelvis including uterus, ovaries, adnexal regions, and pelvic cul-de-sac. It was necessary to proceed with endovaginal exam following the transabdominal exam to visualize the right adnexa.  COMPARISON:  CT of the abdomen pelvis 02/09/2015  FINDINGS: Uterus  Measurements: 9.0 by 5.7 by 5.9 cm. There is a posterior fundal intramural circumscribed soft tissue mass with echogenicity similar to the myometrium likely representing a leiomyoma, which measures 4.2 by 3.3 by 3.7 cm.  Endometrium  Thickness: 6.3 mm.  No focal abnormality visualized.  Right ovary  Measurements: 5.8 x 3.5 x 4.5 cm. It contains 2 thick-walled loculated cystic structures demonstrating intramural low level echogenicity. The larger of these measures 3.2 x 3.2 x 3.7 cm, and the smaller one measures 2.3 x 2.3 x 2.3 cm. No internal blood flow is seen.  Left ovary  Measurements: 2.4 x 1.9 by 2.2. Normal appearance/no adnexal mass.   Other findings  Moderate amount of free fluid is present in the cul-de-sac and within the right adnexa.  IMPRESSION: Thick wall multiloculated cystic structure within the right adnexa. Differential diagnosis includes hemorrhagic cysts, or tubo-ovarian abscess. Ovarian torsion and solid hemorrhagic right adnexal mass are felt less likely, although theoretically possible.  Moderate amount of free fluid in the pelvis.  4.2 cm posterior fundal uterine leiomyoma.   Electronically Signed   By: Fidela Salisbury M.D.   On: 02/10/2015 14:27   US Pelvis Complete  02/10/2015   CLINICAL DATA:  Right adnexal cystic lesion with associated intraperitoneal free fluid.  EXAM: TRANSABDOMINAL AND TRANSVAGINAL ULTRASOUND OF PELVIS  TECHNIQUE: Both transabdominal and transvaginal ultrasound examinations of the pelvis were performed. Transabdominal technique was performed for global imaging of the pelvis including uterus, ovaries, adnexal regions, and pelvic cul-de-sac. It was necessary to proceed with endovaginal exam following the transabdominal exam to visualize the right adnexa.  COMPARISON:  CT of the abdomen pelvis 02/09/2015  FINDINGS: Uterus  Measurements: 9.0 by 5.7 by 5.9 cm. There is a posterior fundal intramural circumscribed soft tissue mass with echogenicity similar to the myometrium likely representing a leiomyoma, which measures 4.2 by 3.3 by 3.7 cm.  Endometrium  Thickness: 6.3 mm.  No focal abnormality visualized.  Right ovary  Measurements: 5.8 x 3.5 x 4.5 cm. It contains 2 thick-walled loculated cystic structures demonstrating intramural low level echogenicity. The larger of these measures 3.2 x 3.2 x 3.7 cm, and the smaller one measures 2.3 x 2.3 x 2.3 cm. No internal blood flow is seen.  Left ovary  Measurements: 2.4 x 1.9 by 2.2. Normal appearance/no adnexal mass.  Other findings  Moderate amount of free fluid is present in the cul-de-sac and within the right adnexa.  IMPRESSION: Thick wall multiloculated  cystic structure within the right adnexa. Differential diagnosis includes hemorrhagic cysts, or tubo-ovarian abscess. Ovarian torsion and solid hemorrhagic right adnexal mass are felt less likely, although theoretically possible.  Moderate amount of free fluid in the pelvis.  4.2 cm posterior fundal uterine leiomyoma.   Electronically Signed   By: Fidela Salisbury M.D.   On: 02/10/2015 14:27   Ct Abdomen Pelvis W Contrast  02/09/2015   CLINICAL DATA:  Initial encounter for left upper quadrant abdominal pain with vomiting and diarrhea.  EXAM: CT ABDOMEN AND PELVIS WITH CONTRAST  TECHNIQUE: Multidetector CT imaging of the abdomen and pelvis was performed using the standard protocol following bolus administration of intravenous contrast.  CONTRAST:  12mL OMNIPAQUE IOHEXOL 300 MG/ML  SOLN  COMPARISON:  01/30/2015.  FINDINGS: Lower chest:  Probable atelectasis in the lingula.  Hepatobiliary: No focal abnormality within the liver parenchyma. Tiny layering gallstones are noted in the dependent gallbladder (see image 62 of series 6). No evidence for gallbladder wall thickening or pericholecystic fluid. No intrahepatic or extrahepatic biliary dilation.  Pancreas: No focal mass lesion. No dilatation of the main duct. No intraparenchymal cyst. No peripancreatic edema.  Spleen: No splenomegaly.  No focal mass lesion.  Adrenals/Urinary Tract: No adrenal nodule or mass. The patient has at least 5 nonobstructing stones in the right kidney, the largest measuring about 3 mm. Three stones are seen in the left kidney without obstruction, all measuring approximately 4-5 mm. No enhancing lesion is seen in either kidney. There is no evidence for hydroureter. No ureteral or bladder stone.  Stomach/Bowel: Stomach is nondistended. No gastric wall thickening. No evidence of outlet obstruction. Duodenum is normally positioned as is the ligament of Treitz. No small bowel wall thickening. No small bowel dilatation. The terminal ileum is  normal. The appendix is normal. Diverticular are seen in the region of the splenic flexure without diverticulitis of the colon.  Vascular/Lymphatic: There is abdominal aortic atherosclerosis without aneurysm. There is no gastrohepatic or hepatoduodenal ligament lymphadenopathy. No intraperitoneal or retroperitoneal lymphadenopy. No mesenteric or pelvic sidewall lymphadenopathy.  Reproductive: Uterus is unremarkable. 3.8 cm cystic lesion identified in the right adnexal space. Small to moderate volume intraperitoneal free fluid is evident. This is similar in appearance to the previous study.  Other: Gas within the subcutaneous fat of the lower anterior right abdominal wall likely related to an injection site.  Musculoskeletal: Bone windows reveal no worrisome lytic or sclerotic osseous lesions.  IMPRESSION: 1. 3.8 cm cystic lesion in the right adnexal space with moderate volume intraperitoneal free fluid. This is more fluid than typically seen for cyst rupture. Pelvic ultrasound may prove helpful to further evaluate, as clinically warranted. 2. Tiny layering calcified stones in the gallbladder lumen appear to be new in the interval since the prior study. 3. Multiple bilateral nonobstructing renal stones.   Electronically Signed   By: Misty Stanley M.D.   On: 02/09/2015 19:38   Ct Abdomen Pelvis W Contrast  01/30/2015   CLINICAL DATA:  Abdominal pain with nausea and vomiting beginning at 1:00 a.m. 01/30/2015.  EXAM: CT ABDOMEN AND PELVIS WITH CONTRAST  TECHNIQUE: Multidetector CT imaging of the abdomen and pelvis was performed using the standard protocol following bolus administration of intravenous contrast.  CONTRAST:  100 mL OMNIPAQUE IOHEXOL 300 MG/ML  SOLN  COMPARISON:  CT abdomen and pelvis 10/14/2013.  FINDINGS: Lung bases are clear. No pleural or pericardial effusion. Cardiomegaly noted.  Multiple small nonobstructing renal stones are seen bilaterally. There is no hydronephrosis on the right or left and no  ureteral stones identified. The liver is low attenuating consistent with fatty infiltration. No focal liver lesion is identified. The gallbladder, adrenal glands, spleen, pancreas and biliary tree all appear normal.  There is a small volume of free pelvic fluid which is somewhat greater than typically seen in physiologic change. No focal fluid collection is identified. There is no lymphadenopathy. The patient is status post tubal ligation. The stomach, small and large bowel and appendix all appear normal. No lytic or sclerotic bony lesion is identified.  IMPRESSION: Small  volume of free pelvic fluid is slightly greater than typically seen in physiologic change. Although the bowel appears normal this finding could be due to enteritis.  Fatty infiltration of the liver.  Multiple bilateral nonobstructing renal stones.  Mild cardiomegaly.   Electronically Signed   By: Inge Rise M.D.   On: 01/30/2015 13:44   Dg Chest Portable 1 View  02/09/2015   CLINICAL DATA:  Kidney a weakness shortness of breath  EXAM: PORTABLE CHEST - 1 VIEW  COMPARISON:  01/30/2015  FINDINGS: Heart size within normal limits allowing for rotation and lordotic positioning. Vascular pattern normal. Lungs clear. Persistent minimal left costophrenic angle blunting.  IMPRESSION: Stable mild left blunting of the costophrenic angle suggesting pleural thickening or mild effusion.   Electronically Signed   By: Skipper Cliche M.D.   On: 02/09/2015 13:52   Dg Abd Acute W/chest  01/30/2015   CLINICAL DATA:  Acute generalized abdominal pain, vomiting.  EXAM: DG ABDOMEN ACUTE W/ 1V CHEST  COMPARISON:  Dec 09, 2014.  FINDINGS: There is no evidence of dilated bowel loops or free intraperitoneal air. Stable bilateral nephrolithiasis. Heart size and mediastinal contours are within normal limits. Stable left basilar scarring is noted. No acute pulmonary disease is noted.  IMPRESSION: No evidence of bowel obstruction or ileus. Stable bilateral  nephrolithiasis. No acute cardiopulmonary disease.   Electronically Signed   By: Marijo Conception, M.D.   On: 01/30/2015 11:34   Dg Abd Portable 1v  02/14/2015   CLINICAL DATA:  44 year old female with abdominal discomfort. Evaluate for constipation.  EXAM: PORTABLE ABDOMEN - 1 VIEW  COMPARISON:  Acute abdominal series 01/30/2015.  FINDINGS: Gas and stool are seen scattered throughout the colon extending to the level of the distal rectum. No pathologic distension of small bowel is noted. Fecal burden does not appear excessive. No gross evidence of pneumoperitoneum. Bilateral tubal ligation clips incidentally noted.  IMPRESSION: 1. Nonobstructive bowel gas pattern. 2. No pneumoperitoneum.   Electronically Signed   By: Vinnie Langton M.D.   On: 02/14/2015 14:18   US Abdomen Limited Ruq  02/12/2015   CLINICAL DATA:  44 year old female with upper abdominal pain for the past 4 days with nausea and vomiting.  EXAM: US ABDOMEN LIMITED - RIGHT UPPER QUADRANT  COMPARISON:  CT the abdomen and pelvis 02/09/2015.  FINDINGS: Gallbladder:  No gallstones or wall thickening visualized. No sonographic Murphy sign noted.  Common bile duct:  Diameter: 2.2 mm  Liver:  No focal lesion identified. Within normal limits in parenchymal echogenicity.  IMPRESSION: 1. No gallstones or findings to suggest an acute cholecystitis at this time.   Electronically Signed   By: Vinnie Langton M.D.   On: 02/12/2015 20:11    Microbiology: Recent Results (from the past 240 hour(s))  Urine culture     Status: None   Collection Time: 02/09/15 12:42 PM  Result Value Ref Range Status   Specimen Description URINE, RANDOM  Final   Special Requests NONE  Final   Culture MULTIPLE SPECIES PRESENT, SUGGEST RECOLLECTION  Final   Report Status 02/10/2015 FINAL  Final  Blood culture (routine x 2)     Status: None   Collection Time: 02/09/15  1:22 PM  Result Value Ref Range Status   Specimen Description BLOOD LEFT ANTECUBITAL  Final   Special  Requests BOTTLES DRAWN AEROBIC AND ANAEROBIC 5CC  Final   Culture NO GROWTH 5 DAYS  Final   Report Status 02/14/2015 FINAL  Final  Blood culture (  routine x 2)     Status: None   Collection Time: 02/09/15  1:54 PM  Result Value Ref Range Status   Specimen Description BLOOD LEFT FOREARM  Final   Special Requests BOTTLES DRAWN AEROBIC AND ANAEROBIC 5CC  Final   Culture NO GROWTH 5 DAYS  Final   Report Status 02/14/2015 FINAL  Final  MRSA PCR Screening     Status: None   Collection Time: 02/09/15  4:56 PM  Result Value Ref Range Status   MRSA by PCR NEGATIVE NEGATIVE Final    Comment:        The GeneXpert MRSA Assay (FDA approved for NASAL specimens only), is one component of a comprehensive MRSA colonization surveillance program. It is not intended to diagnose MRSA infection nor to guide or monitor treatment for MRSA infections.   Urine culture     Status: None   Collection Time: 02/10/15  7:16 AM  Result Value Ref Range Status   Specimen Description URINE, RANDOM  Final   Special Requests NONE  Final   Culture NO GROWTH 1 DAY  Final   Report Status 02/11/2015 FINAL  Final     Labs: Basic Metabolic Panel:  Recent Labs Lab 02/13/15 0500 02/14/15 0435 02/15/15 0554  NA 133* 137 138  K 3.7 3.5 3.2*  CL 101 103 105  CO2 24 27 27   GLUCOSE 83 92 106*  BUN 14 13 9   CREATININE 0.97 0.97 0.79  CALCIUM 9.1 8.9 8.9   Liver Function Tests:  Recent Labs Lab 02/13/15 0500 02/14/15 0435 02/15/15 0554  AST 69* 60* 41  ALT 16 14 13*  ALKPHOS 36* 32* 31*  BILITOT 1.0 0.7 0.4  PROT 7.0 6.0* 6.0*  ALBUMIN 3.4* 3.1* 3.0*    Recent Labs Lab 02/13/15 0500  LIPASE 16*   No results for input(s): AMMONIA in the last 168 hours. CBC:  Recent Labs Lab 02/13/15 0500 02/14/15 0435 02/15/15 0554  WBC 7.5 5.9 4.4  HGB 12.6 11.6* 10.2*  HCT 37.8 34.7* 31.5*  MCV 80.3 81.1 81.2  PLT 334 293 257   Cardiac Enzymes: No results for input(s): CKTOTAL, CKMB, CKMBINDEX,  TROPONINI in the last 168 hours. BNP: BNP (last 3 results)  Recent Labs  11/12/14 0355 02/09/15 1557  BNP 138.6* 3120.5*    ProBNP (last 3 results) No results for input(s): PROBNP in the last 8760 hours.  CBG: No results for input(s): GLUCAP in the last 168 hours.   Signed:  Ahmet Schank K  [facility] 02/19/2015, 2:59 AM

## 2015-02-16 ENCOUNTER — Telehealth: Payer: Self-pay

## 2015-02-16 MED FILL — Heparin Sodium (Porcine) 2 Unit/ML in Sodium Chloride 0.9%: INTRAMUSCULAR | Qty: 500 | Status: AC

## 2015-02-16 NOTE — Telephone Encounter (Signed)
Attempted to contact the patient to check on her status and to confirm her appointment in the [facility] on 02/18/15 @1115 .  The # [phone], provided to Carmela Hurt, CM is disconnected.  The patient also provided Carmela Hurt, CM w/ the # [phone] for her sister, Minette Brine and the person that answered said that it is a wrong number,  This CM then called the # noted as the patient's home # [phone] and left a voice mail message requesting a call back to # [phone] or [phone]. This CM then called the number listed for her sister, Charleen Kirks # [phone] and left a message requesting a call back to # [phone] or [phone].

## 2015-02-17 ENCOUNTER — Telehealth: Payer: Self-pay

## 2015-02-17 NOTE — Telephone Encounter (Signed)
[facility] Post-discharge Follow-Up Phone Call:  Date of Discharge: 02/15/2015 Principal Discharge Diagnosis(es): sepsis Post-discharge Communication: Spoke to the patient Call Completed: Yes                  With Whom: Patient Interpreter Needed: No                   Please check all that apply:  X Patient is knowledgeable of his/her condition(s) and/or treatment. X Patient is caring for self at home. Family can assist if needed.  ? Patient is receiving assist at home from family and/or caregiver. Family and/or caregiver is knowledgeable of patient's condition(s) and/or treatment. ? Patient is receiving home health services. If so, name of agency.     Medication Reconciliation:  ? Medication list reviewed with patient. X  Patient obtained all discharge medications. If not, why? She stated that she does not have any of her medications. She noted that she has an appointment at the [facility] tomorrow and was planning to get them at that time.  This CM stressed the importance of medication compliance and the patient verbalized understanding.   Activities of Daily Living:  X  Independent - No DME or home care services ordered. ? Needs assist (describe; ? home DME used) ? [facility] (describe, ? home DME used)   Community resources in place for patient:  X  None  ? Home Health/Home DME ? Assisted Living ? Support Group          Patient Education: Reviewed the services provided at the [facility] including pharmacy assistance, social work and financial counseling.  This CM also explained the close follow up provided by the [facility].         Questions/Concerns discussed: No problems/concerns reported by the patient. She said that she is doing "fine."  This CM confirmed her appointment for tomorrow, 02/18/15 @ 1115 w/ Dr Jarold Song.  She said that she will work on getting transportation to the clinic and CM instructed her to call the clinic in the morning if she does not have transportation in  order to help arrange transportation for her and she verbalized understanding.

## 2015-02-17 NOTE — Telephone Encounter (Signed)
Called the patient to check on her status and to confirm her appointment for tomorrow.

## 2015-02-18 ENCOUNTER — Ambulatory Visit: Payer: Medicaid Other | Attending: Family Medicine | Admitting: Family Medicine

## 2015-02-18 ENCOUNTER — Encounter: Payer: Self-pay | Admitting: Family Medicine

## 2015-02-18 VITALS — BP 157/103 | HR 104 | Temp 97.5°F | Ht 68.0 in | Wt 193.0 lb

## 2015-02-18 DIAGNOSIS — Z79899 Other long term (current) drug therapy: Secondary | ICD-10-CM | POA: Diagnosis not present

## 2015-02-18 DIAGNOSIS — A419 Sepsis, unspecified organism: Secondary | ICD-10-CM | POA: Diagnosis not present

## 2015-02-18 DIAGNOSIS — R1900 Intra-abdominal and pelvic swelling, mass and lump, unspecified site: Secondary | ICD-10-CM | POA: Diagnosis not present

## 2015-02-18 DIAGNOSIS — R197 Diarrhea, unspecified: Secondary | ICD-10-CM

## 2015-02-18 DIAGNOSIS — Z87891 Personal history of nicotine dependence: Secondary | ICD-10-CM | POA: Diagnosis not present

## 2015-02-18 DIAGNOSIS — E876 Hypokalemia: Secondary | ICD-10-CM | POA: Diagnosis not present

## 2015-02-18 DIAGNOSIS — D259 Leiomyoma of uterus, unspecified: Secondary | ICD-10-CM | POA: Diagnosis not present

## 2015-02-18 DIAGNOSIS — R6520 Severe sepsis without septic shock: Secondary | ICD-10-CM

## 2015-02-18 DIAGNOSIS — K219 Gastro-esophageal reflux disease without esophagitis: Secondary | ICD-10-CM | POA: Insufficient documentation

## 2015-02-18 DIAGNOSIS — E872 Acidosis: Secondary | ICD-10-CM | POA: Insufficient documentation

## 2015-02-18 DIAGNOSIS — R109 Unspecified abdominal pain: Secondary | ICD-10-CM | POA: Diagnosis not present

## 2015-02-18 DIAGNOSIS — R1110 Vomiting, unspecified: Secondary | ICD-10-CM | POA: Diagnosis not present

## 2015-02-18 DIAGNOSIS — N949 Unspecified condition associated with female genital organs and menstrual cycle: Secondary | ICD-10-CM | POA: Diagnosis not present

## 2015-02-18 DIAGNOSIS — N9489 Other specified conditions associated with female genital organs and menstrual cycle: Secondary | ICD-10-CM

## 2015-02-18 DIAGNOSIS — I10 Essential (primary) hypertension: Secondary | ICD-10-CM

## 2015-02-18 MED ORDER — LISINOPRIL 20 MG PO TABS: 20.0000 mg | ORAL_TABLET | Freq: Every day | ORAL | Status: DC

## 2015-02-18 MED ORDER — CLONIDINE HCL 0.1 MG PO TABS
0.1000 mg | ORAL_TABLET | Freq: Once | ORAL | Status: AC
  Administered 2015-02-18: 0.1 mg via ORAL

## 2015-02-18 NOTE — Patient Instructions (Signed)

## 2015-02-18 NOTE — Progress Notes (Signed)
Patient recently dc'd from hospital after being septic  She has none of her medications and was told our pharmacy did not carry the probiotics and she has heard they were expensive She is feeling better but has a headache today BP 199/136 pulse 83

## 2015-02-18 NOTE — Progress Notes (Signed)
TRANSITIONAL CARE CLINIC  Admit date: 02/09/15 Discharge date: 02/19/15  Date of telephone encounter: 02/17/15  PCP: None  Emily Key, is a 44 y.o. female  DGL:[id number]  JJO:[id number]  DOB - [birth year]  CC:  Chief Complaint  Patient presents with  . Hospitalization Follow-up  . Blood Infection  . Hypertension       HPI: Emily Key is a 44 y.o. female with a history of hypertension, GERD, history of pneumonia and parapneumonic effusion status post VATS in 11/2014 who presented to the ED at [facility] with abdominal pain, vomiting, dark colored diarrhea and had recently been managed for enteritis and discharged on Cipro and Flagyl prior to this presentation.  On presentation she was found to be septic with hypertensive urgency (BP of 216/144), low grade fever (100.3), tachycardia (ZS010), tachypnea (RR42), leukocytosis (wbc 14.9) and marked lactic acidosis of > 4 and she was admitted to stepdown. She was placed on IV fluids and nothing by mouth, commenced on antihypertensive resulting improvement in blood pressure. Stool cultures were sent off which returned negative, CT abdomen and pelvis revealed 3.8 cm cystic lesion in the right adnexa with moderate volume intraperitoneal free fluid, tiny calcified stones in the gallbladder lumen, multiple bilateral nonobstructing renal stones. A follow-up pelvic ultrasound was performed which revealed thick wall multilobulated cystic structure within the right adnexa, differentials include hemorrhagic cyst or tubo-ovarian abscess. GYN was consulted and she was placed on doxycycline and Flagyl and no inpatient intervention suggested but recommended close outpatient follow-up. GI and general surgery were consulted and recommendations were placement on IV proton pump inhibitor  She also had complained of chest pains and had elevated troponins of 1.53, 1.71, 1.74, 1.75 and EKG revealed normal sinus rhythm, anterior fascicular block, ST changes in  keeping with lateral wall ischemia, 2-D echo revealed EF of 40-45%, akinesis of the apex, grade 2 diastolic dysfunction and moderate LVH. She had a cardiac cath on 02/13/15 which revealed LVH with normal systolic function, EF of 93% and normal coronaries. CT and she was negative for PE.  Her condition improved and she was subsequently discharged.  Interval history: She is yet to pick up her antihypertensives from the pharmacy as she did not have transportation to the clinic. She has no complaints today. Patient has No headache, No chest pain, No abdominal pain - No Nausea, No new weakness tingling or numbness, No Cough - SOB.  Barriers to care: Transportation is a major issue and this had to be arranged to and from the clinic for her to make today's visit.  No Known Allergies Past Medical History  Diagnosis Date  . Hypertension   . CHF (congestive heart failure)     after last pregnancy  . Nausea, vomiting and diarrhea 01/30/2015  . Heart murmur     "born w/one":  Marland Kitchen Pneumonia 11/2014  . History of hiatal hernia   . Daily headache    Current Outpatient Prescriptions on File Prior to Visit  Medication Sig Dispense Refill  . cloNIDine (CATAPRES) 0.2 MG tablet Take 1 tablet (0.2 mg total) by mouth 3 (three) times daily. (Patient not taking: Reported on 02/18/2015) 90 tablet 3  . doxycycline (VIBRA-TABS) 100 MG tablet Take 1 tablet (100 mg total) by mouth every 12 (twelve) hours. (Patient not taking: Reported on 02/18/2015) 10 tablet 0  . hydrALAZINE (APRESOLINE) 50 MG tablet Take 1 tablet (50 mg total) by mouth every 8 (eight) hours. (Patient not taking: Reported on 02/18/2015) 90 tablet 0  .  labetalol (NORMODYNE) 100 MG tablet Take 1 tablet (100 mg total) by mouth 2 (two) times daily. (Patient not taking: Reported on 02/18/2015) 60 tablet 0  . metroNIDAZOLE (FLAGYL) 500 MG tablet Take 1 tablet (500 mg total) by mouth every 8 (eight) hours. (Patient not taking: Reported on 02/18/2015) 40 tablet 0    . pantoprazole (PROTONIX) 40 MG tablet Take 1 tablet (40 mg total) by mouth 2 (two) times daily. (Patient not taking: Reported on 02/18/2015) 14 tablet 0  . saccharomyces boulardii (FLORASTOR) 250 MG capsule Take 1 capsule (250 mg total) by mouth 2 (two) times daily. (Patient not taking: Reported on 02/18/2015) 30 capsule 0  . traMADol (ULTRAM) 50 MG tablet Take 1 tablet (50 mg total) by mouth every 6 (six) hours as needed for moderate pain. (Patient not taking: Reported on 02/18/2015) 30 tablet 0   No current facility-administered medications on file prior to visit.   Family History  Problem Relation Age of Onset  . Diabetes Mellitus II Mother   . Hypertension Mother   . Lung cancer Father    Social History   Social History  . Marital Status: Divorced    Spouse Name: N/A  . Number of Children: N/A  . Years of Education: N/A   Occupational History  . Not on file.   Social History Main Topics  . Smoking status: Former Smoker -- 0.33 packs/day for 10 years    Types: Cigarettes    Quit date: 11/11/2014  . Smokeless tobacco: Never Used  . Alcohol Use: 0.0 oz/week    0 Standard drinks or equivalent per week     Comment: 8/1/20176 "a couple beers a couple times/month"  . Drug Use: No  . Sexual Activity: Yes   Other Topics Concern  . Not on file   Social History Narrative    Review of Systems: Constitutional: Negative for fever, chills, diaphoresis, activity change, appetite change and fatigue. HENT: Negative for ear pain, nosebleeds, congestion, facial swelling, rhinorrhea, neck pain, neck stiffness and ear discharge.  Eyes: Negative for pain, discharge, redness, itching and visual disturbance. Respiratory: Negative for cough, choking, chest tightness, shortness of breath, wheezing and stridor.  Cardiovascular: Negative for chest pain, palpitations and leg swelling. Gastrointestinal: Negative for abdominal distention. Genitourinary: Negative for dysuria, urgency, frequency,  hematuria, flank pain, decreased urine volume, difficulty urinating and dyspareunia.  Musculoskeletal: Negative for back pain, joint swelling, arthralgia and gait problem. Neurological: Negative for dizziness, tremors, seizures, syncope, facial asymmetry, speech difficulty, weakness, light-headedness, numbness and headaches.  Hematological: Negative for adenopathy. Does not bruise/bleed easily. Skin: Negative for rash, ulcer. Psychiatric/Behavioral: Negative for hallucinations, behavioral problems, confusion, dysphoric mood, decreased concentration and agitation.    Objective: Filed Vitals:   02/18/15 1154 02/18/15 1244 02/18/15 1306 02/18/15 1400  BP: 199/136 192/129 219/123 157/103  Pulse: 83  90 104  Temp: 97.5 F (36.4 C)     Height: 5\' 8"  (1.727 m)     Weight: 193 lb (87.544 kg)     SpO2: 100%           Physical Exam: Constitutional: Patient appears well-developed and well-nourished. No distress. HENT: Normocephalic, atraumatic, External right and left ear normal. Oropharynx is clear and moist.  Eyes: Conjunctivae and EOM are normal. PERRLA, no scleral icterus. Neck: Normal ROM, No JVD. No tracheal deviation. No thyromegaly. CVS: RRR, S1/S2 +, no murmurs, no gallops, no carotid bruit.  Pulmonary: Effort and breath sounds normal, no stridor, rhonchi, wheezes, rales.  Abdominal: Soft. BS +,  no distension, tenderness, rebound or guarding.  Musculoskeletal: Normal range of motion. No edema and no tenderness.  Lymphadenopathy: No lymphadenopathy noted, cervical, inguinal or axillary Neuro: Alert. Normal reflexes, muscle tone coordination. No cranial nerve deficit. Skin: Scars on left thoracic region from previous VATS procedure  Psychiatric: Normal mood and affect. Behavior, judgment, thought content normal.  Lab Results  Component Value Date   WBC 4.4 02/15/2015   HGB 10.2* 02/15/2015   HCT 31.5* 02/15/2015   MCV 81.2 02/15/2015   PLT 257 02/15/2015   Lab Results    Component Value Date   CREATININE 0.79 02/15/2015   BUN 9 02/15/2015   NA 138 02/15/2015   K 3.2* 02/15/2015   CL 105 02/15/2015   CO2 27 02/15/2015    No results found for: HGBA1C Lipid Panel     Component Value Date/Time   CHOL 120 11/12/2014 1500   CLINICAL DATA: Right adnexal cystic lesion with associated intraperitoneal free fluid.  EXAM: TRANSABDOMINAL AND TRANSVAGINAL ULTRASOUND OF PELVIS  TECHNIQUE: Both transabdominal and transvaginal ultrasound examinations of the pelvis were performed. Transabdominal technique was performed for global imaging of the pelvis including uterus, ovaries, adnexal regions, and pelvic cul-de-sac. It was necessary to proceed with endovaginal exam following the transabdominal exam to visualize the right adnexa.  COMPARISON: CT of the abdomen pelvis 02/09/2015  FINDINGS: Uterus  Measurements: 9.0 by 5.7 by 5.9 cm. There is a posterior fundal intramural circumscribed soft tissue mass with echogenicity similar to the myometrium likely representing a leiomyoma, which measures 4.2 by 3.3 by 3.7 cm.  Endometrium  Thickness: 6.3 mm. No focal abnormality visualized.  Right ovary  Measurements: 5.8 x 3.5 x 4.5 cm. It contains 2 thick-walled loculated cystic structures demonstrating intramural low level echogenicity. The larger of these measures 3.2 x 3.2 x 3.7 cm, and the smaller one measures 2.3 x 2.3 x 2.3 cm. No internal blood flow is seen.  Left ovary  Measurements: 2.4 x 1.9 by 2.2. Normal appearance/no adnexal mass.  Other findings  Moderate amount of free fluid is present in the cul-de-sac and within the right adnexa.  IMPRESSION: Thick wall multiloculated cystic structure within the right adnexa. Differential diagnosis includes hemorrhagic cysts, or tubo-ovarian abscess. Ovarian torsion and solid hemorrhagic right adnexal mass are felt less likely, although theoretically possible.  Moderate amount  of free fluid in the pelvis.  4.2 cm posterior fundal uterine leiomyoma.   Electronically Signed  By: Fidela Salisbury M.D.  On: 02/10/2015 14:27    Assessment and plan:  44 year old female with a history of uncontrolled hypertension, pneumonia and parapneumonic effusion status post VATS (in 11/2014), GERD, recent hospitalization for enteritis who is in the transitional care clinic today for follow-up of severe sepsis with lactic acidosis.  Accelerated hypertension: BP was 199/136 Clonidine 0.1 mg given in the clinic and repeat blood pressure after 30 minutes still elevated; repeat 0.1 mg given again and patient observed for one hour with resulting improvement in blood pressure to 157/103. Current elevation due to rebound hypertension the fact that she is yet to pick up her antihypertensives. Pharmacy notified and they promised patient would be getting her clonidine prior to discharge from the clinic BP will be reassessed at her next office visit.  Sepsis with lactic acidosis: Idiopathic, with no specific source of infection identified during hospitalization however this has resolved at this time.  Hypokalemia: Discontinued amlodipine and placed on lisinopril to correct this. Basic metabolic panel at next office visit.  Adnexal mass: ?  Tubo-ovarian abscess. Currently on antibiotics. Referred to GYN for further evaluation.  Abdominal pain and vomiting: Resolved.  The patient was given clear instructions to go to ER or return to medical center if symptoms don't improve, worsen or new problems develop. The patient verbalized understanding. The patient was told to call to get lab results if they haven't heard anything in the next week.     [name], [facility] [phone] 02/18/2015, 12:43 PM

## 2015-02-19 ENCOUNTER — Telehealth: Payer: Self-pay

## 2015-02-19 NOTE — Telephone Encounter (Signed)
Call placed to patient to check on status,  to determine if she picked up medications after her appointment on 02/18/15, and to remind patient of appointment on 02/25/15 at 1230 with Dr. Jarold Song. Calls placed to patient's home [phone]) and mobile [phone]); unable to reach patient.  HIPPA compliant messages left for patient requesting return call.  Awaiting return call.

## 2015-02-20 ENCOUNTER — Telehealth: Payer: Self-pay

## 2015-02-20 NOTE — Telephone Encounter (Signed)
Call placed to patient to check on status, to determine if she picked up medications after her appointment on 02/18/15, and to remind patient of appointment on 02/25/15 at 1230 with Dr. Jarold Song. Placed additional call to patient's home [phone]) and mobile [phone]); unable to reach patient. HIPPA compliant messages left for patient requesting return call. Awaiting return call.

## 2015-02-24 ENCOUNTER — Telehealth: Payer: Self-pay

## 2015-02-24 NOTE — Telephone Encounter (Signed)
Attempted to contact the patient to check on her status and to confirm her appointment for tomorrow.  Calls placed to [phone] (M)  And [phone] (H) - voice mail messages left requesting a call back to # [phone] or [phone].

## 2015-02-25 ENCOUNTER — Ambulatory Visit: Payer: Medicaid Other | Attending: Family Medicine | Admitting: Family Medicine

## 2015-02-25 ENCOUNTER — Encounter: Payer: Self-pay | Admitting: Family Medicine

## 2015-02-25 ENCOUNTER — Encounter: Payer: Self-pay | Admitting: Obstetrics & Gynecology

## 2015-02-25 VITALS — BP 135/82 | HR 67 | Temp 99.3°F | Ht 68.0 in | Wt 195.0 lb

## 2015-02-25 DIAGNOSIS — I10 Essential (primary) hypertension: Secondary | ICD-10-CM

## 2015-02-25 DIAGNOSIS — R6520 Severe sepsis without septic shock: Secondary | ICD-10-CM | POA: Diagnosis not present

## 2015-02-25 DIAGNOSIS — E876 Hypokalemia: Secondary | ICD-10-CM | POA: Diagnosis not present

## 2015-02-25 DIAGNOSIS — N949 Unspecified condition associated with female genital organs and menstrual cycle: Secondary | ICD-10-CM

## 2015-02-25 DIAGNOSIS — Z87891 Personal history of nicotine dependence: Secondary | ICD-10-CM | POA: Diagnosis not present

## 2015-02-25 DIAGNOSIS — R1084 Generalized abdominal pain: Secondary | ICD-10-CM | POA: Diagnosis not present

## 2015-02-25 DIAGNOSIS — N9489 Other specified conditions associated with female genital organs and menstrual cycle: Secondary | ICD-10-CM

## 2015-02-25 DIAGNOSIS — A419 Sepsis, unspecified organism: Secondary | ICD-10-CM | POA: Insufficient documentation

## 2015-02-25 LAB — BASIC METABOLIC PANEL
BUN: 17 mg/dL (ref 7–25)
CHLORIDE: 100 mmol/L (ref 98–110)
CO2: 25 mmol/L (ref 20–31)
Calcium: 9.9 mg/dL (ref 8.6–10.2)
Creat: 0.97 mg/dL (ref 0.50–1.10)
Glucose, Bld: 108 mg/dL — ABNORMAL HIGH (ref 65–99)
Potassium: 4.1 mmol/L (ref 3.5–5.3)
SODIUM: 136 mmol/L (ref 135–146)

## 2015-02-25 NOTE — Progress Notes (Signed)
TRANSITIONAL CARE CLINIC  Admit date: 02/09/15 Discharge date: 02/19/15  Date of telephone encounter: 02/17/15  Date of first face-to-face visit: 02/18/59   Subjective:    Patient ID: Emily Key, female    DOB: 1970-11-14, 44 y.o.   MRN: [id number]  HPI Emily Key was seen at the transitional care clinic last week for accelerated HTN. Medical history is notable for a history of hypertension, GERD, history of pneumonia and parapneumonic effusion status post VATS in 11/2014 who presented to the ED at [facility] with abdominal pain, vomiting, dark colored diarrhea and had recently been managed for enteritis and discharged on Cipro and Flagyl prior to this presentation.  On presentation she was found to be septic with hypertensive urgency (BP of 216/144), low grade fever (100.3), tachycardia (BT517), tachypnea (RR42), leukocytosis (wbc 14.9) and marked lactic acidosis of > 4 and she was admitted to stepdown. She was placed on IV fluids and nothing by mouth, commenced on antihypertensive resulting improvement in blood pressure. Stool cultures were sent off which returned negative, CT abdomen and pelvis revealed 3.8 cm cystic lesion in the right adnexa with moderate volume intraperitoneal free fluid, tiny calcified stones in the gallbladder lumen, multiple bilateral nonobstructing renal stones. A follow-up pelvic ultrasound was performed which revealed thick wall multilobulated cystic structure within the right adnexa, differentials include hemorrhagic cyst or tubo-ovarian abscess. GYN was consulted and she was placed on doxycycline and Flagyl and no inpatient intervention suggested but recommended close outpatient follow-up. GI and general surgery were consulted and recommendations were placement on IV proton pump inhibitor  She also had complained of chest pains and had elevated troponins of 1.53, 1.71, 1.74, 1.75 and EKG revealed normal sinus rhythm, anterior fascicular block, ST changes in keeping  with lateral wall ischemia, 2-D echo revealed EF of 40-45%, akinesis of the apex, grade 2 diastolic dysfunction and moderate LVH. She had a cardiac cath on 02/13/15 which revealed LVH with normal systolic function, EF of 61% and normal coronaries. CT and she was negative for PE.   Interval history: She had been out of all her medications at her last presentation and subsequently picked up her prescriptions with resulting improvement in her blood pressure today. Blood work from a week ago revealed hypokalemia of 3.2 at discharge and she received 2 tablets of potassium according to the patient; she was commenced on lisinopril at her last office visit. She has no acute complaints today.  Past Medical History  Diagnosis Date  . Hypertension   . CHF (congestive heart failure)     after last pregnancy  . Nausea, vomiting and diarrhea 01/30/2015  . Heart murmur     "born w/one":  Marland Kitchen Pneumonia 11/2014  . History of hiatal hernia   . Daily headache     Past Surgical History  Procedure Laterality Date  . Video assisted thoracoscopy (vats)/decortication Left 11/14/2014    Procedure: LEFT VIDEO ASSISTED THORACOSCOPY (VATS)/DECORTICATION;  Surgeon: Melrose Nakayama, MD;  Location: [place];  Service: Thoracic;  Laterality: Left;  . Pleural effusion drainage Left 11/14/2014    Procedure: DRAINAGE OF LEFT PLEURAL EFFUSION;  Surgeon: Melrose Nakayama, MD;  Location: [place];  Service: Thoracic;  Laterality: Left;  . Tubal ligation  2000  . Tonsillectomy and adenoidectomy  ~ 1986  . Cardiac catheterization N/A 02/13/2015    Procedure: Left Heart Cath and Coronary Angiography;  Surgeon: Troy Sine, MD;  Location: [place] [facility];  Service: Cardiovascular;  Laterality: N/A;  '  Social History   Social History  . Marital Status: Divorced    Spouse Name: N/A  . Number of Children: N/A  . Years of Education: N/A   Occupational History  . Not on file.   Social History Main Topics  . Smoking status:  Former Smoker -- 0.33 packs/day for 10 years    Types: Cigarettes    Quit date: 11/11/2014  . Smokeless tobacco: Never Used  . Alcohol Use: 0.0 oz/week    0 Standard drinks or equivalent per week     Comment: 8/1/20176 "a couple beers a couple times/month"  . Drug Use: No  . Sexual Activity: Yes   Other Topics Concern  . Not on file   Social History Narrative    No Known Allergies  Current Outpatient Prescriptions on File Prior to Visit  Medication Sig Dispense Refill  . cloNIDine (CATAPRES) 0.2 MG tablet Take 1 tablet (0.2 mg total) by mouth 3 (three) times daily. 90 tablet 3  . hydrALAZINE (APRESOLINE) 50 MG tablet Take 1 tablet (50 mg total) by mouth every 8 (eight) hours. 90 tablet 0  . labetalol (NORMODYNE) 100 MG tablet Take 1 tablet (100 mg total) by mouth 2 (two) times daily. 60 tablet 0  . lisinopril (PRINIVIL,ZESTRIL) 20 MG tablet Take 1 tablet (20 mg total) by mouth daily. 30 tablet 3   No current facility-administered medications on file prior to visit.    Review of Systems Constitutional: Negative for fever, chills, diaphoresis, activity change, appetite change and fatigue. HENT: Negative for ear pain, nosebleeds, congestion, facial swelling, rhinorrhea, neck pain, neck stiffness and ear discharge.  Eyes: Negative for pain, discharge, redness, itching and visual disturbance. Respiratory: Negative for cough, choking, chest tightness, shortness of breath, wheezing and stridor.  Cardiovascular: Negative for chest pain, palpitations and leg swelling. Gastrointestinal: Negative for abdominal distention. Genitourinary: Negative for dysuria, urgency, frequency, hematuria, flank pain, decreased urine volume, difficulty urinating and dyspareunia.  Musculoskeletal: Negative for back pain, joint swelling, arthralgia and gait problem. Neurological: Negative for dizziness, tremors, seizures, syncope, facial asymmetry, speech difficulty, weakness, light-headedness, numbness and  headaches.  Hematological: Negative for adenopathy. Does not bruise/bleed easily. Skin: Negative for rash, ulcer. Psychiatric/Behavioral: Negative for hallucinations, behavioral problems, confusion, dysphoric mood, decreased concentration and agitation.      Objective: Filed Vitals:   02/25/15 1242  BP: 135/82  Pulse: 67  Temp: 99.3 F (37.4 C)  Height: 5\' 8"  (1.727 m)  Weight: 195 lb (88.451 kg)  SpO2: 100%      Physical Exam Constitutional: Patient appears well-developed and well-nourished. No distress. HENT: Normocephalic, atraumatic, External right and left ear normal. Oropharynx is clear and moist.  Eyes: Conjunctivae and EOM are normal. PERRLA, no scleral icterus. Neck: Normal ROM, No JVD. No tracheal deviation. No thyromegaly. CVS: RRR, S1/S2 +, no murmurs, no gallops, no carotid bruit.  Pulmonary: Effort and breath sounds normal, no stridor, rhonchi, wheezes, rales.  Abdominal: Soft. BS +, no distension, tenderness, rebound or guarding.  Musculoskeletal: Normal range of motion. No edema and no tenderness.  Lymphadenopathy: No lymphadenopathy noted, cervical, inguinal or axillary Neuro: Alert. Normal reflexes, muscle tone coordination. No cranial nerve deficit. Skin: Scars on left thoracic region from previous VATS procedure  Psychiatric: Normal mood and affect. Behavior, judgment, thought content normal.       Assessment & Plan:  44 year old female with a history of uncontrolled hypertension, pneumonia and parapneumonic effusion status post VATS (in 11/2014), GERD, recent hospitalization for enteritis who is in the  transitional care clinic today for follow-up of Accelerated Hypertension  Essential hypertension: Controlled. Continue antihypertensives.  Sepsis with lactic acidosis: Idiopathic, with no specific source of infection identified during hospitalization however this has resolved at this time.  Hypokalemia: Basic metabolic panel. Continue lisinopril and if  still hypokalemic I will place her on potassium.  Adnexal mass: ? Tubo-ovarian abscess. Completed course of antibiotics. Referred to GYN for further evaluation.  Abdominal pain and vomiting: Resolved.

## 2015-02-25 NOTE — Progress Notes (Signed)
Patient here to follow up on her HTN Pressure today is 135/82 pulse is 67 States she is feeling great and has no pain or issues

## 2015-02-26 ENCOUNTER — Telehealth: Payer: Self-pay | Admitting: Family Medicine

## 2015-02-26 NOTE — Telephone Encounter (Signed)
Patient called requesting to speak to case manager Opal Sidles, returning phone call please f/u

## 2015-02-26 NOTE — Telephone Encounter (Signed)
-----   Message from [name], MD sent at 02/26/2015  8:13 AM EDT ----- Potassium is normal; replacement is not indicated.

## 2015-02-26 NOTE — Telephone Encounter (Signed)
Verified name and date of birth and told patient her potassium level was normal.  Patient understood and no questions asked.

## 2015-03-10 ENCOUNTER — Telehealth: Payer: Self-pay

## 2015-03-10 NOTE — Telephone Encounter (Signed)
Attempted to contact the patient to check on her status.  Call placed to # [phone] (M) - voice mail message left requesting a call back to # [phone] or [phone]. Call then placed to # [phone] (H) and her sister, Minette Brine, answered and said that she would have the patient call back # [phone].

## 2015-03-12 ENCOUNTER — Encounter: Payer: Medicaid Other | Admitting: Obstetrics & Gynecology

## 2015-03-19 ENCOUNTER — Telehealth: Payer: Self-pay

## 2015-03-19 NOTE — Telephone Encounter (Signed)
Call placed to patient to check on status and to remind her of her appointment on 03/27/15 at 1400 to establish care with Dr. Adrian Blackwater. Patient indicated she was feeling "great." Medication list reviewed. Patient indicated she has all medications and has been taking them as prescribed.  She indicated she was running low on one medication and planned to get medication refilled on 03/20/15.  Patient reminded of appointment on 03/27/15 to establish care with Dr. Adrian Blackwater. Patient verbalized understanding and indicated she planned to be at appointment. No other needs identified.

## 2015-03-26 ENCOUNTER — Telehealth: Payer: Self-pay

## 2015-03-26 NOTE — Telephone Encounter (Signed)
Placed call to patient to remind her of appointment on 03/27/15 at 1400 to establish care with Dr. Adrian Blackwater. Patient aware of appointment and indicates she plans to be at appointment. Patient indicated she needs refill of labetalol and hydralazine. Patient to discuss needed medications with Dr. Adrian Blackwater on 03/27/15 as medications not written by [facility] and [facility] physician. No other needs identified.

## 2015-03-27 ENCOUNTER — Ambulatory Visit: Payer: Medicaid Other | Admitting: Family Medicine

## 2015-03-30 ENCOUNTER — Telehealth: Payer: Self-pay

## 2015-03-30 NOTE — Telephone Encounter (Signed)
Attempted to contact the patient to check on her status and to schedule a follow up appointment. Call placed to #  [phone] and a voice mail message was left after a long pause in the call before this CM was able to leave a message.  Call back requested to # [phone] or [phone].  A call was then placed X3 to  # [phone] (H) and the call was dropped before the phone even rang.

## 2015-04-01 ENCOUNTER — Encounter: Payer: Medicaid Other | Admitting: Obstetrics and Gynecology

## 2015-04-07 ENCOUNTER — Telehealth: Payer: Self-pay

## 2015-04-07 NOTE — Telephone Encounter (Addendum)
Attempted to contact the patient to check on her status and to discuss scheduling a follow up appointment. Call placed to # [phone] (M) and a voice mail message was left requesting a call back to # [phone] or [phone].  A call was then placed to # [phone] (H) and the message indicated that the number was not receiving any calls.

## 2015-04-09 ENCOUNTER — Telehealth: Payer: Self-pay

## 2015-04-09 NOTE — Telephone Encounter (Signed)
This Case Manager placed call to patient to discuss need to reschedule appointment with Dr. Adrian Blackwater to establish care. Call placed to [phone]-message indicated number "not accepting incoming calls" In addition, call placed to [phone] and left HIPPA compliant voicemail. Also placed call to [phone] and left HIPPA compliant voicemail requesting return call. Awaiting return call from patient.

## 2015-04-10 ENCOUNTER — Encounter: Payer: Self-pay | Admitting: Family Medicine

## 2015-04-10 ENCOUNTER — Ambulatory Visit: Payer: Medicaid Other | Attending: Family Medicine | Admitting: Family Medicine

## 2015-04-10 VITALS — BP 135/91 | HR 96 | Temp 98.8°F | Resp 16 | Ht 68.0 in | Wt 193.0 lb

## 2015-04-10 DIAGNOSIS — Z23 Encounter for immunization: Secondary | ICD-10-CM | POA: Diagnosis not present

## 2015-04-10 DIAGNOSIS — R7989 Other specified abnormal findings of blood chemistry: Secondary | ICD-10-CM

## 2015-04-10 DIAGNOSIS — R748 Abnormal levels of other serum enzymes: Secondary | ICD-10-CM | POA: Insufficient documentation

## 2015-04-10 DIAGNOSIS — J302 Other seasonal allergic rhinitis: Secondary | ICD-10-CM | POA: Diagnosis not present

## 2015-04-10 DIAGNOSIS — J869 Pyothorax without fistula: Secondary | ICD-10-CM | POA: Diagnosis not present

## 2015-04-10 DIAGNOSIS — Z87891 Personal history of nicotine dependence: Secondary | ICD-10-CM | POA: Diagnosis not present

## 2015-04-10 DIAGNOSIS — Z79899 Other long term (current) drug therapy: Secondary | ICD-10-CM | POA: Diagnosis not present

## 2015-04-10 DIAGNOSIS — Z0000 Encounter for general adult medical examination without abnormal findings: Secondary | ICD-10-CM | POA: Diagnosis not present

## 2015-04-10 DIAGNOSIS — R778 Other specified abnormalities of plasma proteins: Secondary | ICD-10-CM

## 2015-04-10 DIAGNOSIS — I10 Essential (primary) hypertension: Secondary | ICD-10-CM | POA: Diagnosis not present

## 2015-04-10 MED ORDER — LISINOPRIL 20 MG PO TABS: 20.0000 mg | ORAL_TABLET | Freq: Every day | ORAL | Status: DC

## 2015-04-10 MED ORDER — LABETALOL HCL 100 MG PO TABS: 100.0000 mg | ORAL_TABLET | Freq: Two times a day (BID) | ORAL | Status: DC

## 2015-04-10 MED ORDER — CETIRIZINE HCL 10 MG PO TABS: 10.0000 mg | ORAL_TABLET | Freq: Every day | ORAL | Status: DC

## 2015-04-10 MED ORDER — HYDRALAZINE HCL 50 MG PO TABS: 50.0000 mg | ORAL_TABLET | Freq: Three times a day (TID) | ORAL | Status: DC

## 2015-04-10 MED ORDER — CLONIDINE HCL 0.2 MG PO TABS: 0.2000 mg | ORAL_TABLET | Freq: Three times a day (TID) | ORAL | Status: DC

## 2015-04-10 NOTE — Patient Instructions (Addendum)
Emily Key was seen today for establish care and hypertension.  Diagnoses and all orders for this visit:  Healthcare maintenance -     Flu Vaccine QUAD 36+ mos IM  Elevated troponin -     Troponin I  Empyema lung -     DG Chest 2 View; Future  Essential hypertension -     lisinopril (PRINIVIL,ZESTRIL) 20 MG tablet; Take 1 tablet (20 mg total) by mouth daily. -     labetalol (NORMODYNE) 100 MG tablet; Take 1 tablet (100 mg total) by mouth 2 (two) times daily. -     hydrALAZINE (APRESOLINE) 50 MG tablet; Take 1 tablet (50 mg total) by mouth every 8 (eight) hours. -     cloNIDine (CATAPRES) 0.2 MG tablet; Take 1 tablet (0.2 mg total) by mouth 3 (three) times daily.  Seasonal allergies -     cetirizine (ZYRTEC) 10 MG tablet; Take 1 tablet (10 mg total) by mouth daily.   F/u in 6-8 weeks for pap smear  Dr. Adrian Blackwater

## 2015-04-10 NOTE — Progress Notes (Signed)
Subjective:  Patient ID: Emily Key, female    DOB: 09-Jan-1971  Age: 44 y.o. MRN: [id number]  CC: Establish Care and Hypertension   HPI Rossi Silvestro presents for   1. CHRONIC HYPERTENSION  Disease Monitoring  Blood pressure range: not checking   Chest pain: no   Dyspnea: no   Claudication: no   Medication compliance: yes, but out of two meds for the past 2 days   Medication Side Effects  Lightheadedness: no   Urinary frequency: no   Edema: no   2. NSTEMI with trop elevation in setting of HTN: no CP with exertion. Compliant with BP medications.   3. Empyema: treated with Abx. No cough, CP or SOB.   Social History  Substance Use Topics  . Smoking status: Former Smoker -- 0.33 packs/day for 10 years    Types: Cigarettes    Quit date: 11/11/2014  . Smokeless tobacco: Never Used  . Alcohol Use: 0.0 oz/week    0 Standard drinks or equivalent per week     Comment: 8/1/20176 "a couple beers a couple times/month"   Outpatient Prescriptions Prior to Visit  Medication Sig Dispense Refill  . cloNIDine (CATAPRES) 0.2 MG tablet Take 1 tablet (0.2 mg total) by mouth 3 (three) times daily. 90 tablet 3  . hydrALAZINE (APRESOLINE) 50 MG tablet Take 1 tablet (50 mg total) by mouth every 8 (eight) hours. 90 tablet 0  . labetalol (NORMODYNE) 100 MG tablet Take 1 tablet (100 mg total) by mouth 2 (two) times daily. 60 tablet 0  . lisinopril (PRINIVIL,ZESTRIL) 20 MG tablet Take 1 tablet (20 mg total) by mouth daily. 30 tablet 3   No facility-administered medications prior to visit.    ROS Review of Systems  Constitutional: Negative for fever and chills.  Eyes: Negative for visual disturbance.  Respiratory: Negative for shortness of breath.   Cardiovascular: Negative for chest pain.  Gastrointestinal: Negative for abdominal pain and blood in stool.  Musculoskeletal: Negative for back pain and arthralgias.  Skin: Negative for rash.  Allergic/Immunologic: Negative for  immunocompromised state.  Neurological: Positive for headaches.  Hematological: Negative for adenopathy. Does not bruise/bleed easily.  Psychiatric/Behavioral: Negative for suicidal ideas and dysphoric mood.  GAD-7: score of 6. 1-2,4,6. 2-3 all others 0.   Objective:  BP 135/91 mmHg  Pulse 96  Temp(Src) 98.8 F (37.1 C) (Oral)  Resp 16  Ht 5\' 8"  (1.727 m)  Wt 193 lb (87.544 kg)  BMI 29.35 kg/m2  SpO2 98%  BP/Weight 04/10/2015 02/25/2015 6/72/0947  Systolic BP 096 283 662  Diastolic BP 91 82 947  Wt. (Lbs) 193 195 193  BMI 29.35 29.66 29.35    Physical Exam  Constitutional: She is oriented to person, place, and time. She appears well-developed and well-nourished. No distress.  HENT:  Head: Normocephalic and atraumatic.  Cardiovascular: Normal rate, regular rhythm, normal heart sounds and intact distal pulses.   Pulmonary/Chest: Effort normal and breath sounds normal.  Musculoskeletal: She exhibits no edema.  Neurological: She is alert and oriented to person, place, and time.  Skin: Skin is warm and dry. No rash noted.  Psychiatric: She has a normal mood and affect.   Assessment & Plan:   Problem List Items Addressed This Visit    Elevated troponin   Relevant Orders   Troponin I   Empyema lung   Relevant Orders   DG Chest 2 View   Hypertension (Chronic)   Relevant Medications   lisinopril (PRINIVIL,ZESTRIL) 20 MG tablet  labetalol (NORMODYNE) 100 MG tablet   hydrALAZINE (APRESOLINE) 50 MG tablet   cloNIDine (CATAPRES) 0.2 MG tablet    Other Visit Diagnoses    Healthcare maintenance    -  Primary    Relevant Orders    Flu Vaccine QUAD 36+ mos IM (Completed)    Seasonal allergies        Relevant Medications    cetirizine (ZYRTEC) 10 MG tablet       No orders of the defined types were placed in this encounter.    Follow-up: No Follow-up on file.   Boykin Nearing MD

## 2015-04-10 NOTE — Progress Notes (Signed)
F/U HTN Establish Care with new pcp

## 2015-04-11 LAB — TROPONIN I: Troponin I: 0.03 ng/mL (ref <0.06)

## 2016-12-08 ENCOUNTER — Encounter: Payer: Medicaid Other | Admitting: Internal Medicine

## 2019-01-02 ENCOUNTER — Encounter (HOSPITAL_COMMUNITY): Payer: Self-pay

## 2019-01-02 ENCOUNTER — Observation Stay (HOSPITAL_COMMUNITY): Payer: Self-pay

## 2019-01-02 ENCOUNTER — Inpatient Hospital Stay (HOSPITAL_COMMUNITY)
Admission: EM | Admit: 2019-01-02 | Discharge: 2019-01-05 | DRG: 304 | Disposition: A | Discharge status: Home / Self Care | Payer: Self-pay | Attending: Internal Medicine | Admitting: Internal Medicine

## 2019-01-02 ENCOUNTER — Emergency Department (HOSPITAL_COMMUNITY): Payer: Self-pay

## 2019-01-02 ENCOUNTER — Other Ambulatory Visit: Payer: Self-pay

## 2019-01-02 DIAGNOSIS — Z8249 Family history of ischemic heart disease and other diseases of the circulatory system: Secondary | ICD-10-CM

## 2019-01-02 DIAGNOSIS — I161 Hypertensive emergency: Principal | ICD-10-CM

## 2019-01-02 DIAGNOSIS — G4089 Other seizures: Secondary | ICD-10-CM | POA: Diagnosis present

## 2019-01-02 DIAGNOSIS — N179 Acute kidney failure, unspecified: Secondary | ICD-10-CM | POA: Diagnosis present

## 2019-01-02 DIAGNOSIS — R569 Unspecified convulsions: Secondary | ICD-10-CM

## 2019-01-02 DIAGNOSIS — Z8673 Personal history of transient ischemic attack (TIA), and cerebral infarction without residual deficits: Secondary | ICD-10-CM

## 2019-01-02 DIAGNOSIS — R739 Hyperglycemia, unspecified: Secondary | ICD-10-CM | POA: Diagnosis present

## 2019-01-02 DIAGNOSIS — I5043 Acute on chronic combined systolic (congestive) and diastolic (congestive) heart failure: Secondary | ICD-10-CM | POA: Diagnosis present

## 2019-01-02 DIAGNOSIS — I160 Hypertensive urgency: Secondary | ICD-10-CM

## 2019-01-02 DIAGNOSIS — K5900 Constipation, unspecified: Secondary | ICD-10-CM | POA: Diagnosis present

## 2019-01-02 DIAGNOSIS — Z801 Family history of malignant neoplasm of trachea, bronchus and lung: Secondary | ICD-10-CM

## 2019-01-02 DIAGNOSIS — I472 Ventricular tachycardia: Secondary | ICD-10-CM | POA: Diagnosis present

## 2019-01-02 DIAGNOSIS — J9690 Respiratory failure, unspecified, unspecified whether with hypoxia or hypercapnia: Secondary | ICD-10-CM

## 2019-01-02 DIAGNOSIS — R9401 Abnormal electroencephalogram [EEG]: Secondary | ICD-10-CM | POA: Diagnosis present

## 2019-01-02 DIAGNOSIS — I5023 Acute on chronic systolic (congestive) heart failure: Secondary | ICD-10-CM | POA: Diagnosis present

## 2019-01-02 DIAGNOSIS — I674 Hypertensive encephalopathy: Secondary | ICD-10-CM | POA: Diagnosis present

## 2019-01-02 DIAGNOSIS — Z1159 Encounter for screening for other viral diseases: Secondary | ICD-10-CM

## 2019-01-02 DIAGNOSIS — I130 Hypertensive heart and chronic kidney disease with heart failure and stage 1 through stage 4 chronic kidney disease, or unspecified chronic kidney disease: Secondary | ICD-10-CM | POA: Diagnosis present

## 2019-01-02 DIAGNOSIS — F17200 Nicotine dependence, unspecified, uncomplicated: Secondary | ICD-10-CM | POA: Diagnosis present

## 2019-01-02 DIAGNOSIS — Z833 Family history of diabetes mellitus: Secondary | ICD-10-CM

## 2019-01-02 DIAGNOSIS — I252 Old myocardial infarction: Secondary | ICD-10-CM

## 2019-01-02 DIAGNOSIS — F1210 Cannabis abuse, uncomplicated: Secondary | ICD-10-CM | POA: Diagnosis present

## 2019-01-02 DIAGNOSIS — Z87891 Personal history of nicotine dependence: Secondary | ICD-10-CM

## 2019-01-02 DIAGNOSIS — I371 Nonrheumatic pulmonary valve insufficiency: Secondary | ICD-10-CM

## 2019-01-02 DIAGNOSIS — N189 Chronic kidney disease, unspecified: Secondary | ICD-10-CM | POA: Diagnosis present

## 2019-01-02 DIAGNOSIS — R1013 Epigastric pain: Secondary | ICD-10-CM | POA: Diagnosis present

## 2019-01-02 DIAGNOSIS — Z9114 Patient's other noncompliance with medication regimen: Secondary | ICD-10-CM

## 2019-01-02 HISTORY — DX: Unspecified convulsions: R56.9

## 2019-01-02 HISTORY — DX: Hypertensive emergency: I16.1

## 2019-01-02 LAB — URINALYSIS, ROUTINE W REFLEX MICROSCOPIC
Bacteria, UA: NONE SEEN
Bilirubin Urine: NEGATIVE
Glucose, UA: 150 mg/dL — AB
Ketones, ur: NEGATIVE mg/dL
Leukocytes,Ua: NEGATIVE
Nitrite: NEGATIVE
Protein, ur: >=300 mg/dL — AB
Specific Gravity, Urine: 1.012 (ref 1.005–1.030)
pH: 6 (ref 5.0–8.0)

## 2019-01-02 LAB — CBC WITH DIFFERENTIAL/PLATELET
Abs Immature Granulocytes: 0.01 10*3/uL (ref 0.00–0.07)
Basophils Absolute: 0.1 10*3/uL (ref 0.0–0.1)
Basophils Relative: 1 %
Eosinophils Absolute: 0.3 10*3/uL (ref 0.0–0.5)
Eosinophils Relative: 4 %
HCT: 46.1 % — ABNORMAL HIGH (ref 36.0–46.0)
Hemoglobin: 14.5 g/dL (ref 12.0–15.0)
Immature Granulocytes: 0 %
Lymphocytes Relative: 19 %
Lymphs Abs: 1.4 10*3/uL (ref 0.7–4.0)
MCH: 27 pg (ref 26.0–34.0)
MCHC: 31.5 g/dL (ref 30.0–36.0)
MCV: 85.8 fL (ref 80.0–100.0)
Monocytes Absolute: 0.3 10*3/uL (ref 0.1–1.0)
Monocytes Relative: 4 %
Neutro Abs: 5.4 10*3/uL (ref 1.7–7.7)
Neutrophils Relative %: 72 %
Platelets: 248 10*3/uL (ref 150–400)
RBC: 5.37 MIL/uL — ABNORMAL HIGH (ref 3.87–5.11)
RDW: 13.9 % (ref 11.5–15.5)
WBC: 7.5 10*3/uL (ref 4.0–10.5)
nRBC: 0 % (ref 0.0–0.2)

## 2019-01-02 LAB — BASIC METABOLIC PANEL
Anion gap: 13 (ref 5–15)
BUN: 16 mg/dL (ref 6–20)
CO2: 19 mmol/L — ABNORMAL LOW (ref 22–32)
Calcium: 9.1 mg/dL (ref 8.9–10.3)
Chloride: 105 mmol/L (ref 98–111)
Creatinine, Ser: 1.12 mg/dL — ABNORMAL HIGH (ref 0.44–1.00)
GFR calc Af Amer: >60 mL/min (ref >60)
GFR calc non Af Amer: 58 mL/min — ABNORMAL LOW (ref >60)
Glucose, Bld: 165 mg/dL — ABNORMAL HIGH (ref 70–99)
Potassium: 3.5 mmol/L (ref 3.5–5.1)
Sodium: 137 mmol/L (ref 135–145)

## 2019-01-02 LAB — MRSA PCR SCREENING: MRSA by PCR: NEGATIVE

## 2019-01-02 LAB — SARS CORONAVIRUS 2 BY RT PCR (HOSPITAL ORDER, PERFORMED IN ~~LOC~~ HOSPITAL LAB): SARS Coronavirus 2: NEGATIVE

## 2019-01-02 LAB — CREATININE, SERUM
Creatinine, Ser: 1.04 mg/dL — ABNORMAL HIGH (ref 0.44–1.00)
GFR calc Af Amer: >60 mL/min (ref >60)
GFR calc non Af Amer: >60 mL/min (ref >60)

## 2019-01-02 LAB — CBC
HCT: 47.6 % — ABNORMAL HIGH (ref 36.0–46.0)
Hemoglobin: 15.7 g/dL — ABNORMAL HIGH (ref 12.0–15.0)
MCH: 27.1 pg (ref 26.0–34.0)
MCHC: 33 g/dL (ref 30.0–36.0)
MCV: 82.2 fL (ref 80.0–100.0)
Platelets: 263 10*3/uL (ref 150–400)
RBC: 5.79 MIL/uL — ABNORMAL HIGH (ref 3.87–5.11)
RDW: 14 % (ref 11.5–15.5)
WBC: 8.5 10*3/uL (ref 4.0–10.5)
nRBC: 0 % (ref 0.0–0.2)

## 2019-01-02 LAB — RAPID URINE DRUG SCREEN, HOSP PERFORMED
Amphetamines: NOT DETECTED
Barbiturates: NOT DETECTED
Benzodiazepines: NOT DETECTED
Cocaine: NOT DETECTED
Opiates: NOT DETECTED
Tetrahydrocannabinol: POSITIVE — AB

## 2019-01-02 LAB — ECHOCARDIOGRAM COMPLETE
Height: 68 in
Weight: 3040 oz

## 2019-01-02 LAB — HEMOGLOBIN A1C
Hgb A1c MFr Bld: 5.7 % — ABNORMAL HIGH (ref 4.8–5.6)
Mean Plasma Glucose: 116.89 mg/dL

## 2019-01-02 MED ORDER — NICOTINE 14 MG/24HR TD PT24
14.0000 mg | MEDICATED_PATCH | Freq: Every day | TRANSDERMAL | Status: DC
  Administered 2019-01-02 – 2019-01-05 (×4): 14 mg via TRANSDERMAL
  Filled 2019-01-02 (×4): qty 1

## 2019-01-02 MED ORDER — LABETALOL HCL 5 MG/ML IV SOLN
10.0000 mg | INTRAVENOUS | Status: DC | PRN
  Administered 2019-01-02 (×2): 10 mg via INTRAVENOUS
  Filled 2019-01-02 (×2): qty 4

## 2019-01-02 MED ORDER — DOCUSATE SODIUM 100 MG PO CAPS
100.0000 mg | ORAL_CAPSULE | Freq: Two times a day (BID) | ORAL | Status: DC
  Administered 2019-01-02 – 2019-01-05 (×7): 100 mg via ORAL
  Filled 2019-01-02 (×7): qty 1

## 2019-01-02 MED ORDER — IOHEXOL 300 MG/ML  SOLN
100.0000 mL | Freq: Once | INTRAMUSCULAR | Status: AC | PRN
  Administered 2019-01-02: 100 mL via INTRAVENOUS

## 2019-01-02 MED ORDER — CARVEDILOL 6.25 MG PO TABS
6.2500 mg | ORAL_TABLET | Freq: Two times a day (BID) | ORAL | Status: DC
  Administered 2019-01-02 – 2019-01-04 (×4): 6.25 mg via ORAL
  Filled 2019-01-02 (×4): qty 1

## 2019-01-02 MED ORDER — HYDRALAZINE HCL 20 MG/ML IJ SOLN
20.0000 mg | Freq: Once | INTRAMUSCULAR | Status: AC
  Administered 2019-01-02: 20 mg via INTRAVENOUS
  Filled 2019-01-02: qty 1

## 2019-01-02 MED ORDER — ONDANSETRON HCL 4 MG/2ML IJ SOLN
4.0000 mg | Freq: Four times a day (QID) | INTRAMUSCULAR | Status: DC | PRN
  Administered 2019-01-03: 4 mg via INTRAVENOUS
  Filled 2019-01-02: qty 2

## 2019-01-02 MED ORDER — ACETAMINOPHEN 325 MG PO TABS
650.0000 mg | ORAL_TABLET | ORAL | Status: DC | PRN
  Administered 2019-01-02 – 2019-01-04 (×6): 650 mg via ORAL
  Filled 2019-01-02 (×6): qty 2

## 2019-01-02 MED ORDER — ENOXAPARIN SODIUM 40 MG/0.4ML ~~LOC~~ SOLN
40.0000 mg | SUBCUTANEOUS | Status: DC
  Administered 2019-01-02 – 2019-01-04 (×3): 40 mg via SUBCUTANEOUS
  Filled 2019-01-02 (×2): qty 0.4

## 2019-01-02 MED ORDER — FUROSEMIDE 10 MG/ML IJ SOLN
20.0000 mg | Freq: Two times a day (BID) | INTRAMUSCULAR | Status: DC
  Administered 2019-01-02 – 2019-01-04 (×4): 20 mg via INTRAVENOUS
  Filled 2019-01-02: qty 4
  Filled 2019-01-02 (×3): qty 2

## 2019-01-02 MED ORDER — LORAZEPAM 2 MG/ML IJ SOLN: 1.0000 mg | INTRAMUSCULAR | Status: DC | PRN

## 2019-01-02 MED ORDER — LABETALOL HCL 5 MG/ML IV SOLN
10.0000 mg | Freq: Once | INTRAVENOUS | Status: AC
  Administered 2019-01-02: 10 mg via INTRAVENOUS
  Filled 2019-01-02: qty 4

## 2019-01-02 MED ORDER — HYDRALAZINE HCL 20 MG/ML IJ SOLN: 10.0000 mg | Freq: Once | INTRAMUSCULAR | Status: DC

## 2019-01-02 MED ORDER — POLYETHYLENE GLYCOL 3350 17 G PO PACK: 17.0000 g | PACK | Freq: Every day | ORAL | Status: DC | PRN

## 2019-01-02 MED ORDER — ACETAMINOPHEN 650 MG RE SUPP: 650.0000 mg | RECTAL | Status: DC | PRN

## 2019-01-02 MED ORDER — LABETALOL HCL 5 MG/ML IV SOLN: 10.0000 mg | Freq: Once | INTRAVENOUS | Status: DC

## 2019-01-02 MED ORDER — NICARDIPINE HCL IN NACL 20-0.86 MG/200ML-% IV SOLN
3.0000 mg/h | INTRAVENOUS | Status: DC
  Administered 2019-01-02 – 2019-01-03 (×3): 3 mg/h via INTRAVENOUS
  Filled 2019-01-02 (×3): qty 200

## 2019-01-02 MED ORDER — BISACODYL 10 MG RE SUPP
10.0000 mg | Freq: Once | RECTAL | Status: AC
  Administered 2019-01-02: 10 mg via RECTAL
  Filled 2019-01-02: qty 1

## 2019-01-02 MED ORDER — ENOXAPARIN SODIUM 40 MG/0.4ML ~~LOC~~ SOLN
40.0000 mg | SUBCUTANEOUS | Status: DC
  Filled 2019-01-02 (×2): qty 0.4

## 2019-01-02 MED ORDER — ONDANSETRON HCL 4 MG PO TABS: 4.0000 mg | ORAL_TABLET | Freq: Four times a day (QID) | ORAL | Status: DC | PRN

## 2019-01-02 MED ORDER — LISINOPRIL 20 MG PO TABS
20.0000 mg | ORAL_TABLET | Freq: Every day | ORAL | Status: DC
  Administered 2019-01-02 – 2019-01-04 (×3): 20 mg via ORAL
  Filled 2019-01-02 (×3): qty 1

## 2019-01-02 MED ORDER — HYDRALAZINE HCL 20 MG/ML IJ SOLN
10.0000 mg | Freq: Once | INTRAMUSCULAR | Status: AC
  Administered 2019-01-02: 10 mg via INTRAVENOUS
  Filled 2019-01-02: qty 1

## 2019-01-02 MED ORDER — ALBUTEROL SULFATE (2.5 MG/3ML) 0.083% IN NEBU: 2.5000 mg | INHALATION_SOLUTION | RESPIRATORY_TRACT | Status: DC | PRN

## 2019-01-02 NOTE — H&P (Signed)
History and Physical    Emily Key:[id number] DOB: 10-15-1970 DOA: 01/02/2019  PCP: Patient, No Pcp Per - none since 2016 Consultants:  None Patient coming from:  Home - lives with son, sister, niece, niece's son; NOK: Sylvie Farrier, [phone] (?)  Chief Complaint: Seizures  HPI: Emily Key is a 48 y.o. 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  When asked why she came to the ER, she reported, "I don't know.  I went to sleep and woke up here.  They said I had a seizure."  She reports being hospitalized here in 2016 and had fluid around her lung, requiring chest tubes x 3.  She also reports that she has had increased SOB, concern for fluid in her left chest, and breathing difficulties.  She has had difficulty sleeping.  Her brother died in 11-26-22 and she has had symptoms since then.  She has had DOE, 3-pillow orthopnea, +PND, no LE edema.  She has had weight loss.  She does report uncontrolled HTN at home with SBPs as high as >200 once and generally in the "high 100s".  She denies h/o seizures.  She reports feeling okay now, although with mild left frontal headache.    ED Course:  Carryover, per Dr. Hal Hope:  48 year old female with history of hypertension who has not been taking her antihypertensive for more than a year was witnessed to have a generalized tonic-clonic seizure following which patient became postictal and was brought to the ER. In the ER patient is found to be markedly hypertensive was given IV labetalol which blood pressure is presently around 1 27-0 90 systolic. CT head was showing nonspecific findings. Dr. Leonel Ramsay neurologist at this time advised to get MRI brain and admitted for further observation.  8/16 echo with EF 40-45% and grade 2 diastolic dysfunction.  Normal cath on 02/13/15.  She was admitted then for sepsis and HTN urgency at that time.  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  She required ICU admission with Cardene drip in the ICU.    Review of Systems: As per HPI; otherwise review of systems reviewed and negative.   Ambulatory Status:  Ambulates without assistance  Past Medical History:  Diagnosis Date   CHF (congestive heart failure) ([place])    after last pregnancy   Daily headache    Heart murmur    "born w/one":   History of hiatal hernia    Hypertension    Pneumonia 11/2014    Past Surgical History:  Procedure Laterality Date   CARDIAC CATHETERIZATION N/A 02/13/2015   Procedure: Left Heart Cath and Coronary Angiography;  Surgeon: Troy Sine, MD;  Location: [facility];  Service: Cardiovascular;  Laterality: N/A;   PLEURAL EFFUSION DRAINAGE Left 11/14/2014   Procedure: DRAINAGE OF LEFT PLEURAL EFFUSION;  Surgeon: Melrose Nakayama, MD;  Location: [facility];  Service: Thoracic;  Laterality: Left;   TONSILLECTOMY AND ADENOIDECTOMY  ~ [place]   VIDEO ASSISTED THORACOSCOPY (VATS)/DECORTICATION Left 11/14/2014   Procedure: LEFT VIDEO ASSISTED THORACOSCOPY (VATS)/DECORTICATION;  Surgeon: Melrose Nakayama, MD;  [facility];  Service: Thoracic;  Laterality: Left;    Social History   Socioeconomic History   Marital status: Divorced    Spouse name: Not on file   Number of children: Not on file   Years of education: Not on file   Highest  education level: Not on file  Occupational History   Not on file  Social Needs   Financial resource strain: Not on file   Food insecurity    Worry: Not on file    Inability: Not on file   Transportation needs    Medical: Not on file    Non-medical: Not on file  Tobacco Use   Smoking status: Former Smoker    Packs/day: 0.33    Years: 10.00    Pack years: 3.30    Types: Cigarettes    Quit date: 11/11/2014    Years since quitting: 4.1   Smokeless tobacco: Never Used  Substance and  Sexual Activity   Alcohol use: Yes    Alcohol/week: 0.0 standard drinks    Comment: 8/1/20176 "a couple beers a couple times/month"   Drug use: No   Sexual activity: Yes  Lifestyle   Physical activity    Days per week: Not on file    Minutes per session: Not on file   Stress: Not on file  Relationships   Social connections    Talks on phone: Not on file    Gets together: Not on file    Attends religious service: Not on file    Active member of club or organization: Not on file    Attends meetings of clubs or organizations: Not on file    Relationship status: Not on file   Intimate partner violence    Fear of current or ex partner: Not on file    Emotionally abused: Not on file    Physically abused: Not on file    Forced sexual activity: Not on file  Other Topics Concern   Not on file  Social History Narrative   Not on file    No Known Allergies  Family History  Problem Relation Age of Onset   Diabetes Mellitus II Mother    Hypertension Mother    Lung cancer Father     Prior to Admission medications   Medication Sig Start Date End Date Taking? Authorizing Provider  cetirizine (ZYRTEC) 10 MG tablet Take 1 tablet (10 mg total) by mouth daily. Patient not taking: Reported on 01/02/2019 04/10/15   Boykin Nearing, MD  cloNIDine (CATAPRES) 0.2 MG tablet Take 1 tablet (0.2 mg total) by mouth 3 (three) times daily. Patient not taking: Reported on 01/02/2019 04/10/15   Boykin Nearing, MD  hydrALAZINE (APRESOLINE) 50 MG tablet Take 1 tablet (50 mg total) by mouth every 8 (eight) hours. Patient not taking: Reported on 01/02/2019 04/10/15   Boykin Nearing, MD  labetalol (NORMODYNE) 100 MG tablet Take 1 tablet (100 mg total) by mouth 2 (two) times daily. Patient not taking: Reported on 01/02/2019 04/10/15   Boykin Nearing, MD  lisinopril (PRINIVIL,ZESTRIL) 20 MG tablet Take 1 tablet (20 mg total) by mouth daily. Patient not taking: Reported on 01/02/2019 04/10/15    Boykin Nearing, MD    Physical Exam: Vitals:   01/02/19 1045 01/02/19 1100 01/02/19 1115 01/02/19 1130  BP: (!) 184/95 (!) 189/97 (!) 190/98 (!) 193/111  Pulse: 61 63 62 65  Resp: (!) 24 (!) 26 18 (!) 25  Temp:      TempSrc:      SpO2: 100% 100% 100% 100%  Weight:      Height:          General:  Appears calm and comfortable and is NAD  Eyes:  PERRL, EOMI, normal lids, iris  ENT:  grossly normal hearing, lips & tongue,  mmm; appropriate dentition  Neck:  no LAD, masses or thyromegaly; no carotid bruits  Cardiovascular:  RRR, no m/r/g. No LE edema.   Respiratory:   CTA bilaterally with no wheezes/rales/rhonchi.  Normal respiratory effort.  Abdomen:  soft, point TTP in LUQ, ND, NABS  Back:   normal alignment, no CVAT  Skin:  no rash or induration seen on limited exam  Musculoskeletal:  grossly normal tone BUE/BLE, good ROM, no bony abnormality  Psychiatric:  grossly normal mood and affect, speech fluent and appropriate, AOx3  Neurologic:  CN 2-12 grossly intact, moves all extremities in coordinated fashion, sensation intact    Radiological Exams on Admission: Ct Head Wo Contrast  Result Date: 01/02/2019 CLINICAL DATA:  Seizure, new, nontraumatic EXAM: CT HEAD WITHOUT CONTRAST TECHNIQUE: Contiguous axial images were obtained from the base of the skull through the vertex without intravenous contrast. COMPARISON:  05/19/2006 FINDINGS: Brain: Interval but chronic appearing right occipital infarct that is moderate size. There is artifact versus age-indeterminate ischemia in the left occipital cortex where the gray-white differentiation is lost. Remote appearing lacunar infarct in the left caudate head and left thalamus. No hemorrhage, hydrocephalus, or masslike finding. Vascular: No hyperdense vessel. Degree of tortuosity at the circle-of-Willis Skull: Negative Sinuses/Orbits: Negative IMPRESSION: 1. Artifact versus age-indeterminate ischemia at the left occipital cortex. 2.  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 Electronically Signed   By: Monte Fantasia M.D.   On: 01/02/2019 05:37   Mr Brain Wo Contrast  Result Date: 01/02/2019 CLINICAL DATA:  Seizure, new, abnormal neuro exam. EXAM: MRI HEAD WITHOUT CONTRAST TECHNIQUE: Multiplanar, multiecho pulse sequences of the brain and surrounding structures were obtained without intravenous contrast. COMPARISON:  Head CT from earlier today FINDINGS: Brain: No acute infarction, hemorrhage, hydrocephalus, extra-axial collection or mass lesion. Small to moderate remote cortical infarct in the paramedian right occipital lobe. Remote lacunar infarcts in the left caudate head and left thalamus. Chronic small vessel ischemic gliosis in the cerebral white matter. No acute hemorrhage, hydrocephalus, mass, or collection. Vascular: Tortuous vessels likely from chronic hypertension. Major flow voids are preserved Skull and upper cervical spine: Negative for marrow lesion Sinuses/Orbits: Negative IMPRESSION: 1. No emergent finding. Left occipital appearance on head CT was artifactual. 2. Chronic ischemic injury including remote right occipital cortex and left deep gray nuclei infarcts. Electronically Signed   By: Monte Fantasia M.D.   On: 01/02/2019 07:41   Ct Abdomen Pelvis W Contrast  Result Date: 01/02/2019 CLINICAL DATA:  LEFT upper quadrant abdominal pain for 2 days, history CHF, hypertension, former smoker EXAM: CT ABDOMEN AND PELVIS WITH CONTRAST TECHNIQUE: Multidetector CT imaging of the abdomen and pelvis was performed using the standard protocol following bolus administration of intravenous contrast. Sagittal and coronal MPR images reconstructed from axial data set. CONTRAST:  178mL OMNIPAQUE IOHEXOL 300 MG/ML SOLN IV. No oral contrast. COMPARISON:  02/09/2015 FINDINGS: Lower chest: Minimal bibasilar atelectasis Hepatobiliary: Liver and gallbladder normal appearance Pancreas: Normal appearance  Spleen: Normal appearance Adrenals/Urinary Tract: Adrenal glands normal appearance. Multiple BILATERAL nonobstructing small renal calculi. No renal mass or hydronephrosis. Bladder and ureters normal appearance. Stomach/Bowel: Normal appendix, extends posterior to RIGHT psoas stomach and bowel loops normal appearance Vascular/Lymphatic: Atherosclerotic calcification aorta without aneurysm. No adenopathy. Reproductive: Question prior tubal ligation. Uterus and ovaries otherwise unremarkable. Other: Minimal free pelvic fluid. No free air. No hernia or definite inflammatory process. Musculoskeletal: Osseous structures unremarkable. IMPRESSION: Multiple BILATERAL nonobstructing small renal calculi. No acute intra-abdominal or intrapelvic  abnormalities visualized. Electronically Signed   By: Lavonia Dana M.D.   On: 01/02/2019 12:23   Dg Chest Port 1 View  Result Date: 01/02/2019 CLINICAL DATA:  Hypertensive urgency. EXAM: PORTABLE CHEST 1 VIEW COMPARISON:  CT 02/14/2015.  Chest x-ray 02/09/2015, 12/03/2014. FINDINGS: Mediastinum hilar structures normal. Cardiomegaly. Mild pulmonary venous congestion. Mild bilateral interstitial prominence with Kerley B-lines noted. Mild component CHF may be present. Tiny left pleural effusion cannot be excluded. No pneumothorax. No acute bony abnormality. IMPRESSION: Cardiomegaly with mild pulmonary venous congestion and bilateral interstitial prominence. Findings suggest mild CHF. Small left pleural effusion versus pleural scarring. Electronically Signed   By: Marcello Moores  Register   On: 01/02/2019 12:32    EKG: Independently reviewed.  NSR with rate 67; LVH; nonspecific ST changes with no evidence of acute ischemia   Labs on Admission: I have personally reviewed the available labs and imaging studies at the time of the admission.  Pertinent labs:   CO2 19 Glucose 165 BUN 16/Creatinine 1.12/GFR >60 Unremarkable CBC UA: 150 glucose, small Hgb, >300 protein UDS: +THC COVID  pending  Assessment/Plan Active Problems:   Hypertensive emergency   Marijuana abuse   Tobacco dependence   Hyperglycemia   Acute on chronic combined systolic and diastolic CHF (congestive heart failure) (HCC)   Seizure (HCC)   Hypertensive crisis -Patient presenting with new-onset seizure thought to be due to hypertensive emergency -She reports long-standing medication non-compliance and poor BP control chronically -She describes symptoms concerning for CHF exacerbation recently and then had reported seizure activity overnight -Her only persistent symptom at the time of my evaluation - besides persistent, uncontrolled HTN, was a headache -She has not responded well thus far to labetalol and continues to have SBP that has not decreased by the goal 10-15% initially and up to 25% the first day -I have added IV hydralazine as well as PO Coreg and Lisinopril -If this is insufficient to improve BP, she may require ICU admission with a Cardene drip; I have consulted PCCM -There was previously concern for pheochromocytoma and plasma metanephrines were ordered but testing was inconclusive and repeat testing was recommended but does not appear to have occurred -Fractionated catecholamines were elevated at that time -Will repeat those tests now -Additionally, she had significant LUQ TTP (unexpectedly) and so STAT abdominal/pevlic CT with contrast was requested and adrenals were normal in that study  Acute on chronic combined CHF  -Patient with prior h/o combined CHF on Echo in 2016, none since -She presented with symptoms very concerning for CHF - significant orthopnea, DOE, PND -CXR suggests mild CHF -No current suspicion for ACS -EKG not concerning for acute ischemia -Will order echo -ACE and BB ordered, as above -Will f/u Echo results to decide whether cardiology consult is needed -Will start Lasix 20 mg IV BID for now  Seizure -Patient has been seen by neurology and diagnosed with  hypertensive emergency -Seizure is thought to have been provoked by uncontrolled HTN -AEDs are not recommended at this time -MRI is negative for PRES -Eventual BP goal is <140, according to neurology -Seizure precautions are recommended  Hyperglycemia -Glucose 165 -A1c is 5.7, so patient appears to be pre-diabetic -Will follow glucose with daily labs for now  Marijuana abuse -Cessation encouraged; this should be encouraged on an ongoing basis -UDS ordered and negative other than THC  Tobacco dependence -TEncourage cessation.   -This was discussed with the patient and should be reviewed on an ongoing basis.   -Patch ordered  Note: This patient has been tested and is negative for the novel coronavirus COVID-19.  DVT prophylaxis:  Lovenox  Code Status:  Full  Family Communication: None present  Disposition Plan:  Home once clinically improved Consults called: Neurology; PCCM  Admission status:  It is my clinical opinion that referral for OBSERVATION is reasonable and necessary in this patient based on the above information provided. The aforementioned taken together are felt to place the patient at high risk for further clinical deterioration. However it is anticipated that the patient may be medically stable for discharge from the hospital within 24 to 48 hours.    Total critical care time: 65 minutes Critical care time was exclusive of separately billable procedures and treating other patients. Critical care was necessary to treat or prevent imminent or life-threatening deterioration. Critical care was time spent personally by me on the following activities: development of treatment plan with patient and/or surrogate as well as nursing, discussions with consultants, evaluation of patient's response to treatment, examination of patient, obtaining history from patient or surrogate, ordering and performing treatments and interventions, ordering and review of laboratory studies,  ordering and review of radiographic studies, pulse oximetry and re-evaluation of patient's condition.     Karmen Bongo MD [facility]   How to contact the [facility] Attending or Consulting provider [place] or covering provider during after hours [place], for this patient?  1. Check the care team in [facility] and look for a) attending/consulting [facility] provider listed and b) the [facility] team listed 2. Log into [email or web address] and use 's universal password to access. If you do not have the password, please contact the hospital operator. 3. Locate the [facility] provider you are looking for under [facility] and page to a number that you can be directly reached. 4. If you still have difficulty reaching the provider, please page the [facility] (Director on Call) for the Hospitalists listed on amion for assistance.   01/02/2019, 1:04 PM

## 2019-01-02 NOTE — ED Notes (Signed)
Patient transported to MRI 

## 2019-01-02 NOTE — Procedures (Signed)
EEG Report  Clinical History:  Witnessed generalized seizure.  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  Technical Summary:  A 19 channel digital EEG recording was performed using the 10-20 international system of electrode placement.  Bipolar and Referential montages were used.  The total recording time was approx 20 minutes.  Findings:  There is a posterior dominant rhythm of 9-10 Hz reactive to eye opening and closure.  There is intermittent theta frequency focal slowing in the right posterior temporal and occipital areas. On three occasions, there are epileptiform discharges with maximal negativity at T6.  These do not evolve into electrographic seizures.  Sleep was not recorded.  Impression:  This is an abnormal EEG. There is evidence of a seizure tendency emanating from the right posterior temporal lobe putting the patient at risk of focal seizures with or without secondary generalization.    Rogue Jury, MS, MD

## 2019-01-02 NOTE — Progress Notes (Signed)
[facility] Progress Note Patient Name: Sameena Artus DOB: Jan 29, 1971 MRN: [id number]   Date of Service  01/02/2019  HPI/Events of Note  Constipation  eICU Interventions  Dulcolax supp x 1        Norita Meigs U Kevron Patella 01/02/2019, 8:55 PM

## 2019-01-02 NOTE — Progress Notes (Signed)
EEG completed, results pending. 

## 2019-01-02 NOTE — ED Triage Notes (Signed)
TC to 3 m  Room changed to 2 M . Bed placement needs to assignee new room.

## 2019-01-02 NOTE — ED Notes (Addendum)
Pt states has not taken her BP meds x1 year.

## 2019-01-02 NOTE — ED Notes (Signed)
Contacted family, Shirlyn Goltz, [phone].

## 2019-01-02 NOTE — Progress Notes (Signed)
  Echocardiogram 2D Echocardiogram has been performed.  [place] Deputy Michiko Lineman 01/02/2019, 1:16 PM

## 2019-01-02 NOTE — ED Notes (Signed)
ED TO INPATIENT HANDOFF REPORT  ED Nurse Name and Phone #:  [phone]   S Name/Age/Gender Emily Key 48 y.o. female Room/Bed: TRACC/TRACC  Code Status   Code Status: Prior  Home/SNF/Other Home Patient oriented to: self, place, time and situation Is this baseline? Yes   Triage Complete: Triage complete  Chief Complaint seizure   Triage Note Per family, pt has no hx of seizure, but son and sister state that she was having full body convulsions.  When EMS arrived, pt was unresponsive with snoring respirations and no pupil reaction. Pt is A&O on arrival.   Allergies No Known Allergies  Level of Care/Admitting Diagnosis ED Disposition    ED Disposition Condition [facility] Area: [place] [100100]  Level of Care: Progressive [102]  I expect the patient will be discharged within 24 hours: No (not a candidate for 5C-Observation unit)  Covid Evaluation: Screening Protocol (No Symptoms)  Diagnosis: Hypertensive urgency [578469]  Admitting Physician: Rise Patience [phone]  Attending Physician: Rise Patience [[id number]]  PT Class (Do Not Modify): Observation [104]  PT Acc Code (Do Not Modify): Observation [10022]       B Medical/Surgery History Past Medical History:  Diagnosis Date  . CHF (congestive heart failure) ([place])    after last pregnancy  . Daily headache   . Heart murmur    "born w/one":  . History of hiatal hernia   . Hypertension   . Nausea, vomiting and diarrhea 01/30/2015  . Pneumonia 11/2014   Past Surgical History:  Procedure Laterality Date  . CARDIAC CATHETERIZATION N/A 02/13/2015   Procedure: Left Heart Cath and Coronary Angiography;  Surgeon: Troy Sine, MD;  Location: [facility];  Service: Cardiovascular;  Laterality: N/A;  . PLEURAL EFFUSION DRAINAGE Left 11/14/2014   Procedure: DRAINAGE OF LEFT PLEURAL EFFUSION;  Surgeon: Melrose Nakayama, MD;  Location: [facility];  Service: Thoracic;   Laterality: Left;  . TONSILLECTOMY AND ADENOIDECTOMY  ~ 1986  . TUBAL LIGATION  2000  . VIDEO ASSISTED THORACOSCOPY (VATS)/DECORTICATION Left 11/14/2014   Procedure: LEFT VIDEO ASSISTED THORACOSCOPY (VATS)/DECORTICATION;  Surgeon: Melrose Nakayama, MD;  Location: [facility];  Service: Thoracic;  Laterality: Left;     A IV Location/Drains/Wounds Patient Lines/Drains/Airways Status   Active Line/Drains/Airways    Name:   Placement date:   Placement time:   Site:   Days:   Peripheral IV 01/02/19 Left Forearm   01/02/19    0359    Forearm   less than 1   Incision (Closed) 11/14/14 Chest Left   11/14/14    1103     1510          Intake/Output Last 24 hours No intake or output data in the 24 hours ending 01/02/19 0651  Labs/Imaging Results for orders placed or performed during the hospital encounter of 01/02/19 (from the past 48 hour(s))  CBC with Differential     Status: Abnormal   Collection Time: 01/02/19  3:54 AM  Result Value Ref Range   WBC 7.5 4.0 - 10.5 K/uL   RBC 5.37 (H) 3.87 - 5.11 MIL/uL   Hemoglobin 14.5 12.0 - 15.0 g/dL   HCT 46.1 (H) 36.0 - 46.0 %   MCV 85.8 80.0 - 100.0 fL   MCH 27.0 26.0 - 34.0 pg   MCHC 31.5 30.0 - 36.0 g/dL   RDW 13.9 11.5 - 15.5 %   Platelets 248 150 - 400 K/uL  nRBC 0.0 0.0 - 0.2 %   Neutrophils Relative % 72 %   Neutro Abs 5.4 1.7 - 7.7 K/uL   Lymphocytes Relative 19 %   Lymphs Abs 1.4 0.7 - 4.0 K/uL   Monocytes Relative 4 %   Monocytes Absolute 0.3 0.1 - 1.0 K/uL   Eosinophils Relative 4 %   Eosinophils Absolute 0.3 0.0 - 0.5 K/uL   Basophils Relative 1 %   Basophils Absolute 0.1 0.0 - 0.1 K/uL   Immature Granulocytes 0 %   Abs Immature Granulocytes 0.01 0.00 - 0.07 K/uL    Comment: Performed at [facility] [address], [place], [place] [address]  Basic metabolic panel     Status: Abnormal   Collection Time: 01/02/19  3:54 AM  Result Value Ref Range   Sodium 137 135 - 145 mmol/L   Potassium 3.5 3.5 - 5.1 mmol/L    Chloride 105 98 - 111 mmol/L   CO2 19 (L) 22 - 32 mmol/L   Glucose, Bld 165 (H) 70 - 99 mg/dL   BUN 16 6 - 20 mg/dL   Creatinine, Ser 1.12 (H) 0.44 - 1.00 mg/dL   Calcium 9.1 8.9 - 10.3 mg/dL   GFR calc non Af Amer 58 (L) >60 mL/min   GFR calc Af Amer >60 >60 mL/min   Anion gap 13 5 - 15    Comment: Performed at [facility], [place] [address], [place], [place] [address]  Urinalysis, Routine w reflex microscopic     Status: Abnormal   Collection Time: 01/02/19  3:54 AM  Result Value Ref Range   Color, Urine YELLOW YELLOW   APPearance CLEAR CLEAR   Specific Gravity, Urine 1.012 1.005 - 1.030   pH 6.0 5.0 - 8.0   Glucose, UA 150 (A) NEGATIVE mg/dL   Hgb urine dipstick SMALL (A) NEGATIVE   Bilirubin Urine NEGATIVE NEGATIVE   Ketones, ur NEGATIVE NEGATIVE mg/dL   Protein, ur >=300 (A) NEGATIVE mg/dL   Nitrite NEGATIVE NEGATIVE   Leukocytes,Ua NEGATIVE NEGATIVE   RBC / HPF 6-10 0 - 5 RBC/hpf   WBC, UA 6-10 0 - 5 WBC/hpf   Bacteria, UA NONE SEEN NONE SEEN   Squamous Epithelial / LPF 0-5 0 - 5   Mucus PRESENT     Comment: Performed at [facility], [place] [address] [place], [place] [address]   Ct Head Wo Contrast  Result Date: 01/02/2019 CLINICAL DATA:  Seizure, new, nontraumatic EXAM: CT HEAD WITHOUT CONTRAST TECHNIQUE: Contiguous axial images were obtained from the base of the skull through the vertex without intravenous contrast. COMPARISON:  05/19/2006 FINDINGS: Brain: Interval but chronic appearing right occipital infarct that is moderate size. There is artifact versus age-indeterminate ischemia in the left occipital cortex where the gray-white differentiation is lost. Remote appearing lacunar infarct in the left caudate head and left thalamus. No hemorrhage, hydrocephalus, or masslike finding. Vascular: No hyperdense vessel. Degree of tortuosity at the circle-of-Willis Skull: Negative Sinuses/Orbits: Negative IMPRESSION: 1. Artifact versus age-indeterminate ischemia at the  left occipital cortex. 2. 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 Electronically Signed   By: Monte Fantasia M.D.   On: 01/02/2019 05:37    Pending Labs Unresulted Labs (From admission, onward)    Start     Ordered   01/02/19 0627  Urine rapid drug screen (hosp performed)  ONCE - STAT,   STAT     01/02/19 5456  Vitals/Pain Today's Vitals   01/02/19 0445 01/02/19 0500 01/02/19 0530 01/02/19 0600  BP: (!) 189/108 (!) 191/112  (!) 198/107  Pulse: 71 72  62  Resp:    20  Temp:      TempSrc:      SpO2: 97% 95%  100%  Weight:      Height:      PainSc:   0-No pain Asleep    Isolation Precautions No active isolations  Medications Medications  labetalol (NORMODYNE) injection 10 mg (10 mg Intravenous Given 01/02/19 0612)  labetalol (NORMODYNE) injection 10 mg (10 mg Intravenous Given 01/02/19 0414)    Mobility walks High fall risk   Focused Assessments Cardiac Assessment Handoff:  Cardiac Rhythm: Normal sinus rhythm Lab Results  Component Value Date   TROPONINI 0.03 04/10/2015   Lab Results  Component Value Date   DDIMER 1.25 (H) 02/14/2015   Does the Patient currently have chest pain? No  , Neuro Assessment Handoff:  Swallow screen pass? n/a Cardiac Rhythm: Normal sinus rhythm       Neuro Assessment: Within Defined Limits Neuro Checks:      Last Documented NIHSS Modified Score:   Has TPA been given? No If patient is a Neuro Trauma and patient is going to OR before floor call report to [facility] nurse: [phone] or [phone]     R Recommendations: See Admitting Provider Note  Report given to:   Additional Notes:  Pt lives with her sister and her son. She stopped taking her medications over a year ago and states that she cannot afford them.

## 2019-01-02 NOTE — ED Provider Notes (Signed)
[facility] EMERGENCY DEPARTMENT Provider Note   CSN: [id number] Arrival date & time: 01/02/19  0343    History   Chief Complaint Chief Complaint  Patient presents with  . Seizures    HPI Emily Key is a 48 y.o. female.     HPI  This a 48 year old female with a history of heart failure, hypertension who presents with possible seizure.  Per EMS report, patient was noted to be convulsing and unresponsive by family.  EMS was called.  Upon their arrival she was altered.  Upon arrival to the ED she is awake and oriented.  She does not have any recollection of the events tonight.  No known history of seizures.  She reports a mild headache but otherwise denies any recent illnesses, fever, chest pain, shortness breath, abdominal pain, strokelike symptoms.  She does have a history of high blood pressure but has not taken medications in over 1 year.  Past Medical History:  Diagnosis Date  . CHF (congestive heart failure) ([place])    after last pregnancy  . Daily headache   . Heart murmur    "born w/one":  . History of hiatal hernia   . Hypertension   . Nausea, vomiting and diarrhea 01/30/2015  . Pneumonia 11/2014    Patient Active Problem List   Diagnosis Date Noted  . Adnexal mass 02/18/2015  . Elevated troponin 02/14/2015  . 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 H/O non-ST elevation myocardial infarction (NSTEMI)   . Substance abuse ([place])   . Chronic kidney disease 11/28/2014  . Hypertension 11/26/2014    Past Surgical History:  Procedure Laterality Date  . CARDIAC CATHETERIZATION N/A 02/13/2015   Procedure: Left Heart Cath and Coronary Angiography;  Surgeon: Troy Sine, MD;  Location: [facility];  Service: Cardiovascular;  Laterality: N/A;  . PLEURAL EFFUSION DRAINAGE Left 11/14/2014   Procedure: DRAINAGE OF LEFT PLEURAL EFFUSION;  Surgeon: Melrose Nakayama, MD;  Location: [facility];  Service: Thoracic;  Laterality: Left;  . TONSILLECTOMY AND ADENOIDECTOMY  ~  1986  . TUBAL LIGATION  2000  . VIDEO ASSISTED THORACOSCOPY (VATS)/DECORTICATION Left 11/14/2014   Procedure: LEFT VIDEO ASSISTED THORACOSCOPY (VATS)/DECORTICATION;  Surgeon: Melrose Nakayama, MD;  Location: [facility];  Service: Thoracic;  Laterality: Left;     OB History   No obstetric history on file.      Home Medications    Prior to Admission medications   Medication Sig Start Date End Date Taking? Authorizing Provider  cetirizine (ZYRTEC) 10 MG tablet Take 1 tablet (10 mg total) by mouth daily. Patient not taking: Reported on 01/02/2019 04/10/15   Boykin Nearing, MD  cloNIDine (CATAPRES) 0.2 MG tablet Take 1 tablet (0.2 mg total) by mouth 3 (three) times daily. Patient not taking: Reported on 01/02/2019 04/10/15   Boykin Nearing, MD  hydrALAZINE (APRESOLINE) 50 MG tablet Take 1 tablet (50 mg total) by mouth every 8 (eight) hours. Patient not taking: Reported on 01/02/2019 04/10/15   Boykin Nearing, MD  labetalol (NORMODYNE) 100 MG tablet Take 1 tablet (100 mg total) by mouth 2 (two) times daily. Patient not taking: Reported on 01/02/2019 04/10/15   Boykin Nearing, MD  lisinopril (PRINIVIL,ZESTRIL) 20 MG tablet Take 1 tablet (20 mg total) by mouth daily. Patient not taking: Reported on 01/02/2019 04/10/15   Boykin Nearing, MD    Family History Family History  Problem Relation Age of Onset  . Diabetes Mellitus II Mother   . Hypertension Mother   .  Lung cancer Father     Social History Social History   Tobacco Use  . Smoking status: Former Smoker    Packs/day: 0.33    Years: 10.00    Pack years: 3.30    Types: Cigarettes    Quit date: 11/11/2014    Years since quitting: 4.1  . Smokeless tobacco: Never Used  Substance Use Topics  . Alcohol use: Yes    Alcohol/week: 0.0 standard drinks    Comment: 8/1/20176 "a couple beers a couple times/month"  . Drug use: No     Allergies   Patient has no known allergies.   Review of Systems Review of Systems   Constitutional: Negative for fever.  Respiratory: Negative for shortness of breath.   Cardiovascular: Negative for chest pain.  Gastrointestinal: Negative for abdominal pain.  Genitourinary: Negative for dysuria.  Neurological: Positive for seizures and headaches. Negative for dizziness and weakness.  Psychiatric/Behavioral: Negative for behavioral problems.  All other systems reviewed and are negative.    Physical Exam Updated Vital Signs BP (!) 191/112   Pulse 72   Temp (!) 97.3 F (36.3 C) (Tympanic)   Resp 19   Ht 1.727 m (5\' 8" )   Wt 86.2 kg   SpO2 95%   BMI 28.89 kg/m   Physical Exam Vitals signs and nursing note reviewed.  Constitutional:      Appearance: She is well-developed. She is not ill-appearing.  HENT:     Head: Normocephalic and atraumatic.     Nose: Nose normal.     Mouth/Throat:     Mouth: Mucous membranes are moist.  Eyes:     Extraocular Movements: Extraocular movements intact.     Pupils: Pupils are equal, round, and reactive to light.     Comments: Pupils 2 mm and reactive bilaterally  Neck:     Musculoskeletal: Neck supple.  Cardiovascular:     Rate and Rhythm: Normal rate and regular rhythm.     Heart sounds: Normal heart sounds.  Pulmonary:     Effort: Pulmonary effort is normal. No respiratory distress.     Breath sounds: No wheezing.  Abdominal:     General: Bowel sounds are normal.     Palpations: Abdomen is soft.  Skin:    General: Skin is warm and dry.  Neurological:     Mental Status: She is alert and oriented to person, place, and time.     Comments: Cranial nerves II through XII intact, 5 out of 5 strength in all 4 extremities, no dysmetria to finger-nose-finger, no drift      ED Treatments / Results  Labs (all labs ordered are listed, but only abnormal results are displayed) Labs Reviewed  CBC WITH DIFFERENTIAL/PLATELET - Abnormal; Notable for the following components:      Result Value   RBC 5.37 (*)    HCT 46.1 (*)     All other components within normal limits  BASIC METABOLIC PANEL - Abnormal; Notable for the following components:   CO2 19 (*)    Glucose, Bld 165 (*)    Creatinine, Ser 1.12 (*)    GFR calc non Af Amer 58 (*)    All other components within normal limits  URINALYSIS, ROUTINE W REFLEX MICROSCOPIC - Abnormal; Notable for the following components:   Glucose, UA 150 (*)    Hgb urine dipstick SMALL (*)    Protein, ur >=300 (*)    All other components within normal limits    EKG None  Radiology  Ct Head Wo Contrast  Result Date: 01/02/2019 CLINICAL DATA:  Seizure, new, nontraumatic EXAM: CT HEAD WITHOUT CONTRAST TECHNIQUE: Contiguous axial images were obtained from the base of the skull through the vertex without intravenous contrast. COMPARISON:  05/19/2006 FINDINGS: Brain: Interval but chronic appearing right occipital infarct that is moderate size. There is artifact versus age-indeterminate ischemia in the left occipital cortex where the gray-white differentiation is lost. Remote appearing lacunar infarct in the left caudate head and left thalamus. No hemorrhage, hydrocephalus, or masslike finding. Vascular: No hyperdense vessel. Degree of tortuosity at the circle-of-Willis Skull: Negative Sinuses/Orbits: Negative IMPRESSION: 1. Artifact versus age-indeterminate ischemia at the left occipital cortex. 2. 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 Electronically Signed   By: Monte Fantasia M.D.   On: 01/02/2019 05:37    Procedures Procedures (including critical care time)  CRITICAL CARE Performed by: Merryl Hacker   Total critical care time: 35 minutes  Critical care time was exclusive of separately billable procedures and treating other patients.  Critical care was necessary to treat or prevent imminent or life-threatening deterioration.  Critical care was time spent personally by me on the following activities: development of treatment plan  with patient and/or surrogate as well as nursing, discussions with consultants, evaluation of patient's response to treatment, examination of patient, obtaining history from patient or surrogate, ordering and performing treatments and interventions, ordering and review of laboratory studies, ordering and review of radiographic studies, pulse oximetry and re-evaluation of patient's condition.   Medications Ordered in ED Medications  labetalol (NORMODYNE) injection 10 mg (has no administration in time range)  labetalol (NORMODYNE) injection 10 mg (10 mg Intravenous Given 01/02/19 0414)     Initial Impression / Assessment and Plan / ED Course  I have reviewed the triage vital signs and the nursing notes.  Pertinent labs & imaging results that were available during my care of the patient were reviewed by me and considered in my medical decision making (see chart for details).  Clinical Course as of Jan 02 603  Wed Jan 02, 2019  0437 Sister updated by phone.  She was not present tonight but reports that patient's son described patient as unresponsive and convulsing.   [CH]  7939 Lab work-up and CT reviewed.  Ischemic changes noted on CT scan.  No known history.  Patient responded well to labetalol with blood pressure now 190/110.  Did discuss the patient with neurology, Dr. Leonel Ramsay.  He recommends MRI and treatment for hypertensive emergency.   [CH]    Clinical Course User Index [CH] Abrham Maslowski, Barbette Hair, MD       Patient presents with what sounds like a new onset seizure at home.  She is back to baseline.  She is overall nontoxic and nonfocal.  She she is extremely hypertensive on initial evaluation with a systolic blood pressure 030/092.  Patient was given IV labetalol with improvement into the 190s over 110s.  Basic lab work obtained and largely reassuring.  CT scan of the head shows remote infarcts and ischemia.  Concern would be for hypertensive emergency or press in this situation.  I  did discuss with neurology.  Recommend MRI and blood pressure control.  IV labetalol every 2 hours PRN ordered as well as MRI.  We will plan for admission.  Final Clinical Impressions(s) / ED Diagnoses   Final diagnoses:  Hypertensive emergency  Seizure [facility])    ED Discharge Orders    None  Merryl Hacker, MD 01/02/19 [id number]

## 2019-01-02 NOTE — Consult Note (Signed)
Requesting Physician: Dr. Dina Rich    Chief Complaint:   History obtained from: Patient and Chart     HPI:                                                                                                                                       Emily Key is a 48 y.o. female with past medical history of hypertension, CHF noncompliant on medications presents to the ED after patient was seen convulsing by family and then became unresponsive   On arrival, the patient was altered and was greater than the 409 systolic.  Patient slowly became responsive alert and following commands.  Complains of mild blurry vision as well as headache.  CT head performed showed questionable hypodensity in the left occipital lobe concerning for posterior reversible encephalopathy syndrome.  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  No evidence of PRES.  Patient denies any history of seizures.  No family history of seizure.  Electrolytes within normal limits.     Past Medical History:  Diagnosis Date  . CHF (congestive heart failure) ([place])    after last pregnancy  . Daily headache   . Heart murmur    "born w/one":  . History of hiatal hernia   . Hypertension   . Pneumonia 11/2014    Past Surgical History:  Procedure Laterality Date  . CARDIAC CATHETERIZATION N/A 02/13/2015   Procedure: Left Heart Cath and Coronary Angiography;  Surgeon: Troy Sine, MD;  Location: [facility];  Service: Cardiovascular;  Laterality: N/A;  . PLEURAL EFFUSION DRAINAGE Left 11/14/2014   Procedure: DRAINAGE OF LEFT PLEURAL EFFUSION;  Surgeon: Melrose Nakayama, MD;  Location: [facility];  Service: Thoracic;  Laterality: Left;  . TONSILLECTOMY AND ADENOIDECTOMY  ~ 1986  . TUBAL LIGATION  2000  . VIDEO ASSISTED THORACOSCOPY (VATS)/DECORTICATION Left 11/14/2014   Procedure: LEFT VIDEO ASSISTED THORACOSCOPY (VATS)/DECORTICATION;  Surgeon: Melrose Nakayama,  MD;  Location: [facility];  Service: Thoracic;  Laterality: Left;    Family History  Problem Relation Age of Onset  . Diabetes Mellitus II Mother   . Hypertension Mother   . Lung cancer Father    Social History:  reports that she quit smoking about 4 years ago. Her smoking use included cigarettes. She has a 3.30 pack-year smoking history. She has never used smokeless tobacco. She reports current alcohol use. She reports that she does not use drugs.  Allergies: No Known Allergies  Medications:  I reviewed home medications   ROS:                                                                                                                                     14 systems reviewed and negative except above    Examination:                                                                                                      General: Appears well-developed and well-nourished.  Psych: Affect appropriate to situation Eyes: No scleral injection HENT: No OP obstrucion Head: Normocephalic.  Cardiovascular: Normal rate and regular rhythm.  Respiratory: Effort normal and breath sounds normal to anterior ascultation GI: Soft.  No distension. There is no tenderness.  Skin: WDI    Neurological Examination Mental Status: Alert, oriented, thought content appropriate.  Speech fluent without evidence of aphasia. Able to follow 3 step commands without difficulty. Cranial Nerves: II: Visual fields grossly normal,  III,IV, VI: ptosis not present, extra-ocular motions intact bilaterally, pupils equal, round, reactive to light and accommodation V,VII: smile symmetric, facial light touch sensation normal bilaterally VIII: hearing normal bilaterally IX,X: uvula rises symmetrically XI: bilateral shoulder shrug XII: midline tongue extension Motor: Right : Upper extremity   5/5    Left:      Upper extremity   5/5  Lower extremity   5/5     Lower extremity   5/5 Tone and bulk:normal tone throughout; no atrophy noted Sensory: Pinprick and light touch intact throughout, bilaterally Deep Tendon Reflexes: 2+ and symmetric throughout Plantars: Right: downgoing   Left: downgoing Cerebellar: normal finger-to-nose, normal rapid alternating movements and normal heel-to-shin test Gait: deferred     Lab Results: Basic Metabolic Panel: Recent Labs  Lab 01/02/19 0354  NA 137  K 3.5  CL 105  CO2 19*  GLUCOSE 165*  BUN 16  CREATININE 1.12*  CALCIUM 9.1    CBC: Recent Labs  Lab 01/02/19 0354  WBC 7.5  NEUTROABS 5.4  HGB 14.5  HCT 46.1*  MCV 85.8  PLT 248    Coagulation Studies: No results for input(s): LABPROT, INR in the last 72 hours.  Imaging: Ct Head Wo Contrast  Result Date: 01/02/2019 CLINICAL DATA:  Seizure, new, nontraumatic EXAM: CT HEAD WITHOUT CONTRAST TECHNIQUE: Contiguous axial images were obtained from the base of the skull through the vertex without intravenous contrast. COMPARISON:  05/19/2006 FINDINGS: Brain: Interval but chronic appearing right occipital infarct that is moderate size. There is artifact  versus age-indeterminate ischemia in the left occipital cortex where the gray-white differentiation is lost. Remote appearing lacunar infarct in the left caudate head and left thalamus. No hemorrhage, hydrocephalus, or masslike finding. Vascular: No hyperdense vessel. Degree of tortuosity at the circle-of-Willis Skull: Negative Sinuses/Orbits: Negative IMPRESSION: 1. Artifact versus age-indeterminate ischemia at the left occipital cortex. 2. 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 Electronically Signed   By: Monte Fantasia M.D.   On: 01/02/2019 05:37   Mr Brain Wo Contrast  Result Date: 01/02/2019 CLINICAL DATA:  Seizure, new, abnormal neuro exam. EXAM: MRI HEAD WITHOUT CONTRAST TECHNIQUE: Multiplanar, multiecho  pulse sequences of the brain and surrounding structures were obtained without intravenous contrast. COMPARISON:  Head CT from earlier today FINDINGS: Brain: No acute infarction, hemorrhage, hydrocephalus, extra-axial collection or mass lesion. Small to moderate remote cortical infarct in the paramedian right occipital lobe. Remote lacunar infarcts in the left caudate head and left thalamus. Chronic small vessel ischemic gliosis in the cerebral white matter. No acute hemorrhage, hydrocephalus, mass, or collection. Vascular: Tortuous vessels likely from chronic hypertension. Major flow voids are preserved Skull and upper cervical spine: Negative for marrow lesion Sinuses/Orbits: Negative IMPRESSION: 1. No emergent finding. Left occipital appearance on head CT was artifactual. 2. Chronic ischemic injury including remote right occipital cortex and left deep gray nuclei infarcts. Electronically Signed   By: Monte Fantasia M.D.   On: 01/02/2019 07:41     I have reviewed the above imaging   ASSESSMENT AND PLAN  48 year old female history of hypertension, CHF who was not compliant on her medication for over a year presents with a seizure in the setting of hypertensive emergency.  Currently back to her baseline except for mild visual symptoms.  Hypertensive Emergency  - BP goal less than 140 SBP  Seizure Likely provoked in the setting of hypertensive crisis No AED as this first seizure, provoked MRI negative for PRES Seizure precautions including no driving x 6 months   Sushanth Aroor [facility] Pager Number [phone]

## 2019-01-02 NOTE — ED Triage Notes (Addendum)
Per family, pt has no hx of seizure, but son and sister state that she was having full body convulsions.  When EMS arrived, pt was unresponsive with snoring respirations and no pupil reaction. Pt is A&O on arrival.

## 2019-01-02 NOTE — H&P (Addendum)
PULMONARY / CRITICAL CARE MEDICINE   NAME:  Emily Key, MRN:  [id number], DOB:  1970-07-23, LOS: 0 ADMISSION DATE:  01/02/2019, CONSULTATION DATE: 01/02/2019 REFERRING MD: [facility], CHIEF COMPLAINT: Altered mental status seizure activity hypertensive crisis  BRIEF HISTORY:    48 year old female with known history of hypertension noncompliant with medications.  Was at home and witnessed by family to have seizure activity.  EMS was activated she is transported to [facility] emergency department.  She was treated with antihypertensives seizure activity was stopped.  She had a EEG performed and neurology consulted.  Her blood pressure was noted to be as high as 224 and has come down to 190 with a goal of 016 systolic.  She has a significant past medical history that is notable for hypertension with noncompliance of medications, substance abuse positive for marijuana on this admission, chronic kidney disease with current creatinine 1.9, history of non-ST elevation MI, empyema, adnexal mass, community-acquired pneumonia.  Pulmonary critical care asked to consult for hypertensive emergency with associated seizure.  No evidence of PRES at present.   In ED, SBP has been variable, with SBP in 220s, 180s, 140s, and now 180s again.  ECHO  And EEG have been ordered and results are pending.   HISTORY OF PRESENT ILLNESS   48 year old female with history of hypertension noncompliant medications who presents with seizure activity.  To have a systolic blood pressure of 224.  Treated with antihypertensives neurology consult obtained treated with antihypertensives. SIGNIFICANT PAST MEDICAL HISTORY   Hypertension noncompliance Non-ST elevated MI Chronic renal insufficiency Substance abuse Empyema Spector chronic obstructive pulmonary disease Noncompliance  SIGNIFICANT EVENTS:  01/02/2019 admitted with seizure activity and hypertensive emergency  STUDIES:   01/02/2019 EEG>> 01/02/2019.  Questionable ischemia left  occipital cortex.  Remote infarct of rt occipital cortex, lt thalamus, lt caudate 01/02/2019 MRI.  No emergent finding.  Left with subtle appearance of head CT was artifactual. Chronic extremity injury including right of settable cortex left deep gray matter nuclear infarcts. 01/02/19 ECHO >>  CULTURES:  SARS Cov2 6/24 > negative   ANTIBIOTICS:   LINES/TUBES:   CONSULTANTS:  01/02/2019 neurology 01/02/2019 pulmonary critical care SUBJECTIVE:  Complains of mild blurry vision SBP remains elevated  Ordering cardene gtt   CONSTITUTIONAL: BP (!) 170/85   Pulse 74   Temp (!) 97.3 F (36.3 C) (Tympanic)   Resp (!) 35   Ht 5\' 8"  (1.727 m)   Wt 86.2 kg   SpO2 100%   BMI 28.89 kg/m   No intake/output data recorded.     FiO2 (%):  [21 %] 21 %  PHYSICAL EXAM: General:  WDWN adult female, comfortable appearing in NAD Neuro:  AAOx3, following commands.  HEENT:  NCAT. Pink mmm. Trachea midline. Anicteric sclera Cardiovascular:  RRR s1s2 no BLE edema. Capillary refill brisk and < 3 seconds  Lungs:  CTA bilaterally. No accessory muscle recruitment, symmetrical chest expansion.  Abdomen:  LUQ point tenderness. No organomegaly. + bowel sounds x 4.  Musculoskeletal:  Symmetrical bulk and tone, no obvious deformity. No cyanosis no clubbing  Skin:  Clean, dry, warm, without rash   RESOLVED PROBLEM LIST   ASSESSMENT AND PLAN    Hypertensive emergency in the setting of noncompliance with oral hyper antihypertensives Has been restarted on Coreg, lasix, lisinopril  Has received PRN hydral and PRN labetalol with SBP improvement temporarily to 180s, however has since climbed to 200, with subsequent drop to 143, with latest SBP 170.  Given challenging control, I have  ordered cardene gtt for SBP goal 160-180  Fractionated catecholamines ordered (prior concern for pheo)  Will need ICU placement for cardene gtt. Awaiting COVID-19 result for appropriate placement   Witnessed seizure activity in  the setting of hypertensive emergency Neurology's input appreciated EEG has been completed will need to be read by neurology No antiseizure medication at this time per neurology  Acute on chronic combined CHF ECHO has been ordered Pending results, may involve cardiology however ACS is not currently suspected   Tobacco abuse Low-dose nicotine patch  smoking cessation  Chronic kidney disease Lab Results  Component Value Date   CREATININE 1.12 (H) 01/02/2019   CREATININE 0.97 02/25/2015   CREATININE 0.79 02/15/2015   CREATININE 0.97 02/14/2015   CREATININE 1.07 12/03/2014   Avoid nephrotoxins Monitor creatinine  Hyperglycemia -Appears to be pre-diabetic with a1c 5.7 SSI   History of non-ST elevation MI Check troponins Twelve-lead EKG  History of substance abuse, tobacco abuse Toxicology screen positive for marijuana Will need tobacco cessation counseling    SUMMARY OF TODAY'S PLAN:  48 year old presents with seizures hypertensive emergency, no evidence of PRES at present  Will admit to ICU for cardene gtt for SBP management  Best Practice / Goals of Care / Disposition.   DVT PROPHYLAXIS:pas SUP:ppi NUTRITION:diet MOBILITYbed rest GOALS OF CARE: full code FAMILY DISCUSSIONS: na currently DISPOSITION SDU vs ICU  LABS  Glucose No results for input(s): GLUCAP in the last 168 hours.  BMET Recent Labs  Lab 01/02/19 0354  NA 137  K 3.5  CL 105  CO2 19*  BUN 16  CREATININE 1.12*  GLUCOSE 165*    Liver Enzymes No results for input(s): AST, ALT, ALKPHOS, BILITOT, ALBUMIN in the last 168 hours.  Electrolytes Recent Labs  Lab 01/02/19 0354  CALCIUM 9.1    CBC Recent Labs  Lab 01/02/19 0354  WBC 7.5  HGB 14.5  HCT 46.1*  PLT 248    ABG No results for input(s): PHART, PCO2ART, PO2ART in the last 168 hours.  Coag's No results for input(s): APTT, INR in the last 168 hours.  Sepsis Markers No results for input(s): LATICACIDVEN, PROCALCITON,  O2SATVEN in the last 168 hours.  Cardiac Enzymes No results for input(s): TROPONINI, PROBNP in the last 168 hours.  PAST MEDICAL HISTORY :   She  has a past medical history of CHF (congestive heart failure) ([place]), Daily headache, Heart murmur, History of hiatal hernia, Hypertension, and Pneumonia (11/2014).  PAST SURGICAL HISTORY:  She  has a past surgical history that includes Video assisted thoracoscopy (vats)/decortication (Left, 11/14/2014); Pleural effusion drainage (Left, 11/14/2014); Tubal ligation (2000); Tonsillectomy and adenoidectomy (~ 1986); and Cardiac catheterization (N/A, 02/13/2015).  No Known Allergies  No current facility-administered medications on file prior to encounter.    Current Outpatient Medications on File Prior to Encounter  Medication Sig   cloNIDine (CATAPRES) 0.2 MG tablet Take 1 tablet (0.2 mg total) by mouth 3 (three) times daily. (Patient not taking: Reported on 01/02/2019)   hydrALAZINE (APRESOLINE) 50 MG tablet Take 1 tablet (50 mg total) by mouth every 8 (eight) hours. (Patient not taking: Reported on 01/02/2019)   labetalol (NORMODYNE) 100 MG tablet Take 1 tablet (100 mg total) by mouth 2 (two) times daily. (Patient not taking: Reported on 01/02/2019)   lisinopril (PRINIVIL,ZESTRIL) 20 MG tablet Take 1 tablet (20 mg total) by mouth daily. (Patient not taking: Reported on 01/02/2019)    FAMILY HISTORY:   Her family history includes Diabetes Mellitus II in her  mother; Hypertension in her mother; Lung cancer in her father.  SOCIAL HISTORY:  She  reports that she quit smoking about 4 years ago. Her smoking use included cigarettes. She has a 3.30 pack-year smoking history. She has never used smokeless tobacco. She reports current alcohol use. She reports that she does not use drugs.  REVIEW OF SYSTEMS:     As per HPI    Critical Care Time 40 minutes  Eliseo Gum MSN, AGACNP-BC [place] [phone] If no answer,  [phone] 01/02/2019, 2:22 PM

## 2019-01-03 LAB — CBC WITH DIFFERENTIAL/PLATELET
Abs Immature Granulocytes: 0.03 10*3/uL (ref 0.00–0.07)
Basophils Absolute: 0 10*3/uL (ref 0.0–0.1)
Basophils Relative: 0 %
Eosinophils Absolute: 0.1 10*3/uL (ref 0.0–0.5)
Eosinophils Relative: 1 %
HCT: 48.5 % — ABNORMAL HIGH (ref 36.0–46.0)
Hemoglobin: 15.9 g/dL — ABNORMAL HIGH (ref 12.0–15.0)
Immature Granulocytes: 0 %
Lymphocytes Relative: 14 %
Lymphs Abs: 1.5 10*3/uL (ref 0.7–4.0)
MCH: 26.8 pg (ref 26.0–34.0)
MCHC: 32.8 g/dL (ref 30.0–36.0)
MCV: 81.6 fL (ref 80.0–100.0)
Monocytes Absolute: 0.5 10*3/uL (ref 0.1–1.0)
Monocytes Relative: 5 %
Neutro Abs: 8.2 10*3/uL — ABNORMAL HIGH (ref 1.7–7.7)
Neutrophils Relative %: 80 %
Platelets: 260 10*3/uL (ref 150–400)
RBC: 5.94 MIL/uL — ABNORMAL HIGH (ref 3.87–5.11)
RDW: 13.8 % (ref 11.5–15.5)
WBC: 10.2 10*3/uL (ref 4.0–10.5)
nRBC: 0 % (ref 0.0–0.2)

## 2019-01-03 LAB — BASIC METABOLIC PANEL
Anion gap: 14 (ref 5–15)
BUN: 16 mg/dL (ref 6–20)
CO2: 22 mmol/L (ref 22–32)
Calcium: 9.4 mg/dL (ref 8.9–10.3)
Chloride: 103 mmol/L (ref 98–111)
Creatinine, Ser: 0.97 mg/dL (ref 0.44–1.00)
GFR calc Af Amer: >60 mL/min (ref >60)
GFR calc non Af Amer: >60 mL/min (ref >60)
Glucose, Bld: 102 mg/dL — ABNORMAL HIGH (ref 70–99)
Potassium: 3 mmol/L — ABNORMAL LOW (ref 3.5–5.1)
Sodium: 139 mmol/L (ref 135–145)

## 2019-01-03 LAB — HIV ANTIBODY (ROUTINE TESTING W REFLEX): HIV Screen 4th Generation wRfx: NONREACTIVE

## 2019-01-03 LAB — MAGNESIUM: Magnesium: 2.1 mg/dL (ref 1.7–2.4)

## 2019-01-03 LAB — TSH: TSH: 0.412 u[IU]/mL (ref 0.350–4.500)

## 2019-01-03 LAB — PHOSPHORUS: Phosphorus: 4.7 mg/dL — ABNORMAL HIGH (ref 2.5–4.6)

## 2019-01-03 MED ORDER — LABETALOL HCL 5 MG/ML IV SOLN
10.0000 mg | INTRAVENOUS | Status: DC | PRN
  Administered 2019-01-05: 10 mg via INTRAVENOUS
  Filled 2019-01-03: qty 4

## 2019-01-03 MED ORDER — LABETALOL HCL 5 MG/ML IV SOLN: 10.0000 mg | INTRAVENOUS | Status: DC | PRN

## 2019-01-03 MED ORDER — CHLORHEXIDINE GLUCONATE CLOTH 2 % EX PADS
6.0000 | MEDICATED_PAD | Freq: Every day | CUTANEOUS | Status: DC
  Administered 2019-01-03 – 2019-01-05 (×2): 6 via TOPICAL

## 2019-01-03 MED ORDER — ORAL CARE MOUTH RINSE
15.0000 mL | Freq: Two times a day (BID) | OROMUCOSAL | Status: DC
  Administered 2019-01-03 – 2019-01-05 (×5): 15 mL via OROMUCOSAL

## 2019-01-03 MED ORDER — POTASSIUM CHLORIDE CRYS ER 20 MEQ PO TBCR
40.0000 meq | EXTENDED_RELEASE_TABLET | ORAL | Status: AC
  Administered 2019-01-03 (×2): 40 meq via ORAL
  Filled 2019-01-03 (×2): qty 2

## 2019-01-03 MED ORDER — CHLORHEXIDINE GLUCONATE 0.12 % MT SOLN
15.0000 mL | Freq: Two times a day (BID) | OROMUCOSAL | Status: DC
  Administered 2019-01-03 – 2019-01-05 (×5): 15 mL via OROMUCOSAL
  Filled 2019-01-03 (×5): qty 15

## 2019-01-03 NOTE — Progress Notes (Signed)
PULMONARY / CRITICAL CARE MEDICINE   NAME:  Emily Key, MRN:  [id number], DOB:  03-20-1971, LOS: 1 ADMISSION DATE:  01/02/2019, CONSULTATION DATE: 01/02/2019 REFERRING MD: [facility], CHIEF COMPLAINT: Altered mental status seizure activity hypertensive crisis  BRIEF HISTORY:    48 year old female with known history of hypertension noncompliant with medications.  Was at home and witnessed by family to have seizure activity.  EMS was activated she is transported to [facility] emergency department.  She was treated with antihypertensives seizure activity was stopped.  She had a EEG performed and neurology consulted.  Her blood pressure was noted to be as high as 224 and has come down to 190 with a goal of 741 systolic.  She has a significant past medical history that is notable for hypertension with noncompliance of medications, substance abuse positive for marijuana on this admission, chronic kidney disease with current creatinine 1.9, history of non-ST elevation MI, empyema, adnexal mass, community-acquired pneumonia.  Pulmonary critical care asked to consult for hypertensive emergency with associated seizure.  No evidence of PRES at present.   In ED, SBP has been variable, with SBP in 220s, 180s, 140s, and now 180s again.  ECHO  And EEG have been ordered and results are pending.   HISTORY OF PRESENT ILLNESS   48 year old female with history of hypertension noncompliant medications who presents with seizure activity.  To have a systolic blood pressure of 224.  Treated with antihypertensives neurology consult obtained treated with antihypertensives. SIGNIFICANT PAST MEDICAL HISTORY   Hypertension noncompliance Non-ST elevated MI Chronic renal insufficiency Substance abuse Empyema Spector chronic obstructive pulmonary disease Noncompliance  SIGNIFICANT EVENTS:  01/02/2019 admitted with seizure activity and hypertensive emergency  STUDIES:   01/02/2019 EEG>> abnormal with evidence of seizure  tendency emanating from the right posterior temporal lobe. 01/02/2019.  Questionable ischemia left occipital cortex.  Remote infarct of rt occipital cortex, lt thalamus, lt caudate 01/02/2019 MRI.  No emergent finding.  Left with subtle appearance of head CT was artifactual. Chronic extremity injury including right of settable cortex left deep gray matter nuclear infarcts. 01/02/19 ECHO >> EF 40% left ventricular hypokinesis CULTURES:  SARS Cov2 6/24 > negative   ANTIBIOTICS:   LINES/TUBES:   CONSULTANTS:  01/02/2019 neurology 01/02/2019 pulmonary critical care SUBJECTIVE:  Complains of mild blurry vision SBP remains elevated  Ordering cardene gtt   CONSTITUTIONAL: BP 119/74   Pulse 64   Temp 97.9 F (36.6 C) (Oral)   Resp (!) 26   Ht 5\' 8"  (1.727 m)   Wt 75.8 kg   SpO2 98%   BMI 25.41 kg/m   I/O last 3 completed shifts: In: 428.7 [I.V.:428.7] Out: 350 [Urine:350]        PHYSICAL EXAM: General: Well-nourished well-developed female no acute distress HEENT: No lymphadenopathy is appreciated Neuro: Grossly intact, follows commands moves all extremities speech is clear requesting diet CV: s1s2 rrr, no m/r/g PULM: even/non-labored, lungs bilaterally air OI:NOMV, non-tender, bsx4 active  Extremities: warm/dry, negative edema  Skin: no rashes or lesions   RESOLVED PROBLEM LIST   ASSESSMENT AND PLAN    Hypertensive emergency in the setting of noncompliance with oral hyper antihypertensives Initiate patient's home pharmaceutical regimen that she has not been taking for greater than a year PRN antihypertensives Try her off the Cardene drip Valuations for pheochromocytoma Goal systolic blood pressure 672 or less.  She has not been on medications for over a year or 2 quickly but drop in systolic blood pressure may be more detrimental than a  mild elevation as she becomes better titrated in the near future Social worker being notified to assist her with obtaining medications  so she can be properly treated as an outpatient. Follow-up with outpatient MD provider is also been arranged.  Witnessed seizure activity in the setting of hypertensive emergency Neurology's input is appreciated and noted No antiseizure medication is recommended at this time  Acute on chronic combined CHF EF of 40% on 2D echo with some left ventricular diffuse hypokinesis with a right ventricle mildly reduced systolic function Consider follow-up with cardiology in future as an outpatient  Tobacco abuse Low-dose nicotine patch  smoking cessation  Chronic kidney disease Lab Results  Component Value Date   CREATININE 0.97 01/03/2019   CREATININE 1.04 (H) 01/02/2019   CREATININE 1.12 (H) 01/02/2019   CREATININE 0.97 02/25/2015   CREATININE 1.07 12/03/2014   Avoid nephrotoxins Creatinine appears to be improving  Hyperglycemia CBG (last 3)  No results for input(s): GLUCAP in the last 72 hours.   Sliding scale insulin  History of non-ST elevation MI Troponin I 0.03  History of substance abuse, tobacco abuse Toxicology screen positive for marijuana Smoking cessation suggested for the future   SUMMARY OF TODAY'S PLAN:  48 year old presents with seizures hypertensive emergency, no evidence of PRES at present  Will admit to ICU for cardene gtt for SBP management  Best Practice / Goals of Care / Disposition.   DVT PROPHYLAXIS:pas SUP:ppi NUTRITION: Heart healthy diet MOBILITYbed rest GOALS OF CARE: full code FAMILY DISCUSSIONS: Patient is communicating with her son via phone DISPOSITION transition out of the ICU  LABS  Glucose No results for input(s): GLUCAP in the last 168 hours.  BMET Recent Labs  Lab 01/02/19 0354 01/02/19 1911 01/03/19 0356  NA 137  --  139  K 3.5  --  3.0*  CL 105  --  103  CO2 19*  --  22  BUN 16  --  16  CREATININE 1.12* 1.04* 0.97  GLUCOSE 165*  --  102*    Liver Enzymes No results for input(s): AST, ALT, ALKPHOS, BILITOT,  ALBUMIN in the last 168 hours.  Electrolytes Recent Labs  Lab 01/02/19 0354 01/03/19 0356  CALCIUM 9.1 9.4  MG  --  2.1  PHOS  --  4.7*    CBC Recent Labs  Lab 01/02/19 0354 01/02/19 1911 01/03/19 0356  WBC 7.5 8.5 10.2  HGB 14.5 15.7* 15.9*  HCT 46.1* 47.6* 48.5*  PLT 248 263 260    ABG No results for input(s): PHART, PCO2ART, PO2ART in the last 168 hours.  Coag's No results for input(s): APTT, INR in the last 168 hours.  Sepsis Markers No results for input(s): LATICACIDVEN, PROCALCITON, O2SATVEN in the last 168 hours.  Cardiac Enzymes No results for input(s): TROPONINI, PROBNP in the last 168 hours.      Critical Care Time 30 minutes  Richardson Landry Mckaylah Bettendorf ACNP Maryanna Shape PCCM Pager [phone] till 1 pm If no answer page [phone] 01/03/2019, 11:11 AM

## 2019-01-03 NOTE — Progress Notes (Signed)
Report given to receiving RN Olivia Mackie, 3W. Pt to be transported by wheelchair.

## 2019-01-03 NOTE — Progress Notes (Signed)
Spoke w/ pts son to provide updates. 

## 2019-01-03 NOTE — Progress Notes (Addendum)
NEUROLOGY PROGRESS NOTE  Subjective: Back to baseline with no complaints.  Exam: Vitals:   01/03/19 0915 01/03/19 0930  BP: (!) 174/85 (!) 156/79  Pulse: 77 65  Resp: (!) 21 16  Temp:    SpO2: 97% 98%    Physical Exam   HEENT-  Normocephalic, no lesions, without obvious abnormality.  Normal external eye and conjunctiva.   Extremities- Warm, dry and intact Musculoskeletal-no joint tenderness, deformity or swelling Skin-warm and dry, no hyperpigmentation, vitiligo, or suspicious lesions    Neuro:  Mental Status: Alert, oriented, thought content appropriate.  Speech fluent without evidence of aphasia.  Able to follow 3 step commands without difficulty. Cranial Nerves: II:  Visual fields : Left eye inferior quadrantanopsia, III,IV, VI: ptosis not present, extra-ocular motions intact bilaterally pupils equal, round, reactive to light and accommodation V,VII: smile symmetric, facial light touch sensation normal bilaterally VIII: hearing normal bilaterally IX,X: Palate rises midline XI: bilateral shoulder shrug XII: midline tongue extension Motor: Right : Upper extremity   5/5    Left:     Upper extremity   5/5  Lower extremity   5/5     Lower extremity   5/5 Tone and bulk:normal tone throughout; no atrophy noted Sensory: Pinprick and light touch intact throughout, bilaterally Deep Tendon Reflexes: 2+ and symmetric throughout Plantars: Right: downgoing   Left: downgoing Cerebellar: normal finger-to-nose, normal    Medications:  Scheduled: . carvedilol  6.25 mg Oral BID WC  . chlorhexidine  15 mL Mouth Rinse BID  . Chlorhexidine Gluconate Cloth  6 each Topical Q0600  . docusate sodium  100 mg Oral BID  . enoxaparin (LOVENOX) injection  40 mg Subcutaneous Q24H  . furosemide  20 mg Intravenous Q12H  . lisinopril  20 mg Oral Daily  . mouth rinse  15 mL Mouth Rinse q12n4p  . nicotine  14 mg Transdermal Daily  . potassium chloride  40 mEq Oral Q4H   Continuous: .  niCARDipine Stopped (01/03/19 1007)    Pertinent Labs/Diagnostics: -Potassium 3.0  Ct Head Wo Contrast  Result Date: 01/02/2019 CLINICAL DATA:  Seizure, new, nontraumatic EXAM:  IMPRESSION: 1. Artifact versus age-indeterminate ischemia at the left occipital cortex. 2. 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 Electronically Signed   By: Monte Fantasia M.D.   On: 01/02/2019 05:37   Mr Brain Wo Contrast  Result Date: 01/02/2019 CLINICAL DATA:  Seizure, new, abnormal neuro exam. IMPRESSION: 1. No emergent finding. Left occipital appearance on head CT was artifactual. 2. Chronic ischemic injury including remote right occipital cortex and left deep gray nuclei infarcts. Electronically Signed   By: Monte Fantasia M.D.   On: 01/02/2019 07:41     Etta Quill PA-C [facility] [phone]   Assessment: Provoked seizure in the setting of hypertensive emergency.  Recommendations: - Blood pressure goal less than 140 -No antiepileptics at this time -- Ophthalmology follow up -Per [facility] statutes, patients with seizures are not allowed to drive until  they have been seizure-free for six months. Use caution when using heavy equipment or power tools. Avoid working on ladders or at heights. Take showers instead of baths. Ensure the water temperature is not too high on the home water heater. Do not go swimming alone. When caring for infants or small children, sit down when holding, feeding, or changing them to minimize risk of injury to the child in the event you have a seizure.   Also, Maintain good sleep hygiene.  Avoid alcohol.   At this time neurology will sign off please call with any questions  01/03/2019, 10:41 AM   NEUROHOSPITALIST ADDENDUM Performed a face to face diagnostic evaluation.   I have reviewed the contents of history and physical exam as documented by PA/ARNP/Resident and agree with above documentation.  I have  discussed and formulated the above plan as documented. Edits to the note have been made as needed.  Pt weaned off cardene and HTN has improved. No further seizures. Recommendations as above. She continues to have blurry vision, most notably in the left eye with a left temporal inferior visual field cut. 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 I suspect her vision will improve after few days of BP control but should follow up with outpatient Ophthalmology.     Karena Addison Roanna Reaves MD [facility] [phone]   If 7pm to 7am, please call on call as listed on AMION.

## 2019-01-03 NOTE — Progress Notes (Signed)
Received call from pts son wishing to speak w/ pt.  Pts son did not have password.  NT relayed to pts son that we must speak w/ pt first and she may call him if she wishes.  Phone placed in pts room and conversation w/ pts son relayed to pt.  Pt instructed how to dial out.

## 2019-01-03 NOTE — Progress Notes (Signed)
Patient arrived to unit. A&O x4. Skin intact. Tele has been placed. Call bell within reach and oriented to room. Seizure precautions in place. Nurse will continue to monitor. Mikes

## 2019-01-04 ENCOUNTER — Inpatient Hospital Stay (HOSPITAL_COMMUNITY): Payer: Self-pay

## 2019-01-04 DIAGNOSIS — R739 Hyperglycemia, unspecified: Secondary | ICD-10-CM

## 2019-01-04 DIAGNOSIS — F17200 Nicotine dependence, unspecified, uncomplicated: Secondary | ICD-10-CM

## 2019-01-04 LAB — BASIC METABOLIC PANEL
Anion gap: 12 (ref 5–15)
BUN: 28 mg/dL — ABNORMAL HIGH (ref 6–20)
CO2: 23 mmol/L (ref 22–32)
Calcium: 9.6 mg/dL (ref 8.9–10.3)
Chloride: 102 mmol/L (ref 98–111)
Creatinine, Ser: 1.48 mg/dL — ABNORMAL HIGH (ref 0.44–1.00)
GFR calc Af Amer: 48 mL/min — ABNORMAL LOW (ref >60)
GFR calc non Af Amer: 41 mL/min — ABNORMAL LOW (ref >60)
Glucose, Bld: 92 mg/dL (ref 70–99)
Potassium: 4.1 mmol/L (ref 3.5–5.1)
Sodium: 137 mmol/L (ref 135–145)

## 2019-01-04 LAB — CBC WITH DIFFERENTIAL/PLATELET
Abs Immature Granulocytes: 0.02 10*3/uL (ref 0.00–0.07)
Basophils Absolute: 0.1 10*3/uL (ref 0.0–0.1)
Basophils Relative: 1 %
Eosinophils Absolute: 0.2 10*3/uL (ref 0.0–0.5)
Eosinophils Relative: 2 %
HCT: 47.6 % — ABNORMAL HIGH (ref 36.0–46.0)
Hemoglobin: 15.4 g/dL — ABNORMAL HIGH (ref 12.0–15.0)
Immature Granulocytes: 0 %
Lymphocytes Relative: 27 %
Lymphs Abs: 2.7 10*3/uL (ref 0.7–4.0)
MCH: 26.8 pg (ref 26.0–34.0)
MCHC: 32.4 g/dL (ref 30.0–36.0)
MCV: 82.9 fL (ref 80.0–100.0)
Monocytes Absolute: 0.7 10*3/uL (ref 0.1–1.0)
Monocytes Relative: 8 %
Neutro Abs: 6.1 10*3/uL (ref 1.7–7.7)
Neutrophils Relative %: 62 %
Platelets: 294 10*3/uL (ref 150–400)
RBC: 5.74 MIL/uL — ABNORMAL HIGH (ref 3.87–5.11)
RDW: 14 % (ref 11.5–15.5)
WBC: 9.8 10*3/uL (ref 4.0–10.5)
nRBC: 0 % (ref 0.0–0.2)

## 2019-01-04 LAB — MAGNESIUM: Magnesium: 2.2 mg/dL (ref 1.7–2.4)

## 2019-01-04 LAB — PHOSPHORUS: Phosphorus: 4.4 mg/dL (ref 2.5–4.6)

## 2019-01-04 MED ORDER — CARVEDILOL 12.5 MG PO TABS
25.0000 mg | ORAL_TABLET | Freq: Two times a day (BID) | ORAL | Status: DC
  Administered 2019-01-04 – 2019-01-05 (×2): 25 mg via ORAL
  Filled 2019-01-04 (×2): qty 2

## 2019-01-04 MED ORDER — HYDROCHLOROTHIAZIDE 25 MG PO TABS
25.0000 mg | ORAL_TABLET | Freq: Every day | ORAL | Status: DC
  Administered 2019-01-04 – 2019-01-05 (×2): 25 mg via ORAL
  Filled 2019-01-04 (×2): qty 1

## 2019-01-04 MED ORDER — SUCRALFATE 1 GM/10ML PO SUSP
1.0000 g | Freq: Three times a day (TID) | ORAL | Status: DC
  Administered 2019-01-04 – 2019-01-05 (×4): 1 g via ORAL
  Filled 2019-01-04 (×4): qty 10

## 2019-01-04 MED ORDER — LISINOPRIL 10 MG PO TABS
10.0000 mg | ORAL_TABLET | Freq: Every day | ORAL | Status: DC
  Administered 2019-01-05: 10 mg via ORAL
  Filled 2019-01-04: qty 1

## 2019-01-04 MED ORDER — PANTOPRAZOLE SODIUM 40 MG PO TBEC
40.0000 mg | DELAYED_RELEASE_TABLET | Freq: Every day | ORAL | Status: DC
  Administered 2019-01-04 – 2019-01-05 (×2): 40 mg via ORAL
  Filled 2019-01-04 (×2): qty 1

## 2019-01-04 NOTE — Progress Notes (Signed)
Tele reported a 7 beat run of V- Tach . MD Arrien notify

## 2019-01-04 NOTE — Evaluation (Signed)
Physical Therapy Evaluation Patient Details Name: Emily Key MRN: [id number] DOB: 1971/02/26 Today's Date: 01/04/2019   History of Present Illness  Pt is a 48 y/o female admitted secondary to seizure activity from hypertensive emergency. MRI is negative for acute abnormality. PMH includes CHF, CKD, drug abuse, HTN, and NSTEMI.   Clinical Impression  Pt admitted secondary to problem above with deficits below. Pt with mild unsteadiness and 1 LOB during gait with vertical head turns. Required min to min guard A for mobility. Pt also complaining of headache throughout. Feel pt will progress well and will not require follow up PT. Will continue to follow acutely to maximize functional mobility independence and safety.     Follow Up Recommendations No PT follow up    Equipment Recommendations  None recommended by PT    Recommendations for Other Services       Precautions / Restrictions Precautions Precautions: Other (comment) Precaution Comments: seizure Restrictions Weight Bearing Restrictions: No      Mobility  Bed Mobility Overal bed mobility: Modified Independent                Transfers Overall transfer level: Needs assistance Equipment used: None Transfers: Sit to/from Stand Sit to Stand: Min guard         General transfer comment: Min guard for safety.   Ambulation/Gait Ambulation/Gait assistance: Min guard;Min assist Gait Distance (Feet): 75 Feet Assistive device: None Gait Pattern/deviations: Step-through pattern;Decreased stride length Gait velocity: Decreased    General Gait Details: Slow, mildly unsteady gait. 1 LOB noted when performing vertical head turns; required min a for steadying. Pt also reports some dizziness with head turns.   Stairs            Wheelchair Mobility    Modified Rankin (Stroke Patients Only)       Balance Overall balance assessment: Needs assistance Sitting-balance support: No upper extremity supported;Feet  supported Sitting balance-Leahy Scale: Good     Standing balance support: No upper extremity supported;During functional activity Standing balance-Leahy Scale: Fair                               Pertinent Vitals/Pain Pain Assessment: 0-10 Pain Score: 8  Pain Location: headache Pain Descriptors / Indicators: Headache Pain Intervention(s): Limited activity within patient's tolerance;Monitored during session;Repositioned    Home Living Family/patient expects to be discharged to:: Private residence Living Arrangements: Other relatives(sister) Available Help at Discharge: Family;Available 24 hours/day Type of Home: House Home Access: Stairs to enter Entrance Stairs-Rails: Right Entrance Stairs-Number of Steps: 2 Home Layout: One level Home Equipment: None      Prior Function Level of Independence: Independent               Hand Dominance        Extremity/Trunk Assessment   Upper Extremity Assessment Upper Extremity Assessment: Overall WFL for tasks assessed    Lower Extremity Assessment Lower Extremity Assessment: Overall WFL for tasks assessed    Cervical / Trunk Assessment Cervical / Trunk Assessment: Normal  Communication   Communication: No difficulties  Cognition Arousal/Alertness: Awake/alert Behavior During Therapy: WFL for tasks assessed/performed Overall Cognitive Status: Within Functional Limits for tasks assessed                                        General Comments  Exercises     Assessment/Plan    PT Assessment Patient needs continued PT services  PT Problem List Decreased balance;Decreased mobility;Decreased activity tolerance;Pain       PT Treatment Interventions Gait training;Stair training;Functional mobility training;Therapeutic activities;Therapeutic exercise;Balance training;Patient/family education    PT Goals (Current goals can be found in the Care Plan section)  Acute Rehab PT  Goals Patient Stated Goal: to feel better PT Goal Formulation: With patient Time For Goal Achievement: 01/18/19 Potential to Achieve Goals: Good    Frequency Min 3X/week   Barriers to discharge        Co-evaluation               AM-PAC PT "6 Clicks" Mobility  Outcome Measure Help needed turning from your back to your side while in a flat bed without using bedrails?: None Help needed moving from lying on your back to sitting on the side of a flat bed without using bedrails?: None Help needed moving to and from a bed to a chair (including a wheelchair)?: A Little Help needed standing up from a chair using your arms (e.g., wheelchair or bedside chair)?: A Little Help needed to walk in hospital room?: A Little Help needed climbing 3-5 steps with a railing? : A Lot 6 Click Score: 19    End of Session Equipment Utilized During Treatment: Gait belt Activity Tolerance: Patient limited by pain Patient left: in chair;with call bell/phone within reach Nurse Communication: Mobility status PT Visit Diagnosis: Unsteadiness on feet (R26.81)    Time: 1313-1330 PT Time Calculation (min) (ACUTE ONLY): 17 min   Charges:   PT Evaluation $PT Eval Low Complexity: [place], PT, DPT  Acute Rehabilitation Services  Pager: [phone] Office: [phone]   Rudean Hitt 01/04/2019, 2:33 PM

## 2019-01-04 NOTE — Progress Notes (Signed)
PROGRESS NOTE    Emily Key  FIE:[id number] DOB: 01-14-1971 DOA: 01/02/2019 PCP: Patient, No Pcp Per    Brief Narrative:  48 year old female who presented with seizures.  She does have significant past medical history for hypertension, chronic seizures and post partum cardiomyopathy.  Apparently she had a witnessed seizure.  On her initial physical examination blood pressure 184/95, 193/111, pulse rate 62-65, respiratory 24-26, oxygen saturation 100%, her lungs were clear to auscultation bilaterally, heart S1-S2 present with me, abdomen was soft tender to palpation left upper quadrant, no lower extremity edema.  Sodium 137, potassium 3.5, chloride 105, bicarb 19, glucose 165, BUN 16, creatinine 1.12, white count 7.5, hemoglobin 14.5, hematocrit 46.1, platelets 248.  SARS COVID-19 was negative.  Urinalysis 6-10 white cells, more than 300 protein.  Toxicology screen positive for tetrahydrocannabinol.  Head CT with artifact versus age-indeterminate ischemia at the left occipital cortex, remote infarcts in the right occipital cortex, left thalamus, and left caudate.  Brain MRI with and no acute stroke, chronic ischemic injury including remote right occipital cortex and left deep gray nuclei infarcts.  Chest x-ray negative for infiltrates, vascular hilar congestion..  EKG 67 bpm, left axis deviation, left anterior fascicular block, sinus rhythm, no ST segment changes, inversions in lead I, aVL, V5 and V6, poor R wave progression, LVH.  Patient was admitted to the hospital working diagnosis of hypertensive urgency complicated by seizure, acute on chronic heart failure decompensation   Assessment & Plan:   Principal Problem:   Hypertensive emergency Active Problems:   Marijuana abuse   Tobacco dependence   Hyperglycemia   Acute on chronic combined systolic and diastolic CHF (congestive heart failure) ([place])   Seizure ([place])   1. Hypertensive emergency. Blood pressure continue to be not well  controlled 168/100, patient with chest pain, telemetry with non sustained VT, 7 beats (personally reviewed telemetry). EF reduced to 40% with diffuse hypokinesis. Will continue lisinopril at 10 mg, and will add diureitic therapy with hctz and increase carvedilol to 25 mg po bid. Continue to monitor telemetry.   2. New onset seizure. Likely due to hypertensive encephalopathy, will continue neuro checks per unit protocol, continue aggressive blood pressure control.  3. AKI. Worsening renal function with serum cr at 1,48 from 0.97, K at 4,1 and serum bicarbonate at 23. Will continue close monitoring of renal function and electrolytes, will dc furosemide and will decrease dose of lisinopril from 20 to 10 mg.   4. Acute on chronic systolic heart failure. Will continue blood pressure control, no signs of volume overload today, will hold on diuretic therapy. Positive non sustained VT, will increase carvedilol and will continue telemetry monitoring.   5. Tobacco abuse. Will continue smoking cessation.  6. Dyspepsia. Burning type chest pain, will add antiacid therapy with pantoprazole and sucralfate.   DVT prophylaxis: enoxaparin   Code Status: full Family Communication: no family at the bedside  Disposition Plan/ discharge barriers: pending clinical improvement.   Body mass index is 25.48 kg/m. Malnutrition Type:      Malnutrition Characteristics:      Nutrition Interventions:     RN Pressure Injury Documentation:     Consultants:   Neurology   Procedures:     Antimicrobials:       Subjective: Patient has positive headache, moderate in intensity, no radiation, no improving or worsening factors, no nausea or vomiting, no further seizures.   Objective: Vitals:   01/03/19 2320 01/04/19 0347 01/04/19 0628 01/04/19 0812  BP: (!) 144/88 Marland Kitchen)  169/91  (!) 172/90  Pulse: 62 (!) 59  64  Resp: 14 17  16   Temp: 97.8 F (36.6 C) (!) 97.5 F (36.4 C)  97.8 F (36.6 C)   TempSrc: Oral Oral  Oral  SpO2: 97% 100%  98%  Weight:   76 kg   Height:        Intake/Output Summary (Last 24 hours) at 01/04/2019 1036 Last data filed at 01/04/2019 0345 Gross per 24 hour  Intake 803.5 ml  Output 800 ml  Net 3.5 ml   Filed Weights   01/02/19 0351 01/03/19 0500 01/04/19 0628  Weight: 86.2 kg 75.8 kg 76 kg    Examination:   General: deconditioned  Neurology: Awake and alert, non focal  E ENT: mild pallor, no icterus, oral mucosa moist Cardiovascular: No JVD. S1-S2 present, rhythmic, no gallops, rubs, or murmurs. No lower extremity edema. Pulmonary: positive breath sounds bilaterally, adequate air movement, no wheezing, rhonchi or rales. Gastrointestinal. Abdomen with no organomegaly, non tender, no rebound or guarding Skin. No rashes Musculoskeletal: no joint deformities     Data Reviewed: I have personally reviewed following labs and imaging studies  CBC: Recent Labs  Lab 01/02/19 0354 01/02/19 1911 01/03/19 0356 01/04/19 0333  WBC 7.5 8.5 10.2 9.8  NEUTROABS 5.4  --  8.2* 6.1  HGB 14.5 15.7* 15.9* 15.4*  HCT 46.1* 47.6* 48.5* 47.6*  MCV 85.8 82.2 81.6 82.9  PLT 248 263 260 793   Basic Metabolic Panel: Recent Labs  Lab 01/02/19 0354 01/02/19 1911 01/03/19 0356 01/04/19 0333  NA 137  --  139 137  K 3.5  --  3.0* 4.1  CL 105  --  103 102  CO2 19*  --  22 23  GLUCOSE 165*  --  102* 92  BUN 16  --  16 28*  CREATININE 1.12* 1.04* 0.97 1.48*  CALCIUM 9.1  --  9.4 9.6  MG  --   --  2.1 2.2  PHOS  --   --  4.7* 4.4   GFR: Estimated Creatinine Clearance: 46.4 mL/min (A) (by C-G formula based on SCr of 1.48 mg/dL (H)). Liver Function Tests: No results for input(s): AST, ALT, ALKPHOS, BILITOT, PROT, ALBUMIN in the last 168 hours. No results for input(s): LIPASE, AMYLASE in the last 168 hours. No results for input(s): AMMONIA in the last 168 hours. Coagulation Profile: No results for input(s): INR, PROTIME in the last 168 hours. Cardiac  Enzymes: No results for input(s): CKTOTAL, CKMB, CKMBINDEX, TROPONINI in the last 168 hours. BNP (last 3 results) No results for input(s): PROBNP in the last 8760 hours. HbA1C: Recent Labs    01/02/19 0354  HGBA1C 5.7*   CBG: No results for input(s): GLUCAP in the last 168 hours. Lipid Profile: No results for input(s): CHOL, HDL, LDLCALC, TRIG, CHOLHDL, LDLDIRECT in the last 72 hours. Thyroid Function Tests: Recent Labs    01/03/19 0356  TSH 0.412   Anemia Panel: No results for input(s): VITAMINB12, FOLATE, FERRITIN, TIBC, IRON, RETICCTPCT in the last 72 hours.    Radiology Studies: I have reviewed all of the imaging during this hospital visit personally     Scheduled Meds: . carvedilol  6.25 mg Oral BID WC  . chlorhexidine  15 mL Mouth Rinse BID  . Chlorhexidine Gluconate Cloth  6 each Topical Q0600  . docusate sodium  100 mg Oral BID  . enoxaparin (LOVENOX) injection  40 mg Subcutaneous Q24H  . furosemide  20 mg Intravenous Q12H  .  lisinopril  20 mg Oral Daily  . mouth rinse  15 mL Mouth Rinse q12n4p  . nicotine  14 mg Transdermal Daily   Continuous Infusions: . niCARDipine Stopped (01/03/19 1007)     LOS: 2 days        Mauricio Gerome Apley, MD

## 2019-01-04 NOTE — TOC Initial Note (Signed)
Transition of Care [facility]) - Initial/Assessment Note    Patient Details  Name: Emily Key MRN: [id number] Date of Birth: 1970/10/28  [facility]) CM/SW Contact:    Geralynn Ochs, LCSW Phone Number: 01/04/2019, 3:51 PM  Clinical Narrative:    CSW met with patient to discuss patient lack of insurance and need for follow-up care. Patient agreeable to be set up with [facility].  Patient appointment scheduled through [place], 01/21/19 at 1:00 PM. Patient advised about using [facility] and [facility] for assistance with medications.                Expected Discharge Plan: Home/Self Care Barriers to Discharge: Continued Medical Work up   Patient Goals and CMS Choice Patient states their goals for this hospitalization and ongoing recovery are:: to get back home      Expected Discharge Plan and Services Expected Discharge Plan: Home/Self Care   Discharge Planning Services: Follow-up appt scheduled, [facility], Medication Assistance   Living arrangements for the past 2 months: Single Family Home Expected Discharge Date: 01/04/19                                    Prior Living Arrangements/Services Living arrangements for the past 2 months: Single Family Home Lives with:: Siblings, Adult Children Patient language and need for interpreter reviewed:: No Do you feel safe going back to the place where you live?: Yes      Need for Family Participation in Patient Care: No (Comment) Care giver support system in place?: Yes (comment)   Criminal Activity/Legal Involvement Pertinent to Current Situation/Hospitalization: No - Comment as needed  Activities of Daily Living Home Assistive Devices/Equipment: None ADL Screening (condition at time of admission) Patient's cognitive ability adequate to safely complete daily activities?: Yes Is the patient deaf or have difficulty hearing?: No Does the patient have difficulty  seeing, even when wearing glasses/contacts?: No Does the patient have difficulty concentrating, remembering, or making decisions?: No Patient able to express need for assistance with ADLs?: Yes Does the patient have difficulty dressing or bathing?: No Independently performs ADLs?: Yes (appropriate for developmental age) Does the patient have difficulty walking or climbing stairs?: No Weakness of Legs: None Weakness of Arms/Hands: None  Permission Sought/Granted                  Emotional Assessment Appearance:: Appears stated age Attitude/Demeanor/Rapport: Engaged Affect (typically observed): Pleasant Orientation: : Oriented to Self, Oriented to Place, Oriented to  Time, Oriented to Situation Alcohol / Substance Use: Not Applicable Psych Involvement: No (comment)  Admission diagnosis:  Seizure ([place]) [R56.9] Hypertensive urgency [I16.0] Hypertensive emergency [I16.1] Patient Active Problem List   Diagnosis Date Noted  . Hypertensive emergency 01/02/2019  . Marijuana abuse 01/02/2019  . Tobacco dependence 01/02/2019  . Hyperglycemia 01/02/2019  . Acute on chronic combined systolic and diastolic CHF (congestive heart failure) ([place]) 01/02/2019  . Seizure ([place]) 01/02/2019  . Adnexal mass 02/18/2015  . Elevated troponin 02/14/2015  . Empyema lung ([place])   . H/O non-ST elevation myocardial infarction (NSTEMI)   . Substance abuse ([place])   . Accelerated hypertension 01/30/2015  . Chronic kidney disease 11/28/2014  . Hypertension 11/26/2014   PCP:  Patient, No Pcp Per Pharmacy:   [facility] ([address]), [place] [address] [place] ([place]) [place] [address] Phone: [phone]  Fax: [phone]     Social Determinants of Health (SDOH) Interventions    Readmission Risk Interventions No flowsheet data found.

## 2019-01-05 LAB — BASIC METABOLIC PANEL
Anion gap: 11 (ref 5–15)
BUN: 26 mg/dL — ABNORMAL HIGH (ref 6–20)
CO2: 23 mmol/L (ref 22–32)
Calcium: 9.6 mg/dL (ref 8.9–10.3)
Chloride: 101 mmol/L (ref 98–111)
Creatinine, Ser: 1.1 mg/dL — ABNORMAL HIGH (ref 0.44–1.00)
GFR calc Af Amer: >60 mL/min (ref >60)
GFR calc non Af Amer: 59 mL/min — ABNORMAL LOW (ref >60)
Glucose, Bld: 103 mg/dL — ABNORMAL HIGH (ref 70–99)
Potassium: 3.6 mmol/L (ref 3.5–5.1)
Sodium: 135 mmol/L (ref 135–145)

## 2019-01-05 LAB — CBC WITH DIFFERENTIAL/PLATELET
Abs Immature Granulocytes: 0.02 10*3/uL (ref 0.00–0.07)
Basophils Absolute: 0 10*3/uL (ref 0.0–0.1)
Basophils Relative: 1 %
Eosinophils Absolute: 0.2 10*3/uL (ref 0.0–0.5)
Eosinophils Relative: 2 %
HCT: 47.3 % — ABNORMAL HIGH (ref 36.0–46.0)
Hemoglobin: 15.4 g/dL — ABNORMAL HIGH (ref 12.0–15.0)
Immature Granulocytes: 0 %
Lymphocytes Relative: 30 %
Lymphs Abs: 2.2 10*3/uL (ref 0.7–4.0)
MCH: 27 pg (ref 26.0–34.0)
MCHC: 32.6 g/dL (ref 30.0–36.0)
MCV: 83 fL (ref 80.0–100.0)
Monocytes Absolute: 0.7 10*3/uL (ref 0.1–1.0)
Monocytes Relative: 10 %
Neutro Abs: 4.3 10*3/uL (ref 1.7–7.7)
Neutrophils Relative %: 57 %
Platelets: 272 10*3/uL (ref 150–400)
RBC: 5.7 MIL/uL — ABNORMAL HIGH (ref 3.87–5.11)
RDW: 13.8 % (ref 11.5–15.5)
WBC: 7.5 10*3/uL (ref 4.0–10.5)
nRBC: 0 % (ref 0.0–0.2)

## 2019-01-05 MED ORDER — LISINOPRIL 10 MG PO TABS: 10.0000 mg | ORAL_TABLET | Freq: Every day | ORAL | 1 refills | Status: DC

## 2019-01-05 MED ORDER — NICOTINE 14 MG/24HR TD PT24: 14.0000 mg | MEDICATED_PATCH | Freq: Every day | TRANSDERMAL | 0 refills | Status: DC

## 2019-01-05 MED ORDER — POTASSIUM CHLORIDE CRYS ER 20 MEQ PO TBCR
40.0000 meq | EXTENDED_RELEASE_TABLET | Freq: Once | ORAL | Status: AC
  Administered 2019-01-05: 12:00:00 40 meq via ORAL
  Filled 2019-01-05: qty 2

## 2019-01-05 MED ORDER — HYDROCHLOROTHIAZIDE 25 MG PO TABS: 25.0000 mg | ORAL_TABLET | Freq: Every day | ORAL | 1 refills | Status: DC

## 2019-01-05 MED ORDER — CARVEDILOL 25 MG PO TABS: 25.0000 mg | ORAL_TABLET | Freq: Two times a day (BID) | ORAL | 2 refills | Status: DC

## 2019-01-05 MED ORDER — POTASSIUM CHLORIDE ER 10 MEQ PO TBCR: 10.0000 meq | EXTENDED_RELEASE_TABLET | Freq: Every day | ORAL | 1 refills | Status: DC

## 2019-01-05 MED ORDER — HYDRALAZINE HCL 50 MG PO TABS: 50.0000 mg | ORAL_TABLET | Freq: Three times a day (TID) | ORAL | 1 refills | Status: DC

## 2019-01-05 NOTE — Care Management (Signed)
Patient provided with MATCH letter.  

## 2019-01-05 NOTE — Progress Notes (Deleted)
Physician Discharge Summary  Emily Key [facility]:[id number] DOB: 29-Nov-1970 DOA: 01/02/2019  PCP: Patient, No Pcp Per  Admit date: 01/02/2019 Discharge date: 01/05/2019  Admitted From: Home Disposition: Home  Recommendations for Outpatient Follow-up:  1. Follow up with PCP in 1-2 weeks 2. Please obtain BMP/CBC and magnesium in one week   Home Health none  equipment/Devices none Discharge Condition stable and improved CODE STATUS: Full code Diet recommendation cardiac Brief/Interim Summary:48 year old female who presented with seizures.  She does have significant past medical history for hypertension, chronic seizures and post partum cardiomyopathy.  Apparently she had a witnessed seizure.  On her initial physical examination blood pressure 184/95, 193/111, pulse rate 62-65, respiratory 24-26, oxygen saturation 100%, her lungs were clear to auscultation bilaterally, heart S1-S2 present with me, abdomen was soft tender to palpation left upper quadrant, no lower extremity edema.  Sodium 137, potassium 3.5, chloride 105, bicarb 19, glucose 165, BUN 16, creatinine 1.12, white count 7.5, hemoglobin 14.5, hematocrit 46.1, platelets 248.  SARS COVID-19 was negative.  Urinalysis 6-10 white cells, more than 300 protein.  Toxicology screen positive for tetrahydrocannabinol.  Head CT with artifact versus age-indeterminate ischemia at the left occipital cortex, remote infarcts in the right occipital cortex, left thalamus, and left caudate.  Brain MRI with and no acute stroke, chronic ischemic injury including remote right occipital cortex and left deep gray nuclei infarcts.  Chest x-ray negative for infiltrates, vascular hilar congestion..  EKG 67 bpm, left axis deviation, left anterior fascicular block, sinus rhythm, no ST segment changes, inversions in lead I, aVL, V5 and V6, poor R wave progression, LVH.  Patient was admitted to the hospital working diagnosis of hypertensive urgency complicated by seizure,  acute on chronic heart failure decompensation  Discharge Diagnoses:  Principal Problem:   Hypertensive emergency Active Problems:   Marijuana abuse   Tobacco dependence   Hyperglycemia   Acute on chronic combined systolic and diastolic CHF (congestive heart failure) (HCC)   Seizure ([place])  1. Hypertensive emergency.  Patient admitted with hypertensive emergency admitted to the ICU treated with IV antihypertensives now started on multiple p.o. medications to control her blood pressure.  Patient reports she did not have the money to buy her medicines that the main reason she did not take it.  I will discharge her on lisinopril 10 mg daily, HCTZ with potassium, carvedilol 25 twice daily and hydralazine 50 mg 3 times a day.  Patient had 4-7 beats of V. tach during this hospital stay.  I discussed this with Dr. Meda Coffee.  She advised me to continue her Coreg and make sure her electrolytes are normal. EF reduced to 40% with diffuse hypokinesis.  2. New onset seizure. 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  Advised her to please continue all her medications at home.  3. AKI.  Functions improved after decreasing the dose of ACE inhibitor.    4. Acute on chronic systolic heart failure.  Renew hydrochlorothiazide no signs of volume overload Lasix was stopped due to AKI.    5. Tobacco abuse.  Continue nicotine patch upon discharge  Estimated body mass index is 26.68 kg/m as calculated from the following:   Height as of this encounter: 5\' 8"  (1.727 m).   Weight as of this encounter: 79.6 kg.  Discharge Instructions  Discharge Instructions    Call MD for:  difficulty breathing, headache or visual disturbances   Complete by: As directed    Call  MD for:  severe uncontrolled pain   Complete by: As directed    Call MD for:  temperature >100.4   Complete by: As directed    Diet - low sodium heart healthy   Complete by: As  directed    Increase activity slowly   Complete by: As directed      Allergies as of 01/05/2019   No Known Allergies     Medication List    STOP taking these medications   cloNIDine 0.2 MG tablet Commonly known as: CATAPRES   hydrALAZINE 50 MG tablet Commonly known as: APRESOLINE     TAKE these medications   carvedilol 25 MG tablet Commonly known as: COREG Take 1 tablet (25 mg total) by mouth 2 (two) times daily with a meal.   hydrochlorothiazide 25 MG tablet Commonly known as: HYDRODIURIL Take 1 tablet (25 mg total) by mouth daily. Start taking on: January 06, 2019   labetalol 100 MG tablet Commonly known as: NORMODYNE Take 1 tablet (100 mg total) by mouth 2 (two) times daily.   lisinopril 10 MG tablet Commonly known as: ZESTRIL Take 1 tablet (10 mg total) by mouth daily. Start taking on: January 06, 2019 What changed:   medication strength  how much to take   nicotine 14 mg/24hr patch Commonly known as: NICODERM CQ - dosed in mg/24 hours Place 1 patch (14 mg total) onto the skin daily. Start taking on: January 06, 2019   potassium chloride 10 MEQ tablet Commonly known as: K-DUR Take 1 tablet (10 mEq total) by mouth daily.      Follow-up [place] on 01/21/2019.   Specialty: Internal Medicine Why: Hospital follow-up appointment scheduled for Monday, January 21, 2019 at 1:00 PM. Please call [phone] if you need to reschedule.  Contact information: [facility].   Why: Please use this location for assistance with your medications. Contact information: [address] [place] [address] [phone]         No Known Allergies  Consultations:  Discussed with Dr. Meda Coffee over the phone   Procedures/Studies: Ct Head Wo Contrast  Result Date: 01/02/2019 CLINICAL DATA:  Seizure,  new, nontraumatic EXAM: CT HEAD WITHOUT CONTRAST TECHNIQUE: Contiguous axial images were obtained from the base of the skull through the vertex without intravenous contrast. COMPARISON:  05/19/2006 FINDINGS: Brain: Interval but chronic appearing right occipital infarct that is moderate size. There is artifact versus age-indeterminate ischemia in the left occipital cortex where the gray-white differentiation is lost. Remote appearing lacunar infarct in the left caudate head and left thalamus. No hemorrhage, hydrocephalus, or masslike finding. Vascular: No hyperdense vessel. Degree of tortuosity at the circle-of-Willis Skull: Negative Sinuses/Orbits: Negative IMPRESSION: 1. Artifact versus age-indeterminate ischemia at the left occipital cortex. 2. 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 Electronically Signed   By: Monte Fantasia M.D.   On: 01/02/2019 05:37   Mr Brain Wo Contrast  Result Date: 01/02/2019 CLINICAL DATA:  Seizure, new, abnormal neuro exam. EXAM: MRI HEAD WITHOUT CONTRAST TECHNIQUE: Multiplanar, multiecho pulse sequences of the brain and surrounding structures were obtained without intravenous contrast. COMPARISON:  Head CT from earlier today FINDINGS: Brain: No acute infarction, hemorrhage, hydrocephalus, extra-axial collection or mass lesion. Small to moderate remote cortical infarct in the paramedian right occipital lobe. Remote lacunar  infarcts in the left caudate head and left thalamus. Chronic small vessel ischemic gliosis in the cerebral white matter. No acute hemorrhage, hydrocephalus, mass, or collection. Vascular: Tortuous vessels likely from chronic hypertension. Major flow voids are preserved Skull and upper cervical spine: Negative for marrow lesion Sinuses/Orbits: Negative IMPRESSION: 1. No emergent finding. Left occipital appearance on head CT was artifactual. 2. Chronic ischemic injury including remote right occipital cortex and left deep gray  nuclei infarcts. Electronically Signed   By: Monte Fantasia M.D.   On: 01/02/2019 07:41   Ct Abdomen Pelvis W Contrast  Result Date: 01/02/2019 CLINICAL DATA:  LEFT upper quadrant abdominal pain for 2 days, history CHF, hypertension, former smoker EXAM: CT ABDOMEN AND PELVIS WITH CONTRAST TECHNIQUE: Multidetector CT imaging of the abdomen and pelvis was performed using the standard protocol following bolus administration of intravenous contrast. Sagittal and coronal MPR images reconstructed from axial data set. CONTRAST:  132mL OMNIPAQUE IOHEXOL 300 MG/ML SOLN IV. No oral contrast. COMPARISON:  02/09/2015 FINDINGS: Lower chest: Minimal bibasilar atelectasis Hepatobiliary: Liver and gallbladder normal appearance Pancreas: Normal appearance Spleen: Normal appearance Adrenals/Urinary Tract: Adrenal glands normal appearance. Multiple BILATERAL nonobstructing small renal calculi. No renal mass or hydronephrosis. Bladder and ureters normal appearance. Stomach/Bowel: Normal appendix, extends posterior to RIGHT psoas stomach and bowel loops normal appearance Vascular/Lymphatic: Atherosclerotic calcification aorta without aneurysm. No adenopathy. Reproductive: Question prior tubal ligation. Uterus and ovaries otherwise unremarkable. Other: Minimal free pelvic fluid. No free air. No hernia or definite inflammatory process. Musculoskeletal: Osseous structures unremarkable. IMPRESSION: Multiple BILATERAL nonobstructing small renal calculi. No acute intra-abdominal or intrapelvic abnormalities visualized. Electronically Signed   By: Lavonia Dana M.D.   On: 01/02/2019 12:23   Dg Chest Port 1 View  Result Date: 01/04/2019 CLINICAL DATA:  Hx Hypertension,seizure,chf EXAM: PORTABLE CHEST - 1 VIEW COMPARISON:  01/02/2019 FINDINGS: Improvement in the central pulmonary vascular congestion and mild interstitial edema. Persistent cardiomegaly. No effusion. Visualized bones unremarkable. IMPRESSION: Cardiomegaly with improvement  in interstitial edema. Electronically Signed   By: Lucrezia Europe M.D.   On: 01/04/2019 09:58   Dg Chest Port 1 View  Result Date: 01/02/2019 CLINICAL DATA:  Hypertensive urgency. EXAM: PORTABLE CHEST 1 VIEW COMPARISON:  CT 02/14/2015.  Chest x-ray 02/09/2015, 12/03/2014. FINDINGS: Mediastinum hilar structures normal. Cardiomegaly. Mild pulmonary venous congestion. Mild bilateral interstitial prominence with Kerley B-lines noted. Mild component CHF may be present. Tiny left pleural effusion cannot be excluded. No pneumothorax. No acute bony abnormality. IMPRESSION: Cardiomegaly with mild pulmonary venous congestion and bilateral interstitial prominence. Findings suggest mild CHF. Small left pleural effusion versus pleural scarring. Electronically Signed   By: Marcello Moores  Register   On: 01/02/2019 12:32    (Echo, Carotid, EGD, Colonoscopy, ERCP)    Subjective:  Resting in bed tearful that she does not have the money to buy these medications Discharge Exam: Vitals:   01/05/19 0344 01/05/19 0735  BP: (!) 167/84 (!) 179/93  Pulse: 65 74  Resp: 17 16  Temp: 97.6 F (36.4 C) 97.9 F (36.6 C)  SpO2: 100% 99%   Vitals:   01/04/19 2336 01/05/19 0344 01/05/19 0421 01/05/19 0735  BP: (!) 159/87 (!) 167/84  (!) 179/93  Pulse: 68 65  74  Resp: 17 17  16   Temp:  97.6 F (36.4 C)  97.9 F (36.6 C)  TempSrc:  Oral  Oral  SpO2: 100% 100%  99%  Weight:   79.6 kg   Height:        General: Pt is  alert, awake, not in acute distress Cardiovascular: RRR, S1/S2 +, no rubs, no gallops Respiratory: CTA bilaterally, no wheezing, no rhonchi Abdominal: Soft, NT, ND, bowel sounds + Extremities: no edema, no cyanosis    The results of significant diagnostics from this hospitalization (including imaging, microbiology, ancillary and laboratory) are listed below for reference.     Microbiology: Recent Results (from the past 240 hour(s))  SARS Coronavirus 2 (CEPHEID - Performed in [facility] hospital lab),  Hosp Order     Status: None   Collection Time: 01/02/19 11:00 AM   Specimen: Nasopharyngeal Swab  Result Value Ref Range Status   SARS Coronavirus 2 NEGATIVE NEGATIVE Final    Comment: (NOTE) If result is NEGATIVE SARS-CoV-2 target nucleic acids are NOT DETECTED. The SARS-CoV-2 RNA is generally detectable in upper and lower  respiratory specimens during the acute phase of infection. The lowest  concentration of SARS-CoV-2 viral copies this assay can detect is 250  copies / mL. A negative result does not preclude SARS-CoV-2 infection  and should not be used as the sole basis for treatment or other  patient management decisions.  A negative result may occur with  improper specimen collection / handling, submission of specimen other  than nasopharyngeal swab, presence of viral mutation(s) within the  areas targeted by this assay, and inadequate number of viral copies  (<250 copies / mL). A negative result must be combined with clinical  observations, patient history, and epidemiological information. If result is POSITIVE SARS-CoV-2 target nucleic acids are DETECTED. The SARS-CoV-2 RNA is generally detectable in upper and lower  respiratory specimens dur ing the acute phase of infection.  Positive  results are indicative of active infection with SARS-CoV-2.  Clinical  correlation with patient history and other diagnostic information is  necessary to determine patient infection status.  Positive results do  not rule out bacterial infection or co-infection with other viruses. If result is PRESUMPTIVE POSTIVE SARS-CoV-2 nucleic acids MAY BE PRESENT.   A presumptive positive result was obtained on the submitted specimen  and confirmed on repeat testing.  While 2019 novel coronavirus  (SARS-CoV-2) nucleic acids may be present in the submitted sample  additional confirmatory testing may be necessary for epidemiological  and / or clinical management purposes  to differentiate between   SARS-CoV-2 and other Sarbecovirus currently known to infect humans.  If clinically indicated additional testing with an alternate test  methodology [phone]) is advised. The SARS-CoV-2 RNA is generally  detectable in upper and lower respiratory sp ecimens during the acute  phase of infection. The expected result is Negative. Fact Sheet for Patients:  StrictlyIdeas.no Fact Sheet for Healthcare Providers: BankingDealers.co.za This test is not yet approved or cleared by the [place] FDA and has been authorized for detection and/or diagnosis of SARS-CoV-2 by FDA under an Emergency Use Authorization (EUA).  This EUA will remain in effect (meaning this test can be used) for the duration of the COVID-19 declaration under Section 564(b)(1) of the Act, 21 U.S.C. section 360bbb-3(b)(1), unless the authorization is terminated or revoked sooner. Performed at [facility], [place] [address] [place], [place] [address]   MRSA PCR Screening     Status: None   Collection Time: 01/02/19  7:45 PM   Specimen: Nasal Mucosa; Nasopharyngeal  Result Value Ref Range Status   MRSA by PCR NEGATIVE NEGATIVE Final    Comment:        The GeneXpert MRSA Assay (FDA approved for NASAL specimens only), is one  component of a comprehensive MRSA colonization surveillance program. It is not intended to diagnose MRSA infection nor to guide or monitor treatment for MRSA infections. Performed at [facility], [place] [address], [place], [place] [address]      Labs: BNP (last 3 results) No results for input(s): BNP in the last 8760 hours. Basic Metabolic Panel: Recent Labs  Lab 01/02/19 0354 01/02/19 1911 01/03/19 0356 01/04/19 0333 01/05/19 0404  NA 137  --  139 137 135  K 3.5  --  3.0* 4.1 3.6  CL 105  --  103 102 101  CO2 19*  --  22 23 23   GLUCOSE 165*  --  102* 92 103*  BUN 16  --  16 28* 26*  CREATININE 1.12* 1.04* 0.97 1.48* 1.10*   CALCIUM 9.1  --  9.4 9.6 9.6  MG  --   --  2.1 2.2  --   PHOS  --   --  4.7* 4.4  --    Liver Function Tests: No results for input(s): AST, ALT, ALKPHOS, BILITOT, PROT, ALBUMIN in the last 168 hours. No results for input(s): LIPASE, AMYLASE in the last 168 hours. No results for input(s): AMMONIA in the last 168 hours. CBC: Recent Labs  Lab 01/02/19 0354 01/02/19 1911 01/03/19 0356 01/04/19 0333 01/05/19 0404  WBC 7.5 8.5 10.2 9.8 7.5  NEUTROABS 5.4  --  8.2* 6.1 4.3  HGB 14.5 15.7* 15.9* 15.4* 15.4*  HCT 46.1* 47.6* 48.5* 47.6* 47.3*  MCV 85.8 82.2 81.6 82.9 83.0  PLT 248 263 260 294 272   Cardiac Enzymes: No results for input(s): CKTOTAL, CKMB, CKMBINDEX, TROPONINI in the last 168 hours. BNP: Invalid input(s): POCBNP CBG: No results for input(s): GLUCAP in the last 168 hours. D-Dimer No results for input(s): DDIMER in the last 72 hours. Hgb A1c No results for input(s): HGBA1C in the last 72 hours. Lipid Profile No results for input(s): CHOL, HDL, LDLCALC, TRIG, CHOLHDL, LDLDIRECT in the last 72 hours. Thyroid function studies Recent Labs    01/03/19 0356  TSH 0.412   Anemia work up No results for input(s): VITAMINB12, FOLATE, FERRITIN, TIBC, IRON, RETICCTPCT in the last 72 hours. Urinalysis    Component Value Date/Time   COLORURINE YELLOW 01/02/2019 0354   APPEARANCEUR CLEAR 01/02/2019 0354   LABSPEC 1.012 01/02/2019 0354   PHURINE 6.0 01/02/2019 0354   GLUCOSEU 150 (A) 01/02/2019 0354   HGBUR SMALL (A) 01/02/2019 0354   BILIRUBINUR NEGATIVE 01/02/2019 0354   KETONESUR NEGATIVE 01/02/2019 0354   PROTEINUR >=300 (A) 01/02/2019 0354   UROBILINOGEN 0.2 02/09/2015 1242   NITRITE NEGATIVE 01/02/2019 0354   LEUKOCYTESUR NEGATIVE 01/02/2019 0354   Sepsis Labs Invalid input(s): PROCALCITONIN,  WBC,  LACTICIDVEN Microbiology Recent Results (from the past 240 hour(s))  SARS Coronavirus 2 (CEPHEID - Performed in [place] hospital lab), Hosp Order     Status:  None   Collection Time: 01/02/19 11:00 AM   Specimen: Nasopharyngeal Swab  Result Value Ref Range Status   SARS Coronavirus 2 NEGATIVE NEGATIVE Final    Comment: (NOTE) If result is NEGATIVE SARS-CoV-2 target nucleic acids are NOT DETECTED. The SARS-CoV-2 RNA is generally detectable in upper and lower  respiratory specimens during the acute phase of infection. The lowest  concentration of SARS-CoV-2 viral copies this assay can detect is 250  copies / mL. A negative result does not preclude SARS-CoV-2 infection  and should not be used as the sole basis for treatment or other  patient management decisions.  A negative result may occur with  improper specimen collection / handling, submission of specimen other  than nasopharyngeal swab, presence of viral mutation(s) within the  areas targeted by this assay, and inadequate number of viral copies  (<250 copies / mL). A negative result must be combined with clinical  observations, patient history, and epidemiological information. If result is POSITIVE SARS-CoV-2 target nucleic acids are DETECTED. The SARS-CoV-2 RNA is generally detectable in upper and lower  respiratory specimens dur ing the acute phase of infection.  Positive  results are indicative of active infection with SARS-CoV-2.  Clinical  correlation with patient history and other diagnostic information is  necessary to determine patient infection status.  Positive results do  not rule out bacterial infection or co-infection with other viruses. If result is PRESUMPTIVE POSTIVE SARS-CoV-2 nucleic acids MAY BE PRESENT.   A presumptive positive result was obtained on the submitted specimen  and confirmed on repeat testing.  While 2019 novel coronavirus  (SARS-CoV-2) nucleic acids may be present in the submitted sample  additional confirmatory testing may be necessary for epidemiological  and / or clinical management purposes  to differentiate between  SARS-CoV-2 and other  Sarbecovirus currently known to infect humans.  If clinically indicated additional testing with an alternate test  methodology [phone]) is advised. The SARS-CoV-2 RNA is generally  detectable in upper and lower respiratory sp ecimens during the acute  phase of infection. The expected result is Negative. Fact Sheet for Patients:  StrictlyIdeas.no Fact Sheet for Healthcare Providers: BankingDealers.co.za This test is not yet approved or cleared by the [place] FDA and has been authorized for detection and/or diagnosis of SARS-CoV-2 by FDA under an Emergency Use Authorization (EUA).  This EUA will remain in effect (meaning this test can be used) for the duration of the COVID-19 declaration under Section 564(b)(1) of the Act, 21 U.S.C. section 360bbb-3(b)(1), unless the authorization is terminated or revoked sooner. Performed at [facility], [place] [address], [place], [place] [address]   MRSA PCR Screening     Status: None   Collection Time: 01/02/19  7:45 PM   Specimen: Nasal Mucosa; Nasopharyngeal  Result Value Ref Range Status   MRSA by PCR NEGATIVE NEGATIVE Final    Comment:        The GeneXpert MRSA Assay (FDA approved for NASAL specimens only), is one component of a comprehensive MRSA colonization surveillance program. It is not intended to diagnose MRSA infection nor to guide or monitor treatment for MRSA infections. Performed at [facility], [place] [address] [place], [place] [address]      Time coordinating discharge:  33  minutes  SIGNED:   Georgette Shell, MD  [facility] 01/05/2019, 10:09 AM Pager   If 7PM-7AM, please contact night-coverage [email or web address] Password [email or web address]

## 2019-01-05 NOTE — Care Management (Signed)
Patient appointment scheduled through [facility], 01/21/19 at 1:00 PM. Patient advised about using [facility] and [facility] for assistance with medications.              If patient DC's over the weekend, will need [place] letter, please call CM, number listed in [place].

## 2019-01-05 NOTE — Progress Notes (Signed)
Pt had 4 beats of v tach, paged on call regarding this, continue to monitor

## 2019-01-05 NOTE — Progress Notes (Signed)
Pt discharge education and instructions completed with pt at bedside; pt voices understanding and denies any questions. Pt IV and telemetry removed; pt discharge home with sister to pick from the hospital to transport to disposition. Pt to pick up electronically sent prescriptions from preferred pharmacy on file. Pt transported off unit via wheelchair with belongings to the side. Delia Heady RN

## 2019-01-06 NOTE — Discharge Summary (Signed)
Physician Discharge Summary  Kaeden Depaz VEH:[id number] DOB: 03/16/1971 DOA: 01/02/2019  PCP: Patient, No Pcp Per  Admit date: 01/02/2019 Discharge date: 01/06/2019  Admitted From: Home Disposition: Home  Recommendations for Outpatient Follow-up:  1. Follow up with PCP in 1-2 weeks 2. Please obtain BMP/CBC in one week   Home Health none Equipment/Devices none Discharge Condition: Stable and improved CODE STATUS full code Diet recommendation: Cardiac Brief/Interim Summary:48 year old female who presented with seizures. She does have significant past medical history for hypertension, chronic seizures and post partum cardiomyopathy. Apparently she had a witnessed seizure. On her initial physical examination blood pressure 184/95, 193/111, pulse rate 62-65, respiratory 24-26, oxygen saturation 100%, her lungs were clear to auscultation bilaterally, heart S1-S2 present with me, abdomen was soft tender to palpation left upper quadrant, no lower extremity edema. Sodium 137, potassium 3.5, chloride 105, bicarb 19, glucose 165, BUN 16, creatinine 1.12, white count 7.5, hemoglobin 14.5, hematocrit 46.1, platelets 248.SARS COVID-19 was negative. Urinalysis 6-10 white cells,more than 300 protein. Toxicology screen positive for tetrahydrocannabinol. Head CT with artifact versus age-indeterminate ischemia at the left occipital cortex, remote infarcts in the right occipital cortex, left thalamus, and left caudate.Brain MRI with and no acute stroke, chronic ischemic injury including remote right occipital cortex and left deep gray nuclei infarcts.Chest x-ray negative for infiltrates,vascular hilar congestion.. EKG 67 bpm, left axis deviation, left anterior fascicular block, sinus rhythm, no ST segment changes, inversions in lead I, aVL, V5 and V6, poor R wave progression, LVH.  Patient was admitted to the hospital working diagnosis of hypertensive urgency complicated by seizure,acute on chronic  heart failure decompensation   Discharge Diagnoses:  Principal Problem:   Hypertensive emergency Active Problems:   Marijuana abuse   Tobacco dependence   Hyperglycemia   Acute on chronic combined systolic and diastolic CHF (congestive heart failure) (HCC)   Seizure ([place])  1. Hypertensive emergency.  Patient admitted with hypertensive emergency admitted to the ICU treated with IV antihypertensives now started on multiple p.o. medications to control her blood pressure.  Patient reports she did not have the money to buy her medicines that the main reason she did not take it.  I will discharge her on lisinopril 10 mg daily, HCTZ with potassium, carvedilol 25 twice daily and hydralazine 50 mg 3 times a day.  Patient had 4-7 beats of V. tach during this hospital stay.  I discussed this with Dr. Meda Coffee.  She advised me to continue her Coreg and make sure her electrolytes are normal. EF reduced to 40% with diffuse hypokinesis.  2. New onset seizure. 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  Advised her to please continue all her medications at home.  3. AKI.  Functions improved after decreasing the dose of ACE inhibitor.    4. Acute on chronic systolic heart failure.  Renew hydrochlorothiazide no signs of volume overload Lasix was stopped due to AKI.    5. Tobacco abuse.  Continue nicotine patch upon discharge    Estimated body mass index is 26.68 kg/m as calculated from the following:   Height as of this encounter: 5\' 8"  (1.727 m).   Weight as of this encounter: 79.6 kg.  Discharge Instructions  Discharge Instructions    Call MD for:  difficulty breathing, headache or visual disturbances   Complete by: As directed    Call MD for:  severe uncontrolled pain   Complete by: As directed    Call MD  for:  temperature >100.4   Complete by: As directed    Diet - low sodium heart healthy   Complete by: As directed     Increase activity slowly   Complete by: As directed      Allergies as of 01/05/2019   No Known Allergies     Medication List    STOP taking these medications   cloNIDine 0.2 MG tablet Commonly known as: CATAPRES   labetalol 100 MG tablet Commonly known as: NORMODYNE     TAKE these medications   carvedilol 25 MG tablet Commonly known as: COREG Take 1 tablet (25 mg total) by mouth 2 (two) times daily with a meal.   hydrALAZINE 50 MG tablet Commonly known as: APRESOLINE Take 1 tablet (50 mg total) by mouth 3 (three) times daily. What changed: when to take this   hydrochlorothiazide 25 MG tablet Commonly known as: HYDRODIURIL Take 1 tablet (25 mg total) by mouth daily.   lisinopril 10 MG tablet Commonly known as: ZESTRIL Take 1 tablet (10 mg total) by mouth daily. What changed:   medication strength  how much to take   nicotine 14 mg/24hr patch Commonly known as: NICODERM CQ - dosed in mg/24 hours Place 1 patch (14 mg total) onto the skin daily.   potassium chloride 10 MEQ tablet Commonly known as: K-DUR Take 1 tablet (10 mEq total) by mouth daily.      Follow-up [facility]. Go on 01/21/2019.   Specialty: Internal Medicine Why: Hospital follow-up appointment scheduled for Monday, January 21, 2019 at 1:00 PM. Please call [phone] if you need to reschedule.  Contact information: [facility].   Why: Please use this location for assistance with your medications. Contact information: [place] [address] [phone]         No Known Allergies  Consultations: none  Procedures/Studies: Ct Head Wo Contrast  Result Date: 01/02/2019 CLINICAL DATA:  Seizure, new, nontraumatic EXAM: CT HEAD WITHOUT CONTRAST TECHNIQUE: Contiguous axial images were obtained from the base of the  skull through the vertex without intravenous contrast. COMPARISON:  05/19/2006 FINDINGS: Brain: Interval but chronic appearing right occipital infarct that is moderate size. There is artifact versus age-indeterminate ischemia in the left occipital cortex where the gray-white differentiation is lost. Remote appearing lacunar infarct in the left caudate head and left thalamus. No hemorrhage, hydrocephalus, or masslike finding. Vascular: No hyperdense vessel. Degree of tortuosity at the circle-of-Willis Skull: Negative Sinuses/Orbits: Negative IMPRESSION: 1. Artifact versus age-indeterminate ischemia at the left occipital cortex. 2. 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 Electronically Signed   By: Monte Fantasia M.D.   On: 01/02/2019 05:37   Mr Brain Wo Contrast  Result Date: 01/02/2019 CLINICAL DATA:  Seizure, new, abnormal neuro exam. EXAM: MRI HEAD WITHOUT CONTRAST TECHNIQUE: Multiplanar, multiecho pulse sequences of the brain and surrounding structures were obtained without intravenous contrast. COMPARISON:  Head CT from earlier today FINDINGS: Brain: No acute infarction, hemorrhage, hydrocephalus, extra-axial collection or mass lesion. Small to moderate remote cortical infarct in the paramedian right occipital lobe. Remote lacunar infarcts in the left caudate head and left thalamus. Chronic small vessel ischemic gliosis in the cerebral white matter. No acute hemorrhage, hydrocephalus, mass, or collection. Vascular: Tortuous vessels likely from chronic hypertension. Major flow voids are  preserved Skull and upper cervical spine: Negative for marrow lesion Sinuses/Orbits: Negative IMPRESSION: 1. No emergent finding. Left occipital appearance on head CT was artifactual. 2. Chronic ischemic injury including remote right occipital cortex and left deep gray nuclei infarcts. Electronically Signed   By: Monte Fantasia M.D.   On: 01/02/2019 07:41   Ct Abdomen Pelvis W  Contrast  Result Date: 01/02/2019 CLINICAL DATA:  LEFT upper quadrant abdominal pain for 2 days, history CHF, hypertension, former smoker EXAM: CT ABDOMEN AND PELVIS WITH CONTRAST TECHNIQUE: Multidetector CT imaging of the abdomen and pelvis was performed using the standard protocol following bolus administration of intravenous contrast. Sagittal and coronal MPR images reconstructed from axial data set. CONTRAST:  114mL OMNIPAQUE IOHEXOL 300 MG/ML SOLN IV. No oral contrast. COMPARISON:  02/09/2015 FINDINGS: Lower chest: Minimal bibasilar atelectasis Hepatobiliary: Liver and gallbladder normal appearance Pancreas: Normal appearance Spleen: Normal appearance Adrenals/Urinary Tract: Adrenal glands normal appearance. Multiple BILATERAL nonobstructing small renal calculi. No renal mass or hydronephrosis. Bladder and ureters normal appearance. Stomach/Bowel: Normal appendix, extends posterior to RIGHT psoas stomach and bowel loops normal appearance Vascular/Lymphatic: Atherosclerotic calcification aorta without aneurysm. No adenopathy. Reproductive: Question prior tubal ligation. Uterus and ovaries otherwise unremarkable. Other: Minimal free pelvic fluid. No free air. No hernia or definite inflammatory process. Musculoskeletal: Osseous structures unremarkable. IMPRESSION: Multiple BILATERAL nonobstructing small renal calculi. No acute intra-abdominal or intrapelvic abnormalities visualized. Electronically Signed   By: Lavonia Dana M.D.   On: 01/02/2019 12:23   Dg Chest Port 1 View  Result Date: 01/04/2019 CLINICAL DATA:  Hx Hypertension,seizure,chf EXAM: PORTABLE CHEST - 1 VIEW COMPARISON:  01/02/2019 FINDINGS: Improvement in the central pulmonary vascular congestion and mild interstitial edema. Persistent cardiomegaly. No effusion. Visualized bones unremarkable. IMPRESSION: Cardiomegaly with improvement in interstitial edema. Electronically Signed   By: Lucrezia Europe M.D.   On: 01/04/2019 09:58   Dg Chest Port 1  View  Result Date: 01/02/2019 CLINICAL DATA:  Hypertensive urgency. EXAM: PORTABLE CHEST 1 VIEW COMPARISON:  CT 02/14/2015.  Chest x-ray 02/09/2015, 12/03/2014. FINDINGS: Mediastinum hilar structures normal. Cardiomegaly. Mild pulmonary venous congestion. Mild bilateral interstitial prominence with Kerley B-lines noted. Mild component CHF may be present. Tiny left pleural effusion cannot be excluded. No pneumothorax. No acute bony abnormality. IMPRESSION: Cardiomegaly with mild pulmonary venous congestion and bilateral interstitial prominence. Findings suggest mild CHF. Small left pleural effusion versus pleural scarring. Electronically Signed   By: Marcello Moores  Register   On: 01/02/2019 12:32    (Echo, Carotid, EGD, Colonoscopy, ERCP)    Subjective: Patient resting in bed no complaints concerned about how she is going to pay for her medications she works in a [employer]  Discharge Exam: Vitals:   01/05/19 1254 01/05/19 1515  BP: (!) 169/91 (!) 167/87  Pulse: 64 69  Resp: 16 16  Temp: 97.8 F (36.6 C)   SpO2: 100% 100%   Vitals:   01/05/19 0735 01/05/19 1125 01/05/19 1254 01/05/19 1515  BP: (!) 179/93 (!) 149/95 (!) 169/91 (!) 167/87  Pulse: 74 73 64 69  Resp: 16 18 16 16   Temp: 97.9 F (36.6 C) 98 F (36.7 C) 97.8 F (36.6 C)   TempSrc: Oral Oral Oral   SpO2: 99% 100% 100% 100%  Weight:      Height:        General: Pt is alert, awake, not in acute distress Cardiovascular: RRR, S1/S2 +, no rubs, no gallops Respiratory: CTA bilaterally, no wheezing, no rhonchi Abdominal: Soft, NT, ND, bowel sounds +  Extremities: no edema, no cyanosis    The results of significant diagnostics from this hospitalization (including imaging, microbiology, ancillary and laboratory) are listed below for reference.     Microbiology: Recent Results (from the past 240 hour(s))  SARS Coronavirus 2 (CEPHEID - Performed in [place] hospital lab), Hosp Order     Status: None   Collection  Time: 01/02/19 11:00 AM   Specimen: Nasopharyngeal Swab  Result Value Ref Range Status   SARS Coronavirus 2 NEGATIVE NEGATIVE Final    Comment: (NOTE) If result is NEGATIVE SARS-CoV-2 target nucleic acids are NOT DETECTED. The SARS-CoV-2 RNA is generally detectable in upper and lower  respiratory specimens during the acute phase of infection. The lowest  concentration of SARS-CoV-2 viral copies this assay can detect is 250  copies / mL. A negative result does not preclude SARS-CoV-2 infection  and should not be used as the sole basis for treatment or other  patient management decisions.  A negative result may occur with  improper specimen collection / handling, submission of specimen other  than nasopharyngeal swab, presence of viral mutation(s) within the  areas targeted by this assay, and inadequate number of viral copies  (<250 copies / mL). A negative result must be combined with clinical  observations, patient history, and epidemiological information. If result is POSITIVE SARS-CoV-2 target nucleic acids are DETECTED. The SARS-CoV-2 RNA is generally detectable in upper and lower  respiratory specimens dur ing the acute phase of infection.  Positive  results are indicative of active infection with SARS-CoV-2.  Clinical  correlation with patient history and other diagnostic information is  necessary to determine patient infection status.  Positive results do  not rule out bacterial infection or co-infection with other viruses. If result is PRESUMPTIVE POSTIVE SARS-CoV-2 nucleic acids MAY BE PRESENT.   A presumptive positive result was obtained on the submitted specimen  and confirmed on repeat testing.  While 2019 novel coronavirus  (SARS-CoV-2) nucleic acids may be present in the submitted sample  additional confirmatory testing may be necessary for epidemiological  and / or clinical management purposes  to differentiate between  SARS-CoV-2 and other Sarbecovirus currently known  to infect humans.  If clinically indicated additional testing with an alternate test  methodology [phone]) is advised. The SARS-CoV-2 RNA is generally  detectable in upper and lower respiratory sp ecimens during the acute  phase of infection. The expected result is Negative. Fact Sheet for Patients:  StrictlyIdeas.no Fact Sheet for Healthcare Providers: BankingDealers.co.za This test is not yet approved or cleared by the [place] FDA and has been authorized for detection and/or diagnosis of SARS-CoV-2 by FDA under an Emergency Use Authorization (EUA).  This EUA will remain in effect (meaning this test can be used) for the duration of the COVID-19 declaration under Section 564(b)(1) of the Act, 21 U.S.C. section 360bbb-3(b)(1), unless the authorization is terminated or revoked sooner. Performed at [facility], [place] [address], [place],  [address]   MRSA PCR Screening     Status: None   Collection Time: 01/02/19  7:45 PM   Specimen: Nasal Mucosa; Nasopharyngeal  Result Value Ref Range Status   MRSA by PCR NEGATIVE NEGATIVE Final    Comment:        The GeneXpert MRSA Assay (FDA approved for NASAL specimens only), is one component of a comprehensive MRSA colonization surveillance program. It is not intended to diagnose MRSA infection nor to guide or monitor treatment for MRSA infections. Performed at [facility], [place] [address], [place], [place] [address]      Labs: BNP (last 3 results) No results for input(s): BNP in the last 8760 hours. Basic Metabolic Panel: Recent Labs  Lab 01/02/19 0354 01/02/19 1911 01/03/19 0356 01/04/19 0333 01/05/19 0404  NA 137  --  139 137 135  K 3.5  --  3.0* 4.1 3.6  CL 105  --  103 102 101  CO2 19*  --  22 23 23   GLUCOSE 967*  --  102* 92 103*  BUN 16  --  16 28* 26*  CREATININE 1.12* 1.04* 0.97 1.48* 1.10*  CALCIUM 9.1  --  9.4 9.6 9.6  MG  --   --  2.1 2.2  --    PHOS  --   --  4.7* 4.4  --    Liver Function Tests: No results for input(s): AST, ALT, ALKPHOS, BILITOT, PROT, ALBUMIN in the last 168 hours. No results for input(s): LIPASE, AMYLASE in the last 168 hours. No results for input(s): AMMONIA in the last 168 hours. CBC: Recent Labs  Lab 01/02/19 0354 01/02/19 1911 01/03/19 0356 01/04/19 0333 01/05/19 0404  WBC 7.5 8.5 10.2 9.8 7.5  NEUTROABS 5.4  --  8.2* 6.1 4.3  HGB 14.5 15.7* 15.9* 15.4* 15.4*  HCT 46.1* 47.6* 48.5* 47.6* 47.3*  MCV 85.8 82.2 81.6 82.9 83.0  PLT 248 263 260 294 272   Cardiac Enzymes: No results for input(s): CKTOTAL, CKMB, CKMBINDEX, TROPONINI in the last 168 hours. BNP: Invalid input(s): POCBNP CBG: No results for input(s): GLUCAP in the last 168 hours. D-Dimer No results for input(s): DDIMER in the last 72 hours. Hgb A1c No results for input(s): HGBA1C in the last 72 hours. Lipid Profile No results for input(s): CHOL, HDL, LDLCALC, TRIG, CHOLHDL, LDLDIRECT in the last 72 hours. Thyroid function studies No results for input(s): TSH, T4TOTAL, T3FREE, THYROIDAB in the last 72 hours.  Invalid input(s): FREET3 Anemia work up No results for input(s): VITAMINB12, FOLATE, FERRITIN, TIBC, IRON, RETICCTPCT in the last 72 hours. Urinalysis    Component Value Date/Time   COLORURINE YELLOW 01/02/2019 0354   APPEARANCEUR CLEAR 01/02/2019 0354   LABSPEC 1.012 01/02/2019 0354   PHURINE 6.0 01/02/2019 0354   GLUCOSEU 150 (A) 01/02/2019 0354   HGBUR SMALL (A) 01/02/2019 0354   BILIRUBINUR NEGATIVE 01/02/2019 0354   KETONESUR NEGATIVE 01/02/2019 0354   PROTEINUR >=300 (A) 01/02/2019 0354   UROBILINOGEN 0.2 02/09/2015 1242   NITRITE NEGATIVE 01/02/2019 0354   LEUKOCYTESUR NEGATIVE 01/02/2019 0354   Sepsis Labs Invalid input(s): PROCALCITONIN,  WBC,  LACTICIDVEN Microbiology Recent Results (from the past 240 hour(s))  SARS Coronavirus 2 (CEPHEID - Performed in [place] hospital lab), Hosp Order      Status: None   Collection Time: 01/02/19 11:00 AM   Specimen: Nasopharyngeal Swab  Result Value Ref Range Status   SARS Coronavirus 2 NEGATIVE NEGATIVE Final    Comment: (NOTE) If result is NEGATIVE SARS-CoV-2 target nucleic acids are NOT DETECTED. The SARS-CoV-2 RNA is generally detectable in upper and lower  respiratory specimens during the acute phase of infection. The lowest  concentration of SARS-CoV-2 viral copies this assay can detect is 250  copies / mL. A negative result does not preclude SARS-CoV-2 infection  and should not be used as the sole basis for treatment or other  patient management decisions.  A negative result may occur with  improper specimen collection / handling, submission of specimen other  than  nasopharyngeal swab, presence of viral mutation(s) within the  areas targeted by this assay, and inadequate number of viral copies  (<250 copies / mL). A negative result must be combined with clinical  observations, patient history, and epidemiological information. If result is POSITIVE SARS-CoV-2 target nucleic acids are DETECTED. The SARS-CoV-2 RNA is generally detectable in upper and lower  respiratory specimens dur ing the acute phase of infection.  Positive  results are indicative of active infection with SARS-CoV-2.  Clinical  correlation with patient history and other diagnostic information is  necessary to determine patient infection status.  Positive results do  not rule out bacterial infection or co-infection with other viruses. If result is PRESUMPTIVE POSTIVE SARS-CoV-2 nucleic acids MAY BE PRESENT.   A presumptive positive result was obtained on the submitted specimen  and confirmed on repeat testing.  While 2019 novel coronavirus  (SARS-CoV-2) nucleic acids may be present in the submitted sample  additional confirmatory testing may be necessary for epidemiological  and / or clinical management purposes  to differentiate between  SARS-CoV-2 and other  Sarbecovirus currently known to infect humans.  If clinically indicated additional testing with an alternate test  methodology [phone]) is advised. The SARS-CoV-2 RNA is generally  detectable in upper and lower respiratory sp ecimens during the acute  phase of infection. The expected result is Negative. Fact Sheet for Patients:  StrictlyIdeas.no Fact Sheet for Healthcare Providers: BankingDealers.co.za This test is not yet approved or cleared by the [place] FDA and has been authorized for detection and/or diagnosis of SARS-CoV-2 by FDA under an Emergency Use Authorization (EUA).  This EUA will remain in effect (meaning this test can be used) for the duration of the COVID-19 declaration under Section 564(b)(1) of the Act, 21 U.S.C. section 360bbb-3(b)(1), unless the authorization is terminated or revoked sooner. Performed at [facility], [place] [address] [place], [place] [address]   MRSA PCR Screening     Status: None   Collection Time: 01/02/19  7:45 PM   Specimen: Nasal Mucosa; Nasopharyngeal  Result Value Ref Range Status   MRSA by PCR NEGATIVE NEGATIVE Final    Comment:        The GeneXpert MRSA Assay (FDA approved for NASAL specimens only), is one component of a comprehensive MRSA colonization surveillance program. It is not intended to diagnose MRSA infection nor to guide or monitor treatment for MRSA infections. Performed at [facility], [place] [address], [place], [place] [address]      Time coordinating discharge:  37 minutes  SIGNED:   Georgette Shell, MD  [facility] 01/06/2019, 11:52 AM Pager   If 7PM-7AM, please contact night-coverage [email or web address] Password [email or web address]

## 2019-01-08 LAB — METANEPHRINES, PLASMA
Metanephrine, Free: 88.4 pg/mL — ABNORMAL HIGH (ref 0.0–88.0)
Normetanephrine, Free: 391.9 pg/mL — ABNORMAL HIGH (ref 0.0–125.8)

## 2019-01-09 DIAGNOSIS — E559 Vitamin D deficiency, unspecified: Secondary | ICD-10-CM

## 2019-01-09 DIAGNOSIS — E785 Hyperlipidemia, unspecified: Secondary | ICD-10-CM

## 2019-01-09 DIAGNOSIS — J302 Other seasonal allergic rhinitis: Secondary | ICD-10-CM

## 2019-01-09 DIAGNOSIS — H6120 Impacted cerumen, unspecified ear: Secondary | ICD-10-CM

## 2019-01-09 HISTORY — DX: Hyperlipidemia, unspecified: E78.5

## 2019-01-09 HISTORY — DX: Vitamin D deficiency, unspecified: E55.9

## 2019-01-09 HISTORY — DX: Other seasonal allergic rhinitis: J30.2

## 2019-01-09 HISTORY — DX: Impacted cerumen, unspecified ear: H61.20

## 2019-01-09 LAB — CATECHOLAMINES,UR.,FREE,24 HR
Dopamine, Rand Ur: 271 ug/L
Dopamine, Ur, 24Hr: 244 ug/24 hr (ref 0–510)
Epinephrine, Rand Ur: 10 ug/L
Epinephrine, U, 24Hr: 9 ug/24 hr (ref 0–20)
Norepinephrine, Rand Ur: 84 ug/L
Norepinephrine,U,24H: 76 ug/24 hr (ref 0–135)
Total Volume: 900

## 2019-01-09 LAB — METANEPHRINES, URINE, 24 HOUR
Metaneph Total, Ur: 193 ug/L
Metanephrines, 24H Ur: 174 ug/24 hr (ref 36–209)
Normetanephrine, 24H Ur: 655 ug/24 hr — ABNORMAL HIGH (ref 131–612)
Normetanephrine, Ur: 728 ug/L
Total Volume: 900

## 2019-01-21 ENCOUNTER — Other Ambulatory Visit: Payer: Self-pay

## 2019-01-21 ENCOUNTER — Encounter: Payer: Self-pay | Admitting: Family Medicine

## 2019-01-21 ENCOUNTER — Ambulatory Visit (INDEPENDENT_AMBULATORY_CARE_PROVIDER_SITE_OTHER): Payer: Medicaid Other | Admitting: Family Medicine

## 2019-01-21 VITALS — BP 146/82 | HR 68 | Temp 97.6°F | Ht 68.0 in | Wt 180.0 lb

## 2019-01-21 DIAGNOSIS — Z09 Encounter for follow-up examination after completed treatment for conditions other than malignant neoplasm: Secondary | ICD-10-CM

## 2019-01-21 DIAGNOSIS — Z0000 Encounter for general adult medical examination without abnormal findings: Secondary | ICD-10-CM

## 2019-01-21 DIAGNOSIS — Z7689 Persons encountering health services in other specified circumstances: Secondary | ICD-10-CM

## 2019-01-21 DIAGNOSIS — I161 Hypertensive emergency: Secondary | ICD-10-CM

## 2019-01-21 DIAGNOSIS — Z124 Encounter for screening for malignant neoplasm of cervix: Secondary | ICD-10-CM

## 2019-01-21 DIAGNOSIS — H6123 Impacted cerumen, bilateral: Secondary | ICD-10-CM | POA: Insufficient documentation

## 2019-01-21 DIAGNOSIS — J302 Other seasonal allergic rhinitis: Secondary | ICD-10-CM | POA: Insufficient documentation

## 2019-01-21 DIAGNOSIS — Z1239 Encounter for other screening for malignant neoplasm of breast: Secondary | ICD-10-CM

## 2019-01-21 LAB — POCT URINALYSIS DIP (MANUAL ENTRY)
Bilirubin, UA: NEGATIVE
Blood, UA: NEGATIVE
Glucose, UA: NEGATIVE mg/dL
Ketones, POC UA: NEGATIVE mg/dL
Leukocytes, UA: NEGATIVE
Nitrite, UA: NEGATIVE
Protein Ur, POC: NEGATIVE mg/dL
Spec Grav, UA: 1.025 (ref 1.010–1.025)
Urobilinogen, UA: 0.2 E.U./dL
pH, UA: 6 (ref 5.0–8.0)

## 2019-01-21 MED ORDER — CETIRIZINE HCL 10 MG PO TABS: 10.0000 mg | ORAL_TABLET | Freq: Every day | ORAL | 11 refills | Status: DC

## 2019-01-21 NOTE — Progress Notes (Signed)
Patient Emily Key   New Patient--Hospital Follow Up--Establish Key  Subjective:  Patient ID: Emily Key, female    DOB: [birth year]  Age: 48 y.o. MRN: [id number]  CC:  Chief Complaint  Patient presents with  . Establish Key  . Hospitalization Follow-up    HPI [name] is a 48 year old female who presents for Hospital Follow Up and to Establish Key.  Past Medical History:  Diagnosis Date  . Cerumen impaction 01/2019  . CHF (congestive heart failure) ([place])    after last pregnancy  . Daily headache   . Heart murmur    "born w/one":  . History of hiatal hernia   . Hypertension   . Hypertensive emergency 01/02/2019  . Observed seizure-like activity ([place]) 01/02/2019  . Pneumonia 11/2014   Current Status: This will be my initial office visit with [name] today. She admits that she has never had a PCP and is ready to take Key of of her health. Since her last Hospital Admission on 01/02/2019 for Hypertensive Emergency. Today, she is doing well with no complaints. She denies visual changes, chest pain, cough, shortness of breath, heart palpitations, and falls. She has occasional headaches and dizziness with position changes. Denies severe headaches, confusion, seizures, double vision, and blurred vision, nausea and vomiting. Her anxiety is mild today. She denies suicidal ideations, homicidal ideations, or auditory hallucinations. She has c/o right ear pain X 1 month now. She has not used any medications or drops in her ear to aide in relief of her symptoms. She denies ear discharge.   She denies fevers, chills, fatigue, recent infections, weight loss, and night sweats. No reports of GI problems such as diarrhea, and constipation. She has no reports of blood in stools, dysuria and hematuria.   Past Surgical History:  Procedure Laterality Date  . CARDIAC CATHETERIZATION N/A 02/13/2015   Procedure: Left Heart Cath and Coronary  Angiography;  Surgeon: Troy Sine, MD;  Location: [facility];  Service: Cardiovascular;  Laterality: N/A;  . PLEURAL EFFUSION DRAINAGE Left 11/14/2014   Procedure: DRAINAGE OF LEFT PLEURAL EFFUSION;  Surgeon: Melrose Nakayama, MD;  Location: [facility];  Service: Thoracic;  Laterality: Left;  . TONSILLECTOMY AND ADENOIDECTOMY  ~ 1986  . TUBAL LIGATION  2000  . VIDEO ASSISTED THORACOSCOPY (VATS)/DECORTICATION Left 11/14/2014   Procedure: LEFT VIDEO ASSISTED THORACOSCOPY (VATS)/DECORTICATION;  Surgeon: Melrose Nakayama, MD;  Location: [facility];  Service: Thoracic;  Laterality: Left;    Family History  Problem Relation Age of Onset  . Diabetes Mellitus II Mother   . Hypertension Mother   . Lung cancer Father     Social History   Socioeconomic History  . Marital status: Divorced    Spouse name: Not on file  . Number of children: Not on file  . Years of education: Not on file  . Highest education level: Not on file  Occupational History  . Not on file  Social Needs  . Financial resource strain: Not on file  . Food insecurity    Worry: Not on file    Inability: Not on file  . Transportation needs    Medical: Not on file    Non-medical: Not on file  Tobacco Use  . Smoking status: Former Smoker    Packs/day: 0.33    Years: 10.00    Pack years: 3.30    Types: Cigarettes    Quit date: 11/11/2014    Years  since quitting: 4.1  . Smokeless tobacco: Never Used  Substance and Sexual Activity  . Alcohol use: Yes    Alcohol/week: 0.0 standard drinks    Comment: 8/1/20176 "a couple beers a couple times/month"  . Drug use: No  . Sexual activity: Yes  Lifestyle  . Physical activity    Days per week: Not on file    Minutes per session: Not on file  . Stress: Not on file  Relationships  . Social Herbalist on phone: Not on file    Gets together: Not on file    Attends religious service: Not on file    Active member of club or organization: Not on file    Attends  meetings of clubs or organizations: Not on file    Relationship status: Not on file  . Intimate partner violence    Fear of current or ex partner: Not on file    Emotionally abused: Not on file    Physically abused: Not on file    Forced sexual activity: Not on file  Other Topics Concern  . Not on file  Social History Narrative  . Not on file    Outpatient Medications Prior to Visit  Medication Sig Dispense Refill  . carvedilol (COREG) 25 MG tablet Take 1 tablet (25 mg total) by mouth 2 (two) times daily with a meal. 120 tablet 2  . hydrALAZINE (APRESOLINE) 50 MG tablet Take 1 tablet (50 mg total) by mouth 3 (three) times daily. 90 tablet 1  . hydrochlorothiazide (HYDRODIURIL) 25 MG tablet Take 1 tablet (25 mg total) by mouth daily. 30 tablet 1  . lisinopril (ZESTRIL) 10 MG tablet Take 1 tablet (10 mg total) by mouth daily. 30 tablet 1  . potassium chloride (K-DUR) 10 MEQ tablet Take 1 tablet (10 mEq total) by mouth daily. 30 tablet 1  . nicotine (NICODERM CQ - DOSED IN MG/24 HOURS) 14 mg/24hr patch Place 1 patch (14 mg total) onto the skin daily. (Patient not taking: Reported on 01/21/2019) 28 patch 0   No facility-administered medications prior to visit.     No Known Allergies  ROS Review of Systems  Constitutional: Negative.   HENT: Positive for ear pain (right ear).   Eyes: Negative.   Respiratory: Negative.   Cardiovascular: Negative.   Gastrointestinal: Negative.   Endocrine: Negative.   Genitourinary: Negative.   Musculoskeletal: Negative.   Skin: Negative.   Allergic/Immunologic: Negative.   Neurological: Positive for dizziness (occasional ) and headaches (Occasional).  Hematological: Negative.   Psychiatric/Behavioral: Negative.       Objective:    Physical Exam  Constitutional: She is oriented to person, place, and time. She appears well-developed and well-nourished.  HENT:  Head: Normocephalic and atraumatic.  Bilateral ear impaction Right ear pain   Eyes: Conjunctivae are normal.  Neck: Normal range of motion. Neck supple.  Cardiovascular: Normal rate, regular rhythm, normal heart sounds and intact distal pulses.  Pulmonary/Chest: Effort normal and breath sounds normal.  Abdominal: Soft. Bowel sounds are normal.  Neurological: She is alert and oriented to person, place, and time. She has normal reflexes.  Skin: Skin is warm and dry.  Psychiatric: She has a normal mood and affect. Her behavior is normal. Judgment and thought content normal.  Nursing note and vitals reviewed.   BP (!) 146/82   Pulse 68   Temp 97.6 F (36.4 C) (Oral)   Ht 5\' 8"  (1.727 m)   Wt 180 lb (81.6 kg)  SpO2 98%   BMI 27.37 kg/m  Wt Readings from Last 3 Encounters:  01/21/19 180 lb (81.6 kg)  01/05/19 175 lb 7.8 oz (79.6 kg)  04/10/15 193 lb (87.5 kg)    BP Readings from Last 3 Encounters:  01/21/19 (!) 146/82  01/05/19 (!) 167/87  04/10/15 (!) 135/91    Health Maintenance Due  Topic Date Due  . PAP SMEAR-Modifier  05/17/1990    There are no preventive Key reminders to display for this patient.  Lab Results  Component Value Date   TSH 0.412 01/03/2019   Lab Results  Component Value Date   WBC 7.5 01/05/2019   HGB 15.4 (H) 01/05/2019   HCT 47.3 (H) 01/05/2019   MCV 83.0 01/05/2019   PLT 272 01/05/2019   Lab Results  Component Value Date   NA 135 01/05/2019   K 3.6 01/05/2019   CO2 23 01/05/2019   GLUCOSE 103 (H) 01/05/2019   BUN 26 (H) 01/05/2019   CREATININE 1.10 (H) 01/05/2019   BILITOT 0.4 02/15/2015   ALKPHOS 31 (L) 02/15/2015   AST 41 02/15/2015   ALT 13 (L) 02/15/2015   PROT 6.0 (L) 02/15/2015   ALBUMIN 3.0 (L) 02/15/2015   CALCIUM 9.6 01/05/2019   ANIONGAP 11 01/05/2019   Lab Results  Component Value Date   CHOL 120 11/12/2014   No results found for: HDL No results found for: LDLCALC No results found for: TRIG No results found for: CHOLHDL Lab Results  Component Value Date   HGBA1C 5.7 (H) 01/02/2019     Assessment & Plan:   1. Hospital discharge follow-up  2. Encounter to establish Key - POCT urinalysis dipstick  3. Hypertensive emergency Blood pressure is stable today. She will continue to decrease high sodium intake, excessive alcohol intake, increase potassium intake, smoking cessation, and increase physical activity of at least 30 minutes of cardio activity daily. She will continue to follow Heart Healthy or DASH diet.  4. Bilateral impacted cerumen Earwax removed successfully with no complaints or concerns. Patient tolerated procedure well. Inner ear canal mild redness noted post procedure. Patient to place mineral oil onto cotton ball and insert into his ear canal weekly, to possibly prevent future incidents of earwax buildup.  - Ear wax removal  5. Seasonal allergies We will initiate Zyrtec today. - cetirizine (ZYRTEC) 10 MG tablet; Take 1 tablet (10 mg total) by mouth daily.  Dispense: 30 tablet; Refill: 11  6. Breast cancer screening We will discuss breast cancer screening at next office visit.   7. Screening for cervical cancer We will discuss scheduling Pap Smear at next office visit.   8. Healthcare maintenance - Lipid Panel - TSH - Vitamin D, 25-hydroxy - Vitamin B12  9. Follow up She will follow up in 1 month.  Meds ordered this encounter  Medications  . cetirizine (ZYRTEC) 10 MG tablet    Sig: Take 1 tablet (10 mg total) by mouth daily.    Dispense:  30 tablet    Refill:  11    Orders Placed This Encounter  Procedures  . Lipid Panel  . TSH  . Vitamin D, 25-hydroxy  . Vitamin B12  . Ear wax removal  . POCT urinalysis dipstick    Referral Orders  No referral(s) requested today    Kathe Becton,  MSN, FNP-BC Patient [place], [place] [phone]  Problem List Items Addressed This Visit      Cardiovascular  and Mediastinum   Hypertensive emergency    Other Visit Diagnoses     Hospital discharge follow-up    -  Primary   Encounter to establish Key       Relevant Orders   POCT urinalysis dipstick (Completed)   Bilateral impacted cerumen       Relevant Orders   Ear wax removal   Seasonal allergies       Relevant Medications   cetirizine (ZYRTEC) 10 MG tablet   Breast cancer screening       Screening for cervical cancer       Healthcare maintenance       Relevant Orders   Lipid Panel   TSH   Vitamin D, 25-hydroxy   Vitamin B12   Follow up          Meds ordered this encounter  Medications  . cetirizine (ZYRTEC) 10 MG tablet    Sig: Take 1 tablet (10 mg total) by mouth daily.    Dispense:  30 tablet    Refill:  11    Follow-up: Return in about 1 month (around 02/21/2019).    Azzie Glatter, FNP

## 2019-01-21 NOTE — Patient Instructions (Signed)
Allergies, Adult An allergy is when your body's defense system (immune system) overreacts to an otherwise harmless substance (allergen) that you breathe in or eat or something that touches your skin. When you come into contact with something that you are allergic to, your immune system produces certain proteins (antibodies). These proteins cause cells to release chemicals (histamines) that trigger the symptoms of an allergic reaction. Allergies often affect the nasal passages (allergic rhinitis), eyes (allergic conjunctivitis), skin (atopic dermatitis), and stomach. Allergies can be mild or severe. Allergies cannot spread from person to person (are not contagious). They can develop at any age and may be outgrown. What increases the risk? You may be at greater risk of allergies if other people in your family have allergies. What are the signs or symptoms? Symptoms depend on what type of allergy you have. They may include:  Runny, stuffy nose.  Sneezing.  Itchy mouth, ears, or throat.  Postnasal drip.  Sore throat.  Itchy, red, watery, or puffy eyes.  Skin rash or hives.  Stomach pain.  Vomiting.  Diarrhea.  Bloating.  Wheezing or coughing. People with a severe allergy to food, medicine, or an insect bite may have a life-threatening allergic reaction (anaphylaxis). Symptoms of anaphylaxis include:  Hives.  Itching.  Flushed face.  Swollen lips, tongue, or mouth.  Tight or swollen throat.  Chest pain or tightness in the chest.  Trouble breathing or shortness of breath.  Rapid heartbeat.  Dizziness or fainting.  Vomiting.  Diarrhea.  Pain in the abdomen. How is this diagnosed? This condition is diagnosed based on:  Your symptoms.  Your family and medical history.  A physical exam. You may need to see a health care provider who specializes in treating allergies (allergist). You may also have tests, including:  Skin tests to see which allergens are causing  your symptoms, such as: ? Skin prick test. In this test, your skin is pricked with a tiny needle and exposed to small amounts of possible allergens to see if your skin reacts. ? Intradermal skin test. In this test, a small amount of allergen is injected under your skin to see if your skin reacts. ? Patch test. In this test, a small amount of allergen is placed on your skin and then your skin is covered with a bandage. Your health care provider will check your skin after a couple of days to see if a rash has developed.  Blood tests.  Challenges tests. In this test, you inhale a small amount of allergen by mouth to see if you have an allergic reaction. You may also be asked to:  Keep a food diary. A food diary is a record of all the foods and drinks you have in a day and any symptoms you experience.  Practice an elimination diet. An elimination diet involves eliminating specific foods from your diet and then adding them back in one by one to find out if a certain food causes an allergic reaction. How is this treated? Treatment for allergies depends on your symptoms. Treatment may include:  Cold compresses to soothe itching and swelling.  Eye drops.  Nasal sprays.  Using a saline spray or container (neti pot) to flush out the nose (nasal irrigation). These methods can help clear away mucus and keep the nasal passages moist.  Using a humidifier.  Oral antihistamines or other medicines to block allergic reaction and inflammation.  Skin creams to treat rashes or itching.  Diet changes to eliminate food allergy triggers.  Repeated exposure to tiny amounts of allergens to build up a tolerance and prevent future allergic reactions (immunotherapy). These include: ? Allergy shots. ? Oral treatment. This involves taking small doses of an allergen under the tongue (sublingual immunotherapy).  Emergency epinephrine injection (auto-injector) in case of an allergic emergency. This is a  self-injectable, pre-measured medicine that must be given within the first few minutes of a serious allergic reaction. Follow these instructions at home:         Avoid known allergens whenever possible.  If you suffer from airborne allergens, wash out your nose daily. You can do this with a saline spray or a neti pot to flush out your nose (nasal irrigation).  Take over-the-counter and prescription medicines only as told by your health care provider.  Keep all follow-up visits as told by your health care provider. This is important.  If you are at risk of a severe allergic reaction (anaphylaxis), keep your auto-injector with you at all times.  If you have ever had anaphylaxis, wear a medical alert bracelet or necklace that states you have a severe allergy. Contact a health care provider if:  Your symptoms do not improve with treatment. Get help right away if:  You have symptoms of anaphylaxis, such as: ? Swollen mouth, tongue, or throat. ? Pain or tightness in your chest. ? Trouble breathing or shortness of breath. ? Dizziness or fainting. ? Severe abdominal pain, vomiting, or diarrhea. This information is not intended to replace advice given to you by your health care provider. Make sure you discuss any questions you have with your health care provider. Document Released: 09/20/2002 Document Revised: 09/20/2017 Document Reviewed: 01/13/2016 Elsevier Patient Education  2020 [employer]. Hypertension, Adult Hypertension is another name for high blood pressure. High blood pressure forces your heart to work harder to pump blood. This can cause problems over time. There are two numbers in a blood pressure reading. There is a top number (systolic) over a bottom number (diastolic). It is best to have a blood pressure that is below 120/80. Healthy choices can help lower your blood pressure, or you may need medicine to help lower it. What are the causes? The cause of this condition is  not known. Some conditions may be related to high blood pressure. What increases the risk?  Smoking.  Having type 2 diabetes mellitus, high cholesterol, or both.  Not getting enough exercise or physical activity.  Being overweight.  Having too much fat, sugar, calories, or salt (sodium) in your diet.  Drinking too much alcohol.  Having long-term (chronic) kidney disease.  Having a family history of high blood pressure.  Age. Risk increases with age.  Race. You may be at higher risk if you are African American.  Gender. Men are at higher risk than women before age 11. After age 52, women are at higher risk than men.  Having obstructive sleep apnea.  Stress. What are the signs or symptoms?  High blood pressure may not cause symptoms. Very high blood pressure (hypertensive crisis) may cause: ? Headache. ? Feelings of worry or nervousness (anxiety). ? Shortness of breath. ? Nosebleed. ? A feeling of being sick to your stomach (nausea). ? Throwing up (vomiting). ? Changes in how you see. ? Very bad chest pain. ? Seizures. How is this treated?  This condition is treated by making healthy lifestyle changes, such as: ? Eating healthy foods. ? Exercising more. ? Drinking less alcohol.  Your health care provider may prescribe medicine if  lifestyle changes are not enough to get your blood pressure under control, and if: ? Your top number is above 130. ? Your bottom number is above 80.  Your personal target blood pressure may vary. Follow these instructions at home: Eating and drinking   If told, follow the DASH eating plan. To follow this plan: ? Fill one half of your plate at each meal with fruits and vegetables. ? Fill one fourth of your plate at each meal with whole grains. Whole grains include whole-wheat pasta, brown rice, and whole-grain bread. ? Eat or drink low-fat dairy products, such as skim milk or low-fat yogurt. ? Fill one fourth of your plate at each  meal with low-fat (lean) proteins. Low-fat proteins include fish, chicken without skin, eggs, beans, and tofu. ? Avoid fatty meat, cured and processed meat, or chicken with skin. ? Avoid pre-made or processed food.  Eat less than 1,500 mg of salt each day.  Do not drink alcohol if: ? Your doctor tells you not to drink. ? You are pregnant, may be pregnant, or are planning to become pregnant.  If you drink alcohol: ? Limit how much you use to:  0-1 drink a day for women.  0-2 drinks a day for men. ? Be aware of how much alcohol is in your drink. In the [place] one drink equals one 12 oz bottle of beer (355 mL), one 5 oz glass of wine (148 mL), or one 1 oz glass of hard liquor (44 mL). Lifestyle   Work with your doctor to stay at a healthy weight or to lose weight. Ask your doctor what the best weight is for you.  Get at least 30 minutes of exercise most days of the week. This may include walking, swimming, or biking.  Get at least 30 minutes of exercise that strengthens your muscles (resistance exercise) at least 3 days a week. This may include lifting weights or doing Pilates.  Do not use any products that contain nicotine or tobacco, such as cigarettes, e-cigarettes, and chewing tobacco. If you need help quitting, ask your doctor.  Check your blood pressure at home as told by your doctor.  Keep all follow-up visits as told by your doctor. This is important. Medicines  Take over-the-counter and prescription medicines only as told by your doctor. Follow directions carefully.  Do not skip doses of blood pressure medicine. The medicine does not work as well if you skip doses. Skipping doses also puts you at risk for problems.  Ask your doctor about side effects or reactions to medicines that you should watch for. Contact a doctor if you:  Think you are having a reaction to the medicine you are taking.  Have headaches that keep coming back (recurring).  Feel dizzy.  Have  swelling in your ankles.  Have trouble with your vision. Get help right away if you:  Get a very bad headache.  Start to feel mixed up (confused).  Feel weak or numb.  Feel faint.  Have very bad pain in your: ? Chest. ? Belly (abdomen).  Throw up more than once.  Have trouble breathing. Summary  Hypertension is another name for high blood pressure.  High blood pressure forces your heart to work harder to pump blood.  For most people, a normal blood pressure is less than 120/80.  Making healthy choices can help lower blood pressure. If your blood pressure does not get lower with healthy choices, you may need to take medicine. This information is  not intended to replace advice given to you by your health care provider. Make sure you discuss any questions you have with your health care provider. Document Released: 12/14/2007 Document Revised: 03/07/2018 Document Reviewed: 03/07/2018 Elsevier Patient Education  2020 [employer] Irrigation Ear irrigation is a procedure to wash dirt and wax out of your ear canal. This procedure is also called lavage. You may need ear irrigation if you are having trouble hearing because of a buildup of earwax. You may also have ear irrigation as part of the treatment for an ear infection. Getting wax and dirt out of your ear canal can help some medicines (ear drops) work better. How is ear irrigation performed? The procedure may vary among health care providers and hospitals. In general:  You may be given ear drops to put in your ear 15-20 minutes before irrigation. This helps loosen the wax.  A syringe containing water or a sterile salt solution (saline) can be gently inserted into the ear canal. The saline is used to flush out wax and other debris. Ear irrigation kits are also available for use at home. Ask your health care provider if this is an option for you. Use a home irrigation kit only as told by your health care provider. Read the  package instructions carefully. Follow the directions for using the syringe. Use water that is room temperature. Do not do ear irrigation at home if you:  Have diabetes. Diabetes increases the risk of infection.  Have a hole or tear in your eardrum.  Have tubes in your ears.  Have had any ear surgery in the past.  Have been instructed not to irrigate your ears. What are the risks of ear irrigation? Generally, this is a safe procedure. However, problems may occur, including:  Infection.  Pain.  Hearing loss.  Pushing water and debris into the eardrum. This can occur if there are holes in the eardrum.  Ear irrigation failing to work. How should I care for my ears after irrigation? After an ear irrigation, follow instructions given to you by your healthcare provider. Cleaning   Clean the outside of your ear with a soft washcloth daily.  If told by your health care provider, use a few drops of baby oil, mineral oil, glycerin, hydrogen peroxide, or over-the-counter earwax softening drops.  Do not use cotton swabs to clean your ears. These can push wax down into the ear canal.  Do not put anything into your ears to try to remove wax. This includes ear candles. General instructions  Take over-the-counter and prescription medicines only as told by your health care provider.  If you were prescribed an antibiotic medicine, use it as told by your health care provider. Do not stop using the antibiotic even if your condition improves.  Keep all follow-up visits as told by your health care provider. This is important.  Visit your health care provider at least once a year to have your ears and hearing checked. Follow these instructions at home:  Keep the ear clean and dry by following the instructions from your healthcare provider. Contact a health care provider if:  Your hearing is not improving or is getting worse.  You have pain or redness in your ear.  You are dizzy.  You  have ringing in your ears.  You have nausea or vomiting.  You have fluid, blood, or pus coming out of your ear. Summary  Ear irrigation is a procedure to wash dirt and wax out of your  ear canal. This procedure is also called lavage.  To perform ear irrigation, ear drops may be put in your ear 15-20 minutes before irrigation. Water or sterile salt solution (saline) will be used to flush out wax and other debris.  You may be able to irrigate your ears at home. Ask your health care provider if this is an option for you. Follow your health care provider's instructions.  Clean your ears with a soft cloth after irrigation. Do not use cotton swabs to clean your ears. These can push wax down into the ear canal. This information is not intended to replace advice given to you by your health care provider. Make sure you discuss any questions you have with your health care provider. Document Released: 07/24/2015 Document Revised: 03/26/2018 Document Reviewed: 03/26/2018 Elsevier Patient Education  2020 [employer].

## 2019-01-22 LAB — LIPID PANEL
Chol/HDL Ratio: 3.1 ratio (ref 0.0–4.4)
Cholesterol, Total: 220 mg/dL — ABNORMAL HIGH (ref 100–199)
HDL: 72 mg/dL (ref >39)
LDL Calculated: 126 mg/dL — ABNORMAL HIGH (ref 0–99)
Triglycerides: 109 mg/dL (ref 0–149)
VLDL Cholesterol Cal: 22 mg/dL (ref 5–40)

## 2019-01-22 LAB — VITAMIN D 25 HYDROXY (VIT D DEFICIENCY, FRACTURES): Vit D, 25-Hydroxy: 6.8 ng/mL — ABNORMAL LOW (ref 30.0–100.0)

## 2019-01-22 LAB — VITAMIN B12: Vitamin B-12: 545 pg/mL (ref 232–1245)

## 2019-01-22 LAB — TSH: TSH: 0.873 u[IU]/mL (ref 0.450–4.500)

## 2019-01-24 ENCOUNTER — Other Ambulatory Visit: Payer: Self-pay | Admitting: Family Medicine

## 2019-01-24 ENCOUNTER — Encounter: Payer: Self-pay | Admitting: Family Medicine

## 2019-01-24 DIAGNOSIS — E559 Vitamin D deficiency, unspecified: Secondary | ICD-10-CM

## 2019-01-24 MED ORDER — VITAMIN D (ERGOCALCIFEROL) 1.25 MG (50000 UNIT) PO CAPS: 50000.0000 [IU] | ORAL_CAPSULE | ORAL | 3 refills | Status: DC

## 2019-02-15 ENCOUNTER — Telehealth: Payer: Self-pay

## 2019-02-15 NOTE — Telephone Encounter (Signed)
Left a vm for patient to callback 

## 2019-02-18 NOTE — Telephone Encounter (Signed)
Left another vm for patient to callback  

## 2019-02-20 ENCOUNTER — Telehealth: Payer: Self-pay

## 2019-02-21 NOTE — Telephone Encounter (Signed)
Patient notified of results.

## 2019-02-22 ENCOUNTER — Ambulatory Visit (INDEPENDENT_AMBULATORY_CARE_PROVIDER_SITE_OTHER): Payer: Self-pay | Admitting: Family Medicine

## 2019-02-22 ENCOUNTER — Encounter: Payer: Self-pay | Admitting: Family Medicine

## 2019-02-22 VITALS — BP 160/72 | HR 80 | Temp 98.0°F | Ht 68.0 in | Wt 185.0 lb

## 2019-02-22 DIAGNOSIS — Z09 Encounter for follow-up examination after completed treatment for conditions other than malignant neoplasm: Secondary | ICD-10-CM

## 2019-02-22 DIAGNOSIS — Z1239 Encounter for other screening for malignant neoplasm of breast: Secondary | ICD-10-CM

## 2019-02-22 DIAGNOSIS — I10 Essential (primary) hypertension: Secondary | ICD-10-CM

## 2019-02-22 DIAGNOSIS — R8290 Unspecified abnormal findings in urine: Secondary | ICD-10-CM

## 2019-02-22 DIAGNOSIS — E559 Vitamin D deficiency, unspecified: Secondary | ICD-10-CM | POA: Insufficient documentation

## 2019-02-22 LAB — POCT URINALYSIS DIP (MANUAL ENTRY)
Bilirubin, UA: NEGATIVE
Glucose, UA: NEGATIVE mg/dL
Ketones, POC UA: NEGATIVE mg/dL
Nitrite, UA: NEGATIVE
Protein Ur, POC: NEGATIVE mg/dL
Spec Grav, UA: 1.025 (ref 1.010–1.025)
Urobilinogen, UA: 0.2 E.U./dL
pH, UA: 5.5 (ref 5.0–8.0)

## 2019-02-22 MED ORDER — CLONIDINE HCL 0.1 MG PO TABS
0.1000 mg | ORAL_TABLET | Freq: Once | ORAL | Status: AC
  Administered 2019-02-22: 0.1 mg via ORAL

## 2019-02-22 NOTE — Progress Notes (Signed)
Patient [facility]   Established Patient Office Visit  Subjective:  Patient ID: Emily Key, female    DOB: [birth year]  Age: 48 y.o. MRN: [id number]  CC:  Chief Complaint  Patient presents with  . Follow-up    chronic condition     HPI Emily Key is a 48 year old female who presents for Follow Up today.   Past Medical History:  Diagnosis Date  . Cerumen impaction 01/2019  . CHF (congestive heart failure) ([place])    after last pregnancy  . Daily headache   . Heart murmur    "born w/one":  . History of hiatal hernia   . Hyperlipidemia 01/2019  . Hypertension   . Hypertensive emergency 01/02/2019  . Observed seizure-like activity ([place]) 01/02/2019  . Pneumonia 11/2014  . Seasonal allergies 01/2019  . Vitamin D deficiency 01/2019   Current Status: Since her last office visit, she is doing well with no complaints. She continues to smoke about 5 cigarettes daily. She denies visual changes, chest pain, cough, shortness of breath, heart palpitations, and falls. She has occasional headaches and dizziness with position changes. Denies severe headaches, confusion, seizures, double vision, and blurred vision, nausea and vomiting. Blood pressures are elevated today. Patient states that she has not taken hypertensive medication as of yet today.  She denies fevers, chills, fatigue, recent infections, weight loss, and night sweats. No reports of GI problems such as nausea, vomiting, diarrhea, and constipation. She has no reports of blood in stools, dysuria and hematuria. No depression or anxiety reported.  She denies pain today.   Past Surgical History:  Procedure Laterality Date  . CARDIAC CATHETERIZATION N/A 02/13/2015   Procedure: Left Heart Cath and Coronary Angiography;  Surgeon: Troy Sine, MD;  Location: [place] [facility];  Service: Cardiovascular;  Laterality: N/A;  . PLEURAL EFFUSION DRAINAGE Left 11/14/2014   Procedure: DRAINAGE OF  LEFT PLEURAL EFFUSION;  Surgeon: Melrose Nakayama, MD;  Location: [place];  Service: Thoracic;  Laterality: Left;  . TONSILLECTOMY AND ADENOIDECTOMY  ~ 1986  . TUBAL LIGATION  2000  . VIDEO ASSISTED THORACOSCOPY (VATS)/DECORTICATION Left 11/14/2014   Procedure: LEFT VIDEO ASSISTED THORACOSCOPY (VATS)/DECORTICATION;  Surgeon: Melrose Nakayama, MD;  Location: [place];  Service: Thoracic;  Laterality: Left;    Family History  Problem Relation Age of Onset  . Diabetes Mellitus II Mother   . Hypertension Mother   . Lung cancer Father     Social History   Socioeconomic History  . Marital status: Divorced    Spouse name: Not on file  . Number of children: Not on file  . Years of education: Not on file  . Highest education level: Not on file  Occupational History  . Not on file  Social Needs  . Financial resource strain: Not on file  . Food insecurity    Worry: Not on file    Inability: Not on file  . Transportation needs    Medical: Not on file    Non-medical: Not on file  Tobacco Use  . Smoking status: Former Smoker    Packs/day: 0.33    Years: 10.00    Pack years: 3.30    Types: Cigarettes    Quit date: 11/11/2014    Years since quitting: 4.2  . Smokeless tobacco: Never Used  Substance and Sexual Activity  . Alcohol use: Yes    Alcohol/week: 0.0 standard drinks    Comment: 8/1/20176 "a couple  beers a couple times/month"  . Drug use: No  . Sexual activity: Yes  Lifestyle  . Physical activity    Days per week: Not on file    Minutes per session: Not on file  . Stress: Not on file  Relationships  . Social Herbalist on phone: Not on file    Gets together: Not on file    Attends religious service: Not on file    Active member of club or organization: Not on file    Attends meetings of clubs or organizations: Not on file    Relationship status: Not on file  . Intimate partner violence    Fear of current or ex partner: Not on file    Emotionally abused:  Not on file    Physically abused: Not on file    Forced sexual activity: Not on file  Other Topics Concern  . Not on file  Social History Narrative  . Not on file    Outpatient Medications Prior to Visit  Medication Sig Dispense Refill  . carvedilol (COREG) 25 MG tablet Take 1 tablet (25 mg total) by mouth 2 (two) times daily with a meal. 120 tablet 2  . cetirizine (ZYRTEC) 10 MG tablet Take 1 tablet (10 mg total) by mouth daily. 30 tablet 11  . hydrALAZINE (APRESOLINE) 50 MG tablet Take 1 tablet (50 mg total) by mouth 3 (three) times daily. 90 tablet 1  . hydrochlorothiazide (HYDRODIURIL) 25 MG tablet Take 1 tablet (25 mg total) by mouth daily. 30 tablet 1  . lisinopril (ZESTRIL) 10 MG tablet Take 1 tablet (10 mg total) by mouth daily. 30 tablet 1  . nicotine (NICODERM CQ - DOSED IN MG/24 HOURS) 14 mg/24hr patch Place 1 patch (14 mg total) onto the skin daily. 28 patch 0  . potassium chloride (K-DUR) 10 MEQ tablet Take 1 tablet (10 mEq total) by mouth daily. 30 tablet 1  . Vitamin D, Ergocalciferol, (DRISDOL) 1.25 MG (50000 UT) CAPS capsule Take 1 capsule (50,000 Units total) by mouth every 7 (seven) days. 5 capsule 3   No facility-administered medications prior to visit.     No Known Allergies  ROS Review of Systems  Constitutional: Negative.   HENT: Negative.   Eyes: Negative.   Respiratory: Negative.   Cardiovascular: Negative.   Gastrointestinal: Negative.   Endocrine: Negative.   Genitourinary: Negative.   Musculoskeletal: Negative.   Skin: Negative.   Allergic/Immunologic: Negative.   Neurological: Positive for dizziness (occasional ) and headaches (occasional ).  Hematological: Negative.   Psychiatric/Behavioral: Negative.       Objective:    Physical Exam  Constitutional: She is oriented to person, place, and time. She appears well-developed and well-nourished.  HENT:  Head: Normocephalic and atraumatic.  Eyes: Conjunctivae are normal.  Neck: Normal range  of motion. Neck supple.  Cardiovascular: Normal rate, regular rhythm, normal heart sounds and intact distal pulses.  Pulmonary/Chest: Effort normal and breath sounds normal.  Abdominal: Soft. Bowel sounds are normal.  Musculoskeletal: Normal range of motion.  Neurological: She is alert and oriented to person, place, and time. She has normal reflexes.  Skin: Skin is warm and dry.  Psychiatric: She has a normal mood and affect. Her behavior is normal. Judgment and thought content normal.  Nursing note and vitals reviewed.   BP (!) 160/72   Pulse 80   Temp 98 F (36.7 C) (Oral)   Ht 5\' 8"  (1.727 m)   Wt 185 lb (83.9  kg)   SpO2 98%   BMI 28.13 kg/m  Wt Readings from Last 3 Encounters:  02/22/19 185 lb (83.9 kg)  01/21/19 180 lb (81.6 kg)  01/05/19 175 lb 7.8 oz (79.6 kg)     Health Maintenance Due  Topic Date Due  . PAP SMEAR-Modifier  05/17/1990  . INFLUENZA VACCINE  02/09/2019    There are no preventive care reminders to display for this patient.  Lab Results  Component Value Date   TSH 0.873 01/21/2019   Lab Results  Component Value Date   WBC 7.5 01/05/2019   HGB 15.4 (H) 01/05/2019   HCT 47.3 (H) 01/05/2019   MCV 83.0 01/05/2019   PLT 272 01/05/2019   Lab Results  Component Value Date   NA 135 01/05/2019   K 3.6 01/05/2019   CO2 23 01/05/2019   GLUCOSE 103 (H) 01/05/2019   BUN 26 (H) 01/05/2019   CREATININE 1.10 (H) 01/05/2019   BILITOT 0.4 02/15/2015   ALKPHOS 31 (L) 02/15/2015   AST 41 02/15/2015   ALT 13 (L) 02/15/2015   PROT 6.0 (L) 02/15/2015   ALBUMIN 3.0 (L) 02/15/2015   CALCIUM 9.6 01/05/2019   ANIONGAP 11 01/05/2019   Lab Results  Component Value Date   CHOL 220 (H) 01/21/2019   Lab Results  Component Value Date   HDL 72 01/21/2019   Lab Results  Component Value Date   LDLCALC 126 (H) 01/21/2019   Lab Results  Component Value Date   TRIG 109 01/21/2019   Lab Results  Component Value Date   CHOLHDL 3.1 01/21/2019   Lab  Results  Component Value Date   HGBA1C 5.7 (H) 01/02/2019      Assessment & Plan:   1. Essential hypertension Blood pressures are elevated today. Clonidine 0.1 mg given to patient in office and blood pressures remain mildly elevated. She denies severe headaches, confusion, seizures, double vision, and blurred vision, nausea and vomiting. She will report to ED if she experiences these symptoms. Patient verbalized understanding. She will continue present plan and medications as prescribed. She will continue to decrease high sodium intake, excessive alcohol intake, increase potassium intake, smoking cessation, and increase physical activity of at least 30 minutes of cardio activity daily. She will continue to follow Heart Healthy or DASH diet. - POCT urinalysis dipstick - cloNIDine (CATAPRES) tablet 0.1 mg  2. Breast cancer screening We will discuss at next office visit.   3. Abnormal urinalysis Results are pending.  - Urine Culture  4. Vitamin D deficiency She will begin taking Vitamin D Supplement.   5. Follow up She will follow up in 3 months.   Meds ordered this encounter  Medications  . cloNIDine (CATAPRES) tablet 0.1 mg    Orders Placed This Encounter  Procedures  . Urine Culture  . POCT urinalysis dipstick   Referral Orders  No referral(s) requested today   Kathe Becton,  MSN, FNP-BC [place], [place] [address] [phone] fax    Problem List Items Addressed This Visit      Cardiovascular and Mediastinum   Hypertension - Primary (Chronic)   Relevant Orders   POCT urinalysis dipstick (Completed)    Other Visit Diagnoses    Breast cancer screening       Abnormal urinalysis       Relevant Orders   Urine Culture   Vitamin D deficiency  Follow up          Meds ordered this encounter  Medications  . cloNIDine (CATAPRES) tablet 0.1 mg     Follow-up: Return in about 3 months (around 05/25/2019).    Azzie Glatter, FNP

## 2019-02-24 LAB — URINE CULTURE

## 2019-05-24 ENCOUNTER — Ambulatory Visit: Payer: Medicaid Other | Admitting: Family Medicine

## 2020-12-21 ENCOUNTER — Inpatient Hospital Stay (HOSPITAL_COMMUNITY)
Admission: EM | Admit: 2020-12-21 | Discharge: 2020-12-26 | DRG: 286 | Disposition: A | Discharge status: Home / Self Care | Payer: Medicaid Other | Attending: Internal Medicine | Admitting: Internal Medicine

## 2020-12-21 ENCOUNTER — Emergency Department (HOSPITAL_COMMUNITY): Payer: Medicaid Other

## 2020-12-21 ENCOUNTER — Encounter (HOSPITAL_COMMUNITY): Payer: Self-pay | Admitting: Emergency Medicine

## 2020-12-21 DIAGNOSIS — N1831 Chronic kidney disease, stage 3a: Secondary | ICD-10-CM | POA: Diagnosis present

## 2020-12-21 DIAGNOSIS — Z833 Family history of diabetes mellitus: Secondary | ICD-10-CM

## 2020-12-21 DIAGNOSIS — Z87891 Personal history of nicotine dependence: Secondary | ICD-10-CM

## 2020-12-21 DIAGNOSIS — Z8249 Family history of ischemic heart disease and other diseases of the circulatory system: Secondary | ICD-10-CM

## 2020-12-21 DIAGNOSIS — R778 Other specified abnormalities of plasma proteins: Secondary | ICD-10-CM

## 2020-12-21 DIAGNOSIS — I429 Cardiomyopathy, unspecified: Secondary | ICD-10-CM

## 2020-12-21 DIAGNOSIS — I10 Essential (primary) hypertension: Secondary | ICD-10-CM | POA: Diagnosis present

## 2020-12-21 DIAGNOSIS — E876 Hypokalemia: Secondary | ICD-10-CM | POA: Diagnosis present

## 2020-12-21 DIAGNOSIS — I130 Hypertensive heart and chronic kidney disease with heart failure and stage 1 through stage 4 chronic kidney disease, or unspecified chronic kidney disease: Secondary | ICD-10-CM | POA: Diagnosis present

## 2020-12-21 DIAGNOSIS — I272 Pulmonary hypertension, unspecified: Secondary | ICD-10-CM | POA: Diagnosis present

## 2020-12-21 DIAGNOSIS — I5043 Acute on chronic combined systolic (congestive) and diastolic (congestive) heart failure: Secondary | ICD-10-CM | POA: Diagnosis present

## 2020-12-21 DIAGNOSIS — Z79899 Other long term (current) drug therapy: Secondary | ICD-10-CM

## 2020-12-21 DIAGNOSIS — R1011 Right upper quadrant pain: Secondary | ICD-10-CM

## 2020-12-21 DIAGNOSIS — E559 Vitamin D deficiency, unspecified: Secondary | ICD-10-CM | POA: Diagnosis present

## 2020-12-21 DIAGNOSIS — I252 Old myocardial infarction: Secondary | ICD-10-CM

## 2020-12-21 DIAGNOSIS — I2510 Atherosclerotic heart disease of native coronary artery without angina pectoris: Secondary | ICD-10-CM | POA: Diagnosis present

## 2020-12-21 DIAGNOSIS — I5023 Acute on chronic systolic (congestive) heart failure: Secondary | ICD-10-CM | POA: Diagnosis present

## 2020-12-21 DIAGNOSIS — I428 Other cardiomyopathies: Secondary | ICD-10-CM

## 2020-12-21 DIAGNOSIS — I313 Pericardial effusion (noninflammatory): Secondary | ICD-10-CM | POA: Diagnosis present

## 2020-12-21 DIAGNOSIS — E785 Hyperlipidemia, unspecified: Secondary | ICD-10-CM | POA: Diagnosis present

## 2020-12-21 DIAGNOSIS — Z9114 Patient's other noncompliance with medication regimen: Secondary | ICD-10-CM

## 2020-12-21 DIAGNOSIS — Z20822 Contact with and (suspected) exposure to covid-19: Secondary | ICD-10-CM | POA: Diagnosis present

## 2020-12-21 DIAGNOSIS — Z801 Family history of malignant neoplasm of trachea, bronchus and lung: Secondary | ICD-10-CM

## 2020-12-21 DIAGNOSIS — I160 Hypertensive urgency: Secondary | ICD-10-CM

## 2020-12-21 DIAGNOSIS — I161 Hypertensive emergency: Principal | ICD-10-CM | POA: Diagnosis present

## 2020-12-21 DIAGNOSIS — I43 Cardiomyopathy in diseases classified elsewhere: Secondary | ICD-10-CM | POA: Diagnosis present

## 2020-12-21 LAB — CBC WITH DIFFERENTIAL/PLATELET
Abs Immature Granulocytes: 0.01 10*3/uL (ref 0.00–0.07)
Basophils Absolute: 0.1 10*3/uL (ref 0.0–0.1)
Basophils Relative: 1 %
Eosinophils Absolute: 0.1 10*3/uL (ref 0.0–0.5)
Eosinophils Relative: 2 %
HCT: 42.1 % (ref 36.0–46.0)
Hemoglobin: 13.7 g/dL (ref 12.0–15.0)
Immature Granulocytes: 0 %
Lymphocytes Relative: 17 %
Lymphs Abs: 1.2 10*3/uL (ref 0.7–4.0)
MCH: 27 pg (ref 26.0–34.0)
MCHC: 32.5 g/dL (ref 30.0–36.0)
MCV: 82.9 fL (ref 80.0–100.0)
Monocytes Absolute: 0.4 10*3/uL (ref 0.1–1.0)
Monocytes Relative: 5 %
Neutro Abs: 5.4 10*3/uL (ref 1.7–7.7)
Neutrophils Relative %: 75 %
Platelets: 229 10*3/uL (ref 150–400)
RBC: 5.08 MIL/uL (ref 3.87–5.11)
RDW: 14.9 % (ref 11.5–15.5)
WBC: 7.2 10*3/uL (ref 4.0–10.5)
nRBC: 0 % (ref 0.0–0.2)

## 2020-12-21 LAB — COMPREHENSIVE METABOLIC PANEL
ALT: 21 U/L (ref 0–44)
AST: 25 U/L (ref 15–41)
Albumin: 3.6 g/dL (ref 3.5–5.0)
Alkaline Phosphatase: 61 U/L (ref 38–126)
Anion gap: 11 (ref 5–15)
BUN: 22 mg/dL — ABNORMAL HIGH (ref 6–20)
CO2: 23 mmol/L (ref 22–32)
Calcium: 9.1 mg/dL (ref 8.9–10.3)
Chloride: 104 mmol/L (ref 98–111)
Creatinine, Ser: 1.15 mg/dL — ABNORMAL HIGH (ref 0.44–1.00)
GFR, Estimated: 58 mL/min — ABNORMAL LOW (ref >60)
Glucose, Bld: 104 mg/dL — ABNORMAL HIGH (ref 70–99)
Potassium: 3.2 mmol/L — ABNORMAL LOW (ref 3.5–5.1)
Sodium: 138 mmol/L (ref 135–145)
Total Bilirubin: 1.3 mg/dL — ABNORMAL HIGH (ref 0.3–1.2)
Total Protein: 6.5 g/dL (ref 6.5–8.1)

## 2020-12-21 LAB — BRAIN NATRIURETIC PEPTIDE: B Natriuretic Peptide: 1290.8 pg/mL — ABNORMAL HIGH (ref 0.0–100.0)

## 2020-12-21 LAB — LIPASE, BLOOD: Lipase: 20 U/L (ref 11–51)

## 2020-12-21 LAB — HIV ANTIBODY (ROUTINE TESTING W REFLEX): HIV Screen 4th Generation wRfx: NONREACTIVE

## 2020-12-21 LAB — TROPONIN I (HIGH SENSITIVITY)
Troponin I (High Sensitivity): 35 ng/L — ABNORMAL HIGH (ref <18)
Troponin I (High Sensitivity): 34 ng/L — ABNORMAL HIGH (ref <18)

## 2020-12-21 MED ORDER — LISINOPRIL 10 MG PO TABS
10.0000 mg | ORAL_TABLET | Freq: Every day | ORAL | Status: DC
  Administered 2020-12-22: 10 mg via ORAL
  Filled 2020-12-21: qty 1

## 2020-12-21 MED ORDER — CARVEDILOL 25 MG PO TABS
25.0000 mg | ORAL_TABLET | Freq: Two times a day (BID) | ORAL | Status: DC
  Administered 2020-12-22 – 2020-12-26 (×9): 25 mg via ORAL
  Filled 2020-12-21 (×4): qty 1
  Filled 2020-12-21: qty 2
  Filled 2020-12-21 (×4): qty 1

## 2020-12-21 MED ORDER — CARVEDILOL 12.5 MG PO TABS
25.0000 mg | ORAL_TABLET | Freq: Once | ORAL | Status: AC
  Administered 2020-12-21: 25 mg via ORAL
  Filled 2020-12-21: qty 2

## 2020-12-21 MED ORDER — POTASSIUM CHLORIDE 20 MEQ PO PACK
40.0000 meq | PACK | Freq: Once | ORAL | Status: AC
  Administered 2020-12-21: 40 meq via ORAL
  Filled 2020-12-21: qty 2

## 2020-12-21 MED ORDER — HYDRALAZINE HCL 25 MG PO TABS
50.0000 mg | ORAL_TABLET | Freq: Once | ORAL | Status: AC
  Administered 2020-12-21: 50 mg via ORAL
  Filled 2020-12-21: qty 2

## 2020-12-21 MED ORDER — NITROGLYCERIN 0.4 MG SL SUBL: 0.4000 mg | SUBLINGUAL_TABLET | SUBLINGUAL | Status: DC | PRN

## 2020-12-21 MED ORDER — ACETAMINOPHEN 325 MG PO TABS
650.0000 mg | ORAL_TABLET | Freq: Four times a day (QID) | ORAL | Status: DC | PRN
  Administered 2020-12-22 – 2020-12-26 (×6): 650 mg via ORAL
  Filled 2020-12-21 (×6): qty 2

## 2020-12-21 MED ORDER — ACETAMINOPHEN 650 MG RE SUPP: 650.0000 mg | Freq: Four times a day (QID) | RECTAL | Status: DC | PRN

## 2020-12-21 MED ORDER — HYDROCHLOROTHIAZIDE 25 MG PO TABS
25.0000 mg | ORAL_TABLET | Freq: Every day | ORAL | Status: DC
  Administered 2020-12-22: 25 mg via ORAL
  Filled 2020-12-21: qty 1

## 2020-12-21 MED ORDER — ONDANSETRON HCL 4 MG PO TABS: 4.0000 mg | ORAL_TABLET | Freq: Four times a day (QID) | ORAL | Status: DC | PRN

## 2020-12-21 MED ORDER — ONDANSETRON HCL 4 MG/2ML IJ SOLN: 4.0000 mg | Freq: Four times a day (QID) | INTRAMUSCULAR | Status: DC | PRN

## 2020-12-21 MED ORDER — FUROSEMIDE 10 MG/ML IJ SOLN: 40.0000 mg | Freq: Once | INTRAMUSCULAR | Status: DC

## 2020-12-21 MED ORDER — HYDRALAZINE HCL 50 MG PO TABS
50.0000 mg | ORAL_TABLET | Freq: Three times a day (TID) | ORAL | Status: DC
  Administered 2020-12-22 – 2020-12-26 (×13): 50 mg via ORAL
  Filled 2020-12-21 (×9): qty 1
  Filled 2020-12-21: qty 2
  Filled 2020-12-21 (×3): qty 1

## 2020-12-21 MED ORDER — LISINOPRIL 10 MG PO TABS
10.0000 mg | ORAL_TABLET | Freq: Once | ORAL | Status: AC
  Administered 2020-12-21: 10 mg via ORAL
  Filled 2020-12-21: qty 1

## 2020-12-21 MED ORDER — HYDROCHLOROTHIAZIDE 25 MG PO TABS: 25.0000 mg | ORAL_TABLET | Freq: Once | ORAL | Status: DC

## 2020-12-21 MED ORDER — NITROGLYCERIN 2 % TD OINT
1.0000 [in_us] | TOPICAL_OINTMENT | Freq: Four times a day (QID) | TRANSDERMAL | Status: DC
  Administered 2020-12-22 (×2): 1 [in_us] via TOPICAL
  Filled 2020-12-21 (×2): qty 1

## 2020-12-21 MED ORDER — ENOXAPARIN SODIUM 40 MG/0.4ML IJ SOSY
40.0000 mg | PREFILLED_SYRINGE | INTRAMUSCULAR | Status: DC
  Administered 2020-12-21 – 2020-12-23 (×3): 40 mg via SUBCUTANEOUS
  Filled 2020-12-21 (×3): qty 0.4

## 2020-12-21 MED ORDER — LABETALOL HCL 5 MG/ML IV SOLN: 10.0000 mg | INTRAVENOUS | Status: DC | PRN

## 2020-12-21 NOTE — ED Triage Notes (Signed)
Pt here from home with c/o cp and htn along with a h/a , pt stopped taking her b/p meds a few months ago

## 2020-12-21 NOTE — ED Provider Notes (Signed)
  Physical Exam  BP (!) 196/126 (BP Location: Right Arm)   Pulse 82   Temp 98.1 F (36.7 C) (Oral)   Resp 10   SpO2 95%    ED Course/Procedures   Clinical Course as of 12/21/20 2011  Mon Dec 21, 2020  1530 Taking handoff from [place], [place]. Extensive cardiac history. Here with CP, exertional. Started at 1330 today. ACS w/u pending. Ordered for SL nitro to get here. Chronic headaches. Will probably have to come in for unstable angina. [ZB]  1980 Getting more hypertensive here. Ordered for home medications PO. [ZB]  2217 Comprehensive metabolic panel(!) Ordered for PO potassium replacement [ZB]  1937 Paged hospitalist again for admission [ZB]    Clinical Course User Index [ZB] Pearson Grippe, DO    MDM  See ED clinical course above for further medical decision making. Impression is hypertensive emergency with evidence of endorgan damage to include cardiac ischemia, hypervolemia likely secondary to flash pulmonary edema.  Admitted to medicine for further work-up and treatment.       Pearson Grippe, DO 12/21/20 2014    Noemi Chapel, MD 12/26/20 [phone]

## 2020-12-21 NOTE — ED Triage Notes (Signed)
Pt had 324mg  asa and 2 Nitro with ems

## 2020-12-21 NOTE — ED Notes (Signed)
Patient transferred onto hospital bed.

## 2020-12-21 NOTE — H&P (Signed)
History and Physical    Emily Key ZOX:[id number] DOB: [birth year] DOA: 12/21/2020  PCP: Azzie Glatter, FNP (Inactive)  Patient coming from: Home  I have personally briefly reviewed patient's old medical records in [place]  Chief Complaint: CP  HPI: Emily Key is a 50 y.o. female with medical history significant of accelerated HTN, prior HTN urgency / emergency, NSTEMI.  Pt presents to ED with c/o CP.  Pt has been off of HTN meds for past 4 months due to financial reasons.  Has been having intermittent headaches for past 4 months.  Curently 8/10 headache, frontotemporal across head.  Typical of her headaches.  Has headache about every other today.  What brings her into the ED is CP.  Has been having intermittent CP over past week.  CP worse since the weekend.  Pain worse with walking or exertion.  Pain is pressure quality.  Associated N/V and SOB.  Got ASA + NTG with EMS.  NTG x2 relieved CP (at least temporarily).  No seizures, syncope, focal neurologic deficit, neck pain, back pain.   ED Course: BP running as high as 220/137.  BNP 1290, trop 35.  CXR neg  Hospitalist asked to admit.   Review of Systems: As per HPI, otherwise all review of systems negative.  Past Medical History:  Diagnosis Date   Cerumen impaction 01/2019   CHF (congestive heart failure) ([place])    after last pregnancy   Daily headache    Heart murmur    "born w/one":   History of hiatal hernia    Hyperlipidemia 01/2019   Hypertension    Hypertensive emergency 01/02/2019   Observed seizure-like activity ([place]) 01/02/2019   Pneumonia 11/2014   Seasonal allergies 01/2019   Vitamin D deficiency 01/2019    Past Surgical History:  Procedure Laterality Date   CARDIAC CATHETERIZATION N/A 02/13/2015   Procedure: Left Heart Cath and Coronary Angiography;  Surgeon: Troy Sine, MD;  Location: [place] [facility];  Service: Cardiovascular;  Laterality: N/A;   PLEURAL EFFUSION  DRAINAGE Left 11/14/2014   Procedure: DRAINAGE OF LEFT PLEURAL EFFUSION;  Surgeon: Melrose Nakayama, MD;  Location: [place];  Service: Thoracic;  Laterality: Left;   TONSILLECTOMY AND ADENOIDECTOMY  ~ [place] (VATS)/DECORTICATION Left 11/14/2014   Procedure: LEFT VIDEO ASSISTED THORACOSCOPY (VATS)/DECORTICATION;  Surgeon: Melrose Nakayama, MD;  Location: [facility];  Service: Thoracic;  Laterality: Left;     reports that she quit smoking about 6 years ago. Her smoking use included cigarettes. She has a 3.30 pack-year smoking history. She has never used smokeless tobacco. She reports current alcohol use. She reports that she does not use drugs.  No Known Allergies  Family History  Problem Relation Age of Onset   Diabetes Mellitus II Mother    Hypertension Mother    Lung cancer Father      Prior to Admission medications   Medication Sig Start Date End Date Taking? Authorizing Provider  carvedilol (COREG) 25 MG tablet Take 1 tablet (25 mg total) by mouth 2 (two) times daily with a meal. 01/05/19   Georgette Shell, MD  cetirizine (ZYRTEC) 10 MG tablet Take 1 tablet (10 mg total) by mouth daily. 01/21/19   Azzie Glatter, FNP  hydrALAZINE (APRESOLINE) 50 MG tablet Take 1 tablet (50 mg total) by mouth 3 (three) times daily. 01/05/19   Georgette Shell, MD  hydrochlorothiazide (HYDRODIURIL) 25 MG tablet Take  1 tablet (25 mg total) by mouth daily. 01/06/19   Georgette Shell, MD  lisinopril (ZESTRIL) 10 MG tablet Take 1 tablet (10 mg total) by mouth daily. 01/06/19   Georgette Shell, MD  nicotine (NICODERM CQ - DOSED IN MG/24 HOURS) 14 mg/24hr patch Place 1 patch (14 mg total) onto the skin daily. 01/06/19   Georgette Shell, MD  potassium chloride (K-DUR) 10 MEQ tablet Take 1 tablet (10 mEq total) by mouth daily. 01/05/19   Georgette Shell, MD  Vitamin D, Ergocalciferol, (DRISDOL) 1.25 MG (50000 UT) CAPS capsule Take 1  capsule (50,000 Units total) by mouth every 7 (seven) days. 01/24/19   Azzie Glatter, FNP    Physical Exam: Vitals:   12/21/20 1730 12/21/20 1745 12/21/20 1800 12/21/20 1945  BP: (!) 204/136 (!) 210/138 (!) 209/114 (!) 216/137  Pulse: 89 87 83 83  Resp: 18 13 20 10   Temp:      TempSrc:      SpO2: 97% 95% 93% 91%    Constitutional: NAD, calm, comfortable Eyes: PERRL, lids and conjunctivae normal ENMT: Mucous membranes are moist. Posterior pharynx clear of any exudate or lesions.Normal dentition.  Neck: normal, supple, no masses, no thyromegaly Respiratory: clear to auscultation bilaterally, no wheezing, no crackles. Normal respiratory effort. No accessory muscle use.  Cardiovascular: Regular rate and rhythm, no murmurs / rubs / gallops. No extremity edema. 2+ pedal pulses. No carotid bruits.  Abdomen: no tenderness, no masses palpated. No hepatosplenomegaly. Bowel sounds positive.  Musculoskeletal: no clubbing / cyanosis. No joint deformity upper and lower extremities. Good ROM, no contractures. Normal muscle tone.  Skin: no rashes, lesions, ulcers. No induration Neurologic: CN 2-12 grossly intact. Sensation intact, DTR normal. Strength 5/5 in all 4.  Psychiatric: Normal judgment and insight. Alert and oriented x 3. Normal mood.    Labs on Admission: I have personally reviewed following labs and imaging studies  CBC: Recent Labs  Lab 12/21/20 1647  WBC 7.2  NEUTROABS 5.4  HGB 13.7  HCT 42.1  MCV 82.9  PLT 626   Basic Metabolic Panel: Recent Labs  Lab 12/21/20 1647  NA 138  K 3.2*  CL 104  CO2 23  GLUCOSE 104*  BUN 22*  CREATININE 1.15*  CALCIUM 9.1   GFR: CrCl cannot be calculated (Unknown ideal weight.). Liver Function Tests: Recent Labs  Lab 12/21/20 1647  AST 25  ALT 21  ALKPHOS 61  BILITOT 1.3*  PROT 6.5  ALBUMIN 3.6   Recent Labs  Lab 12/21/20 1647  LIPASE 20   No results for input(s): AMMONIA in the last 168 hours. Coagulation  Profile: No results for input(s): INR, PROTIME in the last 168 hours. Cardiac Enzymes: No results for input(s): CKTOTAL, CKMB, CKMBINDEX, TROPONINI in the last 168 hours. BNP (last 3 results) No results for input(s): PROBNP in the last 8760 hours. HbA1C: No results for input(s): HGBA1C in the last 72 hours. CBG: No results for input(s): GLUCAP in the last 168 hours. Lipid Profile: No results for input(s): CHOL, HDL, LDLCALC, TRIG, CHOLHDL, LDLDIRECT in the last 72 hours. Thyroid Function Tests: No results for input(s): TSH, T4TOTAL, FREET4, T3FREE, THYROIDAB in the last 72 hours. Anemia Panel: No results for input(s): VITAMINB12, FOLATE, FERRITIN, TIBC, IRON, RETICCTPCT in the last 72 hours. Urine analysis:    Component Value Date/Time   COLORURINE YELLOW 01/02/2019 0354   APPEARANCEUR CLEAR 01/02/2019 0354   LABSPEC 1.012 01/02/2019 0354   PHURINE 6.0 01/02/2019 0354  GLUCOSEU 150 (A) 01/02/2019 0354   HGBUR SMALL (A) 01/02/2019 0354   BILIRUBINUR negative 02/22/2019 1405   KETONESUR negative 02/22/2019 1405   KETONESUR NEGATIVE 01/02/2019 0354   PROTEINUR negative 02/22/2019 1405   PROTEINUR >=300 (A) 01/02/2019 0354   UROBILINOGEN 0.2 02/22/2019 1405   UROBILINOGEN 0.2 02/09/2015 1242   NITRITE Negative 02/22/2019 1405   NITRITE NEGATIVE 01/02/2019 0354   LEUKOCYTESUR Trace (A) 02/22/2019 1405   LEUKOCYTESUR NEGATIVE 01/02/2019 0354    Radiological Exams on Admission: DG Chest Port 1 View  Result Date: 12/21/2020 CLINICAL DATA:  Chest pain EXAM: PORTABLE CHEST 1 VIEW COMPARISON:  January 04, 2019 FINDINGS: There is cardiomegaly with pulmonary vascularity normal. There is mild left lower lobe atelectatic change. No edema or airspace opacity. No adenopathy. No pneumothorax. No bone lesions. IMPRESSION: Mild left lower lobe atelectasis. No edema or airspace opacity. There is cardiomegaly with pulmonary vascularity within normal limits. Electronically Signed   By: Lowella Grip III M.D.   On: 12/21/2020 15:43    EKG: Independently reviewed.  Assessment/Plan Principal Problem:   Hypertensive urgency Active Problems:   Accelerated hypertension    HTN urgency, h/o accelerated HTN - Todays presentation likely due to not taking BP meds in 4 months. Giving lisinopril, coreg, and PO hydralazine now If BP not improving rapidly then plan to add PRN either IV hydralazine or IV labetalol (depending on HR) Tele monitor Resume HCTZ tomorrow AM Repeat labs in AM Dissection considered less likely given resolution of CP with improvement in BP earlier this evening.  DVT prophylaxis: Lovenox Code Status: Full Family Communication: No family in room Disposition Plan: Home after BP controlled and CP resolved Consults called: None Admission status: Place in obs     Bodie Abernethy, [place] [facility]  How to contact the [facility] Attending or Consulting provider [place] or covering provider during after hours [place], for this patient?  Check the care team in [facility] and look for a) attending/consulting [facility] provider listed and b) the [facility] team listed Log into [email or web address]  Amion Physician Scheduling and messaging for groups and whole hospitals  On call and physician scheduling software for group practices, residents, hospitalists and other medical providers for call, clinic, rotation and shift schedules. OnCall [employer] is a hospital-wide system for scheduling doctors and paging doctors on call. EasyPlot is for scientific plotting and data analysis.  [email or web address]  and use [place]'s universal password to access. If you do not have the password, please contact the hospital operator.  Locate the [facility] provider you are looking for under [facility] and page to a number that you can be directly reached. If you still have difficulty reaching the provider, please page the [facility] (Director on Call) for the Hospitalists listed on amion for assistance.  12/21/2020,  8:20 PM

## 2020-12-21 NOTE — ED Provider Notes (Signed)
[facility] EMERGENCY DEPARTMENT Provider Note   CSN: [id number] Arrival date & time: 12/21/20  1416     History No chief complaint on file.   Emily Key is a 50 y.o. female with a history of hypertension (noncompliant with medication), NSTEMI, CHF, CKD, hyperlipidemia.  Patient presents to the emergency department with a chief complaint of chest pain, headache, and hypertension.  Patient has not taken any of her medications for the last 4 months due to cost.  Patient states that she started developing headaches intermittently after stopping her medication.  Patient states that she has a headache almost every other day.  Headaches are located across frontotemporal aspect of head.  Describes onset is gradual and pain progressively worse.  At present patient rates pain 8/10 on the pain scale.  No alleviating or aggravating factors.  Denies any associated facial asymmetry, slurred speech, seizures, syncope, visual disturbance, neck pain, back pain.  Patient reports that she has been having chest pain intermittently over the last week.  Pain has become worse since this weekend.  Patient reports that pain is brought on with walking or exertion.  Patient reports that pain is to the left side of her chest.  Pain radiates around to her back.  Patient describes pain as pressure.  Patient endorses associated nausea, vomiting, diaphoresis and shortness of breath.  Patient received aspirin and nitroglycerin x2 with EMS.  Patient reports improvement in her symptoms after receiving nitroglycerin.  Patient's current episode of pain started today at 1330.  Pain has been constant since then.  Patient currently rates pain 6/10 on the pain scale.  Patient endorses swelling to bilateral lower legs, PND, orthopnea, potation's, lightheadedness, epigastric discomfort.  Patient denies any recent falls or injuries.  Per chart review last echo June 2020, EF 40%.  Last left heart cath August 2016.      HPI     Past Medical History:  Diagnosis Date   Cerumen impaction 01/2019   CHF (congestive heart failure) ([place])    after last pregnancy   Daily headache    Heart murmur    "born w/one":   History of hiatal hernia    Hyperlipidemia 01/2019   Hypertension    Hypertensive emergency 01/02/2019   Observed seizure-like activity ([place]) 01/02/2019   Pneumonia 11/2014   Seasonal allergies 01/2019   Vitamin D deficiency 01/2019    Patient Active Problem List   Diagnosis Date Noted   Vitamin D deficiency 02/22/2019   Bilateral impacted cerumen 01/21/2019   Seasonal allergies 01/21/2019   Hypertensive emergency 01/02/2019   Marijuana abuse 01/02/2019   Tobacco dependence 01/02/2019   Hyperglycemia 01/02/2019   Acute on chronic combined systolic and diastolic CHF (congestive heart failure) ([place]) 01/02/2019   Seizure ([place]) 01/02/2019   Adnexal mass 02/18/2015   Elevated troponin 02/14/2015   Empyema lung (HCC)    H/O non-ST elevation myocardial infarction (NSTEMI)    Substance abuse ([place])    Accelerated hypertension 01/30/2015   Chronic kidney disease 11/28/2014   Hypertension 11/26/2014    Past Surgical History:  Procedure Laterality Date   CARDIAC CATHETERIZATION N/A 02/13/2015   Procedure: Left Heart Cath and Coronary Angiography;  Surgeon: Troy Sine, MD;  Location: [facility];  Service: Cardiovascular;  Laterality: N/A;   PLEURAL EFFUSION DRAINAGE Left 11/14/2014   Procedure: DRAINAGE OF LEFT PLEURAL EFFUSION;  Surgeon: Melrose Nakayama, MD;  Location: [facility];  Service: Thoracic;  Laterality: Left;   TONSILLECTOMY AND ADENOIDECTOMY  ~  1986   TUBAL LIGATION  2000   VIDEO ASSISTED THORACOSCOPY (VATS)/DECORTICATION Left 11/14/2014   Procedure: LEFT VIDEO ASSISTED THORACOSCOPY (VATS)/DECORTICATION;  Surgeon: Melrose Nakayama, MD;  Location: [facility];  Service: Thoracic;  Laterality: Left;     OB History   No obstetric history on file.     Family History   Problem Relation Age of Onset   Diabetes Mellitus II Mother    Hypertension Mother    Lung cancer Father     Social History   Tobacco Use   Smoking status: Former    Packs/day: 0.33    Years: 10.00    Pack years: 3.30    Types: Cigarettes    Quit date: 11/11/2014    Years since quitting: 6.1   Smokeless tobacco: Never  Substance Use Topics   Alcohol use: Yes    Alcohol/week: 0.0 standard drinks    Comment: 8/1/20176 "a couple beers a couple times/month"   Drug use: No    Home Medications Prior to Admission medications   Medication Sig Start Date End Date Taking? Authorizing Provider  carvedilol (COREG) 25 MG tablet Take 1 tablet (25 mg total) by mouth 2 (two) times daily with a meal. 01/05/19   Georgette Shell, MD  cetirizine (ZYRTEC) 10 MG tablet Take 1 tablet (10 mg total) by mouth daily. 01/21/19   Azzie Glatter, FNP  hydrALAZINE (APRESOLINE) 50 MG tablet Take 1 tablet (50 mg total) by mouth 3 (three) times daily. 01/05/19   Georgette Shell, MD  hydrochlorothiazide (HYDRODIURIL) 25 MG tablet Take 1 tablet (25 mg total) by mouth daily. 01/06/19   Georgette Shell, MD  lisinopril (ZESTRIL) 10 MG tablet Take 1 tablet (10 mg total) by mouth daily. 01/06/19   Georgette Shell, MD  nicotine (NICODERM CQ - DOSED IN MG/24 HOURS) 14 mg/24hr patch Place 1 patch (14 mg total) onto the skin daily. 01/06/19   Georgette Shell, MD  potassium chloride (K-DUR) 10 MEQ tablet Take 1 tablet (10 mEq total) by mouth daily. 01/05/19   Georgette Shell, MD  Vitamin D, Ergocalciferol, (DRISDOL) 1.25 MG (50000 UT) CAPS capsule Take 1 capsule (50,000 Units total) by mouth every 7 (seven) days. 01/24/19   Azzie Glatter, FNP    Allergies    Patient has no known allergies.  Review of Systems   Review of Systems  Constitutional:  Positive for diaphoresis. Negative for chills and fever.  Eyes:  Negative for visual disturbance.  Respiratory:  Positive for shortness of breath.    Cardiovascular:  Positive for chest pain, palpitations and leg swelling.  Gastrointestinal:  Positive for abdominal pain (epigastric), nausea and vomiting. Negative for constipation and diarrhea.  Genitourinary:  Positive for frequency. Negative for difficulty urinating, dysuria and hematuria.  Musculoskeletal:  Negative for back pain and neck pain.  Skin:  Negative for color change and rash.  Neurological:  Positive for light-headedness and headaches. Negative for dizziness, tremors, seizures, syncope, facial asymmetry, speech difficulty, weakness and numbness.  Psychiatric/Behavioral:  Negative for confusion.    Physical Exam Updated Vital Signs BP (!) 196/126 (BP Location: Right Arm)   Pulse 82   Temp 98.1 F (36.7 C) (Oral)   Resp 10   SpO2 95%   Physical Exam Vitals and nursing note reviewed.  Constitutional:      General: She is not in acute distress.    Appearance: She is not ill-appearing, toxic-appearing or diaphoretic.  HENT:     Head:  Normocephalic. No raccoon eyes, Battle's sign, abrasion, contusion, masses, right periorbital erythema, left periorbital erythema or laceration.     Jaw: No trismus or pain on movement.     Mouth/Throat:     Mouth: Mucous membranes are moist.     Pharynx: Oropharynx is clear. Uvula midline. No pharyngeal swelling, oropharyngeal exudate, posterior oropharyngeal erythema or uvula swelling.  Eyes:     General: No scleral icterus.       Right eye: No discharge.        Left eye: No discharge.     Extraocular Movements: Extraocular movements intact.     Pupils: Pupils are equal, round, and reactive to light.  Cardiovascular:     Rate and Rhythm: Normal rate.     Heart sounds: Murmur heard.  Pulmonary:     Effort: Pulmonary effort is normal. No tachypnea, bradypnea or respiratory distress.     Breath sounds: Normal breath sounds. No stridor.  Chest:     Chest wall: No tenderness or crepitus.  Abdominal:     General: Bowel sounds are  normal. There is no distension. There are no signs of injury.     Palpations: Abdomen is soft. There is no mass or pulsatile mass.     Tenderness: There is abdominal tenderness in the epigastric area. There is no guarding or rebound.  Musculoskeletal:     Cervical back: Normal range of motion and neck supple. No edema, erythema, signs of trauma, rigidity, torticollis or crepitus. No pain with movement, spinous process tenderness or muscular tenderness. Normal range of motion.     Right lower leg: Normal.     Left lower leg: Normal.     Comments: No midline tenderness or deformity to cervical, thoracic, or lumbar spine  Skin:    General: Skin is warm and dry.  Neurological:     General: No focal deficit present.     Mental Status: She is alert and oriented to person, place, and time.     GCS: GCS eye subscore is 4. GCS verbal subscore is 5. GCS motor subscore is 6.     Cranial Nerves: No cranial nerve deficit or facial asymmetry.     Sensory: Sensation is intact.     Motor: No weakness, tremor, seizure activity or pronator drift.     Coordination: Finger-Nose-Finger Test normal.     Comments: CN II-XII intact; performed in supine position, +5 strength to bilateral upper extremities, +5 strength to dorsiflexion and plantarflexion, patient able to left both legs against gravity and hold each there without difficulty.  Sensation to light touch intact to bilateral upper and lower extremities  Psychiatric:        Behavior: Behavior is cooperative.    ED Results / Procedures / Treatments   Labs (all labs ordered are listed, but only abnormal results are displayed) Labs Reviewed  BRAIN NATRIURETIC PEPTIDE  CBC WITH DIFFERENTIAL/PLATELET  COMPREHENSIVE METABOLIC PANEL  LIPASE, BLOOD  URINALYSIS, ROUTINE W REFLEX MICROSCOPIC  TROPONIN I (HIGH SENSITIVITY)    EKG EKG Interpretation  Date/Time:  Monday December 21 2020 14:33:15 EDT Ventricular Rate:  85 PR Interval:  158 QRS  Duration: 123 QT Interval:  422 QTC Calculation: 502 R Axis:   -68 Text Interpretation: Sinus rhythm Left atrial enlargement LVH with IVCD, LAD and secondary repol abnrm Borderline prolonged QT interval unchanged from previous Confirmed by Lavenia Atlas [phone]) on 12/21/2020 2:49:29 PM  Radiology No results found.  Procedures Procedures     Medications Ordered  in ED Medications  nitroGLYCERIN (NITROSTAT) SL tablet 0.4 mg (has no administration in time range)    ED Course  I have reviewed the triage vital signs and the nursing notes.  Pertinent labs & imaging results that were available during my care of the patient were reviewed by me and considered in my medical decision making (see chart for details).  Clinical Course as of 12/21/20 1532  Mon Dec 21, 2020  1530 Taking handoff from [place], [place]. Extensive cardiac history. Here with CP, exertional. Started at 1330 today. ACS w/u pending. Ordered for SL nitro to get here. Chronic headaches. Will probably have to come in for unstable angina. [ZB]    Clinical Course User Index [ZB] Pearson Grippe, DO   MDM Rules/Calculators/A&P                          Alert 50 year old female no acute distress, nontoxic-appearing.  Patient presents emerged department with a chief complaint of chest pain, headache, and hypertension.  Has been noncompliant on all medications for the last 4 months due to cost.  Patient has had headaches intermittently since stopping her medication.  Patient reports that headache is located to frontotemporal aspect of head, vaginal in onset and pain progressively worse.  Patient reports that she has headache most every day.  Patient denies any associated neurological deficits.  Patient states that she has had chest pain intermittently over the last week.  Chest pain is brought on with exertion.  Chest pain has gotten worse over this weekend.  Patient's current episode of chest  pain started today at 1330 and has  been constant since then.  Pain is to left side of her chest and radiates around to the left back.  Pain is described as a pressure.  Patient endorses associated nausea, vomiting, diaphoresis and shortness of breath.  Patient received ASA and nitro x2 with EMS.  Patient had improvement in symptoms after receiving nitro.  On physical exam patient has no focal neurological deficit.  Suspect that patient's headache may be due to elevated blood pressures resulted from her noncompliance with medication.  Patient has heart score of 6.  ACS work-up initiated.  We will add BNP due to patient's history of CHF however she does not appear fluid overloaded at this time.  We will give patient sublingual nitro and reassess chest pain.  Suspect that patient may need to be brought into hospital for observation due to unstable angina.  Will evaluate patient CMP and lipase for possible cause of epigastric discomfort.  All labs and chest x-ray pending at this time.  Patient care transferred to Pearson Grippe, DO  at the end of my shift. Patient presentation, ED course, and plan of care discussed with review of all pertinent labs and imaging. Please see his/her note for further details regarding further ED course and disposition.   Final Clinical Impression(s) / ED Diagnoses Final diagnoses:  None    Rx / DC Orders ED Discharge Orders     None        Loni Beckwith, PA-C 12/21/20 1533    Horton, Alvin Critchley, DO 12/24/20 1607

## 2020-12-22 ENCOUNTER — Observation Stay (HOSPITAL_COMMUNITY): Payer: Medicaid Other

## 2020-12-22 ENCOUNTER — Other Ambulatory Visit: Payer: Self-pay

## 2020-12-22 DIAGNOSIS — I340 Nonrheumatic mitral (valve) insufficiency: Secondary | ICD-10-CM

## 2020-12-22 DIAGNOSIS — I428 Other cardiomyopathies: Secondary | ICD-10-CM

## 2020-12-22 DIAGNOSIS — I361 Nonrheumatic tricuspid (valve) insufficiency: Secondary | ICD-10-CM

## 2020-12-22 DIAGNOSIS — R0609 Other forms of dyspnea: Secondary | ICD-10-CM

## 2020-12-22 DIAGNOSIS — I429 Cardiomyopathy, unspecified: Secondary | ICD-10-CM

## 2020-12-22 LAB — BASIC METABOLIC PANEL
Anion gap: 8 (ref 5–15)
BUN: 21 mg/dL — ABNORMAL HIGH (ref 6–20)
CO2: 24 mmol/L (ref 22–32)
Calcium: 9 mg/dL (ref 8.9–10.3)
Chloride: 106 mmol/L (ref 98–111)
Creatinine, Ser: 1.09 mg/dL — ABNORMAL HIGH (ref 0.44–1.00)
GFR, Estimated: >60 mL/min (ref >60)
Glucose, Bld: 112 mg/dL — ABNORMAL HIGH (ref 70–99)
Potassium: 3.7 mmol/L (ref 3.5–5.1)
Sodium: 138 mmol/L (ref 135–145)

## 2020-12-22 LAB — ECHOCARDIOGRAM COMPLETE
Calc EF: 33.9 %
Height: 67 in
MV M vel: 5.84 m/s
MV Peak grad: 136.4 mmHg
Radius: 0.4 cm
S' Lateral: 5 cm
Single Plane A2C EF: 29 %
Single Plane A4C EF: 36.3 %
Weight: 2564.39 oz

## 2020-12-22 LAB — CBC
HCT: 39.4 % (ref 36.0–46.0)
Hemoglobin: 12.7 g/dL (ref 12.0–15.0)
MCH: 27.1 pg (ref 26.0–34.0)
MCHC: 32.2 g/dL (ref 30.0–36.0)
MCV: 84 fL (ref 80.0–100.0)
Platelets: 207 10*3/uL (ref 150–400)
RBC: 4.69 MIL/uL (ref 3.87–5.11)
RDW: 14.9 % (ref 11.5–15.5)
WBC: 6.4 10*3/uL (ref 4.0–10.5)
nRBC: 0 % (ref 0.0–0.2)

## 2020-12-22 MED ORDER — POLYETHYLENE GLYCOL 3350 17 G PO PACK
17.0000 g | PACK | Freq: Every day | ORAL | Status: DC | PRN
  Administered 2020-12-22: 17 g via ORAL
  Filled 2020-12-22: qty 1

## 2020-12-22 NOTE — Progress Notes (Signed)
  Echocardiogram Echocardiogram Transesophageal has been performed.  Fidel Levy 12/22/2020, 3:01 PM

## 2020-12-22 NOTE — Progress Notes (Signed)
PROGRESS NOTE    Emily Key  YIF:[id number] DOB: 24-Dec-1970 DOA: 12/21/2020 PCP: Azzie Glatter, FNP (Inactive)    Brief Narrative:  50 year old female presents to the hospital with complaints of chest pain, headache, shortness of breath.  Noted to have systolic blood pressure 867/672.  Admitted for hypertensive urgency.  Echo shows ejection fraction of 20 to 25%.  Cardiology consulted.   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 -She has had a decline in EF -Previous echo from 12/2018 showed ejection fraction of 40%, this is dropped down to 20 to 25% on current echo -We will request cardiology consultation regarding any further work-up and to help optimize her medications    DVT prophylaxis: enoxaparin (LOVENOX) injection 40 mg Start: 12/21/20 2300  Code Status: Full code Family Communication: Discussed with patient Disposition Plan: Status is: Inpatient  The patient will require care spanning > 2 midnights and should be moved to inpatient because: Ongoing diagnostic testing needed not appropriate for outpatient work up  Dispo: The patient is from: Home              Anticipated d/c is to: Home              Patient currently is not medically stable to d/c.   Difficult to place patient No         Consultants:    Procedures:  Echocardiogram: EF 20 to 09%, grade 3 diastolic dysfunction with global hypokinesis  Antimicrobials:     Subjective: No chest pain, she does  feel short of breath.  Objective: Vitals:   12/22/20 1059 12/22/20 1533 12/22/20 1604 12/22/20 1750  BP: (!) 159/101 (!) 163/98 (!) 150/85   Pulse: (!) 57 63 (!) 57 63  Resp: 16 16 16    Temp: 98.3 F (36.8 C)  98.3 F (36.8 C)   TempSrc: Oral     SpO2: 100%  100%   Weight: 72.7 kg     Height: 5\' 7"  (1.702 m)      No intake or output data in the 24 hours ending 12/22/20 1849 Filed Weights   12/21/20 2238 12/22/20 1059  Weight: 76.7 kg 72.7 kg    Examination:  General exam: Appears calm and comfortable  Respiratory system: Clear to auscultation. Respiratory effort normal. Cardiovascular system: S1 & S2 heard, RRR. No JVD, murmurs, rubs, gallops or clicks. No pedal edema. Gastrointestinal system: Abdomen is nondistended, soft and nontender. No organomegaly or masses felt. Normal bowel sounds heard. Central nervous system: Alert and oriented. No focal neurological deficits. Extremities: Symmetric 5 x 5 power. Skin: No rashes, lesions or ulcers Psychiatry: Judgement and insight appear normal. Mood & affect appropriate.     Data Reviewed: I have personally reviewed following labs and imaging studies  CBC: Recent Labs  Lab 12/21/20 1647 12/22/20 0433  WBC 7.2 6.4  NEUTROABS 5.4  --   HGB 13.7 12.7  HCT 42.1 39.4  MCV 82.9 84.0  PLT 229 470   Basic Metabolic Panel: Recent Labs  Lab 12/21/20 1647 12/22/20 0433  NA 138  138  K 3.2* 3.7  CL 104 106  CO2 23 24  GLUCOSE 104* 112*  BUN 22* 21*  CREATININE 1.15* 1.09*  CALCIUM 9.1 9.0   GFR: Estimated Creatinine Clearance: 59.4 mL/min (A) (by C-G formula based on SCr of 1.09 mg/dL (H)). Liver Function Tests: Recent Labs  Lab 12/21/20 1647  AST 25  ALT 21  ALKPHOS 61  BILITOT 1.3*  PROT 6.5  ALBUMIN 3.6   Recent Labs  Lab 12/21/20 1647  LIPASE 20   No results for input(s): AMMONIA in the last 168 hours. Coagulation Profile: No results for input(s): INR, PROTIME in the last 168 hours. Cardiac  Enzymes: No results for input(s): CKTOTAL, CKMB, CKMBINDEX, TROPONINI in the last 168 hours. BNP (last 3 results) No results for input(s): PROBNP in the last 8760 hours. HbA1C: No results for input(s): HGBA1C in the last 72 hours. CBG: No results for input(s): GLUCAP in the last 168 hours. Lipid Profile: No results for input(s): CHOL, HDL, LDLCALC, TRIG, CHOLHDL, LDLDIRECT in the last 72 hours. Thyroid Function Tests: No results for input(s): TSH, T4TOTAL, FREET4, T3FREE, THYROIDAB in the last 72 hours. Anemia Panel: No results for input(s): VITAMINB12, FOLATE, FERRITIN, TIBC, IRON, RETICCTPCT in the last 72 hours. Sepsis Labs: No results for input(s): PROCALCITON, LATICACIDVEN in the last 168 hours.  No results found for this or any previous visit (from the past 240 hour(s)).       Radiology Studies: DG Chest Port 1 View  Result Date: 12/21/2020 CLINICAL DATA:  Chest pain EXAM: PORTABLE CHEST 1 VIEW COMPARISON:  January 04, 2019 FINDINGS: There is cardiomegaly with pulmonary vascularity normal. There is mild left lower lobe atelectatic change. No edema or airspace opacity. No adenopathy. No pneumothorax. No bone lesions. IMPRESSION: Mild left lower lobe atelectasis. No edema or airspace opacity. There is cardiomegaly with pulmonary vascularity within normal limits. Electronically Signed   By: Lowella Grip III M.D.   On: 12/21/2020 15:43   ECHOCARDIOGRAM COMPLETE  Result Date: 12/22/2020    ECHOCARDIOGRAM REPORT   Patient Name:   Emily Key Date of Exam: 12/22/2020 Medical Rec #:  [id number]     Height:       67.0 in Accession #:    [id number]    Weight:       160.3 lb Date of Birth:  02-11-1971     BSA:          1.841 m Patient Age:    50 years      BP:           159/101 mmHg Patient Gender: F             HR:           64 bpm. Exam Location:  Inpatient Procedure: 2D Echo, Cardiac Doppler and Color Doppler Indications:    dyspnea  History:        Patient has prior history of  Echocardiogram examinations, most                 recent 01/02/2019. CHF, Signs/Symptoms:Dyspnea; Risk                 Factors:Dyslipidemia and Hypertension.  Sonographer:    Bernadene Person RDCS Referring Phys: [place]  1. Left ventricular ejection fraction, by estimation, is 20 to 25%. The left ventricle has severely decreased function. The left ventricle demonstrates global hypokinesis. The left ventricular internal cavity size was moderately dilated. Left ventricular diastolic parameters are  consistent with Grade III diastolic dysfunction (restrictive).  2. Right ventricular systolic function is mildly reduced. The right ventricular size is normal. There is severely elevated pulmonary artery systolic pressure.  3. Left atrial size was severely dilated.  4. The mitral valve is normal in structure. Moderate mitral valve regurgitation. No evidence of mitral stenosis.  5. Tricuspid valve regurgitation is moderate.  6. The aortic valve is tricuspid. Aortic valve regurgitation is not visualized. No aortic stenosis is present.  7. The inferior vena cava is dilated in size with <50% respiratory variability, suggesting right atrial pressure of 15 mmHg. FINDINGS  Left Ventricle: Left ventricular ejection fraction, by estimation, is 20 to 25%. The left ventricle has severely decreased function. The left ventricle demonstrates global hypokinesis. The left ventricular internal cavity size was moderately dilated. There is no left ventricular hypertrophy. Left ventricular diastolic parameters are consistent with Grade III diastolic dysfunction (restrictive). Right Ventricle: The right ventricular size is normal. Right ventricular systolic function is mildly reduced. There is severely elevated pulmonary artery systolic pressure. The tricuspid regurgitant velocity is 3.37 m/s, and with an assumed right atrial pressure of 15 mmHg, the estimated right ventricular systolic pressure is 13.2 mmHg. Left Atrium:  Left atrial size was severely dilated. Right Atrium: Right atrial size was normal in size. Pericardium: Trivial pericardial effusion is present. Mitral Valve: The mitral valve is normal in structure. Moderate mitral valve regurgitation. No evidence of mitral valve stenosis. Tricuspid Valve: The tricuspid valve is normal in structure. Tricuspid valve regurgitation is moderate . No evidence of tricuspid stenosis. Aortic Valve: The aortic valve is tricuspid. Aortic valve regurgitation is not visualized. No aortic stenosis is present. Pulmonic Valve: The pulmonic valve was normal in structure. Pulmonic valve regurgitation is mild. No evidence of pulmonic stenosis. Aorta: The aortic root is normal in size and structure. Venous: The inferior vena cava is dilated in size with less than 50% respiratory variability, suggesting right atrial pressure of 15 mmHg. IAS/Shunts: No atrial level shunt detected by color flow Doppler.  LEFT VENTRICLE PLAX 2D LVIDd:         6.20 cm      Diastology LVIDs:         5.00 cm      LV e' medial:  4.05 cm/s LV PW:         1.00 cm      LV e' lateral: 4.61 cm/s LV IVS:        0.80 cm LVOT diam:     2.00 cm LV SV:         36 LV SV Index:   20 LVOT Area:     3.14 cm  LV Volumes (MOD) LV vol d, MOD A2C: 186.0 ml LV vol d, MOD A4C: 151.0 ml LV vol s, MOD A2C: 132.0 ml LV vol s, MOD A4C: 96.2 ml LV SV MOD A2C:     54.0 ml LV SV MOD A4C:     151.0 ml LV SV MOD BP:      58.3 ml RIGHT VENTRICLE RV S prime:     10.20 cm/s TAPSE (M-mode): 1.4 cm LEFT ATRIUM             Index       RIGHT ATRIUM           Index LA diam:        4.20 cm 2.28 cm/m  RA Area:     13.90 cm LA Vol (A2C):   74.3 ml 40.37 ml/m  RA Volume:   28.60 ml  15.54 ml/m LA Vol (A4C):   96.9 ml 52.65 ml/m LA Biplane Vol: 87.7 ml 47.65 ml/m  AORTIC VALVE LVOT Vmax:   73.60 cm/s LVOT Vmean:  51.600 cm/s LVOT VTI:    0.116 m  AORTA Ao Root diam: 2.80 cm Ao Asc diam:  3.10 cm MR Peak grad:    136.4 mmHg  TRICUSPID VALVE MR Mean grad:     79.0 mmHg   TR Peak grad:   45.4 mmHg MR Vmax:         584.00 cm/s TR Vmax:        337.00 cm/s MR Vmean:        405.0 cm/s MR PISA:         1.01 cm    SHUNTS MR PISA Eff ROA: 7 mm       Systemic VTI:  0.12 m MR PISA Radius:  0.40 cm     Systemic Diam: 2.00 cm Kirk Ruths MD Electronically signed by Kirk Ruths MD Signature Date/Time: 12/22/2020/3:24:48 PM    Final         Scheduled Meds:  carvedilol  25 mg Oral BID WC   enoxaparin (LOVENOX) injection  40 mg Subcutaneous Q24H   hydrALAZINE  50 mg Oral Q8H   hydrochlorothiazide  25 mg Oral Daily   lisinopril  10 mg Oral Daily   Continuous Infusions:   LOS: 0 days    Time spent: 64mins    Kathie Dike, MD [facility]   If 7PM-7AM, please contact night-coverage [email or web address]  12/22/2020, 6:49 PM

## 2020-12-22 NOTE — ED Notes (Signed)
All pt belongings packed in bags and transported with pt to floor.

## 2020-12-23 DIAGNOSIS — Z720 Tobacco use: Secondary | ICD-10-CM

## 2020-12-23 DIAGNOSIS — I272 Pulmonary hypertension, unspecified: Secondary | ICD-10-CM

## 2020-12-23 LAB — BASIC METABOLIC PANEL
Anion gap: 5 (ref 5–15)
BUN: 19 mg/dL (ref 6–20)
CO2: 26 mmol/L (ref 22–32)
Calcium: 8.8 mg/dL — ABNORMAL LOW (ref 8.9–10.3)
Chloride: 102 mmol/L (ref 98–111)
Creatinine, Ser: 1.12 mg/dL — ABNORMAL HIGH (ref 0.44–1.00)
GFR, Estimated: 60 mL/min — ABNORMAL LOW (ref >60)
Glucose, Bld: 92 mg/dL (ref 70–99)
Potassium: 3.9 mmol/L (ref 3.5–5.1)
Sodium: 133 mmol/L — ABNORMAL LOW (ref 135–145)

## 2020-12-23 LAB — SARS CORONAVIRUS 2 (TAT 6-24 HRS): SARS Coronavirus 2: NEGATIVE

## 2020-12-23 MED ORDER — ZOLPIDEM TARTRATE 5 MG PO TABS
5.0000 mg | ORAL_TABLET | Freq: Every evening | ORAL | Status: DC | PRN
  Administered 2020-12-23 – 2020-12-25 (×3): 5 mg via ORAL
  Filled 2020-12-23 (×3): qty 1

## 2020-12-23 MED ORDER — SODIUM CHLORIDE 0.9 % IV SOLN: 250.0000 mL | INTRAVENOUS | Status: DC | PRN

## 2020-12-23 MED ORDER — ASPIRIN 81 MG PO CHEW
81.0000 mg | CHEWABLE_TABLET | ORAL | Status: AC
  Administered 2020-12-24: 81 mg via ORAL
  Filled 2020-12-23: qty 1

## 2020-12-23 MED ORDER — LOSARTAN POTASSIUM 50 MG PO TABS
100.0000 mg | ORAL_TABLET | Freq: Every day | ORAL | Status: DC
  Administered 2020-12-23: 100 mg via ORAL
  Filled 2020-12-23: qty 2

## 2020-12-23 MED ORDER — SODIUM CHLORIDE 0.9 % IV SOLN: INTRAVENOUS | Status: DC

## 2020-12-23 MED ORDER — SPIRONOLACTONE 25 MG PO TABS
25.0000 mg | ORAL_TABLET | Freq: Every day | ORAL | Status: DC
  Administered 2020-12-23 – 2020-12-26 (×3): 25 mg via ORAL
  Filled 2020-12-23 (×4): qty 1

## 2020-12-23 MED ORDER — SODIUM CHLORIDE 0.9% FLUSH: 3.0000 mL | INTRAVENOUS | Status: DC | PRN

## 2020-12-23 MED ORDER — SODIUM CHLORIDE 0.9% FLUSH
3.0000 mL | Freq: Two times a day (BID) | INTRAVENOUS | Status: DC
  Administered 2020-12-23 – 2020-12-26 (×5): 3 mL via INTRAVENOUS

## 2020-12-23 NOTE — Progress Notes (Signed)
[facility] PROGRESS NOTE    Emily Key  XBD:[id number] DOB: March 02, 1971 DOA: 12/21/2020 PCP: Azzie Glatter, FNP (Inactive)      Brief Narrative:  Mrs. Emily Key is a 50 y.o. F with hx HTN poorly controlled, sCHF last EF 40s, hx empyema s/p VATS in 2015, and smoking who presented with exertional CP and SOB for several weeks progressive.  In the ER, BP 220/140, BNP 1200 and CXR with CM.           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  Cardiology consulted.  Only negative 500 yesterday -Continue furosemide IV -Consult Cardiololgy  -Start spironolactone -Stop lisinopril, start losartan -Continue carvedilol -Continue hydralazine    Hypertensive urgency -Start spironolactone -Stop lisinopril, start losartan -Continue carvedilol and hydralazine  Smoking Cessation recommended  Hypokaelmia Repleted, resolved  Low sodium Trivial, asymptomatic, not hyponatemia           Disposition: Status is: Inpatient  Remains inpatient appropriate because:Ongoing diagnostic testing needed not appropriate for outpatient work up  Dispo: The patient is from: Home              Anticipated d/c is to: Home              Patient currently is not medically stable to d/c.   Difficult to place patient No       Level of care: Telemetry Cardiac       MDM: The below labs and imaging reports were reviewed and summarized above.  Medication management as above.  This is a severe exacberation of her chronic disease     DVT prophylaxis: enoxaparin (LOVENOX) injection 40 mg Start: 12/21/20 2300  Code Status:  FULL Family Communication: Son at bedside             Subjective: Orthopneic, still short of breath with exertion.  Ankle edema resolved.  No confusion, chest pain, cough, sputum.  Objective: Vitals:   12/23/20  0348 12/23/20 0822 12/23/20 0854 12/23/20 1243  BP: (!) 178/91 (!) 169/97 (!) 174/106 (!) 164/89  Pulse: 60 63 60 (!) 58  Resp:  20 19 19   Temp:  97.9 F (36.6 C) 97.7 F (36.5 C) 98.3 F (36.8 C)  TempSrc:  Oral Oral Oral  SpO2:   99% 100%  Weight: 71.3 kg     Height:        Intake/Output Summary (Last 24 hours) at 12/23/2020 1553 Last data filed at 12/23/2020 0859 Gross per 24 hour  Intake 358 ml  Output 750 ml  Net -392 ml   Filed Weights   12/21/20 2238 12/22/20 1059 12/23/20 0348  Weight: 76.7 kg 72.7 kg 71.3 kg    Examination: General appearance: Thin adult female, alert and in no acute distress.  Lying in bed. HEENT: Anicteric, conjunctiva pink, lids and lashes normal. No nasal deformity, discharge, epistaxis.  Lips moist, dentition in good repair, OP moist, no oral lesions.   Skin:   Cardiac: RRR, nl S1-S2, S4 noted.  Capillary refill is brisk.  JVP only 1-2cm above clavicle.  No LE edema.  Radial pulses 2+ and symmetric. Respiratory: Normal respiratory rate and rhythm.  CTAB without rales or wheezes. Abdomen: Abdomen soft.  Moderate diffuse TTP, no rebound, mild guarding. No ascites, distension, hepatosplenomegaly.   MSK: No deformities or effusions. Neuro: Awake and alert.  EOMI, moves all extremities. Speech fluent.  Psych: Sensorium intact and responding to questions, attention normal. Affect normal.  Judgment and insight appear normal.    Data Reviewed: I have personally reviewed following labs and imaging studies:  CBC: Recent Labs  Lab 12/21/20 1647 12/22/20 0433  WBC 7.2 6.4  NEUTROABS 5.4  --   HGB 13.7 12.7  HCT 42.1 39.4  MCV 82.9 84.0  PLT 229 656   Basic Metabolic Panel: Recent Labs  Lab 12/21/20 1647 12/22/20 0433 12/23/20 0752  NA 138 138 133*  K 3.2* 3.7 3.9  CL 104 106 102  CO2 23 24 26   GLUCOSE 104* 112* 92  BUN 22* 21* 19  CREATININE 1.15* 1.09* 1.12*  CALCIUM 9.1 9.0 8.8*   GFR: Estimated Creatinine Clearance: 57.8  mL/min (A) (by C-G formula based on SCr of 1.12 mg/dL (H)). Liver Function Tests: Recent Labs  Lab 12/21/20 1647  AST 25  ALT 21  ALKPHOS 61  BILITOT 1.3*  PROT 6.5  ALBUMIN 3.6   Recent Labs  Lab 12/21/20 1647  LIPASE 20   No results for input(s): AMMONIA in the last 168 hours. Coagulation Profile: No results for input(s): INR, PROTIME in the last 168 hours. Cardiac Enzymes: No results for input(s): CKTOTAL, CKMB, CKMBINDEX, TROPONINI in the last 168 hours. BNP (last 3 results) No results for input(s): PROBNP in the last 8760 hours. HbA1C: No results for input(s): HGBA1C in the last 72 hours. CBG: No results for input(s): GLUCAP in the last 168 hours. Lipid Profile: No results for input(s): CHOL, HDL, LDLCALC, TRIG, CHOLHDL, LDLDIRECT in the last 72 hours. Thyroid Function Tests: No results for input(s): TSH, T4TOTAL, FREET4, T3FREE, THYROIDAB in the last 72 hours. Anemia Panel: No results for input(s): VITAMINB12, FOLATE, FERRITIN, TIBC, IRON, RETICCTPCT in the last 72 hours. Urine analysis:    Component Value Date/Time   COLORURINE YELLOW 01/02/2019 0354   APPEARANCEUR CLEAR 01/02/2019 0354   LABSPEC 1.012 01/02/2019 0354   PHURINE 6.0 01/02/2019 0354   GLUCOSEU 150 (A) 01/02/2019 0354   HGBUR SMALL (A) 01/02/2019 0354   BILIRUBINUR negative 02/22/2019 1405   KETONESUR negative 02/22/2019 1405   KETONESUR NEGATIVE 01/02/2019 0354   PROTEINUR negative 02/22/2019 1405   PROTEINUR >=300 (A) 01/02/2019 0354   UROBILINOGEN 0.2 02/22/2019 1405   UROBILINOGEN 0.2 02/09/2015 1242   NITRITE Negative 02/22/2019 1405   NITRITE NEGATIVE 01/02/2019 0354   LEUKOCYTESUR Trace (A) 02/22/2019 1405   LEUKOCYTESUR NEGATIVE 01/02/2019 0354   Sepsis Labs: @LABRCNTIP (procalcitonin:4,lacticacidven:4)  ) Recent Results (from the past 240 hour(s))  SARS CORONAVIRUS 2 (TAT 6-24 HRS) Nasopharyngeal Nasopharyngeal Swab     Status: None   Collection Time: 12/23/20  7:09 AM    Specimen: Nasopharyngeal Swab  Result Value Ref Range Status   SARS Coronavirus 2 NEGATIVE NEGATIVE Final    Comment: (NOTE) SARS-CoV-2 target nucleic acids are NOT DETECTED.  The SARS-CoV-2 RNA is generally detectable in upper and lower respiratory specimens during the acute phase of infection. Negative results do not preclude SARS-CoV-2 infection, do not rule out co-infections with other pathogens, and should not be used as the sole basis for treatment or other patient management decisions. Negative results must be combined with clinical observations, patient history, and epidemiological information. The expected result is Negative.  Fact Sheet for Patients: SugarRoll.be  Fact Sheet for Healthcare Providers: https://www.woods-mathews.com/  This test is not yet approved or cleared by the [place] FDA and  has been authorized for detection and/or diagnosis of SARS-CoV-2 by FDA under  an Emergency Use Authorization (EUA). This EUA will remain  in effect (meaning this test can be used) for the duration of the COVID-19 declaration under Se ction 564(b)(1) of the Act, 21 U.S.C. section 360bbb-3(b)(1), unless the authorization is terminated or revoked sooner.  Performed at [facility], [place] [address] [place], [place] [address]          Radiology Studies: ECHOCARDIOGRAM COMPLETE  Result Date: 12/22/2020    ECHOCARDIOGRAM REPORT   Patient Name:   Emily Key Date of Exam: 12/22/2020 Medical Rec #:  [id number]     Height:       67.0 in Accession #:    [id number]    Weight:       160.3 lb Date of Birth:  1970/08/03     BSA:          1.841 m Patient Age:    50 years      BP:           159/101 mmHg Patient Gender: F             HR:           64 bpm. Exam Location:  Inpatient Procedure: 2D Echo, Cardiac Doppler and Color Doppler Indications:    dyspnea  History:        Patient has prior history of Echocardiogram examinations, most                  recent 01/02/2019. CHF, Signs/Symptoms:Dyspnea; Risk                 Factors:Dyslipidemia and Hypertension.  Sonographer:    Bernadene Person RDCS Referring Phys: [place]  1. Left ventricular ejection fraction, by estimation, is 20 to 25%. The left ventricle has severely decreased function. The left ventricle demonstrates global hypokinesis. The left ventricular internal cavity size was moderately dilated. Left ventricular diastolic parameters are consistent with Grade III diastolic dysfunction (restrictive).  2. Right ventricular systolic function is mildly reduced. The right ventricular size is normal. There is severely elevated pulmonary artery systolic pressure.  3. Left atrial size was severely dilated.  4. The mitral valve is normal in structure. Moderate mitral valve regurgitation. No evidence of mitral stenosis.  5. Tricuspid valve regurgitation is moderate.  6. The aortic valve is tricuspid. Aortic valve regurgitation is not visualized. No aortic stenosis is present.  7. The inferior vena cava is dilated in size with <50% respiratory variability, suggesting right atrial pressure of 15 mmHg. FINDINGS  Left Ventricle: Left ventricular ejection fraction, by estimation, is 20 to 25%. The left ventricle has severely decreased function. The left ventricle demonstrates global hypokinesis. The left ventricular internal cavity size was moderately dilated. There is no left ventricular hypertrophy. Left ventricular diastolic parameters are consistent with Grade III diastolic dysfunction (restrictive). Right Ventricle: The right ventricular size is normal. Right ventricular systolic function is mildly reduced. There is severely elevated pulmonary artery systolic pressure. The tricuspid regurgitant velocity is 3.37 m/s, and with an assumed right atrial pressure of 15 mmHg, the estimated right ventricular systolic pressure is 28.3 mmHg. Left Atrium: Left atrial size was severely dilated.  Right Atrium: Right atrial size was normal in size. Pericardium: Trivial pericardial effusion is present. Mitral Valve: The mitral valve is normal in structure. Moderate mitral valve regurgitation. No evidence of mitral valve stenosis. Tricuspid Valve: The tricuspid valve is normal in structure. Tricuspid valve regurgitation is moderate . No evidence of tricuspid stenosis. Aortic Valve:  The aortic valve is tricuspid. Aortic valve regurgitation is not visualized. No aortic stenosis is present. Pulmonic Valve: The pulmonic valve was normal in structure. Pulmonic valve regurgitation is mild. No evidence of pulmonic stenosis. Aorta: The aortic root is normal in size and structure. Venous: The inferior vena cava is dilated in size with less than 50% respiratory variability, suggesting right atrial pressure of 15 mmHg. IAS/Shunts: No atrial level shunt detected by color flow Doppler.  LEFT VENTRICLE PLAX 2D LVIDd:         6.20 cm      Diastology LVIDs:         5.00 cm      LV e' medial:  4.05 cm/s LV PW:         1.00 cm      LV e' lateral: 4.61 cm/s LV IVS:        0.80 cm LVOT diam:     2.00 cm LV SV:         36 LV SV Index:   20 LVOT Area:     3.14 cm  LV Volumes (MOD) LV vol d, MOD A2C: 186.0 ml LV vol d, MOD A4C: 151.0 ml LV vol s, MOD A2C: 132.0 ml LV vol s, MOD A4C: 96.2 ml LV SV MOD A2C:     54.0 ml LV SV MOD A4C:     151.0 ml LV SV MOD BP:      58.3 ml RIGHT VENTRICLE RV S prime:     10.20 cm/s TAPSE (M-mode): 1.4 cm LEFT ATRIUM             Index       RIGHT ATRIUM           Index LA diam:        4.20 cm 2.28 cm/m  RA Area:     13.90 cm LA Vol (A2C):   74.3 ml 40.37 ml/m RA Volume:   28.60 ml  15.54 ml/m LA Vol (A4C):   96.9 ml 52.65 ml/m LA Biplane Vol: 87.7 ml 47.65 ml/m  AORTIC VALVE LVOT Vmax:   73.60 cm/s LVOT Vmean:  51.600 cm/s LVOT VTI:    0.116 m  AORTA Ao Root diam: 2.80 cm Ao Asc diam:  3.10 cm MR Peak grad:    136.4 mmHg  TRICUSPID VALVE MR Mean grad:    79.0 mmHg   TR Peak grad:   45.4 mmHg MR  Vmax:         584.00 cm/s TR Vmax:        337.00 cm/s MR Vmean:        405.0 cm/s MR PISA:         1.01 cm    SHUNTS MR PISA Eff ROA: 7 mm       Systemic VTI:  0.12 m MR PISA Radius:  0.40 cm     Systemic Diam: 2.00 cm Kirk Ruths MD Electronically signed by Kirk Ruths MD Signature Date/Time: 12/22/2020/3:24:48 PM    Final         Scheduled Meds:  carvedilol  25 mg Oral BID WC   enoxaparin (LOVENOX) injection  40 mg Subcutaneous Q24H   hydrALAZINE  50 mg Oral Q8H   losartan  100 mg Oral Daily   sodium chloride flush  3 mL Intravenous Q12H   spironolactone  25 mg Oral Daily   Continuous Infusions:   LOS: 1 day    Time spent: 35 minutes    [employer],  MD [facility] 12/23/2020, 3:53 PM     Please page though [place] or Epic secure chat:  For [employer], Adult nurse

## 2020-12-23 NOTE — Consult Note (Addendum)
Advanced Heart Failure Team Consult Note   Primary Physician: Azzie Glatter, FNP (Inactive) PCP-Cardiologist:  None  Reason for Consultation: Acute on chronic systolic heart failure   HPI:    Emily Key is seen today for evaluation of acute on chronic systolic heart failure/ drop in EF,  at the request of Dr. Loleta Books, Internal Medicine.    50 y/o AAF w/ h/o HTN and chronic systolic heart failure. In May 2016, she underwent left VATS w/ drainage of empyema/ abscess and decortication by Dr. Roxan Hockey. She had an echocardiogram around that time showing normal LVEF 50-55%. She was admitted again 02/2015 w/ gastroenteritis/ sepsis and HTN urgency. Cardiology consulted for elevated troponin. Echo showed mildly reduced LVEF 40-45%, RV normal. Subsequent LHC showed normal coronaries. Drop in EF felt to be stress induced. Unfortunately, she did not f/u w/ cardiology following that hospitalization.   Pt presented to ED on 5/13 w/ CC of worsening, intermittent CP and exertional dyspnea over the last week. She had stopped taking all of her BP meds ~4 months ago due to financial reasons. In ED, was markedly hypertensive, BP 220/137. BNP 1290, Hs trop 35>>34. EKG showed NSR w/ LVH. K 3.2, Scr 1.15. CXR w/ cardiomegaly w/ no frank edema.   She was admitted and restarted on PO antihypertensives and IV Lasix. BP remains elevated but overall improved. Echo shows severely reduced EF, now 20-25%, GIIIDD, no LVH, Global HK, RV mildly reduced, RVSP 60 mmHg, mod MR, Mod TR.    Cardiac Studies   Echo 11/2014:  LVEF 50-55%, RV normal  Echo 02/2015: LVEF 40-45%, AK of apical myocardium, G2DD, RV normal  Echo 12/2018: LVEF 40-45%, RV mildly reduced  Echo 12/2020: LVEF 20-25%, GIIIDD, no LVH, Global HK, RV mildly reduced, RVSP 60 mmHg, mod MR, Mod TR   LHC 2016- normal coronaries   Social: smokes 3/4 ppd x 15 years    Review of Systems: [y] = yes, [ ]  = no   General: Weight gain [ ] ; Weight loss [ ] ;  Anorexia [ ] ; Fatigue [ ] ; Fever [ ] ; Chills [ ] ; Weakness [ ]   Cardiac: Chest pain/pressure [Y ]; Resting SOB [ ] ; Exertional SOB [ Y]; Orthopnea [ ] ; Pedal Edema [ ] ; Palpitations [ ] ; Syncope [ ] ; Presyncope [ ] ; Paroxysmal nocturnal dyspnea[ ]   Pulmonary: Cough [ ] ; Wheezing[ ] ; Hemoptysis[ ] ; Sputum [ ] ; Snoring [ ]   GI: Vomiting[ ] ; Dysphagia[ ] ; Melena[ ] ; Hematochezia [ ] ; Heartburn[ ] ; Abdominal pain [ ] ; Constipation [ ] ; Diarrhea [ ] ; BRBPR [ ]   GU: Hematuria[ ] ; Dysuria [ ] ; Nocturia[ ]   Vascular: Pain in legs with walking [ ] ; Pain in feet with lying flat [ ] ; Non-healing sores [ ] ; Stroke [ ] ; TIA [ ] ; Slurred speech [ ] ;  Neuro: Headaches[ ] ; Vertigo[ ] ; Seizures[ ] ; Paresthesias[ ] ;Blurred vision [ ] ; Diplopia [ ] ; Vision changes [ ]   Ortho/Skin: Arthritis [ ] ; Joint pain [ ] ; Muscle pain [ ] ; Joint swelling [ ] ; Back Pain [ ] ; Rash [ ]   Psych: Depression[ ] ; Anxiety[ ]   Heme: Bleeding problems [ ] ; Clotting disorders [ ] ; Anemia [ ]   Endocrine: Diabetes [ ] ; Thyroid dysfunction[ ]   Home Medications Prior to Admission medications   Medication Sig Start Date End Date Taking? Authorizing Provider  ibuprofen (ADVIL) 200 MG tablet Take 600 mg by mouth every 6 (six) hours as needed for mild pain or headache.   Yes [provider]  carvedilol (  COREG) 25 MG tablet Take 1 tablet (25 mg total) by mouth 2 (two) times daily with a meal. Patient not taking: Reported on 12/21/2020 01/05/19   Georgette Shell, MD  cetirizine (ZYRTEC) 10 MG tablet Take 1 tablet (10 mg total) by mouth daily. Patient not taking: Reported on 12/21/2020 01/21/19   Azzie Glatter, FNP  hydrALAZINE (APRESOLINE) 50 MG tablet Take 1 tablet (50 mg total) by mouth 3 (three) times daily. Patient not taking: Reported on 12/21/2020 01/05/19   Georgette Shell, MD  hydrochlorothiazide (HYDRODIURIL) 25 MG tablet Take 1 tablet (25 mg total) by mouth daily. Patient not taking: Reported on 12/21/2020 01/06/19    Georgette Shell, MD  lisinopril (ZESTRIL) 10 MG tablet Take 1 tablet (10 mg total) by mouth daily. Patient not taking: Reported on 12/21/2020 01/06/19   Georgette Shell, MD  nicotine (NICODERM CQ - DOSED IN MG/24 HOURS) 14 mg/24hr patch Place 1 patch (14 mg total) onto the skin daily. Patient not taking: Reported on 12/21/2020 01/06/19   Georgette Shell, MD  potassium chloride (K-DUR) 10 MEQ tablet Take 1 tablet (10 mEq total) by mouth daily. Patient not taking: Reported on 12/21/2020 01/05/19   Georgette Shell, MD  Vitamin D, Ergocalciferol, (DRISDOL) 1.25 MG (50000 UT) CAPS capsule Take 1 capsule (50,000 Units total) by mouth every 7 (seven) days. Patient not taking: Reported on 12/21/2020 01/24/19   Azzie Glatter, FNP    Past Medical History: Past Medical History:  Diagnosis Date   Cerumen impaction 01/2019   CHF (congestive heart failure) ([place])    after last pregnancy   Daily headache    Heart murmur    "born w/one":   History of hiatal hernia    Hyperlipidemia 01/2019   Hypertension    Hypertensive emergency 01/02/2019   Observed seizure-like activity ([place]) 01/02/2019   Pneumonia 11/2014   Seasonal allergies 01/2019   Vitamin D deficiency 01/2019    Past Surgical History: Past Surgical History:  Procedure Laterality Date   CARDIAC CATHETERIZATION N/A 02/13/2015   Procedure: Left Heart Cath and Coronary Angiography;  Surgeon: Troy Sine, MD;  Location: [place] [facility];  Service: Cardiovascular;  Laterality: N/A;   PLEURAL EFFUSION DRAINAGE Left 11/14/2014   Procedure: DRAINAGE OF LEFT PLEURAL EFFUSION;  Surgeon: Melrose Nakayama, MD;  Location: [place];  Service: Thoracic;  Laterality: Left;   TONSILLECTOMY AND ADENOIDECTOMY  ~ [place] (VATS)/DECORTICATION Left 11/14/2014   Procedure: LEFT VIDEO ASSISTED THORACOSCOPY (VATS)/DECORTICATION;  Surgeon: Melrose Nakayama, MD;  Location: [facility];  Service:  Thoracic;  Laterality: Left;    Family History: Family History  Problem Relation Age of Onset   Diabetes Mellitus II Mother    Hypertension Mother    Lung cancer Father     Social History: Social History   Socioeconomic History   Marital status: Divorced    Spouse name: Not on file   Number of children: Not on file   Years of education: Not on file   Highest education level: Not on file  Occupational History   Not on file  Tobacco Use   Smoking status: Former    Packs/day: 0.33    Years: 10.00    Pack years: 3.30    Types: Cigarettes    Quit date: 11/11/2014    Years since quitting: 6.1   Smokeless tobacco: Never  Substance and Sexual Activity   Alcohol  use: Yes    Alcohol/week: 0.0 standard drinks    Comment: 8/1/20176 "a couple beers a couple times/month"   Drug use: No   Sexual activity: Yes  Other Topics Concern   Not on file  Social History Narrative   Not on file   Social Determinants of Health   Financial Resource Strain: Not on file  Food Insecurity: Not on file  Transportation Needs: Not on file  Physical Activity: Not on file  Stress: Not on file  Social Connections: Not on file    Allergies:  No Known Allergies  Objective:    Vital Signs:   Temp:  [97.4 F (36.3 C)-98.3 F (36.8 C)] 98.3 F (36.8 C) (06/15 1243) Pulse Rate:  [53-63] 58 (06/15 1243) Resp:  [16-24] 19 (06/15 1243) BP: (150-178)/(85-109) 164/89 (06/15 1243) SpO2:  [99 %-100 %] 100 % (06/15 1243) Weight:  [71.3 kg] 71.3 kg (06/15 0348) Last BM Date: 12/18/20  Weight change: Filed Weights   12/21/20 2238 12/22/20 1059 12/23/20 0348  Weight: 76.7 kg 72.7 kg 71.3 kg    Intake/Output:   Intake/Output Summary (Last 24 hours) at 12/23/2020 1401 Last data filed at 12/23/2020 0859 Gross per 24 hour  Intake 358 ml  Output 750 ml  Net -392 ml      Physical Exam    General:  Well appearing. No resp difficulty HEENT: normal Neck: supple. JVP mildly elevated. Carotids 2+  bilat; no bruits. No lymphadenopathy or thyromegaly appreciated. Cor: PMI nondisplaced. Regular rate & rhythm. No rubs, gallops or murmurs. Lungs: clear Abdomen: soft, + diffuse tenderness, nondistended. No hepatosplenomegaly. No bruits or masses. Good bowel sounds. Extremities: no cyanosis, clubbing, rash, edema Neuro: alert & orientedx3, cranial nerves grossly intact. moves all 4 extremities w/o difficulty. Affect pleasant   Telemetry   NSR 80s  EKG    NSR w/ LVH, LAE   Labs   Basic Metabolic Panel: Recent Labs  Lab 12/21/20 1647 12/22/20 0433 12/23/20 0752  NA 138 138 133*  K 3.2* 3.7 3.9  CL 104 106 102  CO2 23 24 26   GLUCOSE 104* 112* 92  BUN 22* 21* 19  CREATININE 1.15* 1.09* 1.12*  CALCIUM 9.1 9.0 8.8*    Liver Function Tests: Recent Labs  Lab 12/21/20 1647  AST 25  ALT 21  ALKPHOS 61  BILITOT 1.3*  PROT 6.5  ALBUMIN 3.6   Recent Labs  Lab 12/21/20 1647  LIPASE 20   No results for input(s): AMMONIA in the last 168 hours.  CBC: Recent Labs  Lab 12/21/20 1647 12/22/20 0433  WBC 7.2 6.4  NEUTROABS 5.4  --   HGB 13.7 12.7  HCT 42.1 39.4  MCV 82.9 84.0  PLT 229 207    Cardiac Enzymes: No results for input(s): CKTOTAL, CKMB, CKMBINDEX, TROPONINI in the last 168 hours.  BNP: BNP (last 3 results) Recent Labs    12/21/20 1648  BNP 1,290.8*    ProBNP (last 3 results) No results for input(s): PROBNP in the last 8760 hours.   CBG: No results for input(s): GLUCAP in the last 168 hours.  Coagulation Studies: No results for input(s): LABPROT, INR in the last 72 hours.   Imaging   ECHOCARDIOGRAM COMPLETE  Result Date: 12/22/2020    ECHOCARDIOGRAM REPORT   Patient Name:   CERINITY ZYNDA Date of Exam: 12/22/2020 Medical Rec #:  [id number]     Height:       67.0 in Accession #:    [id number]  Weight:       160.3 lb Date of Birth:  1971/05/06     BSA:          1.841 m Patient Age:    11 years      BP:           159/101 mmHg Patient Gender:  F             HR:           64 bpm. Exam Location:  Inpatient Procedure: 2D Echo, Cardiac Doppler and Color Doppler Indications:    dyspnea  History:        Patient has prior history of Echocardiogram examinations, most                 recent 01/02/2019. CHF, Signs/Symptoms:Dyspnea; Risk                 Factors:Dyslipidemia and Hypertension.  Sonographer:    Bernadene Person RDCS Referring Phys: [place]  1. Left ventricular ejection fraction, by estimation, is 20 to 25%. The left ventricle has severely decreased function. The left ventricle demonstrates global hypokinesis. The left ventricular internal cavity size was moderately dilated. Left ventricular diastolic parameters are consistent with Grade III diastolic dysfunction (restrictive).  2. Right ventricular systolic function is mildly reduced. The right ventricular size is normal. There is severely elevated pulmonary artery systolic pressure.  3. Left atrial size was severely dilated.  4. The mitral valve is normal in structure. Moderate mitral valve regurgitation. No evidence of mitral stenosis.  5. Tricuspid valve regurgitation is moderate.  6. The aortic valve is tricuspid. Aortic valve regurgitation is not visualized. No aortic stenosis is present.  7. The inferior vena cava is dilated in size with <50% respiratory variability, suggesting right atrial pressure of 15 mmHg. FINDINGS  Left Ventricle: Left ventricular ejection fraction, by estimation, is 20 to 25%. The left ventricle has severely decreased function. The left ventricle demonstrates global hypokinesis. The left ventricular internal cavity size was moderately dilated. There is no left ventricular hypertrophy. Left ventricular diastolic parameters are consistent with Grade III diastolic dysfunction (restrictive). Right Ventricle: The right ventricular size is normal. Right ventricular systolic function is mildly reduced. There is severely elevated pulmonary artery systolic  pressure. The tricuspid regurgitant velocity is 3.37 m/s, and with an assumed right atrial pressure of 15 mmHg, the estimated right ventricular systolic pressure is 92.1 mmHg. Left Atrium: Left atrial size was severely dilated. Right Atrium: Right atrial size was normal in size. Pericardium: Trivial pericardial effusion is present. Mitral Valve: The mitral valve is normal in structure. Moderate mitral valve regurgitation. No evidence of mitral valve stenosis. Tricuspid Valve: The tricuspid valve is normal in structure. Tricuspid valve regurgitation is moderate . No evidence of tricuspid stenosis. Aortic Valve: The aortic valve is tricuspid. Aortic valve regurgitation is not visualized. No aortic stenosis is present. Pulmonic Valve: The pulmonic valve was normal in structure. Pulmonic valve regurgitation is mild. No evidence of pulmonic stenosis. Aorta: The aortic root is normal in size and structure. Venous: The inferior vena cava is dilated in size with less than 50% respiratory variability, suggesting right atrial pressure of 15 mmHg. IAS/Shunts: No atrial level shunt detected by color flow Doppler.  LEFT VENTRICLE PLAX 2D LVIDd:         6.20 cm      Diastology LVIDs:         5.00 cm      LV e' medial:  4.05 cm/s LV PW:         1.00 cm      LV e' lateral: 4.61 cm/s LV IVS:        0.80 cm LVOT diam:     2.00 cm LV SV:         36 LV SV Index:   20 LVOT Area:     3.14 cm  LV Volumes (MOD) LV vol d, MOD A2C: 186.0 ml LV vol d, MOD A4C: 151.0 ml LV vol s, MOD A2C: 132.0 ml LV vol s, MOD A4C: 96.2 ml LV SV MOD A2C:     54.0 ml LV SV MOD A4C:     151.0 ml LV SV MOD BP:      58.3 ml RIGHT VENTRICLE RV S prime:     10.20 cm/s TAPSE (M-mode): 1.4 cm LEFT ATRIUM             Index       RIGHT ATRIUM           Index LA diam:        4.20 cm 2.28 cm/m  RA Area:     13.90 cm LA Vol (A2C):   74.3 ml 40.37 ml/m RA Volume:   28.60 ml  15.54 ml/m LA Vol (A4C):   96.9 ml 52.65 ml/m LA Biplane Vol: 87.7 ml 47.65 ml/m  AORTIC  VALVE LVOT Vmax:   73.60 cm/s LVOT Vmean:  51.600 cm/s LVOT VTI:    0.116 m  AORTA Ao Root diam: 2.80 cm Ao Asc diam:  3.10 cm MR Peak grad:    136.4 mmHg  TRICUSPID VALVE MR Mean grad:    79.0 mmHg   TR Peak grad:   45.4 mmHg MR Vmax:         584.00 cm/s TR Vmax:        337.00 cm/s MR Vmean:        405.0 cm/s MR PISA:         1.01 cm    SHUNTS MR PISA Eff ROA: 7 mm       Systemic VTI:  0.12 m MR PISA Radius:  0.40 cm     Systemic Diam: 2.00 cm Kirk Ruths MD Electronically signed by Kirk Ruths MD Signature Date/Time: 12/22/2020/3:24:48 PM    Final      Medications:     Current Medications:  carvedilol  25 mg Oral BID WC   enoxaparin (LOVENOX) injection  40 mg Subcutaneous Q24H   hydrALAZINE  50 mg Oral Q8H   losartan  100 mg Oral Daily   spironolactone  25 mg Oral Daily    Infusions:    Assessment/Plan   Acute on Chronic Systolic Heart Failure  -NICM. LHC in 2016 showed normal coronaries - Echo 11/2014:  LVEF 50-55%, RV normal  - Echo 02/2015: LVEF 40-45% (felt stress induced) AK of apical myocardium, G2DD, RV normal  - Echo 12/2018: LVEF 40-45%, RV mildly reduced  - EF now 20-25%, RV mildly reduced, in the setting of poorly controlled HTN/ hypertensive emergency  - Good response to IV Lasix, appears well diuresed  - Plan [facility] tomorrow, cMRI if cath unrevealing  - needs better BP control/ titration of GDMT - Add Entresto tomorrow (lisinopril given 6/14). Continue Losartan 100 mg for today  - Spironolactone 25 mg daily  - Coreg 25 mg bid  - Hydralazine 50 mg tid - Add Imdur + SGLT2i next (hgb A1c 5.7)   2. Hypertension  - poorly  controlled in setting of poor med compliance  - restart meds and continue gradual titration (see above) - will need outpatient sleep study to r/o OSA   3. Moderate MR/TR - likely functional in setting of dilated LV/RV   4. Pulmonary HTN - RVSP severely elevated on echo, 60 mHg - suspect primarily 2/2 left sided heart disease/ acute CHF w/  elevated filling pressures - will need RHC after diuresis  - outpatient sleep study   5. Tobacco abuse - smokes 3/4 ppd, smoking cessation advised   Medication concerns reviewed with patient and pharmacy team. Barriers identified: difficulties affording medications, affecting compliance. Will engaged HF SW and pharmacy teams. Will need medication assistance and may benefit from paramedicine.   Length of Stay: [place], PA-C  12/23/2020, 2:01 PM  Advanced Heart Failure Team Pager [phone] (M-F; 7a - 5p)  Please contact [facility] for night-coverage after hours (4p -7a ) and weekends on [email or web address]  Patient seen and examined with the above-signed Advanced Practice Provider and/or Housestaff. I personally reviewed laboratory data, imaging studies and relevant notes. I independently examined the patient and formulated the important aspects of the plan. I have edited the note to reflect any of my changes or salient points. I have personally discussed the plan with the patient and/or family.  50 y/o smoker with h/o severe HTN admitted with acute HF. EF 20%  Has known about her HTN for a long time but didn't have money to afford meds. Over past 1-2 weeks has noticed LE edema, SOB and chest pressure. On admit EF 20% BP 220/137. BNP 1290, Hs trop 35>>34. EKG showed NSR w/ LVH.   Volume status improved with diuresis.  General:  Well appearing. No resp difficulty HEENT: normal Neck: supple. JVP hard to see ~7 Carotids 2+ bilat; no bruits. No lymphadenopathy or thryomegaly appreciated. Cor: PMI nondisplaced. Regular rate & rhythm. No rubs, gallops or murmurs. Lungs: clear Abdomen: soft, nontender, nondistended. No hepatosplenomegaly. No bruits or masses. Good bowel sounds. Extremities: no cyanosis, clubbing, rash, edema Neuro: alert & orientedx3, cranial nerves grossly intact. moves all 4 extremities w/o difficulty. 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   Continue to titrae HF meds with goal SBP 130-150.  Plan R/L cath tomorrow. PharmD team to support efforts.   Will needs sleep study. Consider w/u for secondary causes of severe HTN.    Glori Bickers, MD  4:01 PM

## 2020-12-23 NOTE — TOC Initial Note (Signed)
Transition of Care (TOC) - Initial/Assessment Note  Heart Failure   Patient Details  Name: Tahara Ruffini MRN: [id number] Date of Birth: 12/04/1970  Transition of Care [facility]) CM/SW Contact:    Charles, Hassell Phone Number: 12/23/2020, 4:33 PM  Clinical Narrative:                 CSW spoke with the patient at bedside and completed a very brief SDOH screening with the patient who reported having several needs as she doesn't have health insurance, no transportation, and she lives with a friend/family member. CSW to enroll Ms. Vollman in [employer] and provided her with their card as well to get her to all [facility] related appointments. Ms. Brazier reported previously going to [facility] but she hasn't been in awhile but is agreeable for CSW to schedule her a hospital follow up. CSW obtained the patients signature for the Disability and Food Stamp referral for the [facility] to reach out to them. CSW faxed over the Disability and Food Stamp application and informed the patient that the [facility] will be in touch. CSW provided Ms. Schuur with a CAFA application due to the patient not having health insurance (it is unclear if the patient has Medicaid maybe only Medicaid Family Planning). Ms. Kaleta reported she could use any and all support available. CSW provided the patient with the social workers name and position and to reach out to [place] as other social needs arise.       CSW will continue to follow throughout discharge.    Barriers to Discharge: Continued Medical Work up   Patient Goals and CMS Choice        Expected Discharge Plan and Services   In-house Referral: Clinical Social Work     Living arrangements for the past 2 months: Single Family Home                                      Prior Living Arrangements/Services Living arrangements for the past 2 months: Single Family Home   Patient language and need for interpreter reviewed:: Yes        Need for  Family Participation in Patient Care: No (Comment) Care giver support system in place?: No (comment)   Criminal Activity/Legal Involvement Pertinent to Current Situation/Hospitalization: No - Comment as needed  Activities of Daily Living Home Assistive Devices/Equipment: None ADL Screening (condition at time of admission) Patient's cognitive ability adequate to safely complete daily activities?: Yes Is the patient deaf or have difficulty hearing?: No Does the patient have difficulty seeing, even when wearing glasses/contacts?: No Does the patient have difficulty concentrating, remembering, or making decisions?: No Patient able to express need for assistance with ADLs?: Yes Does the patient have difficulty dressing or bathing?: No Independently performs ADLs?: Yes (appropriate for developmental age) Does the patient have difficulty walking or climbing stairs?: No Weakness of Legs: None Weakness of Arms/Hands: None  Permission Sought/Granted                  Emotional Assessment Appearance:: Appears stated age Attitude/Demeanor/Rapport: Engaged Affect (typically observed): Pleasant Orientation: : Oriented to Self, Oriented to Place, Oriented to  Time, Oriented to Situation   Psych Involvement: No (comment)  Admission diagnosis:  Elevated troponin [R77.8] Hypertensive urgency [I16.0] Hypertensive emergency [I16.1] Patient Active Problem List   Diagnosis Date Noted   Cardiomyopathy ([place]) 12/22/2020   Vitamin D deficiency  02/22/2019   Bilateral impacted cerumen 01/21/2019   Seasonal allergies 01/21/2019   Hypertensive emergency 01/02/2019   Marijuana abuse 01/02/2019   Tobacco dependence 01/02/2019   Hyperglycemia 01/02/2019   Acute on chronic combined systolic and diastolic CHF (congestive heart failure) ([place]) 01/02/2019   Seizure ([place]) 01/02/2019   Adnexal mass 02/18/2015   Elevated troponin 02/14/2015   Empyema lung (HCC)    H/O non-ST elevation myocardial  infarction (NSTEMI)    Substance abuse ([place])    Accelerated hypertension 01/30/2015   Chronic kidney disease 11/28/2014   Hypertension 11/26/2014   Hypertensive urgency 11/11/2014   PCP:  Azzie Glatter, FNP (Inactive) Pharmacy:   [facility] [address] [place] [address] Phone: [phone] Fax: [phone]     Social Determinants of Health (SDOH) Interventions Food Insecurity Interventions: Assist with SNAP Application Financial Strain Interventions: Other (Comment) (servant center referral for food stamp and disability referral to [facility] transportation and outpatient HV clinic for follow up) Housing Interventions: Intervention Not Indicated Transportation Interventions: [employer]  Readmission Risk Interventions No flowsheet data found.  Sherby Moncayo, MSW, [place] Heart Failure Social Worker

## 2020-12-23 NOTE — Plan of Care (Signed)
  Problem: Health Behavior/Discharge Planning: Goal: Ability to manage health-related needs will improve Outcome: Progressing   Problem: Clinical Measurements: Goal: Ability to maintain clinical measurements within normal limits will improve Outcome: Progressing   Problem: Clinical Measurements: Goal: Will remain free from infection Outcome: Progressing   Problem: Clinical Measurements: Goal: Respiratory complications will improve Outcome: Progressing

## 2020-12-23 NOTE — Progress Notes (Signed)
Heart Failure Stewardship Pharmacist Progress Note   PCP: Azzie Glatter, FNP (Inactive) PCP-Cardiologist: None    HPI:  50 yo F with PMH of HTN, prior HTN urgency/emergency, and NSTEMI. She presented to the ED on 6/13 with chest pain and HTN urgency. She has stopped taking her medications a few months prior because she could not afford them. An ECHO was done on 6/14 and LVEF was reduced to 20-25% (previously 40% in 12/2018).  Current HF Medications: Carvedilol 25 mg BID Losartan 100 mg daily Spironolactone 25 mg daily Hydralazine 50 mg TID  Prior to admission HF Medications: None - not taking any medications PTA  Pertinent Lab Values: Serum creatinine 1.12, BUN 19, Potassium 3.9, Sodium 133, BNP 1290.8  Vital Signs: Weight: 157 lbs (admission weight: 160 lbs) Blood pressure: 170/100s  Heart rate: 50s   Medication Assistance / Insurance Benefits Check: Does the patient have prescription insurance?  No  Outpatient Pharmacy:  Prior to admission outpatient pharmacy: [facility] Is the patient willing to use [facility] at discharge? Yes  Assessment: 1. Acute systolic CHF (EF 41-28%), due to medication noncompliance. NYHA class II symptoms. - Continue carvedilol 25 mg BID - Lisinopril discontinued and started on losartan 100 mg daily today. Last dose of lisinopril was 6/14 @ 1130. Consider changing to [facility] tomorrow to optimize HFrEF regimen and for further BP reduction.  - Agree with starting spironolactone 25 mg daily. Can increase to 50 mg daily for resistant HTN if needed. - Continue hydralazine 50 mg TID - Consider starting Imdur 30 mg daily for hydral/nitrate benefit with HFrEF in AA female - Consider starting Farxiga 10 mg daily prior to discharge   Plan: 1) Medication changes recommended at this time: - Agree with changes as above - Can start Imdur 30 mg daily  2) Patient assistance: - Will consult HF CSW to help with insurance/financial  strain - Can enroll in patient assistance to obtain Entresto/Farxiga for free - HF TOC appt made for 6/22  3)  Education  - To be completed prior to discharge  Kerby Nora, PharmD, BCPS Heart Failure Stewardship Pharmacist Phone [phone]

## 2020-12-24 ENCOUNTER — Encounter (HOSPITAL_COMMUNITY): Payer: Self-pay | Admitting: Internal Medicine

## 2020-12-24 ENCOUNTER — Inpatient Hospital Stay (HOSPITAL_COMMUNITY)
Admission: EM | Disposition: A | Discharge status: Home / Self Care | Payer: Self-pay | Source: Home / Self Care | Attending: Family Medicine

## 2020-12-24 ENCOUNTER — Telehealth (HOSPITAL_COMMUNITY): Payer: Self-pay | Admitting: Pharmacy Technician

## 2020-12-24 ENCOUNTER — Other Ambulatory Visit (HOSPITAL_COMMUNITY): Payer: Self-pay

## 2020-12-24 DIAGNOSIS — I2510 Atherosclerotic heart disease of native coronary artery without angina pectoris: Secondary | ICD-10-CM

## 2020-12-24 HISTORY — PX: RIGHT/LEFT HEART CATH AND CORONARY ANGIOGRAPHY: CATH118266

## 2020-12-24 LAB — POCT I-STAT EG7
Acid-Base Excess: 1 mmol/L (ref 0.0–2.0)
Acid-Base Excess: 1 mmol/L (ref 0.0–2.0)
Bicarbonate: 25.8 mmol/L (ref 20.0–28.0)
Bicarbonate: 26.2 mmol/L (ref 20.0–28.0)
Calcium, Ion: 1.16 mmol/L (ref 1.15–1.40)
Calcium, Ion: 1.24 mmol/L (ref 1.15–1.40)
HCT: 39 % (ref 36.0–46.0)
HCT: 41 % (ref 36.0–46.0)
Hemoglobin: 13.3 g/dL (ref 12.0–15.0)
Hemoglobin: 13.9 g/dL (ref 12.0–15.0)
O2 Saturation: 68 %
O2 Saturation: 69 %
Potassium: 3.2 mmol/L — ABNORMAL LOW (ref 3.5–5.1)
Potassium: 3.4 mmol/L — ABNORMAL LOW (ref 3.5–5.1)
Sodium: 140 mmol/L (ref 135–145)
Sodium: 141 mmol/L (ref 135–145)
TCO2: 27 mmol/L (ref 22–32)
TCO2: 27 mmol/L (ref 22–32)
pCO2, Ven: 40.9 mmHg — ABNORMAL LOW (ref 44.0–60.0)
pCO2, Ven: 41.1 mmHg — ABNORMAL LOW (ref 44.0–60.0)
pH, Ven: 7.409 (ref 7.250–7.430)
pH, Ven: 7.412 (ref 7.250–7.430)
pO2, Ven: 35 mmHg (ref 32.0–45.0)
pO2, Ven: 36 mmHg (ref 32.0–45.0)

## 2020-12-24 LAB — CBC
HCT: 41.7 % (ref 36.0–46.0)
Hemoglobin: 13 g/dL (ref 12.0–15.0)
MCH: 26.6 pg (ref 26.0–34.0)
MCHC: 31.2 g/dL (ref 30.0–36.0)
MCV: 85.3 fL (ref 80.0–100.0)
Platelets: 211 10*3/uL (ref 150–400)
RBC: 4.89 MIL/uL (ref 3.87–5.11)
RDW: 15.2 % (ref 11.5–15.5)
WBC: 6 10*3/uL (ref 4.0–10.5)
nRBC: 0 % (ref 0.0–0.2)

## 2020-12-24 LAB — BASIC METABOLIC PANEL
Anion gap: 8 (ref 5–15)
BUN: 16 mg/dL (ref 6–20)
CO2: 25 mmol/L (ref 22–32)
Calcium: 9 mg/dL (ref 8.9–10.3)
Chloride: 103 mmol/L (ref 98–111)
Creatinine, Ser: 0.98 mg/dL (ref 0.44–1.00)
GFR, Estimated: >60 mL/min (ref >60)
Glucose, Bld: 90 mg/dL (ref 70–99)
Potassium: 3.6 mmol/L (ref 3.5–5.1)
Sodium: 136 mmol/L (ref 135–145)

## 2020-12-24 LAB — POCT I-STAT 7, (LYTES, BLD GAS, ICA,H+H)
Acid-base deficit: 1 mmol/L (ref 0.0–2.0)
Bicarbonate: 21.8 mmol/L (ref 20.0–28.0)
Calcium, Ion: 0.97 mmol/L — ABNORMAL LOW (ref 1.15–1.40)
HCT: 36 % (ref 36.0–46.0)
Hemoglobin: 12.2 g/dL (ref 12.0–15.0)
O2 Saturation: 97 %
Potassium: 3 mmol/L — ABNORMAL LOW (ref 3.5–5.1)
Sodium: 144 mmol/L (ref 135–145)
TCO2: 23 mmol/L (ref 22–32)
pCO2 arterial: 29.9 mmHg — ABNORMAL LOW (ref 32.0–48.0)
pH, Arterial: 7.471 — ABNORMAL HIGH (ref 7.350–7.450)
pO2, Arterial: 86 mmHg (ref 83.0–108.0)

## 2020-12-24 LAB — MAGNESIUM: Magnesium: 2 mg/dL (ref 1.7–2.4)

## 2020-12-24 SURGERY — RIGHT/LEFT HEART CATH AND CORONARY ANGIOGRAPHY
Anesthesia: LOCAL

## 2020-12-24 MED ORDER — HEPARIN (PORCINE) IN NACL 1000-0.9 UT/500ML-% IV SOLN
INTRAVENOUS | Status: AC
  Filled 2020-12-24: qty 1000

## 2020-12-24 MED ORDER — VERAPAMIL HCL 2.5 MG/ML IV SOLN
INTRAVENOUS | Status: AC
  Filled 2020-12-24: qty 2

## 2020-12-24 MED ORDER — MIDAZOLAM HCL 2 MG/2ML IJ SOLN
INTRAMUSCULAR | Status: AC
  Filled 2020-12-24: qty 2

## 2020-12-24 MED ORDER — HYDRALAZINE HCL 20 MG/ML IJ SOLN: 10.0000 mg | INTRAMUSCULAR | Status: AC | PRN

## 2020-12-24 MED ORDER — ENOXAPARIN SODIUM 40 MG/0.4ML IJ SOSY
40.0000 mg | PREFILLED_SYRINGE | INTRAMUSCULAR | Status: DC
  Administered 2020-12-25: 40 mg via SUBCUTANEOUS
  Filled 2020-12-24 (×2): qty 0.4

## 2020-12-24 MED ORDER — SODIUM CHLORIDE 0.9 % IV SOLN: INTRAVENOUS | Status: AC

## 2020-12-24 MED ORDER — LIDOCAINE HCL (PF) 1 % IJ SOLN
INTRAMUSCULAR | Status: DC | PRN
  Administered 2020-12-24 (×2): 2 mL

## 2020-12-24 MED ORDER — SACUBITRIL-VALSARTAN 97-103 MG PO TABS
1.0000 | ORAL_TABLET | Freq: Two times a day (BID) | ORAL | Status: DC
  Administered 2020-12-24 – 2020-12-26 (×5): 1 via ORAL
  Filled 2020-12-24 (×5): qty 1

## 2020-12-24 MED ORDER — FENTANYL CITRATE (PF) 100 MCG/2ML IJ SOLN
INTRAMUSCULAR | Status: DC | PRN
  Administered 2020-12-24: 25 ug via INTRAVENOUS

## 2020-12-24 MED ORDER — ACETAMINOPHEN 325 MG PO TABS: 650.0000 mg | ORAL_TABLET | ORAL | Status: DC | PRN

## 2020-12-24 MED ORDER — HEPARIN (PORCINE) IN NACL 1000-0.9 UT/500ML-% IV SOLN
INTRAVENOUS | Status: DC | PRN
  Administered 2020-12-24 (×2): 500 mL

## 2020-12-24 MED ORDER — VERAPAMIL HCL 2.5 MG/ML IV SOLN
INTRAVENOUS | Status: DC | PRN
  Administered 2020-12-24: 10 mL via INTRA_ARTERIAL

## 2020-12-24 MED ORDER — LIDOCAINE HCL (PF) 1 % IJ SOLN
INTRAMUSCULAR | Status: AC
  Filled 2020-12-24: qty 30

## 2020-12-24 MED ORDER — FENTANYL CITRATE (PF) 100 MCG/2ML IJ SOLN
INTRAMUSCULAR | Status: AC
  Filled 2020-12-24: qty 2

## 2020-12-24 MED ORDER — IOHEXOL 350 MG/ML SOLN
INTRAVENOUS | Status: DC | PRN
  Administered 2020-12-24: 45 mL

## 2020-12-24 MED ORDER — SODIUM CHLORIDE 0.9% FLUSH: 3.0000 mL | INTRAVENOUS | Status: DC | PRN

## 2020-12-24 MED ORDER — HEPARIN SODIUM (PORCINE) 1000 UNIT/ML IJ SOLN
INTRAMUSCULAR | Status: DC | PRN
  Administered 2020-12-24: 3500 [IU] via INTRAVENOUS

## 2020-12-24 MED ORDER — MIDAZOLAM HCL 2 MG/2ML IJ SOLN
INTRAMUSCULAR | Status: DC | PRN
  Administered 2020-12-24: 2 mg via INTRAVENOUS

## 2020-12-24 MED ORDER — SODIUM CHLORIDE 0.9% FLUSH
3.0000 mL | Freq: Two times a day (BID) | INTRAVENOUS | Status: DC
  Administered 2020-12-24 – 2020-12-25 (×4): 3 mL via INTRAVENOUS

## 2020-12-24 MED ORDER — POTASSIUM CHLORIDE CRYS ER 20 MEQ PO TBCR
20.0000 meq | EXTENDED_RELEASE_TABLET | Freq: Once | ORAL | Status: AC
  Administered 2020-12-24: 20 meq via ORAL
  Filled 2020-12-24: qty 1

## 2020-12-24 MED ORDER — LABETALOL HCL 5 MG/ML IV SOLN: 10.0000 mg | INTRAVENOUS | Status: AC | PRN

## 2020-12-24 MED ORDER — ONDANSETRON HCL 4 MG/2ML IJ SOLN: 4.0000 mg | Freq: Four times a day (QID) | INTRAMUSCULAR | Status: DC | PRN

## 2020-12-24 MED ORDER — SODIUM CHLORIDE 0.9 % IV SOLN: 250.0000 mL | INTRAVENOUS | Status: DC | PRN

## 2020-12-24 SURGICAL SUPPLY — 11 items
CATH 5FR JL3.5 JR4 ANG PIG MP (CATHETERS) ×1 IMPLANT
CATH BALLN WEDGE 5F 110CM (CATHETERS) ×1 IMPLANT
CATH INFINITI 5FR AL1 (CATHETERS) ×1 IMPLANT
DEVICE RAD COMP TR BAND LRG (VASCULAR PRODUCTS) ×1 IMPLANT
GLIDESHEATH SLEND SS 6F .021 (SHEATH) ×1 IMPLANT
GUIDEWIRE .025 260CM (WIRE) ×1 IMPLANT
GUIDEWIRE INQWIRE 1.5J.035X260 (WIRE) IMPLANT
INQWIRE 1.5J .035X260CM (WIRE) ×2
PACK CARDIAC CATHETERIZATION (CUSTOM PROCEDURE TRAY) ×2 IMPLANT
SHEATH GLIDE SLENDER 4/5FR (SHEATH) ×1 IMPLANT
TRANSDUCER W/STOPCOCK (MISCELLANEOUS) ×2 IMPLANT

## 2020-12-24 NOTE — Progress Notes (Signed)
   12/24/20 0800  Clinical Encounter Type  Visited With Patient  Visit Type Social support;Spiritual support  Referral From Nurse  Consult/Referral To Chaplain  Spiritual Encounters  Spiritual Needs Emotional;Grief support;Prayer  The chaplain responded to spiritual consult to speak to the patient about her nephew's death. The patient found out on social media that her nephew passed. The patient was emotional. The patient was being taken to the CATH Lab for a procedure. The chaplain provided emotional support to the patient. The chaplain provided social support and spiritual care while visiting. The patient asked the chaplain to pray. The chaplain prayed for comfort and a successful surgery. The chaplain will follow up as needed.

## 2020-12-24 NOTE — Interval H&P Note (Signed)
History and Physical Interval Note:  12/24/2020 9:07 AM  Emily Key  has presented today for surgery, with the diagnosis of heart failure.  The various methods of treatment have been discussed with the patient and family. After consideration of risks, benefits and other options for treatment, the patient has consented to  Procedure(s): RIGHT/LEFT HEART CATH AND CORONARY ANGIOGRAPHY (N/A) and possible coronary angioplasty as a surgical intervention.  The patient's history has been reviewed, patient examined, no change in status, stable for surgery.  I have reviewed the patient's chart and labs.  Questions were answered to the patient's satisfaction.     Dempsy Damiano

## 2020-12-24 NOTE — Telephone Encounter (Signed)
Advanced Heart Failure Patient Advocate Encounter  Sent in [employer] and [employer] application via fax.  Will follow up.

## 2020-12-24 NOTE — H&P (View-Only) (Signed)
Advanced Heart Failure Rounding Note   Subjective:     Denies CP or SOB. No orthopnea or PND. BP improving but still high.      Objective:   Weight Range:  Vital Signs:   Temp:  [97.6 F (36.4 C)-98.6 F (37 C)] 98.6 F (37 C) (06/16 0733) Pulse Rate:  [56-66] 66 (06/16 0733) Resp:  [15-19] 18 (06/16 0437) BP: (158-172)/(89-101) 158/101 (06/16 0733) SpO2:  [98 %-100 %] 100 % (06/16 0843) Weight:  [71.2 kg] 71.2 kg (06/16 0437) Last BM Date: 12/18/20  Weight change: Filed Weights   12/22/20 1059 12/23/20 0348 12/24/20 0437  Weight: 72.7 kg 71.3 kg 71.2 kg    Intake/Output:   Intake/Output Summary (Last 24 hours) at 12/24/2020 0903 Last data filed at 12/24/2020 8250 Gross per 24 hour  Intake 480 ml  Output 1250 ml  Net -770 ml     Physical Exam: General:  Well appearing. No resp difficulty HEENT: normal Neck: supple. JVP . Carotids 2+ bilat; no bruits. No lymphadenopathy or thryomegaly appreciated. Cor: PMI nondisplaced. Regular rate & rhythm. No rubs, gallops or murmurs. Lungs: clear Abdomen: soft, nontender, nondistended. No hepatosplenomegaly. No bruits or masses. Good bowel sounds. Extremities: no cyanosis, clubbing, rash, edema Neuro: alert & orientedx3, cranial nerves grossly intact. moves all 4 extremities w/o difficulty. Affect pleasant  Telemetry: NSR 60-70s Personally reviewed   Labs: Basic Metabolic Panel: Recent Labs  Lab 12/21/20 1647 12/22/20 0433 12/23/20 0752 12/24/20 0357  NA 138 138 133* 136  K 3.2* 3.7 3.9 3.6  CL 104 106 102 103  CO2 23 24 26 25   GLUCOSE 104* 112* 92 90  BUN 22* 21* 19 16  CREATININE 1.15* 1.09* 1.12* 0.98  CALCIUM 9.1 9.0 8.8* 9.0  MG  --   --   --  2.0    Liver Function Tests: Recent Labs  Lab 12/21/20 1647  AST 25  ALT 21  ALKPHOS 61  BILITOT 1.3*  PROT 6.5  ALBUMIN 3.6   Recent Labs  Lab 12/21/20 1647  LIPASE 20   No results for input(s): AMMONIA in the last 168 hours.  CBC: Recent  Labs  Lab 12/21/20 1647 12/22/20 0433  WBC 7.2 6.4  NEUTROABS 5.4  --   HGB 13.7 12.7  HCT 42.1 39.4  MCV 82.9 84.0  PLT 229 207    Cardiac Enzymes: No results for input(s): CKTOTAL, CKMB, CKMBINDEX, TROPONINI in the last 168 hours.  BNP: BNP (last 3 results) Recent Labs    12/21/20 1648  BNP 1,290.8*    ProBNP (last 3 results) No results for input(s): PROBNP in the last 8760 hours.    Other results:  Imaging: ECHOCARDIOGRAM COMPLETE  Result Date: 12/22/2020    ECHOCARDIOGRAM REPORT   Patient Name:   JAHNAVI MURATORE Date of Exam: 12/22/2020 Medical Rec #:  [id number]     Height:       67.0 in Accession #:    [id number]    Weight:       160.3 lb Date of Birth:  Feb 26, 1971     BSA:          1.841 m Patient Age:    50 years      BP:           159/101 mmHg Patient Gender: F             HR:           64 bpm. Exam Location:  Inpatient  Procedure: 2D Echo, Cardiac Doppler and Color Doppler Indications:    dyspnea  History:        Patient has prior history of Echocardiogram examinations, most                 recent 01/02/2019. CHF, Signs/Symptoms:Dyspnea; Risk                 Factors:Dyslipidemia and Hypertension.  Sonographer:    Bernadene Person RDCS Referring Phys: [place]  1. Left ventricular ejection fraction, by estimation, is 20 to 25%. The left ventricle has severely decreased function. The left ventricle demonstrates global hypokinesis. The left ventricular internal cavity size was moderately dilated. Left ventricular diastolic parameters are consistent with Grade III diastolic dysfunction (restrictive).  2. Right ventricular systolic function is mildly reduced. The right ventricular size is normal. There is severely elevated pulmonary artery systolic pressure.  3. Left atrial size was severely dilated.  4. The mitral valve is normal in structure. Moderate mitral valve regurgitation. No evidence of mitral stenosis.  5. Tricuspid valve regurgitation is moderate.  6.  The aortic valve is tricuspid. Aortic valve regurgitation is not visualized. No aortic stenosis is present.  7. The inferior vena cava is dilated in size with <50% respiratory variability, suggesting right atrial pressure of 15 mmHg. FINDINGS  Left Ventricle: Left ventricular ejection fraction, by estimation, is 20 to 25%. The left ventricle has severely decreased function. The left ventricle demonstrates global hypokinesis. The left ventricular internal cavity size was moderately dilated. There is no left ventricular hypertrophy. Left ventricular diastolic parameters are consistent with Grade III diastolic dysfunction (restrictive). Right Ventricle: The right ventricular size is normal. Right ventricular systolic function is mildly reduced. There is severely elevated pulmonary artery systolic pressure. The tricuspid regurgitant velocity is 3.37 m/s, and with an assumed right atrial pressure of 15 mmHg, the estimated right ventricular systolic pressure is 71.2 mmHg. Left Atrium: Left atrial size was severely dilated. Right Atrium: Right atrial size was normal in size. Pericardium: Trivial pericardial effusion is present. Mitral Valve: The mitral valve is normal in structure. Moderate mitral valve regurgitation. No evidence of mitral valve stenosis. Tricuspid Valve: The tricuspid valve is normal in structure. Tricuspid valve regurgitation is moderate . No evidence of tricuspid stenosis. Aortic Valve: The aortic valve is tricuspid. Aortic valve regurgitation is not visualized. No aortic stenosis is present. Pulmonic Valve: The pulmonic valve was normal in structure. Pulmonic valve regurgitation is mild. No evidence of pulmonic stenosis. Aorta: The aortic root is normal in size and structure. Venous: The inferior vena cava is dilated in size with less than 50% respiratory variability, suggesting right atrial pressure of 15 mmHg. IAS/Shunts: No atrial level shunt detected by color flow Doppler.  LEFT VENTRICLE PLAX 2D  LVIDd:         6.20 cm      Diastology LVIDs:         5.00 cm      LV e' medial:  4.05 cm/s LV PW:         1.00 cm      LV e' lateral: 4.61 cm/s LV IVS:        0.80 cm LVOT diam:     2.00 cm LV SV:         36 LV SV Index:   20 LVOT Area:     3.14 cm  LV Volumes (MOD) LV vol d, MOD A2C: 186.0 ml LV vol d, MOD A4C: 151.0 ml LV  vol s, MOD A2C: 132.0 ml LV vol s, MOD A4C: 96.2 ml LV SV MOD A2C:     54.0 ml LV SV MOD A4C:     151.0 ml LV SV MOD BP:      58.3 ml RIGHT VENTRICLE RV S prime:     10.20 cm/s TAPSE (M-mode): 1.4 cm LEFT ATRIUM             Index       RIGHT ATRIUM           Index LA diam:        4.20 cm 2.28 cm/m  RA Area:     13.90 cm LA Vol (A2C):   74.3 ml 40.37 ml/m RA Volume:   28.60 ml  15.54 ml/m LA Vol (A4C):   96.9 ml 52.65 ml/m LA Biplane Vol: 87.7 ml 47.65 ml/m  AORTIC VALVE LVOT Vmax:   73.60 cm/s LVOT Vmean:  51.600 cm/s LVOT VTI:    0.116 m  AORTA Ao Root diam: 2.80 cm Ao Asc diam:  3.10 cm MR Peak grad:    136.4 mmHg  TRICUSPID VALVE MR Mean grad:    79.0 mmHg   TR Peak grad:   45.4 mmHg MR Vmax:         584.00 cm/s TR Vmax:        337.00 cm/s MR Vmean:        405.0 cm/s MR PISA:         1.01 cm    SHUNTS MR PISA Eff ROA: 7 mm       Systemic VTI:  0.12 m MR PISA Radius:  0.40 cm     Systemic Diam: 2.00 cm Kirk Ruths MD Electronically signed by Kirk Ruths MD Signature Date/Time: 12/22/2020/3:24:48 PM    Final      Medications:     Scheduled Medications:  [MAR Hold] carvedilol  25 mg Oral BID WC   [MAR Hold] enoxaparin (LOVENOX) injection  40 mg Subcutaneous Q24H   [MAR Hold] hydrALAZINE  50 mg Oral Q8H   [MAR Hold] losartan  100 mg Oral Daily   [MAR Hold] sodium chloride flush  3 mL Intravenous Q12H   [MAR Hold] spironolactone  25 mg Oral Daily    Infusions:  sodium chloride     sodium chloride 10 mL/hr at 12/24/20 0608    PRN Medications: sodium chloride, [MAR Hold] acetaminophen **OR** [MAR Hold] acetaminophen, Heparin (Porcine) in NaCl, [MAR Hold] labetalol,  [MAR Hold] nitroGLYCERIN, [MAR Hold] ondansetron **OR** [MAR Hold] ondansetron (ZOFRAN) IV, [MAR Hold] polyethylene glycol, sodium chloride flush, [MAR Hold] zolpidem   Assessment/Plan      Acute on Chronic Systolic Heart Failure with chest pressure  -NICM. LHC in 2016 showed normal coronaries - Echo 11/2014:  LVEF 50-55%, RV normal - Echo 02/2015: LVEF 40-45% (felt stress induced) AK of apical myocardium, G2DD, RV normal - Echo 12/2018: LVEF 40-45%, RV mildly reduced - EF now 20-25%, RV mildly reduced, in the setting of poorly controlled HTN/ hypertensive emergency  - Good response to IV Lasix, appears well diuresed - Plan R/LHC today, cMRI if cath unrevealing  - Continue titration of GDMT  - Switch losartan to [facility] after cath - Spironolactone 25 mg daily - Coreg 25 mg bid - Hydralazine 50 mg tid + Imdur - SGLT2i next (hgb A1c 5.7)   2. Hypertension - poorly controlled in setting of poor med compliance -continue to titrate with goal SBP 130-150 - will need outpatient sleep study  to r/o OSA - PharmD to help with med access   3. Moderate MR/TR - likely functional in setting of dilated LV/RV   4. Pulmonary HTN - RVSP severely elevated on echo, 60 mHg - suspect primarily 2/2 left sided heart disease/ acute CHF w/ elevated filling pressures - RHC today - outpatient sleep study    5. Tobacco abuse - smokes 3/4 ppd, smoking cessation advised     Length of Stay: 2   Glori Bickers MD 12/24/2020, 9:03 AM  Advanced Heart Failure Team Pager [phone] (M-F; 7a - 4p)  Please contact [facility] for night-coverage after hours (4p -7a ) and weekends on [email or web address]

## 2020-12-24 NOTE — Progress Notes (Signed)
[facility] PROGRESS NOTE    Emily Key  GYF:[id number] DOB: 11/29/1970 DOA: 12/21/2020 PCP: Azzie Glatter, FNP (Inactive)      Brief Narrative:  Emily Key is a 50 y.o. F with hx HTN poorly controlled, sCHF last EF 40s, hx empyema s/p VATS in 2015, and smoking who presented with exertional CP and SOB for several weeks progressive.  In the ER, BP 220/140, BNP 1200 and CXR with CM.           Assessment & Plan:  Acute on chronic diastolic systolic CHF Echo obtained yesterday showed EF down to 20-25%, grade III DD, and PASP estimated very high.  Cardiology consulted.  Again only net negative a little bit creatinine and potassium stable - Continue IV furosemide - Supplement potassium - Continue spironolactone, carvedilol, hydralazine -STart Entresto -Consult Cardiololgy, patient underwent left and right heart cath today    Hypertensive urgency Blood pressure still elevated - Continue spironolactone,  carvedilol, hydralazine, Entresto -Check a.m. aldosterone and PRA - Check renal duplex  Smoking Cessation recommended  Hypokalemia Repleted, resolved  Low sodium Trivial, asymptomatic, not hyponatemia           Disposition: Status is: Inpatient  Remains inpatient appropriate because:Ongoing diagnostic testing needed not appropriate for outpatient work up  Dispo: The patient is from: Home              Anticipated d/c is to: Home              Patient currently is not medically stable to d/c.   Difficult to place patient No       Level of care: Telemetry Cardiac       MDM: The below labs and imaging reports were reviewed and summarized above.  Medication management as above.  This is a severe exacberation of her chronic disease     DVT prophylaxis: enoxaparin (LOVENOX) injection 40 mg Start: 12/25/20 0800  Code Status:  FULL Family Communication: Son at bedside             Subjective: Still with  orthopnea and some dyspnea, no cough or confusion or fever.  Objective: Vitals:   12/24/20 0941 12/24/20 0946 12/24/20 1118 12/24/20 1705  BP:   (!) 163/89 (!) 152/96  Pulse: 77 77 63 63  Resp: 12 12  19   Temp:    98.2 F (36.8 C)  TempSrc:    Oral  SpO2: 95% (!) 0% 99% 100%  Weight:      Height:        Intake/Output Summary (Last 24 hours) at 12/24/2020 1851 Last data filed at 12/24/2020 1824 Gross per 24 hour  Intake 491 ml  Output 1500 ml  Net -1009 ml   Filed Weights   12/22/20 1059 12/23/20 0348 12/24/20 0437  Weight: 72.7 kg 71.3 kg 71.2 kg    Examination: General appearance: Thin adult female, alert and in no acute distress.  Lying in bed. HEENT: Anicteric, conjunctiva pink, lids and lashes normal. No nasal deformity, discharge, epistaxis.  Lips moist, dentition in good repair, OP moist, no oral lesions.   Skin:   Cardiac: RRR, nl S1-S2, S4 noted.  Capillary refill is brisk.  JVP only 1-2cm above clavicle.  No LE edema.  Radial pulses 2+ and symmetric. Respiratory: Normal respiratory rate and rhythm.  CTAB without rales or wheezes. Abdomen: Abdomen soft.  Moderate diffuse TTP, no rebound, mild guarding. No ascites, distension, hepatosplenomegaly.   MSK: No deformities or effusions. Neuro: Awake  and alert.  EOMI, moves all extremities. Speech fluent.    Psych: Sensorium intact and responding to questions, attention normal. Affect normal.  Judgment and insight appear normal.    Data Reviewed: I have personally reviewed following labs and imaging studies:  CBC: Recent Labs  Lab 12/21/20 1647 12/22/20 0433 12/24/20 0917 12/24/20 0923 12/24/20 1006  WBC 7.2 6.4  --   --  6.0  NEUTROABS 5.4  --   --   --   --   HGB 13.7 12.7 12.2 13.9  13.3 13.0  HCT 42.1 39.4 36.0 41.0  39.0 41.7  MCV 82.9 84.0  --   --  85.3  PLT 229 207  --   --  283   Basic Metabolic Panel: Recent Labs  Lab 12/21/20 1647 12/22/20 0433 12/23/20 0752 12/24/20 0357 12/24/20 0917  12/24/20 0923  NA 138 138 133* 136 144 140  141  K 3.2* 3.7 3.9 3.6 3.0* 3.4*  3.2*  CL 104 106 102 103  --   --   CO2 23 24 26 25   --   --   GLUCOSE 104* 112* 92 90  --   --   BUN 22* 21* 19 16  --   --   CREATININE 1.15* 1.09* 1.12* 0.98  --   --   CALCIUM 9.1 9.0 8.8* 9.0  --   --   MG  --   --   --  2.0  --   --    GFR: Estimated Creatinine Clearance: 66 mL/min (by C-G formula based on SCr of 0.98 mg/dL). Liver Function Tests: Recent Labs  Lab 12/21/20 1647  AST 25  ALT 21  ALKPHOS 61  BILITOT 1.3*  PROT 6.5  ALBUMIN 3.6   Recent Labs  Lab 12/21/20 1647  LIPASE 20   No results for input(s): AMMONIA in the last 168 hours. Coagulation Profile: No results for input(s): INR, PROTIME in the last 168 hours. Cardiac Enzymes: No results for input(s): CKTOTAL, CKMB, CKMBINDEX, TROPONINI in the last 168 hours. BNP (last 3 results) No results for input(s): PROBNP in the last 8760 hours. HbA1C: No results for input(s): HGBA1C in the last 72 hours. CBG: No results for input(s): GLUCAP in the last 168 hours. Lipid Profile: No results for input(s): CHOL, HDL, LDLCALC, TRIG, CHOLHDL, LDLDIRECT in the last 72 hours. Thyroid Function Tests: No results for input(s): TSH, T4TOTAL, FREET4, T3FREE, THYROIDAB in the last 72 hours. Anemia Panel: No results for input(s): VITAMINB12, FOLATE, FERRITIN, TIBC, IRON, RETICCTPCT in the last 72 hours. Urine analysis:    Component Value Date/Time   COLORURINE YELLOW 01/02/2019 0354   APPEARANCEUR CLEAR 01/02/2019 0354   LABSPEC 1.012 01/02/2019 0354   PHURINE 6.0 01/02/2019 0354   GLUCOSEU 150 (A) 01/02/2019 0354   HGBUR SMALL (A) 01/02/2019 0354   BILIRUBINUR negative 02/22/2019 1405   KETONESUR negative 02/22/2019 1405   KETONESUR NEGATIVE 01/02/2019 0354   PROTEINUR negative 02/22/2019 1405   PROTEINUR >=300 (A) 01/02/2019 0354   UROBILINOGEN 0.2 02/22/2019 1405   UROBILINOGEN 0.2 02/09/2015 1242   NITRITE Negative 02/22/2019  1405   NITRITE NEGATIVE 01/02/2019 0354   LEUKOCYTESUR Trace (A) 02/22/2019 1405   LEUKOCYTESUR NEGATIVE 01/02/2019 0354   Sepsis Labs: @LABRCNTIP (procalcitonin:4,lacticacidven:4)  ) Recent Results (from the past 240 hour(s))  SARS CORONAVIRUS 2 (TAT 6-24 HRS) Nasopharyngeal Nasopharyngeal Swab     Status: None   Collection Time: 12/23/20  7:09 AM   Specimen: Nasopharyngeal Swab  Result Value  Ref Range Status   SARS Coronavirus 2 NEGATIVE NEGATIVE Final    Comment: (NOTE) SARS-CoV-2 target nucleic acids are NOT DETECTED.  The SARS-CoV-2 RNA is generally detectable in upper and lower respiratory specimens during the acute phase of infection. Negative results do not preclude SARS-CoV-2 infection, do not rule out co-infections with other pathogens, and should not be used as the sole basis for treatment or other patient management decisions. Negative results must be combined with clinical observations, patient history, and epidemiological information. The expected result is Negative.  Fact Sheet for Patients: SugarRoll.be  Fact Sheet for Healthcare Providers: https://www.woods-mathews.com/  This test is not yet approved or cleared by the [place] FDA and  has been authorized for detection and/or diagnosis of SARS-CoV-2 by FDA under an Emergency Use Authorization (EUA). This EUA will remain  in effect (meaning this test can be used) for the duration of the COVID-19 declaration under Se ction 564(b)(1) of the Act, 21 U.S.C. section 360bbb-3(b)(1), unless the authorization is terminated or revoked sooner.  Performed at [facility], [place] [address], [place], [place] [address]          Radiology Studies: CARDIAC CATHETERIZATION  Result Date: 12/24/2020  Prox LAD lesion is 45% stenosed.  Findings: Ao = 177/94 (126) LV = 176/18 RA = 5 RV = 64/9 PA = 64/17 (35) PCW = 18 Fick cardiac output/index = 5.0/2.8 PVR = 3.4 WU Ao sat  = 97% PA sat = 68%, 69% Assessment: 1. Mild CAD 2. Severe NICM likely due to HTN CM 3. 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 Left-sided pressures relatively well compemsated. Plan/Discussion: Medical therapy. Glori Bickers, MD 9:45 AM        Scheduled Meds:  carvedilol  25 mg Oral BID WC   [START ON 12/25/2020] enoxaparin (LOVENOX) injection  40 mg Subcutaneous Q24H   hydrALAZINE  50 mg Oral Q8H   sacubitril-valsartan  1 tablet Oral BID   sodium chloride flush  3 mL Intravenous Q12H   sodium chloride flush  3 mL Intravenous Q12H   spironolactone  25 mg Oral Daily   Continuous Infusions:  sodium chloride       LOS: 2 days    Time spent: 25  minutes    Edwin Dada, MD [facility] 12/24/2020, 6:51 PM     Please page though [facility] or Epic secure chat:  For [employer], Adult nurse

## 2020-12-24 NOTE — Progress Notes (Signed)
Advanced Heart Failure Rounding Note   Subjective:     Denies CP or SOB. No orthopnea or PND. BP improving but still high.      Objective:   Weight Range:  Vital Signs:   Temp:  [97.6 F (36.4 C)-98.6 F (37 C)] 98.6 F (37 C) (06/16 0733) Pulse Rate:  [56-66] 66 (06/16 0733) Resp:  [15-19] 18 (06/16 0437) BP: (158-172)/(89-101) 158/101 (06/16 0733) SpO2:  [98 %-100 %] 100 % (06/16 0843) Weight:  [71.2 kg] 71.2 kg (06/16 0437) Last BM Date: 12/18/20  Weight change: Filed Weights   12/22/20 1059 12/23/20 0348 12/24/20 0437  Weight: 72.7 kg 71.3 kg 71.2 kg    Intake/Output:   Intake/Output Summary (Last 24 hours) at 12/24/2020 0903 Last data filed at 12/24/2020 1025 Gross per 24 hour  Intake 480 ml  Output 1250 ml  Net -770 ml     Physical Exam: General:  Well appearing. No resp difficulty HEENT: normal Neck: supple. JVP . Carotids 2+ bilat; no bruits. No lymphadenopathy or thryomegaly appreciated. Cor: PMI nondisplaced. Regular rate & rhythm. No rubs, gallops or murmurs. Lungs: clear Abdomen: soft, nontender, nondistended. No hepatosplenomegaly. No bruits or masses. Good bowel sounds. Extremities: no cyanosis, clubbing, rash, edema Neuro: alert & orientedx3, cranial nerves grossly intact. moves all 4 extremities w/o difficulty. Affect pleasant  Telemetry: NSR 60-70s Personally reviewed   Labs: Basic Metabolic Panel: Recent Labs  Lab 12/21/20 1647 12/22/20 0433 12/23/20 0752 12/24/20 0357  NA 138 138 133* 136  K 3.2* 3.7 3.9 3.6  CL 104 106 102 103  CO2 23 24 26 25   GLUCOSE 104* 112* 92 90  BUN 22* 21* 19 16  CREATININE 1.15* 1.09* 1.12* 0.98  CALCIUM 9.1 9.0 8.8* 9.0  MG  --   --   --  2.0    Liver Function Tests: Recent Labs  Lab 12/21/20 1647  AST 25  ALT 21  ALKPHOS 61  BILITOT 1.3*  PROT 6.5  ALBUMIN 3.6   Recent Labs  Lab 12/21/20 1647  LIPASE 20   No results for input(s): AMMONIA in the last 168 hours.  CBC: Recent  Labs  Lab 12/21/20 1647 12/22/20 0433  WBC 7.2 6.4  NEUTROABS 5.4  --   HGB 13.7 12.7  HCT 42.1 39.4  MCV 82.9 84.0  PLT 229 207    Cardiac Enzymes: No results for input(s): CKTOTAL, CKMB, CKMBINDEX, TROPONINI in the last 168 hours.  BNP: BNP (last 3 results) Recent Labs    12/21/20 1648  BNP 1,290.8*    ProBNP (last 3 results) No results for input(s): PROBNP in the last 8760 hours.    Other results:  Imaging: ECHOCARDIOGRAM COMPLETE  Result Date: 12/22/2020    ECHOCARDIOGRAM REPORT   Patient Name:   MY RINKE Date of Exam: 12/22/2020 Medical Rec #:  [id number]     Height:       67.0 in Accession #:    [id number]    Weight:       160.3 lb Date of Birth:  10-03-1970     BSA:          1.841 m Patient Age:    50 years      BP:           159/101 mmHg Patient Gender: F             HR:           64 bpm. Exam Location:  Inpatient  Procedure: 2D Echo, Cardiac Doppler and Color Doppler Indications:    dyspnea  History:        Patient has prior history of Echocardiogram examinations, most                 recent 01/02/2019. CHF, Signs/Symptoms:Dyspnea; Risk                 Factors:Dyslipidemia and Hypertension.  Sonographer:    Bernadene Person RDCS Referring Phys: [place]  1. Left ventricular ejection fraction, by estimation, is 20 to 25%. The left ventricle has severely decreased function. The left ventricle demonstrates global hypokinesis. The left ventricular internal cavity size was moderately dilated. Left ventricular diastolic parameters are consistent with Grade III diastolic dysfunction (restrictive).  2. Right ventricular systolic function is mildly reduced. The right ventricular size is normal. There is severely elevated pulmonary artery systolic pressure.  3. Left atrial size was severely dilated.  4. The mitral valve is normal in structure. Moderate mitral valve regurgitation. No evidence of mitral stenosis.  5. Tricuspid valve regurgitation is moderate.  6.  The aortic valve is tricuspid. Aortic valve regurgitation is not visualized. No aortic stenosis is present.  7. The inferior vena cava is dilated in size with <50% respiratory variability, suggesting right atrial pressure of 15 mmHg. FINDINGS  Left Ventricle: Left ventricular ejection fraction, by estimation, is 20 to 25%. The left ventricle has severely decreased function. The left ventricle demonstrates global hypokinesis. The left ventricular internal cavity size was moderately dilated. There is no left ventricular hypertrophy. Left ventricular diastolic parameters are consistent with Grade III diastolic dysfunction (restrictive). Right Ventricle: The right ventricular size is normal. Right ventricular systolic function is mildly reduced. There is severely elevated pulmonary artery systolic pressure. The tricuspid regurgitant velocity is 3.37 m/s, and with an assumed right atrial pressure of 15 mmHg, the estimated right ventricular systolic pressure is 22.9 mmHg. Left Atrium: Left atrial size was severely dilated. Right Atrium: Right atrial size was normal in size. Pericardium: Trivial pericardial effusion is present. Mitral Valve: The mitral valve is normal in structure. Moderate mitral valve regurgitation. No evidence of mitral valve stenosis. Tricuspid Valve: The tricuspid valve is normal in structure. Tricuspid valve regurgitation is moderate . No evidence of tricuspid stenosis. Aortic Valve: The aortic valve is tricuspid. Aortic valve regurgitation is not visualized. No aortic stenosis is present. Pulmonic Valve: The pulmonic valve was normal in structure. Pulmonic valve regurgitation is mild. No evidence of pulmonic stenosis. Aorta: The aortic root is normal in size and structure. Venous: The inferior vena cava is dilated in size with less than 50% respiratory variability, suggesting right atrial pressure of 15 mmHg. IAS/Shunts: No atrial level shunt detected by color flow Doppler.  LEFT VENTRICLE PLAX 2D  LVIDd:         6.20 cm      Diastology LVIDs:         5.00 cm      LV e' medial:  4.05 cm/s LV PW:         1.00 cm      LV e' lateral: 4.61 cm/s LV IVS:        0.80 cm LVOT diam:     2.00 cm LV SV:         36 LV SV Index:   20 LVOT Area:     3.14 cm  LV Volumes (MOD) LV vol d, MOD A2C: 186.0 ml LV vol d, MOD A4C: 151.0 ml LV  vol s, MOD A2C: 132.0 ml LV vol s, MOD A4C: 96.2 ml LV SV MOD A2C:     54.0 ml LV SV MOD A4C:     151.0 ml LV SV MOD BP:      58.3 ml RIGHT VENTRICLE RV S prime:     10.20 cm/s TAPSE (M-mode): 1.4 cm LEFT ATRIUM             Index       RIGHT ATRIUM           Index LA diam:        4.20 cm 2.28 cm/m  RA Area:     13.90 cm LA Vol (A2C):   74.3 ml 40.37 ml/m RA Volume:   28.60 ml  15.54 ml/m LA Vol (A4C):   96.9 ml 52.65 ml/m LA Biplane Vol: 87.7 ml 47.65 ml/m  AORTIC VALVE LVOT Vmax:   73.60 cm/s LVOT Vmean:  51.600 cm/s LVOT VTI:    0.116 m  AORTA Ao Root diam: 2.80 cm Ao Asc diam:  3.10 cm MR Peak grad:    136.4 mmHg  TRICUSPID VALVE MR Mean grad:    79.0 mmHg   TR Peak grad:   45.4 mmHg MR Vmax:         584.00 cm/s TR Vmax:        337.00 cm/s MR Vmean:        405.0 cm/s MR PISA:         1.01 cm    SHUNTS MR PISA Eff ROA: 7 mm       Systemic VTI:  0.12 m MR PISA Radius:  0.40 cm     Systemic Diam: 2.00 cm Kirk Ruths MD Electronically signed by Kirk Ruths MD Signature Date/Time: 12/22/2020/3:24:48 PM    Final      Medications:     Scheduled Medications:  [MAR Hold] carvedilol  25 mg Oral BID WC   [MAR Hold] enoxaparin (LOVENOX) injection  40 mg Subcutaneous Q24H   [MAR Hold] hydrALAZINE  50 mg Oral Q8H   [MAR Hold] losartan  100 mg Oral Daily   [MAR Hold] sodium chloride flush  3 mL Intravenous Q12H   [MAR Hold] spironolactone  25 mg Oral Daily    Infusions:  sodium chloride     sodium chloride 10 mL/hr at 12/24/20 0608    PRN Medications: sodium chloride, [MAR Hold] acetaminophen **OR** [MAR Hold] acetaminophen, Heparin (Porcine) in NaCl, [MAR Hold] labetalol,  [MAR Hold] nitroGLYCERIN, [MAR Hold] ondansetron **OR** [MAR Hold] ondansetron (ZOFRAN) IV, [MAR Hold] polyethylene glycol, sodium chloride flush, [MAR Hold] zolpidem   Assessment/Plan      Acute on Chronic Systolic Heart Failure with chest pressure  -NICM. LHC in 2016 showed normal coronaries - Echo 11/2014:  LVEF 50-55%, RV normal - Echo 02/2015: LVEF 40-45% (felt stress induced) AK of apical myocardium, G2DD, RV normal - Echo 12/2018: LVEF 40-45%, RV mildly reduced - EF now 20-25%, RV mildly reduced, in the setting of poorly controlled HTN/ hypertensive emergency  - Good response to IV Lasix, appears well diuresed - Plan R/LHC today, cMRI if cath unrevealing  - Continue titration of GDMT  - Switch losartan to [facility] after cath - Spironolactone 25 mg daily - Coreg 25 mg bid - Hydralazine 50 mg tid + Imdur - SGLT2i next (hgb A1c 5.7)   2. Hypertension - poorly controlled in setting of poor med compliance -continue to titrate with goal SBP 130-150 - will need outpatient sleep study  to r/o OSA - PharmD to help with med access   3. Moderate MR/TR - likely functional in setting of dilated LV/RV   4. Pulmonary HTN - RVSP severely elevated on echo, 60 mHg - suspect primarily 2/2 left sided heart disease/ acute CHF w/ elevated filling pressures - RHC today - outpatient sleep study    5. Tobacco abuse - smokes 3/4 ppd, smoking cessation advised     Length of Stay: 2   Glori Bickers MD 12/24/2020, 9:03 AM  Advanced Heart Failure Team Pager [phone] (M-F; 7a - 4p)  Please contact [facility] for night-coverage after hours (4p -7a ) and weekends on [email or web address]

## 2020-12-25 ENCOUNTER — Other Ambulatory Visit (HOSPITAL_COMMUNITY): Payer: Self-pay

## 2020-12-25 ENCOUNTER — Inpatient Hospital Stay (HOSPITAL_COMMUNITY): Payer: Medicaid Other

## 2020-12-25 DIAGNOSIS — I161 Hypertensive emergency: Secondary | ICD-10-CM

## 2020-12-25 DIAGNOSIS — I5023 Acute on chronic systolic (congestive) heart failure: Secondary | ICD-10-CM

## 2020-12-25 LAB — BASIC METABOLIC PANEL
Anion gap: 8 (ref 5–15)
BUN: 13 mg/dL (ref 6–20)
CO2: 26 mmol/L (ref 22–32)
Calcium: 9.2 mg/dL (ref 8.9–10.3)
Chloride: 101 mmol/L (ref 98–111)
Creatinine, Ser: 0.93 mg/dL (ref 0.44–1.00)
GFR, Estimated: >60 mL/min (ref >60)
Glucose, Bld: 116 mg/dL — ABNORMAL HIGH (ref 70–99)
Potassium: 3.9 mmol/L (ref 3.5–5.1)
Sodium: 135 mmol/L (ref 135–145)

## 2020-12-25 MED ORDER — HYDRALAZINE HCL 50 MG PO TABS
50.0000 mg | ORAL_TABLET | Freq: Three times a day (TID) | ORAL | 5 refills | Status: DC
  Filled 2020-12-25: qty 90, 30d supply, fill #0

## 2020-12-25 MED ORDER — CARVEDILOL 25 MG PO TABS
25.0000 mg | ORAL_TABLET | Freq: Two times a day (BID) | ORAL | 5 refills | Status: DC
  Filled 2020-12-25: qty 60, 30d supply, fill #0

## 2020-12-25 MED ORDER — SACUBITRIL-VALSARTAN 97-103 MG PO TABS
1.0000 | ORAL_TABLET | Freq: Two times a day (BID) | ORAL | 5 refills | Status: DC
  Filled 2020-12-25: qty 60, 30d supply, fill #0

## 2020-12-25 MED ORDER — ISOSORBIDE MONONITRATE ER 30 MG PO TB24
30.0000 mg | ORAL_TABLET | Freq: Every day | ORAL | Status: DC
  Administered 2020-12-25 – 2020-12-26 (×2): 30 mg via ORAL
  Filled 2020-12-25 (×2): qty 1

## 2020-12-25 MED ORDER — GADOBUTROL 1 MMOL/ML IV SOLN
6.0000 mL | Freq: Once | INTRAVENOUS | Status: AC | PRN
  Administered 2020-12-25: 6 mL via INTRAVENOUS

## 2020-12-25 MED ORDER — MAGNESIUM HYDROXIDE 400 MG/5ML PO SUSP
30.0000 mL | Freq: Once | ORAL | Status: AC
  Administered 2020-12-25: 30 mL via ORAL
  Filled 2020-12-25: qty 30

## 2020-12-25 MED ORDER — DAPAGLIFLOZIN PROPANEDIOL 10 MG PO TABS
10.0000 mg | ORAL_TABLET | Freq: Every day | ORAL | 5 refills | Status: DC
  Filled 2020-12-25: qty 30, 30d supply, fill #0

## 2020-12-25 MED ORDER — SPIRONOLACTONE 25 MG PO TABS
25.0000 mg | ORAL_TABLET | Freq: Every day | ORAL | 5 refills | Status: DC
  Filled 2020-12-25: qty 30, 30d supply, fill #0

## 2020-12-25 MED ORDER — DAPAGLIFLOZIN PROPANEDIOL 10 MG PO TABS
10.0000 mg | ORAL_TABLET | Freq: Every day | ORAL | Status: DC
  Administered 2020-12-25 – 2020-12-26 (×2): 10 mg via ORAL
  Filled 2020-12-25 (×2): qty 1

## 2020-12-25 MED ORDER — ISOSORBIDE MONONITRATE ER 30 MG PO TB24
30.0000 mg | ORAL_TABLET | Freq: Every day | ORAL | 5 refills | Status: DC
  Filled 2020-12-25: qty 30, 30d supply, fill #0

## 2020-12-25 MED FILL — Lidocaine HCl Local Preservative Free (PF) Inj 1%: INTRAMUSCULAR | Qty: 30 | Status: AC

## 2020-12-25 NOTE — Progress Notes (Signed)
PROGRESS NOTE    Emily Key  FVC:[id number] DOB: 12-31-1970 DOA: 12/21/2020 PCP: Azzie Glatter, FNP (Inactive)     Brief Narrative:  Emily Key is a 50 year old female with past medical history significant for poorly controlled hypertension, chronic systolic heart failure, history of empyema status post VATS in 2015, tobacco abuse who presents to the hospital with complaints of exertional chest pain and shortness of breath over the past several weeks which has been progressive in nature.  Patient was admitted for acute on chronic systolic and diastolic heart failure as well as hypertensive urgency.  Cardiology has been consulted.  New events last 24 hours / Subjective: Patient states that laying on her right side makes her feel better.  Breathing has slightly improved.  She has diuresed 1500 mL last 24 hours.  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 -Continue spironolactone, Coreg, hydralazine, Entresto, farxiga -Cardiac MRI pending  Hypertensive urgency -Renal duplex without evidence of renal artery stenosis bilaterally -Aldosterone, PRA pending -Continue spironolactone, Coreg, hydralazine, Entresto, imdur       DVT prophylaxis:  enoxaparin (LOVENOX) injection 40 mg Start: 12/25/20 0800  Code Status:     Code Status Orders  (From admission, onward)           Start     Ordered   12/21/20 1959  Full code  Continuous        12/21/20 2002           Code Status History     Date Active Date Inactive Code Status Order ID Comments User Context   01/02/2019 1410 01/05/2019 1922 Full Code [id number]  Cristal Generous, NP ED   01/02/2019 0821 01/02/2019  1410 Full Code [id number]  Karmen Bongo, MD ED   02/13/2015 1219 02/15/2015 1943 Full Code [id number]  Troy Sine, MD Inpatient   02/09/2015 1649 02/13/2015 1219 Full Code [id number]  Karen Kitchens Inpatient   01/30/2015 1618 02/04/2015 1447 Full Code [id number]  Mendel Corning, MD Inpatient   11/14/2014 1718 11/22/2014 1935 Full Code [id number]  John Giovanni, PA-C Inpatient   11/11/2014 2142 11/14/2014 1718 Full Code [id number]  Rise Patience, MD Inpatient      Family Communication: None at bedside Disposition Plan:  Status is: Inpatient  Remains inpatient appropriate because:Ongoing diagnostic testing needed not appropriate for outpatient work up  Dispo: The patient is from: Home              Anticipated d/c is to: Home              Patient currently is not medically stable to d/c.   Difficult to place patient No   Antimicrobials:  Anti-infectives (From admission, onward)    None        Objective: Vitals:   12/24/20 2001 12/25/20 0302 12/25/20 0841 12/25/20 0914  BP: (!) 143/84 (!) 155/92 (!) 161/86   Pulse: (!) 56 (!) 50 (!) 54 64  Resp: 18 18    Temp: (!) 97.5 F (36.4 C) 97.6 F (36.4 C) 98.2 F (36.8 C)   TempSrc: Oral Oral Oral   SpO2: 100% 100% 100%   Weight:  69.4 kg    Height:  Intake/Output Summary (Last 24 hours) at 12/25/2020 1427 Last data filed at 12/25/2020 0846 Gross per 24 hour  Intake 481 ml  Output 1200 ml  Net -719 ml   Filed Weights   12/23/20 0348 12/24/20 0437 12/25/20 0302  Weight: 71.3 kg 71.2 kg 69.4 kg    Examination:  General exam: Appears calm and comfortable  Respiratory system: Clear to auscultation. Respiratory effort normal. No respiratory distress. No conversational dyspnea.  Cardiovascular system: S1 & S2 heard, sinus bradycardia rate 50s. No murmurs. No pedal edema. Gastrointestinal system: Abdomen is nondistended, soft and nontender. Normal bowel sounds heard. Central nervous system: Alert and oriented. No  focal neurological deficits. Speech clear.  Extremities: Symmetric in appearance  Skin: No rashes, lesions or ulcers on exposed skin  Psychiatry: Judgement and insight appear normal. Mood & affect appropriate.   Data Reviewed: I have personally reviewed following labs and imaging studies  CBC: Recent Labs  Lab 12/21/20 1647 12/22/20 0433 12/24/20 0917 12/24/20 0923 12/24/20 1006  WBC 7.2 6.4  --   --  6.0  NEUTROABS 5.4  --   --   --   --   HGB 13.7 12.7 12.2 13.9  13.3 13.0  HCT 42.1 39.4 36.0 41.0  39.0 41.7  MCV 82.9 84.0  --   --  85.3  PLT 229 207  --   --  680   Basic Metabolic Panel: Recent Labs  Lab 12/21/20 1647 12/22/20 0433 12/23/20 0752 12/24/20 0357 12/24/20 0917 12/24/20 0923 12/25/20 0202  NA 138 138 133* 136 144 140  141 135  K 3.2* 3.7 3.9 3.6 3.0* 3.4*  3.2* 3.9  CL 104 106 102 103  --   --  101  CO2 23 24 26 25   --   --  26  GLUCOSE 104* 112* 92 90  --   --  116*  BUN 22* 21* 19 16  --   --  13  CREATININE 1.15* 1.09* 1.12* 0.98  --   --  0.93  CALCIUM 9.1 9.0 8.8* 9.0  --   --  9.2  MG  --   --   --  2.0  --   --   --    GFR: Estimated Creatinine Clearance: 69.6 mL/min (by C-G formula based on SCr of 0.93 mg/dL). Liver Function Tests: Recent Labs  Lab 12/21/20 1647  AST 25  ALT 21  ALKPHOS 61  BILITOT 1.3*  PROT 6.5  ALBUMIN 3.6   Recent Labs  Lab 12/21/20 1647  LIPASE 20   No results for input(s): AMMONIA in the last 168 hours. Coagulation Profile: No results for input(s): INR, PROTIME in the last 168 hours. Cardiac Enzymes: No results for input(s): CKTOTAL, CKMB, CKMBINDEX, TROPONINI in the last 168 hours. BNP (last 3 results) No results for input(s): PROBNP in the last 8760 hours. HbA1C: No results for input(s): HGBA1C in the last 72 hours. CBG: No results for input(s): GLUCAP in the last 168 hours. Lipid Profile: No results for input(s): CHOL, HDL, LDLCALC, TRIG, CHOLHDL, LDLDIRECT in the last 72 hours. Thyroid  Function Tests: No results for input(s): TSH, T4TOTAL, FREET4, T3FREE, THYROIDAB in the last 72 hours. Anemia Panel: No results for input(s): VITAMINB12, FOLATE, FERRITIN, TIBC, IRON, RETICCTPCT in the last 72 hours. Sepsis Labs: No results for input(s): PROCALCITON, LATICACIDVEN in the last 168 hours.  Recent Results (from the past 240 hour(s))  SARS CORONAVIRUS 2 (TAT 6-24 HRS) Nasopharyngeal Nasopharyngeal Swab  Status: None   Collection Time: 12/23/20  7:09 AM   Specimen: Nasopharyngeal Swab  Result Value Ref Range Status   SARS Coronavirus 2 NEGATIVE NEGATIVE Final    Comment: (NOTE) SARS-CoV-2 target nucleic acids are NOT DETECTED.  The SARS-CoV-2 RNA is generally detectable in upper and lower respiratory specimens during the acute phase of infection. Negative results do not preclude SARS-CoV-2 infection, do not rule out co-infections with other pathogens, and should not be used as the sole basis for treatment or other patient management decisions. Negative results must be combined with clinical observations, patient history, and epidemiological information. The expected result is Negative.  Fact Sheet for Patients: SugarRoll.be  Fact Sheet for Healthcare Providers: https://www.woods-mathews.com/  This test is not yet approved or cleared by the [place] FDA and  has been authorized for detection and/or diagnosis of SARS-CoV-2 by FDA under an Emergency Use Authorization (EUA). This EUA will remain  in effect (meaning this test can be used) for the duration of the COVID-19 declaration under Se ction 564(b)(1) of the Act, 21 U.S.C. section 360bbb-3(b)(1), unless the authorization is terminated or revoked sooner.  Performed at [facility], [place] [address], [place], [place] [address]       Radiology Studies: CARDIAC CATHETERIZATION  Result Date: 12/24/2020  Prox LAD lesion is 45% stenosed.  Findings: Ao = 177/94  (126) LV = 176/18 RA = 5 RV = 64/9 PA = 64/17 (35) PCW = 18 Fick cardiac output/index = 5.0/2.8 PVR = 3.4 WU Ao sat = 97% PA sat = 68%, 69% Assessment: 1. Mild CAD 2. Severe NICM likely due to HTN CM 3. 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 Left-sided pressures relatively well compemsated. Plan/Discussion: Medical therapy. Glori Bickers, MD 9:45 AM   VAS US RENAL ARTERY DUPLEX  Result Date: 12/25/2020 ABDOMINAL VISCERAL Patient Name:  Emily Key  Date of Exam:   12/25/2020 Medical Rec #: [id number]      Accession #:    [id number] Date of Birth: Nov 19, 1970      Patient Gender: F Patient Age:   77Y Exam Location:  [facility] Procedure:      VAS US RENAL ARTERY DUPLEX Referring Phys: [id number] CHRISTOPHER P DANFORD -------------------------------------------------------------------------------- Indications: Hypertension High Risk Factors: Hyperlipidemia. Comparison Study: No prior study Performing Technologist: Maudry Mayhew MHA, RDMS, RVT, RDCS  Examination Guidelines: A complete evaluation includes B-mode imaging, spectral Doppler, color Doppler, and power Doppler as needed of all accessible portions of each vessel. Bilateral testing is considered an integral part of a complete examination. Limited examinations for reoccurring indications may be performed as noted.  Duplex Findings: +--------------------+--------+--------+------+--------+ Mesenteric          PSV cm/sEDV cm/sPlaqueComments +--------------------+--------+--------+------+--------+ Aorta Prox             67                          +--------------------+--------+--------+------+--------+ Celiac Artery Origin  112      21                  +--------------------+--------+--------+------+--------+ SMA Proximal          168      22                  +--------------------+--------+--------+------+--------+    +------------------+--------+--------+-------+ Right Renal ArteryPSV cm/sEDV cm/sComment  +------------------+--------+--------+-------+ Origin              132  33           +------------------+--------+--------+-------+ Proximal             81      27           +------------------+--------+--------+-------+ Mid                  65      19           +------------------+--------+--------+-------+ Distal               31      9            +------------------+--------+--------+-------+ +-----------------+--------+--------+-------+ Left Renal ArteryPSV cm/sEDV cm/sComment +-----------------+--------+--------+-------+ Origin              56      17           +-----------------+--------+--------+-------+ Proximal            26      10           +-----------------+--------+--------+-------+ Mid                 35      15           +-----------------+--------+--------+-------+ Distal              33      11           +-----------------+--------+--------+-------+ +------------+--------+--------+----+-----------+--------+--------+----+ Right KidneyPSV cm/sEDV cm/sRI  Left KidneyPSV cm/sEDV cm/sRI   +------------+--------+--------+----+-----------+--------+--------+----+ Upper Pole  19      7       0.63Upper Pole 21      6       0.73 +------------+--------+--------+----+-----------+--------+--------+----+ Mid         24      6       0.73Mid        27      6       0.78 +------------+--------+--------+----+-----------+--------+--------+----+ Lower Pole  22      8       0.65Lower Pole 12      3       0.76 +------------+--------+--------+----+-----------+--------+--------+----+ Hilar       -29     -8      0.71Hilar      28      8       0.73 +------------+--------+--------+----+-----------+--------+--------+----+ +------------------+---------+------------------+---------+ Right Kidney               Left Kidney                 +------------------+---------+------------------+---------+ RAR                        RAR                          +------------------+---------+------------------+---------+ RAR (manual)      1.97     RAR (manual)      0.84      +------------------+---------+------------------+---------+ Cortex            12/4 cm/sCortex            10/3 cm/s +------------------+---------+------------------+---------+ Cortex thickness           Corex thickness             +------------------+---------+------------------+---------+ Kidney length (cm)11.80    Kidney length (cm)11.50     +------------------+---------+------------------+---------+   Summary: Renal:  Right:  No evidence of right renal artery stenosis. RRV flow present. Left:  No evidence of left renal artery stenosis. LRV flow present. Mesenteric: Normal Celiac artery and Superior Mesenteric artery findings.  *See table(s) above for measurements and observations.     Preliminary       Scheduled Meds:  carvedilol  25 mg Oral BID WC   dapagliflozin propanediol  10 mg Oral Daily   enoxaparin (LOVENOX) injection  40 mg Subcutaneous Q24H   hydrALAZINE  50 mg Oral Q8H   isosorbide mononitrate  30 mg Oral Daily   sacubitril-valsartan  1 tablet Oral BID   sodium chloride flush  3 mL Intravenous Q12H   sodium chloride flush  3 mL Intravenous Q12H   spironolactone  25 mg Oral Daily   Continuous Infusions:  sodium chloride       LOS: 3 days      Time spent: 30 minutes   Dessa Phi, DO [facility] 12/25/2020, 2:27 PM   Available via Epic secure chat 7am-7pm After these hours, please refer to coverage provider listed on [email or web address]

## 2020-12-25 NOTE — Plan of Care (Signed)

## 2020-12-25 NOTE — Progress Notes (Signed)
Renal artery duplex completed. Refer to "CV Proc" under chart review to view preliminary results.  12/25/2020 1:30 PM Kelby Aline., MHA, RVT, RDCS, RDMS

## 2020-12-25 NOTE — Progress Notes (Addendum)
Advanced Heart Failure Rounding Note   Subjective:    Cath yesterday showed mild CAD, normal output and well compensated left sided pressures. cMRI ordered. BP remains elevated.   SCr/K normal.   Still feels SOB at times if she lays on her left side. No dyspnea supine. Continues w/ RUQ tenderness. LFTs normal. Had BM this morning w/ slight relief.     R/LHC 6/16  Prox LAD lesion is 45% stenosed.   Findings:   Ao = 177/94 (126) LV = 176/18 RA = 5 RV = 64/9 PA = 64/17 (35) PCW = 18 Fick cardiac output/index = 5.0/2.8 PVR = 3.4 WU Ao sat = 97% PA sat = 68%, 69%   Assessment: 1. Mild CAD 2. Severe NICM likely due to HTN CM 3. 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 Left-sided pressures relatively well compemsated.    Objective:   Weight Range:  Vital Signs:   Temp:  [97.5 F (36.4 C)-98.2 F (36.8 C)] 98.2 F (36.8 C) (06/17 0841) Pulse Rate:  [50-77] 64 (06/17 0914) Resp:  [12-19] 18 (06/17 0302) BP: (143-163)/(84-96) 161/86 (06/17 0841) SpO2:  [0 %-100 %] 100 % (06/17 0841) Weight:  [69.4 kg] 69.4 kg (06/17 0302) Last BM Date: 12/18/20  Weight change: Filed Weights   12/23/20 0348 12/24/20 0437 12/25/20 0302  Weight: 71.3 kg 71.2 kg 69.4 kg    Intake/Output:   Intake/Output Summary (Last 24 hours) at 12/25/2020 0937 Last data filed at 12/25/2020 0846 Gross per 24 hour  Intake 611 ml  Output 1700 ml  Net -1089 ml     PHYSICAL EXAM: General:  mildly fatigued appearing. No respiratory difficulty HEENT: normal Neck: supple. JVD 7 cm. Carotids 2+ bilat; no bruits. No lymphadenopathy or thyromegaly appreciated. Cor: PMI nondisplaced. Regular rate & rhythm. No rubs, gallops or murmurs. Lungs: clear Abdomen: soft, nondistended. + RUQ tenderness No hepatosplenomegaly. No bruits or masses. Good bowel sounds. Extremities: no cyanosis, clubbing, rash, edema Neuro: alert & oriented x 3, cranial nerves grossly intact. moves all 4 extremities w/o  difficulty. Affect pleasant.  Telemetry: NSR 80s  Personally reviewed   Labs: Basic Metabolic Panel: Recent Labs  Lab 12/21/20 1647 12/22/20 0433 12/23/20 0752 12/24/20 0357 12/24/20 0917 12/24/20 0923 12/25/20 0202  NA 138 138 133* 136 144 140  141 135  K 3.2* 3.7 3.9 3.6 3.0* 3.4*  3.2* 3.9  CL 104 106 102 103  --   --  101  CO2 23 24 26 25   --   --  26  GLUCOSE 104* 112* 92 90  --   --  116*  BUN 22* 21* 19 16  --   --  13  CREATININE 1.15* 1.09* 1.12* 0.98  --   --  0.93  CALCIUM 9.1 9.0 8.8* 9.0  --   --  9.2  MG  --   --   --  2.0  --   --   --     Liver Function Tests: Recent Labs  Lab 12/21/20 1647  AST 25  ALT 21  ALKPHOS 61  BILITOT 1.3*  PROT 6.5  ALBUMIN 3.6   Recent Labs  Lab 12/21/20 1647  LIPASE 20   No results for input(s): AMMONIA in the last 168 hours.  CBC: Recent Labs  Lab 12/21/20 1647 12/22/20 0433 12/24/20 0917 12/24/20 0923 12/24/20 1006  WBC 7.2 6.4  --   --  6.0  NEUTROABS 5.4  --   --   --   --  HGB 13.7 12.7 12.2 13.9  13.3 13.0  HCT 42.1 39.4 36.0 41.0  39.0 41.7  MCV 82.9 84.0  --   --  85.3  PLT 229 207  --   --  211    Cardiac Enzymes: No results for input(s): CKTOTAL, CKMB, CKMBINDEX, TROPONINI in the last 168 hours.  BNP: BNP (last 3 results) Recent Labs    12/21/20 1648  BNP 1,290.8*    ProBNP (last 3 results) No results for input(s): PROBNP in the last 8760 hours.    Other results:  Imaging: CARDIAC CATHETERIZATION  Result Date: 12/24/2020  Prox LAD lesion is 45% stenosed.  Findings: Ao = 177/94 (126) LV = 176/18 RA = 5 RV = 64/9 PA = 64/17 (35) PCW = 18 Fick cardiac output/index = 5.0/2.8 PVR = 3.4 WU Ao sat = 97% PA sat = 68%, 69% Assessment: 1. Mild CAD 2. Severe NICM likely due to HTN CM 3. 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 Left-sided pressures relatively well compemsated. Plan/Discussion: Medical therapy. Glori Bickers, MD 9:45 AM     Medications:     Scheduled  Medications:  carvedilol  25 mg Oral BID WC   enoxaparin (LOVENOX) injection  40 mg Subcutaneous Q24H   hydrALAZINE  50 mg Oral Q8H   sacubitril-valsartan  1 tablet Oral BID   sodium chloride flush  3 mL Intravenous Q12H   sodium chloride flush  3 mL Intravenous Q12H   spironolactone  25 mg Oral Daily    Infusions:  sodium chloride      PRN Medications: sodium chloride, acetaminophen **OR** acetaminophen, labetalol, nitroGLYCERIN, ondansetron **OR** ondansetron (ZOFRAN) IV, polyethylene glycol, sodium chloride flush, zolpidem   Assessment/Plan      Acute on Chronic Systolic Heart Failure with chest pressure  -NICM. LHC in 2016 showed normal coronaries - Echo 11/2014:  LVEF 50-55%, RV normal - Echo 02/2015: LVEF 40-45% (felt stress induced) AK of apical myocardium, G2DD, RV normal - Echo 12/2018: LVEF 40-45%, RV mildly reduced - EF now 20-25%, RV mildly reduced, in the setting of poorly controlled HTN/ hypertensive emergency  - Good response to IV Lasix, appears well diuresed. RHC after diureses showed well compensated left sided filling pressures - LHC w/ mild CAD. Suspect CM 2/2 HTN - Plan cMRI  - Continue titration of GDMT  - Entresto 97-103 mg bid  - Spironolactone 25 mg daily - Coreg 25 mg bid - Hydralazine 50 mg tid  - Start Imdur 30 daily  - Start Farxiga 10 mg daily (hgb A1c 5.7)   2. Hypertension - poorly controlled in setting of poor med compliance - continue to titrate with goal SBP 130-150. See plan above  - will need outpatient sleep study to r/o OSA - PharmD to help with med access   3. Moderate MR/TR - likely functional in setting of dilated LV/RV   4. 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 Tobacco abuse - smokes 3/4 ppd, smoking cessation advised   6. RUQ Pain -  markedly tender on exam, LFTs ok  - Check RUQ [place]    7. CAD - LAD 45% - start statin  PharmD assisting w/ med assistance for [facility] and Farxiga   Length of Stay: 3   Brittainy Ladoris Gene  12/25/2020, 9:37 AM  Advanced  Heart Failure Team Pager [phone] (M-F; [place])  Please contact [facility] for night-coverage after hours (4p -7a ) and weekends on [email or web address]   Patient seen and examined with the above-signed Advanced Practice Provider and/or Housestaff. I personally reviewed laboratory data, imaging studies and relevant notes. I independently examined the patient and formulated the important aspects of the plan. I have edited the note to reflect any of my changes or salient points. I have personally discussed the plan with the patient and/or family.  Results of cath reviewed with her. Cath sites ok. Having some abdominal pain today. Thinks its constipation. No SOB, orthopnea or PND.  Renal artery u/s no RAS or FMD  cMRI results still pending  General:  Well appearing. No resp difficulty HEENT: normal Neck: supple. no JVD. Carotids 2+ bilat; no bruits. No lymphadenopathy or thryomegaly appreciated. Cor: PMI nondisplaced. Regular rate & rhythm. No rubs, gallops or murmurs. Lungs: clear Abdomen: soft, mildly tender, nondistended. No hepatosplenomegaly. No bruits or masses. Good bowel sounds. Extremities: no cyanosis, clubbing, rash, edema Neuro: alert & orientedx3, cranial nerves grossly intact. moves all 4 extremities w/o difficulty. Affect pleasant  Stable from HF perspective. Abdominal exam unimpressive to me.   Ok for d/c on following meds - Entresto 97-103 mg bid  - Spironolactone 25 mg daily - Coreg 25 mg bid - Hydralazine 50 mg tid  - Start Imdur 30 daily  - Start Farxiga 10 mg daily (hgb A1c 5.7) - Atorva 20  - Lasix 40mg  PRN only for fluid overload  Will arrange f/u in HF Clinic.    Glori Bickers, MD  10:19 PM

## 2020-12-25 NOTE — TOC Progression Note (Signed)
Transition of Care (TOC) - Progression Note  Heart Failure  Patient Details  Name: Emily Key MRN: [id number] Date of Birth: January 07, 1971  Transition of Care [facility]) CM/SW [place], [place] Phone Number: 12/25/2020, 2:40 PM  Clinical Narrative:    CSW spoke with the patient at bedside to bring them an appointment card for the [facility] outpatient clinic and encouraged them to follow up and to attend the appointment and bring their medications and if anything changes to please reach out so that CSW/HV clinic team can provide support. CSW also provided Ms. Gary with an appointment reminder for her primary care appointment as well.   TOC will continue to follow for discharge needs.     Barriers to Discharge: Continued Medical Work up  Expected Discharge Plan and Services   In-house Referral: Clinical Social Work     Living arrangements for the past 2 months: Single Family Home                                       Social Determinants of Health (SDOH) Interventions Food Insecurity Interventions: Assist with [employer] Application Financial Strain Interventions: Other (Comment) (servant center referral for food stamp and disability referral to [facility] and outpatient HV clinic for follow up) Housing Interventions: Intervention Not Indicated Transportation Interventions: [employer]  Readmission Risk Interventions No flowsheet data found.  Jenella Craigie, MSW, [place] Heart Failure Social Worker

## 2020-12-26 ENCOUNTER — Inpatient Hospital Stay (HOSPITAL_COMMUNITY): Payer: Medicaid Other

## 2020-12-26 DIAGNOSIS — I5023 Acute on chronic systolic (congestive) heart failure: Secondary | ICD-10-CM

## 2020-12-26 LAB — BASIC METABOLIC PANEL
Anion gap: 6 (ref 5–15)
BUN: 19 mg/dL (ref 6–20)
CO2: 27 mmol/L (ref 22–32)
Calcium: 9.2 mg/dL (ref 8.9–10.3)
Chloride: 103 mmol/L (ref 98–111)
Creatinine, Ser: 1.06 mg/dL — ABNORMAL HIGH (ref 0.44–1.00)
GFR, Estimated: >60 mL/min (ref >60)
Glucose, Bld: 111 mg/dL — ABNORMAL HIGH (ref 70–99)
Potassium: 4.5 mmol/L (ref 3.5–5.1)
Sodium: 136 mmol/L (ref 135–145)

## 2020-12-26 LAB — MAGNESIUM: Magnesium: 2 mg/dL (ref 1.7–2.4)

## 2020-12-26 MED ORDER — ATORVASTATIN CALCIUM 20 MG PO TABS: 20.0000 mg | ORAL_TABLET | Freq: Every day | ORAL | 2 refills | Status: DC

## 2020-12-26 MED ORDER — FUROSEMIDE 40 MG PO TABS: 40.0000 mg | ORAL_TABLET | Freq: Every day | ORAL | 0 refills | Status: DC | PRN

## 2020-12-26 NOTE — Discharge Summary (Signed)
Physician Discharge Summary  Emily Key GYK:[id number] DOB: 1970-12-30 DOA: 12/21/2020  PCP: Azzie Glatter, FNP (Inactive)  Admit date: 12/21/2020 Discharge date: 12/26/2020  Admitted From: Home Disposition:  Home  Recommendations for Outpatient Follow-up:  Follow up with PCP in 1 week Follow up with Heart failure clinic as scheduled  Aldosterone, PRA pending   Discharge Condition: Stable CODE STATUS: Full code Diet recommendation: Heart healthy diet  Brief/Interim Summary: Emily Key is a 50 year old female with past medical history significant for poorly controlled hypertension, chronic systolic heart failure, history of empyema status post VATS in 2015, tobacco abuse who presents to the hospital with complaints of exertional chest pain and shortness of breath over the past several weeks which has been progressive in nature.  Patient was admitted for acute on chronic systolic and diastolic heart failure as well as hypertensive urgency.  Cardiology has been consulted.  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  Patient's medications were adjusted, symptoms improved.  She will follow-up with advanced heart failure clinic as an outpatient.  Due to her complaints of right upper quadrant abdominal pain, ultrasound was completed which was unremarkable.  On day of discharge, her abdominal pain had improved, abdominal exam unremarkable.  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 Global hypokinesis. Moderately reduced right ventricular systolic function with mild right ventricular enlargement. RVEF 35%. Global hypokinesis. ECV 35%, elevated and may be consistent with hypertensive heart disease. Delayed myocardial enhancement in the superior and inferior RV insertion points, with extension to the midmyocardium in the antero- and inferoseptum. This may represent increased pulmonary artery pressure. Small circumferential pericardial effusion. Findings most consistent with hypertensive heart disease and pulmonary hypertension. -Continue spironolactone, Coreg, hydralazine, Entresto, farxiga   Hypertensive urgency -Renal duplex without evidence of renal artery stenosis bilaterally -Aldosterone, PRA pending -Continue spironolactone, Coreg, hydralazine, Entresto, imdur     Discharge Instructions  Discharge Instructions     (HEART FAILURE PATIENTS) Call MD:  Anytime you have any of the following symptoms: 1) 3 pound weight gain in 24 hours or 5 pounds in 1 week 2) shortness of breath, with or without a dry hacking cough 3) swelling in the hands, feet or stomach 4) if you have to sleep on extra pillows at night in order to breathe.   Complete by: As directed    Call MD for:  difficulty breathing, headache or visual disturbances   Complete by: As directed    Call MD for:  extreme fatigue   Complete by: As directed    Call MD for:  persistant dizziness or light-headedness   Complete by: As directed    Call MD for:  persistant nausea and vomiting   Complete by: As directed    Call MD for:  severe uncontrolled pain   Complete by: As directed    Call MD for:  temperature >100.4   Complete by: As directed    Diet - low sodium heart  healthy   Complete by: As directed    Discharge instructions   Complete by: As directed     You were cared for by a hospitalist during your hospital stay. If you have any questions about your discharge medications or the care you received while you were in the hospital after you are discharged, you can call the unit and ask to speak with the hospitalist on call if the hospitalist that took care of you is not available. Once you are discharged, your primary care physician will handle any further medical issues. Please note that NO REFILLS for any discharge medications will be authorized once you are discharged, as it is imperative that you return to your primary care physician (or establish a relationship with a primary care physician if you do not have one) for your aftercare needs so that they can reassess your need for medications and monitor your lab values.   Increase activity slowly   Complete by: As directed       Allergies as of 12/26/2020   No Known Allergies      Medication List     STOP taking these medications    cetirizine 10 MG tablet Commonly known as: ZYRTEC   hydrochlorothiazide 25 MG tablet Commonly known as: HYDRODIURIL   ibuprofen 200 MG tablet Commonly known as: ADVIL   lisinopril 10 MG tablet Commonly known as: ZESTRIL   potassium chloride 10 MEQ tablet Commonly known as: KLOR-CON   Vitamin D (Ergocalciferol) 1.25 MG (50000 UNIT) Caps capsule Commonly known as: DRISDOL       TAKE these medications    atorvastatin 20 MG tablet Commonly known as: Lipitor Take 1 tablet (20 mg total) by mouth daily.   carvedilol 25 MG tablet Commonly known as: COREG Take 1 tablet (25 mg total) by mouth 2 (two) times daily with a meal.   Entresto 97-103 MG Generic drug: sacubitril-valsartan Take 1 tablet by mouth 2 (two) times daily.   Farxiga 10 MG Tabs tablet Generic drug: dapagliflozin propanediol Take 1 tablet (10 mg total) by mouth daily.   furosemide 40 MG tablet Commonly known as: Lasix Take 1 tablet (40 mg total) by mouth daily as needed for  fluid or edema.   hydrALAZINE 50 MG tablet Commonly known as: APRESOLINE Take 1 tablet (50 mg total) by mouth 3 (three) times daily.   isosorbide mononitrate 30 MG 24 hr tablet Commonly known as: IMDUR Take 1 tablet (30 mg total) by mouth daily.   nicotine 14 mg/24hr patch Commonly known as: NICODERM CQ - dosed in mg/24 hours Place 1 patch (14 mg total) onto the skin daily.   spironolactone 25 MG tablet Commonly known as: ALDACTONE Take 1 tablet (25 mg total) by mouth daily.        Follow-up Information     Azzie Glatter, FNP. Go on 02/05/2021.   Specialty: Family Medicine Why: @9 :20am Contact information: [place] [address] [phone]         [facility] Follow up.   Specialty: Cardiology Why: July 12, 10:30 AM at the [facility] at [facility], [place] Code [phone] Contact information: [address] [id number] [place] [phone]               No Known Allergies  Consultations: Cardiology    Procedures/Studies: CARDIAC CATHETERIZATION  Result Date: 12/24/2020  Prox LAD lesion is 45% stenosed.  Findings: Ao = 177/94 (126) LV = 176/18 RA = 5 RV = 64/9 PA = 64/17 (35) PCW = 18 Fick cardiac output/index = 5.0/2.8 PVR = 3.4 WU Ao sat = 97% PA sat = 68%, 69% Assessment: 1. Mild CAD 2. Severe NICM likely due to HTN CM 3. 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 Left-sided pressures relatively well compemsated. Plan/Discussion: Medical therapy. Glori Bickers, MD 9:45 AM   DG Chest Port 1 View  Result Date: 12/21/2020 CLINICAL DATA:  Chest pain EXAM: PORTABLE CHEST 1 VIEW COMPARISON:  January 04, 2019 FINDINGS: There is cardiomegaly with pulmonary vascularity normal. There is mild left lower lobe atelectatic change. No edema or airspace opacity. No adenopathy. No pneumothorax. No bone lesions. IMPRESSION: Mild left lower lobe  atelectasis. No edema or airspace opacity. There is cardiomegaly with pulmonary vascularity within normal limits. Electronically Signed   By: Lowella Grip III M.D.   On: 12/21/2020 15:43   MR CARDIAC MORPHOLOGY W WO CONTRAST  Result Date: 12/26/2020 CLINICAL DATA:  Heart failure, systolic, further testing EXAM: CARDIAC MRI TECHNIQUE: The patient was scanned on a 1.5 Tesla GE magnet. A dedicated cardiac coil was used. Functional imaging was done using Fiesta sequences. 2,3, and 4 chamber views were done to assess for RWMA's. Modified Simpson's rule using a short axis stack was used to calculate an ejection fraction on a dedicated work Conservation officer, nature. The patient received 95mL GADAVIST GADOBUTROL 1 MMOL/ML IV SOLN. After 10 minutes inversion recovery sequences were used to assess for infiltration and scar tissue. This examination is tailored for evaluation cardiac anatomy and function and provides very limited assessment of noncardiac structures, which are accordingly not evaluated during interpretation. If there is clinical concern for extracardiac pathology, further evaluation with CT imaging should be considered. FINDINGS: LEFT VENTRICLE: Moderate left ventricular chamber enlargement. Mildly left ventricular hypertrophy. Severely reduced left ventricular systolic function. LVEF = 22% Severe global hypokinesis. No myocardial edema, T2 time 49 msec. Normal first pass perfusion. There is post contrast delayed myocardial enhancement in the superior and inferior RV insertion points from base to apex. This suggests increased pulmonary pressure. LGE extends into the midmyocardium of the anteroseptum and inferoseptum from the RV insertion points. Mild LGE noted along subendocardium of lateral wall however this may be slow flow of contrast in trabeculations moreso than subendocardial delayed enhancement. The lateral wall appears thin with trabeculations in the area of slow contrast flow. Normal T1  myocardial nulling kinetics suggest against a diagnosis of cardiac amyloidosis. ECV = 35%, mildly elevated, may be consistent with hypertensive heart disease. RIGHT VENTRICLE: Mild right ventricular chamber enlargement. Normal right ventricular wall thickness. Moderately reduced right ventricular systolic function. RVEF = 35% Global hypokinesis. No post contrast delayed myocardial enhancement. ATRIA: Moderate left atrial chamber enlargement. Normal right atrial size. VALVES: No significant valvular abnormalities. Regurgitant valves noted on recent echocardiogram are not well appreciated visually on this study. Tricuspid aortic valve. PERICARDIUM: Normal pericardium.  Small circumferential pericardial effusion. OTHER: No significant extracardiac findings. MEASUREMENTS: Qp/Qs 0.95 Left ventricle: LV Female LV EF: 22% (normal 56-78%) Absolute volumes: LV EDV: 267mL (normal 52-141 mL) LV ESV: 176mL (Normal 13-51 mL) LV SV: 56mL (Normal 33-97 mL) CO: 2.9L/min (Normal 2.7-6.0 L/min) Indexed volumes: LV EDV: 137mL/sq-m (Normal 41-81 mL/sq-m) LV ESV: 124mL/sq-m (Normal 12-21 mL/sq-m) LV SV: 52mL/sq-m (Normal 26-56 mL/sq-m) CI: 1.6L/min/sq-m (normal 1.8-3.8 L/min/sq-m) Right ventricle: RV female RV EF: 35% (normal 47-80%) Absolute volumes: RV EDV: 185 mL (Normal 58-154 mL) RV  ESV: 121 mL (Normal 12-68 mL) RV SV: 65 mL (Normal 35-98 mL) CO: 3.6 L/min (Normal 2.7-6 L/min) Indexed volumes: RV EDV: 102 ML/sq-m (Normal 48-87 mL/sq-m) RV ESV: 67 mL/sq-m (Normal 11-28 mL/sq-m) RV SV: 36 mL/sq-m (Normal 27-57 mL/sq-m) CI: 1.96 L/min/sq-m (Normal 1.8-3.8 L/min/sq-m) IMPRESSION: 1. Severely reduced left ventricular systolic function with moderate left ventricular dilation, LVEF 22%. Global hypokinesis. 2. Moderately reduced right ventricular systolic function with mild right ventricular enlargement. RVEF 35%. Global hypokinesis. 3. ECV 35%, elevated and may be consistent with hypertensive heart disease. 4. Delayed myocardial  enhancement in the superior and inferior RV insertion points, with extension to the midmyocardium in the antero- and inferoseptum. This may represent increased pulmonary artery pressure. 5. Small circumferential pericardial effusion. Findings most consistent with hypertensive heart disease and pulmonary hypertension. Electronically Signed   By: Cherlynn Kaiser   On: 12/26/2020 06:47   ECHOCARDIOGRAM COMPLETE  Result Date: 12/22/2020    ECHOCARDIOGRAM REPORT   Patient Name:   Emily Key Date of Exam: 12/22/2020 Medical Rec #:  [id number]     Height:       67.0 in Accession #:    [id number]    Weight:       160.3 lb Date of Birth:  07-27-1970     BSA:          1.841 m Patient Age:    50 years      BP:           159/101 mmHg Patient Gender: F             HR:           64 bpm. Exam Location:  Inpatient Procedure: 2D Echo, Cardiac Doppler and Color Doppler Indications:    dyspnea  History:        Patient has prior history of Echocardiogram examinations, most                 recent 01/02/2019. CHF, Signs/Symptoms:Dyspnea; Risk                 Factors:Dyslipidemia and Hypertension.  Sonographer:    Bernadene Person RDCS Referring Phys: [place]  1. Left ventricular ejection fraction, by estimation, is 20 to 25%. The left ventricle has severely decreased function. The left ventricle demonstrates global hypokinesis. The left ventricular internal cavity size was moderately dilated. Left ventricular diastolic parameters are consistent with Grade III diastolic dysfunction (restrictive).  2. Right ventricular systolic function is mildly reduced. The right ventricular size is normal. There is severely elevated pulmonary artery systolic pressure.  3. Left atrial size was severely dilated.  4. The mitral valve is normal in structure. Moderate mitral valve regurgitation. No evidence of mitral stenosis.  5. Tricuspid valve regurgitation is moderate.  6. The aortic valve is tricuspid. Aortic valve  regurgitation is not visualized. No aortic stenosis is present.  7. The inferior vena cava is dilated in size with <50% respiratory variability, suggesting right atrial pressure of 15 mmHg. FINDINGS  Left Ventricle: Left ventricular ejection fraction, by estimation, is 20 to 25%. The left ventricle has severely decreased function. The left ventricle demonstrates global hypokinesis. The left ventricular internal cavity size was moderately dilated. There is no left ventricular hypertrophy. Left ventricular diastolic parameters are consistent with Grade III diastolic dysfunction (restrictive). Right Ventricle: The right ventricular size is normal. Right ventricular systolic function is mildly reduced. There is severely elevated pulmonary artery systolic pressure. The tricuspid regurgitant velocity is 3.37 m/s,  and with an assumed right atrial pressure of 15 mmHg, the estimated right ventricular systolic pressure is 73.7 mmHg. Left Atrium: Left atrial size was severely dilated. Right Atrium: Right atrial size was normal in size. Pericardium: Trivial pericardial effusion is present. Mitral Valve: The mitral valve is normal in structure. Moderate mitral valve regurgitation. No evidence of mitral valve stenosis. Tricuspid Valve: The tricuspid valve is normal in structure. Tricuspid valve regurgitation is moderate . No evidence of tricuspid stenosis. Aortic Valve: The aortic valve is tricuspid. Aortic valve regurgitation is not visualized. No aortic stenosis is present. Pulmonic Valve: The pulmonic valve was normal in structure. Pulmonic valve regurgitation is mild. No evidence of pulmonic stenosis. Aorta: The aortic root is normal in size and structure. Venous: The inferior vena cava is dilated in size with less than 50% respiratory variability, suggesting right atrial pressure of 15 mmHg. IAS/Shunts: No atrial level shunt detected by color flow Doppler.  LEFT VENTRICLE PLAX 2D LVIDd:         6.20 cm      Diastology LVIDs:          5.00 cm      LV e' medial:  4.05 cm/s LV PW:         1.00 cm      LV e' lateral: 4.61 cm/s LV IVS:        0.80 cm LVOT diam:     2.00 cm LV SV:         36 LV SV Index:   20 LVOT Area:     3.14 cm  LV Volumes (MOD) LV vol d, MOD A2C: 186.0 ml LV vol d, MOD A4C: 151.0 ml LV vol s, MOD A2C: 132.0 ml LV vol s, MOD A4C: 96.2 ml LV SV MOD A2C:     54.0 ml LV SV MOD A4C:     151.0 ml LV SV MOD BP:      58.3 ml RIGHT VENTRICLE RV S prime:     10.20 cm/s TAPSE (M-mode): 1.4 cm LEFT ATRIUM             Index       RIGHT ATRIUM           Index LA diam:        4.20 cm 2.28 cm/m  RA Area:     13.90 cm LA Vol (A2C):   74.3 ml 40.37 ml/m RA Volume:   28.60 ml  15.54 ml/m LA Vol (A4C):   96.9 ml 52.65 ml/m LA Biplane Vol: 87.7 ml 47.65 ml/m  AORTIC VALVE LVOT Vmax:   73.60 cm/s LVOT Vmean:  51.600 cm/s LVOT VTI:    0.116 m  AORTA Ao Root diam: 2.80 cm Ao Asc diam:  3.10 cm MR Peak grad:    136.4 mmHg  TRICUSPID VALVE MR Mean grad:    79.0 mmHg   TR Peak grad:   45.4 mmHg MR Vmax:         584.00 cm/s TR Vmax:        337.00 cm/s MR Vmean:        405.0 cm/s MR PISA:         1.01 cm    SHUNTS MR PISA Eff ROA: 7 mm       Systemic VTI:  0.12 m MR PISA Radius:  0.40 cm     Systemic Diam: 2.00 cm Kirk Ruths MD Electronically signed by Kirk Ruths MD Signature Date/Time: 12/22/2020/3:24:48 PM    Final  VAS US RENAL ARTERY DUPLEX  Result Date: 12/25/2020 ABDOMINAL VISCERAL Patient Name:  SHOSHANNAH FAUBERT  Date of Exam:   12/25/2020 Medical Rec #: [id number]      Accession #:    [id number] Date of Birth: 1970/11/18      Patient Gender: F Patient Age:   87Y Exam Location:  [facility] Procedure:      VAS US RENAL ARTERY DUPLEX Referring Phys: [id number] CHRISTOPHER P DANFORD -------------------------------------------------------------------------------- Indications: Hypertension High Risk Factors: Hyperlipidemia. Comparison Study: No prior study Performing Technologist: Maudry Mayhew MHA, RDMS, RVT, RDCS   Examination Guidelines: A complete evaluation includes B-mode imaging, spectral Doppler, color Doppler, and power Doppler as needed of all accessible portions of each vessel. Bilateral testing is considered an integral part of a complete examination. Limited examinations for reoccurring indications may be performed as noted.  Duplex Findings: +--------------------+--------+--------+------+--------+ Mesenteric          PSV cm/sEDV cm/sPlaqueComments +--------------------+--------+--------+------+--------+ Aorta Prox             67                          +--------------------+--------+--------+------+--------+ Celiac Artery Origin  112      21                  +--------------------+--------+--------+------+--------+ SMA Proximal          168      22                  +--------------------+--------+--------+------+--------+    +------------------+--------+--------+-------+ Right Renal ArteryPSV cm/sEDV cm/sComment +------------------+--------+--------+-------+ Origin              132      33           +------------------+--------+--------+-------+ Proximal             81      27           +------------------+--------+--------+-------+ Mid                  65      19           +------------------+--------+--------+-------+ Distal               31      9            +------------------+--------+--------+-------+ +-----------------+--------+--------+-------+ Left Renal ArteryPSV cm/sEDV cm/sComment +-----------------+--------+--------+-------+ Origin              56      17           +-----------------+--------+--------+-------+ Proximal            26      10           +-----------------+--------+--------+-------+ Mid                 35      15           +-----------------+--------+--------+-------+ Distal              33      11           +-----------------+--------+--------+-------+  +------------+--------+--------+----+-----------+--------+--------+----+ Right KidneyPSV cm/sEDV cm/sRI  Left KidneyPSV cm/sEDV cm/sRI   +------------+--------+--------+----+-----------+--------+--------+----+ Upper Pole  19      7       0.63Upper Pole 21      6       0.73 +------------+--------+--------+----+-----------+--------+--------+----+ Mid  24      6       0.73Mid        27      6       0.78 +------------+--------+--------+----+-----------+--------+--------+----+ Lower Pole  22      8       0.65Lower Pole 12      3       0.76 +------------+--------+--------+----+-----------+--------+--------+----+ Hilar       -29     -8      0.71Hilar      28      8       0.73 +------------+--------+--------+----+-----------+--------+--------+----+ +------------------+---------+------------------+---------+ Right Kidney               Left Kidney                 +------------------+---------+------------------+---------+ RAR                        RAR                         +------------------+---------+------------------+---------+ RAR (manual)      1.97     RAR (manual)      0.84      +------------------+---------+------------------+---------+ Cortex            12/4 cm/sCortex            10/3 cm/s +------------------+---------+------------------+---------+ Cortex thickness           Corex thickness             +------------------+---------+------------------+---------+ Kidney length (cm)11.80    Kidney length (cm)11.50     +------------------+---------+------------------+---------+  Summary: Renal:  Right: No evidence of right renal artery stenosis. RRV flow present. Left:  No evidence of left renal artery stenosis. LRV flow present. Mesenteric: Normal Celiac artery and Superior Mesenteric artery findings.  *See table(s) above for measurements and observations.  Diagnosing physician: Harold Barban MD  Electronically signed by Harold Barban  MD on 12/25/2020 at 10:23:07 PM.    Final    US Abdomen Limited RUQ (LIVER/GB)  Result Date: 12/26/2020 CLINICAL DATA:  Right upper quadrant pain. EXAM: ULTRASOUND ABDOMEN LIMITED RIGHT UPPER QUADRANT COMPARISON:  None. FINDINGS: Gallbladder: No gallstones or wall thickening visualized. No sonographic Murphy sign noted by sonographer. Common bile duct: Diameter: 5 mm, within normal limits. Liver: No focal lesion identified. Within normal limits in parenchymal echogenicity. Portal vein is patent on color Doppler imaging with normal direction of blood flow towards the liver. Other: None. IMPRESSION: Negative. No hepatobiliary abnormality identified. Electronically Signed   By: Marlaine Hind M.D.   On: 12/26/2020 08:48       Discharge Exam: Vitals:   12/26/20 0520 12/26/20 0810  BP: (!) 154/93 137/80  Pulse: (!) 59 (!) 59  Resp: 20   Temp: 97.6 F (36.4 C) 97.6 F (36.4 C)  SpO2: 100% 98%    General: Pt is alert, awake, not in acute distress Cardiovascular: RRR, S1/S2 +, no edema Respiratory: CTA bilaterally, no wheezing, no rhonchi, no respiratory distress, no conversational dyspnea  Abdominal: Soft, NT, ND, bowel sounds + Extremities: no edema, no cyanosis Psych: Normal mood and affect, stable judgement and insight     The results of significant diagnostics from this hospitalization (including imaging, microbiology, ancillary and laboratory) are listed below for reference.     Microbiology: Recent Results (from the past 240 hour(s))  SARS CORONAVIRUS 2 (TAT 6-24 HRS) Nasopharyngeal Nasopharyngeal  Swab     Status: None   Collection Time: 12/23/20  7:09 AM   Specimen: Nasopharyngeal Swab  Result Value Ref Range Status   SARS Coronavirus 2 NEGATIVE NEGATIVE Final    Comment: (NOTE) SARS-CoV-2 target nucleic acids are NOT DETECTED.  The SARS-CoV-2 RNA is generally detectable in upper and lower respiratory specimens during the acute phase of infection. Negative results do not  preclude SARS-CoV-2 infection, do not rule out co-infections with other pathogens, and should not be used as the sole basis for treatment or other patient management decisions. Negative results must be combined with clinical observations, patient history, and epidemiological information. The expected result is Negative.  Fact Sheet for Patients: SugarRoll.be  Fact Sheet for Healthcare Providers: https://www.woods-mathews.com/  This test is not yet approved or cleared by the [place] FDA and  has been authorized for detection and/or diagnosis of SARS-CoV-2 by FDA under an Emergency Use Authorization (EUA). This EUA will remain  in effect (meaning this test can be used) for the duration of the COVID-19 declaration under Se ction 564(b)(1) of the Act, 21 U.S.C. section 360bbb-3(b)(1), unless the authorization is terminated or revoked sooner.  Performed at [facility], [place] [address], [place],  [address]      Labs: BNP (last 3 results) Recent Labs    12/21/20 1648  BNP 0,947.0*   Basic Metabolic Panel: Recent Labs  Lab 12/22/20 0433 12/23/20 0752 12/24/20 0357 12/24/20 0917 12/24/20 0923 12/25/20 0202 12/26/20 0353  NA 138 133* 136 144 140  141 135 136  K 3.7 3.9 3.6 3.0* 3.4*  3.2* 3.9 4.5  CL 106 102 103  --   --  101 103  CO2 24 26 25   --   --  26 27  GLUCOSE 112* 92 90  --   --  116* 111*  BUN 21* 19 16  --   --  13 19  CREATININE 1.09* 1.12* 0.98  --   --  0.93 1.06*  CALCIUM 9.0 8.8* 9.0  --   --  9.2 9.2  MG  --   --  2.0  --   --   --  2.0   Liver Function Tests: Recent Labs  Lab 12/21/20 1647  AST 25  ALT 21  ALKPHOS 61  BILITOT 1.3*  PROT 6.5  ALBUMIN 3.6   Recent Labs  Lab 12/21/20 1647  LIPASE 20   No results for input(s): AMMONIA in the last 168 hours. CBC: Recent Labs  Lab 12/21/20 1647 12/22/20 0433 12/24/20 0917 12/24/20 0923 12/24/20 1006  WBC 7.2 6.4  --   --  6.0   NEUTROABS 5.4  --   --   --   --   HGB 13.7 12.7 12.2 13.9  13.3 13.0  HCT 42.1 39.4 36.0 41.0  39.0 41.7  MCV 82.9 84.0  --   --  85.3  PLT 229 207  --   --  211   Cardiac Enzymes: No results for input(s): CKTOTAL, CKMB, CKMBINDEX, TROPONINI in the last 168 hours. BNP: Invalid input(s): POCBNP CBG: No results for input(s): GLUCAP in the last 168 hours. D-Dimer No results for input(s): DDIMER in the last 72 hours. Hgb A1c No results for input(s): HGBA1C in the last 72 hours. Lipid Profile No results for input(s): CHOL, HDL, LDLCALC, TRIG, CHOLHDL, LDLDIRECT in the last 72 hours. Thyroid function studies No results for input(s): TSH, T4TOTAL, T3FREE, THYROIDAB in the last 72 hours.  Invalid  input(s): FREET3 Anemia work up No results for input(s): VITAMINB12, FOLATE, FERRITIN, TIBC, IRON, RETICCTPCT in the last 72 hours. Urinalysis    Component Value Date/Time   COLORURINE YELLOW 01/02/2019 0354   APPEARANCEUR CLEAR 01/02/2019 0354   LABSPEC 1.012 01/02/2019 0354   PHURINE 6.0 01/02/2019 0354   GLUCOSEU 150 (A) 01/02/2019 0354   HGBUR SMALL (A) 01/02/2019 0354   BILIRUBINUR negative 02/22/2019 1405   KETONESUR negative 02/22/2019 1405   KETONESUR NEGATIVE 01/02/2019 0354   PROTEINUR negative 02/22/2019 1405   PROTEINUR >=300 (A) 01/02/2019 0354   UROBILINOGEN 0.2 02/22/2019 1405   UROBILINOGEN 0.2 02/09/2015 1242   NITRITE Negative 02/22/2019 1405   NITRITE NEGATIVE 01/02/2019 0354   LEUKOCYTESUR Trace (A) 02/22/2019 1405   LEUKOCYTESUR NEGATIVE 01/02/2019 0354   Sepsis Labs Invalid input(s): PROCALCITONIN,  WBC,  LACTICIDVEN Microbiology Recent Results (from the past 240 hour(s))  SARS CORONAVIRUS 2 (TAT 6-24 HRS) Nasopharyngeal Nasopharyngeal Swab     Status: None   Collection Time: 12/23/20  7:09 AM   Specimen: Nasopharyngeal Swab  Result Value Ref Range Status   SARS Coronavirus 2 NEGATIVE NEGATIVE Final    Comment: (NOTE) SARS-CoV-2 target nucleic acids  are NOT DETECTED.  The SARS-CoV-2 RNA is generally detectable in upper and lower respiratory specimens during the acute phase of infection. Negative results do not preclude SARS-CoV-2 infection, do not rule out co-infections with other pathogens, and should not be used as the sole basis for treatment or other patient management decisions. Negative results must be combined with clinical observations, patient history, and epidemiological information. The expected result is Negative.  Fact Sheet for Patients: SugarRoll.be  Fact Sheet for Healthcare Providers: https://www.woods-mathews.com/  This test is not yet approved or cleared by the [place] FDA and  has been authorized for detection and/or diagnosis of SARS-CoV-2 by FDA under an Emergency Use Authorization (EUA). This EUA will remain  in effect (meaning this test can be used) for the duration of the COVID-19 declaration under Se ction 564(b)(1) of the Act, 21 U.S.C. section 360bbb-3(b)(1), unless the authorization is terminated or revoked sooner.  Performed at [facility], [place] [address], [place], [place] [address]      Patient was seen and examined on the day of discharge and was found to be in stable condition. Time coordinating discharge: 35 minutes including assessment and coordination of care, as well as examination of the patient.   SIGNED:  Dessa Phi, DO [facility] 12/26/2020, 10:26 AM

## 2020-12-30 ENCOUNTER — Encounter (HOSPITAL_COMMUNITY): Payer: Medicaid Other

## 2020-12-30 ENCOUNTER — Telehealth: Payer: Self-pay | Admitting: Family Medicine

## 2020-12-30 NOTE — Telephone Encounter (Signed)
   Demecia Northway DOB: 1971-04-15 MRN: [id number]   RIDER WAIVER AND RELEASE OF LIABILITY  For purposes of improving physical access to our facilities, [facility] is pleased to partner with third parties to provide [place] patients or other authorized individuals the option of convenient, on-demand ground transportation services (the [place]") through use of the technology service that enables users to request on-demand ground transportation from independent third-party providers.  By opting to use and accept these [employer], I, the undersigned, hereby agree on behalf of myself, and on behalf of any minor child using the Government social research officer for whom I am the parent or legal guardian, as follows:  Government social research officer provided to me are provided by independent third-party transportation providers who are not [employer] or employees and who are unaffiliated with [employer]. [place] is neither a transportation carrier nor a common or public carrier. [facility] has no control over the quality or safety of the transportation that occurs as a result of the [employer]. [facility] cannot guarantee that any third-party transportation provider will complete any arranged transportation service. [facility] makes no representation, warranty, or guarantee regarding the reliability, timeliness, quality, safety, suitability, or availability of any of the Transport Services or that they will be error free. I fully understand that traveling by vehicle involves risks and dangers of serious bodily injury, including permanent disability, paralysis, and death. I agree, on behalf of myself and on behalf of any minor child using the Transport Services for whom I am the parent or legal guardian, that the entire risk arising out of my use of the [employer] remains solely with me, to the maximum extent permitted under applicable law. The [employer] are provided "as  is" and "as available." [facility] disclaims all representations and warranties, express, implied or statutory, not expressly set out in these terms, including the implied warranties of merchantability and fitness for a particular purpose. I hereby waive and release [facility], its agents, employees, officers, directors, representatives, insurers, attorneys, assigns, successors, subsidiaries, and affiliates from any and all past, present, or future claims, demands, liabilities, actions, causes of action, or suits of any kind directly or indirectly arising from acceptance and use of the [employer]. I further waive and release [facility] and its affiliates from all present and future liability and responsibility for any injury or death to persons or damages to property caused by or related to the use of the [employer]. I have read this Waiver and Release of Liability, and I understand the terms used in it and their legal significance. This Waiver is freely and voluntarily given with the understanding that my right (as well as the right of any minor child for whom I am the parent or legal guardian using the [employer]) to legal recourse against [place] in connection with the [employer] is knowingly surrendered in return for use of these services.   I attest that I read the consent document to [name], gave Ms. Kamer the opportunity to ask questions and answered the questions asked (if any). I affirm that [name] then provided consent for she's participation in this program.     Katy Apo

## 2020-12-30 NOTE — Telephone Encounter (Signed)
Advanced Heart Failure Patient Advocate Encounter  Patient was approved to receive Entresto from [employer]  Patient ID: [id number] Effective dates: 12/25/20 through 12/25/21  The medication will be delivered tomorrow 6/23.  Advanced Heart Failure Patient Advocate Encounter   Patient was approved to receive Farxiga from [employer]  Patient ID: [id number] Effective dates: 12/29/20 through 12/28/2021  Attempted to call patient, call would not go through despite several attempts.  Charlann Boxer, CPhT

## 2020-12-31 LAB — ALDOSTERONE + RENIN ACTIVITY W/ RATIO
ALDO / PRA Ratio: <1.3 (ref 0.0–30.0)
Aldosterone: <1 ng/dL (ref 0.0–30.0)
PRA LC/MS/MS: 0.774 ng/mL/hr (ref 0.167–5.380)

## 2021-01-18 NOTE — Progress Notes (Signed)
Advanced Heart Failure Clinic Note   PCP: Azzie Glatter, FNP (Inactive) PCP-Cardiologist: None  HF Cardiologist: Dr. Haroldine Laws  HPI: 50 y/o AAF w/ h/o HTN and chronic systolic heart failure. In May 2016, she underwent left VATS w/ drainage of empyema/ abscess and decortication by Dr. Roxan Hockey. Echocardiogram showed normal EF 50-55%.   She was admitted again 02/2015 w/ gastroenteritis/ sepsis and HTN urgency. Cardiology consulted for elevated troponin. Echo showed mildly reduced LVEF 40-45%, RV normal. LHC showed normal coronaries. Drop in EF felt to be stress induced. Unfortunately, she did not f/u w/ cardiology following that hospitalization.   Presented to ED on 11/20/20 w/ worsening, intermittent CP and exertional dyspnea. She had stopped taking all of her BP meds ~4 months ago due to finances. In ED, BP 220/137. BNP 1290, Hs trop 35>>34. She was admitted and restarted on PO antihypertensives and IV Lasix. Echo showed severely reduced EF, now 20-25%, GIIIDD, no LVH, Global HK, RV mildly reduced, RVSP 60 mmHg, mod MR, Mod TR. cMRI showed severely reduced LVEF, moderately reduced RV, small pericardia effusion. Findings consistent with HTN heart disease and pulmonary HTN. She was started on GDMT. D/c weight 153 lbs.  Today she returns for post hospitalization HF follow up. Overall feeling tired. She has 4 pillow orthopnea/PND, +benopnea. SOB with exertion. Has some atypical CP. Denies increasing SOB, dizziness, edema.  Appetite ok. No fever or chills. Has not been weighing at home but does have scale. Taking all medications. Weight up 12 lbs since discharge.  Cardiac Studies   - Echo (5/16):  LVEF 50-55%, RV normal - Echo (8/16): LVEF 40-45%, AK of apical myocardium, G2DD, RV normal - Echo (6/20): LVEF 40-45%, RV mildly reduced - Echo (6/22): LVEF 20-25%, GIIIDD, no LVH, Global HK, RV mildly reduced, RVSP 60 mmHg, mod MR, Mod TR   - LHC (2016): normal coronaries   - R/LHC (6/16): Prox  LAD lesion is 45% stenosed.   Findings:   Ao = 177/94 (126) LV = 176/18 RA = 5 RV = 64/9 PA = 64/17 (35) PCW = 18 Fick cardiac output/index = 5.0/2.8 PVR = 3.4 WU Ao sat = 97% PA sat = 68%, 69%   Assessment: 1. Mild CAD 2. Severe NICM likely due to HTN CM 3. 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 Left-sided pressures relatively well compemsated.   - Renal artery u/s (6/22): no RAS or FMD   - cMRI (6/22): Severely reduced LV systolic function with moderate LV dilation, LVEF 22%. Global hypokinesis. Moderately reduced RV systolic function with mild RV enlargement. RVEF 35%. Delayed myocardial enhancement in the superior and inferior RV insertion points, with extension to the midmyocardium in the antero- and inferoseptum. May represent increased pulmartery pressure. Small circumferential pericardial effusion. Findings most consistent with hypertensive heart disease and pulmonary hypertension.  Review of Systems: [y] = yes, [ ]  = no   General: Weight gain Blue.Reese ]; Weight loss [ ] ; Anorexia Blue.Reese ]; Fatigue Blue.Reese ]; Fever [ ] ; Chills [ ] ; Weakness [ ]   Cardiac: Chest pain/pressure Blue.Reese ]; Resting SOB [ ] ; Exertional SOB Blue.Reese ]; Orthopnea Blue.Reese ]; Pedal Edema [ ] ; Palpitations [ ] ; Syncope [ ] ; Presyncope [ ] ; Paroxysmal nocturnal dyspnea[ ]   Pulmonary: Cough [ ] ; Wheezing[ ] ; Hemoptysis[ ] ; Sputum [ ] ; Snoring [ y]  GI: Vomiting[ ] ; Dysphagia[ ] ; Melena[ ] ; Hematochezia [ ] ; Heartburn[ ] ; Abdominal pain [ ] ; Constipation Blue.Reese ]; Diarrhea [ ] ; BRBPR [ ]   GU: Hematuria[ ] ; Dysuria [ ] ;  Nocturia[ ]   Vascular: Pain in legs with walking [ ] ; Pain in feet with lying flat [ ] ; Non-healing sores [ ] ; Stroke [ ] ; TIA [ ] ; Slurred speech [ ] ;  Neuro: Headaches[y ]; Vertigo[ ] ; Seizures[ ] ; Paresthesias[ ] ;Blurred vision [ ] ; Diplopia [ ] ; Vision changes [ ]   Ortho/Skin: Arthritis [ ] ; Joint pain [ ] ; Muscle pain [ ] ; Joint swelling [ ] ; Back Pain [ ] ; Rash [ ]   Psych: Depression[ ] ; Anxiety[ ]   Heme: Bleeding  problems [ ] ; Clotting disorders [ ] ; Anemia [ ]   Endocrine: Diabetes [ ] ; Thyroid dysfunction[ ]   Past Medical History:  Diagnosis Date   Cerumen impaction 01/2019   CHF (congestive heart failure) ([place])    after last pregnancy   Daily headache    Heart murmur    "born w/one":   History of hiatal hernia    Hyperlipidemia 01/2019   Hypertension    Hypertensive emergency 01/02/2019   Observed seizure-like activity ([place]) 01/02/2019   Pneumonia 11/2014   Seasonal allergies 01/2019   Vitamin D deficiency 01/2019   Current Outpatient Medications  Medication Sig Dispense Refill   carvedilol (COREG) 25 MG tablet Take 1 tablet (25 mg total) by mouth 2 (two) times daily with a meal. 60 tablet 5   dapagliflozin propanediol (FARXIGA) 10 MG TABS tablet Take 1 tablet (10 mg total) by mouth daily. 30 tablet 5   hydrALAZINE (APRESOLINE) 50 MG tablet Take 1 tablet (50 mg total) by mouth 3 (three) times daily. 90 tablet 5   isosorbide mononitrate (IMDUR) 30 MG 24 hr tablet Take 1 tablet (30 mg total) by mouth daily. 30 tablet 5   sacubitril-valsartan (ENTRESTO) 97-103 MG Take 1 tablet by mouth 2 (two) times daily. 60 tablet 5   spironolactone (ALDACTONE) 25 MG tablet Take 1 tablet (25 mg total) by mouth daily. 30 tablet 5   No current facility-administered medications for this encounter.   No Known Allergies  Social History   Socioeconomic History   Marital status: Divorced    Spouse name: Not on file   Number of children: Not on file   Years of education: Not on file   Highest education level: Not on file  Occupational History   Not on file  Tobacco Use   Smoking status: Former    Packs/day: 0.33    Years: 10.00    Pack years: 3.30    Types: Cigarettes    Quit date: 11/11/2014    Years since quitting: 6.1   Smokeless tobacco: Never  Substance and Sexual Activity   Alcohol use: Yes    Alcohol/week: 0.0 standard drinks    Comment: 8/1/20176 "a couple beers a couple times/month"    Drug use: No   Sexual activity: Yes  Other Topics Concern   Not on file  Social History Narrative   Not on file   Social Determinants of Health   Financial Resource Strain: High Risk   Difficulty of Paying Living Expenses: Hard  Food Insecurity: Food Insecurity Present   Worried About Running Out of Food in the Last Year: Sometimes true   Ran Out of Food in the Last Year: Sometimes true  Transportation Needs: Unmet Transportation Needs   Lack of Transportation (Medical): Yes   Lack of Transportation (Non-Medical): Yes  Physical Activity: Not on file  Stress: Not on file  Social Connections: Not on file  Intimate Partner Violence: Not on file   Family History  Problem Relation Age of Onset  Diabetes Mellitus II Mother    Hypertension Mother    Lung cancer Father     BP 140/78   Pulse (!) 57   Wt 75.1 kg (165 lb 9.6 oz)   SpO2 98%   BMI 25.94 kg/m   Wt Readings from Last 3 Encounters:  01/19/21 75.1 kg (165 lb 9.6 oz)  12/26/20 69.4 kg (153 lb 1.6 oz)  02/22/19 83.9 kg (185 lb)   PHYSICAL EXAM: General:  NAD. No resp difficulty HEENT: Normal Neck: Supple. No JVD. Carotids 2+ bilat; no bruits. No lymphadenopathy or thryomegaly appreciated. Cor: PMI nondisplaced. Regular rate & rhythm. No rubs, gallops or murmurs. Lungs: Clear Abdomen: Soft, nontender, nondistended. No hepatosplenomegaly. No bruits or masses. Good bowel sounds. Extremities: No cyanosis, clubbing, rash, edema Neuro: Alert & oriented x 3, cranial nerves grossly intact. Moves all 4 extremities w/o difficulty. Affect pleasant.  ECG: SB 55 bpm, qrs 122 ms (personally reviewed). Reds: 26%  ASSESSMENT & PLAN: Chronic Systolic Heart Failure with chest pressure  -NICM. LHC in 2016 showed normal coronaries - Echo 11/2014:  LVEF 50-55%, RV normal - Echo 02/2015: LVEF 40-45% (felt stress induced) AK of apical myocardium, G2DD, RV normal - Echo 12/2018: LVEF 40-45%, RV mildly reduced - EF now 20-25%, RV  mildly reduced, in the setting of poorly controlled HTN/ hypertensive emergency  - L/RHC (6/22) w/ mild CAD. Suspect CM 2/2 HTN, RHC after diureses showed well compensated left sided filling pressures - cMRI (6/22): LV EF 22% severely reduced, RVEF 35% moderately down, small circumferential pericardial effusion, findings consistent with hypertensive heart disease and pulmonary HTN. - NYHA III-early IIIb, volume stable. Continue lasix 40 mg prn for fluid overload. Will give Rx. - Continue Entresto 97-103 mg bid.  - Continue spironolactone 25 mg daily. - Continue Coreg 25 mg bid. HR 55 today. - Increase Hydralazine to 75 mg tid + Imdur 30 mg daily. - Continue Farxiga 10 mg daily (hgb A1c 5.7). - BMET today.   2. Hypertension - Poorly controlled in setting of poor med compliance. - Continue to titrate with goal SBP 130-150. See plan above.  - Will arrange for sleep study to r/o OSA. - PharmD to help with med access. - Will order her a BP cuff. Instructed to check daily and bring log to next appt.   3. 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   4. Pulmonary HTN - RVSP severely elevated on echo, 60 mHg. - Suspect primarily 2/2 left sided heart disease/ acute CHF w/ elevated filling pressures. 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 - Schedule sleep study.   5. Tobacco abuse - She has quit.    6. CAD - LAD 45%. - Mild CP with deep inspiration.  - Continue statin.   - PharmD assisting w/ med assistance for Entresto and Farxiga   - She is having daily headaches. I have asked her to try OTC acetaminophen. If not effective, will need to stop Imdur. She is agreeable.   - Follow up in 3 weeks with PharmD, 6 weeks with APP.  [place], FNP 01/19/21

## 2021-01-19 ENCOUNTER — Ambulatory Visit (HOSPITAL_COMMUNITY)
Admit: 2021-01-19 | Discharge: 2021-01-19 | Disposition: A | Discharge status: Home / Self Care | Payer: Medicaid Other | Attending: Family Medicine | Admitting: Family Medicine

## 2021-01-19 ENCOUNTER — Encounter (HOSPITAL_COMMUNITY): Payer: Self-pay

## 2021-01-19 ENCOUNTER — Other Ambulatory Visit: Payer: Self-pay

## 2021-01-19 VITALS — BP 140/78 | HR 57 | Wt 165.6 lb

## 2021-01-19 DIAGNOSIS — I272 Pulmonary hypertension, unspecified: Secondary | ICD-10-CM | POA: Insufficient documentation

## 2021-01-19 DIAGNOSIS — I2510 Atherosclerotic heart disease of native coronary artery without angina pectoris: Secondary | ICD-10-CM | POA: Insufficient documentation

## 2021-01-19 DIAGNOSIS — R0602 Shortness of breath: Secondary | ICD-10-CM | POA: Insufficient documentation

## 2021-01-19 DIAGNOSIS — I340 Nonrheumatic mitral (valve) insufficiency: Secondary | ICD-10-CM

## 2021-01-19 DIAGNOSIS — Z7984 Long term (current) use of oral hypoglycemic drugs: Secondary | ICD-10-CM | POA: Insufficient documentation

## 2021-01-19 DIAGNOSIS — R0683 Snoring: Secondary | ICD-10-CM | POA: Insufficient documentation

## 2021-01-19 DIAGNOSIS — I071 Rheumatic tricuspid insufficiency: Secondary | ICD-10-CM

## 2021-01-19 DIAGNOSIS — R0789 Other chest pain: Secondary | ICD-10-CM | POA: Insufficient documentation

## 2021-01-19 DIAGNOSIS — Z87891 Personal history of nicotine dependence: Secondary | ICD-10-CM | POA: Insufficient documentation

## 2021-01-19 DIAGNOSIS — R519 Headache, unspecified: Secondary | ICD-10-CM | POA: Insufficient documentation

## 2021-01-19 DIAGNOSIS — Z79899 Other long term (current) drug therapy: Secondary | ICD-10-CM | POA: Insufficient documentation

## 2021-01-19 DIAGNOSIS — Z720 Tobacco use: Secondary | ICD-10-CM

## 2021-01-19 DIAGNOSIS — Z9114 Patient's other noncompliance with medication regimen: Secondary | ICD-10-CM | POA: Insufficient documentation

## 2021-01-19 DIAGNOSIS — Z8249 Family history of ischemic heart disease and other diseases of the circulatory system: Secondary | ICD-10-CM | POA: Insufficient documentation

## 2021-01-19 DIAGNOSIS — Z09 Encounter for follow-up examination after completed treatment for conditions other than malignant neoplasm: Secondary | ICD-10-CM | POA: Insufficient documentation

## 2021-01-19 DIAGNOSIS — I5022 Chronic systolic (congestive) heart failure: Secondary | ICD-10-CM

## 2021-01-19 DIAGNOSIS — I428 Other cardiomyopathies: Secondary | ICD-10-CM | POA: Insufficient documentation

## 2021-01-19 DIAGNOSIS — I110 Hypertensive heart disease with heart failure: Secondary | ICD-10-CM | POA: Insufficient documentation

## 2021-01-19 DIAGNOSIS — Z7901 Long term (current) use of anticoagulants: Secondary | ICD-10-CM | POA: Insufficient documentation

## 2021-01-19 DIAGNOSIS — I10 Essential (primary) hypertension: Secondary | ICD-10-CM

## 2021-01-19 LAB — BASIC METABOLIC PANEL
Anion gap: 6 (ref 5–15)
BUN: 16 mg/dL (ref 6–20)
CO2: 29 mmol/L (ref 22–32)
Calcium: 9.6 mg/dL (ref 8.9–10.3)
Chloride: 101 mmol/L (ref 98–111)
Creatinine, Ser: 0.96 mg/dL (ref 0.44–1.00)
GFR, Estimated: >60 mL/min (ref >60)
Glucose, Bld: 94 mg/dL (ref 70–99)
Potassium: 4.4 mmol/L (ref 3.5–5.1)
Sodium: 136 mmol/L (ref 135–145)

## 2021-01-19 MED ORDER — FUROSEMIDE 20 MG PO TABS
40.0000 mg | ORAL_TABLET | ORAL | 11 refills | Status: DC | PRN
  Filled 2021-01-19: qty 30, 15d supply, fill #0

## 2021-01-19 MED ORDER — HYDRALAZINE HCL 50 MG PO TABS
75.0000 mg | ORAL_TABLET | Freq: Three times a day (TID) | ORAL | 5 refills | Status: DC
  Filled 2021-01-19: qty 135, 30d supply, fill #0

## 2021-01-19 NOTE — Patient Instructions (Signed)
INCREASE Hydralazine to 75 mg, (one and one-half tab) three times daily CHANGE Lasix 40 mg as needed for swelling or shortness of breath  Labs today We will only contact you if something comes back abnormal or we need to make some changes. Otherwise no news is good news!  Your physician has requested that you regularly monitor and record your blood pressure readings at home. Please use the same machine at the same time of day to check your readings and record them to bring to your follow-up visit.  Your physician has recommended that you have a sleep study. This test records several body functions during sleep, including: brain activity, eye movement, oxygen and carbon dioxide blood levels, heart rate and rhythm, breathing rate and rhythm, the flow of air through your mouth and nose, snoring, body muscle movements, and chest and belly movement.  Your physician recommends that you schedule a follow-up appointment in: 3 weeks with the pharmacy team and in 6  in the Advanced Practitioners (PA/NP) Clinic    Do the following things EVERYDAY: Weigh yourself in the morning before breakfast. Write it down and keep it in a log. Take your medicines as prescribed Eat low salt foods--Limit salt (sodium) to 2000 mg per day.  Stay as active as you can everyday Limit all fluids for the day to less than 2 liters  At the [facility], you and your health needs are our priority. As part of our continuing mission to provide you with exceptional heart care, we have created designated Provider Care Teams. These Care Teams include your primary Cardiologist (physician) and Advanced Practice Providers (APPs- Physician Assistants and Nurse Practitioners) who all work together to provide you with the care you need, when you need it.   You may see any of the following providers on your designated Care Team at your next follow up: Dr Glori Bickers Dr Loralie Champagne Dr Patrice Paradise,  NP Lyda Jester, [place] Ginnie Smart Audry Riles, PharmD   Please be sure to bring in all your medications bottles to every appointment.   If you have any questions or concerns before your next appointment please send [place] a message through [place] or call our office at [phone].    TO LEAVE A MESSAGE FOR THE NURSE SELECT OPTION 2, PLEASE LEAVE A MESSAGE INCLUDING: YOUR NAME DATE OF BIRTH CALL BACK NUMBER REASON FOR CALL**this is important as we prioritize the call backs  YOU WILL RECEIVE A CALL BACK THE SAME DAY AS LONG AS YOU CALL BEFORE 4:00 PM

## 2021-01-19 NOTE — Progress Notes (Signed)
ReDS Vest / Clip - 01/19/21 1100       ReDS Vest / Clip   Station Marker C    Ruler Value 29    ReDS Value Range Moderate volume overload    ReDS Actual Value 26    Anatomical Comments sitting

## 2021-01-19 NOTE — Progress Notes (Signed)
Patient Name: Emily Key        DOB: 07/12/1970      Height: 5'7"    Weight:165 lbs  Office Name:Advanced Heart Failure Clinic         Referring Provider: Allena Katz, NP/ Glori Bickers, MD  Today's Date:01/19/2021   STOP BANG RISK ASSESSMENT S (snore) Have you been told that you snore?     YES   T (tired) Are you often tired, fatigued, or sleepy during the day?   YES  O (obstruction) Do you stop breathing, choke, or gasp during sleep? YES   P (pressure) Do you have or are you being treated for high blood pressure? YES   B (BMI) Is your body index greater than 35 kg/m? NO   A (age) Are you 4 years old or older? YES   N (neck) Do you have a neck circumference greater than 16 inches?   NO   G (gender) Are you a female? NO   TOTAL STOP/BANG "YES" ANSWERS                                                                        For Office Use Only              Procedure Order Form    YES to 3+ Stop Bang questions OR two clinical symptoms - patient qualifies for WatchPAT (CPT 95800)             Clinical Notes: Will consult Sleep Specialist and refer for management of therapy due to patient increased risk of Sleep Apnea. Ordering a sleep study due to the following two clinical symptoms: Excessive daytime sleepiness G47.10 Loud snoring R06.83 History of high blood pressure R03.0 /   I understand that I am proceeding with a home sleep apnea test as ordered by my treating physician. I understand that untreated sleep apnea is a serious cardiovascular risk factor and it is my responsibility to perform the test and seek management for sleep apnea. I will be contacted with the results and be managed for sleep apnea by a local sleep physician. I will be receiving equipment and further instructions from [facility]. I shall promptly ship back the equipment via the included mailing label. I understand my insurance will be billed for the test and as the patient I am responsible for any  insurance related out-of-pocket costs incurred. I have been provided with written instructions and can call for additional video or telephonic instruction, with 24-hour availability of qualified personnel to answer any questions: Patient Help Desk [phone].  Patient Signature ______________________________________________________   Date______________________ Patient Telemedicine Verbal Consent

## 2021-01-20 ENCOUNTER — Telehealth (HOSPITAL_COMMUNITY): Payer: Self-pay | Admitting: Licensed Clinical Social Worker

## 2021-01-20 NOTE — Telephone Encounter (Signed)
CSW referred to assist patient with obtaining a BP cuff. CSW contacted patient to inform cuff will be delivered to home although no answer and unable to leave message. CSW available as needed. Raquel Sarna, [place], [place]

## 2021-01-26 ENCOUNTER — Other Ambulatory Visit: Payer: Self-pay

## 2021-02-05 ENCOUNTER — Other Ambulatory Visit: Payer: Self-pay

## 2021-02-05 ENCOUNTER — Encounter: Payer: Self-pay | Admitting: Nurse Practitioner

## 2021-02-05 ENCOUNTER — Ambulatory Visit (INDEPENDENT_AMBULATORY_CARE_PROVIDER_SITE_OTHER): Payer: Medicaid Other | Admitting: Nurse Practitioner

## 2021-02-05 VITALS — BP 168/104 | HR 83 | Temp 97.4°F | Ht 68.0 in | Wt 173.0 lb

## 2021-02-05 DIAGNOSIS — Z13220 Encounter for screening for lipoid disorders: Secondary | ICD-10-CM

## 2021-02-05 DIAGNOSIS — R569 Unspecified convulsions: Secondary | ICD-10-CM

## 2021-02-05 DIAGNOSIS — I272 Pulmonary hypertension, unspecified: Secondary | ICD-10-CM

## 2021-02-05 DIAGNOSIS — I5022 Chronic systolic (congestive) heart failure: Secondary | ICD-10-CM

## 2021-02-05 DIAGNOSIS — I429 Cardiomyopathy, unspecified: Secondary | ICD-10-CM

## 2021-02-05 DIAGNOSIS — I10 Essential (primary) hypertension: Secondary | ICD-10-CM

## 2021-02-05 DIAGNOSIS — I160 Hypertensive urgency: Secondary | ICD-10-CM

## 2021-02-05 DIAGNOSIS — F32A Depression, unspecified: Secondary | ICD-10-CM

## 2021-02-05 LAB — POCT URINALYSIS DIPSTICK
Bilirubin, UA: NEGATIVE
Blood, UA: NEGATIVE
Glucose, UA: POSITIVE — AB
Ketones, UA: NEGATIVE
Leukocytes, UA: NEGATIVE
Nitrite, UA: NEGATIVE
Protein, UA: NEGATIVE
Spec Grav, UA: 1.02 (ref 1.010–1.025)
Urobilinogen, UA: 0.2 E.U./dL
pH, UA: 6.5 (ref 5.0–8.0)

## 2021-02-05 MED ORDER — CLONIDINE HCL 0.1 MG PO TABS
0.3000 mg | ORAL_TABLET | Freq: Once | ORAL | Status: AC
  Administered 2021-02-05: 0.3 mg via ORAL

## 2021-02-05 MED ORDER — SPIRONOLACTONE 25 MG PO TABS
25.0000 mg | ORAL_TABLET | Freq: Every day | ORAL | 5 refills | Status: DC
  Filled 2021-02-05: qty 30, 30d supply, fill #0
  Filled 2021-08-27: qty 30, 30d supply, fill #1
  Filled 2021-08-27: qty 30, 30d supply, fill #0

## 2021-02-05 MED ORDER — HYDRALAZINE HCL 50 MG PO TABS
75.0000 mg | ORAL_TABLET | Freq: Three times a day (TID) | ORAL | 5 refills | Status: DC
  Filled 2021-02-05: qty 135, 30d supply, fill #0

## 2021-02-05 MED ORDER — CARVEDILOL 25 MG PO TABS
25.0000 mg | ORAL_TABLET | Freq: Two times a day (BID) | ORAL | 5 refills | Status: DC
  Filled 2021-02-05: qty 60, 30d supply, fill #0
  Filled 2021-08-27: qty 60, 30d supply, fill #1
  Filled 2021-08-27: qty 60, 30d supply, fill #0

## 2021-02-05 MED ORDER — CITALOPRAM HYDROBROMIDE 20 MG PO TABS
20.0000 mg | ORAL_TABLET | Freq: Every day | ORAL | 3 refills | Status: DC
Start: 2021-02-05 — End: 2021-11-25
  Filled 2021-02-05 – 2021-08-27 (×2): qty 30, 30d supply, fill #0
  Filled 2021-08-27: qty 30, 30d supply, fill #1

## 2021-02-05 MED ORDER — FUROSEMIDE 20 MG PO TABS
40.0000 mg | ORAL_TABLET | ORAL | 11 refills | Status: DC | PRN
  Filled 2021-02-05: qty 30, 15d supply, fill #0
  Filled 2021-08-27: qty 30, 15d supply, fill #1
  Filled 2021-08-27: qty 60, 30d supply, fill #0

## 2021-02-05 MED ORDER — SACUBITRIL-VALSARTAN 97-103 MG PO TABS
1.0000 | ORAL_TABLET | Freq: Two times a day (BID) | ORAL | 5 refills | Status: DC
  Filled 2021-02-05: qty 60, 30d supply, fill #0

## 2021-02-05 MED ORDER — ISOSORBIDE MONONITRATE ER 30 MG PO TB24
30.0000 mg | ORAL_TABLET | Freq: Every day | ORAL | 5 refills | Status: DC
  Filled 2021-02-05: qty 30, 30d supply, fill #0
  Filled 2021-08-27: qty 30, 30d supply, fill #1
  Filled 2021-08-27: qty 30, 30d supply, fill #0

## 2021-02-05 MED ORDER — DAPAGLIFLOZIN PROPANEDIOL 10 MG PO TABS
10.0000 mg | ORAL_TABLET | Freq: Every day | ORAL | 5 refills | Status: DC
  Filled 2021-02-05: qty 30, 30d supply, fill #0

## 2021-02-05 NOTE — Progress Notes (Signed)
Dent Screening    02/05/2021 Name: Emily Key MRN: [id number] DOB: 02-17-1971 Emily Key is a 50 y.o. year old female who sees Vevelyn Francois, NP for primary care. LCSW was consulted to assess mental health needs and assist the patient with Mental Health Counseling and Resources. Assessed thoughts of SI, plan and access to means.  Interpreter: No.   Interpreter Name & Language: none  SUBJECTIVE: Presenting issue / symptoms/concerns: depression, thoughts that family would be better off without her Duration of symptoms/ how impacting: a few months Recent life changes: health condition that prevents her from working Family / Social support: lives with sister  Mood: Depressed Affect: Tearful  OBJECTIVE:  Psychiatric History - Diagnoses: Depression - Hospitalizations/ prior attempts: none - Pharmacotherapy: celexa - Outpatient therapy: none Family history of psychiatric issues: not known Current and history of substance use: not known  PHQ-9 score of 13 is an indication of moderate depression. Depression screen [facility] 2/9 02/05/2021 02/22/2019 01/21/2019  Decreased Interest 1 0 0  Down, Depressed, Hopeless '1 1 2  '$ PHQ - 2 Score '2 1 2  '$ Altered sleeping '3 2 1  '$ Tired, decreased energy 2 0 1  Change in appetite '1 1 1  '$ Feeling bad or failure about yourself  3 0 1  Trouble concentrating 1 0 0  Moving slowly or fidgety/restless 0 0 0  Suicidal thoughts 1 0 0  PHQ-9 Score '13 4 6     '$ No flowsheet data found.  Outpatient Encounter Medications as of 02/05/2021  Medication Sig Note   carvedilol (COREG) 25 MG tablet Take 1 tablet (25 mg total) by mouth 2 (two) times daily with a meal.    citalopram (CELEXA) 20 MG tablet Take 1 tablet (20 mg total) by mouth daily.    dapagliflozin propanediol (FARXIGA) 10 MG TABS tablet Take 1 tablet (10 mg total) by mouth daily.    furosemide (LASIX) 20 MG tablet Take 2 tablets (40 mg total) by mouth as needed for  fluid or edema. 02/05/2021: As needed.   hydrALAZINE (APRESOLINE) 50 MG tablet Take 1.5 tablets (75 mg total) by mouth 3 (three) times daily.    isosorbide mononitrate (IMDUR) 30 MG 24 hr tablet Take 1 tablet (30 mg total) by mouth daily.    sacubitril-valsartan (ENTRESTO) 97-103 MG Take 1 tablet by mouth 2 (two) times daily.    spironolactone (ALDACTONE) 25 MG tablet Take 1 tablet (25 mg total) by mouth daily.    [EXPIRED] cloNIDine (CATAPRES) tablet 0.3 mg     No facility-administered encounter medications on file as of 02/05/2021.    Review of patient status, including review of consultants reports, relevant laboratory and other test results, and collaboration with appropriate care team members and the patient's provider was performed as part of comprehensive patient evaluation and provision of services.    Assessment: Patient is currently experiencing symptoms of depression which are exacerbated by health conditions and change in ability to work. Patient now lives with her sister and relies on family, as she does not have income from work. Patient reports thoughts that family would be better off if she was not here. She denies a plan for suicide.   Recommendation: Patient may benefit from, and is in agreement to counseling.   Intervention:Patient interviewed and appropriate assessments performed: brief mental health assessment Referred patient to [facility] (mental health provider) for long term follow up and therapy/counseling CSW provided brief supportive counseling today. Provided patient with  mental health crisis resources: [facility]) [phone], [facility] [phone], National Suicide Prevention Lifeline 651 563 9021 or 7476452172, and crisis text line [phone]. Made appointment to follow up with patient on 02/08/21. PCP to place a referral to [facility]) for outpatient  counseling.   SDOH (Social Determinants of Health) assessments performed: No - informal assessment - patient reported she is awaiting food stamps determination, uses Research officer, trade union services SDOH Interventions    Flowsheet Row Most Recent Value  SDOH Interventions   Depression Interventions/Treatment  Counseling        Goals Addressed   None     Estanislado Emms, [facility] [phone]

## 2021-02-05 NOTE — Progress Notes (Signed)
[place], [place]  [address] Phone:  [phone]   Fax:  [phone]   Established Patient Office Visit  Subjective:  Patient ID: Emily Key, female    DOB: 1971/03/18  Age: 50 y.o. MRN: [id number]  CC:  Chief Complaint  Patient presents with  . Hospitalization Follow-up    12/21/2020 - 12/26/2020 hypertension, reestablish care    HPI Annalei Massaquoi presents for folllow up. A former patient of NP Stroud. She  has a past medical history of Cerumen impaction (01/2019), CHF (congestive heart failure) ([place]), Daily headache, Heart murmur, History of hiatal hernia, Hyperlipidemia (01/2019), Hypertension, Hypertensive emergency (01/02/2019), Observed seizure-like activity ([place]) (01/02/2019), Pneumonia (11/2014), Seasonal allergies (01/2019), and Vitamin D deficiency (01/2019).    She was hospitalized December 22, 2018 to be acute on chronic congestive heart failure and uncontrolled hypertension.  She was experiencing chest pain and dyspnea on exertion.  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  She also completed a cardiac MRI.  She did have adjustment in her medication regimen.  She is monitoring her BP at home these are her results 156-160/100  She reports headaches and dizziness, dyspnea on exertion chest with lying on the left side and with walking. She reports that she is scheduled for a sleep study. She is not in cardiac rehab.  She has noticed that her chest feels heavy. She denies any swelling in her leg   She is very tearful and depressed about her current condition.  She reports that she is the oldest person in her family and is supposed to be there for her family.  Discussed her increased stress.  She is currently living with her sister because she was taken out of  work.  She does have disability paperwork started.  She is going through financial strain.   Past Medical History:  Diagnosis Date  . Cerumen impaction 01/2019  . CHF (congestive heart failure) ([place])    after last pregnancy  . Daily headache   . Heart murmur    "born w/one":  . History of hiatal hernia   . Hyperlipidemia 01/2019  . Hypertension   . Hypertensive emergency 01/02/2019  . Observed seizure-like activity ([place]) 01/02/2019  . Pneumonia 11/2014  . Seasonal allergies 01/2019  . Vitamin D deficiency 01/2019    Past Surgical History:  Procedure Laterality Date  . CARDIAC CATHETERIZATION N/A 02/13/2015   Procedure: Left Heart Cath and Coronary Angiography;  Surgeon: Troy Sine, MD;  Location: [facility];  Service: Cardiovascular;  Laterality: N/A;  . PLEURAL EFFUSION DRAINAGE Left 11/14/2014   Procedure: DRAINAGE OF LEFT PLEURAL EFFUSION;  Surgeon: Melrose Nakayama, MD;  Location: [facility];  Service: Thoracic;  Laterality: Left;  . RIGHT/LEFT HEART CATH AND CORONARY ANGIOGRAPHY N/A 12/24/2020   Procedure: RIGHT/LEFT HEART CATH AND CORONARY ANGIOGRAPHY;  Surgeon: Jolaine Artist, MD;  Location: [facility];  Service: Cardiovascular;  Laterality: N/A;  . TONSILLECTOMY AND ADENOIDECTOMY  ~ 1986  . TUBAL LIGATION  2000  . VIDEO ASSISTED THORACOSCOPY (VATS)/DECORTICATION Left 11/14/2014   Procedure: LEFT VIDEO ASSISTED THORACOSCOPY (VATS)/DECORTICATION;  Surgeon: Melrose Nakayama, MD;  Location: [facility];  Service: Thoracic;  Laterality: Left;    Family History  Problem Relation Age of Onset  . Diabetes Mellitus II Mother   . Hypertension Mother   .  Lung cancer Father     Social History   Socioeconomic History  . Marital status: Divorced    Spouse name: Not on file  . Number of children: Not on file  . Years of education: Not on file  . Highest education level: Not on file  Occupational History  . Not on file  Tobacco Use  . Smoking status: Former     Packs/day: 0.33    Years: 10.00    Pack years: 3.30    Types: Cigarettes    Quit date: 11/11/2014    Years since quitting: 6.2  . Smokeless tobacco: Never  Substance and Sexual Activity  . Alcohol use: Yes    Alcohol/week: 0.0 standard drinks    Comment: 8/1/20176 "a couple beers a couple times/month"  . Drug use: No  . Sexual activity: Yes  Other Topics Concern  . Not on file  Social History Narrative  . Not on file   Social Determinants of Health   Financial Resource Strain: High Risk  . Difficulty of Paying Living Expenses: Hard  Food Insecurity: Food Insecurity Present  . Worried About Charity fundraiser in the Last Year: Sometimes true  . Ran Out of Food in the Last Year: Sometimes true  Transportation Needs: Unmet Transportation Needs  . Lack of Transportation (Medical): Yes  . Lack of Transportation (Non-Medical): Yes  Physical Activity: Not on file  Stress: Not on file  Social Connections: Not on file  Intimate Partner Violence: Not on file    Outpatient Medications Prior to Visit  Medication Sig Dispense Refill  . carvedilol (COREG) 25 MG tablet Take 1 tablet (25 mg total) by mouth 2 (two) times daily with a meal. 60 tablet 5  . dapagliflozin propanediol (FARXIGA) 10 MG TABS tablet Take 1 tablet (10 mg total) by mouth daily. 30 tablet 5  . furosemide (LASIX) 20 MG tablet Take 2 tablets (40 mg total) by mouth as needed for fluid or edema. 30 tablet 11  . hydrALAZINE (APRESOLINE) 50 MG tablet Take 1.5 tablets (75 mg total) by mouth 3 (three) times daily. 135 tablet 5  . isosorbide mononitrate (IMDUR) 30 MG 24 hr tablet Take 1 tablet (30 mg total) by mouth daily. 30 tablet 5  . sacubitril-valsartan (ENTRESTO) 97-103 MG Take 1 tablet by mouth 2 (two) times daily. 60 tablet 5  . spironolactone (ALDACTONE) 25 MG tablet Take 1 tablet (25 mg total) by mouth daily. 30 tablet 5   No facility-administered medications prior to visit.    No Known Allergies  ROS Review  of Systems    Objective:    Physical Exam Constitutional:      Appearance: She is normal weight.  HENT:     Head: Normocephalic and atraumatic.     Nose: Nose normal.     Mouth/Throat:     Mouth: Mucous membranes are moist.  Cardiovascular:     Rate and Rhythm: Normal rate.     Pulses: Normal pulses.     Heart sounds: Normal heart sounds.  Pulmonary:     Effort: Pulmonary effort is normal.     Breath sounds: Normal breath sounds.  Abdominal:     General: Bowel sounds are normal.     Palpations: Abdomen is soft.  Musculoskeletal:        General: Normal range of motion.     Cervical back: Normal range of motion.     Right lower leg: No edema.     Left lower leg:  No edema.  Skin:    General: Skin is warm.     Capillary Refill: Capillary refill takes less than 2 seconds.  Neurological:     General: No focal deficit present.     Mental Status: She is alert and oriented to person, place, and time.  Psychiatric:        Mood and Affect: Mood normal.        Behavior: Behavior normal.        Thought Content: Thought content normal.        Judgment: Judgment normal.    BP (!) 168/104   Pulse 83   Temp (!) 97.4 F (36.3 C)   Ht '5\' 8"'$  (1.727 m)   Wt 173 lb 0.6 oz (78.5 kg)   SpO2 100%   BMI 26.31 kg/m  Wt Readings from Last 3 Encounters:  02/05/21 173 lb 0.6 oz (78.5 kg)  01/19/21 165 lb 9.6 oz (75.1 kg)  12/26/20 153 lb 1.6 oz (69.4 kg)     Health Maintenance Due  Topic Date Due  . COVID-19 Vaccine (1) Never done  . TETANUS/TDAP  Never done  . PAP SMEAR-Modifier  Never done  . COLONOSCOPY (Pts 45-60yr Insurance coverage will need to be confirmed)  Never done  . MAMMOGRAM  Never done  . Zoster Vaccines- Shingrix (1 of 2) Never done    There are no preventive care reminders to display for this patient.  Lab Results  Component Value Date   TSH 0.873 01/21/2019   Lab Results  Component Value Date   WBC 6.0 12/24/2020   HGB 13.0 12/24/2020   HCT 41.7  12/24/2020   MCV 85.3 12/24/2020   PLT 211 12/24/2020   Lab Results  Component Value Date   NA 136 01/19/2021   K 4.4 01/19/2021   CO2 29 01/19/2021   GLUCOSE 94 01/19/2021   BUN 16 01/19/2021   CREATININE 0.96 01/19/2021   BILITOT 1.3 (H) 12/21/2020   ALKPHOS 61 12/21/2020   AST 25 12/21/2020   ALT 21 12/21/2020   PROT 6.5 12/21/2020   ALBUMIN 3.6 12/21/2020   CALCIUM 9.6 01/19/2021   ANIONGAP 6 01/19/2021   Lab Results  Component Value Date   CHOL 220 (H) 01/21/2019   Lab Results  Component Value Date   HDL 72 01/21/2019   Lab Results  Component Value Date   LDLCALC 126 (H) 01/21/2019   Lab Results  Component Value Date   TRIG 109 01/21/2019   Lab Results  Component Value Date   CHOLHDL 3.1 01/21/2019   Lab Results  Component Value Date   HGBA1C 5.7 (H) 01/02/2019      Assessment & Plan:   Problem List Items Addressed This Visit       Cardiovascular and Mediastinum   Hypertensive urgency Persistent BP remains uncontrolled at home despite compliance with regimen.    Relevant Medications   carvedilol (COREG) 25 MG tablet   furosemide (LASIX) 20 MG tablet   hydrALAZINE (APRESOLINE) 50 MG tablet   isosorbide mononitrate (IMDUR) 30 MG 24 hr tablet   sacubitril-valsartan (ENTRESTO) 97-103 MG   spironolactone (ALDACTONE) 25 MG tablet   Cardiomyopathy (HCC) Follow up with cardiology    Relevant Medications   carvedilol (COREG) 25 MG tablet   furosemide (LASIX) 20 MG tablet   hydrALAZINE (APRESOLINE) 50 MG tablet   isosorbide mononitrate (IMDUR) 30 MG 24 hr tablet   sacubitril-valsartan (ENTRESTO) 97-103 MG   spironolactone (ALDACTONE) 25 MG tablet  Other   Seizure ([place]) Stable no seizure activity in the last 2 yrs   Other Visit Diagnoses     Essential hypertension    -  Primary Uncontrolled  Encouraged on going compliance with current medication regimen Encouraged home monitoring and recording Eating a heart-healthy diet with less  salt Encouraged regular physical activity     Relevant Medications   cloNIDine (CATAPRES) tablet 0.3 mg (Completed)   carvedilol (COREG) 25 MG tablet   furosemide (LASIX) 20 MG tablet   hydrALAZINE (APRESOLINE) 50 MG tablet   isosorbide mononitrate (IMDUR) 30 MG 24 hr tablet   sacubitril-valsartan (ENTRESTO) 97-103 MG   spironolactone (ALDACTONE) 25 MG tablet   Other Relevant Orders   Comp. Metabolic Panel (12) (Completed)   Urinalysis Dipstick (Completed)   Depression, unspecified depression type     Worsening Trial Citalopram 20 mg 2 week Fu Referral to clinical social worker for counseling    Relevant Medications   citalopram (CELEXA) 20 MG tablet   Other Relevant Orders   Ambulatory referral to Psychiatry   Screening for cholesterol level       Relevant Orders   Lipid panel (Completed)   Pulmonary hypertension ([place])     Persistent  Continue to follow up with cardiology   Relevant Medications   cloNIDine (CATAPRES) tablet 0.3 mg (Completed)   carvedilol (COREG) 25 MG tablet   furosemide (LASIX) 20 MG tablet   hydrALAZINE (APRESOLINE) 50 MG tablet   isosorbide mononitrate (IMDUR) 30 MG 24 hr tablet   sacubitril-valsartan (ENTRESTO) 97-103 MG   spironolactone (ALDACTONE) 25 MG tablet   Chronic systolic heart failure (HCC)     Persistent Encouraged daily weights Heart healthy diet Following guidelines in place for individual cardiac path   Relevant Medications   cloNIDine (CATAPRES) tablet 0.3 mg (Completed)   carvedilol (COREG) 25 MG tablet   furosemide (LASIX) 20 MG tablet   hydrALAZINE (APRESOLINE) 50 MG tablet   isosorbide mononitrate (IMDUR) 30 MG 24 hr tablet   sacubitril-valsartan (ENTRESTO) 97-103 MG   spironolactone (ALDACTONE) 25 MG tablet       Meds ordered this encounter  Medications  . cloNIDine (CATAPRES) tablet 0.3 mg  . citalopram (CELEXA) 20 MG tablet    Sig: Take 1 tablet (20 mg total) by mouth daily.    Dispense:  30 tablet    Refill:  3     Order Specific Question:   Supervising Provider    Answer:   Tresa Garter [id number]  . carvedilol (COREG) 25 MG tablet    Sig: Take 1 tablet (25 mg total) by mouth 2 (two) times daily with a meal.    Dispense:  60 tablet    Refill:  5    Order Specific Question:   Supervising Provider    Answer:   Tresa Garter [id number]  . dapagliflozin propanediol (FARXIGA) 10 MG TABS tablet    Sig: Take 1 tablet (10 mg total) by mouth daily.    Dispense:  30 tablet    Refill:  5    Order Specific Question:   Supervising Provider    Answer:   Tresa Garter [id number]  . furosemide (LASIX) 20 MG tablet    Sig: Take 2 tablets (40 mg total) by mouth as needed for fluid or edema.    Dispense:  30 tablet    Refill:  11    Order Specific Question:   Supervising Provider    Answer:   Tresa Garter [id number]  .  hydrALAZINE (APRESOLINE) 50 MG tablet    Sig: Take 1.5 tablets (75 mg total) by mouth 3 (three) times daily.    Dispense:  135 tablet    Refill:  5    Order Specific Question:   Supervising Provider    Answer:   Tresa Garter [id number]  . isosorbide mononitrate (IMDUR) 30 MG 24 hr tablet    Sig: Take 1 tablet (30 mg total) by mouth daily.    Dispense:  30 tablet    Refill:  5    Order Specific Question:   Supervising Provider    Answer:   Tresa Garter [id number]  . sacubitril-valsartan (ENTRESTO) 97-103 MG    Sig: Take 1 tablet by mouth 2 (two) times daily.    Dispense:  60 tablet    Refill:  5    Order Specific Question:   Supervising Provider    Answer:   Tresa Garter [id number]  . spironolactone (ALDACTONE) 25 MG tablet    Sig: Take 1 tablet (25 mg total) by mouth daily.    Dispense:  30 tablet    Refill:  5    Order Specific Question:   Supervising Provider    Answer:   Tresa Garter [id number]    Follow-up: No follow-ups on file.    Vevelyn Francois, NP

## 2021-02-05 NOTE — Patient Instructions (Signed)
Managing Your Hypertension Hypertension, also called high blood pressure, is when the force of the blood pressing against the walls of the arteries is too strong. Arteries are blood vessels that carry blood from your heart throughout your body. Hypertension forces the heart to work harder to pump blood and may cause the arteries tobecome narrow or stiff. Understanding blood pressure readings Your personal target blood pressure may vary depending on your medical conditions, your age, and other factors. A blood pressure reading includes a higher number over a lower number. Ideally, your blood pressure should be below 120/80. You should know that: The first, or top, number is called the systolic pressure. It is a measure of the pressure in your arteries as your heart beats. The second, or bottom number, is called the diastolic pressure. It is a measure of the pressure in your arteries as the heart relaxes. Blood pressure is classified into four stages. Based on your blood pressure reading, your health care provider may use the following stages to determine what type of treatment you need, if any. Systolic pressure and diastolicpressure are measured in a unit called mmHg. Normal Systolic pressure: below 120. Diastolic pressure: below 80. Elevated Systolic pressure: 120-129. Diastolic pressure: below 80. Hypertension stage 1 Systolic pressure: 130-139. Diastolic pressure: 80-89. Hypertension stage 2 Systolic pressure: 140 or above. Diastolic pressure: 90 or above. How can this condition affect me? Managing your hypertension is an important responsibility. Over time, hypertension can damage the arteries and decrease blood flow to important parts of the body, including the brain, heart, and kidneys. Having untreated or uncontrolled hypertension can lead to: A heart attack. A stroke. A weakened blood vessel (aneurysm). Heart failure. Kidney damage. Eye damage. Metabolic syndrome. Memory and  concentration problems. Vascular dementia. What actions can I take to manage this condition? Hypertension can be managed by making lifestyle changes and possibly by taking medicines. Your health care provider will help you make a plan to bring yourblood pressure within a normal range. Nutrition  Eat a diet that is high in fiber and potassium, and low in salt (sodium), added sugar, and fat. An example eating plan is called the Dietary Approaches to Stop Hypertension (DASH) diet. To eat this way: Eat plenty of fresh fruits and vegetables. Try to fill one-half of your plate at each meal with fruits and vegetables. Eat whole grains, such as whole-wheat pasta, brown rice, or whole-grain bread. Fill about one-fourth of your plate with whole grains. Eat low-fat dairy products. Avoid fatty cuts of meat, processed or cured meats, and poultry with skin. Fill about one-fourth of your plate with lean proteins such as fish, chicken without skin, beans, eggs, and tofu. Avoid pre-made and processed foods. These tend to be higher in sodium, added sugar, and fat. Reduce your daily sodium intake. Most people with hypertension should eat less than 1,500 mg of sodium a day.  Lifestyle  Work with your health care provider to maintain a healthy body weight or to lose weight. Ask what an ideal weight is for you. Get at least 30 minutes of exercise that causes your heart to beat faster (aerobic exercise) most days of the week. Activities may include walking, swimming, or biking. Include exercise to strengthen your muscles (resistance exercise), such as weight lifting, as part of your weekly exercise routine. Try to do these types of exercises for 30 minutes at least 3 days a week. Do not use any products that contain nicotine or tobacco, such as cigarettes, e-cigarettes, and chewing   tobacco. If you need help quitting, ask your health care provider. Control any long-term (chronic) conditions you have, such as high  cholesterol or diabetes. Identify your sources of stress and find ways to manage stress. This may include meditation, deep breathing, or making time for fun activities.  Alcohol use Do not drink alcohol if: Your health care provider tells you not to drink. You are pregnant, may be pregnant, or are planning to become pregnant. If you drink alcohol: Limit how much you use to: 0-1 drink a day for women. 0-2 drinks a day for men. Be aware of how much alcohol is in your drink. In the [place] one drink equals one 12 oz bottle of beer (355 mL), one 5 oz glass of wine (148 mL), or one 1 oz glass of hard liquor (44 mL). Medicines Your health care provider may prescribe medicine if lifestyle changes are not enough to get your blood pressure under control and if: Your systolic blood pressure is 130 or higher. Your diastolic blood pressure is 80 or higher. Take medicines only as told by your health care provider. Follow the directions carefully. Blood pressure medicines must be taken as told by your health care provider. The medicine does not work as well when you skip doses. Skippingdoses also puts you at risk for problems. Monitoring Before you monitor your blood pressure: Do not smoke, drink caffeinated beverages, or exercise within 30 minutes before taking a measurement. Use the bathroom and empty your bladder (urinate). Sit quietly for at least 5 minutes before taking measurements. Monitor your blood pressure at home as told by your health care provider. To do this: Sit with your back straight and supported. Place your feet flat on the floor. Do not cross your legs. Support your arm on a flat surface, such as a table. Make sure your upper arm is at heart level. Each time you measure, take two or three readings one minute apart and record the results. You may also need to have your blood pressure checked regularly by your healthcare provider. General information Talk with your health care  provider about your diet, exercise habits, and other lifestyle factors that may be contributing to hypertension. Review all the medicines you take with your health care provider because there may be side effects or interactions. Keep all visits as told by your health care provider. Your health care provider can help you create and adjust your plan for managing your high blood pressure. Where to find more information National Heart, Lung, and Blood Institute: www.nhlbi.nih.gov American Heart Association: www.heart.org Contact a health care provider if: You think you are having a reaction to medicines you have taken. You have repeated (recurrent) headaches. You feel dizzy. You have swelling in your ankles. You have trouble with your vision. Get help right away if: You develop a severe headache or confusion. You have unusual weakness or numbness, or you feel faint. You have severe pain in your chest or abdomen. You vomit repeatedly. You have trouble breathing. These symptoms may represent a serious problem that is an emergency. Do not wait to see if the symptoms will go away. Get medical help right away. Call your local emergency services (911 in the [place] Do not drive yourself to the hospital. Summary Hypertension is when the force of blood pumping through your arteries is too strong. If this condition is not controlled, it may put you at risk for serious complications. Your personal target blood pressure may vary depending on your medical conditions,   your age, and other factors. For most people, a normal blood pressure is less than 120/80. Hypertension is managed by lifestyle changes, medicines, or both. Lifestyle changes to help manage hypertension include losing weight, eating a healthy, low-sodium diet, exercising more, stopping smoking, and limiting alcohol. This information is not intended to replace advice given to you by your health care provider. Make sure you discuss any questions  you have with your healthcare provider. Document Revised: 08/02/2019 Document Reviewed: 05/28/2019 Elsevier Patient Education  2022 Elsevier Inc.  

## 2021-02-06 LAB — LIPID PANEL
Chol/HDL Ratio: 2.7 ratio (ref 0.0–4.4)
Cholesterol, Total: 223 mg/dL — ABNORMAL HIGH (ref 100–199)
HDL: 82 mg/dL (ref >39)
LDL Chol Calc (NIH): 127 mg/dL — ABNORMAL HIGH (ref 0–99)
Triglycerides: 79 mg/dL (ref 0–149)
VLDL Cholesterol Cal: 14 mg/dL (ref 5–40)

## 2021-02-06 LAB — COMP. METABOLIC PANEL (12)
AST: 31 IU/L (ref 0–40)
Albumin/Globulin Ratio: 1.3 (ref 1.2–2.2)
Albumin: 4.2 g/dL (ref 3.8–4.9)
Alkaline Phosphatase: 65 IU/L (ref 44–121)
BUN/Creatinine Ratio: 23 (ref 9–23)
BUN: 22 mg/dL (ref 6–24)
Bilirubin Total: 0.3 mg/dL (ref 0.0–1.2)
Calcium: 9.9 mg/dL (ref 8.7–10.2)
Chloride: 100 mmol/L (ref 96–106)
Creatinine, Ser: 0.95 mg/dL (ref 0.57–1.00)
Globulin, Total: 3.2 g/dL (ref 1.5–4.5)
Glucose: 105 mg/dL — ABNORMAL HIGH (ref 65–99)
Potassium: 4.9 mmol/L (ref 3.5–5.2)
Sodium: 139 mmol/L (ref 134–144)
Total Protein: 7.4 g/dL (ref 6.0–8.5)
eGFR: 73 mL/min/{1.73_m2} (ref >59)

## 2021-02-07 ENCOUNTER — Encounter: Payer: Self-pay | Admitting: Nurse Practitioner

## 2021-02-08 ENCOUNTER — Other Ambulatory Visit: Payer: Self-pay

## 2021-02-08 ENCOUNTER — Other Ambulatory Visit: Payer: Medicaid Other | Admitting: Clinical

## 2021-02-08 NOTE — Progress Notes (Signed)
PCP: Azzie Glatter, FNP (Inactive) PCP-Cardiologist: None  HF Cardiologist: Dr. Haroldine Laws  HPI:  50 y/o AAF w/ h/o HTN and chronic systolic heart failure. In May 2016, she underwent left VATS w/ drainage of empyema/ abscess and decortication by Dr. Roxan Hockey. Echocardiogram showed normal EF 50-55%.   She was admitted again 02/2015 w/ gastroenteritis/ sepsis and HTN urgency. Cardiology consulted for elevated troponin. Echo showed mildly reduced LVEF 40-45%, RV normal. LHC showed normal coronaries. Drop in EF felt to be stress induced. Unfortunately, she did not f/u w/ cardiology following that hospitalization.   Presented to ED on 11/20/20 w/ worsening, intermittent CP and exertional dyspnea. She had stopped taking all of her BP meds ~4 months ago due to finances. In ED, BP 220/137. BNP 1290, Hs trop 35>>34. She was admitted and restarted on PO antihypertensives and IV furosemide. Echo showed severely reduced EF, now 20-25%, GIIIDD, no LVH, Global HK, RV mildly reduced, RVSP 60 mmHg, mod MR, Mod TR. cMRI showed severely reduced LVEF, moderately reduced RV, small pericardial effusion. Findings consistent with HTN heart disease and pulmonary HTN. She was started on GDMT. D/c weight 153 lbs.   Recently returned to HF Clinic for post hospitalization HF follow up on 01/19/21. Overall was feeling tired. She had 4 pillow orthopnea/PND, +bendopnea. SOB with exertion. Had some atypical CP. Denied increasing SOB, dizziness, edema.  Appetite was ok. No fever or chills. Had not been weighing at home but did have scale. Reported taking all medications. Weight was up 12 lbs since discharge.  Today she returns to HF clinic for pharmacist medication titration. At last visit with [facility],  hydralazine was increased to 75 mg TID. Overall she is feeling ok today. Notes mild dizziness approximately 30 minutes after taking medications, which lasts 30-60 minutes. This has not increased since medication changes were made  last visit. No chest pain or palpitations. No increased SOB/DOE. She has used two doses of furosemide since prescribed last visit for ankle edema. Last dose Saturday. Still unable to lie flat. Wakes up SOB approximately two times per week, which is not new. Appetite is still improving but "not great". She has been following low salt diet. Has received both Entresto and Farxiga from [employer] and [employer], respectively. BP in clinic elevated at 160/70. SBP at home have been ~160.    HF Medications: Carvedilol 25 mg BID Entresto 97/103 mg BID Spironolactone 25 mg daily Farxiga 10 mg daily Hydralazine 75 mg TID Isosorbide mononitrate 30 mg daily Furosemide 40 mg PRN  Has the patient been experiencing any side effects to the medications prescribed?  Headaches with isosorbide mononitrate.   Does the patient have any problems obtaining medications due to transportation or finances?   No insurance. Fills medications at [facility]. Maryan Puls from [employer] and [place] from [employer].   Understanding of regimen: good Understanding of indications: good Potential of compliance: good Patient understands to avoid NSAIDs. Patient understands to avoid decongestants.    Pertinent Lab Values: 02/05/21: Serum creatinine 0.95, BUN 22, Potassium 4.9, Sodium 139  Vital Signs: Weight: 174.6 lbs (last clinic weight: 173 lbs) Blood pressure: 160/70  Heart rate: 58   Assessment/Plan: Chronic Systolic Heart Failure with chest pressure  -NICM. LHC in 2016 showed normal coronaries - Echo 11/2014:  LVEF 50-55%, RV normal - Echo 02/2015: LVEF 40-45% (felt stress induced) AK of apical myocardium, G2DD, RV normal - Echo 12/2018: LVEF 40-45%, RV mildly reduced - EF now 20-25%, RV mildly reduced, in the setting of poorly  controlled HTN/ hypertensive emergency  - L/RHC (12/2020) w/ mild CAD. Suspect CM 2/2 HTN, RHC after diuresis showed well compensated left sided filling pressures - cMRI (12/2020): LV EF 22% severely  reduced, RVEF 35% moderately down, small circumferential pericardial effusion, findings consistent with hypertensive heart disease and pulmonary HTN. - NYHA III-early IIIb, volume stable. Continue furosemide 40 mg PRN for fluid overload. - Continue Carvedilol 25 mg BID.  - Continue Entresto 97-103 mg BID.  - Continue spironolactone 25 mg daily. - Increase Hydralazine to 100 mg TID and continue isosorbide mononitrate 30 mg daily. - Continue Farxiga 10 mg daily (hgb A1c 5.7). - Return to HF Clinic in 2 weeks with APP   2. Hypertension - Poorly controlled in setting of poor med compliance. - Continue to titrate with goal SBP 130-150. See plan above.  -Consider addition of amlodipine at next visit - Will arrange for sleep study to r/o OSA.   3. 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   4. Pulmonary HTN - RVSP severely elevated on echo, 60 mmHg. - Suspect primarily 2/2 left sided heart disease/ acute CHF w/ elevated filling pressures. 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 - Schedule sleep study.   5. Tobacco abuse - She has quit.    6. CAD - LAD 45%. - Mild CP with deep inspiration.  - Continue statin.     Audry Riles, PharmD, BCPS, BCCP, CPP Heart Failure Clinic Pharmacist [phone]

## 2021-02-09 ENCOUNTER — Other Ambulatory Visit: Payer: Self-pay

## 2021-02-12 ENCOUNTER — Telehealth: Payer: Self-pay | Admitting: Clinical

## 2021-02-12 NOTE — Telephone Encounter (Signed)
[facility] General Follow Up Note  02/12/2021 Name: Emily Key MRN: [id number] DOB: 1971-03-04 Emily Key is a 50 y.o. year old female who sees Vevelyn Francois, NP for primary care. LCSW was initially consulted to assess mental health needs and assist the patient with Mental Health Counseling and Resources.  *This was an unsuccessful encounter.*  Interpreter: No.   Interpreter Name & Language: none  Assessment: Patient is currently experiencing symptoms of depression which are exacerbated by health conditions and change in ability to work.  Ongoing Intervention: Patient missed appointment with CSW on 02/08/21. CSW called patient on 8/2 and today, 02/12/21 to reschedule. No answer and no voicemail available. CSW available from clinic for follow up if needed.  Review of patient status, including review of consultants reports, relevant laboratory and other test results, and collaboration with appropriate care team members and the patient's provider was performed as part of comprehensive patient evaluation and provision of services.    Estanislado Emms, [facility] [phone]

## 2021-02-17 ENCOUNTER — Ambulatory Visit (HOSPITAL_COMMUNITY)
Admission: RE | Admit: 2021-02-17 | Discharge: 2021-02-17 | Disposition: A | Discharge status: Home / Self Care | Payer: Medicaid Other | Source: Ambulatory Visit | Attending: Internal Medicine | Admitting: Internal Medicine

## 2021-02-17 ENCOUNTER — Other Ambulatory Visit: Payer: Self-pay

## 2021-02-17 VITALS — BP 160/70 | HR 58 | Wt 174.6 lb

## 2021-02-17 DIAGNOSIS — Z87891 Personal history of nicotine dependence: Secondary | ICD-10-CM | POA: Insufficient documentation

## 2021-02-17 DIAGNOSIS — I5022 Chronic systolic (congestive) heart failure: Secondary | ICD-10-CM | POA: Insufficient documentation

## 2021-02-17 DIAGNOSIS — I2510 Atherosclerotic heart disease of native coronary artery without angina pectoris: Secondary | ICD-10-CM | POA: Insufficient documentation

## 2021-02-17 DIAGNOSIS — I081 Rheumatic disorders of both mitral and tricuspid valves: Secondary | ICD-10-CM | POA: Insufficient documentation

## 2021-02-17 DIAGNOSIS — I428 Other cardiomyopathies: Secondary | ICD-10-CM | POA: Insufficient documentation

## 2021-02-17 DIAGNOSIS — I110 Hypertensive heart disease with heart failure: Secondary | ICD-10-CM | POA: Insufficient documentation

## 2021-02-17 DIAGNOSIS — Z9114 Patient's other noncompliance with medication regimen: Secondary | ICD-10-CM | POA: Insufficient documentation

## 2021-02-17 DIAGNOSIS — I272 Pulmonary hypertension, unspecified: Secondary | ICD-10-CM | POA: Insufficient documentation

## 2021-02-17 MED ORDER — HYDRALAZINE HCL 100 MG PO TABS
100.0000 mg | ORAL_TABLET | Freq: Three times a day (TID) | ORAL | 11 refills | Status: DC
  Filled 2021-02-17 – 2021-08-27 (×3): qty 90, 30d supply, fill #0

## 2021-02-17 NOTE — Patient Instructions (Addendum)
It was a pleasure seeing you today!  MEDICATIONS: -We are changing your medications today -Increase hydralazine to 100 mg (1 tablet) three times daily. You may take 2 tablets of the 50 mg strength three times a day until you pick up the new strength.  -Call if you have questions about your medications.  NEXT APPOINTMENT: Return to clinic in 2 weeks with [facility].  In general, to take care of your heart failure: -Limit your fluid intake to 2 Liters (half-gallon) per day.   -Limit your salt intake to ideally 2-3 grams (2000-3000 mg) per day. -Weigh yourself daily and record, and bring that "weight diary" to your next appointment.  (Weight gain of 2-3 pounds in 1 day typically means fluid weight.) -The medications for your heart are to help your heart and help you live longer.   -Please contact us before stopping any of your heart medications.  Call the clinic at [phone] with questions or to reschedule future appointments.

## 2021-02-24 ENCOUNTER — Other Ambulatory Visit: Payer: Self-pay

## 2021-03-01 NOTE — Progress Notes (Signed)
Advanced Heart Failure Clinic Note   PCP: Vevelyn Francois, NP PCP-Cardiologist: None  HF Cardiologist: Dr. Haroldine Laws  HPI: 50 y.o.AAF w/ h/o HTN and chronic systolic heart failure. In May 2016, she underwent left VATS w/ drainage of empyema/ abscess and decortication by Dr. Roxan Hockey. Echocardiogram showed normal EF 50-55%.   She was admitted again 02/2015 w/ gastroenteritis/ sepsis and HTN urgency. Cardiology consulted for elevated troponin. Echo showed mildly reduced LVEF 40-45%, RV normal. LHC showed normal coronaries. Drop in EF felt to be stress induced. Unfortunately, she did not f/u w/ cardiology following that hospitalization.   Presented to ED on 11/20/20 w/ CP and exertional dyspnea. She had stopped taking all of her BP meds ~4 months ago due to finances. In ED, BP 220/137. She was admitted and restarted on PO antihypertensives and IV Lasix. Echo showed severely reduced EF, down to 20-25%, GIIIDD, no LVH, Global HK, RV mildly reduced, RVSP 60 mmHg, mod MR, Mod TR. cMRI showed severely reduced LVEF, moderately reduced RV, small pericardia effusion. Findings consistent with HTN heart disease and pulmonary HTN. She was started on GDMT. D/c weight 153 lbs.  Today she returns for HF follow up. Bp at home still 150s/90s, trouble taking hydralazine tid. Dyspnea with stairs or incline. Some atypical chest heaviness 2-3x/week, resolves spontaneously. +3 pillow orthopnea. Denies increasing dizziness, edema. Appetite ok. Weight at home 170-175 pounds. Taking all medications. Took lasix twice in last couple of months.  Cardiac Studies   - Echo (5/16):  LVEF 50-55%, RV normal - Echo (8/16): LVEF 40-45%, AK of apical myocardium, G2DD, RV normal - Echo (6/20): LVEF 40-45%, RV mildly reduced - Echo (6/22): LVEF 20-25%, GIIIDD, no LVH, Global HK, RV mildly reduced, RVSP 60 mmHg, mod MR, Mod TR   - LHC (2016): normal coronaries   - R/LHC (6/16): Prox LAD lesion is 45% stenosed.   Findings:    Ao = 177/94 (126) LV = 176/18 RA = 5 RV = 64/9 PA = 64/17 (35) PCW = 18 Fick cardiac output/index = 5.0/2.8 PVR = 3.4 WU Ao sat = 97% PA sat = 68%, 69%   Assessment: 1. Mild CAD 2. Severe NICM likely due to HTN CM 3. 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 Left-sided pressures relatively well compemsated.   - Renal artery u/s (6/22): no RAS or FMD   - cMRI (6/22): Severely reduced LV systolic function with moderate LV dilation, LVEF 22%. Global hypokinesis. Moderately reduced RV systolic function with mild RV enlargement. RVEF 35%. Delayed myocardial enhancement in the superior and inferior RV insertion points, with extension to the midmyocardium in the antero- and inferoseptum. May represent increased pulmartery pressure. Small circumferential pericardial effusion. Findings most consistent with hypertensive heart disease and pulmonary hypertension.  ROS: All systems reviewed and negative except as per HPI.   Past Medical History:  Diagnosis Date   Cerumen impaction 01/2019   CHF (congestive heart failure) (HCC)    after last pregnancy   Daily headache    Heart murmur    "born w/one":   History of hiatal hernia    Hyperlipidemia 01/2019   Hypertension    Hypertensive emergency 01/02/2019   Observed seizure-like activity ([place]) 01/02/2019   Pneumonia 11/2014   Seasonal allergies 01/2019   Vitamin D deficiency 01/2019   Current Outpatient Medications  Medication Sig Dispense Refill   carvedilol (COREG) 25 MG tablet Take 1 tablet (25 mg total) by mouth 2 (two) times daily with a meal. 60 tablet 5  citalopram (CELEXA) 20 MG tablet Take 1 tablet (20 mg total) by mouth daily. 30 tablet 3   dapagliflozin propanediol (FARXIGA) 10 MG TABS tablet Take 1 tablet (10 mg total) by mouth daily. 30 tablet 5   furosemide (LASIX) 20 MG tablet Take 2 tablets (40 mg total) by mouth as needed for fluid or edema. 30 tablet 11   hydrALAZINE (APRESOLINE) 100 MG tablet Take 1 tablet (100 mg  total) by mouth 3 (three) times daily. 90 tablet 11   isosorbide mononitrate (IMDUR) 30 MG 24 hr tablet Take 1 tablet (30 mg total) by mouth daily. 30 tablet 5   sacubitril-valsartan (ENTRESTO) 97-103 MG Take 1 tablet by mouth 2 (two) times daily. 60 tablet 5   spironolactone (ALDACTONE) 25 MG tablet Take 1 tablet (25 mg total) by mouth daily. 30 tablet 5   No current facility-administered medications for this encounter.   No Known Allergies  Social History   Socioeconomic History   Marital status: Divorced    Spouse name: Not on file   Number of children: Not on file   Years of education: Not on file   Highest education level: Not on file  Occupational History   Not on file  Tobacco Use   Smoking status: Former    Packs/day: 0.33    Years: 10.00    Pack years: 3.30    Types: Cigarettes    Quit date: 11/11/2014    Years since quitting: 6.3   Smokeless tobacco: Never  Substance and Sexual Activity   Alcohol use: Yes    Alcohol/week: 0.0 standard drinks    Comment: 8/1/20176 "a couple beers a couple times/month"   Drug use: No   Sexual activity: Yes  Other Topics Concern   Not on file  Social History Narrative   Not on file   Social Determinants of Health   Financial Resource Strain: High Risk   Difficulty of Paying Living Expenses: Hard  Food Insecurity: Food Insecurity Present   Worried About Charity fundraiser in the Last Year: Sometimes true   Ran Out of Food in the Last Year: Sometimes true  Transportation Needs: Unmet Transportation Needs   Lack of Transportation (Medical): Yes   Lack of Transportation (Non-Medical): Yes  Physical Activity: Not on file  Stress: Not on file  Social Connections: Not on file  Intimate Partner Violence: Not on file   Family History  Problem Relation Age of Onset   Diabetes Mellitus II Mother    Hypertension Mother    Lung cancer Father    BP (!) 152/78   Pulse 67   Wt 80.2 kg (176 lb 12.8 oz)   SpO2 98%   BMI 26.88 kg/m    Wt Readings from Last 3 Encounters:  03/02/21 80.2 kg (176 lb 12.8 oz)  02/17/21 79.2 kg (174 lb 9.6 oz)  02/05/21 78.5 kg (173 lb 0.6 oz)   PHYSICAL EXAM: General:  NAD. No resp difficulty HEENT: Normal Neck: Supple. No JVD. Carotids 2+ bilat; no bruits. No lymphadenopathy or thryomegaly appreciated. Cor: PMI nondisplaced. Regular rate & rhythm. No rubs, gallops or murmurs. Lungs: Clear Abdomen: Soft, nontender, nondistended. No hepatosplenomegaly. No bruits or masses. Good bowel sounds. Extremities: No cyanosis, clubbing, rash, edema Neuro: Alert & oriented x 3, cranial nerves grossly intact. Moves all 4 extremities w/o difficulty. Affect pleasant.  ASSESSMENT & PLAN: Chronic Systolic Heart Failure with chest pressure - NICM. LHC in 2016 showed normal coronaries - Echo 11/2014:  LVEF 50-55%,  RV normal - Echo 02/2015: LVEF 40-45% (felt stress induced) AK of apical myocardium, G2DD, RV normal - Echo 12/2018: LVEF 40-45%, RV mildly reduced - Echo (6/22): EF 20-25%, RV mildly reduced, in the setting of poorly controlled HTN/ hypertensive emergency  - L/RHC (6/22) w/ mild CAD. Suspect CM 2/2 HTN, RHC after diureses showed well compensated left sided filling pressures - cMRI (6/22): LV EF 22% severely reduced, RVEF 35% moderately down, small circumferential pericardial effusion, findings consistent with hypertensive heart disease and pulmonary HTN. - Improved NYHA II. Volume is good today. - Continue lasix 40 mg prn for fluid overload.  - Continue Entresto 97-103 mg bid.  - Continue spironolactone 25 mg daily. - Continue Coreg 25 mg bid.  - Continue hydralazine 100 mg tid + Imdur 30 mg daily (HA better with Tylenol). - Continue Farxiga 10 mg daily (hgb A1c 5.7). - BMET today.   2. Hypertension - Remains elevated. - Start amlodipine 5 mg daily. - Continue to check BP at home and log. - Will not increase Imdur as she has had HA with this.   3. 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   4. Pulmonary HTN - RVSP severely elevated on echo, 60 mHg. - Suspect primarily 2/2 left sided heart disease/ acute CHF w/ elevated filling pressures. - RHC w/ mild to moderate mixed pulmonary HTN. - Sleep study scheduled 03/29/21.   5. Tobacco abuse - She remains quit.    6. CAD - LAD 45%. - Mild CP with deep inspiration.  - Continue statin.   She has been compliant with her GDMT. Will have her follow up with Dr. Haroldine Laws + echo in 8-10 weeks.  [place], FNP 03/02/21

## 2021-03-02 ENCOUNTER — Other Ambulatory Visit: Payer: Self-pay

## 2021-03-02 ENCOUNTER — Encounter (HOSPITAL_COMMUNITY): Payer: Self-pay

## 2021-03-02 ENCOUNTER — Ambulatory Visit (HOSPITAL_COMMUNITY)
Admission: RE | Admit: 2021-03-02 | Discharge: 2021-03-02 | Disposition: A | Discharge status: Home / Self Care | Payer: Self-pay | Source: Ambulatory Visit | Attending: Family Medicine | Admitting: Family Medicine

## 2021-03-02 VITALS — BP 152/78 | HR 67 | Wt 176.8 lb

## 2021-03-02 DIAGNOSIS — Z596 Low income: Secondary | ICD-10-CM | POA: Insufficient documentation

## 2021-03-02 DIAGNOSIS — Z79899 Other long term (current) drug therapy: Secondary | ICD-10-CM | POA: Insufficient documentation

## 2021-03-02 DIAGNOSIS — Z87891 Personal history of nicotine dependence: Secondary | ICD-10-CM | POA: Insufficient documentation

## 2021-03-02 DIAGNOSIS — Z7984 Long term (current) use of oral hypoglycemic drugs: Secondary | ICD-10-CM | POA: Insufficient documentation

## 2021-03-02 DIAGNOSIS — I5022 Chronic systolic (congestive) heart failure: Secondary | ICD-10-CM | POA: Insufficient documentation

## 2021-03-02 DIAGNOSIS — I2510 Atherosclerotic heart disease of native coronary artery without angina pectoris: Secondary | ICD-10-CM | POA: Insufficient documentation

## 2021-03-02 DIAGNOSIS — I340 Nonrheumatic mitral (valve) insufficiency: Secondary | ICD-10-CM

## 2021-03-02 DIAGNOSIS — I272 Pulmonary hypertension, unspecified: Secondary | ICD-10-CM | POA: Insufficient documentation

## 2021-03-02 DIAGNOSIS — I428 Other cardiomyopathies: Secondary | ICD-10-CM | POA: Insufficient documentation

## 2021-03-02 DIAGNOSIS — I110 Hypertensive heart disease with heart failure: Secondary | ICD-10-CM | POA: Insufficient documentation

## 2021-03-02 DIAGNOSIS — I10 Essential (primary) hypertension: Secondary | ICD-10-CM

## 2021-03-02 DIAGNOSIS — Z5941 Food insecurity: Secondary | ICD-10-CM | POA: Insufficient documentation

## 2021-03-02 DIAGNOSIS — I071 Rheumatic tricuspid insufficiency: Secondary | ICD-10-CM

## 2021-03-02 DIAGNOSIS — R0683 Snoring: Secondary | ICD-10-CM

## 2021-03-02 DIAGNOSIS — R0789 Other chest pain: Secondary | ICD-10-CM | POA: Insufficient documentation

## 2021-03-02 DIAGNOSIS — Z8249 Family history of ischemic heart disease and other diseases of the circulatory system: Secondary | ICD-10-CM | POA: Insufficient documentation

## 2021-03-02 DIAGNOSIS — I313 Pericardial effusion (noninflammatory): Secondary | ICD-10-CM | POA: Insufficient documentation

## 2021-03-02 LAB — BASIC METABOLIC PANEL
Anion gap: 7 (ref 5–15)
BUN: 16 mg/dL (ref 6–20)
CO2: 27 mmol/L (ref 22–32)
Calcium: 9.7 mg/dL (ref 8.9–10.3)
Chloride: 100 mmol/L (ref 98–111)
Creatinine, Ser: 0.94 mg/dL (ref 0.44–1.00)
GFR, Estimated: >60 mL/min (ref >60)
Glucose, Bld: 91 mg/dL (ref 70–99)
Potassium: 4.6 mmol/L (ref 3.5–5.1)
Sodium: 134 mmol/L — ABNORMAL LOW (ref 135–145)

## 2021-03-02 MED ORDER — AMLODIPINE BESYLATE 5 MG PO TABS
5.0000 mg | ORAL_TABLET | Freq: Every day | ORAL | 11 refills | Status: DC
  Filled 2021-03-02: qty 30, 30d supply, fill #0
  Filled 2021-08-27: qty 30, 30d supply, fill #1
  Filled 2021-08-27: qty 30, 30d supply, fill #0

## 2021-03-02 NOTE — Patient Instructions (Signed)
START Amlodipine 5 mg, one tab daily  Labs today We will only contact you if something comes back abnormal or we need to make some changes. Otherwise no news is good news!  Your physician recommends that you schedule a follow-up appointment in: 6 weeks with Dr Haroldine Laws and echo  Your physician has requested that you have an echocardiogram. Echocardiography is a painless test that uses sound waves to create images of your heart. It provides your doctor with information about the size and shape of your heart and how well your heart's chambers and valves are working. This procedure takes approximately one hour. There are no restrictions for this procedure.  Do the following things EVERYDAY: Weigh yourself in the morning before breakfast. Write it down and keep it in a log. Take your medicines as prescribed Eat low salt foods--Limit salt (sodium) to 2000 mg per day.  Stay as active as you can everyday Limit all fluids for the day to less than 2 liters  At the [facility], you and your health needs are our priority. As part of our continuing mission to provide you with exceptional heart care, we have created designated Provider Care Teams. These Care Teams include your primary Cardiologist (physician) and Advanced Practice Providers (APPs- Physician Assistants and Nurse Practitioners) who all work together to provide you with the care you need, when you need it.   You may see any of the following providers on your designated Care Team at your next follow up: Dr Glori Bickers Dr Loralie Champagne Dr Patrice Paradise, NP Lyda Jester, [place] Ginnie Smart Audry Riles, PharmD   Please be sure to bring in all your medications bottles to every appointment.

## 2021-03-04 ENCOUNTER — Other Ambulatory Visit: Payer: Self-pay

## 2021-03-08 ENCOUNTER — Other Ambulatory Visit (HOSPITAL_COMMUNITY): Payer: Self-pay

## 2021-03-29 ENCOUNTER — Other Ambulatory Visit: Payer: Self-pay

## 2021-03-29 ENCOUNTER — Ambulatory Visit (HOSPITAL_BASED_OUTPATIENT_CLINIC_OR_DEPARTMENT_OTHER): Payer: Self-pay | Attending: Family Medicine | Admitting: Cardiology

## 2021-03-29 DIAGNOSIS — R0683 Snoring: Secondary | ICD-10-CM | POA: Insufficient documentation

## 2021-04-05 NOTE — Procedures (Signed)
   Patient Name: Emily, Key Date: 03/29/2021 Gender: Female D.O.B: 02-21-1971 Age (years): 70 Referring Provider: Allena Katz FNP Height (inches): 54 Interpreting Physician: Fransico Him MD, ABSM Weight (lbs): 157 RPSGT: Gwenyth Allegra BMI: 25 MRN: [id number] Neck Size: 14.00  CLINICAL INFORMATION Sleep Study Type: NPSG  Indication for sleep study: Hypertension, Snoring  Epworth Sleepiness Score: 4  SLEEP STUDY TECHNIQUE As per the AASM Manual for the Scoring of Sleep and Associated Events v2.3 (April 2016) with a hypopnea requiring 4% desaturations.  The channels recorded and monitored were frontal, central and occipital EEG, electrooculogram (EOG), submentalis EMG (chin), nasal and oral airflow, thoracic and abdominal wall motion, anterior tibialis EMG, snore microphone, electrocardiogram, and pulse oximetry.  MEDICATIONS Medications self-administered by patient taken the night of the study : N/A  SLEEP ARCHITECTURE The study was initiated at 10:02:31 PM and ended at 4:56:34 AM.  Sleep onset time was 30.4 minutes and the sleep efficiency was 77.0%. The total sleep time was 319 minutes.  Stage REM latency was 117.5 minutes.  The patient spent 11.4% of the night in stage N1 sleep, 56.7% in stage N2 sleep, 0.0% in stage N3 and 31.8% in REM.  Alpha intrusion was absent.  Supine sleep was 43.17%.  RESPIRATORY PARAMETERS The overall apnea/hypopnea index (AHI) was 4.7 per hour. There were 3 total apneas, including 2 obstructive, 1 central and 0 mixed apneas. There were 22 hypopneas and 24 RERAs.  The AHI during Stage REM sleep was 1.2 per hour.  AHI while supine was 7.4 per hour.  The mean oxygen saturation was 96.3%. The minimum SpO2 during sleep was 89.0%.  loud snoring was noted during this study.  CARDIAC DATA The 2 lead EKG demonstrated sinus rhythm. The mean heart rate was 57.2 beats per minute. Other EKG findings include: PVCs  LEG MOVEMENT  DATA The total PLMS were 0 with a resulting PLMS index of 0.0. Associated arousal with leg movement index was 0.0 .  IMPRESSIONS - No significant obstructive sleep apnea occurred during this study (AHI = 4.7/h). - No significant central sleep apnea occurred during this study (CAI = 0.2/h). - The patient had minimal or no oxygen desaturation during the study (Min O2 = 89.0%) - The patient snored with loud snoring volume. - PVCs were noted during this study. - Clinically significant periodic limb movements did not occur during sleep. No significant associated arousals.  DIAGNOSIS - Normal Study  RECOMMENDATIONS - Avoid alcohol, sedatives and other CNS depressants that may worsen sleep apnea and disrupt normal sleep architecture. - Sleep hygiene should be reviewed to assess factors that may improve sleep quality. - Weight management and regular exercise should be initiated or continued if appropriate.  [Electronically signed] 04/05/2021 07:15 PM  Fransico Him MD, ABSM Diplomate, American Board of Sleep Medicine

## 2021-04-06 ENCOUNTER — Telehealth: Payer: Self-pay | Admitting: *Deleted

## 2021-04-06 NOTE — Telephone Encounter (Signed)
-----   Message from Sueanne Margarita, MD sent at 04/05/2021  7:16 PM EDT ----- Please let patient know that sleep study showed no significant sleep apnea.

## 2021-04-06 NOTE — Telephone Encounter (Signed)
The patient has been notified of the result and verbalized understanding.  All questions (if any) were answered. Marolyn Hammock, [place] 04/06/2021 4:02 PM. Pt is aware and agreeable to normal results.

## 2021-04-09 ENCOUNTER — Encounter: Payer: Self-pay | Admitting: *Deleted

## 2021-04-09 NOTE — Telephone Encounter (Signed)
-----   Message from Sueanne Margarita, MD sent at 04/05/2021  7:16 PM EDT ----- Please let patient know that sleep study showed no significant sleep apnea.

## 2021-04-09 NOTE — Telephone Encounter (Signed)
The patient has been notified of the result and verbalized understanding.   Emily Key, [name] 04/09/2021 3:09 PM    Pt is aware and agreeable to normal results

## 2021-04-20 NOTE — Telephone Encounter (Signed)
This encounter was created in error - please disregard.

## 2021-04-29 ENCOUNTER — Ambulatory Visit (HOSPITAL_COMMUNITY): Payer: Medicaid Other

## 2021-04-29 ENCOUNTER — Encounter (HOSPITAL_COMMUNITY): Payer: Medicaid Other | Admitting: Internal Medicine

## 2021-05-10 ENCOUNTER — Ambulatory Visit: Payer: Medicaid Other | Admitting: Nurse Practitioner

## 2021-05-13 ENCOUNTER — Ambulatory Visit: Payer: Medicaid Other | Admitting: Nurse Practitioner

## 2021-06-08 ENCOUNTER — Ambulatory Visit (HOSPITAL_COMMUNITY): Admission: RE | Admit: 2021-06-08 | Payer: Medicaid Other | Source: Ambulatory Visit

## 2021-07-16 ENCOUNTER — Encounter (HOSPITAL_COMMUNITY): Payer: Medicaid Other | Admitting: Internal Medicine

## 2021-08-27 ENCOUNTER — Other Ambulatory Visit: Payer: Self-pay

## 2021-08-30 ENCOUNTER — Other Ambulatory Visit: Payer: Self-pay

## 2021-10-05 ENCOUNTER — Other Ambulatory Visit (HOSPITAL_COMMUNITY): Payer: Self-pay

## 2021-10-05 ENCOUNTER — Telehealth (HOSPITAL_COMMUNITY): Payer: Self-pay | Admitting: Pharmacy Technician

## 2021-10-05 NOTE — Telephone Encounter (Signed)
Advanced Heart Failure Patient Advocate Encounter ? ?Received a notice from [employer] that it is time to renew [place] assistance. Patient is currently uninsured. Can use the HF fund in June when assistance ends if that is still the case. Looks like Child psychotherapist through [employer] also end in June.  ? ?Left voicemail for patient to call back and start the re-enrollment process. ? ?Charlann Boxer, CPhT ? ? ?

## 2021-11-15 ENCOUNTER — Other Ambulatory Visit (HOSPITAL_COMMUNITY): Payer: Self-pay

## 2021-11-19 ENCOUNTER — Emergency Department (HOSPITAL_COMMUNITY): Payer: 59

## 2021-11-19 ENCOUNTER — Other Ambulatory Visit: Payer: Self-pay

## 2021-11-19 ENCOUNTER — Inpatient Hospital Stay (HOSPITAL_COMMUNITY)
Admission: EM | Admit: 2021-11-19 | Discharge: 2021-11-25 | DRG: 291 | Disposition: A | Discharge status: Home Health | Payer: 59 | Attending: Internal Medicine | Admitting: Internal Medicine

## 2021-11-19 ENCOUNTER — Encounter (HOSPITAL_COMMUNITY): Payer: Self-pay

## 2021-11-19 DIAGNOSIS — R1084 Generalized abdominal pain: Secondary | ICD-10-CM | POA: Diagnosis not present

## 2021-11-19 DIAGNOSIS — I5023 Acute on chronic systolic (congestive) heart failure: Secondary | ICD-10-CM | POA: Diagnosis not present

## 2021-11-19 DIAGNOSIS — Z8249 Family history of ischemic heart disease and other diseases of the circulatory system: Secondary | ICD-10-CM

## 2021-11-19 DIAGNOSIS — I509 Heart failure, unspecified: Secondary | ICD-10-CM

## 2021-11-19 DIAGNOSIS — F32A Depression, unspecified: Secondary | ICD-10-CM | POA: Diagnosis present

## 2021-11-19 DIAGNOSIS — I5042 Chronic combined systolic (congestive) and diastolic (congestive) heart failure: Secondary | ICD-10-CM

## 2021-11-19 DIAGNOSIS — Z91148 Patient's other noncompliance with medication regimen for other reason: Secondary | ICD-10-CM

## 2021-11-19 DIAGNOSIS — F419 Anxiety disorder, unspecified: Secondary | ICD-10-CM | POA: Diagnosis present

## 2021-11-19 DIAGNOSIS — I110 Hypertensive heart disease with heart failure: Secondary | ICD-10-CM | POA: Diagnosis not present

## 2021-11-19 DIAGNOSIS — N179 Acute kidney failure, unspecified: Secondary | ICD-10-CM

## 2021-11-19 DIAGNOSIS — R1013 Epigastric pain: Secondary | ICD-10-CM

## 2021-11-19 DIAGNOSIS — I5022 Chronic systolic (congestive) heart failure: Secondary | ICD-10-CM

## 2021-11-19 DIAGNOSIS — I161 Hypertensive emergency: Secondary | ICD-10-CM | POA: Diagnosis present

## 2021-11-19 DIAGNOSIS — I248 Other forms of acute ischemic heart disease: Secondary | ICD-10-CM | POA: Diagnosis present

## 2021-11-19 DIAGNOSIS — E559 Vitamin D deficiency, unspecified: Secondary | ICD-10-CM | POA: Diagnosis present

## 2021-11-19 DIAGNOSIS — I160 Hypertensive urgency: Secondary | ICD-10-CM | POA: Diagnosis not present

## 2021-11-19 DIAGNOSIS — R0789 Other chest pain: Secondary | ICD-10-CM | POA: Diagnosis not present

## 2021-11-19 DIAGNOSIS — I5043 Acute on chronic combined systolic (congestive) and diastolic (congestive) heart failure: Secondary | ICD-10-CM | POA: Diagnosis present

## 2021-11-19 DIAGNOSIS — I428 Other cardiomyopathies: Secondary | ICD-10-CM | POA: Diagnosis present

## 2021-11-19 DIAGNOSIS — Z87891 Personal history of nicotine dependence: Secondary | ICD-10-CM

## 2021-11-19 DIAGNOSIS — Z79899 Other long term (current) drug therapy: Secondary | ICD-10-CM

## 2021-11-19 DIAGNOSIS — I2510 Atherosclerotic heart disease of native coronary artery without angina pectoris: Secondary | ICD-10-CM | POA: Diagnosis present

## 2021-11-19 DIAGNOSIS — I472 Ventricular tachycardia, unspecified: Secondary | ICD-10-CM | POA: Diagnosis not present

## 2021-11-19 DIAGNOSIS — I10 Essential (primary) hypertension: Secondary | ICD-10-CM | POA: Diagnosis present

## 2021-11-19 DIAGNOSIS — I3139 Other pericardial effusion (noninflammatory): Secondary | ICD-10-CM | POA: Diagnosis present

## 2021-11-19 DIAGNOSIS — Z801 Family history of malignant neoplasm of trachea, bronchus and lung: Secondary | ICD-10-CM

## 2021-11-19 DIAGNOSIS — Z833 Family history of diabetes mellitus: Secondary | ICD-10-CM

## 2021-11-19 DIAGNOSIS — I252 Old myocardial infarction: Secondary | ICD-10-CM

## 2021-11-19 DIAGNOSIS — I272 Pulmonary hypertension, unspecified: Secondary | ICD-10-CM | POA: Diagnosis present

## 2021-11-19 DIAGNOSIS — E785 Hyperlipidemia, unspecified: Secondary | ICD-10-CM | POA: Diagnosis present

## 2021-11-19 LAB — CBC
HCT: 40.6 % (ref 36.0–46.0)
Hemoglobin: 13.2 g/dL (ref 12.0–15.0)
MCH: 26.5 pg (ref 26.0–34.0)
MCHC: 32.5 g/dL (ref 30.0–36.0)
MCV: 81.4 fL (ref 80.0–100.0)
Platelets: 282 10*3/uL (ref 150–400)
RBC: 4.99 MIL/uL (ref 3.87–5.11)
RDW: 14.6 % (ref 11.5–15.5)
WBC: 7.2 10*3/uL (ref 4.0–10.5)
nRBC: 0 % (ref 0.0–0.2)

## 2021-11-19 LAB — URINALYSIS, ROUTINE W REFLEX MICROSCOPIC
Bilirubin Urine: NEGATIVE
Glucose, UA: >=500 mg/dL — AB
Ketones, ur: NEGATIVE mg/dL
Leukocytes,Ua: NEGATIVE
Nitrite: NEGATIVE
Protein, ur: 100 mg/dL — AB
Specific Gravity, Urine: 1.011 (ref 1.005–1.030)
pH: 5 (ref 5.0–8.0)

## 2021-11-19 LAB — LIPASE, BLOOD: Lipase: 20 U/L (ref 11–51)

## 2021-11-19 LAB — BASIC METABOLIC PANEL
Anion gap: 11 (ref 5–15)
BUN: 17 mg/dL (ref 6–20)
CO2: 23 mmol/L (ref 22–32)
Calcium: 9.3 mg/dL (ref 8.9–10.3)
Chloride: 107 mmol/L (ref 98–111)
Creatinine, Ser: 1.03 mg/dL — ABNORMAL HIGH (ref 0.44–1.00)
GFR, Estimated: >60 mL/min (ref >60)
Glucose, Bld: 140 mg/dL — ABNORMAL HIGH (ref 70–99)
Potassium: 3.6 mmol/L (ref 3.5–5.1)
Sodium: 141 mmol/L (ref 135–145)

## 2021-11-19 LAB — HEPATIC FUNCTION PANEL
ALT: 21 U/L (ref 0–44)
AST: 22 U/L (ref 15–41)
Albumin: 3.6 g/dL (ref 3.5–5.0)
Alkaline Phosphatase: 62 U/L (ref 38–126)
Bilirubin, Direct: 0.3 mg/dL — ABNORMAL HIGH (ref 0.0–0.2)
Indirect Bilirubin: 0.6 mg/dL (ref 0.3–0.9)
Total Bilirubin: 0.9 mg/dL (ref 0.3–1.2)
Total Protein: 7.1 g/dL (ref 6.5–8.1)

## 2021-11-19 LAB — TSH: TSH: 0.54 u[IU]/mL (ref 0.350–4.500)

## 2021-11-19 LAB — I-STAT BETA HCG BLOOD, ED (MC, WL, AP ONLY): I-stat hCG, quantitative: <5 m[IU]/mL (ref <5)

## 2021-11-19 LAB — BRAIN NATRIURETIC PEPTIDE: B Natriuretic Peptide: 1619.6 pg/mL — ABNORMAL HIGH (ref 0.0–100.0)

## 2021-11-19 LAB — TROPONIN I (HIGH SENSITIVITY)
Troponin I (High Sensitivity): 32 ng/L — ABNORMAL HIGH (ref <18)
Troponin I (High Sensitivity): 21 ng/L — ABNORMAL HIGH (ref <18)

## 2021-11-19 MED ORDER — ALUM & MAG HYDROXIDE-SIMETH 200-200-20 MG/5ML PO SUSP
30.0000 mL | Freq: Four times a day (QID) | ORAL | Status: DC | PRN
  Administered 2021-11-19: 30 mL via ORAL
  Filled 2021-11-19 (×2): qty 30

## 2021-11-19 MED ORDER — DAPAGLIFLOZIN PROPANEDIOL 10 MG PO TABS
10.0000 mg | ORAL_TABLET | Freq: Every day | ORAL | Status: DC
Start: 2021-11-20 — End: 2021-11-25
  Administered 2021-11-20 – 2021-11-25 (×6): 10 mg via ORAL
  Filled 2021-11-19 (×6): qty 1

## 2021-11-19 MED ORDER — ONDANSETRON HCL 4 MG PO TABS: 4.0000 mg | ORAL_TABLET | Freq: Four times a day (QID) | ORAL | Status: DC | PRN

## 2021-11-19 MED ORDER — ISOSORBIDE MONONITRATE ER 60 MG PO TB24
60.0000 mg | ORAL_TABLET | Freq: Every day | ORAL | Status: DC
  Administered 2021-11-19 – 2021-11-20 (×2): 60 mg via ORAL
  Filled 2021-11-19: qty 1
  Filled 2021-11-19: qty 2

## 2021-11-19 MED ORDER — HYDRALAZINE HCL 50 MG PO TABS
100.0000 mg | ORAL_TABLET | Freq: Three times a day (TID) | ORAL | Status: DC
  Administered 2021-11-19: 100 mg via ORAL
  Filled 2021-11-19 (×4): qty 2

## 2021-11-19 MED ORDER — CITALOPRAM HYDROBROMIDE 20 MG PO TABS
20.0000 mg | ORAL_TABLET | Freq: Every day | ORAL | Status: DC
  Administered 2021-11-20 – 2021-11-23 (×4): 20 mg via ORAL
  Filled 2021-11-19 (×4): qty 1

## 2021-11-19 MED ORDER — FUROSEMIDE 10 MG/ML IJ SOLN
40.0000 mg | Freq: Once | INTRAMUSCULAR | Status: AC
  Administered 2021-11-19: 40 mg via INTRAVENOUS
  Filled 2021-11-19: qty 4

## 2021-11-19 MED ORDER — CARVEDILOL 25 MG PO TABS
25.0000 mg | ORAL_TABLET | Freq: Two times a day (BID) | ORAL | Status: DC
Start: 2021-11-19 — End: 2021-11-23
  Administered 2021-11-19 – 2021-11-22 (×6): 25 mg via ORAL
  Filled 2021-11-19 (×6): qty 1
  Filled 2021-11-19: qty 2

## 2021-11-19 MED ORDER — SPIRONOLACTONE 25 MG PO TABS
25.0000 mg | ORAL_TABLET | Freq: Every day | ORAL | Status: DC
  Administered 2021-11-19 – 2021-11-20 (×2): 25 mg via ORAL
  Filled 2021-11-19 (×3): qty 1

## 2021-11-19 MED ORDER — ACETAMINOPHEN 325 MG PO TABS
650.0000 mg | ORAL_TABLET | Freq: Four times a day (QID) | ORAL | Status: DC | PRN
  Administered 2021-11-19 – 2021-11-24 (×6): 650 mg via ORAL
  Filled 2021-11-19 (×6): qty 2

## 2021-11-19 MED ORDER — ALBUTEROL SULFATE (2.5 MG/3ML) 0.083% IN NEBU: 2.5000 mg | INHALATION_SOLUTION | RESPIRATORY_TRACT | Status: DC | PRN

## 2021-11-19 MED ORDER — AMLODIPINE BESYLATE 5 MG PO TABS: 5.0000 mg | ORAL_TABLET | Freq: Every day | ORAL | Status: DC

## 2021-11-19 MED ORDER — ONDANSETRON HCL 4 MG/2ML IJ SOLN
4.0000 mg | Freq: Four times a day (QID) | INTRAMUSCULAR | Status: DC | PRN
  Administered 2021-11-19 – 2021-11-20 (×2): 4 mg via INTRAVENOUS
  Filled 2021-11-19 (×2): qty 2

## 2021-11-19 MED ORDER — ACETAMINOPHEN 650 MG RE SUPP: 650.0000 mg | Freq: Four times a day (QID) | RECTAL | Status: DC | PRN

## 2021-11-19 MED ORDER — AMLODIPINE BESYLATE 10 MG PO TABS
10.0000 mg | ORAL_TABLET | Freq: Every day | ORAL | Status: DC
  Administered 2021-11-20: 10 mg via ORAL
  Filled 2021-11-19 (×2): qty 1

## 2021-11-19 MED ORDER — HYDRALAZINE HCL 20 MG/ML IJ SOLN
10.0000 mg | INTRAMUSCULAR | Status: DC | PRN
  Administered 2021-11-24: 10 mg via INTRAVENOUS
  Filled 2021-11-19: qty 1

## 2021-11-19 MED ORDER — SACUBITRIL-VALSARTAN 97-103 MG PO TABS
1.0000 | ORAL_TABLET | Freq: Two times a day (BID) | ORAL | Status: DC
  Administered 2021-11-19 – 2021-11-20 (×3): 1 via ORAL
  Filled 2021-11-19 (×4): qty 1

## 2021-11-19 MED ORDER — HYDRALAZINE HCL 20 MG/ML IJ SOLN
10.0000 mg | Freq: Once | INTRAMUSCULAR | Status: AC
  Administered 2021-11-19: 10 mg via INTRAVENOUS
  Filled 2021-11-19: qty 1

## 2021-11-19 MED ORDER — BISACODYL 5 MG PO TBEC: 5.0000 mg | DELAYED_RELEASE_TABLET | Freq: Every day | ORAL | Status: DC | PRN

## 2021-11-19 MED ORDER — ENOXAPARIN SODIUM 40 MG/0.4ML IJ SOSY
40.0000 mg | PREFILLED_SYRINGE | INTRAMUSCULAR | Status: DC
  Administered 2021-11-19 – 2021-11-24 (×6): 40 mg via SUBCUTANEOUS
  Filled 2021-11-19 (×5): qty 0.4

## 2021-11-19 MED ORDER — IOHEXOL 350 MG/ML SOLN
100.0000 mL | Freq: Once | INTRAVENOUS | Status: AC | PRN
  Administered 2021-11-19: 100 mL via INTRAVENOUS

## 2021-11-19 MED ORDER — METOCLOPRAMIDE HCL 5 MG/ML IJ SOLN
10.0000 mg | Freq: Once | INTRAMUSCULAR | Status: AC
  Administered 2021-11-19: 10 mg via INTRAVENOUS
  Filled 2021-11-19: qty 2

## 2021-11-19 MED ORDER — CLONIDINE HCL 0.1 MG PO TABS
0.1000 mg | ORAL_TABLET | Freq: Four times a day (QID) | ORAL | Status: DC | PRN
  Administered 2021-11-19 – 2021-11-24 (×2): 0.1 mg via ORAL
  Filled 2021-11-19 (×2): qty 1

## 2021-11-19 MED ORDER — ISOSORBIDE MONONITRATE ER 30 MG PO TB24: 30.0000 mg | ORAL_TABLET | Freq: Every day | ORAL | Status: DC

## 2021-11-19 NOTE — ED Triage Notes (Signed)
Pt c/o central chest pain that radiates to right and left chestx1 wk. Pt c/o SOB, N/V. Pt is eupneic. PT is hypertensive. ?

## 2021-11-19 NOTE — ED Notes (Signed)
Pt off unit to CT

## 2021-11-19 NOTE — Consult Note (Addendum)
?Cardiology Consultation:  ? ?Patient ID: Emily Key ?MRN: [id number]; DOB: [birth year] ? ?Admit date: 11/19/2021 ?Date of Consult: 11/19/2021 ? ?PCP:  Vevelyn Francois, NP ?  ?[facility] Providers ?Cardiologist: Dr. Haroldine Laws  ? ? ?Patient Profile:  ? ?Emily Key is a 51 y.o. female with a hx of chronic systolic congestive heart failure, hypertension, hyperlipidemia, nonobstructive CAD, moderate MR and pulmonary hypertension who is being seen 11/19/2021 for the evaluation of hypertensive urgency at the request of Dr. Candiss Norse. ? ?In May 2016, she underwent left VATS w/ drainage of empyema/ abscess and decortication by Dr. Roxan Hockey. Echocardiogram showed normal EF 50-55%.  ?  ?She was admitted again 02/2015 w/ gastroenteritis/ sepsis and HTN urgency. Cardiology consulted for elevated troponin. Echo showed mildly reduced LVEF 40-45%, RV normal. LHC showed normal coronaries. Drop in EF felt to be stress induced. Unfortunately, she did not f/u w/ cardiology following that hospitalization. ?  ?Presented to ED on 11/20/20 w/ CP and exertional dyspnea. She had stopped taking all of her BP meds ~4 months ago due to finances. In ED, BP 220/137. She was admitted and restarted on PO antihypertensives and IV Lasix. Echo showed severely reduced EF, down to 20-25%, GIIIDD, no LVH, Global HK, RV mildly reduced, RVSP 60 mmHg, mod MR, Mod TR. cMRI showed severely reduced LVEF, moderately reduced RV, small pericardia effusion. Cath with mild CAD.  Findings consistent with HTN heart disease and pulmonary HTN. She was started on GDMT. D/c weight 153 lbs. ? ?Last seen in Heart Failure clinic 03/02/2021. ? ?Had normal sleep study 03/2021. ? ?History of Present Illness:  ? ?Ms. Emily Key presented for evaluation of persistent left-sided chest discomfort described as sharp.  Intermittent radiating to her back and lower abdomen.  Also reported abdominal pain and shortness of breath.  Symptoms may be worsened with movement.  Laying on her  belly makes pain and breathing better.  Reports taking her morning medication but intermittently missing evening dose.  Blood pressure severely elevated in the emergency room.  Given IV hydralazine 10 mg.  BP remain elevated blood pressure of 188/111.  No fever, chills, cough, congestion, lower extremity edema or palpitation. ? ?Creatinine 1.03 ?Potassium 3.6 ?High sensitive troponin 32>>21 ?BNP 1619 ?CT angio of chest/abdomen/pelvis without dissection.  Mild cardiomegaly. ?Chest x-ray without acute cardiopulmonary disease ? ? ?Past Medical History:  ?Diagnosis Date  ? Cerumen impaction 01/2019  ? CHF (congestive heart failure) ([place])   ? after last pregnancy  ? Daily headache   ? Heart murmur   ? "born w/one":  ? History of hiatal hernia   ? Hyperlipidemia 01/2019  ? Hypertension   ? Hypertensive emergency 01/02/2019  ? Observed seizure-like activity ([place]) 01/02/2019  ? Pneumonia 11/2014  ? Seasonal allergies 01/2019  ? Vitamin D deficiency 01/2019  ? ? ?Past Surgical History:  ?Procedure Laterality Date  ? CARDIAC CATHETERIZATION N/A 02/13/2015  ? Procedure: Left Heart Cath and Coronary Angiography;  Surgeon: Troy Sine, MD;  Location: [facility];  Service: Cardiovascular;  Laterality: N/A;  ? PLEURAL EFFUSION DRAINAGE Left 11/14/2014  ? Procedure: DRAINAGE OF LEFT PLEURAL EFFUSION;  Surgeon: Melrose Nakayama, MD;  Location: [place];  Service: Thoracic;  Laterality: Left;  ? RIGHT/LEFT HEART CATH AND CORONARY ANGIOGRAPHY N/A 12/24/2020  ? Procedure: RIGHT/LEFT HEART CATH AND CORONARY ANGIOGRAPHY;  Surgeon: Jolaine Artist, MD;  Location: [facility];  Service: Cardiovascular;  Laterality: N/A;  ? TONSILLECTOMY AND ADENOIDECTOMY  ~ 1986  ? TUBAL  LIGATION  2000  ? VIDEO ASSISTED THORACOSCOPY (VATS)/DECORTICATION Left 11/14/2014  ? Procedure: LEFT VIDEO ASSISTED THORACOSCOPY (VATS)/DECORTICATION;  Surgeon: Melrose Nakayama, MD;  Location: [place];  Service: Thoracic;  Laterality: Left;   ? ? ?Inpatient Medications: ?Scheduled Meds: ? [START ON 11/20/2021] amLODipine  10 mg Oral Daily  ? carvedilol  25 mg Oral BID WC  ? [START ON 11/20/2021] citalopram  20 mg Oral Daily  ? dapagliflozin propanediol  10 mg Oral Daily  ? enoxaparin (LOVENOX) injection  40 mg Subcutaneous Q24H  ? furosemide  40 mg Intravenous Once  ? hydrALAZINE  100 mg Oral TID  ? isosorbide mononitrate  60 mg Oral Daily  ? sacubitril-valsartan  1 tablet Oral BID  ? ?Continuous Infusions: ? ?PRN Meds: ?acetaminophen **OR** acetaminophen, albuterol, bisacodyl, hydrALAZINE, ondansetron **OR** ondansetron (ZOFRAN) IV ? ?Allergies:   No Known Allergies ? ?Social History:   ?Social History  ? ?Socioeconomic History  ? Marital status: Divorced  ?  Spouse name: Not on file  ? Number of children: Not on file  ? Years of education: Not on file  ? Highest education level: Not on file  ?Occupational History  ? Not on file  ?Tobacco Use  ? Smoking status: Former  ?  Packs/day: 0.33  ?  Years: 10.00  ?  Pack years: 3.30  ?  Types: Cigarettes  ?  Quit date: 11/11/2014  ?  Years since quitting: 7.0  ? Smokeless tobacco: Never  ?Substance and Sexual Activity  ? Alcohol use: Yes  ?  Alcohol/week: 0.0 standard drinks  ?  Comment: 8/1/20176 "a couple beers a couple times/month"  ? Drug use: No  ? Sexual activity: Yes  ?Other Topics Concern  ? Not on file  ?Social History Narrative  ? Not on file  ? ?Social Determinants of Health  ? ?Financial Resource Strain: High Risk  ? Difficulty of Paying Living Expenses: Hard  ?Food Insecurity: Food Insecurity Present  ? Worried About Charity fundraiser in the Last Year: Sometimes true  ? Ran Out of Food in the Last Year: Sometimes true  ?Transportation Needs: Unmet Transportation Needs  ? Lack of Transportation (Medical): Yes  ? Lack of Transportation (Non-Medical): Yes  ?Physical Activity: Not on file  ?Stress: Not on file  ?Social Connections: Not on file  ?Intimate Partner Violence: Not on file  ?  ?Family  History:   ?Family History  ?Problem Relation Age of Onset  ? Diabetes Mellitus II Mother   ? Hypertension Mother   ? Lung cancer Father   ?  ? ?ROS:  ?Please see the history of present illness.  ?All other ROS reviewed and negative.    ? ?Physical Exam/Data:  ? ?Vitals:  ? 11/19/21 1530 11/19/21 1534 11/19/21 1603 11/19/21 1615  ?BP:   (!) 181/111 (!) 189/132  ?Pulse: 80 70    ?Resp: 18 19    ?Temp:      ?TempSrc:      ?SpO2: 100% 99%    ?Weight:      ?Height:      ? ?No intake or output data in the 24 hours ending 11/19/21 1619 ? ?  11/19/2021  ? 11:30 AM 11/19/2021  ? 10:55 AM 03/29/2021  ?  7:59 PM  ?Last 3 Weights  ?Weight (lbs) 180 lb 169 lb 15.6 oz 170 lb  ?Weight (kg) 81.647 kg 77.1 kg 77.111 kg  ?   ?Body mass index is 28.19 kg/m?.  ?General:  Well  nourished, well developed, in no acute distress ?HEENT: normal ?Neck: no JVD ?Vascular: No carotid bruits; Distal pulses 2+ bilaterally ?Cardiac:  normal S1, S2; RRR; no murmur  ?Lungs:  clear to auscultation bilaterally, no wheezing, rhonchi or rales  ?Abd: soft, nontender, no hepatomegaly  ?Ext: no edema ?Musculoskeletal:  No deformities, BUE and BLE strength normal and equal ?Skin: warm and dry  ?Neuro:  CNs 2-12 intact, no focal abnormalities noted ?Psych:  Normal affect  ? ?EKG:  The EKG was personally reviewed and demonstrates: Sinus rhythm, repolarization abnormality, PVC ?Telemetry:  Telemetry was personally reviewed and demonstrates:  SR ? ?Relevant CV Studies: ? ?-R/LHC 12/24/20:  ?Prox LAD lesion is 45% stenosed. ?  ?Findings: ?  ?Ao = 177/94 (126) ?LV = 176/18 ?RA = 5 ?RV = 64/9 ?PA = 64/17 (35) ?PCW = 18 ?Fick cardiac output/index = 5.0/2.8 ?PVR = 3.4 WU ?Ao sat = 97% ?PA sat = 68%, 69% ?  ?Assessment: ?1. Mild CAD ?2. Severe NICM likely due to HTN CM ?3. 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 Left-sided pressures relatively well compemsated. ?  ?- Renal artery u/s (6/22): no RAS or FMD ?  ?- cMRI (6/22): Severely reduced LV systolic function with  moderate LV dilation, LVEF 22%. Global hypokinesis. Moderately reduced RV systolic function with mild RV enlargement. RVEF 35%. Delayed myocardial enhancement in the superior and inferior RV insertion points, with extension to

## 2021-11-19 NOTE — ED Provider Notes (Signed)
?[facility] ?Provider Note ? ? ?CSN: [id number] ?Arrival date & time: 11/19/21  1048 ? ?  ? ?History ? ?Chief Complaint  ?Patient presents with  ? Chest Pain  ? ? ?Emily Key is a 51 y.o. female. With past medical history of CKD, HTN, NSTEMI, CHF who presents to the emergency department with chest pain.  ? ?States she has had central, dull chest pain intermittently over the past 1 week.  Radiates to the right chest.  Has had associated numbness on the right hand.  States she has pain with laying on the left side.  She has associated shortness of breath that has been increasing all week.  She is sleeping on 4 pillows which is increased over the past week.  Also endorses intermittent lightheadedness and dizziness as well as diaphoresis.  She denies any lower extremity swelling.  States that she has been taking all of her antihypertensives and Lasix, Aldactone as prescribed.  Currently is not seeing her cardiologist. ? ?Most recent cardiac MR 12/25/20: LVEF 22%, severe global hypokineses  ? ? ?Chest Pain ?Associated symptoms: cough, diaphoresis, dizziness, nausea and shortness of breath   ?Associated symptoms: no palpitations   ? ?  ? ?Home Medications ?Prior to Admission medications   ?Medication Sig Start Date End Date Taking? Authorizing Provider  ?amLODipine (NORVASC) 5 MG tablet Take 1 tablet (5 mg total) by mouth daily. 03/02/21 03/02/22  Rafael Bihari, FNP  ?carvedilol (COREG) 25 MG tablet Take 1 tablet (25 mg total) by mouth 2 (two) times daily with a meal. 02/05/21   Vevelyn Francois, NP  ?citalopram (CELEXA) 20 MG tablet Take 1 tablet (20 mg total) by mouth daily. 02/05/21   Vevelyn Francois, NP  ?dapagliflozin propanediol (FARXIGA) 10 MG TABS tablet Take 1 tablet (10 mg total) by mouth daily. 02/05/21   Vevelyn Francois, NP  ?furosemide (LASIX) 20 MG tablet Take 2 tablets (40 mg total) by mouth as needed for fluid or edema. 02/05/21 02/05/22  Vevelyn Francois, NP   ?hydrALAZINE (APRESOLINE) 100 MG tablet Take 1 tablet (100 mg total) by mouth 3 (three) times daily. 02/17/21   Bensimhon, Shaune Pascal, MD  ?isosorbide mononitrate (IMDUR) 30 MG 24 hr tablet Take 1 tablet (30 mg total) by mouth daily. 02/05/21   Vevelyn Francois, NP  ?sacubitril-valsartan (ENTRESTO) 97-103 MG Take 1 tablet by mouth 2 (two) times daily. 02/05/21   Vevelyn Francois, NP  ?spironolactone (ALDACTONE) 25 MG tablet Take 1 tablet (25 mg total) by mouth daily. 02/05/21   Vevelyn Francois, NP  ?   ? ?Allergies    ?Patient has no known allergies.   ? ?Review of Systems   ?Review of Systems  ?Constitutional:  Positive for diaphoresis.  ?Respiratory:  Positive for cough, chest tightness and shortness of breath.   ?Cardiovascular:  Positive for chest pain. Negative for palpitations and leg swelling.  ?Gastrointestinal:  Positive for nausea.  ?Neurological:  Positive for dizziness and light-headedness.  ?All other systems reviewed and are negative. ? ?Physical Exam ?Updated Vital Signs ?BP (!) 198/131 (BP Location: Right Arm)   Pulse (!) 105   Temp (!) 97.5 ?F (36.4 ?C) (Oral)   Resp 18   Ht '5\' 6"'$  (1.676 m)   Wt 77.1 kg   SpO2 98%   BMI 27.43 kg/m?  ?Physical Exam ?Vitals and nursing note reviewed.  ?Constitutional:   ?   General: She is not in acute distress. ?  Appearance: Normal appearance. She is well-developed. She is ill-appearing. She is not toxic-appearing.  ?HENT:  ?   Head: Normocephalic and atraumatic.  ?   Mouth/Throat:  ?   Mouth: Mucous membranes are moist.  ?   Pharynx: Oropharynx is clear.  ?Eyes:  ?   General: No scleral icterus. ?   Extraocular Movements: Extraocular movements intact.  ?Cardiovascular:  ?   Rate and Rhythm: Normal rate and regular rhythm.  ?   Pulses: Normal pulses.     ?     Radial pulses are 2+ on the right side and 2+ on the left side.  ?   Heart sounds: Murmur heard.  ?Systolic murmur is present.  ?Pulmonary:  ?   Effort: Pulmonary effort is normal. Tachypnea present. No  respiratory distress.  ?   Breath sounds: Normal breath sounds.  ?Abdominal:  ?   General: Bowel sounds are normal.  ?   Palpations: Abdomen is soft.  ?Musculoskeletal:     ?   General: Normal range of motion.  ?   Cervical back: Normal range of motion and neck supple.  ?   Right lower leg: No edema.  ?   Left lower leg: No edema.  ?Skin: ?   General: Skin is warm and dry.  ?   Capillary Refill: Capillary refill takes less than 2 seconds.  ?   Coloration: Skin is not cyanotic.  ?Neurological:  ?   General: No focal deficit present.  ?   Mental Status: She is alert and oriented to person, place, and time. Mental status is at baseline.  ?Psychiatric:     ?   Mood and Affect: Mood normal.     ?   Behavior: Behavior normal.     ?   Thought Content: Thought content normal.     ?   Judgment: Judgment normal.  ? ? ?ED Results / Procedures / Treatments   ?Labs ?(all labs ordered are listed, but only abnormal results are displayed) ?Labs Reviewed  ?BASIC METABOLIC PANEL - Abnormal; Notable for the following components:  ?    Result Value  ? Glucose, Bld 140 (*)   ? Creatinine, Ser 1.03 (*)   ? All other components within normal limits  ?BRAIN NATRIURETIC PEPTIDE - Abnormal; Notable for the following components:  ? B Natriuretic Peptide 1,619.6 (*)   ? All other components within normal limits  ?HEPATIC FUNCTION PANEL - Abnormal; Notable for the following components:  ? Bilirubin, Direct 0.3 (*)   ? All other components within normal limits  ?TROPONIN I (HIGH SENSITIVITY) - Abnormal; Notable for the following components:  ? Troponin I (High Sensitivity) 32 (*)   ? All other components within normal limits  ?CBC  ?LIPASE, BLOOD  ?I-STAT BETA HCG BLOOD, ED ([facility], [facility], [facility] ONLY)  ?TROPONIN I (HIGH SENSITIVITY)  ? ?EKG ?EKG Interpretation ? ?Date/Time:  Friday Nov 19 2021 10:55:12 EDT ?Ventricular Rate:  87 ?PR Interval:  158 ?QRS Duration: 116 ?QT Interval:  422 ?QTC Calculation: 507 ?R Axis:   -60 ?Text Interpretation: Sinus rhythm  with sinus arrhythmia with occasional Premature ventricular complexes Right atrial enlargement Pulmonary disease pattern Left anterior fascicular block Left ventricular hypertrophy with QRS widening and repolarization abnormality ( R in aVL , Cornell product , Romhilt-Estes ) Prolonged QT Abnormal ECG When compared with ECG of 19-Jan-2021 10:46, PREVIOUS ECG IS PRESENT Rate faster Confirmed by Ezequiel Essex [id number]) on 11/19/2021 11:32:59 AM ? ?Radiology ?DG Chest 2 View ? ?  Result Date: 11/19/2021 ?CLINICAL DATA:  Central chest pain 1 week. Shortness of breath with nausea and vomiting. EXAM: CHEST - 2 VIEW COMPARISON:  12/21/2020 FINDINGS: Lungs are adequately inflated without focal airspace consolidation or effusion. Minimal stable blunting of the left costophrenic angle. Cardiomediastinal silhouette and remainder of the exam is unchanged. IMPRESSION: No acute cardiopulmonary disease. Electronically Signed   By: Marin Olp M.D.   On: 11/19/2021 11:29   ? ?Procedures ?Procedures  ? ?Medications Ordered in ED ?Medications  ?hydrALAZINE (APRESOLINE) injection 10 mg (10 mg Intravenous Given 11/19/21 1217)  ?metoCLOPramide (REGLAN) injection 10 mg (10 mg Intravenous Given 11/19/21 1231)  ?iohexol (OMNIPAQUE) 350 MG/ML injection 100 mL (100 mLs Intravenous Contrast Given 11/19/21 1338)  ? ? ?ED Course/ Medical Decision Making/ A&P ?  ?                        ?Medical Decision Making ?Amount and/or Complexity of Data Reviewed ?Labs: ordered. ?Radiology: ordered. ? ?Risk ?Prescription drug management. ?Decision regarding hospitalization. ? ?This patient presents to the ED for concern of chest pain, this involves an extensive number of treatment options, and is a complaint that carries with it a high risk of complications and morbidity.  The differential diagnosis includes Acute chest syndrome, stable angina, atypical angina, pulmonary embolism, pneumothorax, dissection, pleural effusion, CHF, COPD, asthma, myocarditis,  pericarditis, chest wall pain  ? ?Co morbidities that complicate the patient evaluation ?CHF hypertension CKD NSTEMI  ? ?Additional history obtained:  ?Additional history obtained from: None ?External records from outside sou

## 2021-11-19 NOTE — H&P (Signed)
?                                                                                               ? ?                                                                                                       TRH H&P ? ? Patient Demographics:  ? ? Emily Key, is a 51 y.o. female  MRN: [id number]   DOB - May 03, 1971 ? ?Admit Date - 11/19/2021 ? ?Outpatient Primary MD for the patient is Vevelyn Francois, NP ? ?Outpatient Specialists: Dr. Ronna Polio ? ?Patient coming from: Home ? ?Chief Complaint  ?Patient presents with  ? Chest Pain  ?  ? ? HPI:  ? ? Emily Key  is a 51 y.o. 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  She also has some dysuria for the last 24 hours, she presented to the ER with the symptoms was found to have extremely elevated blood pressure with nonspecific chest pain and nonspecific EKG changes, troponin was negative.  I was called to admit the patient for chest pain work-up along with hypertensive urgency. ? ? ? Review of systems:  ? ?A full 10 point Review of Systems was done, except as stated above, all other Review of Systems were negative. ? ? ?With Past History of the following :  ? ? ?Past Medical History:  ?Diagnosis Date  ? Cerumen impaction 01/2019  ? CHF (congestive heart failure) ([place])   ? after last pregnancy  ? Daily headache   ? Heart murmur   ? "born w/one":  ? History of hiatal hernia   ? Hyperlipidemia 01/2019  ? Hypertension   ? Hypertensive emergency  01/02/2019  ? Observed seizure-like activity ([place]) 01/02/2019  ? Pneumonia 11/2014  ? Seasonal allergies 01/2019  ? Vitamin D deficiency 01/2019  ?   ? ?Past Surgical History:  ?Procedure Laterality Date  ? CARDIAC CATHETERIZATION N/A 02/13/2015  ? Procedure: Left Heart Cath and Coronary Angiography;  Surgeon: Troy Sine, MD;  Location: [facility];  Service: Cardiovascular;  Laterality: N/A;  ? PLEURAL EFFUSION DRAINAGE Left 11/14/2014  ? Procedure: DRAINAGE OF LEFT PLEURAL EFFUSION;  Surgeon: Melrose Nakayama, MD;  Location: [facility];  Service: Thoracic;  Laterality: Left;  ? RIGHT/LEFT HEART CATH AND CORONARY ANGIOGRAPHY N/A 12/24/2020  ? Procedure: RIGHT/LEFT HEART CATH AND CORONARY ANGIOGRAPHY;  Surgeon: Jolaine Artist, MD;  Location: [facility];  Service: Cardiovascular;  Laterality: N/A;  ? TONSILLECTOMY AND ADENOIDECTOMY  ~ 1986  ? TUBAL LIGATION  2000  ? VIDEO ASSISTED THORACOSCOPY (VATS)/DECORTICATION Left 11/14/2014  ? Procedure: LEFT VIDEO ASSISTED THORACOSCOPY (VATS)/DECORTICATION;  Surgeon: Melrose Nakayama, MD;  Location: [facility];  Service: Thoracic;  Laterality: Left;  ? ? ? ? Social History:  ? ?  ?Social History  ? ?Tobacco Use  ? Smoking status: Former  ?  Packs/day: 0.33  ?  Years: 10.00  ?  Pack years: 3.30  ?  Types: Cigarettes  ?  Quit date: 11/11/2014  ?  Years since quitting: 7.0  ? Smokeless tobacco: Never  ?Substance Use Topics  ? Alcohol use: Yes  ?  Alcohol/week: 0.0 standard drinks  ?  Comment: 8/1/20176 "a couple beers a couple times/month"  ?  ?  ? ? Family History :  ? ?  ?Family History  ?Problem Relation Age of Onset  ? Diabetes Mellitus II Mother   ? Hypertension Mother   ? Lung cancer Father   ?  ? ? Home Medications:  ? ?Prior to Admission medications   ?Medication Sig Start Date End Date Taking? Authorizing Provider  ?amLODipine (NORVASC) 5 MG tablet Take 1 tablet (5 mg total) by mouth daily. 03/02/21 03/02/22 Yes Milford, Maricela Bo, FNP  ?carvedilol (COREG) 25  MG tablet Take 1 tablet (25 mg total) by mouth 2 (two) times daily with a meal. 02/05/21  Yes Vevelyn Francois, NP  ?citalopram (CELEXA) 20 MG tablet Take 1 tablet (20 mg total) by mouth daily. 02/05/21  Yes Vevelyn Francois, NP  ?dapagliflozin propanediol (FARXIGA) 10 MG TABS tablet Take 1 tablet (10 mg total) by mouth daily. 02/05/21  Yes Vevelyn Francois, NP  ?furosemide (LASIX) 20 MG tablet Take 2 tablets (40 mg total) by mouth as needed for fluid or edema. 02/05/21 02/05/22 Yes Vevelyn Francois, NP  ?hydrALAZINE (APRESOLINE) 100 MG tablet Take 1 tablet (100 mg total) by mouth 3 (three) times daily. 02/17/21  Yes Bensimhon, Shaune Pascal, MD  ?isosorbide mononitrate (IMDUR) 30 MG 24 hr tablet Take 1 tablet (30 mg total) by mouth daily. 02/05/21  Yes Vevelyn Francois, NP  ?sacubitril-valsartan (ENTRESTO) 97-103 MG Take 1 tablet by mouth 2 (two) times daily. 02/05/21  Yes Vevelyn Francois, NP  ?spironolactone (ALDACTONE) 25 MG tablet Take 1 tablet (25 mg total) by mouth daily. 02/05/21  Yes Vevelyn Francois, NP  ? ? ? Allergies:  ? ? No Known Allergies ? ? Physical Exam:  ? ?Vitals ? ?Blood pressure (!) 181/111, pulse 70, temperature 98.1 ?F (36.7 ?C), temperature source Oral, resp. rate 19, height '5\' 7"'$  (1.702 m), weight 81.6 kg, SpO2 99 %. ? ? ?1. General middle American female lying in hospital bed in no apparent distress, ? ?2. Normal affect and insight, Not Suicidal or Homicidal, Awake Alert,  ? ?3. No F.N deficits, ALL C.Nerves Intact, Strength 5/5 all 4 extremities, Sensation intact all 4 extremities, Plantars down going. ? ?4. Ears and Eyes appear Normal, Conjunctivae clear, PERRLA. Moist Oral Mucosa. ? ?5. Supple Neck, No JVD, No cervical lymphadenopathy appriciated, No Carotid Bruits. ? ?6. Symmetrical Chest wall movement, Good air movement bilaterally, few rales ? ?7. RRR, No Gallops, Rubs or Murmurs, No  Parasternal Heave. ? ?8. Positive Bowel Sounds, Abdomen Soft, No tenderness, No organomegaly appriciated,No rebound  -guarding or rigidity. ? ?9.  No Cyanosis, Normal Skin Turgor, No Skin Rash or Bruise. ? ?10. Good muscle tone,  joints appear normal , no effusions, Normal ROM. ? ?11. No Palpable Lymph Nodes in Neck or Axillae ? ?  ? ? Data Review:  ? ? CBC ?Recent Labs  ?Lab 11/19/21 ?1100  ?WBC 7.2  ?HGB 13.2  ?HCT 40.6  ?PLT 282  ?MCV 81.4  ?MCH 26.5  ?MCHC 32.5  ?RDW 14.6  ? ?------------------------------------------------------------------------------------------------------------------ ? ?Chemistries  ?Recent Labs  ?Lab 11/19/21 ?1100 11/19/21 ?1219  ?NA 141  --   ?K 3.6  --   ?CL 107  --   ?CO2 23  --   ?GLUCOSE 140*  --   ?BUN 17  --   ?CREATININE 1.03*  --   ?CALCIUM 9.3  --   ?AST  --  22  ?ALT  --  21  ?ALKPHOS  --  95  ?BILITOT  --  0.9  ? ?------------------------------------------------------------------------------------------------------------------ ?estimated creatinine clearance is 70.2 mL/min (A) (by C-G formula based on SCr of 1.03 mg/dL (H)). ?------------------------------------------------------------------------------------------------------------------ ?No results for input(s): TSH, T4TOTAL, T3FREE, THYROIDAB in the last 72 hours. ? ?Invalid input(s): FREET3 ? ?Coagulation profile ?No results for input(s): INR, PROTIME in the last 168 hours. ?------------------------------------------------------------------------------------------------------------------- ?No results for input(s): DDIMER in the last 72 hours. ?------------------------------------------------------------------------------------------------------------------- ? ?Cardiac Enzymes ?No results for input(s): CKMB, TROPONINI, MYOGLOBIN in the last 168 hours. ? ?Invalid input(s): CK ?------------------------------------------------------------------------------------------------------------------ ?   ?Component Value Date/Time  ? BNP 1,619.6 (H) 11/19/2021 1219   ? ? ? ?--------------------------------------------------------------------------------------------------------------- ? ?Urinalysis ?   ?Component Value Date/Time  ? [place] YELLOW 01/02/2019 0354  ? APPEARANCEUR CLEAR 01/02/2019 0354  ? LABSPEC 1.012 01/02/2019 0354  ? PHURINE 6.0 06/24

## 2021-11-20 ENCOUNTER — Observation Stay (HOSPITAL_COMMUNITY): Payer: 59

## 2021-11-20 DIAGNOSIS — I160 Hypertensive urgency: Secondary | ICD-10-CM

## 2021-11-20 DIAGNOSIS — I5031 Acute diastolic (congestive) heart failure: Secondary | ICD-10-CM

## 2021-11-20 DIAGNOSIS — R112 Nausea with vomiting, unspecified: Secondary | ICD-10-CM

## 2021-11-20 DIAGNOSIS — R1013 Epigastric pain: Secondary | ICD-10-CM | POA: Diagnosis not present

## 2021-11-20 LAB — ECHOCARDIOGRAM COMPLETE
AR max vel: 1.79 cm2
AV Area VTI: 1.81 cm2
AV Area mean vel: 1.65 cm2
AV Mean grad: 1 mmHg
AV Peak grad: 2.8 mmHg
Ao pk vel: 0.84 m/s
Calc EF: 19.3 %
Height: 67 in
MV VTI: 1.46 cm2
S' Lateral: 5.2 cm
Single Plane A2C EF: 12.8 %
Single Plane A4C EF: 22.7 %
Weight: 2651.2 oz

## 2021-11-20 LAB — COMPREHENSIVE METABOLIC PANEL
ALT: 20 U/L (ref 0–44)
AST: 19 U/L (ref 15–41)
Albumin: 3.9 g/dL (ref 3.5–5.0)
Alkaline Phosphatase: 68 U/L (ref 38–126)
Anion gap: 15 (ref 5–15)
BUN: 31 mg/dL — ABNORMAL HIGH (ref 6–20)
CO2: 23 mmol/L (ref 22–32)
Calcium: 9.3 mg/dL (ref 8.9–10.3)
Chloride: 96 mmol/L — ABNORMAL LOW (ref 98–111)
Creatinine, Ser: 1.67 mg/dL — ABNORMAL HIGH (ref 0.44–1.00)
GFR, Estimated: 37 mL/min — ABNORMAL LOW (ref >60)
Glucose, Bld: 201 mg/dL — ABNORMAL HIGH (ref 70–99)
Potassium: 3.6 mmol/L (ref 3.5–5.1)
Sodium: 134 mmol/L — ABNORMAL LOW (ref 135–145)
Total Bilirubin: 1.2 mg/dL (ref 0.3–1.2)
Total Protein: 7.7 g/dL (ref 6.5–8.1)

## 2021-11-20 LAB — CBC
HCT: 49.1 % — ABNORMAL HIGH (ref 36.0–46.0)
Hemoglobin: 16.4 g/dL — ABNORMAL HIGH (ref 12.0–15.0)
MCH: 26.5 pg (ref 26.0–34.0)
MCHC: 33.4 g/dL (ref 30.0–36.0)
MCV: 79.3 fL — ABNORMAL LOW (ref 80.0–100.0)
Platelets: 324 10*3/uL (ref 150–400)
RBC: 6.19 MIL/uL — ABNORMAL HIGH (ref 3.87–5.11)
RDW: 14.7 % (ref 11.5–15.5)
WBC: 9.1 10*3/uL (ref 4.0–10.5)
nRBC: 0 % (ref 0.0–0.2)

## 2021-11-20 LAB — HEMOGLOBIN A1C
Hgb A1c MFr Bld: 6.1 % — ABNORMAL HIGH (ref 4.8–5.6)
Mean Plasma Glucose: 128.37 mg/dL

## 2021-11-20 LAB — BRAIN NATRIURETIC PEPTIDE: B Natriuretic Peptide: 482.7 pg/mL — ABNORMAL HIGH (ref 0.0–100.0)

## 2021-11-20 LAB — MAGNESIUM: Magnesium: 2.7 mg/dL — ABNORMAL HIGH (ref 1.7–2.4)

## 2021-11-20 MED ORDER — POTASSIUM CHLORIDE 10 MEQ/100ML IV SOLN
10.0000 meq | INTRAVENOUS | Status: AC
  Administered 2021-11-20 (×2): 10 meq via INTRAVENOUS
  Filled 2021-11-20 (×2): qty 100

## 2021-11-20 MED ORDER — SODIUM CHLORIDE 0.9 % IV SOLN
12.5000 mg | Freq: Four times a day (QID) | INTRAVENOUS | Status: DC | PRN
  Administered 2021-11-20: 12.5 mg via INTRAVENOUS
  Filled 2021-11-20 (×2): qty 0.5

## 2021-11-20 MED ORDER — ALUM & MAG HYDROXIDE-SIMETH 200-200-20 MG/5ML PO SUSP
60.0000 mL | Freq: Once | ORAL | Status: AC
  Administered 2021-11-20: 60 mL via ORAL
  Filled 2021-11-20: qty 60

## 2021-11-20 MED ORDER — PANTOPRAZOLE SODIUM 40 MG PO TBEC
40.0000 mg | DELAYED_RELEASE_TABLET | Freq: Two times a day (BID) | ORAL | Status: DC
  Administered 2021-11-20: 40 mg via ORAL
  Filled 2021-11-20: qty 1

## 2021-11-20 MED ORDER — ONDANSETRON HCL 4 MG PO TABS
4.0000 mg | ORAL_TABLET | Freq: Four times a day (QID) | ORAL | Status: AC
  Administered 2021-11-20 – 2021-11-21 (×5): 4 mg via ORAL
  Filled 2021-11-20 (×6): qty 1

## 2021-11-20 MED ORDER — MAGNESIUM SULFATE IN D5W 1-5 GM/100ML-% IV SOLN
1.0000 g | Freq: Once | INTRAVENOUS | Status: AC
  Administered 2021-11-20: 1 g via INTRAVENOUS
  Filled 2021-11-20: qty 100

## 2021-11-20 MED ORDER — ONDANSETRON HCL 4 MG/2ML IJ SOLN
4.0000 mg | Freq: Four times a day (QID) | INTRAMUSCULAR | Status: AC
  Administered 2021-11-22 (×3): 4 mg via INTRAVENOUS
  Filled 2021-11-20 (×3): qty 2

## 2021-11-20 MED ORDER — PANTOPRAZOLE SODIUM 40 MG IV SOLR
40.0000 mg | INTRAVENOUS | Status: DC
  Administered 2021-11-20 – 2021-11-22 (×3): 40 mg via INTRAVENOUS
  Filled 2021-11-20 (×3): qty 10

## 2021-11-20 MED ORDER — ENSURE ENLIVE PO LIQD: 237.0000 mL | Freq: Two times a day (BID) | ORAL | Status: DC

## 2021-11-20 NOTE — Progress Notes (Signed)
Repaged card on call Mayl ,  ?Dr Ronnie Derby  made awrare of short vtach ( 7 beats) asymptomatic  episode . Magnesium 1 gm Iv ordered. Marland Kitchen ?

## 2021-11-20 NOTE — Evaluation (Signed)
Physical Therapy Evaluation ?Patient Details ?Name: Emily Key ?MRN: [id number] ?DOB: 09-02-1970 ?Today's Date: 11/20/2021 ? ?History of Present Illness ? The pt is a 51 yo female presenting 5/12 with chest pain that radiates to R and L side x 1 week with SOB and nausea. Pt found to have HTN emergency and acute CHF exacerbation. PMH includes: CKD, poorly controlled HTN, NSTEMI, and CHF. ?  ?Clinical Impression ? Pt in bed upon arrival of PT, agreeable to evaluation at this time. Prior to admission the pt was mobilizing without need for DME or assist, reports fully independent with all mobility and ADLs. The pt presents with significant lethargy and fatigue, was initially agreeable to session and answering questions, but needed continued repeated questions and tactile stimulation to maintain alertness. Pt agreed to sitting EOB in attempt to wake up, and was able to do so without assist, but once in sitting reports fatigue, feeling very hot, and continued to impulsively return to sidelying position without warning. BP in sitting: 94/61 (70). At this point, session limited by pt fatigue, reports of sx with softer BP, and return of staff to complete echo. Will plan to further assess OOB mobility as time/schedule allow, but anticipate the pt will need HHPT to progress strength, activity tolerance, and stability in order to return to full independence, but she does have good family support available to assist at home.  ? ?BP in sitting: 94/61 (70) with pt c/o feeling hot, tired, and clammy ?BP in supine: 108/70 (83) ?   ?   ? ?Recommendations for follow up therapy are one component of a multi-disciplinary discharge planning process, led by the attending physician.  Recommendations may be updated based on patient status, additional functional criteria and insurance authorization. ? ?Follow Up Recommendations Home health PT ? ?  ?Assistance Recommended at Discharge Frequent or constant Supervision/Assistance  ?Patient can  return home with the following ? A little help with walking and/or transfers;A little help with bathing/dressing/bathroom;Assistance with cooking/housework;Assist for transportation;Help with stairs or ramp for entrance ? ?  ?Equipment Recommendations  (defer until pt completes OOB mobility)  ?Recommendations for Other Services ?    ?  ?Functional Status Assessment Patient has had a recent decline in their functional status and demonstrates the ability to make significant improvements in function in a reasonable and predictable amount of time.  ? ?  ?Precautions / Restrictions Precautions ?Precautions: Fall ?Precaution Comments: watch BP ?Restrictions ?Weight Bearing Restrictions: No  ? ?  ? ?Mobility ? Bed Mobility ?Overal bed mobility: Independent ?  ?  ?  ?  ?  ?  ?General bed mobility comments: pt impuslively returning to supine repeatedly through session, would come to sitting EOB with encouragement and increased time, then return to supine without assist (x6-7) ?  ? ?Transfers ?  ?  ?  ?  ?  ?  ?  ?  ?  ?General transfer comment: deferred due to pt inability to maintain alertness, BP 94/61 (74) with pt reporting feeling hot, clammy, and tired. ?  ? ?  ? ?Balance Overall balance assessment: Needs assistance ?Sitting-balance support: Bilateral upper extremity supported, Feet supported ?Sitting balance-Leahy Scale: Fair ?Sitting balance - Comments: pt leaning to L and then impusively returning to supine repeatedly, unable to further assess ?  ?  ?  ?  ?  ?  ?  ?  ?  ?  ?  ?  ?  ?  ?  ?   ? ? ? ?Pertinent Vitals/Pain  Pain Assessment ?Pain Assessment: No/denies pain  ? ? ?Home Living Family/patient expects to be discharged to:: Private residence ?Living Arrangements: Other relatives (sister) ?Available Help at Discharge: Family;Available 24 hours/day ?Type of Home: House ?Home Access: Level entry ?  ?  ?  ?Home Layout: One level ?  ?Additional Comments: pt struggling to maintain alertness during questioning,  reports indepenent, spends most of her time watching young nephews and driving other relatives around during the day.  ?  ?Prior Function Prior Level of Function : Independent/Modified Independent;Driving ?  ?  ?  ?  ?  ?  ?Mobility Comments: reports dully independent with no falls, not working. ?ADLs Comments: pt reports independence ?  ? ? ?Hand Dominance  ?   ? ?  ?Extremity/Trunk Assessment  ? Upper Extremity Assessment ?Upper Extremity Assessment: Generalized weakness ?  ? ?Lower Extremity Assessment ?Lower Extremity Assessment: Generalized weakness ?  ? ?Cervical / Trunk Assessment ?Cervical / Trunk Assessment: Normal  ?Communication  ? Communication: No difficulties  ?Cognition Arousal/Alertness: Lethargic ?Behavior During Therapy: Flat affect ?Overall Cognitive Status: Difficult to assess ?  ?  ?  ?  ?  ?  ?  ?  ?  ?  ?  ?  ?  ?  ?  ?  ?General Comments: difficult to assess due to lethargy and pt having difficulty maintaining alertness. pt answering PLOF questions intermittently then returning to sleep. at one point pt asking "where am I?" and guessing "doctor's office?" when prompted to look around and take best guess. pt apologetic but unable to maintain alertness ?  ?  ? ?  ?General Comments General comments (skin integrity, edema, etc.): BP 94/61 (74) in sitting, 108/70 (83) once returned to supine ? ?  ?   ? ?Assessment/Plan  ?  ?PT Assessment Patient needs continued PT services  ?PT Problem List Decreased activity tolerance;Decreased balance;Decreased mobility;Decreased safety awareness ? ?   ?  ?PT Treatment Interventions DME instruction;Gait training;Functional mobility training;Stair training;Therapeutic activities;Therapeutic exercise;Balance training;Patient/family education   ? ?PT Goals (Current goals can be found in the Care Plan section)  ?Acute Rehab PT Goals ?Patient Stated Goal: to get some rest ?PT Goal Formulation: With patient ?Time For Goal Achievement: 12/04/21 ?Potential to Achieve  Goals: Good ? ?  ?Frequency Min 3X/week ?  ? ? ?   ?AM-PAC PT "6 Clicks" Mobility  ?Outcome Measure Help needed turning from your back to your side while in a flat bed without using bedrails?: A Little ?Help needed moving from lying on your back to sitting on the side of a flat bed without using bedrails?: A Little ?Help needed moving to and from a bed to a chair (including a wheelchair)?: A Little ?Help needed standing up from a chair using your arms (e.g., wheelchair or bedside chair)?: A Little ?Help needed to walk in hospital room?: Total ?Help needed climbing 3-5 steps with a railing? : Total ?6 Click Score: 14 ? ?  ?End of Session   ?Activity Tolerance: Patient limited by fatigue;Patient limited by lethargy ?Patient left: in bed;with call bell/phone within reach (with echo) ?Nurse Communication: Mobility status (BP, echo returned) ?PT Visit Diagnosis: Unsteadiness on feet (R26.81);Muscle weakness (generalized) (M62.81) ?  ? ?Time: 8315-1761 ?PT Time Calculation (min) (ACUTE ONLY): 14 min ? ? ?Charges:   PT Evaluation ?$PT Eval Low Complexity: 1 Low ?  ?  ?   ? ? ?West Carbo, PT, DPT  ? ?Acute Rehabilitation Department ?Pager #: [phone] - [phone] ? ?  Sandra Cockayne ?11/20/2021, 2:06 PM ? ?

## 2021-11-20 NOTE — Progress Notes (Signed)
?                                  PROGRESS NOTE                                             ?                                                                                                                     ?                                         ? ? Patient Demographics:  ? ? Emily Key, is a 51 y.o. female, DOB - 1970/09/20, EPP:[id number] ? ?Outpatient Primary MD for the patient is Vevelyn Francois, NP    LOS - 0  Admit date - 11/19/2021   ? ?Chief Complaint  ?Patient presents with  ? Chest Pain  ?    ? ?Brief Narrative (HPI from H&P)  51 y.o. 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  She also has some dysuria for the last 24 hours. ? ?She was admitted to the hospital for chest pain, acute on chronic CHF, subsequently developed some abdominal discomfort as well. ? ? Subjective:  ? ? Emily Key today has, No headache, improved chest pain, no shortness of breath this morning however she is now having abdominal discomfort. ? ? Assessment  & Plan :  ? ? ?1.  Atypical chest pain with hypertensive urgency/crisis.  Her home medications have been adjusted with improvement in blood pressure, she is also on as needed oral Catapres and IV hydralazine ordered, echocardiogram pending.  Cardiology team following.. ?  ?2.  Hypertensive urgency.  Home medications adjusted further as needed oral Catapres and IV hydralazine added.  Monitor closely. ?  ?3.  Anxiety and depression.  Continue Celexa at home dose. ?  ?4.  Dysuria.   Resolved.  UA stable. ?  ?5.  Acute on chronic combined systolic and diastolic CHF EF 60%.  Plan as in #1 above, responded very well to single dose IV Lasix given on 11/19/2021.  Shortness of breath has resolved.  Continue Coreg and Entresto at home dose along with Aldactone. ? ?6.  Epigastric pain.  She states that she has been having some epigastric  discomfort intermittently for several days, some nausea.  KUB stable, on exam some epigastric tenderness, pain improved after Maalox and PPI.  Continue PPI GI consulted May require EGD. ? ?7.  Asymptomatic 7 beat run of NSVT on 11/20/2021 6 AM.  Already on max dose beta-blocker, EF is known to be around 20%, 1 g mag given, will keep potassium around 4 and give 2 runs of IV potassium on 11/20/2021.  Cardiology on board.  Continue telemetry monitoring. ? ?   ? ?Condition -  Guarded ? ?Family Communication  :  none present ? ?Code Status :  Full ? ?Consults  :  Cards, GI ? ?PUD Prophylaxis : PPI ? ? Procedures  :    ? ?TTE ? ?CTA - No evidence of thoracic or abdominal aortic dissection or aneurysm. Bilateral nonobstructive nephrolithiasis. Mild cardiomegaly. ? ?   ? ?Disposition Plan  :   ? ?Status is: Observation ? ?DVT Prophylaxis  :   ? ?enoxaparin (LOVENOX) injection 40 mg Start: 11/19/21 2000 ? ? ?Lab Results  ?Component Value Date  ? PLT 324 11/20/2021  ? ? ?Diet :  ?Diet Order   ? ?       ?  Diet Heart Room service appropriate? Yes; Fluid consistency: Thin; Fluid restriction: 1500 mL Fluid  Diet effective now       ?  ? ?  ?  ? ?  ?  ? ?Inpatient Medications ? ?Scheduled Meds: ? amLODipine  10 mg Oral Daily  ? carvedilol  25 mg Oral BID WC  ? citalopram  20 mg Oral Daily  ? dapagliflozin propanediol  10 mg Oral Daily  ? enoxaparin (LOVENOX) injection  40 mg Subcutaneous Q24H  ? feeding supplement  237 mL Oral BID BM  ? hydrALAZINE  100 mg Oral TID  ? isosorbide mononitrate  60 mg Oral Daily  ? pantoprazole  40 mg Oral BID AC  ? sacubitril-valsartan  1 tablet Oral BID   ? spironolactone  25 mg Oral Daily  ? ?Continuous Infusions: ? potassium chloride    ? promethazine (PHENERGAN) injection (IM or IVPB)    ? ?PRN Meds:.acetaminophen **OR** acetaminophen, albuterol, alum & mag hydroxide-simeth, bisacodyl, cloNIDine, hydrALAZINE, ondansetron **OR** ondansetron (ZOFRAN) IV, promethazine (PHENERGAN) injection (IM or IVPB) ? ?Antibiotics  :   ? ?Anti-infectives (From admission, onward)  ? ? None  ? ?  ? ? ? Time Spent in minutes  30 ? ? ?Lala Lund M.D on 11/20/2021 at 10:17 AM ? ?To page go to [email or web address]  ? ?[facility] -  Office  [phone] ? ?See all Orders from today for further details ? ? ? Objective:  ? ?Vitals:  ? 11/20/21 0349 11/20/21 0805 11/20/21 1761 11/20/21 0846  ?BP: (!) 120/97 114/89 114/89   ?Pulse: 73     ?Resp:    18  ?Temp: (!) 97.5 ?F (36.4 ?C)     ?TempSrc: Oral     ?SpO2: 92%     ?Weight: 75.2 kg     ?Height:      ? ? ?Wt Readings from Last 3 Encounters:  ?11/20/21 75.2 kg  ?03/29/21 77.1 kg  ?03/02/21 80.2 kg  ? ? ? ?Intake/Output Summary (Last 24 hours) at 11/20/2021 1017 ?Last data filed at 11/20/2021 0557 ?Gross per 24 hour  ?Intake 60 ml  ?Output 400 ml  ?Net -340 ml  ? ? ? ?Physical Exam ? ?Awake Alert, No new F.N deficits, Normal affect ?[place].AT,PERRAL ?Supple Neck, No JVD,   ?  Symmetrical Chest wall movement, Good air movement bilaterally, CTAB ?RRR,No Gallops,Rubs or new Murmurs,  ?+ve B.Sounds, Abd Soft, mild epigastric tenderness,   ?No Cyanosis, Clubbing or edema  ?  ?  ? ? Data Review:  ? ? ?CBC ?Recent Labs  ?Lab 11/19/21 ?1100 11/20/21 ?2694  ?WBC 7.2 9.1  ?HGB 13.2 16.4*  ?HCT 40.6 49.1*  ?PLT 282 324  ?MCV 81.4 79.3*  ?MCH 26.5 26.5  ?MCHC 32.5 33.4  ?RDW 14.6 14.7  ? ? ?Electrolytes ?Recent Labs  ?Lab 11/19/21 ?1100 11/19/21 ?1219 11/20/21 ?8546 11/20/21 ?2703  ?NA 141  --   --  134*  ?K 3.6  --   --  3.6  ?CL 107  --   --  96*  ?CO2 23  --   --  23  ?GLUCOSE 140*  --   --  201*  ?BUN 17  --   --  31*  ?CREATININE 1.03*  --   --  1.67*   ?CALCIUM 9.3  --   --  9.3  ?AST  --  22  --  19  ?ALT  --  21  --  20  ?ALKPHOS  --  62  --  68  ?BILITOT  --  0.9  --  1.2  ?ALBUMIN  --  3.6  --  3.9  ?MG  --   --   --  2.7*  ?TSH  --  0.540  --   --   ?HGBA1C  --   --  6.1*  --   ?BNP  --  1,619.6*  --  482.7*  ? ? ?------------------------------------------------------------------------------------------------------------------ ?No results for input(s): CHOL, HDL, LDLCALC, TRIG, CHOLHDL, LDLDIRECT in the last 72 hours. ? ?Lab Results  ?Component Value Date  ? HGBA1C 6.1 (H) 11/20/2021  ? ? ?Recent Labs  ?  11/19/21 ?1219  ?TSH 0.540  ? ?------------------------------------------------------------------------------------------------------------------ ?ID Labs ?Recent Labs  ?Lab 11/19/21 ?1100 11/20/21 ?5009 11/20/21 ?3818  ?WBC 7.2 9.1  --   ?PLT 282 324  --   ?CREATININE 1.03*  --  1.67*  ? ?Cardiac Enzymes ?No results for input(s): CKMB, TROPONINI, MYOGLOBIN in the last 168 hours. ? ?Invalid input(s): CK ? ? ?  ? ? ?Micro Results ?No results found for this or any previous visit (from the past 240 hour(s)). ? ?Radiology Reports ?DG Chest 2 View ? ?Result Date: 11/19/2021 ?CLINICAL DATA:  Central chest pain 1 week. Shortness of breath with nausea and vomiting. EXAM: CHEST - 2 VIEW COMPARISON:  12/21/2020 FINDINGS: Lungs are adequately inflated without focal airspace consolidation or effusion. Minimal stable blunting of the left costophrenic angle. Cardiomediastinal silhouette and remainder of the exam is unchanged. IMPRESSION: No acute cardiopulmonary disease. Electronically Signed   By: Marin Olp M.D.   On: 11/19/2021 11:29  ? ?DG Abd Portable 1V ? ?Result Date: 11/20/2021 ?CLINICAL DATA:  Nausea and vomiting EXAM: PORTABLE ABDOMEN - 1 VIEW COMPARISON:  01/02/2019 FINDINGS: The bowel gas pattern is normal. Multiple bilateral renal calculi. Tubal ligation clips. Clear lung bases. IMPRESSION: 1. Normal bowel gas pattern. 2. Bilateral nephrolithiasis.  Electronically Signed   By: Jorje Guild M.D.   On: 11/20/2021 08:11  ? ?CT Angio Chest/Abd/Pel for Dissection W and/or Wo Contrast ? ?Result Date: 11/19/2021 ?CLINICAL DATA:  Acute aortic syndrome. EXAM: CT

## 2021-11-20 NOTE — Progress Notes (Signed)
Echo attempted. Patient nauseous and wanted to attempt echo once she received nausea medication. Will attempt again as schedule permits. ?

## 2021-11-20 NOTE — Progress Notes (Signed)
? ?Progress Note ? ?Patient Name: Emily Key ?Date of Encounter: 11/20/2021 ? ?[facility] Cardiologist: Bensimhon ? ?Subjective  ? ?Nausea, abdominal pain this morning.  ? ?Inpatient Medications  ?  ?Scheduled Meds: ? amLODipine  10 mg Oral Daily  ? carvedilol  25 mg Oral BID WC  ? citalopram  20 mg Oral Daily  ? dapagliflozin propanediol  10 mg Oral Daily  ? enoxaparin (LOVENOX) injection  40 mg Subcutaneous Q24H  ? feeding supplement  237 mL Oral BID BM  ? hydrALAZINE  100 mg Oral TID  ? isosorbide mononitrate  60 mg Oral Daily  ? pantoprazole  40 mg Oral BID AC  ? sacubitril-valsartan  1 tablet Oral BID  ? spironolactone  25 mg Oral Daily  ? ?Continuous Infusions: ? potassium chloride    ? promethazine (PHENERGAN) injection (IM or IVPB)    ? ?PRN Meds: ?acetaminophen **OR** acetaminophen, albuterol, alum & mag hydroxide-simeth, bisacodyl, cloNIDine, hydrALAZINE, ondansetron **OR** ondansetron (ZOFRAN) IV, promethazine (PHENERGAN) injection (IM or IVPB)  ? ?Vital Signs  ?  ?Vitals:  ? 11/20/21 0349 11/20/21 0805 11/20/21 6269 11/20/21 0846  ?BP: (!) 120/97 114/89 114/89   ?Pulse: 73     ?Resp:    18  ?Temp: (!) 97.5 ?F (36.4 ?C)     ?TempSrc: Oral     ?SpO2: 92%     ?Weight: 75.2 kg     ?Height:      ? ? ?Intake/Output Summary (Last 24 hours) at 11/20/2021 1039 ?Last data filed at 11/20/2021 0557 ?Gross per 24 hour  ?Intake 60 ml  ?Output 400 ml  ?Net -340 ml  ? ? ?  11/20/2021  ?  3:49 AM 11/19/2021  ?  7:49 PM 11/19/2021  ? 11:30 AM  ?Last 3 Weights  ?Weight (lbs) 165 lb 11.2 oz 165 lb 5.5 oz 180 lb  ?Weight (kg) 75.161 kg 75 kg 81.647 kg  ?   ? ?Telemetry  ?  ?SR, short run NSVT - Personally Reviewed ? ?ECG  ?  ?N/a - Personally Reviewed ? ?Physical Exam  ? ?GEN: No acute distress.   ?Neck: No JVD ?Cardiac: RRR, no murmurs, rubs, or gallops.  ?Respiratory: Clear to auscultation bilaterally. ?GI: Soft, nontender, non-distended  ?MS: No edema; No deformity. ?Neuro:  Nonfocal  ?Psych: Normal affect  ? ?Labs  ?   ?High Sensitivity Troponin:   ?Recent Labs  ?Lab 11/19/21 ?1100 11/19/21 ?1520  ?TROPONINIHS 32* 21*  ?   ?Chemistry ?Recent Labs  ?Lab 11/19/21 ?1100 11/19/21 ?1219 11/20/21 ?4854  ?NA 141  --  134*  ?K 3.6  --  3.6  ?CL 107  --  96*  ?CO2 23  --  23  ?GLUCOSE 140*  --  201*  ?BUN 17  --  31*  ?CREATININE 1.03*  --  1.67*  ?CALCIUM 9.3  --  9.3  ?MG  --   --  2.7*  ?PROT  --  7.1 7.7  ?ALBUMIN  --  3.6 3.9  ?AST  --  22 19  ?ALT  --  21 20  ?ALKPHOS  --  62 70  ?BILITOT  --  0.9 1.2  ?GFRNONAA >60  --  37*  ?ANIONGAP 11  --  15  ?  ?Lipids No results for input(s): CHOL, TRIG, HDL, LABVLDL, LDLCALC, CHOLHDL in the last 168 hours.  ?Hematology ?Recent Labs  ?Lab 11/19/21 ?1100 11/20/21 ?3500  ?WBC 7.2 9.1  ?RBC 4.99 6.19*  ?HGB 13.2 16.4*  ?HCT  40.6 49.1*  ?MCV 81.4 79.3*  ?MCH 26.5 26.5  ?MCHC 32.5 33.4  ?RDW 14.6 14.7  ?PLT 282 324  ? ?Thyroid  ?Recent Labs  ?Lab 11/19/21 ?1219  ?TSH 0.540  ?  ?BNP ?Recent Labs  ?Lab 11/19/21 ?1219 11/20/21 ?0071  ?BNP 1,619.6* 482.7*  ?  ?DDimer No results for input(s): DDIMER in the last 168 hours.  ? ?Radiology  ?  ?DG Chest 2 View ? ?Result Date: 11/19/2021 ?CLINICAL DATA:  Central chest pain 1 week. Shortness of breath with nausea and vomiting. EXAM: CHEST - 2 VIEW COMPARISON:  12/21/2020 FINDINGS: Lungs are adequately inflated without focal airspace consolidation or effusion. Minimal stable blunting of the left costophrenic angle. Cardiomediastinal silhouette and remainder of the exam is unchanged. IMPRESSION: No acute cardiopulmonary disease. Electronically Signed   By: Marin Olp M.D.   On: 11/19/2021 11:29  ? ?DG Abd Portable 1V ? ?Result Date: 11/20/2021 ?CLINICAL DATA:  Nausea and vomiting EXAM: PORTABLE ABDOMEN - 1 VIEW COMPARISON:  01/02/2019 FINDINGS: The bowel gas pattern is normal. Multiple bilateral renal calculi. Tubal ligation clips. Clear lung bases. IMPRESSION: 1. Normal bowel gas pattern. 2. Bilateral nephrolithiasis. Electronically Signed   By: Jorje Guild M.D.   On: 11/20/2021 08:11  ? ?CT Angio Chest/Abd/Pel for Dissection W and/or Wo Contrast ? ?Result Date: 11/19/2021 ?CLINICAL DATA:  Acute aortic syndrome. EXAM: CT ANGIOGRAPHY CHEST, ABDOMEN AND PELVIS TECHNIQUE: Non-contrast CT of the chest was initially obtained. Multidetector CT imaging through the chest, abdomen and pelvis was performed using the standard protocol during bolus administration of intravenous contrast. Multiplanar reconstructed images and MIPs were obtained and reviewed to evaluate the vascular anatomy. RADIATION DOSE REDUCTION: This exam was performed according to the departmental dose-optimization program which includes automated exposure control, adjustment of the mA and/or kV according to patient size and/or use of iterative reconstruction technique. CONTRAST:  180m OMNIPAQUE IOHEXOL 350 MG/ML SOLN COMPARISON:  January 02, 2019.  February 14, 2015. FINDINGS: CTA CHEST FINDINGS Cardiovascular: Preferential opacification of the thoracic aorta. No evidence of thoracic aortic aneurysm or dissection. Mild cardiomegaly is noted. No pericardial effusion. Mediastinum/Nodes: No enlarged mediastinal, hilar, or axillary lymph nodes. Thyroid gland, trachea, and esophagus demonstrate no significant findings. Lungs/Pleura: Lungs are clear. No pleural effusion or pneumothorax. Musculoskeletal: No chest wall abnormality. No acute or significant osseous findings. Review of the MIP images confirms the above findings. CTA ABDOMEN AND PELVIS FINDINGS VASCULAR Aorta: Normal caliber aorta without aneurysm, dissection, vasculitis or significant stenosis. Celiac: Patent without evidence of aneurysm, dissection, vasculitis or significant stenosis. SMA: Patent without evidence of aneurysm, dissection, vasculitis or significant stenosis. Renals: Both renal arteries are patent without evidence of aneurysm, dissection, vasculitis, fibromuscular dysplasia or significant stenosis. IMA: Patent without evidence of aneurysm,  dissection, vasculitis or significant stenosis. Inflow: Patent without evidence of aneurysm, dissection, vasculitis or significant stenosis. Veins: No obvious venous abnormality within the limitations of this arterial phase study. Review of the MIP images confirms the above findings. NON-VASCULAR Hepatobiliary: No focal liver abnormality is seen. No gallstones, gallbladder wall thickening, or biliary dilatation. Pancreas: Unremarkable. No pancreatic ductal dilatation or surrounding inflammatory changes. Spleen: Normal in size without focal abnormality. Adrenals/Urinary Tract: Adrenal glands are unremarkable. Bilateral nonobstructive nephrolithiasis is noted. No hydronephrosis or renal obstruction is noted. Bladder is unremarkable. Stomach/Bowel: Stomach is within normal limits. Appendix appears normal. No evidence of bowel wall thickening, distention, or inflammatory changes. Lymphatic: No definite adenopathy is noted. Reproductive: Uterus and bilateral adnexa are unremarkable. Other: No  abdominal wall hernia or abnormality. No abdominopelvic ascites. Musculoskeletal: No acute or significant osseous findings. Review of the MIP images confirms the above findings. IMPRESSION: No evidence of thoracic or abdominal aortic dissection or aneurysm. Bilateral nonobstructive nephrolithiasis. Mild cardiomegaly. Electronically Signed   By: Marijo Conception M.D.   On: 11/19/2021 13:53   ? ?Cardiac Studies  ? ? ?Patient Profile  ?   ?Emily Key is a 51 y.o. female with a hx of chronic systolic congestive heart failure, hypertension, hyperlipidemia, nonobstructive CAD, moderate MR and pulmonary hypertension who is being seen 11/19/2021 for the evaluation of hypertensive urgency at the request of Dr. Candiss Norse. ? ?Assessment & Plan  ?  ?1.Hypertensive urgency ?- admit 198/131, appears to be some medication noncompliance at home ?- prior renal artery [place] without stenosis ?- currently on norvasc 10, coreg '25mg'$  bid, hydralazine '100mg'$  tid,  entresto 97/'103mg'$  bid, aldactone 25 ?- bp's today 110s/80s ? ? ?2. Chronic combined systolic/diastolic HF ?- 07/4429 echo LVEF 20-25%, grade III dd, mild RV dysfunction, severe pulm HTN, mod MR,  ?6/202

## 2021-11-20 NOTE — Progress Notes (Signed)
?  Echocardiogram ?2D Echocardiogram has been performed. ? ?Emily Key ?11/20/2021, 2:54 PM ?

## 2021-11-20 NOTE — Progress Notes (Signed)
central monitoring  called noted with 7beats PVC(vtach ), patient sleeping when checked in room , denies any discomfrts , bp recheked 132/87 mmhg , HR 78  o2 sat 97% on RA. ?Dr [facility] cardiology o call paged . Awaiting call. ?

## 2021-11-20 NOTE — Progress Notes (Signed)
CSW acknowledges consult for SNF/HH. The patient will require PT/OT evaluations. TOC will assist with disposition planning once the evaluations have been completed.  °  °TOC will continue to follow.    °

## 2021-11-20 NOTE — Consult Note (Addendum)
Consultation  Referring Provider: [facility] Primary Care Physician:  Barbette Merino, NP Primary Gastroenterologist:  none/unassigned  Reason for Consultation: Chest pain, nausea vomiting   HPI: Emily Key is a 51 y.o. female, who was admitted yesterday after she presented to the emergency room with complaints of chest pain and shortness of breath.  She says that she had been having pain under the left breast and into the left back and up into the chest over the past 7 days.  She had had some intermittent nausea and vomiting associated with this and says that vomiting did seem to help her feel better briefly.  For the 2 nights prior to admission she says she was unable to lay down to sleep because of shortness of breath and discomfort. She is miserable this morning, feeling very nauseated but says she has not been able to vomit, she is not having any chest pain currently but still feels short of breath, denies any abdominal pain, has not had any diarrhea or melena.  No complaints of heartburn or indigestion. Patient is not on any blood thinners at home, no NSAID use, no EtOH.  She does have history of severe congestive heart failure with current EF of 20%, had hypertensive urgency on admission. CT angio of the chest abdomen and pelvis yesterday negative for any acute findings in the abdomen, or chest.  She did have a short burst of V. tach this morning 7 beats  Labs yesterday WBC 7.2/hemoglobin 13 hematocrit 40.6 BUN 17/creatinine 1.03   troponin 32/bland BNP 1619> 42 today  LFTs within normal limits/lipase normal  She says she has not had any similar episodes of this type of pain or vomiting in the past  Past Medical History:  Diagnosis Date   Cerumen impaction 01/2019   CHF (congestive heart failure) (HCC)    after last pregnancy   Daily headache    Heart murmur    "born w/one":   History of hiatal hernia    Hyperlipidemia 01/2019   Hypertension    Hypertensive  emergency 01/02/2019   Observed seizure-like activity (HCC) 01/02/2019   Pneumonia 11/2014   Seasonal allergies 01/2019   Vitamin D deficiency 01/2019    Past Surgical History:  Procedure Laterality Date   CARDIAC CATHETERIZATION N/A 02/13/2015   Procedure: Left Heart Cath and Coronary Angiography;  Surgeon: Lennette Bihari, MD;  Location: [facility];  Service: Cardiovascular;  Laterality: N/A;   PLEURAL EFFUSION DRAINAGE Left 11/14/2014   Procedure: DRAINAGE OF LEFT PLEURAL EFFUSION;  Surgeon: Loreli Slot, MD;  Location: [facility] OR;  Service: Thoracic;  Laterality: Left;   RIGHT/LEFT HEART CATH AND CORONARY ANGIOGRAPHY N/A 12/24/2020   Procedure: RIGHT/LEFT HEART CATH AND CORONARY ANGIOGRAPHY;  Surgeon: Dolores Patty, MD;  Location: [facility] CV [facility];  Service: Cardiovascular;  Laterality: N/A;   TONSILLECTOMY AND ADENOIDECTOMY  ~ 1986   TUBAL LIGATION  2000   VIDEO ASSISTED THORACOSCOPY (VATS)/DECORTICATION Left 11/14/2014   Procedure: LEFT VIDEO ASSISTED THORACOSCOPY (VATS)/DECORTICATION;  Surgeon: Loreli Slot, MD;  Location: [facility] OR;  Service: Thoracic;  Laterality: Left;    Prior to Admission medications   Medication Sig Start Date End Date Taking? Authorizing Provider  amLODipine (NORVASC) 5 MG tablet Take 1 tablet (5 mg total) by mouth daily. 03/02/21 03/02/22 Yes Milford, Anderson Malta, FNP  carvedilol (COREG) 25 MG tablet Take 1 tablet (25 mg total) by mouth 2 (two) times daily with a meal. 02/05/21  Yes Brooke Dare, [employer]  M, NP  citalopram (CELEXA) 20 MG tablet Take 1 tablet (20 mg total) by mouth daily. 02/05/21  Yes Barbette Merino, NP  dapagliflozin propanediol (FARXIGA) 10 MG TABS tablet Take 1 tablet (10 mg total) by mouth daily. 02/05/21  Yes Barbette Merino, NP  furosemide (LASIX) 20 MG tablet Take 2 tablets (40 mg total) by mouth as needed for fluid or edema. 02/05/21 02/05/22 Yes Barbette Merino, NP  hydrALAZINE (APRESOLINE) 100 MG tablet Take 1 tablet (100 mg total)  by mouth 3 (three) times daily. 02/17/21  Yes Bensimhon, Bevelyn Buckles, MD  isosorbide mononitrate (IMDUR) 30 MG 24 hr tablet Take 1 tablet (30 mg total) by mouth daily. 02/05/21  Yes King, Shana Chute, NP  sacubitril-valsartan (ENTRESTO) 97-103 MG Take 1 tablet by mouth 2 (two) times daily. 02/05/21  Yes Barbette Merino, NP  spironolactone (ALDACTONE) 25 MG tablet Take 1 tablet (25 mg total) by mouth daily. 02/05/21  Yes Barbette Merino, NP    Current Facility-Administered Medications  Medication Dose Route Frequency Provider Last Rate Last Admin   acetaminophen (TYLENOL) tablet 650 mg  650 mg Oral Q6H PRN Leroy Sea, MD   650 mg at 11/19/21 2020   Or   acetaminophen (TYLENOL) suppository 650 mg  650 mg Rectal Q6H PRN Leroy Sea, MD       albuterol (PROVENTIL) (2.5 MG/3ML) 0.083% nebulizer solution 2.5 mg  2.5 mg Nebulization Q2H PRN Leroy Sea, MD       alum & mag hydroxide-simeth (MAALOX/MYLANTA) 200-200-20 MG/5ML suspension 30 mL  30 mL Oral Q6H PRN Leroy Sea, MD   30 mL at 11/19/21 2149   amLODipine (NORVASC) tablet 10 mg  10 mg Oral Daily Leroy Sea, MD   10 mg at 11/20/21 0805   bisacodyl (DULCOLAX) EC tablet 5 mg  5 mg Oral Daily PRN Leroy Sea, MD       carvedilol (COREG) tablet 25 mg  25 mg Oral BID WC Leroy Sea, MD   25 mg at 11/20/21 0806   citalopram (CELEXA) tablet 20 mg  20 mg Oral Daily Leroy Sea, MD   20 mg at 11/20/21 0805   cloNIDine (CATAPRES) tablet 0.1 mg  0.1 mg Oral Q6H PRN Leroy Sea, MD   0.1 mg at 11/19/21 2020   dapagliflozin propanediol (FARXIGA) tablet 10 mg  10 mg Oral Daily Leroy Sea, MD   10 mg at 11/20/21 0805   enoxaparin (LOVENOX) injection 40 mg  40 mg Subcutaneous Q24H Leroy Sea, MD   40 mg at 11/19/21 2027   feeding supplement (ENSURE ENLIVE / ENSURE PLUS) liquid 237 mL  237 mL Oral BID BM Leroy Sea, MD       hydrALAZINE (APRESOLINE) injection 10 mg  10 mg Intravenous Q4H PRN  Leroy Sea, MD       hydrALAZINE (APRESOLINE) tablet 100 mg  100 mg Oral TID Leroy Sea, MD   100 mg at 11/19/21 1703   isosorbide mononitrate (IMDUR) 24 hr tablet 60 mg  60 mg Oral Daily Leroy Sea, MD   60 mg at 11/20/21 0814   ondansetron (ZOFRAN) tablet 4 mg  4 mg Oral Q6H PRN Leroy Sea, MD       Or   ondansetron [facility]) injection 4 mg  4 mg Intravenous Q6H PRN Leroy Sea, MD   4 mg at 11/19/21 1705   pantoprazole (PROTONIX) EC  tablet 40 mg  40 mg Oral BID AC Leroy Sea, MD   40 mg at 11/20/21 7829   sacubitril-valsartan (ENTRESTO) 97-103 mg per tablet  1 tablet Oral BID Leroy Sea, MD   1 tablet at 11/20/21 0805   spironolactone (ALDACTONE) tablet 25 mg  25 mg Oral Daily Bhagat, Bhavinkumar, PA   25 mg at 11/20/21 0806    Allergies as of 11/19/2021   (No Known Allergies)    Family History  Problem Relation Age of Onset   Diabetes Mellitus II Mother    Hypertension Mother    Lung cancer Father     Social History   Socioeconomic History   Marital status: Divorced    Spouse name: Not on file   Number of children: Not on file   Years of education: Not on file   Highest education level: Not on file  Occupational History   Not on file  Tobacco Use   Smoking status: Former    Packs/day: 0.33    Years: 10.00    Pack years: 3.30    Types: Cigarettes    Quit date: 11/11/2014    Years since quitting: 7.0   Smokeless tobacco: Never  Substance and Sexual Activity   Alcohol use: Yes    Alcohol/week: 0.0 standard drinks    Comment: 8/1/20176 "a couple beers a couple times/month"   Drug use: No   Sexual activity: Yes  Other Topics Concern   Not on file  Social History Narrative   Not on file   Social Determinants of Health   Financial Resource Strain: High Risk   Difficulty of Paying Living Expenses: Hard  Food Insecurity: Food Insecurity Present   Worried About Programme researcher, broadcasting/film/video in the Last Year: Sometimes true   Ran  Out of Food in the Last Year: Sometimes true  Transportation Needs: Unmet Transportation Needs   Lack of Transportation (Medical): Yes   Lack of Transportation (Non-Medical): Yes  Physical Activity: Not on file  Stress: Not on file  Social Connections: Not on file  Intimate Partner Violence: Not on file    Review of Systems: Pertinent positive and negative review of systems were noted in the above HPI section.  All other review of systems was otherwise negative.  Physical Exam: Vital signs in last 24 hours: Temp:  [96.5 F (35.8 C)-98.1 F (36.7 C)] 97.5 F (36.4 C) (05/13 0349) Pulse Rate:  [60-105] 73 (05/13 0349) Resp:  [10-33] 18 (05/13 0846) BP: (114-198)/(80-132) 114/89 (05/13 0814) SpO2:  [92 %-100 %] 92 % (05/13 0349) Weight:  [75 kg-81.6 kg] 75.2 kg (05/13 0349) Last BM Date : 11/19/21 General:   Alert,  Well-developed, well-nourished, older African-American female pleasant and cooperative , very nauseated, had episode of diaphoresis Head:  Normocephalic and atraumatic. Eyes:  Sclera clear, no icterus.   Conjunctiva pink. Ears:  Normal auditory acuity. Nose:  No deformity, discharge,  or lesions. Mouth:  No deformity or lesions.   Neck:  Supple; no masses or thyromegaly. Lungs:  Clear throughout to auscultation.   No wheezes, crackles, or rhonchi.  She is tender to palpation of the left lower ribs and costal margin around into the left back Heart:  Regular rate and rhythm; no murmurs, clicks, rubs,  or gallops. Abdomen:  Soft,nontender, BS active,nonpalp mass or hsm.   Rectal: Not done Msk:  Symmetrical without gross deformities. . Pulses:  Normal pulses noted. Extremities:  Without clubbing or edema. Neurologic:  Alert and  oriented x4;  grossly normal neurologically. Skin:  Intact without significant lesions or rashes.. Psych:  Alert and cooperative. Normal mood and affect.  Intake/Output from previous day: 05/12 0701 - 05/13 0700 In: 60 [P.O.:60] Out: 400  [Urine:400] Intake/Output this shift: No intake/output data recorded.  Lab Results: Recent Labs    11/19/21 1100 11/20/21 0341  WBC 7.2 9.1  HGB 13.2 16.4*  HCT 40.6 49.1*  PLT 282 324   BMET Recent Labs    11/19/21 1100 11/20/21 0914  NA 141 134*  K 3.6 3.6  CL 107 96*  CO2 23 23  GLUCOSE 140* 201*  BUN 17 31*  CREATININE 1.03* 1.67*  CALCIUM 9.3 9.3   LFT Recent Labs    11/19/21 1219 11/20/21 0914  PROT 7.1 7.7  ALBUMIN 3.6 3.9  AST 22 19  ALT 21 20  ALKPHOS 62 68  BILITOT 0.9 1.2  BILIDIR 0.3*  --   IBILI 0.6  --    PT/INR No results for input(s): LABPROT, INR in the last 72 hours. Hepatitis Panel No results for input(s): HEPBSAG, HCVAB, HEPAIGM, HEPBIGM in the last 72 hours.    IMPRESSION:  #36 51 year old African-American female with 1 week history of left lower chest and back pain associated with intermittent nausea and vomiting as well as increased dyspnea.  Patient has significant congestive heart failure with EF 20 to 25%  Hypertensive urgency on admission.  On exam she has chest wall tenderness along the left lower ribs and costal margin, Primary complaint currently is severe nausea, she does not have abdominal tenderness to my exam.  Etiology of current symptoms not entirely clear, suspect viral syndrome superimposed on her severe chronic congestive heart failure   #2 acute kidney injury #3 nonsustained V. tach  PLAN: Clear liquid diet Add IV Protonix once daily Add Zofran 4 mg every 6 hours around-the-clock short-term control nausea GI will follow along, not clear at present that she needs EGD.   Amy Esterwood PA-C 11/20/2021, 10:06 AM  I have taken an interval history, thoroughly reviewed the chart and examined the patient. I agree with the Advanced Practitioner's note, impression and recommendations, and have recorded additional findings, impressions and recommendations below. I performed a substantive portion of this encounter  (>50% time spent), including a complete performance of the medical decision making.  My additional thoughts are as follows:  About a week of symptoms that have been evolving from chest pain and dyspnea and fatigue to more epigastric to left upper quadrant/left flank pain with nausea and vomiting. No bowel obstruction, apparent inflammation on CT angiogram, LFTs normal, normal white count, no overt GI bleeding.  She reports a single stool earlier today that was soft but "not diarrhea". Earlier today she was quite uncomfortable vomiting.  Elevated BNP that is now improved, echocardiogram report from today still pending.  Overall, it is not entirely clear what is causing this clinical picture.  It sounds like it began rather acutely about a week ago, she has tenderness along the lower costal margin and epigastric to left upper quadrant abdominal wall.  Still possibly some cardiac or CHF contributing factors.  This may be a protracted course of a viral upper digestive illness.  No current plans for upper endoscopy as I think that would likely be low yield, and it is an increased risk procedure given the severity of her CHF and some ventricular ectopy.   I would manage her conservatively with judicious use of pain medicine as well as  antiemetics and observation.  We will follow.  Charlie Pitter III [phone]

## 2021-11-21 DIAGNOSIS — I3139 Other pericardial effusion (noninflammatory): Secondary | ICD-10-CM | POA: Diagnosis present

## 2021-11-21 DIAGNOSIS — Z87891 Personal history of nicotine dependence: Secondary | ICD-10-CM | POA: Diagnosis not present

## 2021-11-21 DIAGNOSIS — E559 Vitamin D deficiency, unspecified: Secondary | ICD-10-CM | POA: Diagnosis present

## 2021-11-21 DIAGNOSIS — R112 Nausea with vomiting, unspecified: Secondary | ICD-10-CM | POA: Diagnosis not present

## 2021-11-21 DIAGNOSIS — I160 Hypertensive urgency: Secondary | ICD-10-CM | POA: Diagnosis not present

## 2021-11-21 DIAGNOSIS — I272 Pulmonary hypertension, unspecified: Secondary | ICD-10-CM | POA: Diagnosis present

## 2021-11-21 DIAGNOSIS — F32A Depression, unspecified: Secondary | ICD-10-CM | POA: Diagnosis present

## 2021-11-21 DIAGNOSIS — I5042 Chronic combined systolic (congestive) and diastolic (congestive) heart failure: Secondary | ICD-10-CM | POA: Diagnosis not present

## 2021-11-21 DIAGNOSIS — I161 Hypertensive emergency: Secondary | ICD-10-CM | POA: Diagnosis present

## 2021-11-21 DIAGNOSIS — I252 Old myocardial infarction: Secondary | ICD-10-CM | POA: Diagnosis not present

## 2021-11-21 DIAGNOSIS — I2510 Atherosclerotic heart disease of native coronary artery without angina pectoris: Secondary | ICD-10-CM | POA: Diagnosis present

## 2021-11-21 DIAGNOSIS — N179 Acute kidney failure, unspecified: Secondary | ICD-10-CM | POA: Diagnosis present

## 2021-11-21 DIAGNOSIS — E785 Hyperlipidemia, unspecified: Secondary | ICD-10-CM | POA: Diagnosis present

## 2021-11-21 DIAGNOSIS — R0789 Other chest pain: Secondary | ICD-10-CM | POA: Diagnosis present

## 2021-11-21 DIAGNOSIS — Z8249 Family history of ischemic heart disease and other diseases of the circulatory system: Secondary | ICD-10-CM | POA: Diagnosis not present

## 2021-11-21 DIAGNOSIS — I10 Essential (primary) hypertension: Secondary | ICD-10-CM | POA: Diagnosis not present

## 2021-11-21 DIAGNOSIS — I248 Other forms of acute ischemic heart disease: Secondary | ICD-10-CM | POA: Diagnosis present

## 2021-11-21 DIAGNOSIS — Z79899 Other long term (current) drug therapy: Secondary | ICD-10-CM | POA: Diagnosis not present

## 2021-11-21 DIAGNOSIS — Z833 Family history of diabetes mellitus: Secondary | ICD-10-CM | POA: Diagnosis not present

## 2021-11-21 DIAGNOSIS — Z91148 Patient's other noncompliance with medication regimen for other reason: Secondary | ICD-10-CM | POA: Diagnosis not present

## 2021-11-21 DIAGNOSIS — I110 Hypertensive heart disease with heart failure: Secondary | ICD-10-CM | POA: Diagnosis present

## 2021-11-21 DIAGNOSIS — Z801 Family history of malignant neoplasm of trachea, bronchus and lung: Secondary | ICD-10-CM | POA: Diagnosis not present

## 2021-11-21 DIAGNOSIS — I5043 Acute on chronic combined systolic (congestive) and diastolic (congestive) heart failure: Secondary | ICD-10-CM | POA: Diagnosis present

## 2021-11-21 DIAGNOSIS — I428 Other cardiomyopathies: Secondary | ICD-10-CM | POA: Diagnosis present

## 2021-11-21 DIAGNOSIS — R1013 Epigastric pain: Secondary | ICD-10-CM | POA: Diagnosis not present

## 2021-11-21 DIAGNOSIS — F419 Anxiety disorder, unspecified: Secondary | ICD-10-CM | POA: Diagnosis present

## 2021-11-21 DIAGNOSIS — I472 Ventricular tachycardia, unspecified: Secondary | ICD-10-CM | POA: Diagnosis not present

## 2021-11-21 LAB — CBC WITH DIFFERENTIAL/PLATELET
Abs Immature Granulocytes: 0.05 10*3/uL (ref 0.00–0.07)
Basophils Absolute: 0 10*3/uL (ref 0.0–0.1)
Basophils Relative: 0 %
Eosinophils Absolute: 0 10*3/uL (ref 0.0–0.5)
Eosinophils Relative: 0 %
HCT: 48.5 % — ABNORMAL HIGH (ref 36.0–46.0)
Hemoglobin: 16.5 g/dL — ABNORMAL HIGH (ref 12.0–15.0)
Immature Granulocytes: 0 %
Lymphocytes Relative: 11 %
Lymphs Abs: 1.8 10*3/uL (ref 0.7–4.0)
MCH: 27.2 pg (ref 26.0–34.0)
MCHC: 34 g/dL (ref 30.0–36.0)
MCV: 80 fL (ref 80.0–100.0)
Monocytes Absolute: 0.7 10*3/uL (ref 0.1–1.0)
Monocytes Relative: 4 %
Neutro Abs: 13.4 10*3/uL — ABNORMAL HIGH (ref 1.7–7.7)
Neutrophils Relative %: 85 %
Platelets: 338 10*3/uL (ref 150–400)
RBC: 6.06 MIL/uL — ABNORMAL HIGH (ref 3.87–5.11)
RDW: 14.9 % (ref 11.5–15.5)
WBC: 15.9 10*3/uL — ABNORMAL HIGH (ref 4.0–10.5)
nRBC: 0 % (ref 0.0–0.2)

## 2021-11-21 LAB — COMPREHENSIVE METABOLIC PANEL
ALT: 16 U/L (ref 0–44)
AST: 16 U/L (ref 15–41)
Albumin: 3.6 g/dL (ref 3.5–5.0)
Alkaline Phosphatase: 59 U/L (ref 38–126)
Anion gap: 13 (ref 5–15)
BUN: 47 mg/dL — ABNORMAL HIGH (ref 6–20)
CO2: 25 mmol/L (ref 22–32)
Calcium: 9.1 mg/dL (ref 8.9–10.3)
Chloride: 96 mmol/L — ABNORMAL LOW (ref 98–111)
Creatinine, Ser: 1.89 mg/dL — ABNORMAL HIGH (ref 0.44–1.00)
GFR, Estimated: 32 mL/min — ABNORMAL LOW (ref >60)
Glucose, Bld: 144 mg/dL — ABNORMAL HIGH (ref 70–99)
Potassium: 3.9 mmol/L (ref 3.5–5.1)
Sodium: 134 mmol/L — ABNORMAL LOW (ref 135–145)
Total Bilirubin: 0.9 mg/dL (ref 0.3–1.2)
Total Protein: 7.1 g/dL (ref 6.5–8.1)

## 2021-11-21 LAB — BRAIN NATRIURETIC PEPTIDE: B Natriuretic Peptide: 99.4 pg/mL (ref 0.0–100.0)

## 2021-11-21 LAB — MAGNESIUM: Magnesium: 2.8 mg/dL — ABNORMAL HIGH (ref 1.7–2.4)

## 2021-11-21 MED ORDER — AMLODIPINE BESYLATE 10 MG PO TABS: 10.0000 mg | ORAL_TABLET | Freq: Every day | ORAL | Status: DC

## 2021-11-21 MED ORDER — ISOSORBIDE MONONITRATE ER 30 MG PO TB24
30.0000 mg | ORAL_TABLET | Freq: Every day | ORAL | Status: DC
  Administered 2021-11-22: 30 mg via ORAL
  Filled 2021-11-21: qty 1

## 2021-11-21 MED ORDER — HYDRALAZINE HCL 50 MG PO TABS
50.0000 mg | ORAL_TABLET | Freq: Three times a day (TID) | ORAL | Status: DC
  Administered 2021-11-21 – 2021-11-22 (×2): 50 mg via ORAL
  Filled 2021-11-21 (×2): qty 1

## 2021-11-21 MED ORDER — LACTATED RINGERS IV SOLN: INTRAVENOUS | Status: AC

## 2021-11-21 NOTE — Progress Notes (Addendum)
Patient ID: Emily Key, female   DOB: Sep 05, 1970, 51 y.o.   MRN: [id number]    Progress Note   Subjective   Day # 3  CC; chest pain, epigastric pain, nausea ,vomiting, CHF  Patient was sleeping, says she feels better than yesterday, not currently nauseated and has not had vomiting , does not have an appetite and does not want to eat anything. No complaints of chest pain, still some soreness in the upper abdomen Chest wall discomfort  BNP normalized to 99-was 1619 2 days ago  WBC 15.9/hemoglobin 16.5/hematocrit 48.5 BUN 47/creatinine 1.8  NSVT yesterday-no recurrence today    Objective   Vital signs in last 24 hours: Temp:  [98 F (36.7 C)-98.3 F (36.8 C)] 98.3 F (36.8 C) (05/14 0420) Pulse Rate:  [69-72] 69 (05/14 0420) Resp:  [16] 16 (05/14 0420) BP: (101-135)/(72-87) 135/87 (05/14 0420) SpO2:  [95 %-99 %] 95 % (05/14 0420) Weight:  [76.9 kg] 76.9 kg (05/14 0500) Last BM Date : 11/19/21 General:    Older African-American female in NAD Heart:  Regular rate and rhythm; no murmurs Lungs: Respirations even and unlabored, lungs CTA bilaterally Abdomen:  Soft, some mild tenderness in the epigastrium towards the left upper quadrant no guarding or rebound nondistended. Normal bowel sounds. Extremities:  Without edema. Neurologic:  Alert and oriented,  grossly normal neurologically. Psych:  Cooperative. Normal mood and affect.  Intake/Output from previous day: 05/13 0701 - 05/14 0700 In: 250.4 [IV Piggyback:250.4] Out: -  Intake/Output this shift: No intake/output data recorded.  Lab Results: Recent Labs    11/19/21 1100 11/20/21 0341 11/21/21 0251  WBC 7.2 9.1 15.9*  HGB 13.2 16.4* 16.5*  HCT 40.6 49.1* 48.5*  PLT 282 324 338   BMET Recent Labs    11/19/21 1100 11/20/21 0914 11/21/21 0251  NA 141 134* 134*  K 3.6 3.6 3.9  CL 107 96* 96*  CO2 23 23 25   GLUCOSE 140* 201* 144*  BUN 17 31* 47*  CREATININE 1.03* 1.67* 1.89*  CALCIUM 9.3 9.3 9.1    LFT Recent Labs    11/19/21 1219 11/20/21 0914 11/21/21 0251  PROT 7.1   < > 7.1  ALBUMIN 3.6   < > 3.6  AST 22   < > 16  ALT 21   < > 16  ALKPHOS 62   < > 59  BILITOT 0.9   < > 0.9  BILIDIR 0.3*  --   --   IBILI 0.6  --   --    < > = values in this interval not displayed.   PT/INR No results for input(s): LABPROT, INR in the last 72 hours.  Studies/Results: DG Abd Portable 1V  Result Date: 11/20/2021 CLINICAL DATA:  Nausea and vomiting EXAM: PORTABLE ABDOMEN - 1 VIEW COMPARISON:  01/02/2019 FINDINGS: The bowel gas pattern is normal. Multiple bilateral renal calculi. Tubal ligation clips. Clear lung bases. IMPRESSION: 1. Normal bowel gas pattern. 2. Bilateral nephrolithiasis. Electronically Signed   By: Tiburcio Pea M.D.   On: 11/20/2021 08:11   ECHOCARDIOGRAM COMPLETE  Result Date: 11/20/2021    ECHOCARDIOGRAM REPORT   Patient Name:   Emily Key Date of Exam: 11/20/2021 Medical Rec #:  [id number]     Height:       67.0 in Accession #:    [id number]    Weight:       165.7 lb Date of Birth:  [birth year]     BSA:  1.867 m Patient Age:    50 years      BP:           114/85 mmHg Patient Gender: F             HR:           73 bpm. Exam Location:  Inpatient Procedure: 2D Echo, Cardiac Doppler and Color Doppler Indications:    CHF-acute diastolic  History:        Patient has prior history of Echocardiogram examinations, most                 recent 12/22/2020. Risk Factors:Dyslipidemia and Hypertension.  Sonographer:    Ross Ludwig RDCS (AE) Referring Phys: [id number] [facility]  Sonographer Comments: Patient moving constantly. IMPRESSIONS  1. Left ventricular ejection fraction, by estimation, is 20 to 25%. The left ventricle has severely decreased function. The left ventricle demonstrates global hypokinesis. There is mild left ventricular hypertrophy. Left ventricular diastolic parameters  are indeterminate.  2. Right ventricular systolic function is normal. The right ventricular size  is normal.  3. The pericardial effusion is circumferential.  4. The mitral valve is abnormal. Mild mitral valve regurgitation. No evidence of mitral stenosis.  5. The aortic valve is tricuspid. Aortic valve regurgitation is not visualized. No aortic stenosis is present.  6. IVC is small suggesting low RA pressure and hypovolemia. FINDINGS  Left Ventricle: Left ventricular ejection fraction, by estimation, is 20 to 25%. The left ventricle has severely decreased function. The left ventricle demonstrates global hypokinesis. The left ventricular internal cavity size was normal in size. There is mild left ventricular hypertrophy. Left ventricular diastolic parameters are indeterminate. Right Ventricle: The right ventricular size is normal. No increase in right ventricular wall thickness. Right ventricular systolic function is normal. Left Atrium: Left atrial size was normal in size. Right Atrium: Right atrial size was normal in size. Pericardium: Trivial pericardial effusion is present. The pericardial effusion is circumferential. Mitral Valve: The mitral valve is abnormal. There is mild thickening of the mitral valve leaflet(s). There is mild calcification of the mitral valve leaflet(s). Mild mitral annular calcification. Mild mitral valve regurgitation. No evidence of mitral valve stenosis. MV peak gradient, 2.3 mmHg. The mean mitral valve gradient is 1.0 mmHg. Tricuspid Valve: The tricuspid valve is normal in structure. Tricuspid valve regurgitation is trivial. No evidence of tricuspid stenosis. Aortic Valve: The aortic valve is tricuspid. Aortic valve regurgitation is not visualized. No aortic stenosis is present. Aortic valve mean gradient measures 1.0 mmHg. Aortic valve peak gradient measures 2.8 mmHg. Aortic valve area, by VTI measures 1.81 cm. Pulmonic Valve: The pulmonic valve was not well visualized. Pulmonic valve regurgitation is mild. No evidence of pulmonic stenosis. Aorta: The aortic root is normal in  size and structure. Venous: IVC is small suggesting low RA pressure and hypovolemia. IAS/Shunts: No atrial level shunt detected by color flow Doppler.  LEFT VENTRICLE PLAX 2D LVIDd:         5.90 cm LVIDs:         5.20 cm LV PW:         1.20 cm LV IVS:        1.10 cm LVOT diam:     2.00 cm LV SV:         22 LV SV Index:   12 LVOT Area:     3.14 cm  LV Volumes (MOD) LV vol d, MOD A2C: 84.6 ml LV vol d, MOD A4C: 104.0 ml  LV vol s, MOD A2C: 73.8 ml LV vol s, MOD A4C: 80.4 ml LV SV MOD A2C:     10.8 ml LV SV MOD A4C:     104.0 ml LV SV MOD BP:      19.2 ml RIGHT VENTRICLE            IVC RV Basal diam:  2.40 cm    IVC diam: 0.70 cm RV S prime:     6.09 cm/s TAPSE (M-mode): 0.7 cm LEFT ATRIUM             Index        RIGHT ATRIUM           Index LA diam:        3.10 cm 1.66 cm/m   RA Area:     12.80 cm LA Vol (A2C):   31.7 ml 16.98 ml/m  RA Volume:   29.40 ml  15.75 ml/m LA Vol (A4C):   40.1 ml 21.48 ml/m LA Biplane Vol: 35.7 ml 19.12 ml/m  AORTIC VALVE AV Area (Vmax):    1.79 cm AV Area (Vmean):   1.65 cm AV Area (VTI):     1.81 cm AV Vmax:           84.10 cm/s AV Vmean:          55.300 cm/s AV VTI:            0.120 m AV Peak Grad:      2.8 mmHg AV Mean Grad:      1.0 mmHg LVOT Vmax:         48.00 cm/s LVOT Vmean:        29.100 cm/s LVOT VTI:          0.069 m LVOT/AV VTI ratio: 0.58  AORTA Ao Root diam: 3.10 cm Ao Asc diam:  3.00 cm MITRAL VALVE MV Area VTI:  1.46 cm   SHUNTS MV Peak grad: 2.3 mmHg   Systemic VTI:  0.07 m MV Mean grad: 1.0 mmHg   Systemic Diam: 2.00 cm MV Vmax:      0.75 m/s MV Vmean:     36.1 cm/s Dina Rich MD Electronically signed by Dina Rich MD Signature Date/Time: 11/20/2021/3:02:06 PM    Final        Assessment / Plan:    #31 51 year old female with congestive heart failure and EF 20 to 24% admitted with 1 week history of left lower chest wall and back pain associated with intermittent nausea vomiting and increased dyspnea  Hypertensive urgency on admission  improved Significant elevation in BNP now normalized  #2 acute kidney injury secondary to above #3 nausea /vomiting /epigastric discomfort-patient improved today, was miserable yesterday, has not had any vomiting today and nausea is much improved she continues to have some mild epigastric discomfort CT angio on admission of the chest abdomen pelvis no acute abnormality in the abdomen, normal-appearing gallbladder  Rule out viral syndrome, possible gastritis-her GI symptoms definitely coincided with worsening of her congestive heart failure  #4 nonsustained V. Tach-yesterday-no recurrence  Plan; advance to full liquid diet as tolerated Continue IV Protonix till taking p.o.'s well then convert to p.o. Continue Zofran around-the-clock another 24 hours GI will see again tomorrow, as long as she continues to improve would not plan for EGD as unlikely to change management   Principal Problem:   Hypertensive urgency Active Problems:   Hypertension   Acute on chronic systolic CHF (congestive heart failure) (HCC)   CHF (  congestive heart failure) (HCC)     LOS: 0 days   Amy Esterwood PA-C 11/21/2021, 1:50 PM  I have taken an interval history, thoroughly reviewed the chart and examined the patient. I agree with the Advanced Practitioner's note, impression and recommendations, and have recorded additional findings, impressions and recommendations below. I performed a substantive portion of this encounter (>50% time spent), including a complete performance of the medical decision making.  My additional thoughts are as follows:  Recent onset upper abdominal/chest pain nausea and vomiting in the setting of hypertensive urgency that is now better controlled. GI symptoms are responding to conservative management.  No current plans for endoscopy. Our service will see her again tomorrow, call sooner if urgent issues arise.  (Dr. Barron Alvine will manage the consult service starting  tomorrow)   Charlie Pitter III Office:[phone]

## 2021-11-21 NOTE — Progress Notes (Signed)
? ?Progress Note ? ?Patient Name: Emily Key ?Date of Encounter: 11/21/2021 ? ?[facility] Cardiologist: Bensimhon ? ?Subjective  ? ?Some nauesea this AM. No SOB/DOE ? ?Inpatient Medications  ?  ?Scheduled Meds: ? [START ON 11/22/2021] amLODipine  10 mg Oral Daily  ? carvedilol  25 mg Oral BID WC  ? citalopram  20 mg Oral Daily  ? dapagliflozin propanediol  10 mg Oral Daily  ? enoxaparin (LOVENOX) injection  40 mg Subcutaneous Q24H  ? feeding supplement  237 mL Oral BID BM  ? hydrALAZINE  50 mg Oral TID  ? [START ON 11/22/2021] isosorbide mononitrate  30 mg Oral Daily  ? ondansetron  4 mg Oral Q6H  ? Or  ? ondansetron (ZOFRAN) IV  4 mg Intravenous Q6H  ? pantoprazole (PROTONIX) IV  40 mg Intravenous Q24H  ? ?Continuous Infusions: ? lactated ringers 100 mL/hr at 11/21/21 7902  ? promethazine (PHENERGAN) injection (IM or IVPB) Stopped (11/20/21 1448)  ? ?PRN Meds: ?acetaminophen **OR** acetaminophen, albuterol, alum & mag hydroxide-simeth, bisacodyl, cloNIDine, hydrALAZINE, promethazine (PHENERGAN) injection (IM or IVPB)  ? ?Vital Signs  ?  ?Vitals:  ? 11/20/21 1728 11/20/21 1938 11/21/21 0420 11/21/21 0500  ?BP: 101/72 109/73 135/87   ?Pulse:  72 69   ?Resp:  16 16   ?Temp:  98 ?F (36.7 ?C) 98.3 ?F (36.8 ?C)   ?TempSrc:  Oral Oral   ?SpO2:  99% 95%   ?Weight:    76.9 kg  ?Height:      ? ? ?Intake/Output Summary (Last 24 hours) at 11/21/2021 1022 ?Last data filed at 11/20/2021 1500 ?Gross per 24 hour  ?Intake 250.36 ml  ?Output --  ?Net 250.36 ml  ? ? ?  11/21/2021  ?  5:00 AM 11/20/2021  ?  3:49 AM 11/19/2021  ?  7:49 PM  ?Last 3 Weights  ?Weight (lbs) 169 lb 8.5 oz 165 lb 11.2 oz 165 lb 5.5 oz  ?Weight (kg) 76.9 kg 75.161 kg 75 kg  ?   ? ?Telemetry  ?  ?SR - Personally Reviewed ? ?ECG  ?  ?N/a - Personally Reviewed ? ?Physical Exam  ? ?GEN: No acute distress.   ?Neck: No JVD ?Cardiac: RRR, no murmurs, rubs, or gallops.  ?Respiratory: Clear to auscultation bilaterally. ?GI: Soft, nontender, non-distended  ?MS: No  edema; No deformity. ?Neuro:  Nonfocal  ?Psych: Normal affect  ? ?Labs  ?  ?High Sensitivity Troponin:   ?Recent Labs  ?Lab 11/19/21 ?1100 11/19/21 ?1520  ?TROPONINIHS 32* 21*  ?   ?Chemistry ?Recent Labs  ?Lab 11/19/21 ?1100 11/19/21 ?1219 11/20/21 ?4097 11/21/21 ?0251  ?NA 141  --  134* 134*  ?K 3.6  --  3.6 3.9  ?CL 107  --  96* 96*  ?CO2 23  --  23 25  ?GLUCOSE 140*  --  201* 144*  ?BUN 17  --  31* 47*  ?CREATININE 1.03*  --  1.67* 1.89*  ?CALCIUM 9.3  --  9.3 9.1  ?MG  --   --  2.7* 2.8*  ?PROT  --  7.1 7.7 7.1  ?ALBUMIN  --  3.6 3.9 3.6  ?AST  --  '22 19 16  '$ ?ALT  --  '21 20 16  '$ ?ALKPHOS  --  62 68 59  ?BILITOT  --  0.9 1.2 0.9  ?GFRNONAA >60  --  37* 32*  ?ANIONGAP 11  --  15 13  ?  ?Lipids No results for input(s): CHOL, TRIG, HDL, LABVLDL, LDLCALC,  CHOLHDL in the last 168 hours.  ?Hematology ?Recent Labs  ?Lab 11/19/21 ?1100 11/20/21 ?7824 11/21/21 ?0251  ?WBC 7.2 9.1 15.9*  ?RBC 4.99 6.19* 6.06*  ?HGB 13.2 16.4* 16.5*  ?HCT 40.6 49.1* 48.5*  ?MCV 81.4 79.3* 80.0  ?MCH 26.5 26.5 27.2  ?MCHC 32.5 33.4 34.0  ?RDW 14.6 14.7 14.9  ?PLT 282 324 338  ? ?Thyroid  ?Recent Labs  ?Lab 11/19/21 ?1219  ?TSH 0.540  ?  ?BNP ?Recent Labs  ?Lab 11/19/21 ?1219 11/20/21 ?2353 11/21/21 ?0251  ?BNP 1,619.6* 482.7* 99.4  ?  ?DDimer No results for input(s): DDIMER in the last 168 hours.  ? ?Radiology  ?  ?DG Chest 2 View ? ?Result Date: 11/19/2021 ?CLINICAL DATA:  Central chest pain 1 week. Shortness of breath with nausea and vomiting. EXAM: CHEST - 2 VIEW COMPARISON:  12/21/2020 FINDINGS: Lungs are adequately inflated without focal airspace consolidation or effusion. Minimal stable blunting of the left costophrenic angle. Cardiomediastinal silhouette and remainder of the exam is unchanged. IMPRESSION: No acute cardiopulmonary disease. Electronically Signed   By: Marin Olp M.D.   On: 11/19/2021 11:29  ? ?DG Abd Portable 1V ? ?Result Date: 11/20/2021 ?CLINICAL DATA:  Nausea and vomiting EXAM: PORTABLE ABDOMEN - 1 VIEW COMPARISON:   01/02/2019 FINDINGS: The bowel gas pattern is normal. Multiple bilateral renal calculi. Tubal ligation clips. Clear lung bases. IMPRESSION: 1. Normal bowel gas pattern. 2. Bilateral nephrolithiasis. Electronically Signed   By: Jorje Guild M.D.   On: 11/20/2021 08:11  ? ?ECHOCARDIOGRAM COMPLETE ? ?Result Date: 11/20/2021 ?   ECHOCARDIOGRAM REPORT   Patient Name:   Emily Key Date of Exam: 11/20/2021 Medical Rec #:  [id number]     Height:       67.0 in Accession #:    [id number]    Weight:       165.7 lb Date of Birth:  11-07-1970     BSA:          1.867 m? Patient Age:    51 years      BP:           114/85 mmHg Patient Gender: F             HR:           73 bpm. Exam Location:  Inpatient Procedure: 2D Echo, Cardiac Doppler and Color Doppler Indications:    CHF-acute diastolic  History:        Patient has prior history of Echocardiogram examinations, most                 recent 12/22/2020. Risk Factors:Dyslipidemia and Hypertension.  Sonographer:    Clayton Lefort RDCS (AE) Referring Phys: [id number] [address] [facility]  Sonographer Comments: Patient moving constantly. IMPRESSIONS  1. Left ventricular ejection fraction, by estimation, is 20 to 25%. The left ventricle has severely decreased function. The left ventricle demonstrates global hypokinesis. There is mild left ventricular hypertrophy. Left ventricular diastolic parameters  are indeterminate.  2. Right ventricular systolic function is normal. The right ventricular size is normal.  3. The pericardial effusion is circumferential.  4. The mitral valve is abnormal. Mild mitral valve regurgitation. No evidence of mitral stenosis.  5. The aortic valve is tricuspid. Aortic valve regurgitation is not visualized. No aortic stenosis is present.  6. IVC is small suggesting low RA pressure and hypovolemia. FINDINGS  Left Ventricle: Left ventricular ejection fraction, by estimation, is 20 to 25%. The left ventricle has severely decreased function. The left  ventricle demonstrates  global hypokinesis. The left ventricular internal cavity size was normal in size. There is mild left ventricular hypertrophy. Left ventricular diastolic parameters are indeterminate. Right Ventricle: The right ventricular size is normal. No increase in right ventricular wall thickness. Right ventricular systolic function is normal. Left Atrium: Left atrial size was normal in size. Right Atrium: Right atrial size was normal in size. Pericardium: Trivial pericardial effusion is present. The pericardial effusion is circumferential. Mitral Valve: The mitral valve is abnormal. There is mild thickening of the mitral valve leaflet(s). There is mild calcification of the mitral valve leaflet(s). Mild mitral annular calcification. Mild mitral valve regurgitation. No evidence of mitral valve stenosis. MV peak gradient, 2.3 mmHg. The mean mitral valve gradient is 1.0 mmHg. Tricuspid Valve: The tricuspid valve is normal in structure. Tricuspid valve regurgitation is trivial. No evidence of tricuspid stenosis. Aortic Valve: The aortic valve is tricuspid. Aortic valve regurgitation is not visualized. No aortic stenosis is present. Aortic valve mean gradient measures 1.0 mmHg. Aortic valve peak gradient measures 2.8 mmHg. Aortic valve area, by VTI measures 1.81 cm?. Pulmonic Valve: The pulmonic valve was not well visualized. Pulmonic valve regurgitation is mild. No evidence of pulmonic stenosis. Aorta: The aortic root is normal in size and structure. Venous: IVC is small suggesting low RA pressure and hypovolemia. IAS/Shunts: No atrial level shunt detected by color flow Doppler.  LEFT VENTRICLE PLAX 2D LVIDd:         5.90 cm LVIDs:         5.20 cm LV PW:         1.20 cm LV IVS:        1.10 cm LVOT diam:     2.00 cm LV SV:         22 LV SV Index:   12 LVOT Area:     3.14 cm?  LV Volumes (MOD) LV vol d, MOD A2C: 84.6 ml LV vol d, MOD A4C: 104.0 ml LV vol s, MOD A2C: 73.8 ml LV vol s, MOD A4C: 80.4 ml LV SV MOD A2C:     10.8 ml LV SV  MOD A4C:     104.0 ml LV SV MOD BP:      19.2 ml RIGHT VENTRICLE            IVC RV Basal diam:  2.40 cm    IVC diam: 0.70 cm RV S prime:     6.09 cm/s TAPSE (M-mode): 0.7 cm LEFT ATRIUM             Index

## 2021-11-21 NOTE — Progress Notes (Signed)
?                                  PROGRESS NOTE                                             ?                                                                                                                     ?                                         ? ? Patient Demographics:  ? ? Emily Key, is a 51 y.o. female, DOB - May 24, 1971, [id number] ? ?Outpatient Primary MD for the patient is Vevelyn Francois, NP    LOS - 0  Admit date - 11/19/2021   ? ?Chief Complaint  ?Patient presents with  ? Chest Pain  ?    ? ?Brief Narrative (HPI from H&P)  51 y.o. 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  She also has some dysuria for the last 24 hours. ? ?She was admitted to the hospital for chest pain, acute on chronic CHF, subsequently developed some abdominal discomfort as well. ? ? Subjective:  ? ?Patient in bed, appears comfortable, denies any headache, no fever, no chest pain or pressure, no shortness of breath , no abdominal pain. No focal weakness. ? ? Assessment  & Plan :  ? ? ?1.  Atypical chest pain with hypertensive urgency/crisis.  Note patient initially claims she was compliant with her blood pressure medications but now states that she was not, with only slight changes to home regimen blood pressure has dropped too low on 11/21/2021, medications have been adjusted, gentle hydration and monitor ?  ?2.  Hypertensive urgency.  Kindly see above, as needed IV hydralazine and Catapres also on board. ?  ?3.  Anxiety and  depression.  Continue Celexa at home dose. ?  ?4.  Dysuria.  Resolved.  UA stable. ?  ?5.  Acute on chronic combined systolic and diastolic CHF EF 96%.  Plan as in #1 above, responded very well to single dose IV Lasix given on 11/19/2021.  Shortness of breath has resolved.  Continue on Coreg due to AKI holding Entresto and Aldactone on 11/21/2021. ? ?  6.  Epigastric pain.  She states that she has been having some epigastric discomfort intermittently for several days, some nausea.  KUB stable, given 2 high doses of Maalox along with twice daily PPI, pain much improved after that, GI on board. ? ?7.  Asymptomatic 7 beat run of NSVT on 11/20/2021 6 AM.  Already on max dose beta-blocker, EF is known to be around 20%, 1 g mag given, will keep potassium around 4 and give 2 runs of IV potassium on 11/20/2021.  Cardiology on board.  Continue telemetry monitoring. ? ?8.  AKI.  Caused by relative hypoperfusion due to low perfusion pressures, patient is not compliant with her blood pressure medications as she earlier claimed, blood pressure now too low, medications adjusted, Entresto and diuretic held, gentle IV fluids on 11/21/2021 and monitor ? ?   ? ?Condition -  Guarded ? ?Family Communication  :  none present ? ?Code Status :  Full ? ?Consults  :  Cards, GI ? ?PUD Prophylaxis : PPI ? ? Procedures  :    ? ?TTE - 1. Left ventricular ejection fraction, by estimation, is 20 to 25%. The left ventricle has severely decreased function. The left ventricle demonstrates global hypokinesis. There is mild left ventricular hypertrophy. Left ventricular diastolic parameters  are indeterminate.  2. Right ventricular systolic function is normal. The right ventricular size is normal.  3. The pericardial effusion is circumferential.  4. The mitral valve is abnormal. Mild mitral valve regurgitation. No evidence of mitral stenosis.  5. The aortic valve is tricuspid. Aortic valve regurgitation is not visualized. No aortic stenosis is present.  6.  IVC is small suggesting low RA pressure and hypovolemia ? ?CTA - No evidence of thoracic or abdominal aortic dissection or aneurysm. Bilateral nonobstructive nephrolithiasis. Mild cardiomegaly. ? ?   ? ?Disposition Plan  :   ? ?Status is: Observation ? ?DVT Prophylaxis  :   ? ?enoxaparin (LOVENOX) injection 40 mg Start: 11/19/21 2000 ? ? ?Lab Results  ?Component Value Date  ? PLT 338 11/21/2021  ? ? ?Diet :  ?Diet Order   ? ?       ?  Diet clear liquid Room service appropriate? Yes; Fluid consistency: Thin  Diet effective now       ?  ? ?  ?  ? ?  ?  ? ?Inpatient Medications ? ?Scheduled Meds: ? [START ON 11/22/2021] amLODipine  10 mg Oral Daily  ? carvedilol  25 mg Oral BID WC  ? citalopram  20 mg Oral Daily  ? dapagliflozin propanediol  10 mg Oral Daily  ? enoxaparin (LOVENOX) injection  40 mg Subcutaneous Q24H  ? feeding supplement  237 mL Oral BID BM  ? hydrALAZINE  50 mg Oral TID  ? [START ON 11/22/2021] isosorbide mononitrate  30 mg Oral Daily  ? ondansetron  4 mg Oral Q6H  ? Or  ? ondansetron (ZOFRAN) IV  4 mg Intravenous Q6H  ? pantoprazole (PROTONIX) IV  40 mg Intravenous Q24H  ? ?Continuous Infusions: ? lactated ringers 100 mL/hr at 11/21/21 3474  ? promethazine (PHENERGAN) injection (IM or IVPB) Stopped (11/20/21 1448)  ? ?PRN Meds:.acetaminophen **OR** acetaminophen, albuterol, alum & mag hydroxide-simeth, bisacodyl, cloNIDine, hydrALAZINE, promethazine (PHENERGAN) injection (IM or IVPB) ? ?Antibiotics  :   ? ?Anti-infectives (From admission, onward)  ? ? None  ? ?  ? ? ? Time Spent in minutes  30 ? ? ?Lala Lund M.D on 11/21/2021 at 10:01 AM ? ?To  page go to [email or web address]  ? ?[facility] -  Office  [phone] ? ?See all Orders from today for further details ? ? ? Objective:  ? ?Vitals:  ? 11/20/21 1728 11/20/21 1938 11/21/21 0420 11/21/21 0500  ?BP: 101/72 109/73 135/87   ?Pulse:  72 69   ?Resp:  16 16   ?Temp:  98 ?F (36.7 ?C) 98.3 ?F (36.8 ?C)   ?TempSrc:  Oral Oral   ?SpO2:  99% 95%    ?Weight:    76.9 kg  ?Height:      ? ? ?Wt Readings from Last 3 Encounters:  ?11/21/21 76.9 kg  ?03/29/21 77.1 kg  ?03/02/21 80.2 kg  ? ? ? ?Intake/Output Summary (Last 24 hours) at 11/21/2021 1001 ?Last data filed at 11/20/2021 1500 ?Gross per 24 hour  ?Intake 250.36 ml  ?Output --  ?Net 250.36 ml  ? ? ? ?Physical Exam ? ?Awake Alert, No new F.N deficits, Normal affect ?[place].AT,PERRAL ?Supple Neck, No JVD,   ?Symmetrical Chest wall movement, Good air movement bilaterally, CTAB ?RRR,No Gallops, Rubs or new Murmurs,  ?+ve B.Sounds, Abd Soft, No tenderness,   ?No Cyanosis, Clubbing or edema  ? ? ? Data Review:  ? ? ?CBC ?Recent Labs  ?Lab 11/19/21 ?1100 11/20/21 ?0923 11/21/21 ?0251  ?WBC 7.2 9.1 15.9*  ?HGB 13.2 16.4* 16.5*  ?HCT 40.6 49.1* 48.5*  ?PLT 282 324 338  ?MCV 81.4 79.3* 80.0  ?MCH 26.5 26.5 27.2  ?MCHC 32.5 33.4 34.0  ?RDW 14.6 14.7 14.9  ?LYMPHSABS  --   --  1.8  ?MONOABS  --   --  0.7  ?EOSABS  --   --  0.0  ?BASOSABS  --   --  0.0  ? ? ?Electrolytes ?Recent Labs  ?Lab 11/19/21 ?1100 11/19/21 ?1219 11/20/21 ?3007 11/20/21 ?6226 11/21/21 ?0251  ?NA 141  --   --  134* 134*  ?K 3.6  --   --  3.6 3.9  ?CL 107  --   --  96* 96*  ?CO2 23  --   --  23 25  ?GLUCOSE 140*  --   --  201* 144*  ?BUN 17  --   --  31* 47*  ?CREATININE 1.03*  --   --  1.67* 1.89*  ?CALCIUM 9.3  --   --  9.3 9.1  ?AST  --  22  --  19 16  ?ALT  --  21  --  20 16  ?ALKPHOS  --  62  --  68 59  ?BILITOT  --  0.9  --  1.2 0.9  ?ALBUMIN  --  3.6  --  3.9 3.6  ?MG  --   --   --  2.7* 2.8*  ?TSH  --  0.540  --   --   --   ?HGBA1C  --   --  6.1*  --   --   ?BNP  --  1,619.6*  --  482.7* 99.4  ? ? ?------------------------------------------------------------------------------------------------------------------ ?No results for input(s): CHOL, HDL, LDLCALC, TRIG, CHOLHDL, LDLDIRECT in the last 72 hours. ? ?Lab Results  ?Component Value Date  ? HGBA1C 6.1 (H) 11/20/2021  ? ? ?Recent Labs  ?  11/19/21 ?1219  ?TSH 0.540   ? ?------------------------------------------------------------------------------------------------------------------ ?ID Labs ?Recent Labs  ?Lab 11/19/21 ?1100 11/20/21 ?3335 11/20/21 ?4562 11/21/21 ?0251  ?WBC 7.2 9.1  --  15.9*  ?PLT 282 324  -

## 2021-11-22 ENCOUNTER — Other Ambulatory Visit (HOSPITAL_COMMUNITY): Payer: Self-pay

## 2021-11-22 DIAGNOSIS — N179 Acute kidney failure, unspecified: Secondary | ICD-10-CM

## 2021-11-22 DIAGNOSIS — R1013 Epigastric pain: Secondary | ICD-10-CM

## 2021-11-22 LAB — COMPREHENSIVE METABOLIC PANEL
ALT: 15 U/L (ref 0–44)
AST: 16 U/L (ref 15–41)
Albumin: 3.4 g/dL — ABNORMAL LOW (ref 3.5–5.0)
Alkaline Phosphatase: 54 U/L (ref 38–126)
Anion gap: 9 (ref 5–15)
BUN: 48 mg/dL — ABNORMAL HIGH (ref 6–20)
CO2: 29 mmol/L (ref 22–32)
Calcium: 9.2 mg/dL (ref 8.9–10.3)
Chloride: 97 mmol/L — ABNORMAL LOW (ref 98–111)
Creatinine, Ser: 1.57 mg/dL — ABNORMAL HIGH (ref 0.44–1.00)
GFR, Estimated: 39 mL/min — ABNORMAL LOW (ref >60)
Glucose, Bld: 109 mg/dL — ABNORMAL HIGH (ref 70–99)
Potassium: 4.2 mmol/L (ref 3.5–5.1)
Sodium: 135 mmol/L (ref 135–145)
Total Bilirubin: 0.7 mg/dL (ref 0.3–1.2)
Total Protein: 7 g/dL (ref 6.5–8.1)

## 2021-11-22 LAB — CBC WITH DIFFERENTIAL/PLATELET
Abs Immature Granulocytes: 0.04 10*3/uL (ref 0.00–0.07)
Basophils Absolute: 0 10*3/uL (ref 0.0–0.1)
Basophils Relative: 0 %
Eosinophils Absolute: 0.1 10*3/uL (ref 0.0–0.5)
Eosinophils Relative: 1 %
HCT: 47.9 % — ABNORMAL HIGH (ref 36.0–46.0)
Hemoglobin: 15.3 g/dL — ABNORMAL HIGH (ref 12.0–15.0)
Immature Granulocytes: 0 %
Lymphocytes Relative: 17 %
Lymphs Abs: 2 10*3/uL (ref 0.7–4.0)
MCH: 26.2 pg (ref 26.0–34.0)
MCHC: 31.9 g/dL (ref 30.0–36.0)
MCV: 82 fL (ref 80.0–100.0)
Monocytes Absolute: 0.6 10*3/uL (ref 0.1–1.0)
Monocytes Relative: 5 %
Neutro Abs: 8.8 10*3/uL — ABNORMAL HIGH (ref 1.7–7.7)
Neutrophils Relative %: 77 %
Platelets: 335 10*3/uL (ref 150–400)
RBC: 5.84 MIL/uL — ABNORMAL HIGH (ref 3.87–5.11)
RDW: 15 % (ref 11.5–15.5)
WBC: 11.6 10*3/uL — ABNORMAL HIGH (ref 4.0–10.5)
nRBC: 0 % (ref 0.0–0.2)

## 2021-11-22 LAB — MAGNESIUM: Magnesium: 2.9 mg/dL — ABNORMAL HIGH (ref 1.7–2.4)

## 2021-11-22 LAB — BRAIN NATRIURETIC PEPTIDE: B Natriuretic Peptide: 36.8 pg/mL (ref 0.0–100.0)

## 2021-11-22 MED ORDER — ADULT MULTIVITAMIN W/MINERALS CH
1.0000 | ORAL_TABLET | Freq: Every day | ORAL | Status: DC
  Administered 2021-11-23 – 2021-11-25 (×3): 1 via ORAL
  Filled 2021-11-22 (×3): qty 1

## 2021-11-22 MED ORDER — HYDRALAZINE HCL 50 MG PO TABS: 50.0000 mg | ORAL_TABLET | Freq: Three times a day (TID) | ORAL | Status: DC

## 2021-11-22 MED ORDER — PANTOPRAZOLE SODIUM 40 MG PO TBEC
40.0000 mg | DELAYED_RELEASE_TABLET | Freq: Two times a day (BID) | ORAL | Status: DC
  Administered 2021-11-22 – 2021-11-25 (×6): 40 mg via ORAL
  Filled 2021-11-22 (×6): qty 1

## 2021-11-22 MED ORDER — BOOST / RESOURCE BREEZE PO LIQD CUSTOM: 1.0000 | Freq: Three times a day (TID) | ORAL | Status: DC

## 2021-11-22 NOTE — Progress Notes (Addendum)
? ? Attending physician's note  ? ?I have taken a history, reviewed the chart, and examined the patient. I performed a substantive portion of this encounter, including complete performance of at least one of the key components, in conjunction with the APP. I agree with the APP's note, impression, and recommendations with my edits.  ? ?Nausea/vomiting has resolved.  She reports her appetite and p.o. intake are near her baseline.  Minimal epigastric pain, mostly TTP, but overall much improved since hospital admission.  No overt bleeding. ? ?WBC 11.6 (from 15.9) ? ?- Plan to continue with conservative management.  No plan for endoscopic evaluation at this juncture given overall clinical improvement in GI symptoms. ?- Changing to high-dose p.o. PPI ?- Please do not hesitate to contact the inpatient GI service with additional questions or concerns ? ?[address] DO, FACG ?[phone] office  ? ?   ? ?       ? ? ? Daily Rounding Note ? ?11/22/2021, 1:41 PM ? LOS: 1 day  ? ?SUBJECTIVE:   ?Chief complaint:   lower chest and epig pain, nonbloody n/v  ?The nausea and vomiting have resolved.  She is tolerating soft diet but not very hungry.  Even at home her appetite is depressed.  No chest or epigastric pain though some tenderness to palpation still present.  Did not have a bowel movement yesterday or today and her norm is to have a bowel movement every 2 to 3 days so she does not feel constipated. ? ?OBJECTIVE:        ? Vital signs in last 24 hours:    ?Temp:  [97.5 ?F (36.4 ?C)-98.1 ?F (36.7 ?C)] 98.1 ?F (36.7 ?C) (05/15 0981) ?Pulse Rate:  [58-62] 58 (05/15 0558) ?Resp:  [16-17] 17 (05/15 0558) ?BP: (114-126)/(76-79) 126/78 (05/15 1914) ?SpO2:  [97 %-99 %] 99 % (05/15 0558) ?Weight:  [74 kg] 74 kg (05/15 0558) ?Last BM Date : 11/20/21 ?Filed Weights  ? 11/20/21 0349 11/21/21 0500 11/22/21 0558  ?Weight: 75.2 kg 76.9 kg 74 kg  ? ?General: Looks old for stated age  and somewhat chronically ill.  Resting comfortably on the bed. ?Heart: RRR. ?Chest: No labored breathing or cough.  Good breath sounds bilaterally. ?Abdomen: Slight epigastric tenderness without guarding or rebound.  No distention.  Bowel sounds active. ?Extremities: No CCE E. ?Neuro/Psych: A little bit sleepy but awakens easily.  Cooperative.  No confusion. ? ?Intake/Output from previous day: ?05/14 0701 - 05/15 0700 ?In: 1570.4 [P.O.:480; I.V.:1090.4] ?Out: 750 [Urine:750] ? ?Intake/Output this shift: ?No intake/output data recorded. ? ?Lab Results: ?Recent Labs  ?  11/20/21 ?7829 11/21/21 ?0251 11/22/21 ?0110  ?WBC 9.1 15.9* 11.6*  ?HGB 16.4* 16.5* 15.3*  ?HCT 49.1* 48.5* 47.9*  ?PLT 324 338 335  ? ?BMET ?Recent Labs  ?  11/20/21 ?5621 11/21/21 ?0251 11/22/21 ?0110  ?NA 134* 134* 135  ?K 3.6 3.9 4.2  ?CL 96* 96* 97*  ?CO2 '23 25 29  '$ ?GLUCOSE 201* 144* 109*  ?BUN 31* 47* 48*  ?CREATININE 1.67* 1.89* 1.57*  ?CALCIUM 9.3 9.1 9.2  ? ?LFT ?Recent Labs  ?  11/20/21 ?3086 11/21/21 ?0251 11/22/21 ?0110  ?PROT 7.7 7.1 7.0  ?ALBUMIN 3.9 3.6 3.4*  ?AST '19 16 16  '$ ?ALT '20 16 15  '$ ?ALKPHOS 68 59 54  ?BILITOT 1.2 0.9 0.7  ? ?PT/INR ?No results for input(s): LABPROT, INR in the last 72 hours. ?Hepatitis Panel ?No results for input(s): HEPBSAG, HCVAB, HEPAIGM, HEPBIGM in the last 72  hours. ? ?Studies/Results: ?ECHOCARDIOGRAM COMPLETE ? ?Result Date: 11/20/2021 ? IMPRESSIONS  1. Left ventricular ejection fraction, by estimation, is 20 to 25%. The left ventricle has severely decreased function. The left ventricle demonstrates global hypokinesis. There is mild left ventricular hypertrophy. Left ventricular diastolic parameters  are indeterminate.  2. Right ventricular systolic function is normal. The right ventricular size is normal.  3. The pericardial effusion is circumferential.  4. The mitral valve is abnormal. Mild mitral valve regurgitation. No evidence of mitral stenosis.  5. The aortic valve is tricuspid. Aortic valve  regurgitation is not visualized. No aortic stenosis is present.  6. IVC is small suggesting low RA pressure and hypovolemia. Electronically signed by Carlyle Dolly MD Signature Date/Time: 11/20/2021/3:02:06 PM    Final    ? ?Scheduled Meds: ? carvedilol  25 mg Oral BID WC  ? citalopram  20 mg Oral Daily  ? dapagliflozin propanediol  10 mg Oral Daily  ? enoxaparin (LOVENOX) injection  40 mg Subcutaneous Q24H  ? feeding supplement  237 mL Oral BID BM  ? [START ON 11/23/2021] hydrALAZINE  50 mg Oral TID  ? isosorbide mononitrate  30 mg Oral Daily  ? pantoprazole (PROTONIX) IV  40 mg Intravenous Q24H  ? ?Continuous Infusions: ? promethazine (PHENERGAN) injection (IM or IVPB) Stopped (11/20/21 1448)  ? ?PRN Meds:.acetaminophen **OR** acetaminophen, albuterol, alum & mag hydroxide-simeth, bisacodyl, cloNIDine, hydrALAZINE, promethazine (PHENERGAN) injection (IM or IVPB) ? ? ?ASSESMENT:  ? ?Upper abdominal and chest soreness, nausea/nonbloody emesis.  No active gastrointestinal disease on CT scan.  Protonix 40 mg IV bid day 3.  No PI etc PTA.  SXS much better.  Still some mild epig tenderness ? ?Nonischemic cardiomyopathy with LVEF 20 to 25%. ? ?Hypertensive urgency in setting of medication noncompliance. ? ?AKI.  Being managed conservatively in regards to IV fluids.  Improving.   ? ? ?PLAN  ? ?  Switch to Protonix 40 mg po bid.   ? ? ? ?Azucena Freed  11/22/2021, 1:41 PM ?Phone [phone]  ?

## 2021-11-22 NOTE — Progress Notes (Addendum)
? ?Progress Note ? ?Patient Name: Emily Key ?Date of Encounter: 11/22/2021 ? ?[facility] Cardiologist: Dr. Haroldine Laws ? ?Subjective  ? ?No acute overnight events. Patient continues to have epigastric pain that radiates up to her chest at times as well as around to her back. She also continues to have some nausea. She does states it is a little better than yesterday but she still has no appetite. She states she had a little water to drink yesterday but nothing much to eat. No other chest pain. No shortness of breath. She notes some heart racing when she is having mor abdominal pain.  ? ?Inpatient Medications  ?  ?Scheduled Meds: ? carvedilol  25 mg Oral BID WC  ? citalopram  20 mg Oral Daily  ? dapagliflozin propanediol  10 mg Oral Daily  ? enoxaparin (LOVENOX) injection  40 mg Subcutaneous Q24H  ? feeding supplement  237 mL Oral BID BM  ? [START ON 11/23/2021] hydrALAZINE  50 mg Oral TID  ? isosorbide mononitrate  30 mg Oral Daily  ? ondansetron  4 mg Oral Q6H  ? Or  ? ondansetron (ZOFRAN) IV  4 mg Intravenous Q6H  ? pantoprazole (PROTONIX) IV  40 mg Intravenous Q24H  ? ?Continuous Infusions: ? promethazine (PHENERGAN) injection (IM or IVPB) Stopped (11/20/21 1448)  ? ?PRN Meds: ?acetaminophen **OR** acetaminophen, albuterol, alum & mag hydroxide-simeth, bisacodyl, cloNIDine, hydrALAZINE, promethazine (PHENERGAN) injection (IM or IVPB)  ? ?Vital Signs  ?  ?Vitals:  ? 11/21/21 1400 11/21/21 2019 11/22/21 0055 11/22/21 0558  ?BP: 125/76 123/77 114/79 126/78  ?Pulse: 62   (!) 58  ?Resp: '17 16  17  '$ ?Temp: (!) 97.5 ?F (36.4 ?C) 97.6 ?F (36.4 ?C)  98.1 ?F (36.7 ?C)  ?TempSrc: Oral Oral  Oral  ?SpO2: 97% 97%  99%  ?Weight:    74 kg  ?Height:      ? ? ?Intake/Output Summary (Last 24 hours) at 11/22/2021 0735 ?Last data filed at 11/22/2021 0605 ?Gross per 24 hour  ?Intake 1570.35 ml  ?Output 750 ml  ?Net 820.35 ml  ? ? ?  11/22/2021  ?  5:58 AM 11/21/2021  ?  5:00 AM 11/20/2021  ?  3:49 AM  ?Last 3 Weights  ?Weight (lbs)  163 lb 3.2 oz 169 lb 8.5 oz 165 lb 11.2 oz  ?Weight (kg) 74.027 kg 76.9 kg 75.161 kg  ?   ? ?Telemetry  ?  ?Normal sinus rhythm with rates in the 60s. - Personally Reviewed ? ?ECG  ?  ?No new ECG tracing today. - Personally Reviewed ? ?Physical Exam  ? ?GEN: Thin African-American female resting comfortably in no acute distress.   ?Neck: No JVD. ?Cardiac: RRR. No murmurs, rubs, or gallops.  ?Respiratory: Clear to auscultation bilaterally. No wheezes, rhonchi, or rales. ?GI: Soft and non-distended. Diffusely tender to palpation. ?MS: No lower extremity edema. No deformity. ?Skin: Warm and dry. ?Neuro:  No focal deficits.  ?Psych: Normal affect. Responds appropriately. ? ?Labs  ?  ?High Sensitivity Troponin:   ?Recent Labs  ?Lab 11/19/21 ?1100 11/19/21 ?1520  ?TROPONINIHS 32* 21*  ?   ?Chemistry ?Recent Labs  ?Lab 11/20/21 ?4076 11/21/21 ?0251 11/22/21 ?0110  ?NA 134* 134* 135  ?K 3.6 3.9 4.2  ?CL 96* 96* 97*  ?CO2 '23 25 29  '$ ?GLUCOSE 201* 144* 109*  ?BUN 31* 47* 48*  ?CREATININE 1.67* 1.89* 1.57*  ?CALCIUM 9.3 9.1 9.2  ?MG 2.7* 2.8* 2.9*  ?PROT 7.7 7.1 7.0  ?ALBUMIN 3.9 3.6  3.4*  ?AST '19 16 16  '$ ?ALT '20 16 15  '$ ?ALKPHOS 68 59 54  ?BILITOT 1.2 0.9 0.7  ?GFRNONAA 37* 32* 39*  ?ANIONGAP '15 13 9  '$ ?  ?Lipids No results for input(s): CHOL, TRIG, HDL, LABVLDL, LDLCALC, CHOLHDL in the last 168 hours.  ?Hematology ?Recent Labs  ?Lab 11/20/21 ?2620 11/21/21 ?0251 11/22/21 ?0110  ?WBC 9.1 15.9* 11.6*  ?RBC 6.19* 6.06* 5.84*  ?HGB 16.4* 16.5* 15.3*  ?HCT 49.1* 48.5* 47.9*  ?MCV 79.3* 80.0 82.0  ?MCH 26.5 27.2 26.2  ?MCHC 33.4 34.0 31.9  ?RDW 14.7 14.9 15.0  ?PLT 324 338 335  ? ?Thyroid  ?Recent Labs  ?Lab 11/19/21 ?1219  ?TSH 0.540  ?  ?BNP ?Recent Labs  ?Lab 11/20/21 ?3559 11/21/21 ?0251 11/22/21 ?0110  ?BNP 482.7* 99.4 36.8  ?  ?DDimer No results for input(s): DDIMER in the last 168 hours.  ? ?Radiology  ?  ?DG Abd Portable 1V ? ?Result Date: 11/20/2021 ?CLINICAL DATA:  Nausea and vomiting EXAM: PORTABLE ABDOMEN - 1 VIEW  COMPARISON:  01/02/2019 FINDINGS: The bowel gas pattern is normal. Multiple bilateral renal calculi. Tubal ligation clips. Clear lung bases. IMPRESSION: 1. Normal bowel gas pattern. 2. Bilateral nephrolithiasis. Electronically Signed   By: Jorje Guild M.D.   On: 11/20/2021 08:11  ? ?ECHOCARDIOGRAM COMPLETE ? ?Result Date: 11/20/2021 ?   ECHOCARDIOGRAM REPORT   Patient Name:   Emily Key Date of Exam: 11/20/2021 Medical Rec #:  [id number]     Height:       67.0 in Accession #:    [id number]    Weight:       165.7 lb Date of Birth:  05-14-1971     BSA:          1.867 m? Patient Age:    51 years      BP:           114/85 mmHg Patient Gender: F             HR:           73 bpm. Exam Location:  Inpatient Procedure: 2D Echo, Cardiac Doppler and Color Doppler Indications:    CHF-acute diastolic  History:        Patient has prior history of Echocardiogram examinations, most                 recent 12/22/2020. Risk Factors:Dyslipidemia and Hypertension.  Sonographer:    Clayton Lefort RDCS (AE) Referring Phys: [id number] [facility]  Sonographer Comments: Patient moving constantly. IMPRESSIONS  1. Left ventricular ejection fraction, by estimation, is 20 to 25%. The left ventricle has severely decreased function. The left ventricle demonstrates global hypokinesis. There is mild left ventricular hypertrophy. Left ventricular diastolic parameters  are indeterminate.  2. Right ventricular systolic function is normal. The right ventricular size is normal.  3. The pericardial effusion is circumferential.  4. The mitral valve is abnormal. Mild mitral valve regurgitation. No evidence of mitral stenosis.  5. The aortic valve is tricuspid. Aortic valve regurgitation is not visualized. No aortic stenosis is present.  6. IVC is small suggesting low RA pressure and hypovolemia. FINDINGS  Left Ventricle: Left ventricular ejection fraction, by estimation, is 20 to 25%. The left ventricle has severely decreased function. The left ventricle  demonstrates global hypokinesis. The left ventricular internal cavity size was normal in size. There is mild left ventricular hypertrophy. Left ventricular diastolic parameters are indeterminate. Right Ventricle: The right ventricular size is normal. No  increase in right ventricular wall thickness. Right ventricular systolic function is normal. Left Atrium: Left atrial size was normal in size. Right Atrium: Right atrial size was normal in size. Pericardium: Trivial pericardial effusion is present. The pericardial effusion is circumferential. Mitral Valve: The mitral valve is abnormal. There is mild thickening of the mitral valve leaflet(s). There is mild calcification of the mitral valve leaflet(s). Mild mitral annular calcification. Mild mitral valve regurgitation. No evidence of mitral valve stenosis. MV peak gradient, 2.3 mmHg. The mean mitral valve gradient is 1.0 mmHg. Tricuspid Valve: The tricuspid valve is normal in structure. Tricuspid valve regurgitation is trivial. No evidence of tricuspid stenosis. Aortic Valve: The aortic valve is tricuspid. Aortic valve regurgitation is not visualized. No aortic stenosis is present. Aortic valve mean gradient measures 1.0 mmHg. Aortic valve peak gradient measures 2.8 mmHg. Aortic valve area, by VTI measures 1.81 cm?. Pulmonic Valve: The pulmonic valve was not well visualized. Pulmonic valve regurgitation is mild. No evidence of pulmonic stenosis. Aorta: The aortic root is normal in size and structure. Venous: IVC is small suggesting low RA pressure and hypovolemia. IAS/Shunts: No atrial level shunt detected by color flow Doppler.  LEFT VENTRICLE PLAX 2D LVIDd:         5.90 cm LVIDs:         5.20 cm LV PW:         1.20 cm LV IVS:        1.10 cm LVOT diam:     2.00 cm LV SV:         22 LV SV Index:   12 LVOT Area:     3.14 cm?  LV Volumes (MOD) LV vol d, MOD A2C: 84.6 ml LV vol d, MOD A4C: 104.0 ml LV vol s, MOD A2C: 73.8 ml LV vol s, MOD A4C: 80.4 ml LV SV MOD A2C:      10.8 ml LV SV MOD A4C:     104.0 ml LV SV MOD BP:      19.2 ml RIGHT VENTRICLE            IVC RV Basal diam:  2.40 cm    IVC diam: 0.70 cm RV S prime:     6.09 cm/s TAPSE (M-mode): 0.7 cm LEFT ATRIUM             Index

## 2021-11-22 NOTE — Progress Notes (Signed)
Initial Nutrition Assessment ? ?DOCUMENTATION CODES:  ? ?Not applicable ? ?INTERVENTION:  ? ?Boost Breeze po TID, each supplement provides 250 kcal and 9 grams of protein ? ?MVI po daily  ? ?Pt at high refeed risk; recommend monitor potassium, magnesium and phosphorus labs daily until stable ? ?NUTRITION DIAGNOSIS:  ? ?Inadequate oral intake related to acute illness as evidenced by per patient/family report. ? ?GOAL:  ? ?Patient will meet greater than or equal to 90% of their needs ? ?MONITOR:  ? ?PO intake, Supplement acceptance, Labs, Weight trends, I & O's, Skin ? ?REASON FOR ASSESSMENT:  ? ?Malnutrition Screening Tool ?  ? ?ASSESSMENT:  ? ?51 y/o female with h/o HTN, CHF, CKD, NSTEMI, marijuna use, seizures, hiatal hernia, HLD and left empyema s/p VATS (2016) who is admitted with atypical chest pain with hypertensive urgency/crisis. ? ?RD working remotely. ? ?Unable to reach pt by phone. Per chart review, pt with poor oral intake for several days pta r/t abdominal pain and nausea. CT scan with no significant GI findings. Nausea and vomiting has resolved today but pt continues to have poor oral intake in hospital. Pt is refusing Ensure supplements. RD will change pt from Ensure to Boost Breeze to see if patient likes this better. RD will add daily MVI. Pt is likely at refeed risk. There is limited weight history in chart to determine if any recent significant weight changes; pt does appear to be down ~9lbs(5%) since July. RD will obtain nutrition related history and exam at follow-up.  ? ?Medications reviewed and include: celexa, lovenox, protonix ? ?Labs reviewed: K 4.2 wnl, BUN 48(H), creat 1.57(H), Mg 2.9(H) ?Wbc- 11.6(H) ? ?NUTRITION - FOCUSED PHYSICAL EXAM: ?Unable to perform at this time  ? ?Diet Order:   ?Diet Order   ? ?       ?  DIET SOFT Room service appropriate? Yes; Fluid consistency: Thin  Diet effective now       ?  ? ?  ?  ? ?  ? ?EDUCATION NEEDS:  ? ?No education needs have been identified at this  time ? ?Skin:  Skin Assessment: Reviewed RN Assessment ? ?Last BM:  5/13 ? ?Height:  ? ?Ht Readings from Last 1 Encounters:  ?11/19/21 '5\' 7"'$  (1.702 m)  ? ? ?Weight:  ? ?Wt Readings from Last 1 Encounters:  ?11/22/21 74 kg  ? ? ?Ideal Body Weight:  61.36 kg ? ?BMI:  Body mass index is 25.56 kg/m?. ? ?Estimated Nutritional Needs:  ? ?Kcal:  1700-1900kcal/day ? ?Protein:  85-95g/day ? ?Fluid:  1.5-1.8L/day ? ?Koleen Distance MS, RD, LDN ?Please refer to AMION for RD and/or RD on-call/weekend/after hours pager ? ?

## 2021-11-22 NOTE — Progress Notes (Signed)
Heart Failure Navigator Progress Note ? ?Assessed for Heart & Vascular TOC clinic readiness.  ?Patient does not meet criteria due to a patient with the [facility] Failure Team.  ? ? ? ?Earnestine Leys, BSN, RN ?Heart Failure Nurse Navigator ?Secure Chat Only   ?

## 2021-11-22 NOTE — Progress Notes (Signed)
?                                  PROGRESS NOTE                                             ?                                                                                                                     ?                                         ? ? Patient Demographics:  ? ? Emily Key, is a 51 y.o. female, DOB - [birth year], QPY:[id number] ? ?Outpatient Primary MD for the patient is Vevelyn Francois, NP    LOS - 1  Admit date - 11/19/2021   ? ?Chief Complaint  ?Patient presents with  ? Chest Pain  ?    ? ?Brief Narrative (HPI from H&P)  51 y.o. 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  She also has some dysuria for the last 24 hours. ? ?She was admitted to the hospital for chest pain, acute on chronic CHF, subsequently developed some abdominal discomfort as well. ? ? Subjective:  ? ?Patient in bed, appears comfortable, denies any headache, no fever, no chest pain or pressure, no shortness of breath , no abdominal pain. No new focal weakness. ? ? Assessment  & Plan :  ? ? ?1.  Atypical chest pain with hypertensive urgency/crisis.  Resolved likely due to hypertensive crisis.  Cardiology on board.  Echo noted essentially unchanged. ?  ?2.  Hypertensive urgency.  She now claims that she is not compliant with her blood pressure medications and often misses the night doses, currently on one fourth of her home pressure medications and blood pressure running low, appropriate adjustments made.  Patient counseled on  compliance. ?  ?3.  Anxiety and depression.  Continue Celexa at home dose. ?  ?4.  Dysuria.  Resolved.  UA stable. ?  ?5.  Acute on chronic combined systolic and diastolic CHF EF 12%.  Plan as in #1 above, responded very well to single dose IV Lasix given on 11/19/2021.  Shortness of breath has resolved.  Continue on Coreg due to AKI holding Entresto and  Aldactone on 11/21/2021. ? ?6.  Epigastric pain.  She states that she has been having some epigastric discomfort intermittently for several days, some nausea.  KUB stable, given 2 high doses of Maalox along with twice daily PPI, pain much improved after that, GI on board. ? ?7.  Asymptomatic 7 beat run of NSVT on 11/20/2021 6 AM.  Already on max dose beta-blocker, EF is known to be around 20%, 1 g mag given, will keep potassium around 4 and give 2 runs of IV potassium on 11/20/2021.  Cardiology on board.  Continue telemetry monitoring. ? ?8.  AKI.  Caused by relative hypoperfusion due to low perfusion pressures, patient is not compliant with her blood pressure medications as she earlier claimed, blood pressure now too low, medications adjusted, Entresto and diuretic held, gentle IV fluids on 11/21/2021, improved continue to hold diuretics and most of her home blood pressure medications on 11/22/2021.  AKI improving. ? ?   ? ?Condition -  Guarded ? ?Family Communication  :  none present ? ?Code Status :  Full ? ?Consults  :  Cards, GI ? ?PUD Prophylaxis : PPI ? ? Procedures  :    ? ?TTE - 1. Left ventricular ejection fraction, by estimation, is 20 to 25%. The left ventricle has severely decreased function. The left ventricle demonstrates global hypokinesis. There is mild left ventricular hypertrophy. Left ventricular diastolic parameters  are indeterminate.  2. Right ventricular systolic function is normal. The right ventricular size is normal.  3. The pericardial effusion is circumferential.  4. The mitral valve is abnormal. Mild mitral valve regurgitation. No evidence of  mitral stenosis.  5. The aortic valve is tricuspid. Aortic valve regurgitation is not visualized. No aortic stenosis is present.  6. IVC is small suggesting low RA pressure and hypovolemia ? ?CTA - No evidence of thoracic or abdominal aortic dissection or aneurysm. Bilateral nonobstructive nephrolithiasis. Mild cardiomegaly. ? ?   ? ?Disposition Plan  :   ? ?Status is: Observation ? ?DVT Prophylaxis  :   ? ?enoxaparin (LOVENOX) injection 40 mg Start: 11/19/21 2000 ? ? ?Lab Results  ?Component Value Date  ? PLT 335 11/22/2021  ? ? ?Diet :  ?Diet Order   ? ?       ?  DIET SOFT Room service appropriate? Yes; Fluid consistency: Thin  Diet effective now       ?  ? ?  ?  ? ?  ?  ? ?Inpatient Medications ? ?Scheduled Meds: ? carvedilol  25 mg Oral BID WC  ? citalopram  20 mg Oral Daily  ? dapagliflozin propanediol  10 mg Oral Daily  ? enoxaparin (LOVENOX) injection  40 mg Subcutaneous Q24H  ? feeding supplement  237 mL Oral BID BM  ? [START ON 11/23/2021] hydrALAZINE  50 mg Oral TID  ? isosorbide mononitrate  30 mg Oral Daily  ? ondansetron  4 mg Oral Q6H  ? Or  ? ondansetron (ZOFRAN) IV  4 mg Intravenous Q6H  ? pantoprazole (PROTONIX) IV  40 mg Intravenous Q24H  ? ?Continuous Infusions: ? promethazine (PHENERGAN) injection (IM or IVPB) Stopped (11/20/21 1448)  ? ?PRN Meds:.acetaminophen **OR** acetaminophen, albuterol, alum & mag hydroxide-simeth, bisacodyl, cloNIDine, hydrALAZINE, promethazine (PHENERGAN) injection (IM or IVPB) ? ?Antibiotics  :   ? ?Anti-infectives (From admission, onward)  ? ? None  ? ?  ? ? ? Time Spent in minutes  30 ? ? ?Lala Lund M.D on 11/22/2021 at 8:45 AM ? ?To page  go to [email or web address]  ? ?[facility] -  Office  [phone] ? ?See all Orders from today for further details ? ? ? Objective:  ? ?Vitals:  ? 11/21/21 1400 11/21/21 2019 11/22/21 0055 11/22/21 0558  ?BP: 125/76 123/77 114/79 126/78  ?Pulse: 62   (!) 58  ?Resp: '17 16  17  '$ ?Temp: (!) 97.5 ?F (36.4 ?C) 97.6 ?F (36.4 ?C)  98.1  ?F (36.7 ?C)  ?TempSrc: Oral Oral  Oral  ?SpO2: 97% 97%  99%  ?Weight:    74 kg  ?Height:      ? ? ?Wt Readings from Last 3 Encounters:  ?11/22/21 74 kg  ?03/29/21 77.1 kg  ?03/02/21 80.2 kg  ? ? ? ?Intake/Output Summary (Last 24 hours) at 11/22/2021 0845 ?Last data filed at 11/22/2021 0605 ?Gross per 24 hour  ?Intake 1570.35 ml  ?Output 750 ml  ?Net 820.35 ml  ? ? ? ?Physical Exam ? ?Awake Alert, No new F.N deficits, Normal affect ?[place].AT,PERRAL ?Supple Neck, No JVD,   ?Symmetrical Chest wall movement, Good air movement bilaterally, CTAB ?RRR,No Gallops, Rubs or new Murmurs,  ?+ve B.Sounds, Abd Soft, No tenderness,   ?No Cyanosis, Clubbing or edema  ? ? ? Data Review:  ? ? ?CBC ?Recent Labs  ?Lab 11/19/21 ?1100 11/20/21 ?0981 11/21/21 ?0251 11/22/21 ?0110  ?WBC 7.2 9.1 15.9* 11.6*  ?HGB 13.2 16.4* 16.5* 15.3*  ?HCT 40.6 49.1* 48.5* 47.9*  ?PLT 282 324 338 335  ?MCV 81.4 79.3* 80.0 82.0  ?MCH 26.5 26.5 27.2 26.2  ?MCHC 32.5 33.4 34.0 31.9  ?RDW 14.6 14.7 14.9 15.0  ?LYMPHSABS  --   --  1.8 2.0  ?MONOABS  --   --  0.7 0.6  ?EOSABS  --   --  0.0 0.1  ?BASOSABS  --   --  0.0 0.0  ? ? ?Electrolytes ?Recent Labs  ?Lab 11/19/21 ?1100 11/19/21 ?1219 11/20/21 ?1914 11/20/21 ?7829 11/21/21 ?0251 11/22/21 ?0110  ?NA 141  --   --  134* 134* 135  ?K 3.6  --   --  3.6 3.9 4.2  ?CL 107  --   --  96* 96* 97*  ?CO2 23  --   --  '23 25 29  '$ ?GLUCOSE 140*  --   --  201* 144* 109*  ?BUN 17  --   --  31* 47* 48*  ?CREATININE 1.03*  --   --  1.67* 1.89* 1.57*  ?CALCIUM 9.3  --   --  9.3 9.1 9.2  ?AST  --  22  --  '19 16 16  '$ ?ALT  --  21  --  '20 16 15  '$ ?ALKPHOS  --  62  --  68 59 54  ?BILITOT  --  0.9  --  1.2 0.9 0.7  ?ALBUMIN  --  3.6  --  3.9 3.6 3.4*  ?MG  --   --   --  2.7* 2.8* 2.9*  ?TSH  --  0.540  --   --   --   --   ?HGBA1C  --   --  6.1*  --   --   --   ?BNP  --  1,619.6*  --  482.7* 99.4 36.8  ? ? ?Radiology Reports ?DG Chest 2 View ? ?Result Date: 11/19/2021 ?CLINICAL DATA:  Central chest pain 1 week. Shortness of breath with  nausea and vomiting. EXAM: CHEST - 2 VIEW COMPARISON:  12/21/2020 FINDINGS: Lungs are adequately inflated without focal airspace consolidation or  effusion. Minimal stable blunting of the left costophrenic angle. Musician

## 2021-11-22 NOTE — Care Management (Signed)
1542 11-22-21 Case Manager spoke with the patient regarding home health services. No insurance is currently on file. Patient states she has [employer]- Case Manager asked for patient to provide a card to billing if the card is active. Case Manager reached out to the Pharmacy tech and he is checking for valid insurance- no insurance found. Patient declines home health PT/OT, DME rolling walker, and shower seat at this time. No further needs identified by Case Manager at this time.  ?

## 2021-11-22 NOTE — Progress Notes (Signed)
Physical Therapy Treatment ?Patient Details ?Name: Emily Key ?MRN: [id number] ?DOB: [birth year] ?Today's Date: 11/22/2021 ? ? ?History of Present Illness The pt is a 51 yo female presenting 5/12 with chest pain that radiates to R and L side x 1 week with SOB and nausea. Pt found to have HTN emergency and acute CHF exacerbation. PMH includes: CKD, poorly controlled HTN, NSTEMI, and CHF. ? ?  ?PT Comments  ? ? The pt was agreeable to session with focus on continued mobility progression this morning. She reports feeling more well-rested than last session, and was able to complete short bout of ambulation in the room with minG but demos mild instability and progressively reached for more UE support to steady. In the hallway, the pt reports onset of dizziness in L eye, and required minA and HHA to steady with gait. The pt had no overt LOB, but presents with deficits in activity tolerance and stability compared to baseline independence and will therefore benefit from continued skilled PT acutely and HHPT to maximize functional recovery.  ? ?BP stable with all changes in position, pt reports HA unchanged through session.  ?   ?Recommendations for follow up therapy are one component of a multi-disciplinary discharge planning process, led by the attending physician.  Recommendations may be updated based on patient status, additional functional criteria and insurance authorization. ? ?Follow Up Recommendations ? Home health PT ?  ?  ?Assistance Recommended at Discharge Frequent or constant Supervision/Assistance  ?Patient can return home with the following A little help with walking and/or transfers;A little help with bathing/dressing/bathroom;Assistance with cooking/housework;Assist for transportation;Help with stairs or ramp for entrance ?  ?Equipment Recommendations ? Rolling walker (2 wheels) (shower seat)  ?  ?Recommendations for Other Services   ? ? ?  ?Precautions / Restrictions Precautions ?Precautions:  Fall ?Precaution Comments: watch BP ?Restrictions ?Weight Bearing Restrictions: No  ?  ? ?Mobility ? Bed Mobility ?Overal bed mobility: Independent ?  ?  ?  ?  ?  ?  ?General bed mobility comments: pt completing without increased time or cues ?  ? ?Transfers ?Overall transfer level: Needs assistance ?Equipment used: None ?Transfers: Sit to/from Stand ?Sit to Stand: Min guard ?  ?  ?  ?  ?  ?General transfer comment: minG for safety, pt completing without assist, mild instability but no UE support ?  ? ?Ambulation/Gait ?Ambulation/Gait assistance: Min guard, Min assist ?Gait Distance (Feet): 75 Feet ?Assistive device: None, 1 person hand held assist ?Gait Pattern/deviations: Step-through pattern, Decreased stride length, Drifts right/left ?Gait velocity: decreased ?Gait velocity interpretation: <1.31 ft/sec, indicative of household ambulator ?  ?General Gait Details: pt with small steps with minimal clearance and stride length. reports onset of dizziness in L eye with gait, resolved in sitting, drifting to R and L with gait and eventually needing minA through HHA to steady ? ?  ?Balance Overall balance assessment: Needs assistance ?Sitting-balance support: Bilateral upper extremity supported, Feet supported ?Sitting balance-Leahy Scale: Fair ?  ?  ?Standing balance support: No upper extremity supported, Single extremity supported, During functional activity ?Standing balance-Leahy Scale: Fair ?Standing balance comment: pt initially ambulaitng without UEs upport, but demos mild instability progressing to needing minA through single HHA. drifting to R and L with gait, poor ability to self-correct ?  ?  ?  ?  ?  ?  ?  ?  ?  ?  ?  ?  ? ?  ?Cognition Arousal/Alertness: Awake/alert ?Behavior During Therapy: Flat affect ?Overall Cognitive Status: No  family/caregiver present to determine baseline cognitive functioning ?Area of Impairment: Safety/judgement, Awareness, Problem solving ?  ?  ?  ?  ?  ?  ?  ?  ?  ?  ?  ?   ?Safety/Judgement: Decreased awareness of deficits ?Awareness: Emergent ?Problem Solving: Slow processing, Decreased initiation, Difficulty sequencing ?General Comments: pt with flat affect and slow to respond but able to follow cues. poor insight to asking for assist or self-monitoring of exertion. ?  ?  ? ?  ?Exercises   ? ?  ?General Comments General comments (skin integrity, edema, etc.): BP stable throughout ?  ?  ? ?Pertinent Vitals/Pain Pain Assessment ?Pain Assessment: Faces ?Faces Pain Scale: Hurts little more ?Pain Location: headache ?Pain Descriptors / Indicators: Discomfort, Headache ?Pain Intervention(s): Limited activity within patient's tolerance, Monitored during session, Repositioned  ? ? ? ?PT Goals (current goals can now be found in the care plan section) Acute Rehab PT Goals ?Patient Stated Goal: to get some rest ?PT Goal Formulation: With patient ?Time For Goal Achievement: 12/04/21 ?Potential to Achieve Goals: Good ?Progress towards PT goals: Progressing toward goals ? ?  ?Frequency ? ? ? Min 3X/week ? ? ? ?  ?PT Plan Current plan remains appropriate  ? ? ?   ?AM-PAC PT "6 Clicks" Mobility   ?Outcome Measure ? Help needed turning from your back to your side while in a flat bed without using bedrails?: A Little ?Help needed moving from lying on your back to sitting on the side of a flat bed without using bedrails?: A Little ?Help needed moving to and from a bed to a chair (including a wheelchair)?: A Little ?Help needed standing up from a chair using your arms (e.g., wheelchair or bedside chair)?: A Little ?Help needed to walk in hospital room?: A Little ?Help needed climbing 3-5 steps with a railing? : A Little ?6 Click Score: 18 ? ?  ?End of Session Equipment Utilized During Treatment: Gait belt ?Activity Tolerance: Patient limited by fatigue ?Patient left: in chair;with call bell/phone within reach ?Nurse Communication: Mobility status ?PT Visit Diagnosis: Unsteadiness on feet  (R26.81);Muscle weakness (generalized) (M62.81) ?  ? ? ?Time: 6578-4696 ?PT Time Calculation (min) (ACUTE ONLY): 23 min ? ?Charges:  $Gait Training: 8-22 mins ?$Therapeutic Exercise: 8-22 mins          ?          ? ?West Carbo, PT, DPT  ? ?Acute Rehabilitation Department ?Pager #: [phone] ? ? ?Sandra Cockayne ?11/22/2021, 9:32 AM ? ?

## 2021-11-23 ENCOUNTER — Inpatient Hospital Stay: Payer: Self-pay

## 2021-11-23 LAB — COMPREHENSIVE METABOLIC PANEL
ALT: 13 U/L (ref 0–44)
AST: 17 U/L (ref 15–41)
Albumin: 3.3 g/dL — ABNORMAL LOW (ref 3.5–5.0)
Alkaline Phosphatase: 49 U/L (ref 38–126)
Anion gap: 8 (ref 5–15)
BUN: 47 mg/dL — ABNORMAL HIGH (ref 6–20)
CO2: 27 mmol/L (ref 22–32)
Calcium: 8.9 mg/dL (ref 8.9–10.3)
Chloride: 98 mmol/L (ref 98–111)
Creatinine, Ser: 1.68 mg/dL — ABNORMAL HIGH (ref 0.44–1.00)
GFR, Estimated: 36 mL/min — ABNORMAL LOW (ref >60)
Glucose, Bld: 119 mg/dL — ABNORMAL HIGH (ref 70–99)
Potassium: 4.2 mmol/L (ref 3.5–5.1)
Sodium: 133 mmol/L — ABNORMAL LOW (ref 135–145)
Total Bilirubin: 0.7 mg/dL (ref 0.3–1.2)
Total Protein: 6.6 g/dL (ref 6.5–8.1)

## 2021-11-23 LAB — CBC WITH DIFFERENTIAL/PLATELET
Abs Immature Granulocytes: 0.02 10*3/uL (ref 0.00–0.07)
Basophils Absolute: 0 10*3/uL (ref 0.0–0.1)
Basophils Relative: 0 %
Eosinophils Absolute: 0.1 10*3/uL (ref 0.0–0.5)
Eosinophils Relative: 2 %
HCT: 45.9 % (ref 36.0–46.0)
Hemoglobin: 14.6 g/dL (ref 12.0–15.0)
Immature Granulocytes: 0 %
Lymphocytes Relative: 26 %
Lymphs Abs: 1.9 10*3/uL (ref 0.7–4.0)
MCH: 26.3 pg (ref 26.0–34.0)
MCHC: 31.8 g/dL (ref 30.0–36.0)
MCV: 82.7 fL (ref 80.0–100.0)
Monocytes Absolute: 0.6 10*3/uL (ref 0.1–1.0)
Monocytes Relative: 8 %
Neutro Abs: 4.5 10*3/uL (ref 1.7–7.7)
Neutrophils Relative %: 64 %
Platelets: 294 10*3/uL (ref 150–400)
RBC: 5.55 MIL/uL — ABNORMAL HIGH (ref 3.87–5.11)
RDW: 14.8 % (ref 11.5–15.5)
WBC: 7.1 10*3/uL (ref 4.0–10.5)
nRBC: 0 % (ref 0.0–0.2)

## 2021-11-23 MED ORDER — CARVEDILOL 12.5 MG PO TABS
12.5000 mg | ORAL_TABLET | Freq: Two times a day (BID) | ORAL | Status: DC
  Administered 2021-11-23: 12.5 mg via ORAL
  Filled 2021-11-23 (×3): qty 1

## 2021-11-23 MED ORDER — LACTATED RINGERS IV SOLN
INTRAVENOUS | Status: AC
Start: 2021-11-23 — End: 2021-11-23

## 2021-11-23 MED ORDER — CARVEDILOL 12.5 MG PO TABS
12.5000 mg | ORAL_TABLET | Freq: Two times a day (BID) | ORAL | Status: DC
Start: 2021-11-23 — End: 2021-11-23

## 2021-11-23 MED ORDER — CITALOPRAM HYDROBROMIDE 10 MG PO TABS
10.0000 mg | ORAL_TABLET | Freq: Every day | ORAL | Status: DC
  Administered 2021-11-24 – 2021-11-25 (×2): 10 mg via ORAL
  Filled 2021-11-23 (×2): qty 1

## 2021-11-23 MED ORDER — ISOSORBIDE MONONITRATE ER 30 MG PO TB24
30.0000 mg | ORAL_TABLET | Freq: Every day | ORAL | Status: DC
  Administered 2021-11-24 – 2021-11-25 (×2): 30 mg via ORAL
  Filled 2021-11-23 (×2): qty 1

## 2021-11-23 NOTE — Progress Notes (Signed)
?                                  PROGRESS NOTE                                             ?                                                                                                                     ?                                         ? ? Patient Demographics:  ? ? Emily Key, is a 51 y.o. female, DOB - [birth year], NKN:[id number] ? ?Outpatient Primary MD for the patient is Vevelyn Francois, NP    LOS - 2  Admit date - 11/19/2021   ? ?Chief Complaint  ?Patient presents with  ? Chest Pain  ?    ? ?Brief Narrative (HPI from H&P)  51 y.o. 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  She also has some dysuria for the last 24 hours. ? ?She was admitted to the hospital for chest pain, acute on chronic CHF, subsequently developed some abdominal discomfort as well. ? ? Subjective:  ? ?Patient in bed, appears comfortable, denies any headache, no fever, no chest pain or pressure, no shortness of breath , no abdominal pain. No new focal weakness. ? ? Assessment  & Plan :  ? ? ?1.  Atypical chest pain with hypertensive urgency/crisis.  Resolved likely due to hypertensive crisis.  Cardiology on board.  Echo noted essentially unchanged. ?  ?2.  Hypertensive urgency now hypotensive on one fourth of her home dose blood pressure medications.  She now claims that she is not compliant with her blood pressure medications and often misses the night doses, currently only 12.5 of Coreg which will be started from this evening, no  blood pressure medications this morning, Imdur from tomorrow, all other home blood pressure medications on hold due to hypotension.  She has been counseled on compliance.  Her home medication dosages have to be adjusted prior to discharge based on her blood pressure for the next few days. ?  ?3.  Anxiety and depression.  Continue Celexa at home dose. ?  ?4.  Dysuria.  Resolved.  UA stable. ?  ?5.  Acute on chronic combined systolic and diastolic CHF EF 15%.  Plan as in #1 above, responded very well to single dose IV Lasix given on 11/19/2021.  [place] of breath has resolved and lungs are clear, gentle IV fluids on 11/23/2021 for hypotension and AKI. ? ?6.  Epigastric pain.  She states that she has been having some epigastric discomfort intermittently for several days, some nausea.  KUB stable, given 2 high doses of Maalox along with twice daily PPI, pain much improved after that, GI on board. ? ?7.  Asymptomatic 7 beat run of NSVT on 11/20/2021 6 AM.  Already on max dose beta-blocker, EF is known to be around 20%, 1 g mag given, will keep potassium around 4 and give 2 runs of IV potassium on 11/20/2021.  Cardiology on board.  Continue telemetry monitoring. ? ?8.  AKI.  Caused by relative hypoperfusion due to low perfusion pressures, blood pressure medications held as in #2 above, gentle IV fluids on 11/23/2021 and monitor.  Discontinued Entresto, Norvasc & hydralazine. Coreg cut in half from tonight, skip Imdur today.  This morning no blood pressure medications. ? ?   ? ?Condition -  Guarded ? ?Family Communication  :  none present ? ?Code Status :  Full ? ?Consults  :  Cards, GI ? ?PUD Prophylaxis : PPI ? ? Procedures  :    ? ?TTE - 1. Left ventricular ejection fraction, by estimation, is 20 to 25%. The left ventricle has severely decreased function. The left ventricle demonstrates global hypokinesis. There is mild left ventricular hypertrophy. Left ventricular diastolic parameters  are indeterminate.  2. Right  ventricular systolic function is normal. The right ventricular size is normal.  3. The pericardial effusion is circumferential.  4. The mitral valve is abnormal. Mild mitral valve regurgitation. No evidence of mitral stenosis.  5. The aortic valve is tricuspid. Aortic valve regurgitation is not visualized. No aortic stenosis is present.  6. IVC is small suggesting low RA pressure and hypovolemia ? ?CTA - No evidence of thoracic or abdominal aortic dissection or aneurysm. Bilateral nonobstructive nephrolithiasis. Mild cardiomegaly. ? ?   ? ?Disposition Plan  :   ? ?Status is: Observation ? ?DVT Prophylaxis  :   ? ?enoxaparin (LOVENOX) injection 40 mg Start: 11/19/21 2000 ? ? ?Lab Results  ?Component Value Date  ? PLT 294 11/23/2021  ? ? ?Diet :  ?Diet Order   ? ?       ?  DIET SOFT Room service appropriate? Yes; Fluid consistency: Thin  Diet effective now       ?  ? ?  ?  ? ?  ?  ? ?Inpatient Medications ? ?Scheduled Meds: ? carvedilol  12.5 mg Oral BID WC  ? citalopram  20 mg Oral Daily  ? dapagliflozin propanediol  10 mg Oral Daily  ? enoxaparin (LOVENOX) injection  40 mg Subcutaneous Q24H  ? feeding supplement  1 Container Oral TID BM  ? [START ON 11/24/2021] isosorbide mononitrate  30 mg Oral Daily  ? multivitamin with minerals  1 tablet Oral Daily  ? pantoprazole  40 mg Oral BID  ? ?Continuous Infusions: ? lactated ringers 100 mL/hr at 11/23/21 0727  ? promethazine (PHENERGAN) injection (IM or IVPB) Stopped (11/20/21 1448)  ? ?PRN Meds:.acetaminophen **OR** acetaminophen, albuterol, alum & mag hydroxide-simeth, bisacodyl, cloNIDine, hydrALAZINE, promethazine (PHENERGAN) injection (IM or IVPB) ? ?Antibiotics  :   ? ?Anti-infectives (From admission, onward)  ? ?  None  ? ?  ? ? ? Time Spent in minutes  30 ? ? ?Lala Lund M.D on 11/23/2021 at 9:53 AM ? ?To page go to [email or web address]  ? ?[facility] -  Office  [phone] ? ?See all Orders from today for further details ? ? ? Objective:  ? ?Vitals:  ?  11/22/21 0558 11/22/21 2019 11/23/21 0450 11/23/21 0800  ?BP: 126/78 (!) 94/58 116/72 134/73  ?Pulse: (!) 58 64 (!) 57 65  ?Resp: '17 16 15 14  '$ ?Temp: 98.1 ?F (36.7 ?C) 98.2 ?F (36.8 ?C) 98.1 ?F (36.7 ?C) 97.8 ?F (36.6 ?C)  ?TempSrc: Oral Oral Oral Oral  ?SpO2: 99% 100% 100% 100%  ?Weight: 74 kg  75.8 kg   ?Height:      ? ? ?Wt Readings from Last 3 Encounters:  ?11/23/21 75.8 kg  ?03/29/21 77.1 kg  ?03/02/21 80.2 kg  ? ? ? ?Intake/Output Summary (Last 24 hours) at 11/23/2021 0953 ?Last data filed at 11/23/2021 0800 ?Gross per 24 hour  ?Intake 240 ml  ?Output 725 ml  ?Net -485 ml  ? ? ? ?Physical Exam ? ?Awake Alert, No new F.N deficits, Normal affect ?[place].AT,PERRAL ?Supple Neck, No JVD,   ?Symmetrical Chest wall movement, Good air movement bilaterally, CTAB ?RRR,No Gallops, Rubs or new Murmurs,  ?+ve B.Sounds, Abd Soft, No tenderness,   ?No Cyanosis, Clubbing or edema  ? ? ? ? Data Review:  ? ? ?CBC ?Recent Labs  ?Lab 11/19/21 ?1100 11/20/21 ?3235 11/21/21 ?0251 11/22/21 ?0110 11/23/21 ?0417  ?WBC 7.2 9.1 15.9* 11.6* 7.1  ?HGB 13.2 16.4* 16.5* 15.3* 14.6  ?HCT 40.6 49.1* 48.5* 47.9* 45.9  ?PLT 282 324 338 335 294  ?MCV 81.4 79.3* 80.0 82.0 82.7  ?MCH 26.5 26.5 27.2 26.2 26.3  ?MCHC 32.5 33.4 34.0 31.9 31.8  ?RDW 14.6 14.7 14.9 15.0 14.8  ?LYMPHSABS  --   --  1.8 2.0 1.9  ?MONOABS  --   --  0.7 0.6 0.6  ?EOSABS  --   --  0.0 0.1 0.1  ?BASOSABS  --   --  0.0 0.0 0.0  ? ? ?Electrolytes ?Recent Labs  ?Lab 11/19/21 ?1100 11/19/21 ?1219 11/20/21 ?5732 11/20/21 ?2025 11/21/21 ?0251 11/22/21 ?0110 11/23/21 ?0417  ?NA 141  --   --  134* 134* 135 133*  ?K 3.6  --   --  3.6 3.9 4.2 4.2  ?CL 107  --   --  96* 96* 97* 98  ?CO2 23  --   --  '23 25 29 27  '$ ?GLUCOSE 140*  --   --  201* 144* 109* 119*  ?BUN 17  --   --  31* 47* 48* 47*  ?CREATININE 1.03*  --   --  1.67* 1.89* 1.57* 1.68*  ?CALCIUM 9.3  --   --  9.3 9.1 9.2 8.9  ?AST  --  22  --  '19 16 16 17  '$ ?ALT  --  21  --  '20 16 15 13  '$ ?ALKPHOS  --  62  --  68 59 54 49  ?BILITOT  --   0.9  --  1.2 0.9 0.7 0.7  ?ALBUMIN  --  3.6  --  3.9 3.6 3.4* 3.3*  ?MG  --   --   --  2.7* 2.8* 2.9*  --   ?TSH  --  0.540  --   --   --   --   --   ?HGBA1C  --   --  6.1*  --   --   --   --   ?BNP  --  1,619.6*  --  4

## 2021-11-23 NOTE — Progress Notes (Addendum)
? ?Progress Note ? ?Patient Name: Emily Key ?Date of Encounter: 11/23/2021 ? ?[facility] Cardiologist: Glori Bickers, MD  ? ?Subjective  ? ?No cardiac complaints this morning. No further N/V, continues to have some epigastric discomfort. Running LR at 100 ml/hr ? ?Inpatient Medications  ?  ?Scheduled Meds: ? carvedilol  12.5 mg Oral BID WC  ? citalopram  20 mg Oral Daily  ? dapagliflozin propanediol  10 mg Oral Daily  ? enoxaparin (LOVENOX) injection  40 mg Subcutaneous Q24H  ? feeding supplement  1 Container Oral TID BM  ? [START ON 11/24/2021] isosorbide mononitrate  30 mg Oral Daily  ? multivitamin with minerals  1 tablet Oral Daily  ? pantoprazole  40 mg Oral BID  ? ?Continuous Infusions: ? lactated ringers 100 mL/hr at 11/23/21 0727  ? promethazine (PHENERGAN) injection (IM or IVPB) Stopped (11/20/21 1448)  ? ?PRN Meds: ?acetaminophen **OR** acetaminophen, albuterol, alum & mag hydroxide-simeth, bisacodyl, cloNIDine, hydrALAZINE, promethazine (PHENERGAN) injection (IM or IVPB)  ? ?Vital Signs  ?  ?Vitals:  ? 11/22/21 0055 11/22/21 0558 11/22/21 2019 11/23/21 0450  ?BP: 114/79 126/78 (!) 94/58 116/72  ?Pulse:  (!) 58 64 (!) 57  ?Resp:  '17 16 15  '$ ?Temp:  98.1 ?F (36.7 ?C) 98.2 ?F (36.8 ?C) 98.1 ?F (36.7 ?C)  ?TempSrc:  Oral Oral Oral  ?SpO2:  99% 100% 100%  ?Weight:  74 kg  75.8 kg  ?Height:      ? ? ?Intake/Output Summary (Last 24 hours) at 11/23/2021 0748 ?Last data filed at 11/23/2021 0450 ?Gross per 24 hour  ?Intake 240 ml  ?Output 425 ml  ?Net -185 ml  ? ? ?  11/23/2021  ?  4:50 AM 11/22/2021  ?  5:58 AM 11/21/2021  ?  5:00 AM  ?Last 3 Weights  ?Weight (lbs) 167 lb 1.6 oz 163 lb 3.2 oz 169 lb 8.5 oz  ?Weight (kg) 75.796 kg 74.027 kg 76.9 kg  ?   ? ?Telemetry  ?  ?Sinus bradycardia with HR 50s - Personally Reviewed ? ?ECG  ?  ?No new tracings - Personally Reviewed ? ?Physical Exam  ? ?GEN: No acute distress.   ?Neck: No JVD ?Cardiac: RRR, no murmurs, rubs, or gallops.  ?Respiratory: Clear to auscultation  bilaterally. ?GI: Soft, nontender, non-distended  ?MS: No edema; No deformity. ?Neuro:  Nonfocal  ?Psych: Normal affect  ? ?Labs  ?  ?High Sensitivity Troponin:   ?Recent Labs  ?Lab 11/19/21 ?1100 11/19/21 ?1520  ?TROPONINIHS 32* 21*  ?   ?Chemistry ?Recent Labs  ?Lab 11/20/21 ?0973 11/21/21 ?0251 11/22/21 ?0110 11/23/21 ?0417  ?NA 134* 134* 135 133*  ?K 3.6 3.9 4.2 4.2  ?CL 96* 96* 97* 98  ?CO2 '23 25 29 27  '$ ?GLUCOSE 201* 144* 109* 119*  ?BUN 31* 47* 48* 47*  ?CREATININE 1.67* 1.89* 1.57* 1.68*  ?CALCIUM 9.3 9.1 9.2 8.9  ?MG 2.7* 2.8* 2.9*  --   ?PROT 7.7 7.1 7.0 6.6  ?ALBUMIN 3.9 3.6 3.4* 3.3*  ?AST '19 16 16 17  '$ ?ALT '20 16 15 13  '$ ?ALKPHOS 68 59 54 49  ?BILITOT 1.2 0.9 0.7 0.7  ?GFRNONAA 37* 32* 39* 36*  ?ANIONGAP '15 13 9 8  '$ ?  ?Lipids No results for input(s): CHOL, TRIG, HDL, LABVLDL, LDLCALC, CHOLHDL in the last 168 hours.  ?Hematology ?Recent Labs  ?Lab 11/21/21 ?0251 11/22/21 ?0110 11/23/21 ?0417  ?WBC 15.9* 11.6* 7.1  ?RBC 6.06* 5.84* 5.55*  ?HGB 16.5* 15.3* 14.6  ?HCT 48.5*  47.9* 45.9  ?MCV 80.0 82.0 82.7  ?MCH 27.2 26.2 26.3  ?MCHC 34.0 31.9 31.8  ?RDW 14.9 15.0 14.8  ?PLT 338 335 294  ? ?Thyroid  ?Recent Labs  ?Lab 11/19/21 ?1219  ?TSH 0.540  ?  ?BNP ?Recent Labs  ?Lab 11/20/21 ?4431 11/21/21 ?0251 11/22/21 ?0110  ?BNP 482.7* 99.4 36.8  ?  ?DDimer No results for input(s): DDIMER in the last 168 hours.  ? ?Radiology  ?  ?No results found. ? ?Cardiac Studies  ? ?Echocardiogram 11/20/2021: ?Impressions: ?1. Left ventricular ejection fraction, by estimation, is 20 to 25%. The  ?left ventricle has severely decreased function. The left ventricle  ?demonstrates global hypokinesis. There is mild left ventricular  ?hypertrophy. Left ventricular diastolic parameters  ? are indeterminate.  ? 2. Right ventricular systolic function is normal. The right ventricular  ?size is normal.  ? 3. The pericardial effusion is circumferential.  ? 4. The mitral valve is abnormal. Mild mitral valve regurgitation. No  ?evidence of  mitral stenosis.  ? 5. The aortic valve is tricuspid. Aortic valve regurgitation is not  ?visualized. No aortic stenosis is present.  ? 6. IVC is small suggesting low RA pressure and hypovolemia.  ? ?Patient Profile  ?   ?51 y.o. 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  ? ?Assessment & Plan  ?  ?Hypertensive urgency ?- no RAS on [place] 12/2020 ?- was 198/131 on admission ?- question medication noncompliance at home ?- PTA: 5 mg amlodipine, 97-103 entresto, 25 mg coreg, 25 mg spironolactone, 100 mg hydralazine TID, 30 mg imdur ?- entrseto, amlodipine, and spiro on hold for AKI and hypotension ?- hydralazine restarted at 50 mg TID with coreg and imdur continued ?- SBP now 90-110s - hydralazine was stopped yesterday for borderline BP, coreg reduced to 12.5 mg BID ?- continue coreg and amlodipine ?- will likely restart/titrate medications at clinic follow up, pending renal function ?- will need to educate on BP log ? ? ?AKI ?- sCr 1.68 ?- baseline 0.9 in 02/2021 - question progression and possibly now CKD given uncontrolled hypertension ?- cautious with IVF given EF 20% - running LR at 100 ml/hr ? ? ?NICM ?Chronic systolic heart failure ?- LVEF 20-25%, felt related to uncontrolled hypertension ?- GDMT: coreg, imdur, and farxiga ?- continue to hold entresto, spiro, and amlodipine, hydralazine D/C'ed ?- was given IV lasix with worsening renal function, improved with gentle re-hydration, now net positive but continues ? ? ?Mild CAD ?Elevated troponin ?- mild and flat --> demand ischemia in the setting of hypertensive urgency ?- reassuring heart cath 12/2020 ?- GI  following for epigastric pain and N/V ? ? ?I will arrange cardiology follow up.    ? ? ?For questions or updates, please contact [place] ?Please consult [email or web address] for contact info under  ? ?  ?   ?Signed, ?Ledora Bottcher, PA  ?11/23/2021, 7:48 AM   ? ? ?Patient seen and examined.  Agree with above documentation.  On exam, patient is alert and oriented, regular rate and rhythm, no murmurs, lungs CTAB, no LE edema or JVD.  BP controlled on just Coreg and imdur.  Appears euvolemic on exam.  Mild worsening in renal function.  IV fluids ordered  this morning, would be cautious with fluids given severe heart failure. ? ?Donato Heinz, MD ? ? ?

## 2021-11-24 DIAGNOSIS — I10 Essential (primary) hypertension: Secondary | ICD-10-CM

## 2021-11-24 LAB — CBC WITH DIFFERENTIAL/PLATELET
Abs Immature Granulocytes: 0.03 10*3/uL (ref 0.00–0.07)
Basophils Absolute: 0 10*3/uL (ref 0.0–0.1)
Basophils Relative: 0 %
Eosinophils Absolute: 0.1 10*3/uL (ref 0.0–0.5)
Eosinophils Relative: 3 %
HCT: 45.3 % (ref 36.0–46.0)
Hemoglobin: 14.6 g/dL (ref 12.0–15.0)
Immature Granulocytes: 1 %
Lymphocytes Relative: 43 %
Lymphs Abs: 2.5 10*3/uL (ref 0.7–4.0)
MCH: 26.9 pg (ref 26.0–34.0)
MCHC: 32.2 g/dL (ref 30.0–36.0)
MCV: 83.4 fL (ref 80.0–100.0)
Monocytes Absolute: 0.6 10*3/uL (ref 0.1–1.0)
Monocytes Relative: 11 %
Neutro Abs: 2.4 10*3/uL (ref 1.7–7.7)
Neutrophils Relative %: 42 %
Platelets: 267 10*3/uL (ref 150–400)
RBC: 5.43 MIL/uL — ABNORMAL HIGH (ref 3.87–5.11)
RDW: 14.7 % (ref 11.5–15.5)
WBC: 5.6 10*3/uL (ref 4.0–10.5)
nRBC: 0 % (ref 0.0–0.2)

## 2021-11-24 LAB — COMPREHENSIVE METABOLIC PANEL
ALT: 13 U/L (ref 0–44)
AST: 18 U/L (ref 15–41)
Albumin: 3.3 g/dL — ABNORMAL LOW (ref 3.5–5.0)
Alkaline Phosphatase: 45 U/L (ref 38–126)
Anion gap: 7 (ref 5–15)
BUN: 34 mg/dL — ABNORMAL HIGH (ref 6–20)
CO2: 30 mmol/L (ref 22–32)
Calcium: 9.1 mg/dL (ref 8.9–10.3)
Chloride: 100 mmol/L (ref 98–111)
Creatinine, Ser: 1.36 mg/dL — ABNORMAL HIGH (ref 0.44–1.00)
GFR, Estimated: 47 mL/min — ABNORMAL LOW (ref >60)
Glucose, Bld: 108 mg/dL — ABNORMAL HIGH (ref 70–99)
Potassium: 4 mmol/L (ref 3.5–5.1)
Sodium: 137 mmol/L (ref 135–145)
Total Bilirubin: 0.7 mg/dL (ref 0.3–1.2)
Total Protein: 6.5 g/dL (ref 6.5–8.1)

## 2021-11-24 MED ORDER — CARVEDILOL 6.25 MG PO TABS
6.2500 mg | ORAL_TABLET | Freq: Two times a day (BID) | ORAL | Status: DC
  Administered 2021-11-24 – 2021-11-25 (×3): 6.25 mg via ORAL
  Filled 2021-11-24 (×3): qty 1

## 2021-11-24 MED ORDER — HYDRALAZINE HCL 50 MG PO TABS: 50.0000 mg | ORAL_TABLET | Freq: Three times a day (TID) | ORAL | Status: DC

## 2021-11-24 MED ORDER — HYDRALAZINE HCL 25 MG PO TABS
25.0000 mg | ORAL_TABLET | Freq: Three times a day (TID) | ORAL | Status: DC
  Administered 2021-11-24 – 2021-11-25 (×4): 25 mg via ORAL
  Filled 2021-11-24 (×4): qty 1

## 2021-11-24 NOTE — Care Management (Addendum)
11-24-21 1013 Case Manager spoke with the patient and she is in need of a PCP. Patient has PCP FNP Dionisio David listed in Epic at the Patient [place]. Case Manager did attempt to call the [name] and unable to get through to the office. Patient is aware that she will need to call the office to schedule an appointment. No further needs identified at this time.  ?

## 2021-11-24 NOTE — Progress Notes (Signed)
Physical Therapy Treatment ?Patient Details ?Name: Emily Key ?MRN: [id number] ?DOB: 05-14-1971 ?Today's Date: 11/24/2021 ? ? ?History of Present Illness The pt is a 51 yo female presenting 5/12 with chest pain that radiates to R and L side x 1 week with SOB and nausea. Pt found to have HTN emergency and acute CHF exacerbation. PMH includes: CKD, poorly controlled HTN, NSTEMI, and CHF. ? ?  ?PT Comments  ? ? The pt was able to demo good progress with both endurance and stability this session, completing hallway ambulation with HHA initially, but then progressed to no UE support. She does continue to present with mild instability, intermittently needing minA to steady, especially with addition of balance challenges such as quick changes in direction, head turns, and sudden stops. The pt also completed 5x sit-stand without needing to use UE support, but demos deficits in LE power and endurance with this test. Will continue to benefit from skilled PT acutely to progress balance, endurance, and facilitate return to prior level of independence. Pt denies any change in pain or sx with activity.  ? ?Gait Speed: 0.58ms (Gait speed <0.621m indicates increased risk of falls and dependence in ADLs) ? ?5X Sit-to-Stand: 14.3 sec (> 12.6 sec indicates increased risk of falls for individuals aged 53-79> 15 sec indicates increased risk of recurrent falls) ?  ?Recommendations for follow up therapy are one component of a multi-disciplinary discharge planning process, led by the attending physician.  Recommendations may be updated based on patient status, additional functional criteria and insurance authorization. ? ?Follow Up Recommendations ? Home health PT ?  ?  ?Assistance Recommended at Discharge Frequent or constant Supervision/Assistance  ?Patient can return home with the following A little help with walking and/or transfers;A little help with bathing/dressing/bathroom;Assistance with cooking/housework;Assist for  transportation;Help with stairs or ramp for entrance ?  ?Equipment Recommendations ? Other (comment) (shower seat)  ?  ?Recommendations for Other Services   ? ? ?  ?Precautions / Restrictions Precautions ?Precautions: Fall ?Precaution Comments: watch BP ?Restrictions ?Weight Bearing Restrictions: No  ?  ? ?Mobility ? Bed Mobility ?Overal bed mobility: Independent ?  ?  ?  ?  ?  ?  ?General bed mobility comments: pt completing without increased time or cues ?  ? ?Transfers ?Overall transfer level: Needs assistance ?Equipment used: None ?Transfers: Sit to/from Stand ?Sit to Stand: Min guard ?  ?  ?  ?  ?  ?General transfer comment: no instability noted with sit-stand ?  ? ?Ambulation/Gait ?Ambulation/Gait assistance: Min guard ?Gait Distance (Feet): 250 Feet ?Assistive device: None, 1 person hand held assist ?Gait Pattern/deviations: Step-through pattern, Decreased stride length, Drifts right/left, Narrow base of support, Scissoring ?Gait velocity: 0.56 m/s ?Gait velocity interpretation: 1.31 - 2.62 ft/sec, indicative of limited community ambulator ?  ?General Gait Details: pt reports LLE feels "wobbly" but no numbness or instability, intermittent scissoring with RLE, no overt LOB even with balance challenges. minA initially with HHA, then minG without any UE support or assist ? ? ?  ?Balance Overall balance assessment: Needs assistance ?Sitting-balance support: Feet supported, No upper extremity supported ?Sitting balance-Leahy Scale: Good ?  ?  ?Standing balance support: No upper extremity supported, Single extremity supported, During functional activity ?Standing balance-Leahy Scale: Fair ?Standing balance comment: pt initially ambulating with minA through HHA, then no UE support with mild instability noted, but no overt LOB ?  ?  ?  ?  ?  ?  ?High level balance activites: Backward walking, Direction changes, Sudden  stops, Head turns ?High Level Balance Comments: minG with no overt LOB, slows signifiantly and demos  greater instability with challenge ?Standardized Balance Assessment ?Standardized Balance Assessment : Dynamic Gait Index ?  ?Dynamic Gait Index ?Level Surface: Mild Impairment ?Change in Gait Speed: Mild Impairment ?Gait with Horizontal Head Turns: Moderate Impairment ?Gait with Vertical Head Turns: Moderate Impairment ?Gait and Pivot Turn: Mild Impairment ?Step Around Obstacles: Mild Impairment ?  ? ?  ?Cognition Arousal/Alertness: Awake/alert ?Behavior During Therapy: Flat affect ?Overall Cognitive Status: No family/caregiver present to determine baseline cognitive functioning ?Area of Impairment: Safety/judgement, Problem solving ?  ?  ?  ?  ?  ?  ?  ?  ?  ?  ?  ?  ?Safety/Judgement: Decreased awareness of deficits ?  ?Problem Solving: Slow processing, Decreased initiation, Difficulty sequencing ?General Comments: pt with flat affect and slow to respond but able to follow cues. ?  ?  ? ?  ?Exercises Other Exercises ?Other Exercises: 5x sit-stand in 14.3 sec without use of UE ? ?  ?General Comments General comments (skin integrity, edema, etc.): VSS on RA ?  ?  ? ?Pertinent Vitals/Pain Pain Assessment ?Pain Assessment: No/denies pain ?Pain Intervention(s): Limited activity within patient's tolerance, Monitored during session  ? ? ? ?PT Goals (current goals can now be found in the care plan section) Acute Rehab PT Goals ?Patient Stated Goal: to get some rest ?PT Goal Formulation: With patient ?Time For Goal Achievement: 12/04/21 ?Potential to Achieve Goals: Good ?Progress towards PT goals: Progressing toward goals ? ?  ?Frequency ? ? ? Min 3X/week ? ? ? ?  ?PT Plan Current plan remains appropriate  ? ? ?   ?AM-PAC PT "6 Clicks" Mobility   ?Outcome Measure ? Help needed turning from your back to your side while in a flat bed without using bedrails?: A Little ?Help needed moving from lying on your back to sitting on the side of a flat bed without using bedrails?: A Little ?Help needed moving to and from a bed to a  chair (including a wheelchair)?: A Little ?Help needed standing up from a chair using your arms (e.g., wheelchair or bedside chair)?: A Little ?Help needed to walk in hospital room?: A Little ?Help needed climbing 3-5 steps with a railing? : A Little ?6 Click Score: 18 ? ?  ?End of Session Equipment Utilized During Treatment: Gait belt ?Activity Tolerance: Patient tolerated treatment well ?Patient left: in chair;with call bell/phone within reach ?Nurse Communication: Mobility status ?PT Visit Diagnosis: Unsteadiness on feet (R26.81);Muscle weakness (generalized) (M62.81) ?  ? ? ?Time: 0940-1003 ?PT Time Calculation (min) (ACUTE ONLY): 23 min ? ?Charges:  $Gait Training: 8-22 mins ?$Therapeutic Exercise: 8-22 mins          ?          ? ?[place] Carbo, PT, DPT  ? ?Acute Rehabilitation Department ?Pager #: [phone] - [phone] ? ? ?Emily Key ?11/24/2021, 10:05 AM ? ?

## 2021-11-24 NOTE — Progress Notes (Signed)
?PROGRESS NOTE ? ? ? ?Emily Key  MGQ:[id number] DOB: Mar 26, 1971 DOA: 11/19/2021 ?PCP: Vevelyn Francois, NP ? ? ?Brief Narrative:  ?51 y.o. 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  She also has some dysuria for the last 24 hours. She was admitted to the hospital for chest pain, acute on chronic CHF, subsequently developed some abdominal discomfort as well. ? ?Assessment & Plan: ?  ?Principal Problem: ?  Hypertensive urgency ?Active Problems: ?  Hypertension ?  Acute on chronic systolic CHF (congestive heart failure) ([place]) ?  CHF (congestive heart failure) ([place]) ?  AKI (acute kidney injury) ([place]) ?  Abdominal pain, epigastric ? ? ?Atypical chest pain secondary to hypertensive emergency.  ?Resolved, see below; cardiology following ?  ?Hypertensive emergency ?-Secondary to noncompliance  ?-Cardiology following, appreciate insight and recommendations  ?-Continue to titrate home medications, currently on carvedilol hydralazine, isosorbide; as needed clonidine -somewhat labile over the past 24 hours, previously bradycardic with symptomatic exertion ? ?Anxiety and depression.  Continue Celexa at home dose. ?  ?Acute on chronic combined systolic and diastolic CHF EF 26%.  Plan as in #1 above, responded very well to single dose IV Lasix given on 11/19/2021.  [place] of breath has resolved and lungs are clear, gentle IV fluids on 11/23/2021 for hypotension and AKI. ?  ?Dysuria.  Resolved. UA unremarkable ? ?Epigastric pain.  Resolved with increased PPI/maalox  regimen ?  ?AKI, resolving.   ?Improving with IV fluid - previously discontinued Entresto, Norvasc & hydralazine. ? ?DVT prophylaxis: Lovenox ?Code Status: Full ?Family Communication: None present ? ?Status is: Inpatient ? ?Dispo: The patient is from: Home ?             Anticipated d/c is to: Home ?             Anticipated d/c date is: 24 hours ?             Patient currently not medically stable for discharge ? ?Consultants:  ?Cardiology ? ?Procedures:  ?None ? ?Antimicrobials:  ?None ? ?Subjective: ?No acute issues or events overnight denies nausea vomiting diarrhea constipation headache fevers chills or chest pain ? ?Objective: ?Vitals:  ? 11/23/21 0800 11/23/21 1435 11/23/21 2103 11/24/21 0414  ?BP: 134/73 (!) 141/76 131/82 (!) 162/94  ?Pulse: 65 60 75 60  ?Resp: '14 16 19 20  '$ ?Temp: 97.8 ?F (36.6 ?C) 98.3 ?F (36.8 ?C) 97.7 ?F (36.5 ?C) 98.3 ?F (36.8 ?C)  ?TempSrc: Oral Oral Oral Oral  ?SpO2: 100% 98% 98% 98%  ?Weight:    76.2 kg  ?Height:      ? ? ?Intake/Output Summary (Last 24 hours) at 11/24/2021 0709 ?Last data filed at 11/24/2021 0415 ?Gross per 24 hour  ?Intake 240 ml  ?Output 1050 ml  ?Net -810 ml  ? ?Filed Weights  ? 11/22/21 0558 11/23/21 0450 11/24/21 0414  ?Weight: 74 kg 75.8 kg 76.2 kg  ? ? ?Examination: ? ?General exam: Appears calm and comfortable  ?Respiratory system: Clear to auscultation. Respiratory effort normal. ?Cardiovascular system: S1 & S2 heard, RRR. No JVD, murmurs, rubs, gallops or clicks.  No pedal edema. ?Gastrointestinal system: Abdomen is nondistended, soft and nontender. No organomegaly or masses felt. Normal bowel sounds heard. ?Central nervous system: Alert and oriented. No focal neurological deficits. ?Extremities: Symmetric 5 x 5 power. ?Skin: No rashes, lesions or ulcers ? ?Data Reviewed: I have personally reviewed following labs and imaging studies ? ?CBC: ?Recent Labs  ?Lab 11/20/21 ?8250 11/21/21 ?0251 11/22/21 ?0110 11/23/21 ?5397 11/24/21 ?6734  ?WBC 9.1 15.9* 11.6* 7.1 5.6   ?NEUTROABS  --  13.4* 8.8* 4.5 2.4  ?HGB 16.4* 16.5* 15.3* 14.6 14.6  ?HCT 49.1* 48.5* 47.9* 45.9 45.3  ?MCV 79.3* 80.0 82.0 82.7 83.4  ?PLT 324 338 335 294 267  ? ?Basic Metabolic Panel: ?Recent Labs  ?Lab 11/20/21 ?1937 11/21/21 ?0251 11/22/21 ?0110 11/23/21 ?9024 11/24/21 ?0973  ?NA 134* 134* 135 133* 137  ?K 3.6 3.9 4.2 4.2 4.0  ?CL 96* 96* 97* 98 100  ?CO2 '23 25 29 27 30  '$ ?GLUCOSE 201* 144* 109* 119* 108*  ?BUN 31* 47* 48* 47* 34*  ?CREATININE 1.67* 1.89* 1.57* 1.68* 1.36*  ?CALCIUM 9.3 9.1 9.2 8.9 9.1  ?MG 2.7* 2.8* 2.9*  --   --   ? ?GFR: ?Estimated Creatinine Clearance: 51.5 mL/min (A) (by C-G formula based on SCr of 1.36 mg/dL (H)). ?Liver Function Tests: ?Recent Labs  ?Lab 11/20/21 ?5329 11/21/21 ?0251 11/22/21 ?0110 11/23/21 ?9242 11/24/21 ?6834  ?AST '19 16 16 17 18  '$ ?ALT '20 16 15 13 13  '$ ?ALKPHOS 68 59 54 49 45  ?BILITOT 1.2 0.9 0.7 0.7 0.7  ?PROT 7.7 7.1 7.0 6.6 6.5  ?ALBUMIN 3.9 3.6 3.4* 3.3* 3.3*  ? ?Recent Labs  ?Lab 11/19/21 ?1219  ?LIPASE 20  ? ?No results for input(s): AMMONIA in the last 168 hours. ?Coagulation Profile: ?No results for input(s): INR, PROTIME in the last 168 hours. ?Cardiac Enzymes: ?No results for input(s): CKTOTAL, CKMB, CKMBINDEX, TROPONINI in the last 168 hours. ?BNP (last 3 results) ?No results for input(s): PROBNP in the last 8760 hours. ?HbA1C: ?No results for input(s): HGBA1C in the last 72 hours. ?CBG: ?No results for input(s): GLUCAP in the last 168 hours. ?Lipid Profile: ?No results for input(s): CHOL, HDL, LDLCALC, TRIG, CHOLHDL, LDLDIRECT in the last 72 hours. ?Thyroid Function Tests: ?No results for input(s): TSH, T4TOTAL, FREET4, T3FREE, THYROIDAB in the last 72 hours. ?Anemia Panel: ?No results for input(s): VITAMINB12, FOLATE, FERRITIN, TIBC, IRON, RETICCTPCT in the last 72 hours. ?Sepsis Labs: ?No results for input(s): PROCALCITON, LATICACIDVEN in the last 168 hours. ? ?No results found for this or any previous visit (from the past 240 hour(s)).   ? ? ? ? ? ?Radiology Studies: ?[place] EKG SITE RITE ? ?Result Date: 11/23/2021 ?If [employer] not attached, placement could not be confirmed due to current cardiac rhythm.  ? ?Scheduled Meds: ? carvedilol  12.5 mg Oral BID WC  ? citalopram  10 mg Oral Daily  ? dapagliflozin propanediol  10 mg Oral Daily  ? enoxaparin (LOVENOX) injection  40 mg Subcutaneous Q24H  ? feeding supplement  1 Container Oral TID BM  ? isosorbide mononitrate  30 mg Oral Daily  ? multivitamin with minerals  1 tablet Oral Daily  ? pantoprazole  40 mg Oral BID  ? ?Continuous Infusions: ? promethazine (PHENERGAN) injection (IM or IVPB) Stopped (11/20/21 1448)  ? ? LOS: 3 days  ? ?Time spent: 48mn ? ?WLittle Ishikawa DO ?[facility] ? ?If 7PM-7AM, please contact night-coverage ?[email or web address] ? ?11/24/2021, 7:09 AM   ? ? ? ?

## 2021-11-24 NOTE — Progress Notes (Addendum)
? ?Progress Note ? ?Patient Name: Emily Key ?Date of Encounter: 11/24/2021 ? ?[facility] Cardiologist: Glori Bickers, MD  ? ?Subjective  ? ?No complaints. She tolerated LR well, renal function improved.  ? ?Inpatient Medications  ?  ?Scheduled Meds: ? carvedilol  12.5 mg Oral BID WC  ? citalopram  10 mg Oral Daily  ? dapagliflozin propanediol  10 mg Oral Daily  ? enoxaparin (LOVENOX) injection  40 mg Subcutaneous Q24H  ? feeding supplement  1 Container Oral TID BM  ? isosorbide mononitrate  30 mg Oral Daily  ? multivitamin with minerals  1 tablet Oral Daily  ? pantoprazole  40 mg Oral BID  ? ?Continuous Infusions: ? promethazine (PHENERGAN) injection (IM or IVPB) Stopped (11/20/21 1448)  ? ?PRN Meds: ?acetaminophen **OR** acetaminophen, albuterol, alum & mag hydroxide-simeth, bisacodyl, cloNIDine, hydrALAZINE, promethazine (PHENERGAN) injection (IM or IVPB)  ? ?Vital Signs  ?  ?Vitals:  ? 11/23/21 0800 11/23/21 1435 11/23/21 2103 11/24/21 0414  ?BP: 134/73 (!) 141/76 131/82 (!) 162/94  ?Pulse: 65 60 75 60  ?Resp: '14 16 19 20  '$ ?Temp: 97.8 ?F (36.6 ?C) 98.3 ?F (36.8 ?C) 97.7 ?F (36.5 ?C) 98.3 ?F (36.8 ?C)  ?TempSrc: Oral Oral Oral Oral  ?SpO2: 100% 98% 98% 98%  ?Weight:    76.2 kg  ?Height:      ? ? ?Intake/Output Summary (Last 24 hours) at 11/24/2021 0745 ?Last data filed at 11/24/2021 0415 ?Gross per 24 hour  ?Intake 240 ml  ?Output 1050 ml  ?Net -810 ml  ? ? ?  11/24/2021  ?  4:14 AM 11/23/2021  ?  4:50 AM 11/22/2021  ?  5:58 AM  ?Last 3 Weights  ?Weight (lbs) 167 lb 14.4 oz 167 lb 1.6 oz 163 lb 3.2 oz  ?Weight (kg) 76.159 kg 75.796 kg 74.027 kg  ?   ? ?Telemetry  ?  ?Sinus to sinus bradycardia with HR 50-60s - Personally Reviewed ? ?ECG  ?  ?No new tracings - Personally Reviewed ? ?Physical Exam  ? ?GEN: No acute distress.   ?Neck: No JVD ?Cardiac: RRR, no murmurs, rubs, or gallops.  ?Respiratory: Clear to auscultation bilaterally. ?GI: Soft, nontender, non-distended  ?MS: No edema; No deformity. ?Neuro:   Nonfocal  ?Psych: Normal affect  ? ?Labs  ?  ?High Sensitivity Troponin:   ?Recent Labs  ?Lab 11/19/21 ?1100 11/19/21 ?1520  ?TROPONINIHS 32* 21*  ?   ?Chemistry ?Recent Labs  ?Lab 11/20/21 ?0175 11/21/21 ?0251 11/22/21 ?0110 11/23/21 ?1025 11/24/21 ?8527  ?NA 134* 134* 135 133* 137  ?K 3.6 3.9 4.2 4.2 4.0  ?CL 96* 96* 97* 98 100  ?CO2 '23 25 29 27 30  '$ ?GLUCOSE 201* 144* 109* 119* 108*  ?BUN 31* 47* 48* 47* 34*  ?CREATININE 1.67* 1.89* 1.57* 1.68* 1.36*  ?CALCIUM 9.3 9.1 9.2 8.9 9.1  ?MG 2.7* 2.8* 2.9*  --   --   ?PROT 7.7 7.1 7.0 6.6 6.5  ?ALBUMIN 3.9 3.6 3.4* 3.3* 3.3*  ?AST '19 16 16 17 18  '$ ?ALT '20 16 15 13 13  '$ ?ALKPHOS 68 59 54 49 45  ?BILITOT 1.2 0.9 0.7 0.7 0.7  ?GFRNONAA 37* 32* 39* 36* 47*  ?ANIONGAP '15 13 9 8 7  '$ ?  ?Lipids No results for input(s): CHOL, TRIG, HDL, LABVLDL, LDLCALC, CHOLHDL in the last 168 hours.  ?Hematology ?Recent Labs  ?Lab 11/22/21 ?0110 11/23/21 ?0417 11/24/21 ?0342  ?WBC 11.6* 7.1 5.6  ?RBC 5.84* 5.55* 5.43*  ?HGB 15.3* 14.6 14.6  ?  HCT 47.9* 45.9 45.3  ?MCV 82.0 82.7 83.4  ?MCH 26.2 26.3 26.9  ?MCHC 31.9 31.8 32.2  ?RDW 15.0 14.8 14.7  ?PLT 335 294 267  ? ?Thyroid  ?Recent Labs  ?Lab 11/19/21 ?1219  ?TSH 0.540  ?  ?BNP ?Recent Labs  ?Lab 11/20/21 ?2993 11/21/21 ?0251 11/22/21 ?0110  ?BNP 482.7* 99.4 36.8  ?  ?DDimer No results for input(s): DDIMER in the last 168 hours.  ? ?Radiology  ?  ?[place] EKG SITE RITE ? ?Result Date: 11/23/2021 ?If [employer] not attached, placement could not be confirmed due to current cardiac rhythm.  ? ?Cardiac Studies  ? ?Echo 11/20/21: ?1. Left ventricular ejection fraction, by estimation, is 20 to 25%. The  ?left ventricle has severely decreased function. The left ventricle  ?demonstrates global hypokinesis. There is mild left ventricular  ?hypertrophy. Left ventricular diastolic parameters  ? are indeterminate.  ? 2. Right ventricular systolic function is normal. The right ventricular  ?size is normal.  ? 3. The pericardial effusion is circumferential.  ?  4. The mitral valve is abnormal. Mild mitral valve regurgitation. No  ?evidence of mitral stenosis.  ? 5. The aortic valve is tricuspid. Aortic valve regurgitation is not  ?visualized. No aortic stenosis is present.  ? 6. IVC is small suggesting low RA pressure and hypovolemia.  ? ?Patient Profile  ?   ?51 y.o. 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  ? ?Assessment & Plan  ?  ?Hypertensive urgency ?Hypertension ?- no RAS on [place] 12/2020 ?- question medication compliance at home ?- PTA: 5 mg amlodipine, 97-103 entresto, 25 mg coreg, 25 mg spironolactone, 100 mg hydralazine TID, 30 mg imdur ?- SBP rising in the 130-160s - coreg was held yesterday morning for bradycardia in the 50s ?- BP rising with borderline bradycarida, will continue to titrate medications - would add back hydralazine first to allow kidney function to continue to normalize - added 25 mg hydralazine TID ? ? ?Sinus bradycardia ?- coreg held yesterday for HR in the 50s ?- HR now in the 50-60s ?- will reduce coreg dose to 6.25 mg BID ? ? ?NICM ?Chronic systolic heart failure ?- LVEF 20-25%, felt related to uncontrolled hypertension ?- GDMT: imdur and farxiga, will try to add back reduced dose of coreg ?- continue to hold entresto, spiro, and amlodipine, hydralazine D/C'ed ?- was given IV lasix with worsening renal function, improved with gentle re-hydration ?- received LR yesterday, improved sCr ?- add back hydralazine 25 mg TID ?- would benefit from entresto and spironolactone, will continue to let renal function improve, will hopefully add back in the next 1-2 days ? ? ?AKI - improving ?- sCr  1.36 today after LR yesterday ?- will continue to hold entresto and spiro - follow BMP ? ? ?Mild CAD ?Elevated troponin ?- mild and flat --> demand ischemia in the setting of hypertensive urgency ?- reassuring heart cath 12/2020 ?- GI following for epigastric pain and N/V ? ?   ? ?For questions or updates, please contact [place] ?Please consult [email or web address] for contact info under  ? ?  ?   ?Signed, ?Ledora Bottcher, PA  ?11/24/2021, 7:45 AM   ? ?Patient seen and examined.  Agree with above documentation.  On exam, patient is alert and oriented, regular rate and rhythm, no murmurs, lungs CTAB, no LE edema or JVD.  Renal function improving. Reduced coreg dose due to bradycardia.  Added hydralazine. ? ?Donato Heinz, MD ? ? ?

## 2021-11-25 ENCOUNTER — Other Ambulatory Visit (HOSPITAL_COMMUNITY): Payer: Self-pay

## 2021-11-25 ENCOUNTER — Other Ambulatory Visit: Payer: Self-pay

## 2021-11-25 LAB — CBC WITH DIFFERENTIAL/PLATELET
Abs Immature Granulocytes: 0.01 10*3/uL (ref 0.00–0.07)
Basophils Absolute: 0 10*3/uL (ref 0.0–0.1)
Basophils Relative: 0 %
Eosinophils Absolute: 0.2 10*3/uL (ref 0.0–0.5)
Eosinophils Relative: 3 %
HCT: 41.7 % (ref 36.0–46.0)
Hemoglobin: 13.8 g/dL (ref 12.0–15.0)
Immature Granulocytes: 0 %
Lymphocytes Relative: 41 %
Lymphs Abs: 2.5 10*3/uL (ref 0.7–4.0)
MCH: 27.5 pg (ref 26.0–34.0)
MCHC: 33.1 g/dL (ref 30.0–36.0)
MCV: 83.1 fL (ref 80.0–100.0)
Monocytes Absolute: 0.7 10*3/uL (ref 0.1–1.0)
Monocytes Relative: 12 %
Neutro Abs: 2.7 10*3/uL (ref 1.7–7.7)
Neutrophils Relative %: 44 %
Platelets: 265 10*3/uL (ref 150–400)
RBC: 5.02 MIL/uL (ref 3.87–5.11)
RDW: 14.8 % (ref 11.5–15.5)
WBC: 6.1 10*3/uL (ref 4.0–10.5)
nRBC: 0 % (ref 0.0–0.2)

## 2021-11-25 LAB — COMPREHENSIVE METABOLIC PANEL
ALT: 12 U/L (ref 0–44)
AST: 17 U/L (ref 15–41)
Albumin: 3.1 g/dL — ABNORMAL LOW (ref 3.5–5.0)
Alkaline Phosphatase: 42 U/L (ref 38–126)
Anion gap: 4 — ABNORMAL LOW (ref 5–15)
BUN: 26 mg/dL — ABNORMAL HIGH (ref 6–20)
CO2: 32 mmol/L (ref 22–32)
Calcium: 9.3 mg/dL (ref 8.9–10.3)
Chloride: 104 mmol/L (ref 98–111)
Creatinine, Ser: 1.21 mg/dL — ABNORMAL HIGH (ref 0.44–1.00)
GFR, Estimated: 54 mL/min — ABNORMAL LOW (ref >60)
Glucose, Bld: 105 mg/dL — ABNORMAL HIGH (ref 70–99)
Potassium: 4.1 mmol/L (ref 3.5–5.1)
Sodium: 140 mmol/L (ref 135–145)
Total Bilirubin: 0.7 mg/dL (ref 0.3–1.2)
Total Protein: 6.2 g/dL — ABNORMAL LOW (ref 6.5–8.1)

## 2021-11-25 MED ORDER — CARVEDILOL 6.25 MG PO TABS
6.2500 mg | ORAL_TABLET | Freq: Two times a day (BID) | ORAL | 0 refills | Status: DC
  Filled 2021-11-25: qty 60, 30d supply, fill #0

## 2021-11-25 MED ORDER — CITALOPRAM HYDROBROMIDE 10 MG PO TABS
10.0000 mg | ORAL_TABLET | Freq: Every day | ORAL | 0 refills | Status: DC
  Filled 2021-11-25: qty 30, 30d supply, fill #0

## 2021-11-25 MED ORDER — CARVEDILOL 12.5 MG PO TABS: 12.5000 mg | ORAL_TABLET | Freq: Two times a day (BID) | ORAL | Status: DC

## 2021-11-25 MED ORDER — HYDRALAZINE HCL 50 MG PO TABS
50.0000 mg | ORAL_TABLET | Freq: Three times a day (TID) | ORAL | 0 refills | Status: DC
  Filled 2021-11-25: qty 90, 30d supply, fill #0

## 2021-11-25 MED ORDER — CARVEDILOL 12.5 MG PO TABS
12.5000 mg | ORAL_TABLET | Freq: Two times a day (BID) | ORAL | 1 refills | Status: DC
  Filled 2021-11-25: qty 60, 30d supply, fill #0
  Filled 2021-12-20: qty 60, 30d supply, fill #1

## 2021-11-25 MED ORDER — HYDRALAZINE HCL 50 MG PO TABS: 50.0000 mg | ORAL_TABLET | Freq: Three times a day (TID) | ORAL | Status: DC

## 2021-11-25 NOTE — Discharge Instructions (Addendum)
Medication Changes: - DECREASE Coreg to 12.'5mg'$  twice daily. - DECREASE Hydralazine to '50mg'$  three times daily. - STOP Amlodipine, Entresto, and Spironolactone for now. These may be able to be restarted at follow-up visit. - CONTINUE Imdur '30mg'$  daily and Farxiga '10mg'$  daily. - CONTINUE Lasix as needed for weight gain or worsening swelling.

## 2021-11-25 NOTE — Progress Notes (Signed)
Went over discharge paperwork wit patient. All questions answered. PIV removed, all belongings at bedside.

## 2021-11-25 NOTE — Progress Notes (Addendum)
Progress Note  Patient Name: Emily Key Date of Encounter: 11/25/2021  [facility] Cardiologist: Glori Bickers, MD   Subjective   No acute overnight events. She is feeling much better today. Abdominal pain and nausea have resolved. No chest pain, shortness of breath, lightheadedness/dizziness. She is eager to go home.  Inpatient Medications    Scheduled Meds:  carvedilol  6.25 mg Oral BID WC   citalopram  10 mg Oral Daily   dapagliflozin propanediol  10 mg Oral Daily   enoxaparin (LOVENOX) injection  40 mg Subcutaneous Q24H   feeding supplement  1 Container Oral TID BM   hydrALAZINE  25 mg Oral Q8H   isosorbide mononitrate  30 mg Oral Daily   multivitamin with minerals  1 tablet Oral Daily   pantoprazole  40 mg Oral BID   Continuous Infusions:  promethazine (PHENERGAN) injection (IM or IVPB) Stopped (11/20/21 1448)   PRN Meds: acetaminophen **OR** acetaminophen, albuterol, alum & mag hydroxide-simeth, bisacodyl, cloNIDine, hydrALAZINE, promethazine (PHENERGAN) injection (IM or IVPB)   Vital Signs    Vitals:   11/24/21 2118 11/24/21 2148 11/25/21 0500 11/25/21 0627  BP: (!) 178/78 (!) 154/69  (!) 153/81  Pulse:    68  Resp:    18  Temp:    98.4 F (36.9 C)  TempSrc:    Oral  SpO2:      Weight:   76.3 kg   Height:        Intake/Output Summary (Last 24 hours) at 11/25/2021 0732 Last data filed at 11/25/2021 0600 Gross per 24 hour  Intake 120 ml  Output 700 ml  Net -580 ml      11/25/2021    5:00 AM 11/24/2021    4:14 AM 11/23/2021    4:50 AM  Last 3 Weights  Weight (lbs) 168 lb 3.2 oz 167 lb 14.4 oz 167 lb 1.6 oz  Weight (kg) 76.295 kg 76.159 kg 75.796 kg      Telemetry    Normal sinus rhythm with rates in the 50s to 60s at rest but increases to the 70s to 90s at times (suspect with more activity) - Personally Reviewed  ECG    No new ECG tracing. - Personally Reviewed  Physical Exam   GEN: No acute distress.   Neck: No JVD. Cardiac: RRR.  No murmurs, rubs, or gallops. Radial and distal pedal pulses 2+ and equal bilaterally. Respiratory: Clear to auscultation bilaterally. GI: Soft, non-distended, and non-tender. MS: No lower extremity edema. No deformity. Skin: Warm and dry. Neuro:  No focal deficits. Psych: Normal affect. Responds appropriately.  Labs    High Sensitivity Troponin:   Recent Labs  Lab 11/19/21 1100 11/19/21 1520  TROPONINIHS 32* 21*     Chemistry Recent Labs  Lab 11/20/21 0914 11/21/21 0251 11/22/21 0110 11/23/21 0417 11/24/21 0342 11/25/21 0240  NA 134* 134* 135 133* 137 140  K 3.6 3.9 4.2 4.2 4.0 4.1  CL 96* 96* 97* 98 100 104  CO2 '23 25 29 27 30 '$ 32  GLUCOSE 201* 144* 109* 119* 108* 105*  BUN 31* 47* 48* 47* 34* 26*  CREATININE 1.67* 1.89* 1.57* 1.68* 1.36* 1.21*  CALCIUM 9.3 9.1 9.2 8.9 9.1 9.3  MG 2.7* 2.8* 2.9*  --   --   --   PROT 7.7 7.1 7.0 6.6 6.5 6.2*  ALBUMIN 3.9 3.6 3.4* 3.3* 3.3* 3.1*  AST '19 16 16 17 18 17  '$ ALT '20 16 15 13 13 12  '$ ALKPHOS 68  59 54 49 45 42  BILITOT 1.2 0.9 0.7 0.7 0.7 0.7  GFRNONAA 37* 32* 39* 36* 47* 54*  ANIONGAP '15 13 9 8 7 '$ 4*    Lipids No results for input(s): CHOL, TRIG, HDL, LABVLDL, LDLCALC, CHOLHDL in the last 168 hours.  Hematology Recent Labs  Lab 11/23/21 0417 11/24/21 0342 11/25/21 0240  WBC 7.1 5.6 6.1  RBC 5.55* 5.43* 5.02  HGB 14.6 14.6 13.8  HCT 45.9 45.3 41.7  MCV 82.7 83.4 83.1  MCH 26.3 26.9 27.5  MCHC 31.8 32.2 33.1  RDW 14.8 14.7 14.8  PLT 294 267 265   Thyroid  Recent Labs  Lab 11/19/21 1219  TSH 0.540    BNP Recent Labs  Lab 11/20/21 0914 11/21/21 0251 11/22/21 0110  BNP 482.7* 99.4 36.8    DDimer No results for input(s): DDIMER in the last 168 hours.   Radiology    [place] EKG SITE RITE  Result Date: 11/23/2021 If Site Rite image not attached, placement could not be confirmed due to current cardiac rhythm.   Cardiac Studies   Echocardiogram 11/20/2021: Impressions:  1. Left ventricular ejection  fraction, by estimation, is 20 to 25%. The  left ventricle has severely decreased function. The left ventricle  demonstrates global hypokinesis. There is mild left ventricular  hypertrophy. Left ventricular diastolic parameters   are indeterminate.   2. Right ventricular systolic function is normal. The right ventricular  size is normal.   3. The pericardial effusion is circumferential.   4. The mitral valve is abnormal. Mild mitral valve regurgitation. No  evidence of mitral stenosis.   5. The aortic valve is tricuspid. Aortic valve regurgitation is not  visualized. No aortic stenosis is present.   6. IVC is small suggesting low RA pressure and hypovolemia. 1. Left ventricular ejection fraction, by estimation, is 20 to 25%. The  left ventricle has severely decreased function. The left ventricle  demonstrates global hypokinesis. There is mild left ventricular  hypertrophy. Left ventricular diastolic parameters   are indeterminate.   2. Right ventricular systolic function is normal. The right ventricular  size is normal.   3. The pericardial effusion is circumferential.   4. The mitral valve is abnormal. Mild mitral valve regurgitation. No  evidence of mitral stenosis.   5. The aortic valve is tricuspid. Aortic valve regurgitation is not  visualized. No aortic stenosis is present.   6. IVC is small suggesting low RA pressure and hypovolemia.   Patient Profile     51 y.o. 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    Assessment & Plan    Hypertensive Urgency BP as high as 198/131. There appears to be some medication  non-compliance at home. Renal artery ultrasound in 12/2020 showed no evidence of renal artery stenosis. She was restarted on home medications but developed some hypotension and AKI with this. BP has been labile this admission and she has intermittently required IV fluids for hypotension. - Systolic BP has ranged from the 140s to 170s over the last 24 hours. - Home regimen: Amlodipine '5mg'$  daily, Entresto 97-'103mg'$  twice daily, Coreg '25mg'$  twice daily, Spironolactone '25mg'$  daily, Hydralazine '100mg'$  three times daily, and Imdur '30mg'$  daily. Amlodipine, Entresto, Spironolactone, and Hydralazine were  held due to hypotension and AKI. - Hydralazine was restarted yesterday at '25mg'$  three times daily. Will increase to '50mg'$  three times daily. - Coreg was decreased yesterday to 6.'25mg'$  twice daily due to bradycardia. Rates in the 50s to 26 but looks like they are in the 70s to 90s when she is exerting herself. Will increase Coreg to 12.'5mg'$  twice daily. - Continue Imdur '30mg'$  daily. - Patient's GI symptoms have improved and appetite has now improved. I suspect we will be able to get her back on her home regimen but we are adding these back slowly. She has close follow-up arranged in Dr. Clayborne Dana office next week. Advised patient to keep BP/HR log and bring to this visit.   Non-Ischemic Cardiomyopathy Chronic Systolic CHF BNP 4,132 >> 482 >>  99.4 >> 36.8. Chest x-ray showed no acute findings. Echo this admission showed LVEF of 20-25% with global hypokinesis, trivial pericardial effusion, and mild MR. Cardiac MRI in 12/2020 showed LVEF of 22% with moderate RV dysfunction and finding suggestive of hypertensive cardiomyopathy. She received one dose of IV Lasix with worsening renal function so then was started on gentle fluids. Net negative 844 mL this admission. - Euvolemic on exam.  - Can restart PRN Lasix at home for weight gain or edema. - Continue to hold home Entresto and Spironolactone given renal function.  - Will  increase Coreg back to 12.'5mg'$  twice daily (on '25mg'$  twice daily at home) - Will increase Hydralazine back to '50mg'$  three times daily (on '100mg'$  three times daily at home). - Continue Imdur '30mg'$  daily. - Continue Farxiga '10mg'$  daily.   Mild CAD Elevated Troponin  Noted on cardiac catheterization in 12/2020. High-sensitivity troponin minimally elevated and flat at 32 >> 21 consistent with demand ischemic from hypertensive urgency. - She continue to have some epigastric pain that radiates up to her chest at times but no other chest pain. - No additional ischemic work-up necessary at this time.   Non-Sustained VT She has been noted to have NSVT this admission. Reviewed telemetry: Occasional PVC noted but no NSVT in the last 24 hours. - Potassium and Magnesium have been OK. - Continue Coreg as above.   AKI Baseline creatinine around 0.9. Creatinine 1.03 on admission and peaked at 1.89 on 11/21/2021. This occurred following diuresis and poor oral intake due to nausea. She received fluids on 11/21/2021 and 11/23/2021. - Creatinine improving and 1.21 today. - Continue to encourage PO intake. Would be very cautious with additional  IV fluids given severely reduced EF.   Epigastric Pain Nausea Abdominal chest x-ray showed no normal bowel gas pattern and bilateral nephrolithiasis but no acute findings to explain symptoms. - GI consulted and recommended conservative management with PPI and Zofran. No plands for EGD at this time.  [facility] will sign off.   Medication Recommendations: Coreg 12.5 mg twice daily, hydralazine 50 mg 3 times daily, Imdur 30 mg daily, Farxiga 10 mg daily.  Hold Entresto and spironolactone, can restart as outpatient as kidney function improves Other recommendations (labs, testing, etc): None Follow up as an outpatient: Scheduled for 5/25   For questions or updates, please contact [facility] [place] Please consult [email or web address] for contact info under        Signed, Darreld Mclean, PA-C  11/25/2021, 7:32 AM    Patient seen and examined.  Agree with above documentation.  On exam, patient is alert and oriented, regular rate and rhythm, no murmurs, lungs CTAB, no LE edema or JVD.  Renal function  continues to improve, creatinine 1.2 today.  BP elevated, will increase Coreg to 12.5 mg twice daily and hydralazine to 50 mg 3 times daily.  Okay for discharge from cardiac standpoint.  Would hold Entresto and Aldactone for now, can restart as outpatient as renal function improves  Donato Heinz, MD

## 2021-11-25 NOTE — Discharge Summary (Signed)
Physician Discharge Summary  Emily Key [id number] DOB: Jul 22, 1970 DOA: 11/19/2021  PCP: Vevelyn Francois, NP  Admit date: 11/19/2021 Discharge date: 11/25/2021  Admitted From: Home Disposition:  Home  Recommendations for Outpatient Follow-up:  Follow up with PCP in 1-2 weeks Please obtain BMP/CBC in one week Please follow up with cardiology as scheduled  Discharge Condition: Stable CODE STATUS: Full  Diet recommendation: Low-salt low-fat diet  Brief/Interim Summary: 51 y.o. 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  Patient admitted and evaluated by cardiology as below, ongoing medical management including hypertensive control, no indication for intervention or invasive diagnostics during this hospitalization.  Discharge patient on new medication list and regimen as below, recommend close follow-up with PCP as well as cardiology as scheduled due to worsening blood pressure control and heart failure.  Patient otherwise stable and agreeable for discharge at this time  Atypical chest pain secondary to hypertensive emergency.  Resolved, see below; cardiology following   Hypertensive emergency -Secondary to noncompliance  -Cardiology following, appreciate insight and recommendations  -Continue to titrate home medications, currently on carvedilol hydralazine, isosorbide; as needed clonidine -somewhat labile over the past 24 hours, previously bradycardic with symptomatic exertion   Anxiety and depression.  Continue Celexa at home dose.   Acute on chronic combined systolic and diastolic CHF EF 53%.  Plan as in #1 above, responded very well to single dose IV Lasix given on 11/19/2021.  Tautness of breath has resolved and lungs  are clear, gentle IV fluids on 11/23/2021 for hypotension and AKI.   Dysuria.  Resolved. UA unremarkable   Epigastric pain.  Resolved with increased PPI/maalox regimen   AKI, resolving.   Improving with IV fluid - previously discontinued Entresto, Norvasc & hydralazine.  Discharge Diagnoses:  Principal Problem:   Hypertensive urgency Active Problems:   Hypertension   Acute on chronic systolic CHF (congestive heart failure) (HCC)   CHF (congestive heart failure) (HCC)   AKI (acute kidney injury) ([place])   Abdominal pain, epigastric    Discharge Instructions  Discharge Instructions     Discharge patient   Complete by: As directed    Discharge disposition: 01-Home or Self Care   Discharge patient date: 11/25/2021      Allergies as of 11/25/2021   No Known Allergies      Medication List     STOP taking these medications    amLODipine 5 MG tablet Commonly known as: NORVASC   Entresto 97-103 MG Generic drug: sacubitril-valsartan   spironolactone 25 MG tablet Commonly known as: ALDACTONE       TAKE these medications    carvedilol 12.5 MG tablet Commonly known as: COREG Take 1 tablet (12.5 mg total) by mouth 2 (two) times daily with a meal. What changed:  medication strength how much to take   citalopram 10 MG tablet Commonly known as: CELEXA Take 1 tablet (10 mg total) by mouth daily. Start taking on: Nov 26, 2021 What changed:  medication strength how much to take   Farxiga 10 MG Tabs tablet Generic drug: dapagliflozin propanediol Take 1 tablet (10 mg total) by mouth daily.   furosemide 20 MG tablet Commonly known as: Lasix Take 2 tablets (40 mg total) by mouth as needed for fluid or edema.   hydrALAZINE 50 MG tablet  Commonly known as: APRESOLINE Take 1 tablet (50 mg total) by mouth 3 (three) times daily. What changed:  medication strength how much to take   isosorbide mononitrate 30 MG 24 hr tablet Commonly known as: IMDUR Take 1 tablet  (30 mg total) by mouth daily.        Follow-up Information     [facility] Follow up.   Specialty: Cardiology Why: Hospital follow-up in Dr. Clayborne Dana office scheduled for 12/02/2021 at 12:00pm. Contact information: [address] [id number] [place] [phone]               No Known Allergies  Consultations: Cardiology  Procedures/Studies: DG Chest 2 View  Result Date: 11/19/2021 CLINICAL DATA:  Central chest pain 1 week. Shortness of breath with nausea and vomiting. EXAM: CHEST - 2 VIEW COMPARISON:  12/21/2020 FINDINGS: Lungs are adequately inflated without focal airspace consolidation or effusion. Minimal stable blunting of the left costophrenic angle. Cardiomediastinal silhouette and remainder of the exam is unchanged. IMPRESSION: No acute cardiopulmonary disease. Electronically Signed   By: Marin Olp M.D.   On: 11/19/2021 11:29   DG Abd Portable 1V  Result Date: 11/20/2021 CLINICAL DATA:  Nausea and vomiting EXAM: PORTABLE ABDOMEN - 1 VIEW COMPARISON:  01/02/2019 FINDINGS: The bowel gas pattern is normal. Multiple bilateral renal calculi. Tubal ligation clips. Clear lung bases. IMPRESSION: 1. Normal bowel gas pattern. 2. Bilateral nephrolithiasis. Electronically Signed   By: Jorje Guild M.D.   On: 11/20/2021 08:11   ECHOCARDIOGRAM COMPLETE  Result Date: 11/20/2021    ECHOCARDIOGRAM REPORT   Patient Name:   Emily Key Date of Exam: 11/20/2021 Medical Rec #:  [id number]     Height:       67.0 in Accession #:    [id number]    Weight:       165.7 lb Date of Birth:  [birth year]     BSA:          1.867 m Patient Age:    51 years      BP:           114/85 mmHg Patient Gender: F             HR:           73 bpm. Exam Location:  Inpatient Procedure: 2D Echo, Cardiac Doppler and Color Doppler Indications:    CHF-acute diastolic  History:        Patient has prior history of Echocardiogram  examinations, most                 recent 12/22/2020. Risk Factors:Dyslipidemia and Hypertension.  Sonographer:    Clayton Lefort RDCS (AE) Referring Phys: [id number] [facility]  Sonographer Comments: Patient moving constantly. IMPRESSIONS  1. Left ventricular ejection fraction, by estimation, is 20 to 25%. The left ventricle has severely decreased function. The left ventricle demonstrates global hypokinesis. There is mild left ventricular hypertrophy. Left ventricular diastolic parameters  are indeterminate.  2. Right ventricular systolic function is normal. The right ventricular size is normal.  3. The pericardial effusion is circumferential.  4. The mitral valve is abnormal. Mild mitral valve regurgitation. No evidence of mitral stenosis.  5. The aortic valve is tricuspid. Aortic valve regurgitation is not visualized. No aortic stenosis is present.  6. IVC is small suggesting low RA pressure and hypovolemia. FINDINGS  Left Ventricle: Left ventricular ejection fraction, by estimation, is 20 to 25%. The left ventricle  has severely decreased function. The left ventricle demonstrates global hypokinesis. The left ventricular internal cavity size was normal in size. There is mild left ventricular hypertrophy. Left ventricular diastolic parameters are indeterminate. Right Ventricle: The right ventricular size is normal. No increase in right ventricular wall thickness. Right ventricular systolic function is normal. Left Atrium: Left atrial size was normal in size. Right Atrium: Right atrial size was normal in size. Pericardium: Trivial pericardial effusion is present. The pericardial effusion is circumferential. Mitral Valve: The mitral valve is abnormal. There is mild thickening of the mitral valve leaflet(s). There is mild calcification of the mitral valve leaflet(s). Mild mitral annular calcification. Mild mitral valve regurgitation. No evidence of mitral valve stenosis. MV peak gradient, 2.3 mmHg. The mean mitral valve  gradient is 1.0 mmHg. Tricuspid Valve: The tricuspid valve is normal in structure. Tricuspid valve regurgitation is trivial. No evidence of tricuspid stenosis. Aortic Valve: The aortic valve is tricuspid. Aortic valve regurgitation is not visualized. No aortic stenosis is present. Aortic valve mean gradient measures 1.0 mmHg. Aortic valve peak gradient measures 2.8 mmHg. Aortic valve area, by VTI measures 1.81 cm. Pulmonic Valve: The pulmonic valve was not well visualized. Pulmonic valve regurgitation is mild. No evidence of pulmonic stenosis. Aorta: The aortic root is normal in size and structure. Venous: IVC is small suggesting low RA pressure and hypovolemia. IAS/Shunts: No atrial level shunt detected by color flow Doppler.  LEFT VENTRICLE PLAX 2D LVIDd:         5.90 cm LVIDs:         5.20 cm LV PW:         1.20 cm LV IVS:        1.10 cm LVOT diam:     2.00 cm LV SV:         22 LV SV Index:   12 LVOT Area:     3.14 cm  LV Volumes (MOD) LV vol d, MOD A2C: 84.6 ml LV vol d, MOD A4C: 104.0 ml LV vol s, MOD A2C: 73.8 ml LV vol s, MOD A4C: 80.4 ml LV SV MOD A2C:     10.8 ml LV SV MOD A4C:     104.0 ml LV SV MOD BP:      19.2 ml RIGHT VENTRICLE            IVC RV Basal diam:  2.40 cm    IVC diam: 0.70 cm RV S prime:     6.09 cm/s TAPSE (M-mode): 0.7 cm LEFT ATRIUM             Index        RIGHT ATRIUM           Index LA diam:        3.10 cm 1.66 cm/m   RA Area:     12.80 cm LA Vol (A2C):   31.7 ml 16.98 ml/m  RA Volume:   29.40 ml  15.75 ml/m LA Vol (A4C):   40.1 ml 21.48 ml/m LA Biplane Vol: 35.7 ml 19.12 ml/m  AORTIC VALVE AV Area (Vmax):    1.79 cm AV Area (Vmean):   1.65 cm AV Area (VTI):     1.81 cm AV Vmax:           84.10 cm/s AV Vmean:          55.300 cm/s AV VTI:            0.120 m AV Peak Grad:      2.8  mmHg AV Mean Grad:      1.0 mmHg LVOT Vmax:         48.00 cm/s LVOT Vmean:        29.100 cm/s LVOT VTI:          0.069 m LVOT/AV VTI ratio: 0.58  AORTA Ao Root diam: 3.10 cm Ao Asc diam:  3.00 cm  MITRAL VALVE MV Area VTI:  1.46 cm   SHUNTS MV Peak grad: 2.3 mmHg   Systemic VTI:  0.07 m MV Mean grad: 1.0 mmHg   Systemic Diam: 2.00 cm MV Vmax:      0.75 m/s MV Vmean:     36.1 cm/s Carlyle Dolly MD Electronically signed by Carlyle Dolly MD Signature Date/Time: 11/20/2021/3:02:06 PM    Final    CT Angio Chest/Abd/Pel for Dissection W and/or Wo Contrast  Result Date: 11/19/2021 CLINICAL DATA:  Acute aortic syndrome. EXAM: CT ANGIOGRAPHY CHEST, ABDOMEN AND PELVIS TECHNIQUE: Non-contrast CT of the chest was initially obtained. Multidetector CT imaging through the chest, abdomen and pelvis was performed using the standard protocol during bolus administration of intravenous contrast. Multiplanar reconstructed images and MIPs were obtained and reviewed to evaluate the vascular anatomy. RADIATION DOSE REDUCTION: This exam was performed according to the departmental dose-optimization program which includes automated exposure control, adjustment of the mA and/or kV according to patient size and/or use of iterative reconstruction technique. CONTRAST:  161m OMNIPAQUE IOHEXOL 350 MG/ML SOLN COMPARISON:  January 02, 2019.  February 14, 2015. FINDINGS: CTA CHEST FINDINGS Cardiovascular: Preferential opacification of the thoracic aorta. No evidence of thoracic aortic aneurysm or dissection. Mild cardiomegaly is noted. No pericardial effusion. Mediastinum/Nodes: No enlarged mediastinal, hilar, or axillary lymph nodes. Thyroid gland, trachea, and esophagus demonstrate no significant findings. Lungs/Pleura: Lungs are clear. No pleural effusion or pneumothorax. Musculoskeletal: No chest wall abnormality. No acute or significant osseous findings. Review of the MIP images confirms the above findings. CTA ABDOMEN AND PELVIS FINDINGS VASCULAR Aorta: Normal caliber aorta without aneurysm, dissection, vasculitis or significant stenosis. Celiac: Patent without evidence of aneurysm, dissection, vasculitis or significant stenosis. SMA:  Patent without evidence of aneurysm, dissection, vasculitis or significant stenosis. Renals: Both renal arteries are patent without evidence of aneurysm, dissection, vasculitis, fibromuscular dysplasia or significant stenosis. IMA: Patent without evidence of aneurysm, dissection, vasculitis or significant stenosis. Inflow: Patent without evidence of aneurysm, dissection, vasculitis or significant stenosis. Veins: No obvious venous abnormality within the limitations of this arterial phase study. Review of the MIP images confirms the above findings. NON-VASCULAR Hepatobiliary: No focal liver abnormality is seen. No gallstones, gallbladder wall thickening, or biliary dilatation. Pancreas: Unremarkable. No pancreatic ductal dilatation or surrounding inflammatory changes. Spleen: Normal in size without focal abnormality. Adrenals/Urinary Tract: Adrenal glands are unremarkable. Bilateral nonobstructive nephrolithiasis is noted. No hydronephrosis or renal obstruction is noted. Bladder is unremarkable. Stomach/Bowel: Stomach is within normal limits. Appendix appears normal. No evidence of bowel wall thickening, distention, or inflammatory changes. Lymphatic: No definite adenopathy is noted. Reproductive: Uterus and bilateral adnexa are unremarkable. Other: No abdominal wall hernia or abnormality. No abdominopelvic ascites. Musculoskeletal: No acute or significant osseous findings. Review of the MIP images confirms the above findings. IMPRESSION: No evidence of thoracic or abdominal aortic dissection or aneurysm. Bilateral nonobstructive nephrolithiasis. Mild cardiomegaly. Electronically Signed   By: JMarijo ConceptionM.D.   On: 11/19/2021 13:53   [place] SITE RITE  Result Date: 11/23/2021 If Site Rite image not attached, placement could not be confirmed due to current cardiac rhythm.  Subjective: No acute issues or events overnight   Discharge Exam: Vitals:   11/25/21 0627 11/25/21 0838  BP: (!) 153/81 (!)  158/92  Pulse: 68   Resp: 18   Temp: 98.4 F (36.9 C)   SpO2:     Vitals:   11/24/21 2148 11/25/21 0500 11/25/21 0627 11/25/21 0838  BP: (!) 154/69  (!) 153/81 (!) 158/92  Pulse:   68   Resp:   18   Temp:   98.4 F (36.9 C)   TempSrc:   Oral   SpO2:      Weight:  76.3 kg    Height:        General: Pt is alert, awake, not in acute distress Cardiovascular: RRR, S1/S2 +, no rubs, no gallops Respiratory: CTA bilaterally, no wheezing, no rhonchi Abdominal: Soft, NT, ND, bowel sounds + Extremities: no edema, no cyanosis    The results of significant diagnostics from this hospitalization (including imaging, microbiology, ancillary and laboratory) are listed below for reference.     Microbiology: No results found for this or any previous visit (from the past 240 hour(s)).   Labs: BNP (last 3 results) Recent Labs    11/20/21 0914 11/21/21 0251 11/22/21 0110  BNP 482.7* 99.4 70.3   Basic Metabolic Panel: Recent Labs  Lab 11/20/21 0914 11/21/21 0251 11/22/21 0110 11/23/21 0417 11/24/21 0342 11/25/21 0240  NA 134* 134* 135 133* 137 140  K 3.6 3.9 4.2 4.2 4.0 4.1  CL 96* 96* 97* 98 100 104  CO2 '23 25 29 27 30 '$ 32  GLUCOSE 201* 144* 109* 119* 108* 105*  BUN 31* 47* 48* 47* 34* 26*  CREATININE 1.67* 1.89* 1.57* 1.68* 1.36* 1.21*  CALCIUM 9.3 9.1 9.2 8.9 9.1 9.3  MG 2.7* 2.8* 2.9*  --   --   --    Liver Function Tests: Recent Labs  Lab 11/21/21 0251 11/22/21 0110 11/23/21 0417 11/24/21 0342 11/25/21 0240  AST '16 16 17 18 17  '$ ALT '16 15 13 13 12  '$ ALKPHOS 59 54 49 45 42  BILITOT 0.9 0.7 0.7 0.7 0.7  PROT 7.1 7.0 6.6 6.5 6.2*  ALBUMIN 3.6 3.4* 3.3* 3.3* 3.1*   Recent Labs  Lab 11/19/21 1219  LIPASE 20   No results for input(s): AMMONIA in the last 168 hours. CBC: Recent Labs  Lab 11/21/21 0251 11/22/21 0110 11/23/21 0417 11/24/21 0342 11/25/21 0240  WBC 15.9* 11.6* 7.1 5.6 6.1  NEUTROABS 13.4* 8.8* 4.5 2.4 2.7  HGB 16.5* 15.3* 14.6 14.6 13.8   HCT 48.5* 47.9* 45.9 45.3 41.7  MCV 80.0 82.0 82.7 83.4 83.1  PLT 338 335 294 267 265   Cardiac Enzymes: No results for input(s): CKTOTAL, CKMB, CKMBINDEX, TROPONINI in the last 168 hours. BNP: Invalid input(s): POCBNP CBG: No results for input(s): GLUCAP in the last 168 hours. D-Dimer No results for input(s): DDIMER in the last 72 hours. Hgb A1c No results for input(s): HGBA1C in the last 72 hours. Lipid Profile No results for input(s): CHOL, HDL, LDLCALC, TRIG, CHOLHDL, LDLDIRECT in the last 72 hours. Thyroid function studies No results for input(s): TSH, T4TOTAL, T3FREE, THYROIDAB in the last 72 hours.  Invalid input(s): FREET3 Anemia work up No results for input(s): VITAMINB12, FOLATE, FERRITIN, TIBC, IRON, RETICCTPCT in the last 72 hours. Urinalysis    Component Value Date/Time   COLORURINE YELLOW 11/19/2021 1048   APPEARANCEUR CLEAR 11/19/2021 1048   LABSPEC 1.011 11/19/2021 1048   PHURINE 5.0 11/19/2021 1048   GLUCOSEU >=  500 (A) 11/19/2021 1048   HGBUR MODERATE (A) 11/19/2021 1048   BILIRUBINUR NEGATIVE 11/19/2021 1048   BILIRUBINUR neg 02/05/2021 1142   KETONESUR NEGATIVE 11/19/2021 1048   PROTEINUR 100 (A) 11/19/2021 1048   UROBILINOGEN 0.2 02/05/2021 1142   UROBILINOGEN 0.2 02/09/2015 1242   NITRITE NEGATIVE 11/19/2021 1048   LEUKOCYTESUR NEGATIVE 11/19/2021 1048   Sepsis Labs Invalid input(s): PROCALCITONIN,  WBC,  LACTICIDVEN Microbiology No results found for this or any previous visit (from the past 240 hour(s)).   Time coordinating discharge: Over 30 minutes  SIGNED:   Little Ishikawa, DO [facility] 11/25/2021, 5:43 PM Pager   If 7PM-7AM, please contact night-coverage [email or web address]

## 2021-11-26 ENCOUNTER — Other Ambulatory Visit: Payer: Self-pay

## 2021-11-30 NOTE — Progress Notes (Signed)
Advanced Heart Failure Clinic Note   PCP: Vevelyn Francois, NP PCP-Cardiologist: Glori Bickers, MD  HF Cardiologist: Dr. Haroldine Laws  HPI: 51 y.o.AAF w/ h/o HTN and chronic systolic heart failure. In May 2016, she underwent left VATS w/ drainage of empyema/ abscess and decortication by Dr. Roxan Hockey. Echocardiogram showed normal EF 50-55%.   She was admitted again 02/2015 w/ gastroenteritis/ sepsis and HTN urgency. Cardiology consulted for elevated troponin. Echo showed mildly reduced LVEF 40-45%, RV normal. LHC showed normal coronaries. Drop in EF felt to be stress induced. Unfortunately, she did not f/u w/ cardiology following that hospitalization.   Presented to ED on 11/20/20 w/ CP and exertional dyspnea. She had stopped taking all of her BP meds ~4 months ago due to finances. In ED, BP 220/137. She was admitted and restarted on PO antihypertensives and IV Lasix. Echo showed severely reduced EF, down to 20-25%, GIIIDD, no LVH, Global HK, RV mildly reduced, RVSP 60 mmHg, mod MR, Mod TR. cMRI showed severely reduced LVEF, moderately reduced RV, small pericardia effusion. Findings consistent with HTN heart disease and pulmonary HTN. She was started on GDMT. D/c weight 153 lbs.  Today she returns for HF follow up. Bp at home still 150s/90s, trouble taking hydralazine tid. Dyspnea with stairs or incline. Some atypical chest heaviness 2-3x/week, resolves spontaneously. +3 pillow orthopnea. Denies increasing dizziness, edema. Appetite ok. Weight at home 170-175 pounds. Taking all medications. Took lasix twice in last couple of months.  Cardiac Studies   - Echo (5/16):  LVEF 50-55%, RV normal - Echo (8/16): LVEF 40-45%, AK of apical myocardium, G2DD, RV normal - Echo (6/20): LVEF 40-45%, RV mildly reduced - Echo (6/22): LVEF 20-25%, GIIIDD, no LVH, Global HK, RV mildly reduced, RVSP 60 mmHg, mod MR, Mod TR   - LHC (2016): normal coronaries   - R/LHC (6/16): Prox LAD lesion is 45% stenosed.    Findings:   Ao = 177/94 (126) LV = 176/18 RA = 5 RV = 64/9 PA = 64/17 (35) PCW = 18 Fick cardiac output/index = 5.0/2.8 PVR = 3.4 WU Ao sat = 97% PA sat = 68%, 69%   Assessment: 1. Mild CAD 2. Severe NICM likely due to HTN CM 3. 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 Left-sided pressures relatively well compemsated.   - Renal artery u/s (6/22): no RAS or FMD   - cMRI (6/22): Severely reduced LV systolic function with moderate LV dilation, LVEF 22%. Global hypokinesis. Moderately reduced RV systolic function with mild RV enlargement. RVEF 35%. Delayed myocardial enhancement in the superior and inferior RV insertion points, with extension to the midmyocardium in the antero- and inferoseptum. May represent increased pulmartery pressure. Small circumferential pericardial effusion. Findings most consistent with hypertensive heart disease and pulmonary hypertension.  ROS: All systems reviewed and negative except as per HPI.   Past Medical History:  Diagnosis Date   Cerumen impaction 01/2019   CHF (congestive heart failure) (HCC)    after last pregnancy   Daily headache    Heart murmur    "born w/one":   History of hiatal hernia    Hyperlipidemia 01/2019   Hypertension    Hypertensive emergency 01/02/2019   Observed seizure-like activity ([place]) 01/02/2019   Pneumonia 11/2014   Seasonal allergies 01/2019   Vitamin D deficiency 01/2019   Current Outpatient Medications  Medication Sig Dispense Refill   carvedilol (COREG) 12.5 MG tablet Take 1 tablet (12.5 mg total) by mouth 2 (two) times daily with a meal. 60 tablet  1   citalopram (CELEXA) 10 MG tablet Take 1 tablet (10 mg total) by mouth daily. 30 tablet 0   dapagliflozin propanediol (FARXIGA) 10 MG TABS tablet Take 1 tablet (10 mg total) by mouth daily. 30 tablet 5   furosemide (LASIX) 20 MG tablet Take 2 tablets (40 mg total) by mouth as needed for fluid or edema. 30 tablet 11   hydrALAZINE (APRESOLINE) 50 MG tablet Take 1  tablet (50 mg total) by mouth 3 (three) times daily. 90 tablet 0   isosorbide mononitrate (IMDUR) 30 MG 24 hr tablet Take 1 tablet (30 mg total) by mouth daily. 30 tablet 5   No current facility-administered medications for this visit.   No Known Allergies  Social History   Socioeconomic History   Marital status: Divorced    Spouse name: Not on file   Number of children: Not on file   Years of education: Not on file   Highest education level: Not on file  Occupational History   Not on file  Tobacco Use   Smoking status: Former    Packs/day: 0.33    Years: 10.00    Pack years: 3.30    Types: Cigarettes    Quit date: 11/11/2014    Years since quitting: 7.0   Smokeless tobacco: Never  Substance and Sexual Activity   Alcohol use: Yes    Alcohol/week: 0.0 standard drinks    Comment: 8/1/20176 "a couple beers a couple times/month"   Drug use: No   Sexual activity: Yes  Other Topics Concern   Not on file  Social History Narrative   Not on file   Social Determinants of Health   Financial Resource Strain: High Risk   Difficulty of Paying Living Expenses: Hard  Food Insecurity: Food Insecurity Present   Worried About Charity fundraiser in the Last Year: Sometimes true   Ran Out of Food in the Last Year: Sometimes true  Transportation Needs: Unmet Transportation Needs   Lack of Transportation (Medical): Yes   Lack of Transportation (Non-Medical): Yes  Physical Activity: Not on file  Stress: Not on file  Social Connections: Not on file  Intimate Partner Violence: Not on file   Family History  Problem Relation Age of Onset   Diabetes Mellitus II Mother    Hypertension Mother    Lung cancer Father    There were no vitals taken for this visit.  Wt Readings from Last 3 Encounters:  11/25/21 76.3 kg (168 lb 3.2 oz)  03/29/21 77.1 kg (170 lb)  03/02/21 80.2 kg (176 lb 12.8 oz)   PHYSICAL EXAM: General:  NAD. No resp difficulty HEENT: Normal Neck: Supple. No JVD.  Carotids 2+ bilat; no bruits. No lymphadenopathy or thryomegaly appreciated. Cor: PMI nondisplaced. Regular rate & rhythm. No rubs, gallops or murmurs. Lungs: Clear Abdomen: Soft, nontender, nondistended. No hepatosplenomegaly. No bruits or masses. Good bowel sounds. Extremities: No cyanosis, clubbing, rash, edema Neuro: Alert & oriented x 3, cranial nerves grossly intact. Moves all 4 extremities w/o difficulty. Affect pleasant.  ASSESSMENT & PLAN: Chronic Systolic Heart Failure with chest pressure - NICM. LHC in 2016 showed normal coronaries - Echo 11/2014:  LVEF 50-55%, RV normal - Echo 02/2015: LVEF 40-45% (felt stress induced) AK of apical myocardium, G2DD, RV normal - Echo 12/2018: LVEF 40-45%, RV mildly reduced - Echo (6/22): EF 20-25%, RV mildly reduced, in the setting of poorly controlled HTN/ hypertensive emergency  - L/RHC (6/22) w/ mild CAD. Suspect CM 2/2 HTN, RHC  after diureses showed well compensated left sided filling pressures - cMRI (6/22): LV EF 22% severely reduced, RVEF 35% moderately down, small circumferential pericardial effusion, findings consistent with hypertensive heart disease and pulmonary HTN. - Improved NYHA II. Volume is good today. - Continue lasix 40 mg prn for fluid overload.  - Continue Entresto 97-103 mg bid.  - Continue spironolactone 25 mg daily. - Continue Coreg 25 mg bid.  - Continue hydralazine 100 mg tid + Imdur 30 mg daily (HA better with Tylenol). - Continue Farxiga 10 mg daily (hgb A1c 5.7). - BMET today.   2. Hypertension - Remains elevated. - Start amlodipine 5 mg daily. - Continue to check BP at home and log. - Will not increase Imdur as she has had HA with this.   3. 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   4. Pulmonary HTN - RVSP severely elevated on echo, 60 mHg. - Suspect primarily 2/2 left sided heart disease/ acute CHF w/ elevated filling pressures. - RHC w/ mild to moderate mixed pulmonary HTN. - Sleep  study scheduled 03/29/21.   5. Tobacco abuse - She remains quit.    6. CAD - LAD 45%. - Mild CP with deep inspiration.  - Continue statin.   She has been compliant with her GDMT. Will have her follow up with Dr. Haroldine Laws + echo in 8-10 weeks.  [place], FNP 11/30/21

## 2021-12-01 ENCOUNTER — Telehealth (HOSPITAL_COMMUNITY): Payer: Self-pay

## 2021-12-01 NOTE — Telephone Encounter (Signed)
Called to confirm/remind patient of their appointment at the [facility] on 12/02/21.   Patient reminded to bring all medications and/or complete list.  Confirmed patient has transportation. Gave directions, instructed to utilize [place] parking.  Confirmed appointment prior to ending call.

## 2021-12-02 ENCOUNTER — Other Ambulatory Visit: Payer: Self-pay

## 2021-12-02 ENCOUNTER — Ambulatory Visit (HOSPITAL_COMMUNITY)
Admit: 2021-12-02 | Discharge: 2021-12-02 | Disposition: A | Discharge status: Home / Self Care | Payer: 59 | Attending: Family Medicine | Admitting: Family Medicine

## 2021-12-02 ENCOUNTER — Encounter (HOSPITAL_COMMUNITY): Payer: Self-pay

## 2021-12-02 VITALS — BP 180/104 | HR 60 | Ht 67.0 in | Wt 182.0 lb

## 2021-12-02 DIAGNOSIS — I3139 Other pericardial effusion (noninflammatory): Secondary | ICD-10-CM | POA: Insufficient documentation

## 2021-12-02 DIAGNOSIS — I272 Pulmonary hypertension, unspecified: Secondary | ICD-10-CM | POA: Insufficient documentation

## 2021-12-02 DIAGNOSIS — I2510 Atherosclerotic heart disease of native coronary artery without angina pectoris: Secondary | ICD-10-CM | POA: Insufficient documentation

## 2021-12-02 DIAGNOSIS — Z87891 Personal history of nicotine dependence: Secondary | ICD-10-CM | POA: Insufficient documentation

## 2021-12-02 DIAGNOSIS — Z79899 Other long term (current) drug therapy: Secondary | ICD-10-CM | POA: Insufficient documentation

## 2021-12-02 DIAGNOSIS — Z7984 Long term (current) use of oral hypoglycemic drugs: Secondary | ICD-10-CM | POA: Diagnosis not present

## 2021-12-02 DIAGNOSIS — R0789 Other chest pain: Secondary | ICD-10-CM | POA: Insufficient documentation

## 2021-12-02 DIAGNOSIS — I5022 Chronic systolic (congestive) heart failure: Secondary | ICD-10-CM | POA: Insufficient documentation

## 2021-12-02 DIAGNOSIS — I428 Other cardiomyopathies: Secondary | ICD-10-CM | POA: Insufficient documentation

## 2021-12-02 DIAGNOSIS — Z91148 Patient's other noncompliance with medication regimen for other reason: Secondary | ICD-10-CM | POA: Insufficient documentation

## 2021-12-02 DIAGNOSIS — I160 Hypertensive urgency: Secondary | ICD-10-CM | POA: Diagnosis not present

## 2021-12-02 DIAGNOSIS — I340 Nonrheumatic mitral (valve) insufficiency: Secondary | ICD-10-CM | POA: Diagnosis not present

## 2021-12-02 DIAGNOSIS — I110 Hypertensive heart disease with heart failure: Secondary | ICD-10-CM | POA: Insufficient documentation

## 2021-12-02 LAB — BASIC METABOLIC PANEL
Anion gap: 5 (ref 5–15)
BUN: 16 mg/dL (ref 6–20)
CO2: 27 mmol/L (ref 22–32)
Calcium: 9.3 mg/dL (ref 8.9–10.3)
Chloride: 107 mmol/L (ref 98–111)
Creatinine, Ser: 1.13 mg/dL — ABNORMAL HIGH (ref 0.44–1.00)
GFR, Estimated: 59 mL/min — ABNORMAL LOW (ref >60)
Glucose, Bld: 100 mg/dL — ABNORMAL HIGH (ref 70–99)
Potassium: 5.1 mmol/L (ref 3.5–5.1)
Sodium: 139 mmol/L (ref 135–145)

## 2021-12-02 LAB — CBC
HCT: 39.6 % (ref 36.0–46.0)
Hemoglobin: 12.1 g/dL (ref 12.0–15.0)
MCH: 26.5 pg (ref 26.0–34.0)
MCHC: 30.6 g/dL (ref 30.0–36.0)
MCV: 86.7 fL (ref 80.0–100.0)
Platelets: 255 10*3/uL (ref 150–400)
RBC: 4.57 MIL/uL (ref 3.87–5.11)
RDW: 15.8 % — ABNORMAL HIGH (ref 11.5–15.5)
WBC: 6.9 10*3/uL (ref 4.0–10.5)
nRBC: 0 % (ref 0.0–0.2)

## 2021-12-02 LAB — BRAIN NATRIURETIC PEPTIDE: B Natriuretic Peptide: 495.3 pg/mL — ABNORMAL HIGH (ref 0.0–100.0)

## 2021-12-02 MED ORDER — ATORVASTATIN CALCIUM 20 MG PO TABS
20.0000 mg | ORAL_TABLET | Freq: Every day | ORAL | 3 refills | Status: DC
  Filled 2021-12-02: qty 90, 90d supply, fill #0

## 2021-12-02 MED ORDER — DAPAGLIFLOZIN PROPANEDIOL 10 MG PO TABS: 10.0000 mg | ORAL_TABLET | Freq: Every day | ORAL | 5 refills | Status: DC

## 2021-12-02 MED ORDER — ISOSORBIDE MONONITRATE ER 30 MG PO TB24
30.0000 mg | ORAL_TABLET | Freq: Every day | ORAL | 3 refills | Status: DC
  Filled 2021-12-02: qty 90, 90d supply, fill #0

## 2021-12-02 NOTE — Patient Instructions (Addendum)
Please restart: Farxgia '10mg'$  daily.  Imdur '30mg'$  daily  Atrovastatin '20mg'$  daily  Labs done today, your results will be available in MyChart, we will contact you for abnormal readings.  You have been referred to the Paramedicine Team. They will contact you and arrange your appointment.  Please follow up with our heart failure pharmacist in 3-4 weeks.  Your physician recommends that you schedule a follow-up appointment as scheduled.  If you have any questions or concerns before your next appointment please send [place] a message through [place] or call our office at [phone].    TO LEAVE A MESSAGE FOR THE NURSE SELECT OPTION 2, PLEASE LEAVE A MESSAGE INCLUDING: YOUR NAME DATE OF BIRTH CALL BACK NUMBER REASON FOR CALL**this is important as we prioritize the call backs  YOU WILL RECEIVE A CALL BACK THE SAME DAY AS LONG AS YOU CALL BEFORE 4:00 PM  At the [facility], you and your health needs are our priority. As part of our continuing mission to provide you with exceptional heart care, we have created designated Provider Care Teams. These Care Teams include your primary Cardiologist (physician) and Advanced Practice Providers (APPs- Physician Assistants and Nurse Practitioners) who all work together to provide you with the care you need, when you need it.   You may see any of the following providers on your designated Care Team at your next follow up: Dr Glori Bickers Dr Haynes Kerns, NP Lyda Jester, [place] [facility] [place], [place] Audry Riles, PharmD   Please be sure to bring in all your medications bottles to every appointment.

## 2021-12-03 ENCOUNTER — Telehealth (HOSPITAL_COMMUNITY): Payer: Self-pay | Admitting: Licensed Clinical Social Worker

## 2021-12-03 NOTE — Telephone Encounter (Signed)
CSW called pt to discuss referral to [employer] referral.  Unable to reach- left VM requesting return call  Emily Key, [facility] Desk#: [phone] Cell#: [phone]

## 2021-12-08 ENCOUNTER — Telehealth (HOSPITAL_COMMUNITY): Payer: Self-pay | Admitting: Licensed Clinical Social Worker

## 2021-12-08 NOTE — Progress Notes (Signed)
Heart and Vascular Care Navigation  12/08/2021  Emily Key March 09, 1971 [id number]  Reason for Referral: Paramedicine referral   Engaged with patient by telephone for initial visit for Heart and Vascular Care Coordination.                                                                                                   Paramedicine Initial Assessment:  Housing:  In what kind of housing do you live? House/apt/trailer/shelter? home  Do you live with anyone? With two sisters, son, and a sisters boyfriend  Are you currently worried about losing your housing? no  Social:  What is your current marital status? divorced  Do you have any children? Two sons, one lives with her and the other in [place]  Do you have family or friends who live locally? Has a brother who lives locally  Food:  No- receives food stamps  Income:  What is your current source of income? None at this time- has not worked since last summer.  Applied for disability last summer but was denied got that denial in march  Do you have outstanding medical bills?  Insurance:  Are you currently insured? [employer]  Do you have prescription coverage? yes  If no insurance, have you applied for coverage (Medicaid, disability, marketplace etc)?  No but CSW sent message to financial counselors to screen for Medicaid.  Interested in reapplying for disability- will refer to [facility]  Transportation:  Do you have transportation to your medical appointments? Not consistent access to a car- shares her car with her sister so can't use it all the time- reports this has lead to her missing appts in the past.   Daily Health Needs: Do you have a working scale at home? yes  How do you manage your medications at home? Was taking them out of the bottle but supplied a pillbox   Do you ever take your medications differently than prescribed?no  Do you have issues affording your medications? Reports no  Do  you have any concerns with mobility at home? no  Do you use any assistive devices at home or have PCS at home? no  Do you have a PCP? [employer]   Are there any additional barriers you see to getting the care you need? None reported                                  HRT/VAS Care Coordination     Outpatient Care Team [facility] Paramedicine; Social Worker   Living arrangements for the past 2 months Single Family Home   Lives with: Siblings; Adult Children   Patient Current Librarian, academic   Patient Has Concern With Paying Medical Bills Yes   Medical Bill Referrals: reached out to [facility] for medicaid screening   Home Assistive Devices/Equipment None       Social History:  SDOH Screenings   Alcohol Screen: Not on file  Depression (PHQ2-9): Medium Risk   PHQ-2 Score: 13  Financial Resource Strain: High Risk   Difficulty of Paying Living Expenses: Hard  Food Insecurity: No Food Insecurity   Worried About Charity fundraiser in the Last Year: Never true   Ran Out of Food in the Last Year: Never true  Housing: [place] Risk Score: 0  Physical Activity: Not on file  Social Connections: Not on file  Stress: Not on file  Tobacco Use: Medium Risk   Smoking Tobacco Use: Former   Smokeless Tobacco Use: Never   Passive Exposure: Not on file  Transportation Needs: Unmet Transportation Needs   Lack of Transportation (Medical): Yes   Lack of Transportation (Non-Medical): Yes    Follow-up plan:    Referral sent out  to paramedics for review and assignment  Jorge Ny, [facility] Desk#: [phone] Cell#: [phone]

## 2021-12-09 ENCOUNTER — Telehealth (HOSPITAL_COMMUNITY): Payer: Self-pay | Admitting: Emergency Medicine

## 2021-12-09 ENCOUNTER — Other Ambulatory Visit: Payer: Self-pay

## 2021-12-09 NOTE — Telephone Encounter (Signed)
Called pt w/ no answer and LVM for her to call me back.  I will reach out again tomorrow.

## 2021-12-10 ENCOUNTER — Other Ambulatory Visit: Payer: Self-pay

## 2021-12-10 ENCOUNTER — Telehealth (HOSPITAL_COMMUNITY): Payer: Self-pay | Admitting: Pharmacy Technician

## 2021-12-10 ENCOUNTER — Other Ambulatory Visit (HOSPITAL_COMMUNITY): Payer: Self-pay

## 2021-12-10 MED ORDER — DAPAGLIFLOZIN PROPANEDIOL 10 MG PO TABS
10.0000 mg | ORAL_TABLET | Freq: Every day | ORAL | 3 refills | Status: DC
  Filled 2021-12-10: qty 90, 90d supply, fill #0

## 2021-12-10 NOTE — Addendum Note (Signed)
Addended by: Scarlette Calico on: 12/10/2021 04:51 PM   Modules accepted: Orders

## 2021-12-10 NOTE — Telephone Encounter (Signed)
Advanced Heart Failure Patient Advocate Encounter  Received notification from [employer] that its time to renew the patient's [facility] assistance. The patient is currently not taking Entresto. No further action needed at this time.  The patient is currently insured through a commercial plan. When the [place] assistance runs out on 06/20, the 90 day co-pay would be $45. Patient can use co-pay card and get the medication for $0. Sent 90 day RX request to [employer] banker, corporate) to send to CHW.  Charlann Boxer, CPhT

## 2021-12-13 ENCOUNTER — Other Ambulatory Visit: Payer: Self-pay

## 2021-12-14 ENCOUNTER — Telehealth (HOSPITAL_COMMUNITY): Payer: Self-pay | Admitting: Emergency Medicine

## 2021-12-14 NOTE — Telephone Encounter (Signed)
No answer, left voicemail telling her I will call her back tomorrow or she could call me back at any time.  Trying to establish home introduction/visit

## 2021-12-16 ENCOUNTER — Telehealth (HOSPITAL_COMMUNITY): Payer: Self-pay | Admitting: Emergency Medicine

## 2021-12-16 NOTE — Telephone Encounter (Signed)
Called and left message for Emily Key to call me back.  I need to schedule a visit and get her the [facility] application I received from [place]

## 2021-12-20 ENCOUNTER — Other Ambulatory Visit: Payer: Self-pay

## 2021-12-20 ENCOUNTER — Other Ambulatory Visit: Payer: Self-pay | Admitting: Nurse Practitioner

## 2021-12-21 ENCOUNTER — Telehealth (HOSPITAL_COMMUNITY): Payer: Self-pay | Admitting: Emergency Medicine

## 2021-12-21 NOTE — Telephone Encounter (Signed)
Called this morning @ 10:23.  Got voice mail.  Left message for her to call me and let her know I'm trying to get a signature for [facility] app for Tammy Sours

## 2021-12-22 ENCOUNTER — Other Ambulatory Visit: Payer: Self-pay

## 2021-12-23 NOTE — Progress Notes (Incomplete)
***In Progress***    Advanced Heart Failure Clinic Note   PCP: Vevelyn Francois, NP PCP-Cardiologist: Glori Bickers, MD  HF Cardiologist: Dr. Haroldine Laws   HPI: 51 y.o.AAF w/ h/o HTN and chronic systolic heart failure. In May 2016, she underwent left VATS w/ drainage of empyema/ abscess and decortication by Dr. Roxan Hockey. Echocardiogram showed normal EF 50-55%.    She was admitted again 02/2015 with gastroenteritis/sepsis and HTN urgency. Cardiology consulted for elevated troponin. Echo showed mildly reduced LVEF 40-45%, RV normal. LHC showed normal coronaries. Drop in EF felt to be stress induced. Unfortunately, she did not f/u w/ cardiology following that hospitalization.   Presented to ED on 11/20/2020 with CP and exertional dyspnea. She had stopped taking all of her BP meds ~4 months ago due to finances. In ED, BP 220/137. She was admitted and restarted on PO antihypertensives and IV Lasix. Echo showed severely reduced EF, down to 20-25%, GIIIDD, no LVH, Global HK, RV mildly reduced, RVSP 60 mmHg, mod MR, Mod TR. cMRI showed severely reduced LVEF, moderately reduced RV, small pericardia effusion. Findings consistent with HTN heart disease and pulmonary HTN. She was started on GDMT. Discharge weight 153 lbs.   Seen 02/2021, NYHA II, volume ok. Amlodipine was added for BP control. Lost to follow up.   Admitted 11/2021 with CP and hypertensive emergency, in setting of medication non-compliance. BP 198/131. Echo showed EF 20-25%, severe LV dysfunction with global HK, RV ok, circumferential pericardial effusion. She was given IV lasix. Cardiology consulted and GDMT titrated. BP labile and with AKI, received IVF. Discharged home on carvedilol 12.5 bid, hydralazine 50 tid, Imdur 30 daily and Farxiga 10 mg daily; weight 167 lbs.   She returned on 12/02/2021 for post hospital HF follow up. Overall feeling fine. She was not SOB with activity or lifting. Denied palpitations, chest pain, dizziness, edema,  HA, PND, orthopnea. Her appetite was ok. She was not weighing herself at home. Living with her sister. She had not been taking [place] or Imdur and was taking extra carvedilol. Remains abstinent from smoking, ETOH or drug use.  Today she returns to HF clinic for pharmacist medication titration. At last visit with NP she was restarted on Imdur 30 mg daily, Farxiga 10 mg daily, and atorvastatin 20 mg daily. She was also enrolled in paramedicine.   Overall feeling ***. Dizziness, lightheadedness, fatigue:  Chest pain or palpitations:  How is your breathing?: *** SOB: Able to complete all ADLs. Activity level ***  Weight at home pounds. LEE PND/Orthopnea  Appetite *** Low-salt diet:   Physical Exam Cost/affordability of meds   HF Medications: Carvedilol 18.75 mg BID Farxiga 10 mg daily Imdur 30 mg daily Hydralazine 50 mg TID  Has the patient been experiencing any side effects to the medications prescribed?  {YES NO:22349}  Does the patient have any problems obtaining medications due to transportation or finances? [employer] commercial insurance- can use copay cards  Understanding of regimen: {excellent/good/fair/poor:19665} Understanding of indications: {excellent/good/fair/poor:19665} Potential of compliance: {excellent/good/fair/poor:19665} Patient understands to avoid NSAIDs. Patient understands to avoid decongestants.    Pertinent Lab Values: Labs 12/02/2021: Serum creatinine 1.13, BUN 16, Potassium 5.1, Sodium 139, BNP 495.3  Vital Signs: Weight: *** (last clinic weight: 182 lbs) Blood pressure: *** 180/102 Heart rate: *** 60  Plan BMET today?- odd K 5.1, was consistently in the 4s prior to last labs A. entresto 24/26 mg BID $0 with copay card - labs 1 week B. Imdur to 60, hydrazaline to 75 tid  Assessment/Plan: Chronic Systolic Heart Failure with chest pressure - NICM. LHC in 2016 showed normal coronaries - Echo 11/2014:  LVEF 50-55%, RV normal - Echo  02/2015: LVEF 40-45% (felt stress induced) AK of apical myocardium, G2DD, RV normal - Echo 12/2018: LVEF 40-45%, RV mildly reduced - Echo (12/2020): EF 20-25%, RV mildly reduced, in the setting of poorly controlled HTN/ hypertensive emergency  - L/RHC (12/2020) with mild CAD. Suspect CM 2/2 HTN, RHC after diureses showed well compensated left sided filling pressures - cMRI (12/2020): LV EF 22% severely reduced, RVEF 35% moderately down, small circumferential pericardial effusion, findings consistent with hypertensive heart disease and pulmonary HTN. - Echo (11/2021) EF 20-25%, RV ok - NYHA II. Volume is good today. *** - Continue carvedilol 18.75 mg bid.  - Continue Farxiga 10 mg daily. - Continue hydralazine 50 mg tid. - Continue Imdur 30 mg daily. - Repeat echo when GDMT optimized. Consider ltd echo in 6 weeks to evaluate pericardial effusion.   2. Hypertensive Urgency - Elevated today. *** - Instructed to take hydralazine 50 mg x 1 in clinic. - She will take Imdur when she gets home. - Given Rx for BP cuff, asked to check and log. Notify clinic if SBP> 180. - Labs today.   3. MR - Moderate on echo 12/2020, likely functional in setting of dilated LV/RV. - Mild on most recent echo 11/2021.   4. Pulmonary HTN - RVSP severely elevated on echo (12/2020), 60 mHg. - Suspect primarily 2/2 left sided heart disease/ acute CHF w/ elevated filling pressures. - RHC (12/2020) w/ mild to moderate mixed pulmonary HTN. - No OSA on sleep study 03/2021.   5. H/o tobacco abuse - She remains abstinent.   6. CAD - LAD 45%. - No chest pain. - Continue atorvastatin 20 mg daily.   7. H/o AKI - Labs today.   8. Medication non-compliance - Enrolled in paramedicine. - Gifted scale and pillbox today.   Follow up 01/13/2022 with NP/PA.    Audry Riles, PharmD, BCPS, BCCP, CPP Heart Failure Clinic Pharmacist [phone]

## 2021-12-27 ENCOUNTER — Other Ambulatory Visit: Payer: Self-pay

## 2021-12-27 ENCOUNTER — Ambulatory Visit (HOSPITAL_COMMUNITY)
Admission: RE | Admit: 2021-12-27 | Discharge: 2021-12-27 | Disposition: A | Discharge status: Home / Self Care | Payer: 59 | Source: Ambulatory Visit | Attending: Internal Medicine | Admitting: Internal Medicine

## 2021-12-27 ENCOUNTER — Other Ambulatory Visit (HOSPITAL_COMMUNITY): Payer: Self-pay

## 2021-12-27 VITALS — BP 190/90 | HR 69 | Wt 182.6 lb

## 2021-12-27 DIAGNOSIS — I5022 Chronic systolic (congestive) heart failure: Secondary | ICD-10-CM | POA: Diagnosis not present

## 2021-12-27 DIAGNOSIS — I340 Nonrheumatic mitral (valve) insufficiency: Secondary | ICD-10-CM | POA: Diagnosis not present

## 2021-12-27 DIAGNOSIS — I110 Hypertensive heart disease with heart failure: Secondary | ICD-10-CM | POA: Insufficient documentation

## 2021-12-27 DIAGNOSIS — Z87891 Personal history of nicotine dependence: Secondary | ICD-10-CM | POA: Diagnosis not present

## 2021-12-27 DIAGNOSIS — I429 Cardiomyopathy, unspecified: Secondary | ICD-10-CM

## 2021-12-27 DIAGNOSIS — I272 Pulmonary hypertension, unspecified: Secondary | ICD-10-CM | POA: Diagnosis not present

## 2021-12-27 DIAGNOSIS — I5042 Chronic combined systolic (congestive) and diastolic (congestive) heart failure: Secondary | ICD-10-CM

## 2021-12-27 DIAGNOSIS — I160 Hypertensive urgency: Secondary | ICD-10-CM | POA: Diagnosis not present

## 2021-12-27 DIAGNOSIS — I428 Other cardiomyopathies: Secondary | ICD-10-CM | POA: Diagnosis not present

## 2021-12-27 DIAGNOSIS — Z91148 Patient's other noncompliance with medication regimen for other reason: Secondary | ICD-10-CM | POA: Diagnosis not present

## 2021-12-27 DIAGNOSIS — I2510 Atherosclerotic heart disease of native coronary artery without angina pectoris: Secondary | ICD-10-CM | POA: Diagnosis not present

## 2021-12-27 MED ORDER — FUROSEMIDE 20 MG PO TABS
20.0000 mg | ORAL_TABLET | Freq: Every day | ORAL | 3 refills | Status: DC
  Filled 2021-12-27: qty 90, 90d supply, fill #0

## 2021-12-27 MED ORDER — CARVEDILOL 12.5 MG PO TABS
12.5000 mg | ORAL_TABLET | Freq: Two times a day (BID) | ORAL | 1 refills | Status: DC
  Filled 2021-12-27 – 2022-06-22 (×2): qty 60, 30d supply, fill #0

## 2021-12-27 MED ORDER — DAPAGLIFLOZIN PROPANEDIOL 10 MG PO TABS
10.0000 mg | ORAL_TABLET | Freq: Every day | ORAL | 3 refills | Status: DC
  Filled 2021-12-27: qty 90, 90d supply, fill #0

## 2021-12-27 MED ORDER — ISOSORBIDE MONONITRATE ER 30 MG PO TB24
60.0000 mg | ORAL_TABLET | Freq: Every day | ORAL | 3 refills | Status: DC
  Filled 2021-12-27: qty 180, 90d supply, fill #0

## 2021-12-27 MED ORDER — HYDRALAZINE HCL 100 MG PO TABS
100.0000 mg | ORAL_TABLET | Freq: Three times a day (TID) | ORAL | 3 refills | Status: DC
  Filled 2021-12-27: qty 270, 90d supply, fill #0

## 2021-12-27 MED ORDER — ATORVASTATIN CALCIUM 20 MG PO TABS
20.0000 mg | ORAL_TABLET | Freq: Every day | ORAL | 3 refills | Status: DC
  Filled 2021-12-27: qty 90, 90d supply, fill #0

## 2021-12-27 NOTE — Patient Instructions (Signed)
It was a pleasure seeing you today!  MEDICATIONS: -We are changing your medications today -Increase Imdur to 60 mg (2 tablets) daily -Increase hydralazine to 100 mg (1 tablet) three times daily -Call if you have questions about your medications.  LABS: -We will call you if your labs need attention.  NEXT APPOINTMENT: Return to clinic in 01/13/2022 with NP/PA.  In general, to take care of your heart failure: -Limit your fluid intake to 2 Liters (half-gallon) per day.   -Limit your salt intake to ideally 2-3 grams (2000-3000 mg) per day. -Weigh yourself daily and record, and bring that "weight diary" to your next appointment.  (Weight gain of 2-3 pounds in 1 day typically means fluid weight.) -The medications for your heart are to help your heart and help you live longer.   -Please contact us before stopping any of your heart medications.  Call the clinic at [phone] with questions or to reschedule future appointments.

## 2021-12-27 NOTE — Progress Notes (Signed)
Advanced Heart Failure Clinic Note   PCP: Vevelyn Francois, NP PCP-Cardiologist: Glori Bickers, MD  HF Cardiologist: Dr. Haroldine Laws   HPI: 51 y.o.AAF w/ h/o HTN and chronic systolic heart failure. In May 2016, she underwent left VATS w/ drainage of empyema/ abscess and decortication by Dr. Roxan Hockey. Echocardiogram showed normal EF 50-55%.    She was admitted again 02/2015 with gastroenteritis/sepsis and HTN urgency. Cardiology consulted for elevated troponin. Echo showed mildly reduced LVEF 40-45%, RV normal. LHC showed normal coronaries. Drop in EF felt to be stress induced. Unfortunately, she did not f/u w/ cardiology following that hospitalization.   Presented to ED on 11/20/2020 with CP and exertional dyspnea. She had stopped taking all of her BP meds ~4 months ago due to finances. In ED, BP 220/137. She was admitted and restarted on PO antihypertensives and IV Lasix. Echo showed severely reduced EF, down to 20-25%, GIIIDD, no LVH, Global HK, RV mildly reduced, RVSP 60 mmHg, mod MR, Mod TR. cMRI showed severely reduced LVEF, moderately reduced RV, small pericardial effusion. Findings consistent with HTN heart disease and pulmonary HTN. She was started on GDMT. Discharge weight 153 lbs.   Seen 02/2021, NYHA II, volume ok. Amlodipine was added for BP control. Lost to follow up.   Admitted 11/2021 with CP and hypertensive emergency, in setting of medication non-compliance. BP 198/131. Echo showed EF 20-25%, severe LV dysfunction with global HK, RV ok, circumferential pericardial effusion. She was given IV Lasix. Cardiology consulted and GDMT titrated. BP labile and with AKI, received IVF. Discharged home on carvedilol 12.5 mg BID, hydralazine 50 mg TID, Imdur 30 mg daily and Farxiga 10 mg daily; weight 167 lbs.   She returned on 12/02/2021 for post hospital HF follow up. Overall was feeling fine. She was not SOB with activity or lifting. Denied palpitations, chest pain, dizziness, edema, HA,  PND, and orthopnea. Her appetite was ok. She was not weighing herself at home. Living with her sister. She had not been taking [place] or Imdur and was taking extra carvedilol. Remains abstinent from smoking, ETOH or drug use.  Today she returns to HF clinic for pharmacist medication titration. At last visit with APP, she was restarted on Imdur 30 mg daily, Farxiga 10 mg daily, and atorvastatin 20 mg daily. She was also enrolled in paramedicine. Overall feeling fine today. She has a cold that started about last Thursday with cough and runny nose. She denies dizziness, chest pain, and fatigue.She does get lightheaded on occasion, but attributes this to stress and denies symptoms of orthostatic hypotension. She mentions one episode of palpitations about a week ago where she was doing activities, felt it and sat down and they went away. This was the only time since hospital discharge last month that she has felt palpitations. Her breathing is good overall and denies SOB even with exertion. She is able to complete all ADLs. She does not exercise. She does weigh at home and her weight has been consistent at 183-185 lbs. No LEE on exam. She has been taking her furosemide 20 mg daily instead of 40 mg PRN as prescribed as she says she feels better with it daily. She denies PND and her orthopnea has improved as she is down to 2 pillows at night. She has all medications with her except her atorvastatin, which she was not able to pick up. Her appetite has been good and she has been adherent to a low-salt diet and watching her fluid intake.   HF  Medications: Carvedilol 12.5 mg BID Farxiga 10 mg daily Imdur 30 mg daily Hydralazine 50 mg TID Furosemide 40 mg PRN - patient taking 20 mg daily   Has the patient been experiencing any side effects to the medications prescribed? No  Does the patient have any problems obtaining medications due to transportation or finances? [employer] health plan commercial insurance- can use  copay cards  Understanding of regimen: fair Understanding of indications: fair Potential of compliance: good Patient understands to avoid NSAIDs. Patient understands to avoid decongestants.   Pertinent Lab Values: Labs 12/02/2021: Serum creatinine 1.13, BUN 16, Potassium 5.1, Sodium 139, BNP 495.3  Vital Signs: Weight: 182.6 lbs (last clinic weight: 182 lbs) Blood pressure: 190/90 mmHg Heart rate: 69 bpm  Assessment/Plan: Chronic Systolic Heart Failure with chest pressure - NICM. LHC in 2016 showed normal coronaries - Echo 11/2014:  LVEF 50-55%, RV normal - Echo 02/2015: LVEF 40-45% (felt stress induced) AK of apical myocardium, G2DD, RV normal - Echo 12/2018: LVEF 40-45%, RV mildly reduced - Echo 12/2020: EF 20-25%, RV mildly reduced, in the setting of poorly controlled HTN/ hypertensive emergency  - [facility] 12/2020: with mild CAD. Suspect CM 2/2 HTN, RHC after diureses showed well compensated left sided filling pressures - cMRI 12/2020: LV EF 22% severely reduced, RVEF 35% moderately down, small circumferential pericardial effusion, findings consistent with hypertensive heart disease and pulmonary HTN. - Echo 11/2021: EF 20-25%, RV ok - NYHA II. Volume is good today.  - Continue furosemide 20 mg daily - patient has been taking this way - Continue carvedilol 12.5 mg BID.  - Continue Farxiga 10 mg daily. - Increase hydralazine to 100 mg TID. - Increase Imdur to 60 mg daily. - Repeat echo when GDMT optimized.   2. Hypertensive Urgency - Significantly elevated today.  - Medication changes as above - Given phone number for HF paramedic (Dede). Patient was referred to paramedicine last visit but they have not been able to reach her because her phone number changed. Number updated in Epic. They will reach out to her to schedule home visits and keep a close watch on her BP.    3. MR - Moderate on echo 12/2020, likely functional in setting of dilated LV/RV. - Mild on most recent echo 11/2021.    4. Pulmonary HTN - RVSP severely elevated on echo (12/2020), 60 mHg. - Suspect primarily 2/2 left sided heart disease/ acute CHF w/ elevated filling pressures. - RHC (12/2020) w/ mild to moderate mixed pulmonary HTN. - No OSA on sleep study 03/2021.   5. H/o tobacco abuse - She remains abstinent.   6. CAD - LAD 45%. - No chest pain. - Resume atorvastatin 20 mg daily.   7. H/o AKI - Labs today.   8. Medication non-compliance - Enrolled in paramedicine. Phone number updated.  - Paramedic phone number given to patient. - Paramedic updated on new phone number.  - Given scale and pillbox by clinic last visit   Follow up 01/13/2022 with NP/PA.   Audry Riles, PharmD, BCPS, BCCP, CPP Heart Failure Clinic Pharmacist [phone]

## 2021-12-29 ENCOUNTER — Encounter (HOSPITAL_COMMUNITY): Payer: Self-pay | Admitting: Emergency Medicine

## 2021-12-29 ENCOUNTER — Other Ambulatory Visit: Payer: Self-pay

## 2021-12-29 ENCOUNTER — Observation Stay (HOSPITAL_COMMUNITY)
Admission: EM | Admit: 2021-12-29 | Discharge: 2021-12-31 | Disposition: A | Discharge status: Home / Self Care | Payer: 59 | Attending: Family Medicine | Admitting: Family Medicine

## 2021-12-29 ENCOUNTER — Emergency Department (HOSPITAL_COMMUNITY): Payer: 59

## 2021-12-29 ENCOUNTER — Other Ambulatory Visit (HOSPITAL_COMMUNITY): Payer: Self-pay | Admitting: Emergency Medicine

## 2021-12-29 DIAGNOSIS — E7849 Other hyperlipidemia: Secondary | ICD-10-CM

## 2021-12-29 DIAGNOSIS — I10 Essential (primary) hypertension: Secondary | ICD-10-CM | POA: Diagnosis present

## 2021-12-29 DIAGNOSIS — N189 Chronic kidney disease, unspecified: Secondary | ICD-10-CM | POA: Diagnosis not present

## 2021-12-29 DIAGNOSIS — R079 Chest pain, unspecified: Secondary | ICD-10-CM | POA: Diagnosis present

## 2021-12-29 DIAGNOSIS — I160 Hypertensive urgency: Secondary | ICD-10-CM | POA: Diagnosis not present

## 2021-12-29 DIAGNOSIS — R0789 Other chest pain: Secondary | ICD-10-CM | POA: Diagnosis present

## 2021-12-29 DIAGNOSIS — Z79899 Other long term (current) drug therapy: Secondary | ICD-10-CM | POA: Insufficient documentation

## 2021-12-29 DIAGNOSIS — Z7984 Long term (current) use of oral hypoglycemic drugs: Secondary | ICD-10-CM | POA: Insufficient documentation

## 2021-12-29 DIAGNOSIS — Z87891 Personal history of nicotine dependence: Secondary | ICD-10-CM | POA: Diagnosis not present

## 2021-12-29 DIAGNOSIS — I130 Hypertensive heart and chronic kidney disease with heart failure and stage 1 through stage 4 chronic kidney disease, or unspecified chronic kidney disease: Secondary | ICD-10-CM | POA: Insufficient documentation

## 2021-12-29 DIAGNOSIS — N1831 Chronic kidney disease, stage 3a: Secondary | ICD-10-CM | POA: Insufficient documentation

## 2021-12-29 DIAGNOSIS — I5042 Chronic combined systolic (congestive) and diastolic (congestive) heart failure: Secondary | ICD-10-CM | POA: Diagnosis present

## 2021-12-29 DIAGNOSIS — E785 Hyperlipidemia, unspecified: Secondary | ICD-10-CM | POA: Diagnosis present

## 2021-12-29 DIAGNOSIS — I5022 Chronic systolic (congestive) heart failure: Secondary | ICD-10-CM | POA: Diagnosis not present

## 2021-12-29 LAB — BASIC METABOLIC PANEL
Anion gap: 8 (ref 5–15)
BUN: 20 mg/dL (ref 6–20)
CO2: 23 mmol/L (ref 22–32)
Calcium: 8.9 mg/dL (ref 8.9–10.3)
Chloride: 104 mmol/L (ref 98–111)
Creatinine, Ser: 1.23 mg/dL — ABNORMAL HIGH (ref 0.44–1.00)
GFR, Estimated: 53 mL/min — ABNORMAL LOW (ref >60)
Glucose, Bld: 116 mg/dL — ABNORMAL HIGH (ref 70–99)
Potassium: 3.8 mmol/L (ref 3.5–5.1)
Sodium: 135 mmol/L (ref 135–145)

## 2021-12-29 LAB — CBC
HCT: 39.8 % (ref 36.0–46.0)
Hemoglobin: 12.8 g/dL (ref 12.0–15.0)
MCH: 26.7 pg (ref 26.0–34.0)
MCHC: 32.2 g/dL (ref 30.0–36.0)
MCV: 82.9 fL (ref 80.0–100.0)
Platelets: 226 10*3/uL (ref 150–400)
RBC: 4.8 MIL/uL (ref 3.87–5.11)
RDW: 14.8 % (ref 11.5–15.5)
WBC: 6.8 10*3/uL (ref 4.0–10.5)
nRBC: 0 % (ref 0.0–0.2)

## 2021-12-29 LAB — TROPONIN I (HIGH SENSITIVITY)
Troponin I (High Sensitivity): 11 ng/L (ref <18)
Troponin I (High Sensitivity): 12 ng/L (ref <18)

## 2021-12-29 LAB — BRAIN NATRIURETIC PEPTIDE: B Natriuretic Peptide: 58.9 pg/mL (ref 0.0–100.0)

## 2021-12-29 LAB — LIPASE, BLOOD: Lipase: 24 U/L (ref 11–51)

## 2021-12-29 MED ORDER — ALUM & MAG HYDROXIDE-SIMETH 200-200-20 MG/5ML PO SUSP
30.0000 mL | Freq: Once | ORAL | Status: AC
  Administered 2021-12-29: 30 mL via ORAL
  Filled 2021-12-29: qty 30

## 2021-12-29 MED ORDER — HYDRALAZINE HCL 20 MG/ML IJ SOLN
10.0000 mg | Freq: Once | INTRAMUSCULAR | Status: AC
  Administered 2021-12-29: 10 mg via INTRAVENOUS
  Filled 2021-12-29: qty 1

## 2021-12-29 NOTE — Subjective & Objective (Signed)
Presents with sudden lside CP radiating to L shoulder, diaphoretic EMS was called she was given 324 asa and 4 SL NTG. Pain from 8/10 to 3/10. Pt endorses similar sx yesterday that went away without intervention. BP was . 157/82, HR 67, 99% Recently admitted 11/2021 with CP and hypertensive emergency, in setting of medication non-compliance. BP 198/131. Echo showed EF 20-25%, severe LV dysfunction with global HK, RV ok, circumferential pericardial effusion. She was given IV Lasix. Cardiology consulted and GDMT titrated. BP labile and with AKI, received IVF. Discharged home on carvedilol 12.5 mg BID, hydralazine 50 mg TID, Imdur 30 mg daily and Farxiga 10 mg daily; weight 167 lbs. Seen in the office on 12/02/2021 She had not been taking [place] or Imdur and was taking extra carvedilol. Remains abstinent from smoking, ETOH or drug use. Came back to CHF clinic 12/27/2021 She has been taking her furosemide 20 mg daily  Appeared stable at the time weight has been consistent at 183-185 lbs.

## 2021-12-29 NOTE — Assessment & Plan Note (Signed)
Somewhat atypical troponins unremarkable EKG nonischemic cardiology is aware we will continue to monitor Patient have had recent echogram Control BP

## 2021-12-29 NOTE — Progress Notes (Signed)
Paramedicine Encounter    Patient ID: Emily Key, female    DOB: 02/12/1971, 51 y.o.   MRN: [id number]  Initial para-medicine visit with Emily Key.  She presented A&O x 4, skin W&D w/ good color.  Explained para-medicine program and available resources. When asking about chest pain she advised she had an episode of "chest pain, breaking out into a sweat and feeling very nauseas."  She rated the pain 10/10 on pain scale and said it lasted about 10 minutes then went away.  She stated that the pain began at rest while sitting in her car.  As we continued our visit I went outside to get a scale for her weight.  When I came back in I noticed she suddenly began sweating profusely and when I asked her if she was alright she stated she was having "that pain again."  She rates it 8/10 on pain scale and stated she felt sick on her stomach but did not vomit.  I placed pt on cardiac monitor showing sinus rhythm.  12-lead revealed no obvious ST elevation but she did have some ischemia.  4 - '81mg'$ . ASA were given and IV established prior to administering 1 Ntg SL.  ALS unit requested for pt transport to [facility].   I was in the middle of med rec when this incident began.  Pt needed refills on her meds so I took her pill box, went by [facility] and picked up the rest of her medicines and took them to [facility] ER.  Completed med rec and delivered all meds and pill box to her in room 33 of ED.    BP 140/90 (BP Location: Right Arm, Patient Position: Sitting, Cuff Size: Normal)   Pulse 67   Resp 14   Wt 179 lb 9.6 oz (81.5 kg)   SpO2 98%   BMI 28.13 kg/m  Weight yesterday-not taken Last visit weight-182 lb  Patient Care Team: Vevelyn Francois, NP as PCP - General (Adult Health Nurse Practitioner) Haroldine Laws, Shaune Pascal, MD as PCP - Advanced Heart Failure (Cardiology) Bensimhon, Shaune Pascal, MD as PCP - Cardiology (Cardiology)  Patient Active Problem List   Diagnosis Date Noted   AKI (acute kidney injury)  [facility])    Abdominal pain, epigastric    CHF (congestive heart failure) ([place]) 11/19/2021   Cardiomyopathy ([place]) 12/22/2020   Vitamin D deficiency 02/22/2019   Bilateral impacted cerumen 01/21/2019   Seasonal allergies 01/21/2019   Hypertensive emergency 01/02/2019   Marijuana abuse 01/02/2019   Tobacco dependence 01/02/2019   Hyperglycemia 01/02/2019   Acute on chronic systolic CHF (congestive heart failure) ([place]) 01/02/2019   Seizure ([place]) 01/02/2019   Adnexal mass 02/18/2015   Elevated troponin 02/14/2015   Empyema lung (HCC)    H/O non-ST elevation myocardial infarction (NSTEMI)    Substance abuse ([place])    Accelerated hypertension 01/30/2015   Chronic kidney disease 11/28/2014   Hypertension 11/26/2014   Hypertensive urgency 11/11/2014    Current Outpatient Medications:    atorvastatin (LIPITOR) 20 MG tablet, Take 1 tablet (20 mg total) by mouth daily., Disp: 90 tablet, Rfl: 3   carvedilol (COREG) 12.5 MG tablet, Take 1 tablet (12.5 mg total) by mouth 2 (two) times daily with a meal., Disp: 60 tablet, Rfl: 1   dapagliflozin propanediol (FARXIGA) 10 MG TABS tablet, Take 1 tablet (10 mg total) by mouth daily., Disp: 90 tablet, Rfl: 3   furosemide (LASIX) 20 MG tablet, Take 1 tablet (20 mg total) by  mouth daily., Disp: 90 tablet, Rfl: 3   hydrALAZINE (APRESOLINE) 100 MG tablet, Take 1 tablet (100 mg total) by mouth 3 (three) times daily., Disp: 270 tablet, Rfl: 3   isosorbide mononitrate (IMDUR) 30 MG 24 hr tablet, Take 2 tablets (60 mg total) by mouth daily., Disp: 180 tablet, Rfl: 3   citalopram (CELEXA) 10 MG tablet, Take 1 tablet (10 mg total) by mouth daily., Disp: 30 tablet, Rfl: 0 No Known Allergies    Social History   Socioeconomic History   Marital status: Divorced    Spouse name: Not on file   Number of children: Not on file   Years of education: Not on file   Highest education level: Not on file  Occupational History   Not on file  Tobacco Use   Smoking status:  Former    Packs/day: 0.33    Years: 10.00    Total pack years: 3.30    Types: Cigarettes    Quit date: 11/11/2014    Years since quitting: 7.1   Smokeless tobacco: Never  Substance and Sexual Activity   Alcohol use: Yes    Alcohol/week: 0.0 standard drinks of alcohol    Comment: 8/1/20176 "a couple beers a couple times/month"   Drug use: No   Sexual activity: Yes  Other Topics Concern   Not on file  Social History Narrative   Not on file   Social Determinants of Health   Financial Resource Strain: High Risk (12/23/2020)   Overall Financial Resource Strain (CARDIA)    Difficulty of Paying Living Expenses: Hard  Food Insecurity: No Food Insecurity (12/08/2021)   Hunger Vital Sign    Worried About Running Out of Food in the Last Year: Never true    Ran Out of Food in the Last Year: Never true  Transportation Needs: Unmet Transportation Needs (12/08/2021)   PRAPARE - Hydrologist (Medical): Yes    Lack of Transportation (Non-Medical): Yes  Physical Activity: Not on file  Stress: Not on file  Social Connections: Not on file  Intimate Partner Violence: Not on file    Physical Exam      Future Appointments  Date Time Provider [place]  01/13/2022  3:00 PM [facility] PA/NP [facility] None  03/10/2022  1:00 PM [place] ECHO OP 1 [facility]  03/10/2022  2:00 PM Bensimhon, Shaune Pascal, MD [place] None       Renee Ramus, [place] [facility] Paramedic  12/29/21

## 2021-12-29 NOTE — ED Triage Notes (Signed)
Pt BIB [facility] for sudden onset L sided CP that radiates to L shoulder pain. Pt diaphoretic with pain. Given 324 asa and 4 SL NTG. Pain from 8/10 to 3/10. Pt endorses similar sx yesterday that went away without intervention. Hx CHF and cardiac cath, no hx MI. 157/82, HR 67, 99%

## 2021-12-29 NOTE — H&P (Signed)
Emily Key JQB:[id number] DOB: [birth year] DOA: 12/29/2021   PCP: Vevelyn Francois, NP   Outpatient Specialists:  CARDS:   Dr. Haroldine Laws    Patient arrived to ER on 12/29/21 at 1427 Referred by Attending Davonna Belling, MD   Patient coming from:    home Lives  With family    Chief Complaint: Chest pain    HPI: Emily Key is a 51 y.o. female with medical history significant of systolic CHF, HTN     Presented with  Chest pain Presents with sudden lside CP radiating to L shoulder, diaphoretic EMS was called she was given 324 asa and 4 SL NTG. Pain from 8/10 to 3/10. Pt endorses similar sx yesterday that went away without intervention. BP was . 157/82, HR 67, 99% Recently admitted 11/2021 with CP and hypertensive emergency, in setting of medication non-compliance. BP 198/131. Echo showed EF 20-25%, severe LV dysfunction with global HK, RV ok, circumferential pericardial effusion. She was given IV Lasix. Cardiology consulted and GDMT titrated. BP labile and with AKI, received IVF. Discharged home on carvedilol 12.5 mg BID, hydralazine 50 mg TID, Imdur 30 mg daily and Farxiga 10 mg daily; weight 167 lbs. Seen in the office on 12/02/2021 She had not been taking [place] or Imdur and was taking extra carvedilol. Remains abstinent from smoking, ETOH or drug use. Came back to CHF clinic 12/27/2021 She has been taking her furosemide 20 mg daily  Appeared stable at the time weight has been consistent at 183-185 lbs.  Non pleuritic CP has improved still a little pressure States has been taking her meds and quit smoking   Regarding pertinent Chronic problems:   Hyperlipidemia -  on statins Lipitor Lipid Panel     Component Value Date/Time   CHOL 223 (H) 02/05/2021 1008   TRIG 79 02/05/2021 1008   HDL 82 02/05/2021 1008   CHOLHDL 2.7 02/05/2021 1008   LDLCALC 127 (H) 02/05/2021 1008   LABVLDL 14 02/05/2021 1008    HTN on coreg ,   chronic CHF diastolic/systolic/ combined -  last echo may 2023 EF 20-25% Lasix '20mg'$  po qd     CKD stage IIIa- baseline Cr 1.2 Estimated Creatinine Clearance: 58.8 mL/min (A) (by C-G formula based on SCr of 1.23 mg/dL (H)).  Lab Results  Component Value Date   CREATININE 1.23 (H) 12/29/2021   CREATININE 1.13 (H) 12/02/2021   CREATININE 1.21 (H) 11/25/2021    While in ER: Clinical Course as of 12/29/21 2217  Wed Dec 29, 2021  1445 EKG 12-Lead [MC]    Clinical Course User Index [MC] Carp, Hosie Spangle, Student-PA     Ordered  CXR -  NON acute      Following Medications were ordered in ER: Medications  alum & mag hydroxide-simeth (MAALOX/MYLANTA) 200-200-20 MG/5ML suspension 30 mL (30 mLs Oral Given 12/29/21 1801)  hydrALAZINE (APRESOLINE) injection 10 mg (10 mg Intravenous Given 12/29/21 2003)    _______________________________________________________ ER Provider Called:  Cardiology   Dr. Corie Chiquito They Recommend admit to medicine  Will see in AM     ED Triage Vitals  Enc Vitals Group     BP 12/29/21 1432 (!) 155/92     Pulse Rate 12/29/21 1432 65     Resp 12/29/21 1432 16     Temp 12/29/21 1432 98.3 F (36.8 C)     Temp Source 12/29/21 1432 Oral     SpO2 12/29/21 1432 99 %     Weight --  Height --      Head Circumference --      Peak Flow --      Pain Score 12/29/21 1438 3     Pain Loc --      Pain Edu? --      Excl. in [place]? --   TMAX(24)@     _________________________________________ Significant initial  Findings: Abnormal Labs Reviewed  BASIC METABOLIC PANEL - Abnormal; Notable for the following components:      Result Value   Glucose, Bld 116 (*)    Creatinine, Ser 1.23 (*)    GFR, Estimated 53 (*)    All other components within normal limits    _________________________ Troponin 11 -12 ECG: Ordered Personally reviewed by me showing: HR : 60 Rhythm: Borderline prolonged PR interval Left atrial enlargement Nonspecific IVCD with LAD LVH with secondary repolarization abnormality QTC  443  The recent clinical data is shown below. Vitals:   12/29/21 1945 12/29/21 2000 12/29/21 2045 12/29/21 2130  BP: (!) 176/93 (!) 180/91 (!) 178/95 (!) 187/92  Pulse: (!) 56 (!) 57 (!) 59 61  Resp: '19 19 20 18  '$ Temp:      TempSrc:      SpO2: 99% 100% 100% 100%     WBC     Component Value Date/Time   WBC 6.8 12/29/2021 1438   LYMPHSABS 2.5 11/25/2021 0240   MONOABS 0.7 11/25/2021 0240   EOSABS 0.2 11/25/2021 0240   BASOSABS 0.0 11/25/2021 0240        UA  ordered  _________________________________________ Hospitalist was called for admission for hypertensive urgency , ongoing chest pain    The following Work up has been ordered so far:  Orders Placed This Encounter  Procedures   DG Chest [facility] [employer] 1 View   Basic metabolic panel   CBC   Brain natriuretic peptide   Lipase, blood   Cardiac monitoring   Inpatient consult to Cardiology   Consult to hospitalist   Pulse oximetry, continuous   ED EKG   EKG 12-Lead   Insert peripheral IV     OTHER Significant initial  Findings:  labs showing:    Recent Labs  Lab 12/29/21 1438  NA 135  K 3.8  CO2 23  GLUCOSE 116*  BUN 20  CREATININE 1.23*  CALCIUM 8.9    Cr  stable,    Lab Results  Component Value Date   CREATININE 1.23 (H) 12/29/2021   CREATININE 1.13 (H) 12/02/2021   CREATININE 1.21 (H) 11/25/2021    No results for input(s): "AST", "ALT", "ALKPHOS", "BILITOT", "PROT", "ALBUMIN" in the last 168 hours. Lab Results  Component Value Date   CALCIUM 8.9 12/29/2021   PHOS 4.4 01/04/2019       Plt: Lab Results  Component Value Date   PLT 226 12/29/2021     COVID-19 Labs  No results for input(s): "DDIMER", "FERRITIN", "LDH", "CRP" in the last 72 hours.  Lab Results  Component Value Date   SARSCOV2NAA NEGATIVE 12/23/2020   [place] NEGATIVE 01/02/2019         Recent Labs  Lab 12/29/21 1438  WBC 6.8  HGB 12.8  HCT 39.8  MCV 82.9  PLT 226    HG/HCT   stable,       Component Value  Date/Time   HGB 12.8 12/29/2021 1438   HCT 39.8 12/29/2021 1438   MCV 82.9 12/29/2021 1438     Recent Labs  Lab 12/29/21 1530  LIPASE 24    Cardiac  Panel (last 3 results) No results for input(s): "CKTOTAL", "CKMB", "TROPONINI", "RELINDX" in the last 72 hours.  .car BNP (last 3 results) Recent Labs    11/22/21 0110 12/02/21 1237 12/29/21 1438  BNP 36.8 495.3* 58.9    DM  labs:  HbA1C: Recent Labs    11/20/21 0341  HGBA1C 6.1*      CBG (last 3)  No results for input(s): "GLUCAP" in the last 72 hours.    Cultures:    Component Value Date/Time   SDES URINE, RANDOM 02/10/2015 0716   SPECREQUEST NONE 02/10/2015 0716   CULT NO GROWTH 1 DAY 02/10/2015 0716   REPTSTATUS 02/11/2015 FINAL 02/10/2015 0716     Radiological Exams on Admission: DG Chest Port 1 View  Result Date: 12/29/2021 CLINICAL DATA:  Chest pain EXAM: PORTABLE CHEST 1 VIEW COMPARISON:  Chest x-ray dated Nov 19, 2021 FINDINGS: Cardiac contours are upper limits of normal in size, unchanged when compared to prior. Mild left basilar atelectasis, lungs otherwise clear. The visualized skeletal structures are unremarkable. IMPRESSION: No acute cardiopulmonary abnormality. Electronically Signed   By: Yetta Glassman M.D.   On: 12/29/2021 14:48   _______________________________________________________________________________________________________ Latest  Blood pressure (!) 187/92, pulse 61, temperature 98.3 F (36.8 C), temperature source Oral, resp. rate 18, SpO2 100 %.   Vitals  labs and radiology finding personally reviewed  Review of Systems:    Pertinent positives include:  chest pain,   Constitutional:  No weight loss, night sweats, Fevers, chills, fatigue, weight loss  HEENT:  No headaches, Difficulty swallowing,Tooth/dental problems,Sore throat,  No sneezing, itching, ear ache, nasal congestion, post nasal drip,  Cardio-vascular:  No Orthopnea, PND, anasarca, dizziness, palpitations.no Bilateral  lower extremity swelling  GI:  No heartburn, indigestion, abdominal pain, nausea, vomiting, diarrhea, change in bowel habits, loss of appetite, melena, blood in stool, hematemesis Resp:  no shortness of breath at rest. No dyspnea on exertion, No excess mucus, no productive cough, No non-productive cough, No coughing up of blood.No change in color of mucus.No wheezing. Skin:  no rash or lesions. No jaundice GU:  no dysuria, change in color of urine, no urgency or frequency. No straining to urinate.  No flank pain.  Musculoskeletal:  No joint pain or no joint swelling. No decreased range of motion. No back pain.  Psych:  No change in mood or affect. No depression or anxiety. No memory loss.  Neuro: no localizing neurological complaints, no tingling, no weakness, no double vision, no gait abnormality, no slurred speech, no confusion  All systems reviewed and apart from [place] all are negative _______________________________________________________________________________________________ Past Medical History:   Past Medical History:  Diagnosis Date   Cerumen impaction 01/2019   CHF (congestive heart failure) ([place])    after last pregnancy   Daily headache    Heart murmur    "born w/one":   History of hiatal hernia    Hyperlipidemia 01/2019   Hypertension    Hypertensive emergency 01/02/2019   Observed seizure-like activity ([place]) 01/02/2019   Pneumonia 11/2014   Seasonal allergies 01/2019   Vitamin D deficiency 01/2019     Past Surgical History:  Procedure Laterality Date   CARDIAC CATHETERIZATION N/A 02/13/2015   Procedure: Left Heart Cath and Coronary Angiography;  Surgeon: Troy Sine, MD;  Location: [facility];  Service: Cardiovascular;  Laterality: N/A;   PLEURAL EFFUSION DRAINAGE Left 11/14/2014   Procedure: DRAINAGE OF LEFT PLEURAL EFFUSION;  Surgeon: Melrose Nakayama, MD;  Location: [facility];  Service:  Thoracic;  Laterality: Left;   RIGHT/LEFT HEART CATH AND  CORONARY ANGIOGRAPHY N/A 12/24/2020   Procedure: RIGHT/LEFT HEART CATH AND CORONARY ANGIOGRAPHY;  Surgeon: Jolaine Artist, MD;  Location: [facility];  Service: Cardiovascular;  Laterality: N/A;   TONSILLECTOMY AND ADENOIDECTOMY  ~ [place]   VIDEO ASSISTED THORACOSCOPY (VATS)/DECORTICATION Left 11/14/2014   Procedure: LEFT VIDEO ASSISTED THORACOSCOPY (VATS)/DECORTICATION;  Surgeon: Melrose Nakayama, MD;  Location: [facility];  Service: Thoracic;  Laterality: Left;    Social History:  Ambulatory   independently      reports that she quit smoking about 7 years ago. Her smoking use included cigarettes. She has a 3.30 pack-year smoking history. She has never used smokeless tobacco. She reports current alcohol use. She reports that she does not use drugs.     Family History:   Family History  Problem Relation Age of Onset   Diabetes Mellitus II Mother    Hypertension Mother    Lung cancer Father    ______________________________________________________________________________________________ Allergies: No Known Allergies   Prior to Admission medications   Medication Sig Start Date End Date Taking? Authorizing Provider  atorvastatin (LIPITOR) 20 MG tablet Take 1 tablet (20 mg total) by mouth daily. 12/27/21   Bensimhon, Shaune Pascal, MD  carvedilol (COREG) 12.5 MG tablet Take 1 tablet (12.5 mg total) by mouth 2 (two) times daily with a meal. 12/27/21   Bensimhon, Shaune Pascal, MD  citalopram (CELEXA) 10 MG tablet Take 1 tablet (10 mg total) by mouth daily. 11/26/21   Little Ishikawa, MD  dapagliflozin propanediol (FARXIGA) 10 MG TABS tablet Take 1 tablet (10 mg total) by mouth daily. 12/27/21   Bensimhon, Shaune Pascal, MD  furosemide (LASIX) 20 MG tablet Take 1 tablet (20 mg total) by mouth daily. 12/27/21   Bensimhon, Shaune Pascal, MD  hydrALAZINE (APRESOLINE) 100 MG tablet Take 1 tablet (100 mg total) by mouth 3 (three) times daily. 12/27/21   Bensimhon, Shaune Pascal, MD   isosorbide mononitrate (IMDUR) 30 MG 24 hr tablet Take 2 tablets (60 mg total) by mouth daily. 12/27/21   Bensimhon, Shaune Pascal, MD    ___________________________________________________________________________________________________ Physical Exam:    12/29/2021    9:30 PM 12/29/2021    8:45 PM 12/29/2021    8:00 PM  Vitals with BMI  Systolic [phone]  Diastolic 92 95 91  Pulse 61 59 57     1. General:  in No  Acute distress   Chronically ill  -appearing 2. Psychological: Alert and   Oriented 3. Head/ENT:   Moist   Mucous Membranes                          Head Non traumatic, neck supple                          Poor Dentition 4. SKIN:   decreased Skin turgor,  Skin clean Dry and intact no rash 5. Heart: Regular rate and rhythm systolic Murmur, no Rub or gallop 6. Lungs:  no wheezes or crackles   7. Abdomen: Soft, epigastric -tender, Non distended  bowel sounds present 8. Lower extremities: no clubbing, cyanosis, no  edema 9. Neurologically Grossly intact, moving all 4 extremities equally  10. MSK: Normal range of motion    Chart has been reviewed  ______________________________________________________________________________________________  Assessment/Plan  51 y.o. female with medical history significant of systolic  CHF, HTN    Admitted for hypertensive urgency   Present on Admission:  Hypertensive urgency  Chronic systolic CHF (congestive heart failure) ([facility])  Hypertension  Chest pain  Chronic kidney disease  Hyperlipidemia     Chronic systolic CHF (congestive heart failure) ([place]) Appears to be euvolemic at this time continue home medications appreciate cardiology input  Hypertension Chronic continue home medications Will restart  Coreg 12.5 mg twice daily, Lasix 20 mg a day, hydralazine 100 mg 3 times daily, Imdur 60 mg daily  Hypertensive urgency Noted elevated blood pressure in the setting of chest pain and EKG nonischemic troponin unremarkable.   Continue home meds and monitor carefully blood pressure  Chest pain Somewhat atypical troponins unremarkable EKG nonischemic cardiology is aware we will continue to monitor Patient have had recent echogram Control BP   Chronic kidney disease  -chronic avoid nephrotoxic medications such as NSAIDs, Vanco Zosyn combo,  avoid hypotension, continue to follow renal function   Hyperlipidemia Continue Lipitor 20 mg a day    Other plan as per orders.  DVT prophylaxis:   Lovenox      Code Status:    Code Status: Prior FULL CODE   as per patient   I had personally discussed CODE STATUS with patient    Family Communication:   Family not at  Bedside    Disposition Plan:   To home once workup is complete and patient is stable   Following barriers for discharge:                                                        Pain controlled with PO medications                                                        Will need consultants to evaluate patient prior to discharge                      Consults called:    cardiology is aware  Admission status:  ED Disposition     ED Disposition  [place]: [place] [[id number]]  Level of Care: Progressive [102]  Admit to Progressive based on following criteria: CARDIOVASCULAR & THORACIC of moderate stability with acute coronary syndrome symptoms/low risk myocardial infarction/hypertensive urgency/arrhythmias/heart failure potentially compromising stability and stable post cardiovascular intervention patients.  May place patient in observation at [facility] or [place] if equivalent level of care is available:: No  Covid Evaluation: Asymptomatic - no recent exposure (last 10 days) testing not required  Diagnosis: Hypertensive urgency [[id number]]  Admitting Physician: Toy Baker [[id number]]  Attending Physician: Toy Baker [[id number]]           Obs    Level of care     progressive  tele indefinitely please discontinue once patient no longer qualifies COVID-19 Labs   Emily Key 12/30/2021, 12:54 AM    [facility]     after 2 AM please page floor coverage PA If 7AM-7PM, please contact the day team taking care of the patient using [email or web address]  Patient was evaluated in the context of the global COVID-19 pandemic, which necessitated consideration that the patient might be at risk for infection with the SARS-CoV-2 virus that causes COVID-19. Institutional protocols and algorithms that pertain to the evaluation of patients at risk for COVID-19 are in a state of rapid change based on information released by regulatory bodies including the CDC and federal and state organizations. These policies and algorithms were followed during the patient's care.

## 2021-12-29 NOTE — Assessment & Plan Note (Signed)
Appears to be euvolemic at this time continue home medications appreciate cardiology input

## 2021-12-29 NOTE — Assessment & Plan Note (Addendum)
Chronic continue home medications Will restart  Coreg 12.5 mg twice daily, Lasix 20 mg a day, hydralazine 100 mg 3 times daily, Imdur 60 mg daily

## 2021-12-29 NOTE — Assessment & Plan Note (Signed)
Noted elevated blood pressure in the setting of chest pain and EKG nonischemic troponin unremarkable.  Continue home meds and monitor carefully blood pressure

## 2021-12-29 NOTE — ED Provider Notes (Cosign Needed)
[facility] EMERGENCY DEPARTMENT Provider Note   CSN: [id number] Arrival date & time: 12/29/21  1427     History No chief complaint on file.   Nakoma Gotwalt is a 51 y.o. female with history of CHF, hypertensive emergency, noncompliance, hyperlipidemia, CKD presents the emergency department for evaluation of chest pain.  Yesterday, patient reports that she was sitting in her car and had an episode of left-sided chest pain that lasted around 10 to 15 minutes.  She reports that she was "covered in sweat".  She denies any nausea at this time and reports that it resolved and she felt better.  She denies any shortness of breath or lightheadedness at the time as well.  Today around 1300, she had left-sided chest pain reports that it lasted longer, except this time it radiated to her back and down her left arm.  She reports was described as sharp in nature.  She was seen by home health and was told to come to the emergency department as she was diaphoretic.  She denies any nausea, vomiting, abdominal pain, lightheadedness, or shortness of breath again.  She reports that they gave her some nitrogen her chest pain went from an 8 out of 10 to a 3 out of 10.  She reports is not as sharp, but is still achy however.  She received 4 sublingual nitroglycerin and 324 of aspirin via EMS.  Patient reports compliancy with all her medications.  Marijuana user.  HPI     Home Medications Prior to Admission medications   Medication Sig Start Date End Date Taking? Authorizing Provider  atorvastatin (LIPITOR) 20 MG tablet Take 1 tablet (20 mg total) by mouth daily. 12/27/21   Bensimhon, Shaune Pascal, MD  carvedilol (COREG) 12.5 MG tablet Take 1 tablet (12.5 mg total) by mouth 2 (two) times daily with a meal. 12/27/21   Bensimhon, Shaune Pascal, MD  citalopram (CELEXA) 10 MG tablet Take 1 tablet (10 mg total) by mouth daily. 11/26/21   Little Ishikawa, MD  dapagliflozin propanediol (FARXIGA) 10 MG TABS  tablet Take 1 tablet (10 mg total) by mouth daily. 12/27/21   Bensimhon, Shaune Pascal, MD  furosemide (LASIX) 20 MG tablet Take 1 tablet (20 mg total) by mouth daily. 12/27/21   Bensimhon, Shaune Pascal, MD  hydrALAZINE (APRESOLINE) 100 MG tablet Take 1 tablet (100 mg total) by mouth 3 (three) times daily. 12/27/21   Bensimhon, Shaune Pascal, MD  isosorbide mononitrate (IMDUR) 30 MG 24 hr tablet Take 2 tablets (60 mg total) by mouth daily. 12/27/21   Bensimhon, Shaune Pascal, MD      Allergies    Patient has no known allergies.    Review of Systems   Review of Systems  Constitutional:  Positive for diaphoresis. Negative for chills and fever.  HENT:  Negative for congestion and rhinorrhea.   Respiratory:  Negative for cough and shortness of breath.   Cardiovascular:  Positive for chest pain. Negative for palpitations and leg swelling.  Gastrointestinal:  Negative for abdominal pain, constipation, diarrhea, nausea and vomiting.  Genitourinary:  Negative for dysuria and hematuria.  Musculoskeletal:  Negative for back pain.  Neurological:  Negative for dizziness, syncope, weakness and light-headedness.    Physical Exam Updated Vital Signs BP (!) 179/100   Pulse 66   Temp 98.3 F (36.8 C) (Oral)   Resp 16   SpO2 98%  Physical Exam Vitals and nursing note reviewed.  Constitutional:      General: She is not  in acute distress.    Appearance: Normal appearance. She is not toxic-appearing or diaphoretic.     Comments: Patient is well-appearing and not diaphoretic  HENT:     Head: Normocephalic and atraumatic.     Mouth/Throat:     Mouth: Mucous membranes are moist.  Eyes:     General: No scleral icterus.    Extraocular Movements: Extraocular movements intact.     Pupils: Pupils are equal, round, and reactive to light.  Cardiovascular:     Rate and Rhythm: Normal rate and regular rhythm.  Pulmonary:     Effort: Pulmonary effort is normal. No respiratory distress.     Breath sounds: Normal breath sounds.      Comments: Lungs are clear to auscultation bilaterally.  No respiratory distress, accessory muscle use, nasal flaring, cyanosis, or tripoding present.  Patient is speaking in full sentences with ease and is satting well on room air with any increased work of breathing. Abdominal:     General: Bowel sounds are normal.     Palpations: Abdomen is soft.     Tenderness: There is abdominal tenderness. There is no guarding or rebound.     Comments: Epigastric tenderness without guarding or rebound.  Normal active bowel sounds.  Musculoskeletal:        General: No deformity.     Cervical back: Normal range of motion. No tenderness.     Right lower leg: No edema.     Left lower leg: No edema.  Skin:    General: Skin is warm and dry.  Neurological:     General: No focal deficit present.     Mental Status: She is alert. Mental status is at baseline.     Cranial Nerves: No cranial nerve deficit.     ED Results / Procedures / Treatments   Labs (all labs ordered are listed, but only abnormal results are displayed) Labs Reviewed  BASIC METABOLIC PANEL - Abnormal; Notable for the following components:      Result Value   Glucose, Bld 116 (*)    Creatinine, Ser 1.23 (*)    GFR, Estimated 53 (*)    All other components within normal limits  CBC  BRAIN NATRIURETIC PEPTIDE  LIPASE, BLOOD  TROPONIN I (HIGH SENSITIVITY)  TROPONIN I (HIGH SENSITIVITY)    EKG EKG Interpretation  Date/Time:  Wednesday December 29 2021 14:36:59 EDT Ventricular Rate:  60 PR Interval:  207 QRS Duration: 125 QT Interval:  443 QTC Calculation: 443 R Axis:   -72 Text Interpretation: Sinus rhythm Borderline prolonged PR interval Left atrial enlargement Nonspecific IVCD with LAD LVH with secondary repolarization abnormality Anterior Q waves, possibly due to LVH Confirmed by Davonna Belling [phone]) on 12/29/2021 3:48:53 PM  Radiology DG Chest Port 1 View  Result Date: 12/29/2021 CLINICAL DATA:  Chest pain EXAM:  PORTABLE CHEST 1 VIEW COMPARISON:  Chest x-ray dated Nov 19, 2021 FINDINGS: Cardiac contours are upper limits of normal in size, unchanged when compared to prior. Mild left basilar atelectasis, lungs otherwise clear. The visualized skeletal structures are unremarkable. IMPRESSION: No acute cardiopulmonary abnormality. Electronically Signed   By: Yetta Glassman M.D.   On: 12/29/2021 14:48    Procedures Procedures   Medications Ordered in ED Medications  hydrALAZINE (APRESOLINE) injection 10 mg (has no administration in time range)  alum & mag hydroxide-simeth (MAALOX/MYLANTA) 200-200-20 MG/5ML suspension 30 mL (30 mLs Oral Given 12/29/21 1801)    ED Course/ Medical Decision Making/ A&P Clinical Course as of 12/29/21  9470  Wed Dec 29, 2021  1445 EKG 12-Lead [MC]    Clinical Course User Index [MC] Carp, Hosie Spangle, Student-PA                           Medical Decision Making Risk OTC drugs. Prescription drug management. Decision regarding hospitalization.   51 year old female presents the emergency room for evaluation of chest pain.  Differential diagnosis includes but is not limited to ACS, arrhythmia, pneumonia, pneumothorax, PE, dissection, aneurysm, pancreatitis, hypertensive urgency.  Vital signs show elevated blood pressure in the 180s over 90s.  Afebrile, borderline bradycardia, satting well on room air without increased work of breathing.  Physical exam is overall unremarkable other than some epigastric tenderness palpation.  Abdomen is soft with normal active bowel sounds.  Heart is regular rate rhythm.  Patient's well-appearing not diaphoretic.  Lungs are clear.  Chest pain work-up was placed in triage.  I independently reviewed and interpreted the patient's labs.  BMP shows slightly elevated glucose at 116 although not fasting.  Patient's creatinine of 1.23 which appears to be around patient's baseline.  No other electrolyte abnormality.  BNP normal.  Lipase normal.  CBC without  leukocytosis or anemia.  Troponin initial at 11 with repeat of 12.  EKG was evaluated and interpreted by my attending. Sinus rhythm Borderline prolonged PR interval Left atrial enlargement Nonspecific IVCD with LAD LVH with secondary repolarization abnormality Anterior Q waves, possibly due to LVH.  CXR shows cardiac contours are upper limits of normal in size, unchanged when compared to prior. Mild left basilar atelectasis, lungs otherwise clear. The visualized skeletal structures are unremarkable.  On prior chart review, patient was admitted on 11-19-2021 for hypertensive urgency as well as chest pain.  Discharge summary, patient had similar presentation to today with chest pain, epigastric pain as well as elevated blood pressure.  Echo was done during this admission and shows an EF of less than 20%.  I spoke with the on-call cardiologist, Dr. Corie Chiquito who reviewed the patient's chart. He reports if the second troponin is ok, he doesn't see any need for cardiology involvement.   My attending reviewed the chart and reports that the patient should be admitted for this repeat chest pain, hypertension, and low EF.   Discussed this with Dr. Roel Cluck who will admit the patient.   I discussed this case with my attending physician who cosigned this note including patient's presenting symptoms, physical exam, and planned diagnostics and interventions. Attending physician stated agreement with plan or made changes to plan which were implemented.   Final Clinical Impression(s) / ED Diagnoses Final diagnoses:  Hypertensive urgency  Chest pain, unspecified type    Rx / DC Orders ED Discharge Orders     None         Sherrell Puller, [place] 12/30/21 0025

## 2021-12-30 DIAGNOSIS — I160 Hypertensive urgency: Secondary | ICD-10-CM | POA: Diagnosis not present

## 2021-12-30 DIAGNOSIS — I5022 Chronic systolic (congestive) heart failure: Secondary | ICD-10-CM | POA: Diagnosis not present

## 2021-12-30 DIAGNOSIS — E785 Hyperlipidemia, unspecified: Secondary | ICD-10-CM | POA: Diagnosis present

## 2021-12-30 DIAGNOSIS — R079 Chest pain, unspecified: Secondary | ICD-10-CM | POA: Diagnosis not present

## 2021-12-30 LAB — COMPREHENSIVE METABOLIC PANEL
ALT: 10 U/L (ref 0–44)
AST: 16 U/L (ref 15–41)
Albumin: 3.5 g/dL (ref 3.5–5.0)
Alkaline Phosphatase: 59 U/L (ref 38–126)
Anion gap: 8 (ref 5–15)
BUN: 18 mg/dL (ref 6–20)
CO2: 25 mmol/L (ref 22–32)
Calcium: 9.2 mg/dL (ref 8.9–10.3)
Chloride: 104 mmol/L (ref 98–111)
Creatinine, Ser: 0.92 mg/dL (ref 0.44–1.00)
GFR, Estimated: >60 mL/min (ref >60)
Glucose, Bld: 88 mg/dL (ref 70–99)
Potassium: 3.5 mmol/L (ref 3.5–5.1)
Sodium: 137 mmol/L (ref 135–145)
Total Bilirubin: 0.9 mg/dL (ref 0.3–1.2)
Total Protein: 7.2 g/dL (ref 6.5–8.1)

## 2021-12-30 LAB — HEPATIC FUNCTION PANEL
ALT: 8 U/L (ref 0–44)
AST: 18 U/L (ref 15–41)
Albumin: 3.6 g/dL (ref 3.5–5.0)
Alkaline Phosphatase: 62 U/L (ref 38–126)
Bilirubin, Direct: 0.2 mg/dL (ref 0.0–0.2)
Indirect Bilirubin: 0.5 mg/dL (ref 0.3–0.9)
Total Bilirubin: 0.7 mg/dL (ref 0.3–1.2)
Total Protein: 6.8 g/dL (ref 6.5–8.1)

## 2021-12-30 LAB — CBC
HCT: 40.3 % (ref 36.0–46.0)
Hemoglobin: 13.2 g/dL (ref 12.0–15.0)
MCH: 27 pg (ref 26.0–34.0)
MCHC: 32.8 g/dL (ref 30.0–36.0)
MCV: 82.6 fL (ref 80.0–100.0)
Platelets: 226 10*3/uL (ref 150–400)
RBC: 4.88 MIL/uL (ref 3.87–5.11)
RDW: 14.8 % (ref 11.5–15.5)
WBC: 6.8 10*3/uL (ref 4.0–10.5)
nRBC: 0 % (ref 0.0–0.2)

## 2021-12-30 LAB — URINALYSIS, COMPLETE (UACMP) WITH MICROSCOPIC
Bacteria, UA: NONE SEEN
Bilirubin Urine: NEGATIVE
Glucose, UA: >=500 mg/dL — AB
Hgb urine dipstick: NEGATIVE
Ketones, ur: NEGATIVE mg/dL
Leukocytes,Ua: NEGATIVE
Nitrite: NEGATIVE
Protein, ur: NEGATIVE mg/dL
Specific Gravity, Urine: 1.013 (ref 1.005–1.030)
pH: 6 (ref 5.0–8.0)

## 2021-12-30 LAB — RAPID URINE DRUG SCREEN, HOSP PERFORMED
Amphetamines: NOT DETECTED
Barbiturates: NOT DETECTED
Benzodiazepines: NOT DETECTED
Cocaine: NOT DETECTED
Opiates: NOT DETECTED
Tetrahydrocannabinol: POSITIVE — AB

## 2021-12-30 LAB — PHOSPHORUS: Phosphorus: 3.2 mg/dL (ref 2.5–4.6)

## 2021-12-30 LAB — D-DIMER, QUANTITATIVE: D-Dimer, Quant: 0.4 ug/mL-FEU (ref 0.00–0.50)

## 2021-12-30 LAB — ETHANOL: Alcohol, Ethyl (B): <10 mg/dL

## 2021-12-30 LAB — MAGNESIUM: Magnesium: 2.3 mg/dL (ref 1.7–2.4)

## 2021-12-30 LAB — TSH: TSH: 0.696 u[IU]/mL (ref 0.350–4.500)

## 2021-12-30 MED ORDER — SODIUM CHLORIDE 0.9% FLUSH: 3.0000 mL | INTRAVENOUS | Status: DC | PRN

## 2021-12-30 MED ORDER — HYDRALAZINE HCL 50 MG PO TABS
100.0000 mg | ORAL_TABLET | Freq: Three times a day (TID) | ORAL | Status: DC
  Administered 2021-12-30 – 2021-12-31 (×4): 100 mg via ORAL
  Filled 2021-12-30 (×4): qty 2

## 2021-12-30 MED ORDER — ATORVASTATIN CALCIUM 10 MG PO TABS
20.0000 mg | ORAL_TABLET | Freq: Every day | ORAL | Status: DC
  Administered 2021-12-30 – 2021-12-31 (×2): 20 mg via ORAL
  Filled 2021-12-30 (×2): qty 2

## 2021-12-30 MED ORDER — AMLODIPINE BESYLATE 5 MG PO TABS
5.0000 mg | ORAL_TABLET | Freq: Every day | ORAL | Status: DC
  Administered 2021-12-30 – 2021-12-31 (×2): 5 mg via ORAL
  Filled 2021-12-30 (×2): qty 1

## 2021-12-30 MED ORDER — SODIUM CHLORIDE 0.9 % IV SOLN: 250.0000 mL | INTRAVENOUS | Status: DC | PRN

## 2021-12-30 MED ORDER — ISOSORBIDE MONONITRATE ER 60 MG PO TB24
60.0000 mg | ORAL_TABLET | Freq: Every day | ORAL | Status: DC
  Administered 2021-12-30 – 2021-12-31 (×2): 60 mg via ORAL
  Filled 2021-12-30 (×2): qty 1

## 2021-12-30 MED ORDER — ACETAMINOPHEN 650 MG RE SUPP: 650.0000 mg | Freq: Four times a day (QID) | RECTAL | Status: DC | PRN

## 2021-12-30 MED ORDER — SODIUM CHLORIDE 0.9% FLUSH
3.0000 mL | Freq: Two times a day (BID) | INTRAVENOUS | Status: DC
  Administered 2021-12-30 – 2021-12-31 (×3): 3 mL via INTRAVENOUS

## 2021-12-30 MED ORDER — SPIRONOLACTONE 25 MG PO TABS
25.0000 mg | ORAL_TABLET | Freq: Every day | ORAL | Status: DC
  Administered 2021-12-30 – 2021-12-31 (×2): 25 mg via ORAL
  Filled 2021-12-30 (×2): qty 1

## 2021-12-30 MED ORDER — PANTOPRAZOLE SODIUM 40 MG PO TBEC
40.0000 mg | DELAYED_RELEASE_TABLET | Freq: Every day | ORAL | Status: DC
  Administered 2021-12-30 – 2021-12-31 (×2): 40 mg via ORAL
  Filled 2021-12-30 (×2): qty 1

## 2021-12-30 MED ORDER — FENTANYL CITRATE PF 50 MCG/ML IJ SOSY: 12.5000 ug | PREFILLED_SYRINGE | INTRAMUSCULAR | Status: DC | PRN

## 2021-12-30 MED ORDER — FUROSEMIDE 20 MG PO TABS
20.0000 mg | ORAL_TABLET | Freq: Every day | ORAL | Status: DC
  Administered 2021-12-30 – 2021-12-31 (×2): 20 mg via ORAL
  Filled 2021-12-30 (×2): qty 1

## 2021-12-30 MED ORDER — HYDROCODONE-ACETAMINOPHEN 5-325 MG PO TABS: 1.0000 | ORAL_TABLET | ORAL | Status: DC | PRN

## 2021-12-30 MED ORDER — ENOXAPARIN SODIUM 30 MG/0.3ML IJ SOSY
30.0000 mg | PREFILLED_SYRINGE | INTRAMUSCULAR | Status: DC
  Administered 2021-12-30 – 2021-12-31 (×2): 30 mg via SUBCUTANEOUS
  Filled 2021-12-30 (×2): qty 0.3

## 2021-12-30 MED ORDER — HYDRALAZINE HCL 20 MG/ML IJ SOLN
10.0000 mg | Freq: Three times a day (TID) | INTRAMUSCULAR | Status: DC | PRN
  Administered 2021-12-31: 10 mg via INTRAVENOUS
  Filled 2021-12-30 (×2): qty 1

## 2021-12-30 MED ORDER — DAPAGLIFLOZIN PROPANEDIOL 10 MG PO TABS
10.0000 mg | ORAL_TABLET | Freq: Every day | ORAL | Status: DC
  Administered 2021-12-31: 10 mg via ORAL
  Filled 2021-12-30: qty 1

## 2021-12-30 MED ORDER — CARVEDILOL 12.5 MG PO TABS
12.5000 mg | ORAL_TABLET | Freq: Two times a day (BID) | ORAL | Status: DC
  Administered 2021-12-30 – 2021-12-31 (×3): 12.5 mg via ORAL
  Filled 2021-12-30 (×3): qty 1

## 2021-12-30 MED ORDER — ASPIRIN 81 MG PO TBEC
81.0000 mg | DELAYED_RELEASE_TABLET | Freq: Every day | ORAL | Status: DC
  Administered 2021-12-30 – 2021-12-31 (×2): 81 mg via ORAL
  Filled 2021-12-30 (×2): qty 1

## 2021-12-30 MED ORDER — ACETAMINOPHEN 325 MG PO TABS
650.0000 mg | ORAL_TABLET | Freq: Four times a day (QID) | ORAL | Status: DC | PRN
  Administered 2021-12-30: 650 mg via ORAL
  Filled 2021-12-30: qty 2

## 2021-12-30 NOTE — Hospital Course (Addendum)
51 year old woman PMH including CHF, uncontrolled hypertension presented with hypertensive urgency and chest pain.  Admitted for same.

## 2021-12-30 NOTE — TOC Progression Note (Signed)
Transition of Care [facility]) - Progression Note    Patient Details  Name: Emily Key MRN: [id number] Date of Birth: 1971-05-14  Transition of Care [facility] CM/SW Contact  Zenon Mayo, RN Phone Number: 12/30/2021, 2:27 PM  Clinical Narrative:    from home with sister, chest pain, indep, bp in 200's, gets hydralazine.  TOC will continue to follow for dc needs.         Expected Discharge Plan and Services                                                 Social Determinants of Health (SDOH) Interventions    Readmission Risk Interventions     No data to display

## 2021-12-30 NOTE — Progress Notes (Addendum)
Pateint had a 13 beat run of VT , on assessment patent denies and chest pain or pressure. Vs stable.

## 2021-12-30 NOTE — ED Notes (Signed)
ED TO INPATIENT HANDOFF REPORT  ED Nurse Name and Phone #: [phone]  S Name/Age/Gender Emily Key 51 y.o. female Room/Bed: 033C/033C  Code Status   Code Status: Full Code  Home/SNF/Other Home Patient oriented to: self, place, time, and situation Is this baseline? Yes   Triage Complete: Triage complete  Chief Complaint Hypertensive urgency [I16.0]  Triage Note Pt BIB [facility] for sudden onset L sided CP that radiates to L shoulder pain. Pt diaphoretic with pain. Given 324 asa and 4 SL NTG. Pain from 8/10 to 3/10. Pt endorses similar sx yesterday that went away without intervention. Hx CHF and cardiac cath, no hx MI. 157/82, HR 67, 99%   Allergies No Known Allergies  Level of Care/Admitting Diagnosis ED Disposition     ED Disposition  Admit   Condition  --   Comment  Hospital Area: [facility] [100100]  Level of Care: Progressive [102]  Admit to Progressive based on following criteria: CARDIOVASCULAR & THORACIC of moderate stability with acute coronary syndrome symptoms/low risk myocardial infarction/hypertensive urgency/arrhythmias/heart failure potentially compromising stability and stable post cardiovascular intervention patients.  May place patient in observation at [facility] or [facility] if equivalent level of care is available:: No  Covid Evaluation: Asymptomatic - no recent exposure (last 10 days) testing not required  Diagnosis: Hypertensive urgency [161096]  Admitting Physician: [place], [place]  Attending Physician: Toy Baker [[id number]]          B Medical/Surgery History Past Medical History:  Diagnosis Date   Cerumen impaction 01/2019   CHF (congestive heart failure) ([place])    after last pregnancy   Daily headache    Heart murmur    "born w/one":   History of hiatal hernia    Hyperlipidemia 01/2019   Hypertension    Hypertensive emergency 01/02/2019   Observed seizure-like activity ([place]) 01/02/2019   Pneumonia  11/2014   Seasonal allergies 01/2019   Vitamin D deficiency 01/2019   Past Surgical History:  Procedure Laterality Date   CARDIAC CATHETERIZATION N/A 02/13/2015   Procedure: Left Heart Cath and Coronary Angiography;  Surgeon: Troy Sine, MD;  Location: [facility];  Service: Cardiovascular;  Laterality: N/A;   PLEURAL EFFUSION DRAINAGE Left 11/14/2014   Procedure: DRAINAGE OF LEFT PLEURAL EFFUSION;  Surgeon: Melrose Nakayama, MD;  Location: [facility];  Service: Thoracic;  Laterality: Left;   RIGHT/LEFT HEART CATH AND CORONARY ANGIOGRAPHY N/A 12/24/2020   Procedure: RIGHT/LEFT HEART CATH AND CORONARY ANGIOGRAPHY;  Surgeon: Jolaine Artist, MD;  Location: [facility];  Service: Cardiovascular;  Laterality: N/A;   TONSILLECTOMY AND ADENOIDECTOMY  ~ [place] (VATS)/DECORTICATION Left 11/14/2014   Procedure: LEFT VIDEO ASSISTED THORACOSCOPY (VATS)/DECORTICATION;  Surgeon: Melrose Nakayama, MD;  Location: [facility];  Service: Thoracic;  Laterality: Left;     A IV Location/Drains/Wounds Patient Lines/Drains/Airways Status     Active Line/Drains/Airways     Name Placement date Placement time Site Days   Peripheral IV 12/29/21 18 G Right Antecubital 12/29/21  0000  Antecubital  1            Intake/Output Last 24 hours No intake or output data in the 24 hours ending 12/30/21 0735  Labs/Imaging Results for orders placed or performed during the hospital encounter of 12/29/21 (from the past 48 hour(s))  Basic metabolic panel     Status: Abnormal   Collection Time: 12/29/21  2:38 PM  Result  Value Ref Range   Sodium 135 135 - 145 mmol/L   Potassium 3.8 3.5 - 5.1 mmol/L   Chloride 104 98 - 111 mmol/L   CO2 23 22 - 32 mmol/L   Glucose, Bld 116 (H) 70 - 99 mg/dL    Comment: Glucose reference range applies only to samples taken after fasting for at least 8 hours.   BUN 20 6 - 20 mg/dL   Creatinine, Ser 1.23 (H) 0.44 - 1.00  mg/dL   Calcium 8.9 8.9 - 10.3 mg/dL   GFR, Estimated 53 (L) >60 mL/min    Comment: (NOTE) Calculated using the CKD-EPI Creatinine Equation (2021)    Anion gap 8 5 - 15    Comment: Performed at [facility] [address] [place] [address]  CBC     Status: None   Collection Time: 12/29/21  2:38 PM  Result Value Ref Range   WBC 6.8 4.0 - 10.5 K/uL   RBC 4.80 3.87 - 5.11 MIL/uL   Hemoglobin 12.8 12.0 - 15.0 g/dL   HCT 39.8 36.0 - 46.0 %   MCV 82.9 80.0 - 100.0 fL   MCH 26.7 26.0 - 34.0 pg   MCHC 32.2 30.0 - 36.0 g/dL   RDW 14.8 11.5 - 15.5 %   Platelets 226 150 - 400 K/uL   nRBC 0.0 0.0 - 0.2 %    Comment: Performed at [facility], [place] [address] [place], [place] [address]  Troponin I (High Sensitivity)     Status: None   Collection Time: 12/29/21  2:38 PM  Result Value Ref Range   Troponin I (High Sensitivity) 11 <18 ng/L    Comment: (NOTE) Elevated high sensitivity troponin I (hsTnI) values and significant  changes across serial measurements may suggest ACS but many other  chronic and acute conditions are known to elevate hsTnI results.  Refer to the "Links" section for chest pain algorithms and additional  guidance. Performed at [facility], [place] [address] [place], [place] [address]   Brain natriuretic peptide     Status: None   Collection Time: 12/29/21  2:38 PM  Result Value Ref Range   B Natriuretic Peptide 58.9 0.0 - 100.0 pg/mL    Comment: Performed at [facility] [address], [place], [place] [address]  Lipase, blood     Status: None   Collection Time: 12/29/21  3:30 PM  Result Value Ref Range   Lipase 24 11 - 51 U/L    Comment: Performed at [facility] [address], [place], [place] [address]  Troponin I (High Sensitivity)     Status: None   Collection Time: 12/29/21  5:07 PM  Result Value Ref Range   Troponin I (High Sensitivity) 12 <18 ng/L    Comment: (NOTE) Elevated high sensitivity troponin I (hsTnI)  values and significant  changes across serial measurements may suggest ACS but many other  chronic and acute conditions are known to elevate hsTnI results.  Refer to the "Links" section for chest pain algorithms and additional  guidance. Performed at [facility], [place] [address], [place], [place] [address]   Hepatic function panel     Status: None   Collection Time: 12/29/21 11:23 PM  Result Value Ref Range   Total Protein 6.8 6.5 - 8.1 g/dL   Albumin 3.6 3.5 - 5.0 g/dL   AST 18 15 - 41 U/L   ALT 8 0 -  44 U/L   Alkaline Phosphatase 62 38 - 126 U/L   Total Bilirubin 0.7 0.3 - 1.2 mg/dL   Bilirubin, Direct 0.2 0.0 - 0.2 mg/dL   Indirect Bilirubin 0.5 0.3 - 0.9 mg/dL    Comment: Performed at [facility] [address], [place], [place] [address]  Magnesium     Status: None   Collection Time: 12/29/21 11:23 PM  Result Value Ref Range   Magnesium 2.3 1.7 - 2.4 mg/dL    Comment: Performed at [facility], [place] [address] [place], [place] [address]  Phosphorus     Status: None   Collection Time: 12/29/21 11:23 PM  Result Value Ref Range   Phosphorus 3.2 2.5 - 4.6 mg/dL    Comment: Performed at [facility], [place] [address], [place], [place] [address]  TSH     Status: None   Collection Time: 12/29/21 11:23 PM  Result Value Ref Range   TSH 0.696 0.350 - 4.500 uIU/mL    Comment: Performed by a 3rd Generation assay with a functional sensitivity of <=0.01 uIU/mL. Performed at [facility], [place] [address], [place], [place] [address]   Ethanol     Status: None   Collection Time: 12/29/21 11:23 PM  Result Value Ref Range   Alcohol, Ethyl (B) <10 <10 mg/dL    Comment: (NOTE) Lowest detectable limit for serum alcohol is 10 mg/dL.  For medical purposes only. Performed at [facility], [place] [address], [place], [place] [address]   D-dimer, quantitative     Status: None   Collection Time: 12/30/21  4:00 AM  Result Value Ref Range   D-Dimer, Quant  0.40 0.00 - 0.50 ug/mL-FEU    Comment: (NOTE) At the manufacturer cut-off value of 0.5 g/mL FEU, this assay has a negative predictive value of 95-100%.This assay is intended for use in conjunction with a clinical pretest probability (PTP) assessment model to exclude pulmonary embolism (PE) and deep venous thrombosis (DVT) in outpatients suspected of PE or DVT. Results should be correlated with clinical presentation. Performed at [facility], [place] [address] [place], [place] [address]   Comprehensive metabolic panel     Status: None   Collection Time: 12/30/21  4:00 AM  Result Value Ref Range   Sodium 137 135 - 145 mmol/L   Potassium 3.5 3.5 - 5.1 mmol/L   Chloride 104 98 - 111 mmol/L   CO2 25 22 - 32 mmol/L   Glucose, Bld 88 70 - 99 mg/dL    Comment: Glucose reference range applies only to samples taken after fasting for at least 8 hours.   BUN 18 6 - 20 mg/dL   Creatinine, Ser 0.92 0.44 - 1.00 mg/dL   Calcium 9.2 8.9 - 10.3 mg/dL   Total Protein 7.2 6.5 - 8.1 g/dL   Albumin 3.5 3.5 - 5.0 g/dL   AST 16 15 - 41 U/L   ALT 10 0 - 44 U/L   Alkaline Phosphatase 59 38 - 126 U/L   Total Bilirubin 0.9 0.3 - 1.2 mg/dL   GFR, Estimated >60 >60 mL/min    Comment: (NOTE) Calculated using the CKD-EPI Creatinine Equation (2021)    Anion gap 8 5 - 15    Comment: Performed at [facility] [address] [place],  [address]  CBC     Status: None   Collection Time: 12/30/21  4:00 AM  Result Value Ref Range  WBC 6.8 4.0 - 10.5 K/uL   RBC 4.88 3.87 - 5.11 MIL/uL   Hemoglobin 13.2 12.0 - 15.0 g/dL   HCT 40.3 36.0 - 46.0 %   MCV 82.6 80.0 - 100.0 fL   MCH 27.0 26.0 - 34.0 pg   MCHC 32.8 30.0 - 36.0 g/dL   RDW 14.8 11.5 - 15.5 %   Platelets 226 150 - 400 K/uL   nRBC 0.0 0.0 - 0.2 %    Comment: Performed at [facility], [place] [address] [place], [place] [address]   DG Chest Port 1 View  Result Date: 12/29/2021 CLINICAL DATA:  Chest pain EXAM: PORTABLE CHEST 1  VIEW COMPARISON:  Chest x-ray dated Nov 19, 2021 FINDINGS: Cardiac contours are upper limits of normal in size, unchanged when compared to prior. Mild left basilar atelectasis, lungs otherwise clear. The visualized skeletal structures are unremarkable. IMPRESSION: No acute cardiopulmonary abnormality. Electronically Signed   By: Yetta Glassman M.D.   On: 12/29/2021 14:48    Pending Labs Unresulted Labs (From admission, onward)     Start     Ordered   12/30/21 0237  HIV Antibody (routine testing w rflx)  (HIV Antibody (Routine testing w reflex) panel)  Once,   R        12/30/21 0237   12/29/21 2225  Urinalysis, Complete w Microscopic  Once,   R       Question:  Release to patient  Answer:  Immediate   12/29/21 2224   12/29/21 2225  Rapid urine drug screen (hospital performed)  ONCE - STAT,   STAT        12/29/21 2224            Vitals/Pain Today's Vitals   12/30/21 0700 12/30/21 0715 12/30/21 0731 12/30/21 0731  BP: (!) 208/98     Pulse: (!) 58 61 65   Resp: '16 17 18   '$ Temp:      TempSrc:      SpO2: 99% 100% 100%   PainSc:    0-No pain    Isolation Precautions No active isolations  Medications Medications  atorvastatin (LIPITOR) tablet 20 mg (has no administration in time range)  carvedilol (COREG) tablet 12.5 mg (has no administration in time range)  furosemide (LASIX) tablet 20 mg (has no administration in time range)  hydrALAZINE (APRESOLINE) tablet 100 mg (has no administration in time range)  isosorbide mononitrate (IMDUR) 24 hr tablet 60 mg (has no administration in time range)  acetaminophen (TYLENOL) tablet 650 mg (has no administration in time range)    Or  acetaminophen (TYLENOL) suppository 650 mg (has no administration in time range)  HYDROcodone-acetaminophen (NORCO/VICODIN) 5-325 MG per tablet 1-2 tablet (has no administration in time range)  enoxaparin (LOVENOX) injection 30 mg (has no administration in time range)  sodium chloride flush (NS) 0.9 %  injection 3 mL (0 mLs Intravenous Hold 12/30/21 0520)  sodium chloride flush (NS) 0.9 % injection 3 mL (has no administration in time range)  0.9 %  sodium chloride infusion (has no administration in time range)  fentaNYL (SUBLIMAZE) injection 12.5-50 mcg (has no administration in time range)  aspirin EC tablet 81 mg (has no administration in time range)  pantoprazole (PROTONIX) EC tablet 40 mg (has no administration in time range)  alum & mag hydroxide-simeth (MAALOX/MYLANTA) 200-200-20 MG/5ML suspension 30 mL (30 mLs Oral Given 12/29/21 1801)  hydrALAZINE (APRESOLINE) injection 10 mg (10 mg Intravenous Given 12/29/21 2003)    Mobility walks  Low fall risk   Focused Assessments     R Recommendations: See Admitting Provider Note  Report given to:   Additional Notes:

## 2021-12-30 NOTE — Progress Notes (Addendum)
  Progress Note   Patient: Emily Key JKK:[id number] DOB: 1971-04-10 DOA: 12/29/2021     0 DOS: the patient was seen and examined on 12/30/2021   Brief hospital course: 51 year old woman PMH including CHF, uncontrolled hypertension presented with hypertensive urgency and chest pain.  Admitted for same.  Assessment and Plan: Hypertensive urgency with chest pain --Chest pain resolved.  No signs of new end-organ damage. Troponins negative.  EKG nonacute. --Hypertension poorly controlled, recently saw pharmacist and hydralazine was increased.  Patient reports compliance with medications. --Continue carvedilol, hydralazine, Lasix, Imdur; added amlodipine and added spironolactone --continue to follow  Atypical chest pain --ACS ruled out.  Secondary to hypertension.  Chronic systolic CHF --Appears euvolemic --continue carvedilol, furosemide, Farxiga, Imdur; unclear why not on ACE-I/ARB or Entresto  CKD considered but does not meet criteria     Subjective:  Feels ok Reports compliance with medications BP runs high at home in 180s Breathing ok Overall somewhat better  Physical Exam: Vitals:   12/30/21 0912 12/30/21 1107 12/30/21 1324 12/30/21 1811  BP: (!) 212/123 (!) 197/88 (!) 186/94 (!) 191/107  Pulse:    70  Resp: 18   18  Temp: 97.7 F (36.5 C)   97.9 F (36.6 C)  TempSrc: Oral   Oral  SpO2: 98%   96%  Weight: 79.5 kg     Height: '5\' 7"'$  (1.702 m)      Physical Exam Vitals reviewed.  Constitutional:      General: She is not in acute distress.    Appearance: She is not ill-appearing or toxic-appearing.  Cardiovascular:     Rate and Rhythm: Normal rate and regular rhythm.     Heart sounds: No murmur heard. Pulmonary:     Effort: Pulmonary effort is normal. No respiratory distress.     Breath sounds: No wheezing, rhonchi or rales.  Musculoskeletal:     Right lower leg: No edema.     Left lower leg: No edema.  Neurological:     Mental Status: She is alert.   Psychiatric:        Mood and Affect: Mood normal.     Data Reviewed:  CMP unremarkable BNP and troponins negative CBC WNL Ddimer WNL TSH WNL UDS positive for [facility]  Family Communication: none  Disposition: Status is: Observation   Planned Discharge Destination: Home    Time spent: 35 minutes  Author: Murray Hodgkins, MD 12/30/2021 6:50 PM  For on call review [email or web address].

## 2021-12-30 NOTE — Assessment & Plan Note (Addendum)
Continue Lipitor 20 mg a day

## 2021-12-30 NOTE — Assessment & Plan Note (Signed)
-  chronic avoid nephrotoxic medications such as NSAIDs, Vanco Zosyn combo,  avoid hypotension, continue to follow renal function  

## 2021-12-31 ENCOUNTER — Other Ambulatory Visit: Payer: Self-pay

## 2021-12-31 ENCOUNTER — Telehealth (HOSPITAL_COMMUNITY): Payer: Self-pay | Admitting: Licensed Clinical Social Worker

## 2021-12-31 DIAGNOSIS — I5022 Chronic systolic (congestive) heart failure: Secondary | ICD-10-CM | POA: Diagnosis not present

## 2021-12-31 DIAGNOSIS — R079 Chest pain, unspecified: Secondary | ICD-10-CM | POA: Diagnosis not present

## 2021-12-31 DIAGNOSIS — I160 Hypertensive urgency: Secondary | ICD-10-CM | POA: Diagnosis not present

## 2021-12-31 LAB — BASIC METABOLIC PANEL
Anion gap: 11 (ref 5–15)
BUN: 18 mg/dL (ref 6–20)
CO2: 22 mmol/L (ref 22–32)
Calcium: 9.4 mg/dL (ref 8.9–10.3)
Chloride: 102 mmol/L (ref 98–111)
Creatinine, Ser: 0.94 mg/dL (ref 0.44–1.00)
GFR, Estimated: >60 mL/min (ref >60)
Glucose, Bld: 113 mg/dL — ABNORMAL HIGH (ref 70–99)
Potassium: 3.8 mmol/L (ref 3.5–5.1)
Sodium: 135 mmol/L (ref 135–145)

## 2021-12-31 LAB — HIV ANTIBODY (ROUTINE TESTING W REFLEX): HIV Screen 4th Generation wRfx: NONREACTIVE

## 2021-12-31 LAB — MAGNESIUM: Magnesium: 2.3 mg/dL (ref 1.7–2.4)

## 2021-12-31 MED ORDER — CLONIDINE HCL 0.2 MG PO TABS
0.2000 mg | ORAL_TABLET | ORAL | Status: DC | PRN
  Administered 2021-12-31: 0.2 mg via ORAL
  Filled 2021-12-31: qty 1

## 2021-12-31 MED ORDER — AMLODIPINE BESYLATE 5 MG PO TABS
5.0000 mg | ORAL_TABLET | Freq: Every day | ORAL | 2 refills | Status: DC
  Filled 2021-12-31: qty 30, 30d supply, fill #0
  Filled 2022-02-11 – 2022-06-22 (×2): qty 30, 30d supply, fill #1

## 2021-12-31 MED ORDER — POTASSIUM CHLORIDE CRYS ER 20 MEQ PO TBCR: 40.0000 meq | EXTENDED_RELEASE_TABLET | Freq: Once | ORAL | Status: DC

## 2021-12-31 MED ORDER — ENOXAPARIN SODIUM 40 MG/0.4ML IJ SOSY: 40.0000 mg | PREFILLED_SYRINGE | INTRAMUSCULAR | Status: DC

## 2022-01-06 ENCOUNTER — Ambulatory Visit: Payer: 59 | Admitting: Nurse Practitioner

## 2022-01-12 ENCOUNTER — Telehealth (HOSPITAL_COMMUNITY): Payer: Self-pay

## 2022-01-12 NOTE — Telephone Encounter (Signed)
 Called to confirm/remind patient of their appointment at the [facility] on 01/13/22.   Patient reminded to bring all medications and/or complete list.  Confirmed patient has transportation. Gave directions, instructed to utilize  parking.  Confirmed appointment prior to ending call.

## 2022-01-13 ENCOUNTER — Encounter (HOSPITAL_COMMUNITY): Payer: 59

## 2022-01-14 ENCOUNTER — Other Ambulatory Visit (HOSPITAL_COMMUNITY): Payer: Self-pay | Admitting: Emergency Medicine

## 2022-01-14 NOTE — Progress Notes (Signed)
Paramedicine Encounter    Patient ID: Emily Key, female    DOB: Jul 10, 1971, 51 y.o.   MRN: [id number]   BP 110/70 (BP Location: Left Arm, Patient Position: Sitting, Cuff Size: Normal)   Pulse 80   Resp 16   SpO2 97%  Weight yesterday-not taken Last visit weight-179lb.   ATF Ms. Switala A&O x 4, skin warm and dry with good color  Pt. Has no complaints of chest pain or SOB.  Lung sounds clear an equal bilat.  No edema or JVD noted.  She was 100% compliant with her medications.  Med box reconciled.  No other needs or complaints at this time.  Home visit complete.  Patient Care Team: Vevelyn Francois, NP as PCP - General (Adult Health Nurse Practitioner) Haroldine Laws Shaune Pascal, MD as PCP - Advanced Heart Failure (Cardiology) Jolaine Artist, MD as PCP - Cardiology (Cardiology)  Patient Active Problem List   Diagnosis Date Noted   Hyperlipidemia 12/30/2021   AKI (acute kidney injury) [facility])    Abdominal pain, epigastric    Chronic systolic CHF (congestive heart failure) ([place]) 11/19/2021   Cardiomyopathy ([place]) 12/22/2020   Vitamin D deficiency 02/22/2019   Bilateral impacted cerumen 01/21/2019   Seasonal allergies 01/21/2019   Hypertensive emergency 01/02/2019   Marijuana abuse 01/02/2019   Tobacco dependence 01/02/2019   Hyperglycemia 01/02/2019   Acute on chronic systolic CHF (congestive heart failure) ([place]) 01/02/2019   Seizure ([place]) 01/02/2019   Adnexal mass 02/18/2015   Elevated troponin 02/14/2015   Empyema lung (HCC)    H/O non-ST elevation myocardial infarction (NSTEMI)    Substance abuse ([place])    Accelerated hypertension 01/30/2015   Chronic kidney disease 11/28/2014   Hypertension 11/26/2014   Chest pain    Hypertensive urgency 11/11/2014    Current Outpatient Medications:    acetaminophen (TYLENOL) 500 MG tablet, Take 1,000 mg by mouth every 6 (six) hours as needed for moderate pain or headache., Disp: , Rfl:    amLODipine (NORVASC) 5 MG tablet, Take 1 tablet  (5 mg total) by mouth daily., Disp: 30 tablet, Rfl: 2   atorvastatin (LIPITOR) 20 MG tablet, Take 1 tablet (20 mg total) by mouth daily., Disp: 90 tablet, Rfl: 3   carvedilol (COREG) 12.5 MG tablet, Take 1 tablet (12.5 mg total) by mouth 2 (two) times daily with a meal., Disp: 60 tablet, Rfl: 1   dapagliflozin propanediol (FARXIGA) 10 MG TABS tablet, Take 1 tablet (10 mg total) by mouth daily., Disp: 90 tablet, Rfl: 3   furosemide (LASIX) 20 MG tablet, Take 1 tablet (20 mg total) by mouth daily., Disp: 90 tablet, Rfl: 3   isosorbide mononitrate (IMDUR) 30 MG 24 hr tablet, Take 2 tablets (60 mg total) by mouth daily., Disp: 180 tablet, Rfl: 3   citalopram (CELEXA) 10 MG tablet, Take 1 tablet (10 mg total) by mouth daily. (Patient not taking: Reported on 12/29/2021), Disp: 30 tablet, Rfl: 0   hydrALAZINE (APRESOLINE) 100 MG tablet, Take 1 tablet (100 mg total) by mouth 3 (three) times daily., Disp: 270 tablet, Rfl: 3 No Known Allergies    Social History   Socioeconomic History   Marital status: Divorced    Spouse name: Not on file   Number of children: Not on file   Years of education: Not on file   Highest education level: Not on file  Occupational History   Not on file  Tobacco Use   Smoking status: Former    Packs/day: 0.33  Years: 10.00    Total pack years: 3.30    Types: Cigarettes    Quit date: 11/11/2014    Years since quitting: 7.1   Smokeless tobacco: Never  Substance and Sexual Activity   Alcohol use: Yes    Alcohol/week: 0.0 standard drinks of alcohol    Comment: 8/1/20176 "a couple beers a couple times/month"   Drug use: No   Sexual activity: Yes  Other Topics Concern   Not on file  Social History Narrative   Not on file   Social Determinants of Health   Financial Resource Strain: High Risk (12/23/2020)   Overall Financial Resource Strain (CARDIA)    Difficulty of Paying Living Expenses: Hard  Food Insecurity: No Food Insecurity (12/08/2021)   Hunger Vital Sign     Worried About Running Out of Food in the Last Year: Never true    Ran Out of Food in the Last Year: Never true  Transportation Needs: Unmet Transportation Needs (12/08/2021)   PRAPARE - Hydrologist (Medical): Yes    Lack of Transportation (Non-Medical): Yes  Physical Activity: Not on file  Stress: Not on file  Social Connections: Not on file  Intimate Partner Violence: Not on file    Physical Exam      Future Appointments  Date Time Provider [place]  03/10/2022  1:00 PM [facility] ECHO OP 1 MC-ECHOLAB [facility]  03/10/2022  2:00 PM Bensimhon, Shaune Pascal, MD [place] None       Renee Ramus, [place] [facility] Paramedic  01/14/22

## 2022-01-21 ENCOUNTER — Other Ambulatory Visit (HOSPITAL_COMMUNITY): Payer: Self-pay | Admitting: Emergency Medicine

## 2022-01-21 NOTE — Progress Notes (Signed)
Paramedicine Encounter    Patient ID: Emily Key, female    DOB: 11/26/1970, 51 y.o.   MRN: [id number]   BP (!) 158/70 (BP Location: Left Arm, Patient Position: Sitting, Cuff Size: Normal)   Pulse 76   Wt 185 lb 6.4 oz (84.1 kg)   SpO2 97%   BMI 29.04 kg/m  Weight yesterday-did not weigh  Last visit weight-not taken   Home visit with Emily Key today.  A&O x 4, skin W&D w/ good color.  Pt denies chest pain or SOB.  Lung sounds clear and equal bilat. No peripheral edema, no JVD.  She was 100% compliant with all medications.  Med box reconciled.  She says she's feeling "great".  Gave her a scale and requested daily weights to be recorded.  Home visit complete.  Patient Care Team: Vevelyn Francois, NP as PCP - General (Adult Health Nurse Practitioner) Haroldine Laws Shaune Pascal, MD as PCP - Advanced Heart Failure (Cardiology) Jolaine Artist, MD as PCP - Cardiology (Cardiology)  Patient Active Problem List   Diagnosis Date Noted   Hyperlipidemia 12/30/2021   AKI (acute kidney injury) [facility])    Abdominal pain, epigastric    Chronic systolic CHF (congestive heart failure) ([place]) 11/19/2021   Cardiomyopathy ([place]) 12/22/2020   Vitamin D deficiency 02/22/2019   Bilateral impacted cerumen 01/21/2019   Seasonal allergies 01/21/2019   Hypertensive emergency 01/02/2019   Marijuana abuse 01/02/2019   Tobacco dependence 01/02/2019   Hyperglycemia 01/02/2019   Acute on chronic systolic CHF (congestive heart failure) ([place]) 01/02/2019   Seizure ([place]) 01/02/2019   Adnexal mass 02/18/2015   Elevated troponin 02/14/2015   Empyema lung (HCC)    H/O non-ST elevation myocardial infarction (NSTEMI)    Substance abuse ([place])    Accelerated hypertension 01/30/2015   Chronic kidney disease 11/28/2014   Hypertension 11/26/2014   Chest pain    Hypertensive urgency 11/11/2014    Current Outpatient Medications:    acetaminophen (TYLENOL) 500 MG tablet, Take 1,000 mg by mouth every 6 (six) hours as  needed for moderate pain or headache., Disp: , Rfl:    amLODipine (NORVASC) 5 MG tablet, Take 1 tablet (5 mg total) by mouth daily., Disp: 30 tablet, Rfl: 2   atorvastatin (LIPITOR) 20 MG tablet, Take 1 tablet (20 mg total) by mouth daily., Disp: 90 tablet, Rfl: 3   carvedilol (COREG) 12.5 MG tablet, Take 1 tablet (12.5 mg total) by mouth 2 (two) times daily with a meal., Disp: 60 tablet, Rfl: 1   dapagliflozin propanediol (FARXIGA) 10 MG TABS tablet, Take 1 tablet (10 mg total) by mouth daily., Disp: 90 tablet, Rfl: 3   furosemide (LASIX) 20 MG tablet, Take 1 tablet (20 mg total) by mouth daily., Disp: 90 tablet, Rfl: 3   hydrALAZINE (APRESOLINE) 100 MG tablet, Take 1 tablet (100 mg total) by mouth 3 (three) times daily., Disp: 270 tablet, Rfl: 3   isosorbide mononitrate (IMDUR) 30 MG 24 hr tablet, Take 2 tablets (60 mg total) by mouth daily., Disp: 180 tablet, Rfl: 3   citalopram (CELEXA) 10 MG tablet, Take 1 tablet (10 mg total) by mouth daily. (Patient not taking: Reported on 12/29/2021), Disp: 30 tablet, Rfl: 0 No Known Allergies    Social History   Socioeconomic History   Marital status: Divorced    Spouse name: Not on file   Number of children: Not on file   Years of education: Not on file   Highest education level: Not on file  Occupational  History   Not on file  Tobacco Use   Smoking status: Former    Packs/day: 0.33    Years: 10.00    Total pack years: 3.30    Types: Cigarettes    Quit date: 11/11/2014    Years since quitting: 7.2   Smokeless tobacco: Never  Substance and Sexual Activity   Alcohol use: Yes    Alcohol/week: 0.0 standard drinks of alcohol    Comment: 8/1/20176 "a couple beers a couple times/month"   Drug use: No   Sexual activity: Yes  Other Topics Concern   Not on file  Social History Narrative   Not on file   Social Determinants of Health   Financial Resource Strain: High Risk (12/23/2020)   Overall Financial Resource Strain (CARDIA)    Difficulty  of Paying Living Expenses: Hard  Food Insecurity: No Food Insecurity (12/08/2021)   Hunger Vital Sign    Worried About Running Out of Food in the Last Year: Never true    Ran Out of Food in the Last Year: Never true  Transportation Needs: Unmet Transportation Needs (12/08/2021)   PRAPARE - Hydrologist (Medical): Yes    Lack of Transportation (Non-Medical): Yes  Physical Activity: Not on file  Stress: Not on file  Social Connections: Not on file  Intimate Partner Violence: Not on file    Physical Exam      Future Appointments  Date Time Provider [place]  03/10/2022  1:00 PM [facility] ECHO OP 1 MC-ECHOLAB [facility]  03/10/2022  2:00 PM Bensimhon, Shaune Pascal, MD [place] None       Renee Ramus, [place] [facility] Paramedic  01/21/22

## 2022-02-04 ENCOUNTER — Telehealth (HOSPITAL_COMMUNITY): Payer: Self-pay | Admitting: Emergency Medicine

## 2022-02-04 NOTE — Telephone Encounter (Signed)
Called to schedule visit for today.  She states she is unavailable.  She advises she is good with her meds and has no needs at this time.  She states she will reach out the first of next week to schedule a visit. If I do not hear from her I will call her back

## 2022-02-11 ENCOUNTER — Other Ambulatory Visit (HOSPITAL_COMMUNITY): Payer: Self-pay | Admitting: Emergency Medicine

## 2022-02-11 ENCOUNTER — Telehealth (HOSPITAL_COMMUNITY): Payer: Self-pay | Admitting: Emergency Medicine

## 2022-02-11 ENCOUNTER — Other Ambulatory Visit: Payer: Self-pay

## 2022-02-11 NOTE — Progress Notes (Signed)
Paramedicine Encounter    Patient ID: Emily Key, female    DOB: 09-Aug-1970, 51 y.o.   MRN: [id number]   Wt 184 lb 12.8 oz (83.8 kg)   BMI 28.94 kg/m  Weight yesterday-not taken Last visit weight-185lb  ATF Ms. Vo A&O x 4, skin W&D w/ good color.  Pt. Reports to be feeling well.  No complaints of chest pain or SOB.  Lung sounds clear and equal bilat. No edema to ankles, no JVD.  Pt. Reconciled her own meds prior to my arrival.  Reviewed for accuracy and everything looks good.  We discussed beginning home visits every two weeks.  Called in refill for Amlodopine to [place]. Home visit complete. Patient Care Team: Vevelyn Francois, NP as PCP - General (Adult Health Nurse Practitioner) Haroldine Laws Shaune Pascal, MD as PCP - Advanced Heart Failure (Cardiology) Jolaine Artist, MD as PCP - Cardiology (Cardiology)  Patient Active Problem List   Diagnosis Date Noted   Hyperlipidemia 12/30/2021   AKI (acute kidney injury) [facility])    Abdominal pain, epigastric    Chronic systolic CHF (congestive heart failure) ([place]) 11/19/2021   Cardiomyopathy ([place]) 12/22/2020   Vitamin D deficiency 02/22/2019   Bilateral impacted cerumen 01/21/2019   Seasonal allergies 01/21/2019   Hypertensive emergency 01/02/2019   Marijuana abuse 01/02/2019   Tobacco dependence 01/02/2019   Hyperglycemia 01/02/2019   Acute on chronic systolic CHF (congestive heart failure) ([place]) 01/02/2019   Seizure ([place]) 01/02/2019   Adnexal mass 02/18/2015   Elevated troponin 02/14/2015   Empyema lung (HCC)    H/O non-ST elevation myocardial infarction (NSTEMI)    Substance abuse ([place])    Accelerated hypertension 01/30/2015   Chronic kidney disease 11/28/2014   Hypertension 11/26/2014   Chest pain    Hypertensive urgency 11/11/2014    Current Outpatient Medications:    acetaminophen (TYLENOL) 500 MG tablet, Take 1,000 mg by mouth every 6 (six) hours as needed for moderate pain or headache., Disp: , Rfl:    amLODipine  (NORVASC) 5 MG tablet, Take 1 tablet (5 mg total) by mouth daily., Disp: 30 tablet, Rfl: 2   atorvastatin (LIPITOR) 20 MG tablet, Take 1 tablet (20 mg total) by mouth daily., Disp: 90 tablet, Rfl: 3   carvedilol (COREG) 12.5 MG tablet, Take 1 tablet (12.5 mg total) by mouth 2 (two) times daily with a meal., Disp: 60 tablet, Rfl: 1   dapagliflozin propanediol (FARXIGA) 10 MG TABS tablet, Take 1 tablet (10 mg total) by mouth daily., Disp: 90 tablet, Rfl: 3   furosemide (LASIX) 20 MG tablet, Take 1 tablet (20 mg total) by mouth daily., Disp: 90 tablet, Rfl: 3   hydrALAZINE (APRESOLINE) 100 MG tablet, Take 1 tablet (100 mg total) by mouth 3 (three) times daily., Disp: 270 tablet, Rfl: 3   isosorbide mononitrate (IMDUR) 30 MG 24 hr tablet, Take 2 tablets (60 mg total) by mouth daily., Disp: 180 tablet, Rfl: 3   citalopram (CELEXA) 10 MG tablet, Take 1 tablet (10 mg total) by mouth daily. (Patient not taking: Reported on 12/29/2021), Disp: 30 tablet, Rfl: 0 No Known Allergies    Social History   Socioeconomic History   Marital status: Divorced    Spouse name: Not on file   Number of children: Not on file   Years of education: Not on file   Highest education level: Not on file  Occupational History   Not on file  Tobacco Use   Smoking status: Former    Packs/day:  0.33    Years: 10.00    Total pack years: 3.30    Types: Cigarettes    Quit date: 11/11/2014    Years since quitting: 7.2   Smokeless tobacco: Never  Substance and Sexual Activity   Alcohol use: Yes    Alcohol/week: 0.0 standard drinks of alcohol    Comment: 8/1/20176 "a couple beers a couple times/month"   Drug use: No   Sexual activity: Yes  Other Topics Concern   Not on file  Social History Narrative   Not on file   Social Determinants of Health   Financial Resource Strain: High Risk (12/23/2020)   Overall Financial Resource Strain (CARDIA)    Difficulty of Paying Living Expenses: Hard  Food Insecurity: No Food  Insecurity (12/08/2021)   Hunger Vital Sign    Worried About Running Out of Food in the Last Year: Never true    Ran Out of Food in the Last Year: Never true  Transportation Needs: Unmet Transportation Needs (12/08/2021)   PRAPARE - Hydrologist (Medical): Yes    Lack of Transportation (Non-Medical): Yes  Physical Activity: Not on file  Stress: Not on file  Social Connections: Not on file  Intimate Partner Violence: Not on file    Physical Exam      Future Appointments  Date Time Provider [place]  03/10/2022  1:00 PM [facility] ECHO OP 1 MC[facility]ECHOLAB [facility]  03/10/2022  2:00 PM Bensimhon, Shaune Pascal, MD [place] None       Renee Ramus, [place] Paramedic  02/11/22

## 2022-02-11 NOTE — Telephone Encounter (Signed)
Spoke with Emily Key this morning and scheduled visit for 2:30 today.

## 2022-02-17 ENCOUNTER — Other Ambulatory Visit: Payer: Self-pay

## 2022-03-01 ENCOUNTER — Telehealth (HOSPITAL_COMMUNITY): Payer: Self-pay | Admitting: Emergency Medicine

## 2022-03-01 NOTE — Telephone Encounter (Signed)
Called to check in with Emily Key.  She reports to be doing well.  She is maintaining her weight and taking her meds as scheduled.  I reminded her of her appointment 8/31 @ 2:00 with Dr. Haroldine Laws and told her I would be present for that visit and she confirmed same.

## 2022-03-09 ENCOUNTER — Other Ambulatory Visit (HOSPITAL_COMMUNITY): Payer: Self-pay | Admitting: *Deleted

## 2022-03-09 DIAGNOSIS — I5042 Chronic combined systolic (congestive) and diastolic (congestive) heart failure: Secondary | ICD-10-CM

## 2022-03-10 ENCOUNTER — Ambulatory Visit (HOSPITAL_BASED_OUTPATIENT_CLINIC_OR_DEPARTMENT_OTHER)
Admission: RE | Admit: 2022-03-10 | Discharge: 2022-03-10 | Disposition: A | Discharge status: Home / Self Care | Payer: Commercial Managed Care - HMO | Source: Ambulatory Visit | Attending: Internal Medicine | Admitting: Internal Medicine

## 2022-03-10 ENCOUNTER — Ambulatory Visit (HOSPITAL_COMMUNITY)
Admission: RE | Admit: 2022-03-10 | Discharge: 2022-03-10 | Disposition: A | Discharge status: Home / Self Care | Payer: Commercial Managed Care - HMO | Source: Ambulatory Visit | Attending: Nurse Practitioner | Admitting: Nurse Practitioner

## 2022-03-10 ENCOUNTER — Other Ambulatory Visit (HOSPITAL_COMMUNITY): Payer: Self-pay | Admitting: Emergency Medicine

## 2022-03-10 ENCOUNTER — Encounter (HOSPITAL_COMMUNITY): Payer: Self-pay | Admitting: Internal Medicine

## 2022-03-10 VITALS — BP 130/88 | HR 85 | Wt 188.4 lb

## 2022-03-10 DIAGNOSIS — I2510 Atherosclerotic heart disease of native coronary artery without angina pectoris: Secondary | ICD-10-CM

## 2022-03-10 DIAGNOSIS — Z87891 Personal history of nicotine dependence: Secondary | ICD-10-CM | POA: Insufficient documentation

## 2022-03-10 DIAGNOSIS — I3139 Other pericardial effusion (noninflammatory): Secondary | ICD-10-CM | POA: Diagnosis not present

## 2022-03-10 DIAGNOSIS — I272 Pulmonary hypertension, unspecified: Secondary | ICD-10-CM | POA: Insufficient documentation

## 2022-03-10 DIAGNOSIS — I428 Other cardiomyopathies: Secondary | ICD-10-CM | POA: Diagnosis not present

## 2022-03-10 DIAGNOSIS — Z7901 Long term (current) use of anticoagulants: Secondary | ICD-10-CM | POA: Diagnosis not present

## 2022-03-10 DIAGNOSIS — I110 Hypertensive heart disease with heart failure: Secondary | ICD-10-CM | POA: Insufficient documentation

## 2022-03-10 DIAGNOSIS — I5042 Chronic combined systolic (congestive) and diastolic (congestive) heart failure: Secondary | ICD-10-CM | POA: Diagnosis not present

## 2022-03-10 DIAGNOSIS — I5022 Chronic systolic (congestive) heart failure: Secondary | ICD-10-CM | POA: Diagnosis not present

## 2022-03-10 DIAGNOSIS — Z79899 Other long term (current) drug therapy: Secondary | ICD-10-CM | POA: Insufficient documentation

## 2022-03-10 DIAGNOSIS — I10 Essential (primary) hypertension: Secondary | ICD-10-CM

## 2022-03-10 DIAGNOSIS — Z7984 Long term (current) use of oral hypoglycemic drugs: Secondary | ICD-10-CM | POA: Insufficient documentation

## 2022-03-10 LAB — ECHOCARDIOGRAM COMPLETE
AR max vel: 2.75 cm2
AV Peak grad: 7.9 mmHg
Ao pk vel: 1.41 m/s
Area-P 1/2: 4.29 cm2
S' Lateral: 3.9 cm

## 2022-03-10 LAB — BASIC METABOLIC PANEL
Anion gap: 7 (ref 5–15)
BUN: 18 mg/dL (ref 6–20)
CO2: 28 mmol/L (ref 22–32)
Calcium: 9.3 mg/dL (ref 8.9–10.3)
Chloride: 103 mmol/L (ref 98–111)
Creatinine, Ser: 1.17 mg/dL — ABNORMAL HIGH (ref 0.44–1.00)
GFR, Estimated: 56 mL/min — ABNORMAL LOW (ref >60)
Glucose, Bld: 114 mg/dL — ABNORMAL HIGH (ref 70–99)
Potassium: 4.7 mmol/L (ref 3.5–5.1)
Sodium: 138 mmol/L (ref 135–145)

## 2022-03-10 LAB — BRAIN NATRIURETIC PEPTIDE: B Natriuretic Peptide: 151.9 pg/mL — ABNORMAL HIGH (ref 0.0–100.0)

## 2022-03-10 NOTE — Patient Instructions (Signed)
Good to see you today!  No changes in your medications  Labs done today, your results will be available in MyChart, we will contact you for abnormal readings.  Your physician recommends that you schedule a follow-up appointment in: 3-4 months with app clinic   If you have any questions or concerns before your next appointment please send [place] a message through [place] or call our office at [phone].    TO LEAVE A MESSAGE FOR THE NURSE SELECT OPTION 2, PLEASE LEAVE A MESSAGE INCLUDING: YOUR NAME DATE OF BIRTH CALL BACK NUMBER REASON FOR CALL**this is important as we prioritize the call backs  YOU WILL RECEIVE A CALL BACK THE SAME DAY AS LONG AS YOU CALL BEFORE 4:00 PM  At the [facility], you and your health needs are our priority. As part of our continuing mission to provide you with exceptional heart care, we have created designated Provider Care Teams. These Care Teams include your primary Cardiologist (physician) and Advanced Practice Providers (APPs- Physician Assistants and Nurse Practitioners) who all work together to provide you with the care you need, when you need it.   You may see any of the following providers on your designated Care Team at your next follow up: Dr Glori Bickers Dr Loralie Champagne Dr. Roxana Hires, NP Lyda Jester, [place] [facility] [place], [place] Forestine Na, NP Audry Riles, PharmD   Please be sure to bring in all your medications bottles to every appointment.

## 2022-03-10 NOTE — Progress Notes (Signed)
Advanced Heart Failure Clinic Note   PCP: Vevelyn Francois, NP PCP-Cardiologist: Glori Bickers, MD  HF Cardiologist: Dr. Haroldine Laws  HPI: 51 y.o.AAF w/ h/o HTN and chronic systolic heart failure. In May 2016, she underwent left VATS w/ drainage of empyema/ abscess and decortication by Dr. Roxan Hockey. Echocardiogram showed normal EF 50-55%.   Admitted 8/16 w/ gastroenteritis/ sepsis and HTN urgency. Cardiology consulted for elevated troponin. Echo showed mildly reduced LVEF 40-45%, RV normal. LHC showed normal coronaries. Drop in EF felt to be stress induced   Presented to ED on 11/20/20 w/ CP and exertional dyspnea. She had stopped taking all of her BP meds BP 220/137. She was admitted and restarted on PO antihypertensives and IV Lasix. Echo EF, 20-25%, GIIIDD, no LVH, Global HK, RV mildly reduced, RVSP 60 mmHg, mod MR, Mod TR. cMRI showed severely reduced LVEF, moderately reduced RV, small pericardia effusion  Admitted 5/23 with CP and hypertensive emergency, in setting of medication non compliance. BP 198/131. Echo EF 20-25%, severe LV dysfunction with global HK, RV ok, circumferential pericardial effusion.   Returns for f/u with [facility]. Feels much better. Compliant with meds. SBP mostly 130s but has been as low as 102.Marland Kitchen Denies CP, SOB, edema, orthopnea or PND.   Echo today 03/10/22 EF 55-60% G1DD Personally reviewed   Cardiac Studies  - Echo (5/23): EF 20-25%, severely depressed LV with global HK, mild yLVH, RV ok, circumferential pericardial effusion, IV small.  - Echo (6/22): LVEF 20-25%, GIIIDD, no LVH, Global HK, RV mildly reduced, RVSP 60 mmHg, mod MR, Mod TR  - Echo (6/20): LVEF 40-45%, RV mildly reduced - Echo (8/16): LVEF 40-45%, AK of apical myocardium, G2DD, RV normal - Echo (5/16):  LVEF 50-55%, RV normal  - LHC (2016): normal coronaries   - R/LHC (6/16): Prox LAD lesion is 45% stenosed.   Findings:   Ao = 177/94 (126) LV = 176/18 RA = 5 RV =  64/9 PA = 64/17 (35) PCW = 18 Fick cardiac output/index = 5.0/2.8 PVR = 3.4 WU Ao sat = 97% PA sat = 68%, 69%   Assessment: 1. Mild CAD 2. Severe NICM likely due to HTN CM 3. 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 Left-sided pressures relatively well compemsated.   - Renal artery u/s (6/22): no RAS or FMD   - cMRI (6/22): Severely reduced LV systolic function with moderate LV dilation, LVEF 22%. Global hypokinesis. Moderately reduced RV systolic function with mild RV enlargement. RVEF 35%. Delayed myocardial enhancement in the superior and inferior RV insertion points, with extension to the midmyocardium in the antero- and inferoseptum. May represent increased pulmartery pressure. Small circumferential pericardial effusion. Findings most consistent with hypertensive heart disease and pulmonary hypertension.  ROS: All systems reviewed and negative except as per HPI.   Past Medical History:  Diagnosis Date   Cerumen impaction 01/2019   CHF (congestive heart failure) (HCC)    after last pregnancy   Daily headache    Heart murmur    "born w/one":   History of hiatal hernia    Hyperlipidemia 01/2019   Hypertension    Hypertensive emergency 01/02/2019   Observed seizure-like activity ([place]) 01/02/2019   Pneumonia 11/2014   Seasonal allergies 01/2019   Vitamin D deficiency 01/2019   Current Outpatient Medications  Medication Sig Dispense Refill   acetaminophen (TYLENOL) 500 MG tablet Take 1,000 mg by mouth every 6 (six) hours as needed for moderate pain or headache.     amLODipine (NORVASC) 5  MG tablet Take 1 tablet (5 mg total) by mouth daily. 30 tablet 2   atorvastatin (LIPITOR) 20 MG tablet Take 1 tablet (20 mg total) by mouth daily. 90 tablet 3   carvedilol (COREG) 12.5 MG tablet Take 1 tablet (12.5 mg total) by mouth 2 (two) times daily with a meal. 60 tablet 1   dapagliflozin propanediol (FARXIGA) 10 MG TABS tablet Take 1 tablet (10 mg total) by mouth daily. 90 tablet 3    furosemide (LASIX) 20 MG tablet Take 1 tablet (20 mg total) by mouth daily. 90 tablet 3   hydrALAZINE (APRESOLINE) 100 MG tablet Take 1 tablet (100 mg total) by mouth 3 (three) times daily. 270 tablet 3   isosorbide mononitrate (IMDUR) 30 MG 24 hr tablet Take 2 tablets (60 mg total) by mouth daily. 180 tablet 3   No current facility-administered medications for this encounter.   No Known Allergies  Social History   Socioeconomic History   Marital status: Divorced    Spouse name: Not on file   Number of children: Not on file   Years of education: Not on file   Highest education level: Not on file  Occupational History   Not on file  Tobacco Use   Smoking status: Former    Packs/day: 0.33    Years: 10.00    Total pack years: 3.30    Types: Cigarettes    Quit date: 11/11/2014    Years since quitting: 7.3   Smokeless tobacco: Never  Substance and Sexual Activity   Alcohol use: Yes    Alcohol/week: 0.0 standard drinks of alcohol    Comment: 8/1/20176 "a couple beers a couple times/month"   Drug use: No   Sexual activity: Yes  Other Topics Concern   Not on file  Social History Narrative   Not on file   Social Determinants of Health   Financial Resource Strain: High Risk (12/23/2020)   Overall Financial Resource Strain (CARDIA)    Difficulty of Paying Living Expenses: Hard  Food Insecurity: No Food Insecurity (12/08/2021)   Hunger Vital Sign    Worried About Running Out of Food in the Last Year: Never true    Ran Out of Food in the Last Year: Never true  Transportation Needs: Unmet Transportation Needs (12/08/2021)   PRAPARE - Hydrologist (Medical): Yes    Lack of Transportation (Non-Medical): Yes  Physical Activity: Not on file  Stress: Not on file  Social Connections: Not on file  Intimate Partner Violence: Not on file   Family History  Problem Relation Age of Onset   Diabetes Mellitus II Mother    Hypertension Mother    Lung cancer  Father    BP 130/88   Pulse 85   Wt 85.5 kg (188 lb 6.4 oz)   SpO2 98%   BMI 29.51 kg/m   Wt Readings from Last 3 Encounters:  03/10/22 85.3 kg (188 lb)  03/10/22 85.5 kg (188 lb 6.4 oz)  02/11/22 83.8 kg (184 lb 12.8 oz)   PHYSICAL EXAM: General:  Well appearing. No resp difficulty HEENT: normal Neck: supple. no JVD. Carotids 2+ bilat; no bruits. No lymphadenopathy or thryomegaly appreciated. Cor: PMI nondisplaced. Regular rate & rhythm. No rubs, gallops or murmurs. Lungs: clear decreased throughout Abdomen: soft, nontender, nondistended. No hepatosplenomegaly. No bruits or masses. Good bowel sounds. Extremities: no cyanosis, clubbing, rash, edema Neuro: alert & orientedx3, cranial nerves grossly intact. moves all 4 extremities w/o difficulty. Affect pleasant  ASSESSMENT & PLAN: Chronic Systolic Heart Failure with recovered EF - NICM. LHC in 2016 showed normal coronaries - Echo 11/2014:  LVEF 50-55%, RV normal - Echo (6/22): EF 20-25%, RV mildly reduced, in the setting of poorly controlled HTN/ hypertensive emergency  - L/RHC (6/22) w/ mild CAD. Suspect CM 2/2 HTN, RHC after diureses showed well compensated left sided filling pressures - cMRI (6/22): LV EF 22% severely reduced, RVEF 35% moderately down, small circumferential pericardial effusion, findings consistent with hypertensive heart disease and pulmonary HTN. - Echo (5/23) EF 20-25%, RV ok - Echo today 03/10/22 EF 55-60% G1DD Personally reviewed - NYHA II. Volume is good today. - Continue Imdur 60 mg daily. - Continue Farxiga 10 mg daily. - Continue Coreg 12.5 mg bid.  - Continue hydralazine 100 mg tid. - Off Delene Loll and Spiro due to AKI  - Last SCr back down to 0.9 - Doing really well. EF has recovered with BP control. Ideally would like to get her back on Entresto and/or spiro but with soft BP and previous non-compliance, I am hesitant to make changed especially as EF has recovered on this regimen. If complaince  remains high can consider stopping amlodipine and adding spiro at next visit.    2. Severe HTN - Much better control - Blood pressure well controlled. Continue current regimen.   3. MR - Moderate on echo 6/22, likely functional in setting of dilated LV/RV. - Mild on echo now   4. Pulmonary HTN - RVSP severely elevated on echo (6/22), 60 mHg. - Suspect primarily 2/2 left sided heart disease/ acute CHF w/ elevated filling pressures. - RHC (6/22) w/ mild to moderate mixed pulmonary HTN. - No OSA on sleep study 9/22.   5. H/o tobacco abuse - She remains quit though does smell like tobacco on exam    6. CAD - LAD 45%. - No s/s angina - Continue statin. Lipids followed by PCP  7. H/o AKI - Resolved. Repeat labs today.    Glori Bickers, MD 03/10/22

## 2022-03-10 NOTE — Addendum Note (Signed)
Encounter addended by: Stanford Scotland, RN on: 03/10/2022 2:41 PM  Actions taken: Visit diagnoses modified, Order list changed, Diagnosis association updated, Charge Capture section accepted, Clinical Note Signed

## 2022-03-10 NOTE — Progress Notes (Signed)
Paramedicine Encounter   Patient ID: Emily Key , female,   DOB: 04/25/1971,50 y.o.,  MRN: [id number]   Met patient in clinic today with provider.  Time spent with patient 50 minutes  ATF [name], A&O x 4, skin W&D w/ good color.  Pt being seen today for an ECHO and visit with Dr. Sung Amabile.  Pt had good report from her ECHO today.  EF 55-60% and Dr. Sung Amabile states he was very pleased with her progress.  He stated he would like to get her on Entresto and/or Arlyce Harman but w/ soft BP he doesn't want to make any changes at this time.   I will be seeing [name] every two weeks and Dr. Sung Amabile stated that would be fine.  He encouraged her to keep up the good work.  I will keep an eye on her BP in future visits.  Renee Ramus, [place] 03/10/2022

## 2022-03-24 ENCOUNTER — Other Ambulatory Visit (HOSPITAL_COMMUNITY): Payer: Self-pay | Admitting: Emergency Medicine

## 2022-03-24 NOTE — Progress Notes (Signed)
Pt was not home for our scheduled visit.  Her sister was there and I advised her that I would reach out the first of the week to reschedule home visit.    Renee Ramus, [place] 03/24/2022

## 2022-04-01 ENCOUNTER — Other Ambulatory Visit (HOSPITAL_COMMUNITY): Payer: Self-pay | Admitting: Emergency Medicine

## 2022-04-01 NOTE — Progress Notes (Signed)
Pt was scheduled for home visit today at 11:30.  I spoke with her by phone and she advised she thought it was 2:30.  I was able to work her in at 2:30 today.  I arrived at her residence and her sister advised she was out taking her son to work.  I advised her that we would have to reschedule a home visit for next week.    Renee Ramus, [place] 04/01/2022

## 2022-04-15 ENCOUNTER — Other Ambulatory Visit (HOSPITAL_COMMUNITY): Payer: Self-pay | Admitting: Emergency Medicine

## 2022-04-15 ENCOUNTER — Telehealth (HOSPITAL_COMMUNITY): Payer: Self-pay | Admitting: Emergency Medicine

## 2022-04-15 NOTE — Progress Notes (Signed)
Patient is now discharged from [facility].  Patient has/has not met the following goals:  Yes :Patient expresses basic understanding of medications and what they are for Yes :Patient able to verbalize heart failure specific dietary/fluid restrictions Yes :Patient is aware of who to call if they have medical concerns or if they need to schedule or change appts Yes :Patient has a scale for daily weights and weighs regularly Yes :Patient able to verbalize concerning symptoms when they should call the HF clinic (weight gain ranges, etc) No :Patient has a PCP and has seen within the past year or has upcoming appt Yes :Patient has reliable access to getting their medications Yes :Patient has shown they are able to reorder medications reliably No :Patient has had admission in past 30 days- if yes how many? No :Patient has had admission in past 90 days- if yes how many?  Discharge Comments: Emily Key has done well in the paramedicine program.  She is compliant with her medications.  She had a good report from Dr. Haroldine Laws at her last clinic visit.  I have attempted home visits on two occasions and she has not been home.    I called her today to discuss discharge but no answer and her voicemail has not been set up.  I am discharging her at this time as I feel she is doing well independently.  I can be available for her should he require my assistance in the future.    Renee Ramus, [place] 04/15/2022

## 2022-04-15 NOTE — Telephone Encounter (Signed)
Attempted to call Ms. Wiler to discuss discharge.  Her voicemail is not set up so I could not leave a message.    Renee Ramus, [place] 04/15/2022

## 2022-04-21 ENCOUNTER — Telehealth (HOSPITAL_COMMUNITY): Payer: Self-pay | Admitting: Emergency Medicine

## 2022-04-21 ENCOUNTER — Other Ambulatory Visit (HOSPITAL_COMMUNITY): Payer: Self-pay | Admitting: Emergency Medicine

## 2022-04-21 NOTE — Progress Notes (Signed)
Patient is now discharged from [facility].  Patient has/has not met the following goals:  Yes :Patient expresses basic understanding of medications and what they are for Yes :Patient able to verbalize heart failure specific dietary/fluid restrictions Yes :Patient is aware of who to call if they have medical concerns or if they need to schedule or change appts Yes :Patient has a scale for daily weights and weighs regularly Yes :Patient able to verbalize concerning symptoms when they should call the HF clinic (weight gain ranges, etc) No :Patient has a PCP and has seen within the past year or has upcoming appt Yes :Patient has reliable access to getting their medications Yes :Patient has shown they are able to reorder medications reliably No :Patient has had admission in past 30 days- if yes how many? No :Patient has had admission in past 90 days- if yes how many?  Discharge Comments: Emily Key has done well to take her medications and make her doctor's visits at the HF Clinic.  She received a good report from Dr. Haroldine Laws  03/10/22.  I have had several appointments scheduled with her but she hasn't been home when I arrived.   I will reach out and encourage her to make an appointment with her PCP.    Renee Ramus, [place] 04/21/2022

## 2022-04-21 NOTE — Telephone Encounter (Signed)
Attempted to call Emily Key.  Her voicemail has not been set up so could not leave message.  Need to inform her that she is being discharged from the Paramedicine program but she does need to schedule an appointment with her PCP in the near future.    Renee Ramus, [place] 04/21/2022

## 2022-06-08 ENCOUNTER — Telehealth (HOSPITAL_COMMUNITY): Payer: Self-pay

## 2022-06-08 NOTE — Telephone Encounter (Signed)
Called to confirm/remind patient of their appointment at the [facility] on 06/09/22.   Patient reminded to bring all medications and/or complete list.  Confirmed patient has transportation. Gave directions, instructed to utilize [facility] parking.  Confirmed appointment prior to ending call.

## 2022-06-09 ENCOUNTER — Encounter (HOSPITAL_COMMUNITY): Payer: Commercial Managed Care - HMO

## 2022-06-09 ENCOUNTER — Encounter (HOSPITAL_COMMUNITY): Payer: Self-pay

## 2022-06-09 NOTE — Progress Notes (Incomplete)
error 

## 2022-06-22 ENCOUNTER — Other Ambulatory Visit: Payer: Self-pay

## 2022-06-28 ENCOUNTER — Other Ambulatory Visit: Payer: Self-pay

## 2023-01-11 ENCOUNTER — Observation Stay (HOSPITAL_COMMUNITY)
Admission: EM | Admit: 2023-01-11 | Discharge: 2023-01-12 | Disposition: A | Discharge status: Home / Self Care | Payer: BLUE CROSS/BLUE SHIELD | Attending: Student | Admitting: Student

## 2023-01-11 ENCOUNTER — Other Ambulatory Visit: Payer: Self-pay

## 2023-01-11 ENCOUNTER — Emergency Department (HOSPITAL_COMMUNITY): Payer: BLUE CROSS/BLUE SHIELD

## 2023-01-11 ENCOUNTER — Encounter (HOSPITAL_COMMUNITY): Payer: Self-pay | Admitting: *Deleted

## 2023-01-11 DIAGNOSIS — Z87891 Personal history of nicotine dependence: Secondary | ICD-10-CM | POA: Diagnosis not present

## 2023-01-11 DIAGNOSIS — I5042 Chronic combined systolic (congestive) and diastolic (congestive) heart failure: Secondary | ICD-10-CM | POA: Diagnosis present

## 2023-01-11 DIAGNOSIS — I110 Hypertensive heart disease with heart failure: Secondary | ICD-10-CM | POA: Insufficient documentation

## 2023-01-11 DIAGNOSIS — R200 Anesthesia of skin: Secondary | ICD-10-CM

## 2023-01-11 DIAGNOSIS — Z79899 Other long term (current) drug therapy: Secondary | ICD-10-CM | POA: Diagnosis not present

## 2023-01-11 DIAGNOSIS — I5022 Chronic systolic (congestive) heart failure: Secondary | ICD-10-CM | POA: Diagnosis not present

## 2023-01-11 DIAGNOSIS — R079 Chest pain, unspecified: Secondary | ICD-10-CM | POA: Diagnosis present

## 2023-01-11 DIAGNOSIS — M79605 Pain in left leg: Secondary | ICD-10-CM

## 2023-01-11 DIAGNOSIS — I160 Hypertensive urgency: Secondary | ICD-10-CM | POA: Diagnosis not present

## 2023-01-11 DIAGNOSIS — R0789 Other chest pain: Secondary | ICD-10-CM | POA: Diagnosis present

## 2023-01-11 DIAGNOSIS — I161 Hypertensive emergency: Secondary | ICD-10-CM | POA: Insufficient documentation

## 2023-01-11 DIAGNOSIS — E785 Hyperlipidemia, unspecified: Secondary | ICD-10-CM | POA: Diagnosis present

## 2023-01-11 DIAGNOSIS — I2510 Atherosclerotic heart disease of native coronary artery without angina pectoris: Secondary | ICD-10-CM | POA: Diagnosis not present

## 2023-01-11 LAB — PROTIME-INR
INR: 1 (ref 0.8–1.2)
Prothrombin Time: 13.2 seconds (ref 11.4–15.2)

## 2023-01-11 LAB — ETHANOL: Alcohol, Ethyl (B): <10 mg/dL (ref <10)

## 2023-01-11 LAB — I-STAT CHEM 8, ED
BUN: 21 mg/dL — ABNORMAL HIGH (ref 6–20)
Calcium, Ion: 1.16 mmol/L (ref 1.15–1.40)
Chloride: 103 mmol/L (ref 98–111)
Creatinine, Ser: 1.4 mg/dL — ABNORMAL HIGH (ref 0.44–1.00)
Glucose, Bld: 120 mg/dL — ABNORMAL HIGH (ref 70–99)
HCT: 43 % (ref 36.0–46.0)
Hemoglobin: 14.6 g/dL (ref 12.0–15.0)
Potassium: 3.8 mmol/L (ref 3.5–5.1)
Sodium: 140 mmol/L (ref 135–145)
TCO2: 28 mmol/L (ref 22–32)

## 2023-01-11 LAB — DIFFERENTIAL
Abs Immature Granulocytes: 0.01 10*3/uL (ref 0.00–0.07)
Basophils Absolute: 0.1 10*3/uL (ref 0.0–0.1)
Basophils Relative: 1 %
Eosinophils Absolute: 0.4 10*3/uL (ref 0.0–0.5)
Eosinophils Relative: 5 %
Immature Granulocytes: 0 %
Lymphocytes Relative: 36 %
Lymphs Abs: 2.9 10*3/uL (ref 0.7–4.0)
Monocytes Absolute: 0.5 10*3/uL (ref 0.1–1.0)
Monocytes Relative: 6 %
Neutro Abs: 4.2 10*3/uL (ref 1.7–7.7)
Neutrophils Relative %: 52 %

## 2023-01-11 LAB — COMPREHENSIVE METABOLIC PANEL
ALT: 12 U/L (ref 0–44)
AST: 22 U/L (ref 15–41)
Albumin: 4 g/dL (ref 3.5–5.0)
Alkaline Phosphatase: 56 U/L (ref 38–126)
Anion gap: 10 (ref 5–15)
BUN: 17 mg/dL (ref 6–20)
CO2: 25 mmol/L (ref 22–32)
Calcium: 9.4 mg/dL (ref 8.9–10.3)
Chloride: 103 mmol/L (ref 98–111)
Creatinine, Ser: 1.26 mg/dL — ABNORMAL HIGH (ref 0.44–1.00)
GFR, Estimated: 51 mL/min — ABNORMAL LOW (ref >60)
Glucose, Bld: 127 mg/dL — ABNORMAL HIGH (ref 70–99)
Potassium: 3.9 mmol/L (ref 3.5–5.1)
Sodium: 138 mmol/L (ref 135–145)
Total Bilirubin: 0.6 mg/dL (ref 0.3–1.2)
Total Protein: 7.9 g/dL (ref 6.5–8.1)

## 2023-01-11 LAB — CBC
HCT: 42.8 % (ref 36.0–46.0)
Hemoglobin: 13.7 g/dL (ref 12.0–15.0)
MCH: 27.1 pg (ref 26.0–34.0)
MCHC: 32 g/dL (ref 30.0–36.0)
MCV: 84.8 fL (ref 80.0–100.0)
Platelets: 247 10*3/uL (ref 150–400)
RBC: 5.05 MIL/uL (ref 3.87–5.11)
RDW: 14.5 % (ref 11.5–15.5)
WBC: 8 10*3/uL (ref 4.0–10.5)
nRBC: 0 % (ref 0.0–0.2)

## 2023-01-11 LAB — APTT: aPTT: 31 seconds (ref 24–36)

## 2023-01-11 LAB — CBG MONITORING, ED: Glucose-Capillary: 130 mg/dL — ABNORMAL HIGH (ref 70–99)

## 2023-01-11 LAB — TROPONIN I (HIGH SENSITIVITY): Troponin I (High Sensitivity): 28 ng/L — ABNORMAL HIGH (ref <18)

## 2023-01-11 MED ORDER — HYDRALAZINE HCL 25 MG PO TABS
100.0000 mg | ORAL_TABLET | Freq: Once | ORAL | Status: AC
  Administered 2023-01-11: 100 mg via ORAL
  Filled 2023-01-11: qty 4

## 2023-01-11 MED ORDER — MORPHINE SULFATE (PF) 4 MG/ML IV SOLN
4.0000 mg | Freq: Once | INTRAVENOUS | Status: AC
  Administered 2023-01-11: 4 mg via INTRAVENOUS
  Filled 2023-01-11: qty 1

## 2023-01-11 MED ORDER — LABETALOL HCL 5 MG/ML IV SOLN
10.0000 mg | Freq: Once | INTRAVENOUS | Status: AC
  Administered 2023-01-11: 10 mg via INTRAVENOUS
  Filled 2023-01-11: qty 4

## 2023-01-11 MED ORDER — SODIUM CHLORIDE 0.9% FLUSH: 3.0000 mL | Freq: Once | INTRAVENOUS | Status: DC

## 2023-01-11 MED ORDER — FUROSEMIDE 10 MG/ML IJ SOLN
40.0000 mg | Freq: Once | INTRAMUSCULAR | Status: AC
  Administered 2023-01-11: 40 mg via INTRAVENOUS
  Filled 2023-01-11: qty 4

## 2023-01-11 MED ORDER — AMLODIPINE BESYLATE 5 MG PO TABS
5.0000 mg | ORAL_TABLET | Freq: Once | ORAL | Status: AC
  Administered 2023-01-11: 5 mg via ORAL
  Filled 2023-01-11: qty 1

## 2023-01-11 MED ORDER — HYDRALAZINE HCL 20 MG/ML IJ SOLN
10.0000 mg | Freq: Once | INTRAMUSCULAR | Status: AC
  Administered 2023-01-11: 10 mg via INTRAVENOUS
  Filled 2023-01-11: qty 1

## 2023-01-11 MED ORDER — NITROGLYCERIN IN D5W 200-5 MCG/ML-% IV SOLN: 0.0000 ug/min | INTRAVENOUS | Status: DC

## 2023-01-11 NOTE — ED Notes (Signed)
Patient transported to MRI 

## 2023-01-11 NOTE — ED Triage Notes (Signed)
Pt arrived with left sided numbness that started yesterday. Today woke with with pain and numbness to L leg . Pt has sensory deficit to left side of body; increased pain to Left leg on exam. Reports noncompliance with bp medication at home due to diuretic; has been more compliant within the past week. Pt reports chest pain with diaphoresis that has resolved on arrival

## 2023-01-11 NOTE — ED Provider Notes (Cosign Needed)
Emily Key Provider Note   CSN: [id number] Arrival date & time: 01/11/23  1859     History  Chief Complaint  Patient presents with   Numbness    Emily Key is a 52 y.o. female past medical history significant for hypertension, CKD, NSTEMI, alcohol, substance abuse, although she denies any current use, tobacco abuse although patient reports that she is also tobacco free at this time for several months, CHF, previous seizure presents with concern for left-sided numbness that started yesterday, as well as some pain and numbness to left leg, she endorses some sensory deficit to entire left side including groin, buttocks, reports chest pain with diaphoresis that was resolved on arrival but now present again at time of evaluation.  Denies any shortness of breath.  HPI     Home Medications Prior to Admission medications   Medication Sig Start Date End Date Taking? Authorizing Provider  acetaminophen (TYLENOL) 500 MG tablet Take 1,000 mg by mouth every 6 (six) hours as needed for moderate pain or headache.    [provider]  amLODipine (NORVASC) 5 MG tablet Take 1 tablet (5 mg total) by mouth daily. 01/01/22   Standley Brooking, MD  atorvastatin (LIPITOR) 20 MG tablet Take 1 tablet (20 mg total) by mouth daily. 12/27/21   Bensimhon, Bevelyn Buckles, MD  carvedilol (COREG) 12.5 MG tablet Take 1 tablet (12.5 mg total) by mouth 2 (two) times daily with a meal. 12/27/21   Bensimhon, Bevelyn Buckles, MD  dapagliflozin propanediol (FARXIGA) 10 MG TABS tablet Take 1 tablet (10 mg total) by mouth daily. 12/27/21   Bensimhon, Bevelyn Buckles, MD  furosemide (LASIX) 20 MG tablet Take 1 tablet (20 mg total) by mouth daily. 12/27/21   Bensimhon, Bevelyn Buckles, MD  hydrALAZINE (APRESOLINE) 100 MG tablet Take 1 tablet (100 mg total) by mouth 3 (three) times daily. 12/27/21   Bensimhon, Bevelyn Buckles, MD  isosorbide mononitrate (IMDUR) 30 MG 24 hr tablet Take 2 tablets (60 mg total) by  mouth daily. 12/27/21   Bensimhon, Bevelyn Buckles, MD      Allergies    Patient has no known allergies.    Review of Systems   Review of Systems  All other systems reviewed and are negative.   Physical Exam Updated Vital Signs BP (!) 198/115   Pulse 88   Temp 97.8 F (36.6 C) (Oral)   Resp (!) 22   Ht 5\' 7"  (1.702 m)   Wt 84.8 kg   LMP 12/31/2014   SpO2 100%   BMI 29.29 kg/m  Physical Exam Vitals and nursing note reviewed.  Constitutional:      General: She is not in acute distress.    Appearance: Normal appearance.  HENT:     Head: Normocephalic and atraumatic.  Eyes:     General:        Right eye: No discharge.        Left eye: No discharge.  Cardiovascular:     Rate and Rhythm: Normal rate and regular rhythm.     Heart sounds: No murmur heard.    No friction rub. No gallop.  Pulmonary:     Effort: Pulmonary effort is normal.     Breath sounds: Normal breath sounds.  Abdominal:     General: Bowel sounds are normal.     Palpations: Abdomen is soft.  Skin:    General: Skin is warm and dry.     Capillary Refill: Capillary refill takes  less than 2 seconds.  Neurological:     Mental Status: She is alert and oriented to person, place, and time.     Comments: Cranial nerves II through XII grossly intact.  Intact finger-nose, intact heel-to-shin.  Romberg negative, gait normal.  Alert and oriented x3.  Moves all 4 limbs spontaneously, normal coordination.  No pronator drift.  Intact strength 5 out of 5 bilateral upper and lower extremities.  Patient endorses subjective sensory discrepancy on the left side of face, left arm, left leg.  She also endorses blurry versus double vision with left eye alone.  But has no visual field deficits on my exam.  Normal extraocular movements, normal conjugate gaze.  Psychiatric:        Mood and Affect: Mood normal.        Behavior: Behavior normal.     ED Results / Procedures / Treatments   Labs (all labs ordered are listed, but only  abnormal results are displayed) Labs Reviewed  COMPREHENSIVE METABOLIC PANEL - Abnormal; Notable for the following components:      Result Value   Glucose, Bld 127 (*)    Creatinine, Ser 1.26 (*)    GFR, Estimated 51 (*)    All other components within normal limits  I-STAT CHEM 8, ED - Abnormal; Notable for the following components:   BUN 21 (*)    Creatinine, Ser 1.40 (*)    Glucose, Bld 120 (*)    All other components within normal limits  CBG MONITORING, ED - Abnormal; Notable for the following components:   Glucose-Capillary 130 (*)    All other components within normal limits  TROPONIN I (HIGH SENSITIVITY) - Abnormal; Notable for the following components:   Troponin I (High Sensitivity) 28 (*)    All other components within normal limits  PROTIME-INR  APTT  CBC  DIFFERENTIAL  ETHANOL  RAPID URINE DRUG SCREEN, HOSP PERFORMED  TROPONIN I (HIGH SENSITIVITY)    EKG EKG Interpretation Date/Time:  Wednesday January 11 2023 19:21:04 EDT Ventricular Rate:  90 PR Interval:  184 QRS Duration:  122 QT Interval:  390 QTC Calculation: 477 R Axis:   -74  Text Interpretation: Normal sinus rhythm Biatrial enlargement Left anterior fascicular block Left ventricular hypertrophy with QRS widening ( Cornell product ) Abnormal ECG When compared with ECG of 29-Dec-2021 14:36, Since last tracing rate faster Confirmed by Linwood Dibbles [phone]) on 01/11/2023 9:57:50 PM  Radiology MR BRAIN WO CONTRAST  Result Date: 01/11/2023 CLINICAL DATA:  Acute neurologic deficit EXAM: MRI HEAD WITHOUT CONTRAST TECHNIQUE: Multiplanar, multiecho pulse sequences of the brain and surrounding structures were obtained without intravenous contrast. COMPARISON:  01/02/2019 FINDINGS: Brain: No acute infarct, mass effect or extra-axial collection. No acute or chronic hemorrhage. There is multifocal hyperintense T2-weighted signal within the white matter. Parenchymal volume and CSF spaces are normal. Old right occipital  infarct. The midline structures are normal. Vascular: Major flow voids are preserved. Skull and upper cervical spine: Normal calvarium and skull base. Visualized upper cervical spine and soft tissues are normal. Sinuses/Orbits:No paranasal sinus fluid levels or advanced mucosal thickening. No mastoid or middle ear effusion. Normal orbits. IMPRESSION: 1. No acute intracranial abnormality. 2. Old right occipital infarct. 3. Chronic small vessel ischemia. Electronically Signed   By: Deatra Robinson M.D.   On: 01/11/2023 22:42   CT HEAD WO CONTRAST  Result Date: 01/11/2023 CLINICAL DATA:  Left-sided numbness for 2 days, initial encounter EXAM: CT HEAD WITHOUT CONTRAST TECHNIQUE: Contiguous axial images were obtained  from the base of the skull through the vertex without intravenous contrast. RADIATION DOSE REDUCTION: This exam was performed according to the departmental dose-optimization program which includes automated exposure control, adjustment of the mA and/or kV according to patient size and/or use of iterative reconstruction technique. COMPARISON:  01/02/2019 FINDINGS: Brain: Old right occipital infarct is noted. Lacunar infarcts are seen within the left head of the caudate nucleus and thalami bilaterally. No acute hemorrhage, acute infarction or space-occupying mass lesion is noted. Vascular: No hyperdense vessel or unexpected calcification. Skull: Normal. Negative for fracture or focal lesion. Sinuses/Orbits: No acute finding. Other: None. IMPRESSION: Chronic changes without acute abnormality. The overall appearance is stable from the prior study. Electronically Signed   By: Alcide Clever M.D.   On: 01/11/2023 21:01    Procedures .Critical Care  Performed by: Olene Floss, PA-C Authorized by: Olene Floss, PA-C   Critical care provider statement:    Critical care time (minutes):  35   Critical care was necessary to treat or prevent imminent or life-threatening deterioration of the  following conditions:  Cardiac failure (hypertensive emergency)   Critical care was time spent personally by me on the following activities:  Development of treatment plan with patient or surrogate, discussions with consultants, evaluation of patient's response to treatment, examination of patient, ordering and review of laboratory studies, ordering and review of radiographic studies, ordering and performing treatments and interventions, pulse oximetry, re-evaluation of patient's condition and review of old charts   Care discussed with: admitting provider       Medications Ordered in ED Medications  sodium chloride flush (NS) 0.9 % injection 3 mL (3 mLs Intravenous Not Given 01/11/23 2022)  morphine (PF) 4 MG/ML injection 4 mg (4 mg Intravenous Given 01/11/23 2134)  labetalol (NORMODYNE) injection 10 mg (10 mg Intravenous Given 01/11/23 2241)  hydrALAZINE (APRESOLINE) injection 10 mg (10 mg Intravenous Given 01/11/23 2307)  furosemide (LASIX) injection 40 mg (40 mg Intravenous Given 01/11/23 2349)  hydrALAZINE (APRESOLINE) tablet 100 mg (100 mg Oral Given 01/11/23 2350)  amLODipine (NORVASC) tablet 5 mg (5 mg Oral Given 01/11/23 2350)  morphine (PF) 4 MG/ML injection 4 mg (4 mg Intravenous Given 01/11/23 2359)    ED Course/ Medical Decision Making/ A&P                             Medical Decision Making Amount and/or Complexity of Data Reviewed Labs: ordered. Radiology: ordered.  Risk Prescription drug management.   This patient is a 52 y.o. female who presents to the ED for concern of hypertension, headache, numbness, weakness, this involves an extensive number of treatment options, and is a complaint that carries with it a high risk of complications and morbidity. The emergent differential diagnosis prior to evaluation includes, but is not limited to,  Stroke, increased ICP, meningitis, CVA, intracranial tumor, venous sinus thrombosis, migraine, cluster headache, hypertension, drug related, head  injury, tension headache, sinusitis, dental abscess, otitis media, TMJ, given patient's blood pressure on arrival considered hypertensive urgency, emergency, and related sequela. This is not an exhaustive differential.   Past Medical History / Co-morbidities / Social History: hypertension, CKD, NSTEMI, alcohol, substance abuse, although she denies any current use, tobacco abuse  Additional history: Chart reviewed. Pertinent results include: Reviewed extensive outpatient cardiology visits, patient without any recent cardiology visits, although reports that she is currently taking her medications as prescribed.  Physical Exam: Physical exam performed. The pertinent findings  include: Patient with no objective neurologic deficits on my exam but she does endorse some sensory discrepancy on the left face, arm, leg  Lab Tests: I ordered, and personally interpreted labs.  The pertinent results include: Initial troponin elevated at 28.  CMP with overall stable lab values, creatinine mildly elevated 1.26 but no true AKI from baseline.  CBC unremarkable. Pending delta troponin at this time but anticipate unlikely to continue to elevated -- has been mildly elevated in past and with no chest pain since prior to ED arrival.   Imaging Studies: I ordered imaging studies including CT head without contrast, MR brain without contrast. I independently visualized and interpreted imaging which showed old right occipital infarct, no evidence of acute stroke at this time. I agree with the radiologist interpretation.   Cardiac Monitoring:  The patient was maintained on a cardiac monitor.  My attending physician Dr. Lynelle Doctor viewed and interpreted the cardiac monitored which showed an underlying rhythm of: NSR. I agree with this interpretation.   Medications: I ordered medication including hydralazine, labetalol, morphine for pain, elevated blood pressure.  Patient with improvement of pain after morphine, her blood pressure  briefly improved with 196/125, but most recently at 204/119.  Consultations Obtained: I requested consultation with the CC MD, spoke with [facility] who requested oral antihypertensives and admission to medicine, spoke with Dr. Loney Loh,  and discussed lab and imaging findings as well as pertinent plan - they recommend: Dr. Loney Loh would like repeat troponin, and tighter control of her blood pressure before she would except admission to the floor. Pending repeat troponin and recheck BP she will be admitted to medicine service. After lasix, oral antihypertensives, patient with improvement of BP, systolic now less than 200. Dr. Loney Loh agrees to admission at this time.   Disposition: After consideration of the diagnostic results and the patients response to treatment, I feel that patient would benefit from admission .   I discussed this case with my attending physician Dr. Lynelle Doctor who cosigned this note including patient's presenting symptoms, physical exam, and planned diagnostics and interventions. Attending physician stated agreement with plan or made changes to plan which were implemented.    Final Clinical Impression(s) / ED Diagnoses Final diagnoses:  Hypertensive emergency    Rx / DC Orders ED Discharge Orders     None         Olene Floss, PA-C 01/12/23 0001    Olene Floss, PA-C 01/12/23 0006    Linwood Dibbles, MD 01/12/23 1500

## 2023-01-12 ENCOUNTER — Observation Stay (HOSPITAL_COMMUNITY): Payer: BLUE CROSS/BLUE SHIELD

## 2023-01-12 DIAGNOSIS — R079 Chest pain, unspecified: Secondary | ICD-10-CM | POA: Diagnosis not present

## 2023-01-12 DIAGNOSIS — R200 Anesthesia of skin: Secondary | ICD-10-CM | POA: Diagnosis not present

## 2023-01-12 DIAGNOSIS — I5022 Chronic systolic (congestive) heart failure: Secondary | ICD-10-CM | POA: Diagnosis not present

## 2023-01-12 DIAGNOSIS — E7849 Other hyperlipidemia: Secondary | ICD-10-CM

## 2023-01-12 DIAGNOSIS — I160 Hypertensive urgency: Secondary | ICD-10-CM

## 2023-01-12 DIAGNOSIS — M79605 Pain in left leg: Secondary | ICD-10-CM

## 2023-01-12 LAB — RAPID URINE DRUG SCREEN, HOSP PERFORMED
Amphetamines: NOT DETECTED
Barbiturates: NOT DETECTED
Benzodiazepines: NOT DETECTED
Cocaine: NOT DETECTED
Opiates: POSITIVE — AB
Tetrahydrocannabinol: POSITIVE — AB

## 2023-01-12 LAB — RENAL FUNCTION PANEL
Albumin: 4.4 g/dL (ref 3.5–5.0)
Anion gap: 10 (ref 5–15)
BUN: 17 mg/dL (ref 6–20)
CO2: 25 mmol/L (ref 22–32)
Calcium: 9.5 mg/dL (ref 8.9–10.3)
Chloride: 100 mmol/L (ref 98–111)
Creatinine, Ser: 1.16 mg/dL — ABNORMAL HIGH (ref 0.44–1.00)
GFR, Estimated: 56 mL/min — ABNORMAL LOW (ref >60)
Glucose, Bld: 139 mg/dL — ABNORMAL HIGH (ref 70–99)
Phosphorus: 3.5 mg/dL (ref 2.5–4.6)
Potassium: 3.5 mmol/L (ref 3.5–5.1)
Sodium: 135 mmol/L (ref 135–145)

## 2023-01-12 LAB — VITAMIN B12: Vitamin B-12: 397 pg/mL (ref 180–914)

## 2023-01-12 LAB — CK: Total CK: 110 U/L (ref 38–234)

## 2023-01-12 LAB — TROPONIN I (HIGH SENSITIVITY): Troponin I (High Sensitivity): 28 ng/L — ABNORMAL HIGH (ref <18)

## 2023-01-12 LAB — HIV ANTIBODY (ROUTINE TESTING W REFLEX): HIV Screen 4th Generation wRfx: NONREACTIVE

## 2023-01-12 LAB — MAGNESIUM: Magnesium: 2.1 mg/dL (ref 1.7–2.4)

## 2023-01-12 MED ORDER — ACETAMINOPHEN 650 MG RE SUPP: 650.0000 mg | Freq: Four times a day (QID) | RECTAL | Status: DC | PRN

## 2023-01-12 MED ORDER — FUROSEMIDE 20 MG PO TABS
20.0000 mg | ORAL_TABLET | Freq: Every day | ORAL | Status: DC
  Administered 2023-01-12: 20 mg via ORAL
  Filled 2023-01-12: qty 1

## 2023-01-12 MED ORDER — ISOSORBIDE MONONITRATE ER 60 MG PO TB24
60.0000 mg | ORAL_TABLET | Freq: Every day | ORAL | Status: DC
  Administered 2023-01-12: 60 mg via ORAL
  Filled 2023-01-12: qty 1

## 2023-01-12 MED ORDER — MORPHINE SULFATE (PF) 2 MG/ML IV SOLN
2.0000 mg | INTRAVENOUS | Status: DC | PRN
  Administered 2023-01-12: 4 mg via INTRAVENOUS
  Filled 2023-01-12: qty 2

## 2023-01-12 MED ORDER — PNEUMOCOCCAL 20-VAL CONJ VACC 0.5 ML IM SUSY: 0.5000 mL | PREFILLED_SYRINGE | INTRAMUSCULAR | Status: DC

## 2023-01-12 MED ORDER — HYDRALAZINE HCL 50 MG PO TABS
100.0000 mg | ORAL_TABLET | Freq: Three times a day (TID) | ORAL | Status: DC
  Administered 2023-01-12: 100 mg via ORAL
  Filled 2023-01-12: qty 2

## 2023-01-12 MED ORDER — ENOXAPARIN SODIUM 40 MG/0.4ML IJ SOSY: 40.0000 mg | PREFILLED_SYRINGE | INTRAMUSCULAR | Status: DC

## 2023-01-12 MED ORDER — AMLODIPINE BESYLATE 5 MG PO TABS
5.0000 mg | ORAL_TABLET | Freq: Every day | ORAL | Status: DC
  Administered 2023-01-12: 5 mg via ORAL
  Filled 2023-01-12: qty 1

## 2023-01-12 MED ORDER — ATORVASTATIN CALCIUM 10 MG PO TABS
20.0000 mg | ORAL_TABLET | Freq: Every day | ORAL | Status: DC
  Administered 2023-01-12: 20 mg via ORAL
  Filled 2023-01-12: qty 2

## 2023-01-12 MED ORDER — GABAPENTIN 300 MG PO CAPS
300.0000 mg | ORAL_CAPSULE | Freq: Two times a day (BID) | ORAL | 1 refills | Status: DC
  Filled 2023-01-12: qty 60, 30d supply, fill #0
  Filled 2023-02-10: qty 60, 30d supply, fill #1

## 2023-01-12 MED ORDER — VITAMIN B-12 1000 MCG PO TABS
1000.0000 ug | ORAL_TABLET | Freq: Every day | ORAL | 0 refills | Status: AC
  Filled 2023-01-12: qty 30, 30d supply, fill #0

## 2023-01-12 MED ORDER — HYDRALAZINE HCL 20 MG/ML IJ SOLN
10.0000 mg | INTRAMUSCULAR | Status: DC | PRN
  Administered 2023-01-12: 10 mg via INTRAVENOUS
  Filled 2023-01-12 (×2): qty 1

## 2023-01-12 MED ORDER — CARVEDILOL 12.5 MG PO TABS
12.5000 mg | ORAL_TABLET | Freq: Two times a day (BID) | ORAL | Status: DC
  Administered 2023-01-12: 12.5 mg via ORAL
  Filled 2023-01-12: qty 1

## 2023-01-12 MED ORDER — DAPAGLIFLOZIN PROPANEDIOL 10 MG PO TABS
10.0000 mg | ORAL_TABLET | Freq: Every day | ORAL | Status: DC
  Administered 2023-01-12: 10 mg via ORAL
  Filled 2023-01-12: qty 1

## 2023-01-12 MED ORDER — METHYLPREDNISOLONE SODIUM SUCC 125 MG IJ SOLR
125.0000 mg | Freq: Once | INTRAMUSCULAR | Status: AC
  Administered 2023-01-12: 125 mg via INTRAVENOUS
  Filled 2023-01-12: qty 2

## 2023-01-12 MED ORDER — GABAPENTIN 300 MG PO CAPS
300.0000 mg | ORAL_CAPSULE | Freq: Two times a day (BID) | ORAL | Status: DC
  Administered 2023-01-12: 300 mg via ORAL
  Filled 2023-01-12: qty 1

## 2023-01-12 MED ORDER — PREDNISONE 10 MG (21) PO TBPK
ORAL_TABLET | ORAL | 0 refills | Status: DC
  Filled 2023-01-12: qty 21, 6d supply, fill #0

## 2023-01-12 MED ORDER — ONDANSETRON HCL 4 MG/2ML IJ SOLN
4.0000 mg | Freq: Once | INTRAMUSCULAR | Status: AC | PRN
  Administered 2023-01-12: 4 mg via INTRAVENOUS
  Filled 2023-01-12: qty 2

## 2023-01-12 MED ORDER — LABETALOL HCL 5 MG/ML IV SOLN: 10.0000 mg | INTRAVENOUS | Status: DC | PRN

## 2023-01-12 MED ORDER — ACETAMINOPHEN 325 MG PO TABS
650.0000 mg | ORAL_TABLET | Freq: Four times a day (QID) | ORAL | Status: DC | PRN
  Administered 2023-01-12 (×2): 650 mg via ORAL
  Filled 2023-01-12 (×2): qty 2

## 2023-01-12 NOTE — Evaluation (Signed)
Physical Therapy Evaluation Patient Details Name: Emily Key MRN: [id number] DOB: [birth year] Today's Date: 01/12/2023  History of Present Illness  52 y.o. fema;e presents to [facility] hospital on 01/11/2023 with L numbness and LLE pain. Pt found to be hypertensive over 200 systolic. Brain MRI negative. PMH includes: CKD, poorly controlled HTN, NSTEMI, and CHF.  Clinical Impression  Pt presents to PT with reports of LLE sensation deficits and shooting pain. Pt reports pain moves up and down LLE from thigh to lower leg, does not reach foot or ankle. Pt reports some relief in sidelying and increased pain when sitting for long periods. Pt is able to ambulate for household distances with increased time, no significant balance deviations noted. PT provides exercises to increase lumbar lordosis and to improve posture. PT recommends further outpatient PT in an effort to further reduce pain.       Assistance Recommended at Discharge PRN  If plan is discharge home, recommend the following:  Can travel by private vehicle           Equipment Recommendations None recommended by PT  Recommendations for Other Services       Functional Status Assessment Patient has had a recent decline in their functional status and demonstrates the ability to make significant improvements in function in a reasonable and predictable amount of time.     Precautions / Restrictions Precautions Precautions: None Restrictions Weight Bearing Restrictions: No      Mobility  Bed Mobility Overal bed mobility: Independent                  Transfers Overall transfer level: Modified independent                      Ambulation/Gait Ambulation/Gait assistance: Modified independent (Device/Increase time) Gait Distance (Feet): 150 Feet Assistive device: None Gait Pattern/deviations: Step-through pattern Gait velocity: reduced Gait velocity interpretation: <1.8 ft/sec, indicate of risk for recurrent falls    General Gait Details: slowed step-through gait, reduced stance time on LLE  Stairs            Wheelchair Mobility     Tilt Bed    Modified Rankin (Stroke Patients Only)       Balance Overall balance assessment: Needs assistance Sitting-balance support: No upper extremity supported, Feet supported Sitting balance-Leahy Scale: Good     Standing balance support: No upper extremity supported, During functional activity Standing balance-Leahy Scale: Good                               Pertinent Vitals/Pain Pain Assessment Pain Assessment: 0-10 Pain Score: 8  Pain Location: LLE Pain Descriptors / Indicators: Sore Pain Intervention(s): Monitored during session    Home Living Family/patient expects to be discharged to:: Private residence Living Arrangements: Other relatives Available Help at Discharge: Family;Available 24 hours/day Type of Home: House Home Access: Level entry       Home Layout: One level Home Equipment: None      Prior Function Prior Level of Function : Independent/Modified Independent;Driving                     Hand Dominance        Extremity/Trunk Assessment   Upper Extremity Assessment Upper Extremity Assessment: Overall WFL for tasks assessed    Lower Extremity Assessment Lower Extremity Assessment: LLE deficits/detail LLE Deficits / Details: pt reports radiating pain from hip down to anterior  lower leg LLE Sensation: decreased light touch    Cervical / Trunk Assessment Cervical / Trunk Assessment: Normal  Communication   Communication: No difficulties  Cognition Arousal/Alertness: Awake/alert Behavior During Therapy: WFL for tasks assessed/performed Overall Cognitive Status: Within Functional Limits for tasks assessed                                          General Comments General comments (skin integrity, edema, etc.): VSS on RA    Exercises Other Exercises Other Exercises:  repeated lumbar extension in standing, 10 reps Other Exercises: anterior pelvic tilts 10 reps Other Exercises: instruction on use for lumbar roll when seated or driving   Assessment/Plan    PT Assessment Patient needs continued PT services  PT Problem List Decreased activity tolerance;Pain;Impaired sensation       PT Treatment Interventions Gait training;Balance training;Neuromuscular re-education;Patient/family education;Therapeutic exercise    PT Goals (Current goals can be found in the Care Plan section)  Acute Rehab PT Goals Patient Stated Goal: to reduce LLE pain PT Goal Formulation: With patient Time For Goal Achievement: 01/26/23 Potential to Achieve Goals: Fair Additional Goals Additional Goal #1: Pt will score >19/24 on the DGI to indicate a reduced risk for falls    Frequency Min 2X/week     Co-evaluation               AM-PAC PT "6 Clicks" Mobility  Outcome Measure Help needed turning from your back to your side while in a flat bed without using bedrails?: None Help needed moving from lying on your back to sitting on the side of a flat bed without using bedrails?: None Help needed moving to and from a bed to a chair (including a wheelchair)?: None Help needed standing up from a chair using your arms (e.g., wheelchair or bedside chair)?: None Help needed to walk in hospital room?: None Help needed climbing 3-5 steps with a railing? : None 6 Click Score: 24    End of Session   Activity Tolerance: Patient tolerated treatment well Patient left: in bed;with call bell/phone within reach Nurse Communication: Mobility status PT Visit Diagnosis: Other abnormalities of gait and mobility (R26.89);Pain;Other symptoms and signs involving the nervous system (R29.898) Pain - Right/Left: Left Pain - part of body: Leg    Time: 2956-2130 PT Time Calculation (min) (ACUTE ONLY): 32 min   Charges:   PT Evaluation $PT Eval Low Complexity: 1 Low   PT General  Charges $$ ACUTE PT VISIT: 1 Visit         Arlyss Gandy, PT, DPT Acute Rehabilitation Office [phone]   Arlyss Gandy 01/12/2023, 1:14 PM

## 2023-01-12 NOTE — ED Notes (Signed)
Patient stating she is nauseous and about to start vomiting. Blue bag given and admitting MD notified. Awaiting orders.

## 2023-01-12 NOTE — Discharge Summary (Signed)
Physician Discharge Summary  Angalee Providence XBJ:[id number] DOB: 1970/07/27 DOA: 01/11/2023  PCP: Barbette Merino, NP  Admit date: 01/11/2023 Discharge date: 01/12/2023 Admitted From: Home Disposition: Home Recommendations for Outpatient Follow-up:  Follow up with PCP in 1 week Check BP, fluid status, CMP and CBC at follow-up Encourage compliance with medication Please follow up on the following pending results: None  Home Health: Ambulatory referral to orthopedic rehab Equipment/Devices: Not indicated  Discharge Condition: Stable CODE STATUS: Full code  Follow-up Information     Barbette Merino, NP. Schedule an appointment as soon as possible for a visit in 1 week(s).   Specialty: Adult Health Nurse Practitioner Contact information: [address] [place] [address] [phone]         [facility] at [facility] Follow up.   Specialty: Rehabilitation Why: Please call and schedule your appointment Contact information: [address] [id number] mc [place] [address] [phone]                Hospital course 52 year old F with PMH of HFrEF with recovered EF, NICM, pulmonary hypertension, hypertension, seizure disorder and noncompliance with medication presenting with left-sided numbness, left leg pain and associated chest pain with diaphoresis, nausea and vomiting, and admitted for hypertensive urgency.  BP elevated to 233/124.  Patient was not compliant with cardiac medications.  CT head and MRI brain without acute finding.  EKG without acute ischemic finding.  Troponin mildly elevated to 28 and remained flat at 28.  UDS positive for opiate and marijuana.  Patient received IV labetalol, IV Lasix and home antihypertensive meds, and admitted for overnight observation.  The next day, chest pain and GI symptoms resolved.  Blood pressure improved.  Continues to endorse left-sided weakness and numbness  mainly in her leg.  MRI lumbar spine obtained and showed broad-based disc bulges with small central disc protrusions and mild to moderate bilateral foraminal stenosis from L3 down to S1.  CK and B12 within normal.  Patient was given IV Solu-Medrol, and resume p.o. prednisone, gabapentin and vitamin B12.  She was evaluated by therapy.  Ambulatory referral to orthopedic rehab ordered.  Patient was counseled on the importance of compliance with her medication.  She says she has medications at home and did not need refills.  Outpatient follow-up with PCP in 1 to 2 weeks.  Also recommend outpatient follow-up with cardiology.  See individual problem list below for more.   Problems addressed during this hospitalization Principal Problem:   Hypertensive urgency Active Problems:   Chest pain   Chronic systolic CHF (congestive heart failure) (HCC)   Hyperlipidemia   Left sided numbness   Left leg pain     Time spent 35 minutes  Vital signs Vitals:   01/12/23 0744 01/12/23 0844 01/12/23 1031 01/12/23 1119  BP: (!) 186/88 (!) 181/109 (!) 185/100 134/67  Pulse: 64 64  70  Temp: (!) 97.5 F (36.4 C) 98 F (36.7 C)  (!) 97.4 F (36.3 C)  Resp: 16 20  (!) 23  Height:      Weight:      SpO2: 97%   99%  TempSrc: Oral   Oral  BMI (Calculated):         Discharge exam  GENERAL: No apparent distress.  Nontoxic. HEENT: MMM.  Vision and hearing grossly intact.  NECK: Supple.  No apparent JVD.  RESP:  No IWOB.  Fair aeration bilaterally. CVS:  RRR. Heart sounds normal.  ABD/GI/GU: BS+.  Abd soft, NTND.  MSK/EXT:  Moves extremities. No apparent deformity. No edema.  SKIN: no apparent skin lesion or wound NEURO: Awake and alert. Oriented appropriately.  Subtle left leg weakness and left leg paresthesia PSYCH: Calm. Normal affect.   Discharge Instructions Discharge Instructions     Diet - low sodium heart healthy   Complete by: As directed    Discharge instructions   Complete by: As  directed    It has been a pleasure taking care of you!  You were hospitalized due to markedly elevated blood pressure likely from not taking your medications.  Your blood pressure improved after starting the medications.  It is very important that you take your medications as prescribed.  Follow-up with your primary care doctor in 1 to 2 weeks or sooner if needed.  Follow-up with your cardiologist as previously planned.   Take care,   Discharge instructions   Complete by: As directed    It has been a pleasure taking care of you!  You were hospitalized due to very high blood pressure and left leg numbness.  Your MRI did not show stroke.   Your blood pressure improved with medication.  Is very important that you take your medications as prescribed.  In regards to your left leg weakness and numbness, MRI of our back showed  arthritis with some nerve impingement.. We have started you on a steroid, vitamin B12 and gabapentin.  Take these medications as prescribed.  Follow-up with your primary care doctor in 1 to 2 weeks or sooner if needed.   Increase activity slowly   Complete by: As directed    Increase activity slowly   Complete by: As directed       Allergies as of 01/12/2023   No Known Allergies      Medication List     TAKE these medications    amLODipine 5 MG tablet Commonly known as: NORVASC Take 1 tablet (5 mg total) by mouth daily.   atorvastatin 20 MG tablet Commonly known as: LIPITOR Take 1 tablet (20 mg total) by mouth daily.   carvedilol 12.5 MG tablet Commonly known as: COREG Take 1 tablet (12.5 mg total) by mouth 2 (two) times daily with a meal.   cyanocobalamin 1000 MCG tablet Commonly known as: VITAMIN B12 Take 1 tablet (1,000 mcg total) by mouth daily.   Farxiga 10 MG Tabs tablet Generic drug: dapagliflozin propanediol Take 1 tablet (10 mg total) by mouth daily.   furosemide 20 MG tablet Commonly known as: LASIX Take 1 tablet (20 mg total) by mouth  daily.   gabapentin 300 MG capsule Commonly known as: Neurontin Take 1 capsule (300 mg total) by mouth 2 (two) times daily.   hydrALAZINE 100 MG tablet Commonly known as: APRESOLINE Take 1 tablet (100 mg total) by mouth 3 (three) times daily.   isosorbide mononitrate 30 MG 24 hr tablet Commonly known as: IMDUR Take 2 tablets (60 mg total) by mouth daily.   predniSONE 10 MG (21) Tbpk tablet Commonly known as: STERAPRED UNI-PAK 21 TAB Per dose pack. Take with breakfast Start taking on: January 13, 2023        Consultations: None  Procedures/Studies:   MR LUMBAR SPINE WO CONTRAST  Result Date: 01/12/2023 CLINICAL DATA:  Acute lumbar myelopathy.  Radiculopathy. EXAM: MRI LUMBAR SPINE WITHOUT CONTRAST TECHNIQUE: Multiplanar, multisequence MR imaging of the lumbar spine was performed. No intravenous contrast was administered. COMPARISON:  None Available. FINDINGS: Segmentation:  Standard. Alignment:  Physiologic. Vertebrae:  No acute fracture, evidence of discitis, or aggressive bone lesion. Conus medullaris and cauda equina: Conus extends to the L2-3 level. Conus and cauda equina appear normal. Paraspinal and other soft tissues: No acute paraspinal abnormality. Disc levels: Disc spaces: Disc desiccation at L3-4 and L5-S1. T12-L1: Small shallow left paracentral disc protrusion. No foraminal or central canal stenosis. L1-L2: No significant disc bulge. Mild bilateral facet arthropathy. No foraminal or central canal stenosis. L2-L3: No significant disc bulge. Mild bilateral facet arthropathy. No foraminal or central canal stenosis. L3-L4: Broad-based disc bulge with a central/right paracentral broad shallow disc protrusion. Mild bilateral foraminal stenosis. Mild bilateral lateral recess stenosis. No spinal stenosis. L4-L5: Mild broad-based disc bulge. Mild bilateral facet arthropathy. Moderate bilateral foraminal stenosis. No spinal stenosis. L5-S1: Mild broad-based disc bulge with a small central  disc protrusion. Moderate right and mild left foraminal stenosis. No spinal stenosis. IMPRESSION: 1. At L3-4 there is a broad-based disc bulge with a central/right paracentral broad shallow disc protrusion. Mild bilateral foraminal stenosis. Mild bilateral lateral recess stenosis. 2. At L4-5 there is a mild broad-based disc bulge. Mild bilateral facet arthropathy. Moderate bilateral foraminal stenosis. 3. At L5-S1 there is a mild broad-based disc bulge with a small central disc protrusion. Moderate right and mild left foraminal stenosis. 4. No acute osseous injury of the lumbar spine. Electronically Signed   By: Elige Ko M.D.   On: 01/12/2023 10:19   DG Chest Portable 1 View  Result Date: 01/12/2023 CLINICAL DATA:  Chest pain and high blood pressure EXAM: PORTABLE CHEST 1 VIEW COMPARISON:  12/29/2021 FINDINGS: Stable cardiomediastinal silhouette. Left basilar atelectasis or scarring. No focal consolidation, pleural effusion, or pneumothorax. No displaced rib fractures. IMPRESSION: No active disease. Electronically Signed   By: Minerva Fester M.D.   On: 01/12/2023 00:28   MR BRAIN WO CONTRAST  Result Date: 01/11/2023 CLINICAL DATA:  Acute neurologic deficit EXAM: MRI HEAD WITHOUT CONTRAST TECHNIQUE: Multiplanar, multiecho pulse sequences of the brain and surrounding structures were obtained without intravenous contrast. COMPARISON:  01/02/2019 FINDINGS: Brain: No acute infarct, mass effect or extra-axial collection. No acute or chronic hemorrhage. There is multifocal hyperintense T2-weighted signal within the white matter. Parenchymal volume and CSF spaces are normal. Old right occipital infarct. The midline structures are normal. Vascular: Major flow voids are preserved. Skull and upper cervical spine: Normal calvarium and skull base. Visualized upper cervical spine and soft tissues are normal. Sinuses/Orbits:No paranasal sinus fluid levels or advanced mucosal thickening. No mastoid or middle ear  effusion. Normal orbits. IMPRESSION: 1. No acute intracranial abnormality. 2. Old right occipital infarct. 3. Chronic small vessel ischemia. Electronically Signed   By: Deatra Robinson M.D.   On: 01/11/2023 22:42   CT HEAD WO CONTRAST  Result Date: 01/11/2023 CLINICAL DATA:  Left-sided numbness for 2 days, initial encounter EXAM: CT HEAD WITHOUT CONTRAST TECHNIQUE: Contiguous axial images were obtained from the base of the skull through the vertex without intravenous contrast. RADIATION DOSE REDUCTION: This exam was performed according to the departmental dose-optimization program which includes automated exposure control, adjustment of the mA and/or kV according to patient size and/or use of iterative reconstruction technique. COMPARISON:  01/02/2019 FINDINGS: Brain: Old right occipital infarct is noted. Lacunar infarcts are seen within the left head of the caudate nucleus and thalami bilaterally. No acute hemorrhage, acute infarction or space-occupying mass lesion is noted. Vascular: No hyperdense vessel or unexpected calcification. Skull: Normal. Negative for fracture or focal lesion. Sinuses/Orbits: No acute finding. Other: None. IMPRESSION: Chronic changes  without acute abnormality. The overall appearance is stable from the prior study. Electronically Signed   By: Alcide Clever M.D.   On: 01/11/2023 21:01       The results of significant diagnostics from this hospitalization (including imaging, microbiology, ancillary and laboratory) are listed below for reference.     Microbiology: No results found for this or any previous visit (from the past 240 hour(s)).   Labs:  CBC: Recent Labs  Lab 01/11/23 1935 01/11/23 2011  WBC 8.0  --   NEUTROABS 4.2  --   HGB 13.7 14.6  HCT 42.8 43.0  MCV 84.8  --   PLT 247  --    BMP &GFR Recent Labs  Lab 01/11/23 1935 01/11/23 2011 01/12/23 0800  NA 138 140 135  K 3.9 3.8 3.5  CL 103 103 100  CO2 25  --  25  GLUCOSE 127* 120* 139*  BUN 17 21* 17   CREATININE 1.26* 1.40* 1.16*  CALCIUM 9.4  --  9.5  MG  --   --  2.1  PHOS  --   --  3.5   Estimated Creatinine Clearance: 62.8 mL/min (A) (by C-G formula based on SCr of 1.16 mg/dL (H)). Liver & Pancreas: Recent Labs  Lab 01/11/23 1935 01/12/23 0800  AST 22  --   ALT 12  --   ALKPHOS 56  --   BILITOT 0.6  --   PROT 7.9  --   ALBUMIN 4.0 4.4   No results for input(s): "LIPASE", "AMYLASE" in the last 168 hours. No results for input(s): "AMMONIA" in the last 168 hours. Diabetic: No results for input(s): "HGBA1C" in the last 72 hours. Recent Labs  Lab 01/11/23 1937  GLUCAP 130*   Cardiac Enzymes: Recent Labs  Lab 01/12/23 0948  CKTOTAL 110   No results for input(s): "PROBNP" in the last 8760 hours. Coagulation Profile: Recent Labs  Lab 01/11/23 1935  INR 1.0   Thyroid Function Tests: No results for input(s): "TSH", "T4TOTAL", "FREET4", "T3FREE", "THYROIDAB" in the last 72 hours. Lipid Profile: No results for input(s): "CHOL", "HDL", "LDLCALC", "TRIG", "CHOLHDL", "LDLDIRECT" in the last 72 hours. Anemia Panel: Recent Labs    01/12/23 0534  VITAMINB12 397   Urine analysis:    Component Value Date/Time   COLORURINE YELLOW 12/30/2021 1108   APPEARANCEUR CLEAR 12/30/2021 1108   LABSPEC 1.013 12/30/2021 1108   PHURINE 6.0 12/30/2021 1108   GLUCOSEU >=500 (A) 12/30/2021 1108   HGBUR NEGATIVE 12/30/2021 1108   BILIRUBINUR NEGATIVE 12/30/2021 1108   BILIRUBINUR neg 02/05/2021 1142   KETONESUR NEGATIVE 12/30/2021 1108   PROTEINUR NEGATIVE 12/30/2021 1108   UROBILINOGEN 0.2 02/05/2021 1142   UROBILINOGEN 0.2 02/09/2015 1242   NITRITE NEGATIVE 12/30/2021 1108   LEUKOCYTESUR NEGATIVE 12/30/2021 1108   Sepsis Labs: Invalid input(s): "PROCALCITONIN", "LACTICIDVEN"   SIGNED:  Almon Hercules, MD  [facility] 01/12/2023, 4:14 PM

## 2023-01-12 NOTE — Progress Notes (Signed)
Pt discharged to home in stable condition. All discharge instructions and medication reviewed with pt. Pt gives complete verbal understanding. Pt transported off unit via wheelchair with staff x1 at chairside to private vehicle for transport home.

## 2023-01-12 NOTE — ED Notes (Signed)
ED TO INPATIENT HANDOFF REPORT  ED Nurse Name and Phone #: Paulino Rily [phone]  S Name/Age/Gender Emily Key 52 y.o. female Room/Bed: 022C/022C  Code Status   Code Status: Prior  Home/SNF/Other Home Patient oriented to: self, place, time, and situation Is this baseline? Yes   Triage Complete: Triage complete  Chief Complaint Hypertensive urgency [I16.0]  Triage Note Pt arrived with left sided numbness that started yesterday. Today woke with with pain and numbness to L leg . Pt has sensory deficit to left side of body; increased pain to Left leg on exam. Reports noncompliance with bp medication at home due to diuretic; has been more compliant within the past week. Pt reports chest pain with diaphoresis that has resolved on arrival   Allergies No Known Allergies  Level of Care/Admitting Diagnosis ED Disposition     ED Disposition  Admit   Condition  --   Comment  Hospital Area: [facility] [[id number]]  Level of Care: Progressive [102]  Admit to Progressive based on following criteria: CARDIOVASCULAR & THORACIC of moderate stability with acute coronary syndrome symptoms/low risk myocardial infarction/hypertensive urgency/arrhythmias/heart failure potentially compromising stability and stable post cardiovascular intervention patients.  Admit to Progressive based on following criteria: NEUROLOGICAL AND NEUROSURGICAL complex patients with significant risk of instability, who do not meet ICU criteria, yet require close observation or frequent assessment (< / = every 2 - 4 hours) with medical / nursing intervention.  May place patient in observation at [facility] or [facility] if equivalent level of care is available:: Yes  Covid Evaluation: Asymptomatic - no recent exposure (last 10 days) testing not required  Diagnosis: Hypertensive urgency [981191]  Admitting Physician: John Giovanni [[id number]]  Attending Physician: John Giovanni [[id number]]           B Medical/Surgery History Past Medical History:  Diagnosis Date   Cerumen impaction 01/2019   CHF (congestive heart failure) (HCC)    after last pregnancy   Daily headache    Heart murmur    "born w/one":   History of hiatal hernia    Hyperlipidemia 01/2019   Hypertension    Hypertensive emergency 01/02/2019   Observed seizure-like activity (HCC) 01/02/2019   Pneumonia 11/2014   Seasonal allergies 01/2019   Vitamin D deficiency 01/2019   Past Surgical History:  Procedure Laterality Date   CARDIAC CATHETERIZATION N/A 02/13/2015   Procedure: Left Heart Cath and Coronary Angiography;  Surgeon: Lennette Bihari, MD;  Location: [facility] INVASIVE CV LAB;  Service: Cardiovascular;  Laterality: N/A;   PLEURAL EFFUSION DRAINAGE Left 11/14/2014   Procedure: DRAINAGE OF LEFT PLEURAL EFFUSION;  Surgeon: Loreli Slot, MD;  Location: [facility] OR;  Service: Thoracic;  Laterality: Left;   RIGHT/LEFT HEART CATH AND CORONARY ANGIOGRAPHY N/A 12/24/2020   Procedure: RIGHT/LEFT HEART CATH AND CORONARY ANGIOGRAPHY;  Surgeon: Dolores Patty, MD;  Location: [facility] INVASIVE CV LAB;  Service: Cardiovascular;  Laterality: N/A;   TONSILLECTOMY AND ADENOIDECTOMY  ~ 1986   TUBAL LIGATION  2000   VIDEO ASSISTED THORACOSCOPY (VATS)/DECORTICATION Left 11/14/2014   Procedure: LEFT VIDEO ASSISTED THORACOSCOPY (VATS)/DECORTICATION;  Surgeon: Loreli Slot, MD;  Location: [facility] OR;  Service: Thoracic;  Laterality: Left;     A IV Location/Drains/Wounds Patient Lines/Drains/Airways Status     Active Line/Drains/Airways     Name Placement date Placement time Site Days   Peripheral IV 01/11/23 20 G Right Antecubital 01/11/23  1938  Antecubital  1  Intake/Output Last 24 hours  Intake/Output Summary (Last 24 hours) at 01/12/2023 0058 Last data filed at 01/12/2023 0029 Gross per 24 hour  Intake --  Output 1000 ml  Net -1000 ml    Labs/Imaging Results for orders placed or performed during the  hospital encounter of 01/11/23 (from the past 48 hour(s))  Protime-INR     Status: None   Collection Time: 01/11/23  7:35 PM  Result Value Ref Range   Prothrombin Time 13.2 11.4 - 15.2 seconds   INR 1.0 0.8 - 1.2    Comment: (NOTE) INR goal varies based on device and disease states. Performed at [facility], [address], [place], [place] [address]   APTT     Status: None   Collection Time: 01/11/23  7:35 PM  Result Value Ref Range   aPTT 31 24 - 36 seconds    Comment: Performed at [facility], [address] [place], [place] [address]  CBC     Status: None   Collection Time: 01/11/23  7:35 PM  Result Value Ref Range   WBC 8.0 4.0 - 10.5 K/uL   RBC 5.05 3.87 - 5.11 MIL/uL   Hemoglobin 13.7 12.0 - 15.0 g/dL   HCT 86.5 78.4 - 69.6 %   MCV 84.8 80.0 - 100.0 fL   MCH 27.1 26.0 - 34.0 pg   MCHC 32.0 30.0 - 36.0 g/dL   RDW 29.5 28.4 - 13.2 %   Platelets 247 150 - 400 K/uL   nRBC 0.0 0.0 - 0.2 %    Comment: Performed at [facility], [address], [place], [place] [address]  Differential     Status: None   Collection Time: 01/11/23  7:35 PM  Result Value Ref Range   Neutrophils Relative % 52 %   Neutro Abs 4.2 1.7 - 7.7 K/uL   Lymphocytes Relative 36 %   Lymphs Abs 2.9 0.7 - 4.0 K/uL   Monocytes Relative 6 %   Monocytes Absolute 0.5 0.1 - 1.0 K/uL   Eosinophils Relative 5 %   Eosinophils Absolute 0.4 0.0 - 0.5 K/uL   Basophils Relative 1 %   Basophils Absolute 0.1 0.0 - 0.1 K/uL   Immature Granulocytes 0 %   Abs Immature Granulocytes 0.01 0.00 - 0.07 K/uL    Comment: Performed at [facility], [address] [place], [place] [address]  Comprehensive metabolic panel     Status: Abnormal   Collection Time: 01/11/23  7:35 PM  Result Value Ref Range   Sodium 138 135 - 145 mmol/L   Potassium 3.9 3.5 - 5.1 mmol/L   Chloride 103 98 - 111 mmol/L   CO2 25 22 - 32 mmol/L   Glucose, Bld 127 (H) 70 - 99 mg/dL    Comment: Glucose reference range  applies only to samples taken after fasting for at least 8 hours.   BUN 17 6 - 20 mg/dL   Creatinine, Ser 6.64 (H) 0.44 - 1.00 mg/dL   Calcium 9.4 8.9 - 40.3 mg/dL   Total Protein 7.9 6.5 - 8.1 g/dL   Albumin 4.0 3.5 - 5.0 g/dL   AST 22 15 - 41 U/L   ALT 12 0 - 44 U/L   Alkaline Phosphatase 56 38 - 126 U/L   Total Bilirubin 0.6 0.3 - 1.2 mg/dL   GFR, Estimated 51 (L) >60 mL/min    Comment: (NOTE) Calculated using the CKD-EPI Creatinine Equation (2021)  Anion gap 10 5 - 15    Comment: Performed at [facility], [address] [place], [place] [address]  Ethanol     Status: None   Collection Time: 01/11/23  7:35 PM  Result Value Ref Range   Alcohol, Ethyl (B) <10 <10 mg/dL    Comment: (NOTE) Lowest detectable limit for serum alcohol is 10 mg/dL.  For medical purposes only. Performed at [facility], [address] [place], [place] [address]   CBG monitoring, ED     Status: Abnormal   Collection Time: 01/11/23  7:37 PM  Result Value Ref Range   Glucose-Capillary 130 (H) 70 - 99 mg/dL    Comment: Glucose reference range applies only to samples taken after fasting for at least 8 hours.  I-stat chem 8, ED     Status: Abnormal   Collection Time: 01/11/23  8:11 PM  Result Value Ref Range   Sodium 140 135 - 145 mmol/L   Potassium 3.8 3.5 - 5.1 mmol/L   Chloride 103 98 - 111 mmol/L   BUN 21 (H) 6 - 20 mg/dL   Creatinine, Ser 0.98 (H) 0.44 - 1.00 mg/dL   Glucose, Bld 119 (H) 70 - 99 mg/dL    Comment: Glucose reference range applies only to samples taken after fasting for at least 8 hours.   Calcium, Ion 1.16 1.15 - 1.40 mmol/L   TCO2 28 22 - 32 mmol/L   Hemoglobin 14.6 12.0 - 15.0 g/dL   HCT 14.7 82.9 - 56.2 %  Troponin I (High Sensitivity)     Status: Abnormal   Collection Time: 01/11/23  9:30 PM  Result Value Ref Range   Troponin I (High Sensitivity) 28 (H) <18 ng/L    Comment: (NOTE) Elevated high sensitivity troponin I (hsTnI) values and significant   changes across serial measurements may suggest ACS but many other  chronic and acute conditions are known to elevate hsTnI results.  Refer to the "Links" section for chest pain algorithms and additional  guidance. Performed at [facility], [address], [place], [place] [address]   Rapid urine drug screen (hospital performed)     Status: Abnormal   Collection Time: 01/11/23 11:45 PM  Result Value Ref Range   Opiates POSITIVE (A) NONE DETECTED   Cocaine NONE DETECTED NONE DETECTED   Benzodiazepines NONE DETECTED NONE DETECTED   Amphetamines NONE DETECTED NONE DETECTED   Tetrahydrocannabinol POSITIVE (A) NONE DETECTED   Barbiturates NONE DETECTED NONE DETECTED    Comment: (NOTE) DRUG SCREEN FOR MEDICAL PURPOSES ONLY.  IF CONFIRMATION IS NEEDED FOR ANY PURPOSE, NOTIFY LAB WITHIN 5 DAYS.  LOWEST DETECTABLE LIMITS FOR URINE DRUG SCREEN Drug Class                     Cutoff (ng/mL) Amphetamine and metabolites    1000 Barbiturate and metabolites    200 Benzodiazepine                 200 Opiates and metabolites        300 Cocaine and metabolites        300 THC                            50 Performed at [facility], [address], [place], [place] [address]   Troponin I (High Sensitivity)     Status: Abnormal   Collection Time: 01/11/23  11:52 PM  Result Value Ref Range   Troponin I (High Sensitivity) 28 (H) <18 ng/L    Comment: (NOTE) Elevated high sensitivity troponin I (hsTnI) values and significant  changes across serial measurements may suggest ACS but many other  chronic and acute conditions are known to elevate hsTnI results.  Refer to the "Links" section for chest pain algorithms and additional  guidance. Performed at [facility], [address] [place], [place] [address]    DG Chest Portable 1 View  Result Date: 01/12/2023 CLINICAL DATA:  Chest pain and high blood pressure EXAM: PORTABLE CHEST 1 VIEW COMPARISON:  12/29/2021 FINDINGS: Stable  cardiomediastinal silhouette. Left basilar atelectasis or scarring. No focal consolidation, pleural effusion, or pneumothorax. No displaced rib fractures. IMPRESSION: No active disease. Electronically Signed   By: Minerva Fester M.D.   On: 01/12/2023 00:28   MR BRAIN WO CONTRAST  Result Date: 01/11/2023 CLINICAL DATA:  Acute neurologic deficit EXAM: MRI HEAD WITHOUT CONTRAST TECHNIQUE: Multiplanar, multiecho pulse sequences of the brain and surrounding structures were obtained without intravenous contrast. COMPARISON:  01/02/2019 FINDINGS: Brain: No acute infarct, mass effect or extra-axial collection. No acute or chronic hemorrhage. There is multifocal hyperintense T2-weighted signal within the white matter. Parenchymal volume and CSF spaces are normal. Old right occipital infarct. The midline structures are normal. Vascular: Major flow voids are preserved. Skull and upper cervical spine: Normal calvarium and skull base. Visualized upper cervical spine and soft tissues are normal. Sinuses/Orbits:No paranasal sinus fluid levels or advanced mucosal thickening. No mastoid or middle ear effusion. Normal orbits. IMPRESSION: 1. No acute intracranial abnormality. 2. Old right occipital infarct. 3. Chronic small vessel ischemia. Electronically Signed   By: Deatra Robinson M.D.   On: 01/11/2023 22:42   CT HEAD WO CONTRAST  Result Date: 01/11/2023 CLINICAL DATA:  Left-sided numbness for 2 days, initial encounter EXAM: CT HEAD WITHOUT CONTRAST TECHNIQUE: Contiguous axial images were obtained from the base of the skull through the vertex without intravenous contrast. RADIATION DOSE REDUCTION: This exam was performed according to the departmental dose-optimization program which includes automated exposure control, adjustment of the mA and/or kV according to patient size and/or use of iterative reconstruction technique. COMPARISON:  01/02/2019 FINDINGS: Brain: Old right occipital infarct is noted. Lacunar infarcts are seen  within the left head of the caudate nucleus and thalami bilaterally. No acute hemorrhage, acute infarction or space-occupying mass lesion is noted. Vascular: No hyperdense vessel or unexpected calcification. Skull: Normal. Negative for fracture or focal lesion. Sinuses/Orbits: No acute finding. Other: None. IMPRESSION: Chronic changes without acute abnormality. The overall appearance is stable from the prior study. Electronically Signed   By: Alcide Clever M.D.   On: 01/11/2023 21:01    Pending Labs Unresulted Labs (From admission, onward)    None       Vitals/Pain Today's Vitals   01/11/23 2309 01/11/23 2315 01/11/23 2330 01/11/23 2358  BP:  (!) 212/126 (!) 198/115   Pulse:  88 88   Resp:  16 (!) 22   Temp: 97.8 F (36.6 C)     TempSrc: Oral     SpO2:  100% 100%   Weight:      Height:      PainSc:    8     Isolation Precautions No active isolations  Medications Medications  sodium chloride flush (NS) 0.9 % injection 3 mL (3 mLs Intravenous Not Given 01/11/23 2022)  ondansetron (ZOFRAN) injection 4 mg (has no administration in time range)  morphine (PF) 4 MG/ML injection 4 mg (4 mg Intravenous Given 01/11/23 2134)  labetalol (NORMODYNE) injection 10 mg (10 mg Intravenous Given 01/11/23 2241)  hydrALAZINE (APRESOLINE) injection 10 mg (10 mg Intravenous Given 01/11/23 2307)  furosemide (LASIX) injection 40 mg (40 mg Intravenous Given 01/11/23 2349)  hydrALAZINE (APRESOLINE) tablet 100 mg (100 mg Oral Given 01/11/23 2350)  amLODipine (NORVASC) tablet 5 mg (5 mg Oral Given 01/11/23 2350)  morphine (PF) 4 MG/ML injection 4 mg (4 mg Intravenous Given 01/11/23 2359)    Mobility walks     Focused Assessments Cardiac Assessment Handoff:    Lab Results  Component Value Date   TROPONINI 0.03 04/10/2015   Lab Results  Component Value Date   DDIMER 0.40 12/30/2021   Does the Patient currently have chest pain? No    R Recommendations: See Admitting Provider Note  Report given to:    Additional Notes:

## 2023-01-12 NOTE — Progress Notes (Signed)
Patient arrived to room 3w35 from ED.  Assessment complete, VS obtained, and Admission database began.

## 2023-01-12 NOTE — Progress Notes (Signed)
OT Cancellation Note and Discharge  Patient Details Name: Emily Key MRN: [id number] DOB: 01/09/1971   Cancelled Treatment:    Reason Eval/Treat Not Completed: OT screened, no needs identified, will sign off. Spoke with evaluating PT, Ryan. He reports pt reports that all issues with LUE have resolved, still having radicular pain in LLE but walking okay for managing at home and PT is recommending OPPT.   Lindon Romp OT Acute Rehabilitation Services Office [phone]    Evette Georges 01/12/2023, 1:12 PM

## 2023-01-12 NOTE — H&P (Signed)
History and Physical    Bettina Broden YQM:[id number] DOB: 03-May-1971 DOA: 01/11/2023  PCP: Barbette Merino, NP  Patient coming from: Home  Chief Complaint: Left-sided numbness  HPI: Emily Key is a 52 y.o. female with medical history significant of chronic HFrEF/NICM, mild CAD per cath in 2022, history of left VATS with drainage of empyema/abscess and decortication in 2016, severe hypertension/medication noncompliance, pulmonary hypertension, hyperlipidemia, history of seizure presented to ED with left-sided numbness which started yesterday and left leg pain. She reported noncompliance with her home antihypertensive medications. Patient reported chest pain with diaphoresis which resolved on arrival to the ED. Blood pressure 233/124 on arrival but remainder vital signs stable. Labs notable for creatinine 1.2 (baseline 0.9-1.2), blood ethanol level undetectable, troponin slightly elevated but stable (28> 28). EKG without acute ischemic changes. CT head negative for acute abnormality. 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 Patient was given initially given IV hydralazine 10 mg and IV labetalol 10 mg but blood pressure remained elevated with systolic in the 200s and ED PA discussed the case with on-call physician for critical care who felt that ICU admission was not needed and recommended giving the patient IV Lasix 40 mg and her home medication amlodipine 5 mg and oral hydralazine 100 mg after which systolic improved to <200.  Patient also received IV morphine in the ED.  TRH called to admit for hypertensive urgency.  Patient states the day before yesterday (01/10/2023) she had chest pain, diaphoresis, and nausea/vomiting and soon after started having left-sided numbness in her arm and leg and also pain in her left leg.  Denies any vision changes, facial droop, or difficulty with speech.  Denies any facial numbness/paresthesias.  Denies history of prior stroke.   Patient states most of her symptoms have improved except she continues to have pain in her entire left leg.  Denies any injury or trauma to her leg.  Denies history of blood clots.  No other complaints.  Patient states she does not have a PCP and her home blood pressure medications were last refilled by cardiology and she continues to take them and has not missed any doses.  Review of Systems:  Review of Systems  All other systems reviewed and are negative.   Past Medical History:  Diagnosis Date   Cerumen impaction 01/2019   CHF (congestive heart failure) (HCC)    after last pregnancy   Daily headache    Heart murmur    "born w/one":   History of hiatal hernia    Hyperlipidemia 01/2019   Hypertension    Hypertensive emergency 01/02/2019   Observed seizure-like activity (HCC) 01/02/2019   Pneumonia 11/2014   Seasonal allergies 01/2019   Vitamin D deficiency 01/2019    Past Surgical History:  Procedure Laterality Date   CARDIAC CATHETERIZATION N/A 02/13/2015   Procedure: Left Heart Cath and Coronary Angiography;  Surgeon: Lennette Bihari, MD;  Location: [facility];  Service: Cardiovascular;  Laterality: N/A;   PLEURAL EFFUSION DRAINAGE Left 11/14/2014   Procedure: DRAINAGE OF LEFT PLEURAL EFFUSION;  Surgeon: Loreli Slot, MD;  Location: [facility] OR;  Service: Thoracic;  Laterality: Left;   RIGHT/LEFT HEART CATH AND CORONARY ANGIOGRAPHY N/A 12/24/2020   Procedure: RIGHT/LEFT HEART CATH AND CORONARY ANGIOGRAPHY;  Surgeon: Dolores Patty, MD;  Location: [facility];  Service: Cardiovascular;  Laterality: N/A;   TONSILLECTOMY AND ADENOIDECTOMY  ~ 1986   TUBAL LIGATION  2000   VIDEO ASSISTED  THORACOSCOPY (VATS)/DECORTICATION Left 11/14/2014   Procedure: LEFT VIDEO ASSISTED THORACOSCOPY (VATS)/DECORTICATION;  Surgeon: Loreli Slot, MD;  Location: [facility] OR;  Service: Thoracic;  Laterality: Left;     reports that she quit smoking about 8 years ago. Her smoking use  included cigarettes. She has a 3.30 pack-year smoking history. She has never used smokeless tobacco. She reports current alcohol use. She reports that she does not use drugs.  No Known Allergies  Family History  Problem Relation Age of Onset   Diabetes Mellitus II Mother    Hypertension Mother    Lung cancer Father     Prior to Admission medications   Medication Sig Start Date End Date Taking? Authorizing Provider  amLODipine (NORVASC) 5 MG tablet Take 1 tablet (5 mg total) by mouth daily. 01/01/22  Yes Standley Brooking, MD  atorvastatin (LIPITOR) 20 MG tablet Take 1 tablet (20 mg total) by mouth daily. 12/27/21  Yes Bensimhon, Bevelyn Buckles, MD  carvedilol (COREG) 12.5 MG tablet Take 1 tablet (12.5 mg total) by mouth 2 (two) times daily with a meal. 12/27/21  Yes Bensimhon, Bevelyn Buckles, MD  dapagliflozin propanediol (FARXIGA) 10 MG TABS tablet Take 1 tablet (10 mg total) by mouth daily. 12/27/21  Yes Bensimhon, Bevelyn Buckles, MD  furosemide (LASIX) 20 MG tablet Take 1 tablet (20 mg total) by mouth daily. 12/27/21  Yes Bensimhon, Bevelyn Buckles, MD  hydrALAZINE (APRESOLINE) 100 MG tablet Take 1 tablet (100 mg total) by mouth 3 (three) times daily. 12/27/21  Yes Bensimhon, Bevelyn Buckles, MD  isosorbide mononitrate (IMDUR) 30 MG 24 hr tablet Take 2 tablets (60 mg total) by mouth daily. 12/27/21  Yes Bensimhon, Bevelyn Buckles, MD    Physical Exam: Vitals:   01/11/23 2315 01/11/23 2330 01/12/23 0000 01/12/23 0100  BP: (!) 212/126 (!) 198/115 (!) 206/115 (!) 179/117  Pulse: 88 88 85 83  Resp: 16 (!) 22 17 (!) 24  Temp:      TempSrc:      SpO2: 100% 100% 100% 100%  Weight:      Height:        Physical Exam Vitals reviewed.  Constitutional:      General: She is not in acute distress. HENT:     Head: Normocephalic and atraumatic.  Eyes:     Extraocular Movements: Extraocular movements intact.  Cardiovascular:     Rate and Rhythm: Normal rate and regular rhythm.     Pulses: Normal pulses.  Pulmonary:     Effort:  Pulmonary effort is normal. No respiratory distress.     Breath sounds: Normal breath sounds. No wheezing or rales.  Abdominal:     General: Bowel sounds are normal. There is no distension.     Palpations: Abdomen is soft.     Tenderness: There is no abdominal tenderness.  Musculoskeletal:     Cervical back: Normal range of motion.     Right lower leg: No edema.     Left lower leg: No edema.     Comments: Dorsalis pedis pulse 3+ bilaterally, no lower extremity edema  Skin:    General: Skin is warm and dry.  Neurological:     General: No focal deficit present.     Mental Status: She is alert and oriented to person, place, and time.     Cranial Nerves: No cranial nerve deficit.     Sensory: No sensory deficit.     Motor: No weakness.     Labs on Admission: I have personally  reviewed following labs and imaging studies  CBC: Recent Labs  Lab 01/11/23 1935 01/11/23 2011  WBC 8.0  --   NEUTROABS 4.2  --   HGB 13.7 14.6  HCT 42.8 43.0  MCV 84.8  --   PLT 247  --    Basic Metabolic Panel: Recent Labs  Lab 01/11/23 1935 01/11/23 2011  NA 138 140  K 3.9 3.8  CL 103 103  CO2 25  --   GLUCOSE 127* 120*  BUN 17 21*  CREATININE 1.26* 1.40*  CALCIUM 9.4  --    GFR: Estimated Creatinine Clearance: 52 mL/min (A) (by C-G formula based on SCr of 1.4 mg/dL (H)). Liver Function Tests: Recent Labs  Lab 01/11/23 1935  AST 22  ALT 12  ALKPHOS 56  BILITOT 0.6  PROT 7.9  ALBUMIN 4.0   No results for input(s): "LIPASE", "AMYLASE" in the last 168 hours. No results for input(s): "AMMONIA" in the last 168 hours. Coagulation Profile: Recent Labs  Lab 01/11/23 1935  INR 1.0   Cardiac Enzymes: No results for input(s): "CKTOTAL", "CKMB", "CKMBINDEX", "TROPONINI" in the last 168 hours. BNP (last 3 results) No results for input(s): "PROBNP" in the last 8760 hours. HbA1C: No results for input(s): "HGBA1C" in the last 72 hours. CBG: Recent Labs  Lab 01/11/23 1937  GLUCAP  130*   Lipid Profile: No results for input(s): "CHOL", "HDL", "LDLCALC", "TRIG", "CHOLHDL", "LDLDIRECT" in the last 72 hours. Thyroid Function Tests: No results for input(s): "TSH", "T4TOTAL", "FREET4", "T3FREE", "THYROIDAB" in the last 72 hours. Anemia Panel: No results for input(s): "VITAMINB12", "FOLATE", "FERRITIN", "TIBC", "IRON", "RETICCTPCT" in the last 72 hours. Urine analysis:    Component Value Date/Time   COLORURINE YELLOW 12/30/2021 1108   APPEARANCEUR CLEAR 12/30/2021 1108   LABSPEC 1.013 12/30/2021 1108   PHURINE 6.0 12/30/2021 1108   GLUCOSEU >=500 (A) 12/30/2021 1108   HGBUR NEGATIVE 12/30/2021 1108   BILIRUBINUR NEGATIVE 12/30/2021 1108   BILIRUBINUR neg 02/05/2021 1142   KETONESUR NEGATIVE 12/30/2021 1108   PROTEINUR NEGATIVE 12/30/2021 1108   UROBILINOGEN 0.2 02/05/2021 1142   UROBILINOGEN 0.2 02/09/2015 1242   NITRITE NEGATIVE 12/30/2021 1108   LEUKOCYTESUR NEGATIVE 12/30/2021 1108    Radiological Exams on Admission: DG Chest Portable 1 View  Result Date: 01/12/2023 CLINICAL DATA:  Chest pain and high blood pressure EXAM: PORTABLE CHEST 1 VIEW COMPARISON:  12/29/2021 FINDINGS: Stable cardiomediastinal silhouette. Left basilar atelectasis or scarring. No focal consolidation, pleural effusion, or pneumothorax. No displaced rib fractures. IMPRESSION: No active disease. Electronically Signed   By: Minerva Fester M.D.   On: 01/12/2023 00:28   MR BRAIN WO CONTRAST  Result Date: 01/11/2023 CLINICAL DATA:  Acute neurologic deficit EXAM: MRI HEAD WITHOUT CONTRAST TECHNIQUE: Multiplanar, multiecho pulse sequences of the brain and surrounding structures were obtained without intravenous contrast. COMPARISON:  01/02/2019 FINDINGS: Brain: No acute infarct, mass effect or extra-axial collection. No acute or chronic hemorrhage. There is multifocal hyperintense T2-weighted signal within the white matter. Parenchymal volume and CSF spaces are normal. Old right occipital infarct.  The midline structures are normal. Vascular: Major flow voids are preserved. Skull and upper cervical spine: Normal calvarium and skull base. Visualized upper cervical spine and soft tissues are normal. Sinuses/Orbits:No paranasal sinus fluid levels or advanced mucosal thickening. No mastoid or middle ear effusion. Normal orbits. IMPRESSION: 1. No acute intracranial abnormality. 2. Old right occipital infarct. 3. Chronic small vessel ischemia. Electronically Signed   By: Deatra Robinson M.D.   On: 01/11/2023  22:42   CT HEAD WO CONTRAST  Result Date: 01/11/2023 CLINICAL DATA:  Left-sided numbness for 2 days, initial encounter EXAM: CT HEAD WITHOUT CONTRAST TECHNIQUE: Contiguous axial images were obtained from the base of the skull through the vertex without intravenous contrast. RADIATION DOSE REDUCTION: This exam was performed according to the departmental dose-optimization program which includes automated exposure control, adjustment of the mA and/or kV according to patient size and/or use of iterative reconstruction technique. COMPARISON:  01/02/2019 FINDINGS: Brain: Old right occipital infarct is noted. Lacunar infarcts are seen within the left head of the caudate nucleus and thalami bilaterally. No acute hemorrhage, acute infarction or space-occupying mass lesion is noted. Vascular: No hyperdense vessel or unexpected calcification. Skull: Normal. Negative for fracture or focal lesion. Sinuses/Orbits: No acute finding. Other: None. IMPRESSION: Chronic changes without acute abnormality. The overall appearance is stable from the prior study. Electronically Signed   By: Alcide Clever M.D.   On: 01/11/2023 21:01    EKG: Independently reviewed. Sinus rhythm, LAFB, LVH. No acute ischemic changes.   Assessment and Plan  Hypertensive urgency Patient reports taking all of her home antihypertensives but does have documented history of medication noncompliance.  Systolic initially in the 200s, now improved to 170s  after she received IV and oral hydralazine, IV labetalol, IV Lasix, and amlodipine in the ED.  Continue home medications including amlodipine 5 mg daily, Coreg 12.5 mg twice daily, Lasix 20 mg daily, hydralazine 100 mg 3 times daily, and Imdur 60 mg daily.  IV hydralazine PRN SBP >160.  Left-sided numbness No obvious sensory deficit appreciated on exam.  Brain MRI negative for acute stroke.  Chest pain Mild CAD per cath in 2022 and workup at present not suggestive of ACS.  No active chest pain at this time.  Mild troponin elevation likely due to uncontrolled hypertension.  Left leg pain Patient is reporting pain in her entire left leg.  No obvious trauma or injury.  Range of motion intact.  No clinical signs of DVT.  Dorsalis pedis pulse intact and strong.  Chronic HFrEF/NICM Echo done in August 2023 showing improvement of EF to 55 to 60% (previously 20 to 25% in May 2023).  No signs of volume overload at this time.  Patient received IV Lasix in the ED due to uncontrolled hypertension.  Continue home medications including Imdur, Farxiga, Coreg, oral Lasix, and hydralazine.  Patient is off Entresto and spironolactone due to history of AKI.  Hyperlipidemia Continue Lipitor.  DVT prophylaxis: Lovenox Code Status: Full Code (discussed with the patient) Family Communication: No family available at this time. Level of care: Progressive Care Unit Admission status: It is my clinical opinion that referral for OBSERVATION is reasonable and necessary in this patient based on the above information provided. The aforementioned taken together are felt to place the patient at high risk for further clinical deterioration. However, it is anticipated that the patient may be medically stable for discharge from the hospital within 24 to 48 hours.   John Giovanni MD [facility]  If 7PM-7AM, please contact night-coverage [email or web address]  01/12/2023, 1:16 AM

## 2023-01-12 NOTE — TOC Progression Note (Signed)
Transition of Care [facility]) - Progression Note    Patient Details  Name: Emily Key MRN: [id number] Date of Birth: 15-Jul-1970  [facility] CM/SW Contact  Gordy Clement, RN Phone Number: 01/12/2023, 1:17 PM  Clinical Narrative:     OPPT arranged at [facility] location  AVS is updated    TOC will continue to follow patient for any additional discharge needs          Expected Discharge Plan and Services         Expected Discharge Date: 01/12/23                                     Social Determinants of Health (SDOH) Interventions SDOH Screenings   Food Insecurity: Food Insecurity Present (01/12/2023)  Housing: Low Risk  (01/12/2023)  Transportation Needs: Unmet Transportation Needs (01/12/2023)  Utilities: Not At Risk (01/12/2023)  Depression (PHQ2-9): Medium Risk (02/05/2021)  Financial Resource Strain: High Risk (12/23/2020)  Tobacco Use: Medium Risk (01/11/2023)    Readmission Risk Interventions     No data to display

## 2023-01-13 ENCOUNTER — Other Ambulatory Visit: Payer: Self-pay

## 2023-01-16 ENCOUNTER — Telehealth: Payer: Self-pay

## 2023-01-16 NOTE — Transitions of Care (Post Inpatient/ED Visit) (Signed)
   01/16/2023  Name: Emily Key MRN: [id number] DOB: [birth year]  Today's TOC FU Call Status: Today's TOC FU Call Status:: Successful TOC FU Call Competed TOC FU Call Complete Date: 01/16/23  Transition Care Management Follow-up Telephone Call Date of Discharge: 01/12/23 Discharge Facility: [facility]) Type of Discharge: Inpatient Admission Primary Inpatient Discharge Diagnosis:: Hypertensive urgency How have you been since you were released from the hospital?: Better Any questions or concerns?: No  Items Reviewed: Did you receive and understand the discharge instructions provided?: Yes Medications obtained,verified, and reconciled?: Yes (Medications Reviewed) Any new allergies since your discharge?: No Dietary orders reviewed?: Yes Do you have support at home?: Yes  Medications Reviewed Today: Medications Reviewed Today     Reviewed by Merleen Nicely, LPN (Licensed Practical Nurse) on 01/16/23 at 1137  Med List Status: <None>   Medication Order Taking? Sig Documenting Provider Last Dose Status Informant  amLODipine (NORVASC) 5 MG tablet [id number] Yes Take 1 tablet (5 mg total) by mouth daily. Standley Brooking, MD Taking Active Self  atorvastatin (LIPITOR) 20 MG tablet [id number] Yes Take 1 tablet (20 mg total) by mouth daily. Bensimhon, Bevelyn Buckles, MD Taking Active Self  carvedilol (COREG) 12.5 MG tablet [id number] Yes Take 1 tablet (12.5 mg total) by mouth 2 (two) times daily with a meal. Bensimhon, Bevelyn Buckles, MD Taking Active Self  cyanocobalamin (VITAMIN B12) 1000 MCG tablet [id number] Yes Take 1 tablet (1,000 mcg total) by mouth daily. Almon Hercules, MD Taking Active   dapagliflozin propanediol (FARXIGA) 10 MG TABS tablet [id number] Yes Take 1 tablet (10 mg total) by mouth daily. Bensimhon, Bevelyn Buckles, MD Taking Active Self  furosemide (LASIX) 20 MG tablet [id number] Yes Take 1 tablet (20 mg total) by mouth daily. Bensimhon, Bevelyn Buckles, MD Taking Active Self  gabapentin  (NEURONTIN) 300 MG capsule [id number] Yes Take 1 capsule (300 mg total) by mouth 2 (two) times daily. Almon Hercules, MD Taking Active   hydrALAZINE (APRESOLINE) 100 MG tablet [id number] Yes Take 1 tablet (100 mg total) by mouth 3 (three) times daily. Bensimhon, Bevelyn Buckles, MD Taking Active Self  isosorbide mononitrate (IMDUR) 30 MG 24 hr tablet [id number] Yes Take 2 tablets (60 mg total) by mouth daily. Bensimhon, Bevelyn Buckles, MD Taking Active Self  predniSONE (STERAPRED UNI-PAK 21 TAB) 10 MG (21) TBPK tablet [id number] Yes take as directed on pack Almon Hercules, MD Taking Active             Home Care and Equipment/Supplies: Were Home Health Services Ordered?: No Any new equipment or medical supplies ordered?: No  Functional Questionnaire: Do you need assistance with bathing/showering or dressing?: No Do you need assistance with meal preparation?: No Do you need assistance with eating?: No Do you have difficulty maintaining continence: No Do you need assistance with getting out of bed/getting out of a chair/moving?: No Do you have difficulty managing or taking your medications?: No  Follow up appointments reviewed: PCP Follow-up appointment confirmed?: Yes Date of PCP follow-up appointment?: 01/24/23 Follow-up Provider: [facility] Follow-up appointment confirmed?: NA Do you need transportation to your follow-up appointment?: No Do you understand care options if your condition(s) worsen?: Yes-patient verbalized understanding    SIGNATURE  Woodfin Ganja LPN [facility] Nurse Health Advisor Direct Dial [phone]

## 2023-01-24 ENCOUNTER — Ambulatory Visit (INDEPENDENT_AMBULATORY_CARE_PROVIDER_SITE_OTHER): Payer: BLUE CROSS/BLUE SHIELD | Admitting: Nurse Practitioner

## 2023-01-24 ENCOUNTER — Encounter: Payer: Self-pay | Admitting: Nurse Practitioner

## 2023-01-24 ENCOUNTER — Other Ambulatory Visit: Payer: Self-pay

## 2023-01-24 VITALS — BP 176/80 | HR 79 | Temp 97.1°F | Ht 67.0 in | Wt 205.8 lb

## 2023-01-24 DIAGNOSIS — I160 Hypertensive urgency: Secondary | ICD-10-CM

## 2023-01-24 DIAGNOSIS — F17200 Nicotine dependence, unspecified, uncomplicated: Secondary | ICD-10-CM

## 2023-01-24 DIAGNOSIS — I5041 Acute combined systolic (congestive) and diastolic (congestive) heart failure: Secondary | ICD-10-CM

## 2023-01-24 DIAGNOSIS — N183 Chronic kidney disease, stage 3 unspecified: Secondary | ICD-10-CM

## 2023-01-24 DIAGNOSIS — M79605 Pain in left leg: Secondary | ICD-10-CM | POA: Diagnosis not present

## 2023-01-24 DIAGNOSIS — E785 Hyperlipidemia, unspecified: Secondary | ICD-10-CM

## 2023-01-24 DIAGNOSIS — I5022 Chronic systolic (congestive) heart failure: Secondary | ICD-10-CM

## 2023-01-24 DIAGNOSIS — R200 Anesthesia of skin: Secondary | ICD-10-CM

## 2023-01-24 DIAGNOSIS — Z1231 Encounter for screening mammogram for malignant neoplasm of breast: Secondary | ICD-10-CM | POA: Insufficient documentation

## 2023-01-24 DIAGNOSIS — F1910 Other psychoactive substance abuse, uncomplicated: Secondary | ICD-10-CM

## 2023-01-24 DIAGNOSIS — I429 Cardiomyopathy, unspecified: Secondary | ICD-10-CM | POA: Diagnosis not present

## 2023-01-24 DIAGNOSIS — Z09 Encounter for follow-up examination after completed treatment for conditions other than malignant neoplasm: Secondary | ICD-10-CM | POA: Insufficient documentation

## 2023-01-24 DIAGNOSIS — I5042 Chronic combined systolic (congestive) and diastolic (congestive) heart failure: Secondary | ICD-10-CM

## 2023-01-24 DIAGNOSIS — N1831 Chronic kidney disease, stage 3a: Secondary | ICD-10-CM

## 2023-01-24 DIAGNOSIS — Z1211 Encounter for screening for malignant neoplasm of colon: Secondary | ICD-10-CM | POA: Insufficient documentation

## 2023-01-24 DIAGNOSIS — F17210 Nicotine dependence, cigarettes, uncomplicated: Secondary | ICD-10-CM

## 2023-01-24 MED ORDER — DAPAGLIFLOZIN PROPANEDIOL 10 MG PO TABS
10.0000 mg | ORAL_TABLET | Freq: Every day | ORAL | 3 refills | Status: DC
Start: 2023-01-24 — End: 2024-04-15
  Filled 2023-01-24: qty 90, 90d supply, fill #0
  Filled 2023-09-07: qty 90, 90d supply, fill #1
  Filled 2023-09-07: qty 30, 30d supply, fill #1

## 2023-01-24 MED ORDER — ISOSORBIDE MONONITRATE ER 30 MG PO TB24
60.0000 mg | ORAL_TABLET | Freq: Every day | ORAL | 3 refills | Status: DC
Start: 2023-01-24 — End: 2024-04-15
  Filled 2023-01-24: qty 180, 90d supply, fill #0
  Filled 2023-09-07: qty 180, 90d supply, fill #1

## 2023-01-24 MED ORDER — FUROSEMIDE 20 MG PO TABS
20.0000 mg | ORAL_TABLET | Freq: Every day | ORAL | 3 refills | Status: DC
Start: 2023-01-24 — End: 2023-01-30
  Filled 2023-01-24: qty 90, 90d supply, fill #0

## 2023-01-24 MED ORDER — HYDRALAZINE HCL 100 MG PO TABS
100.0000 mg | ORAL_TABLET | Freq: Three times a day (TID) | ORAL | 3 refills | Status: DC
Start: 2023-01-24 — End: 2024-04-15
  Filled 2023-01-24: qty 270, 90d supply, fill #0

## 2023-01-24 MED ORDER — AMLODIPINE BESYLATE 5 MG PO TABS
5.0000 mg | ORAL_TABLET | Freq: Every day | ORAL | 1 refills | Status: DC
Start: 2023-01-24 — End: 2024-04-15
  Filled 2023-01-24: qty 90, 90d supply, fill #0
  Filled 2023-09-07: qty 90, 90d supply, fill #1

## 2023-01-24 MED ORDER — CARVEDILOL 12.5 MG PO TABS
12.5000 mg | ORAL_TABLET | Freq: Two times a day (BID) | ORAL | 1 refills | Status: DC
Start: 2023-01-24 — End: 2024-04-15
  Filled 2023-01-24: qty 180, 90d supply, fill #0

## 2023-01-24 MED ORDER — ATORVASTATIN CALCIUM 20 MG PO TABS
20.0000 mg | ORAL_TABLET | Freq: Every day | ORAL | 1 refills | Status: DC
Start: 2023-01-24 — End: 2024-04-15
  Filled 2023-01-24: qty 90, 90d supply, fill #0
  Filled 2023-09-07: qty 90, 90d supply, fill #1

## 2023-01-24 NOTE — Assessment & Plan Note (Signed)
Continue - carvedilol (COREG) 12.5 MG tablet; Take 1 tablet (12.5 mg total) by mouth 2 (two) times daily with a meal.  Dispense: 180 tablet; Refill: 1 - dapagliflozin propanediol (FARXIGA) 10 MG TABS tablet; Take 1 tablet (10 mg total) by mouth daily.  Dispense: 90 tablet; Refill: 3 - furosemide (LASIX) 20 MG tablet; Take 1 tablet (20 mg total) by mouth daily.  Dispense: 90 tablet; Refill: 3 - isosorbide mononitrate (IMDUR) 30 MG 24 hr tablet; Take 2 tablets (60 mg total) by mouth daily.  Dispense: 180 tablet; Refill: 3 All medications refilled Need to take medications daily as ordered discussed DASH diet advised engage in regular moderate exercises at least 150 minutes weekly Patient referred to cardiology Follow-up in 6 weeks Smoking cessation encouraged

## 2023-01-24 NOTE — Progress Notes (Signed)
New Patient Office Visit  Subjective:  Patient ID: Emily Key, female    DOB: [birth year]  Age: 52 y.o. MRN: [id number]  CC:  Chief Complaint  Patient presents with   Follow-up    Hospital follow up    HPI Emily Key is a 52 y.o. female  has a past medical history of Cerumen impaction (01/2019), CHF (congestive heart failure) (HCC), Daily headache, Heart murmur, History of hiatal hernia, Hyperlipidemia (01/2019), Hypertension, Hypertensive emergency (01/02/2019), Observed seizure-like activity (HCC) (01/02/2019), Pneumonia (11/2014), Seasonal allergies (01/2019), and Vitamin D deficiency (01/2019).   Patient presents for hospital discharge follow-up and to reestablish care for her chronic medical conditions  Hypertensive urgency .patient was on admission at the hospital from January 11, 2023 to January 12, 2023 for hypertensive urgency.  She had been noncompliant with her cardiac medications prior to presenting to the hospital.  Patient stated that she has been taking her medications daily as ordered since she left the hospital a little she did not take her medications today.  She denies chest pain, shortness of breath, edema, has a blood pressure cuff at home but does not check her blood pressure.  Left sided leg pain has improved on gabapentin, she has not had from orthopedic rehab yet.   Occasional tobacco smoker.  Smokes 1 cigarette on some days.  She denies shortness of breath, wheezing, cough.  She denies use of illicit drugs   Due for mammogram, colon cancer screening, referral was made.  Patient encouraged to get her Tdap and shingles vaccine at the pharmacy  Plans on getting her Pap smear at next visit.       Past Medical History:  Diagnosis Date   Cerumen impaction 01/2019   CHF (congestive heart failure) (HCC)    after last pregnancy   Daily headache    Heart murmur    "born w/one":   History of hiatal hernia    Hyperlipidemia 01/2019   Hypertension     Hypertensive emergency 01/02/2019   Observed seizure-like activity (HCC) 01/02/2019   Pneumonia 11/2014   Seasonal allergies 01/2019   Vitamin D deficiency 01/2019    Past Surgical History:  Procedure Laterality Date   CARDIAC CATHETERIZATION N/A 02/13/2015   Procedure: Left Heart Cath and Coronary Angiography;  Surgeon: Lennette Bihari, MD;  Location: [facility];  Service: Cardiovascular;  Laterality: N/A;   PLEURAL EFFUSION DRAINAGE Left 11/14/2014   Procedure: DRAINAGE OF LEFT PLEURAL EFFUSION;  Surgeon: Loreli Slot, MD;  Location: [facility] OR;  Service: Thoracic;  Laterality: Left;   RIGHT/LEFT HEART CATH AND CORONARY ANGIOGRAPHY N/A 12/24/2020   Procedure: RIGHT/LEFT HEART CATH AND CORONARY ANGIOGRAPHY;  Surgeon: Dolores Patty, MD;  Location: [facility];  Service: Cardiovascular;  Laterality: N/A;   TONSILLECTOMY AND ADENOIDECTOMY  ~ 1986   TUBAL LIGATION  2000   VIDEO ASSISTED THORACOSCOPY (VATS)/DECORTICATION Left 11/14/2014   Procedure: LEFT VIDEO ASSISTED THORACOSCOPY (VATS)/DECORTICATION;  Surgeon: Loreli Slot, MD;  Location: [facility] OR;  Service: Thoracic;  Laterality: Left;    Family History  Problem Relation Age of Onset   Diabetes Mellitus II Mother    Hypertension Mother    Kidney failure Mother    Lung cancer Father    Stroke Maternal Grandmother     Social History   Socioeconomic History   Marital status: Divorced    Spouse name: Not on file   Number of children: 2   Years of education: Not on file  Highest education level: Not on file  Occupational History   Not on file  Tobacco Use   Smoking status: Every Day    Current packs/day: 0.00    Average packs/day: 0.3 packs/day for 10.0 years (3.3 ttl pk-yrs)    Types: Cigarettes    Start date: 11/10/2004    Last attempt to quit: 11/11/2014    Years since quitting: 8.2   Smokeless tobacco: Never  Substance and Sexual Activity   Alcohol use: Yes    Alcohol/week: 0.0 standard drinks of  alcohol    Comment: 8/1/20176 "a couple beers a couple times/month"   Drug use: No   Sexual activity: Yes  Other Topics Concern   Not on file  Social History Narrative   Lives with her sister    Social Determinants of Health   Financial Resource Strain: High Risk (12/23/2020)   Overall Financial Resource Strain (CARDIA)    Difficulty of Paying Living Expenses: Hard  Food Insecurity: Food Insecurity Present (01/12/2023)   Hunger Vital Sign    Worried About Running Out of Food in the Last Year: Sometimes true    Ran Out of Food in the Last Year: Sometimes true  Transportation Needs: Unmet Transportation Needs (01/12/2023)   PRAPARE - Administrator, Civil Service (Medical): Yes    Lack of Transportation (Non-Medical): Yes  Physical Activity: Not on file  Stress: Not on file  Social Connections: Not on file  Intimate Partner Violence: Not At Risk (01/12/2023)   Humiliation, Afraid, Rape, and Kick questionnaire    Fear of Current or Ex-Partner: No    Emotionally Abused: No    Physically Abused: No    Sexually Abused: No    ROS Review of Systems  Constitutional:  Negative for activity change, appetite change, chills, fatigue and fever.  HENT:  Negative for congestion, dental problem, ear discharge, ear pain, hearing loss, rhinorrhea, sinus pressure, sinus pain, sneezing and sore throat.   Eyes:  Negative for pain, discharge, redness and itching.  Respiratory:  Negative for cough, chest tightness, shortness of breath and wheezing.   Cardiovascular:  Negative for chest pain, palpitations and leg swelling.  Gastrointestinal:  Negative for abdominal distention, abdominal pain, anal bleeding, blood in stool, constipation, diarrhea, nausea, rectal pain and vomiting.  Endocrine: Negative for cold intolerance, heat intolerance, polydipsia, polyphagia and polyuria.  Genitourinary:  Negative for difficulty urinating, dysuria, flank pain, frequency, hematuria, menstrual problem, pelvic  pain and vaginal bleeding.  Musculoskeletal:  Negative for arthralgias, back pain, gait problem, joint swelling and myalgias.  Skin:  Negative for color change, pallor, rash and wound.  Allergic/Immunologic: Negative for environmental allergies, food allergies and immunocompromised state.  Neurological:  Negative for dizziness, tremors, facial asymmetry, weakness and headaches.  Hematological:  Negative for adenopathy. Does not bruise/bleed easily.  Psychiatric/Behavioral:  Negative for agitation, behavioral problems, confusion, decreased concentration, hallucinations, self-injury and suicidal ideas.     Objective:   Today's Vitals: BP (!) 176/80   Pulse 79   Temp (!) 97.1 F (36.2 C)   Ht 5\' 7"  (1.702 m)   Wt 205 lb 12.8 oz (93.4 kg)   LMP 12/31/2014   SpO2 98%   BMI 32.23 kg/m   Physical Exam Vitals and nursing note reviewed.  Constitutional:      General: She is not in acute distress.    Appearance: Normal appearance. She is obese. She is not ill-appearing, toxic-appearing or diaphoretic.  HENT:     Mouth/Throat:  Mouth: Mucous membranes are moist.     Pharynx: Oropharynx is clear. No oropharyngeal exudate or posterior oropharyngeal erythema.  Eyes:     General: No scleral icterus.       Right eye: No discharge.        Left eye: No discharge.     Extraocular Movements: Extraocular movements intact.     Conjunctiva/sclera: Conjunctivae normal.  Cardiovascular:     Rate and Rhythm: Normal rate and regular rhythm.     Pulses: Normal pulses.     Heart sounds: Murmur heard.     No friction rub. No gallop.  Pulmonary:     Effort: Pulmonary effort is normal. No respiratory distress.     Breath sounds: Normal breath sounds. No stridor. No wheezing, rhonchi or rales.  Chest:     Chest wall: No tenderness.  Abdominal:     General: There is no distension.     Palpations: Abdomen is soft.     Tenderness: There is no abdominal tenderness. There is no right CVA tenderness,  left CVA tenderness or guarding.  Musculoskeletal:        General: No swelling, deformity or signs of injury.     Right lower leg: No edema.     Left lower leg: No edema.  Skin:    General: Skin is warm and dry.     Capillary Refill: Capillary refill takes less than 2 seconds.     Coloration: Skin is not jaundiced or pale.     Findings: No bruising, erythema or lesion.  Neurological:     Mental Status: She is alert and oriented to person, place, and time.     Motor: No weakness.     Coordination: Coordination normal.     Gait: Gait normal.  Psychiatric:        Mood and Affect: Mood normal.        Behavior: Behavior normal.        Thought Content: Thought content normal.        Judgment: Judgment normal.     Assessment & Plan:   Problem List Items Addressed This Visit       Cardiovascular and Mediastinum   Hypertensive urgency - Primary     - amLODipine (NORVASC) 5 MG tablet; Take 1 tablet (5 mg total) by mouth daily.  Dispense: 90 tablet; Refill: 1 - atorvastatin (LIPITOR) 20 MG tablet; Take 1 tablet (20 mg total) by mouth daily.  Dispense: 90 tablet; Refill: 1 - carvedilol (COREG) 12.5 MG tablet; Take 1 tablet (12.5 mg total) by mouth 2 (two) times daily with a meal.  Dispense: 180 tablet; Refill: 1 - dapagliflozin propanediol (FARXIGA) 10 MG TABS tablet; Take 1 tablet (10 mg total) by mouth daily.  Dispense: 90 tablet; Refill: 3 - furosemide (LASIX) 20 MG tablet; Take 1 tablet (20 mg total) by mouth daily.  Dispense: 90 tablet; Refill: 3 - hydrALAZINE (APRESOLINE) 100 MG tablet; Take 1 tablet (100 mg total) by mouth 3 (three) times daily.  Dispense: 270 tablet; Refill: 3 - isosorbide mononitrate (IMDUR) 30 MG 24 hr tablet; Take 2 tablets (60 mg total) by mouth daily.  Dispense: 180 tablet; Refill: 3 All medications refilled Need to take medications daily as ordered discussed DASH diet encouraged Patient referred to cardiology Follow-up in 6 weeks BP Readings from Last 3  Encounters:  01/24/23 (!) 176/80  01/12/23 134/67  03/10/22 130/88           Relevant Medications   amLODipine (  NORVASC) 5 MG tablet   atorvastatin (LIPITOR) 20 MG tablet   carvedilol (COREG) 12.5 MG tablet   dapagliflozin propanediol (FARXIGA) 10 MG TABS tablet   furosemide (LASIX) 20 MG tablet   hydrALAZINE (APRESOLINE) 100 MG tablet   isosorbide mononitrate (IMDUR) 30 MG 24 hr tablet   Other Relevant Orders   CBC   CMP14+EGFR   Cardiomyopathy (HCC)    Continue - carvedilol (COREG) 12.5 MG tablet; Take 1 tablet (12.5 mg total) by mouth 2 (two) times daily with a meal.  Dispense: 180 tablet; Refill: 1 - dapagliflozin propanediol (FARXIGA) 10 MG TABS tablet; Take 1 tablet (10 mg total) by mouth daily.  Dispense: 90 tablet; Refill: 3 - furosemide (LASIX) 20 MG tablet; Take 1 tablet (20 mg total) by mouth daily.  Dispense: 90 tablet; Refill: 3 - isosorbide mononitrate (IMDUR) 30 MG 24 hr tablet; Take 2 tablets (60 mg total) by mouth daily.  Dispense: 180 tablet; Refill: 3 All medications refilled Need to take medications daily as ordered discussed DASH diet advised engage in regular moderate exercises at least 150 minutes weekly Patient referred to cardiology Follow-up in 6 weeks Smoking cessation encouraged      Relevant Medications   amLODipine (NORVASC) 5 MG tablet   atorvastatin (LIPITOR) 20 MG tablet   carvedilol (COREG) 12.5 MG tablet   furosemide (LASIX) 20 MG tablet   hydrALAZINE (APRESOLINE) 100 MG tablet   isosorbide mononitrate (IMDUR) 30 MG 24 hr tablet   Other Relevant Orders   Ambulatory referral to Cardiology   Chronic systolic CHF (congestive heart failure) (HCC)    Continue - carvedilol (COREG) 12.5 MG tablet; Take 1 tablet (12.5 mg total) by mouth 2 (two) times daily with a meal.  Dispense: 180 tablet; Refill: 1 - dapagliflozin propanediol (FARXIGA) 10 MG TABS tablet; Take 1 tablet (10 mg total) by mouth daily.  Dispense: 90 tablet; Refill: 3 -  furosemide (LASIX) 20 MG tablet; Take 1 tablet (20 mg total) by mouth daily.  Dispense: 90 tablet; Refill: 3 - isosorbide mononitrate (IMDUR) 30 MG 24 hr tablet; Take 2 tablets (60 mg total) by mouth daily.  Dispense: 180 tablet; Refill: 3 All medications refilled Need to take medications daily as ordered discussed DASH diet advised engage in regular moderate exercises at least 150 minutes weekly Patient referred to cardiology Follow-up in 6 weeks      Relevant Medications   amLODipine (NORVASC) 5 MG tablet   atorvastatin (LIPITOR) 20 MG tablet   carvedilol (COREG) 12.5 MG tablet   dapagliflozin propanediol (FARXIGA) 10 MG TABS tablet   furosemide (LASIX) 20 MG tablet   hydrALAZINE (APRESOLINE) 100 MG tablet   isosorbide mononitrate (IMDUR) 30 MG 24 hr tablet     Genitourinary   Chronic kidney disease (Chronic)    Lab Results  Component Value Date   NA 135 01/12/2023   K 3.5 01/12/2023   CO2 25 01/12/2023   GLUCOSE 139 (H) 01/12/2023   BUN 17 01/12/2023   CREATININE 1.16 (H) 01/12/2023   CALCIUM 9.5 01/12/2023   EGFR 73 02/05/2021   GFRNONAA 56 (L) 01/12/2023  On Farxiga 10 mg daily Avoid NSAIDs and other nephrotoxic agents        Other   Substance abuse (HCC)    Patient denies substance use, But her drug screen was positive for opiates and Tetrahydrocannabino , while at the hospital      Tobacco dependence    States that she smokes about 1 cigarettes on some days  Cessation counseled      Hyperlipidemia    Continue Lipitor 20 mg daily Will recheck labs at next visit Lab Results  Component Value Date   CHOL 223 (H) 02/05/2021   HDL 82 02/05/2021   LDLCALC 127 (H) 02/05/2021   TRIG 79 02/05/2021   CHOLHDL 2.7 02/05/2021         Relevant Medications   amLODipine (NORVASC) 5 MG tablet   atorvastatin (LIPITOR) 20 MG tablet   carvedilol (COREG) 12.5 MG tablet   furosemide (LASIX) 20 MG tablet   hydrALAZINE (APRESOLINE) 100 MG tablet   isosorbide mononitrate  (IMDUR) 30 MG 24 hr tablet   Left sided numbness    MPRESSION: No complaints today, continue gabapentin 600 mg twice daily Follow-up with orthopedic rehab  1. At L3-4 there is a broad-based disc bulge with a central/right paracentral broad shallow disc protrusion. Mild bilateral foraminal stenosis. Mild bilateral lateral recess stenosis. 2. At L4-5 there is a mild broad-based disc bulge. Mild bilateral facet arthropathy. Moderate bilateral foraminal stenosis. 3. At L5-S1 there is a mild broad-based disc bulge with a small central disc protrusion. Moderate right and mild left foraminal stenosis. 4. No acute osseous injury of the lumbar spine.        Left leg pain    Much improved on gabapentin 300 mg twice daily Current no current medication Referral to orthopedic rehab is pending      Screening mammogram for breast cancer   Relevant Orders   MM Digital Screening   Screening for colon cancer   Relevant Orders   Ambulatory referral to Gastroenterology   Hospital discharge follow-up    Hospital discharge summary, labs and imaging studies reviewed by me today      Other Visit Diagnoses     Chronic combined systolic and diastolic congestive heart failure (HCC)       Relevant Medications   amLODipine (NORVASC) 5 MG tablet   atorvastatin (LIPITOR) 20 MG tablet   carvedilol (COREG) 12.5 MG tablet   dapagliflozin propanediol (FARXIGA) 10 MG TABS tablet   furosemide (LASIX) 20 MG tablet   hydrALAZINE (APRESOLINE) 100 MG tablet   isosorbide mononitrate (IMDUR) 30 MG 24 hr tablet   Other Relevant Orders   Ambulatory referral to Cardiology       Outpatient Encounter Medications as of 01/24/2023  Medication Sig   cyanocobalamin (VITAMIN B12) 1000 MCG tablet Take 1 tablet (1,000 mcg total) by mouth daily.   gabapentin (NEURONTIN) 300 MG capsule Take 1 capsule (300 mg total) by mouth 2 (two) times daily.   [DISCONTINUED] amLODipine (NORVASC) 5 MG tablet Take 1 tablet (5 mg  total) by mouth daily.   [DISCONTINUED] atorvastatin (LIPITOR) 20 MG tablet Take 1 tablet (20 mg total) by mouth daily.   [DISCONTINUED] carvedilol (COREG) 12.5 MG tablet Take 1 tablet (12.5 mg total) by mouth 2 (two) times daily with a meal.   [DISCONTINUED] dapagliflozin propanediol (FARXIGA) 10 MG TABS tablet Take 1 tablet (10 mg total) by mouth daily.   [DISCONTINUED] furosemide (LASIX) 20 MG tablet Take 1 tablet (20 mg total) by mouth daily.   [DISCONTINUED] hydrALAZINE (APRESOLINE) 100 MG tablet Take 1 tablet (100 mg total) by mouth 3 (three) times daily.   [DISCONTINUED] isosorbide mononitrate (IMDUR) 30 MG 24 hr tablet Take 2 tablets (60 mg total) by mouth daily.   amLODipine (NORVASC) 5 MG tablet Take 1 tablet (5 mg total) by mouth daily.   atorvastatin (LIPITOR) 20 MG tablet Take 1 tablet (  20 mg total) by mouth daily.   carvedilol (COREG) 12.5 MG tablet Take 1 tablet (12.5 mg total) by mouth 2 (two) times daily with a meal.   dapagliflozin propanediol (FARXIGA) 10 MG TABS tablet Take 1 tablet (10 mg total) by mouth daily.   furosemide (LASIX) 20 MG tablet Take 1 tablet (20 mg total) by mouth daily.   hydrALAZINE (APRESOLINE) 100 MG tablet Take 1 tablet (100 mg total) by mouth 3 (three) times daily.   isosorbide mononitrate (IMDUR) 30 MG 24 hr tablet Take 2 tablets (60 mg total) by mouth daily.   [DISCONTINUED] predniSONE (STERAPRED UNI-PAK 21 TAB) 10 MG (21) TBPK tablet take as directed on pack (Patient not taking: Reported on 01/24/2023)   No facility-administered encounter medications on file as of 01/24/2023.    Follow-up: Return in about 6 weeks (around 03/07/2023) for CPE.   Donell Beers, FNP

## 2023-01-24 NOTE — Assessment & Plan Note (Signed)
Continue - carvedilol (COREG) 12.5 MG tablet; Take 1 tablet (12.5 mg total) by mouth 2 (two) times daily with a meal.  Dispense: 180 tablet; Refill: 1 - dapagliflozin propanediol (FARXIGA) 10 MG TABS tablet; Take 1 tablet (10 mg total) by mouth daily.  Dispense: 90 tablet; Refill: 3 - furosemide (LASIX) 20 MG tablet; Take 1 tablet (20 mg total) by mouth daily.  Dispense: 90 tablet; Refill: 3 - isosorbide mononitrate (IMDUR) 30 MG 24 hr tablet; Take 2 tablets (60 mg total) by mouth daily.  Dispense: 180 tablet; Refill: 3 All medications refilled Need to take medications daily as ordered discussed DASH diet advised engage in regular moderate exercises at least 150 minutes weekly Patient referred to cardiology Follow-up in 6 weeks

## 2023-01-24 NOTE — Assessment & Plan Note (Signed)
Much improved on gabapentin 300 mg twice daily Current no current medication Referral to orthopedic rehab is pending

## 2023-01-24 NOTE — Assessment & Plan Note (Signed)
Hospital discharge summary, labs and imaging studies reviewed by me today

## 2023-01-24 NOTE — Assessment & Plan Note (Signed)
States that she smokes about 1 cigarettes on some days Cessation counseled

## 2023-01-24 NOTE — Assessment & Plan Note (Signed)
-   amLODipine (NORVASC) 5 MG tablet; Take 1 tablet (5 mg total) by mouth daily.  Dispense: 90 tablet; Refill: 1 - atorvastatin (LIPITOR) 20 MG tablet; Take 1 tablet (20 mg total) by mouth daily.  Dispense: 90 tablet; Refill: 1 - carvedilol (COREG) 12.5 MG tablet; Take 1 tablet (12.5 mg total) by mouth 2 (two) times daily with a meal.  Dispense: 180 tablet; Refill: 1 - dapagliflozin propanediol (FARXIGA) 10 MG TABS tablet; Take 1 tablet (10 mg total) by mouth daily.  Dispense: 90 tablet; Refill: 3 - furosemide (LASIX) 20 MG tablet; Take 1 tablet (20 mg total) by mouth daily.  Dispense: 90 tablet; Refill: 3 - hydrALAZINE (APRESOLINE) 100 MG tablet; Take 1 tablet (100 mg total) by mouth 3 (three) times daily.  Dispense: 270 tablet; Refill: 3 - isosorbide mononitrate (IMDUR) 30 MG 24 hr tablet; Take 2 tablets (60 mg total) by mouth daily.  Dispense: 180 tablet; Refill: 3 All medications refilled Need to take medications daily as ordered discussed DASH diet encouraged Patient referred to cardiology Follow-up in 6 weeks BP Readings from Last 3 Encounters:  01/24/23 (!) 176/80  01/12/23 134/67  03/10/22 130/88

## 2023-01-24 NOTE — Patient Instructions (Addendum)
Please get your TDAP vaccine and shingles vaccine at the pharmacy  You have been referred for colonoscopy, and mammogram,  also referred to cardiology.    Please take all your medications as ordered blood pressure goal is less than 130/80   It is important that you exercise regularly at least 30 minutes 5 times a week as tolerated  Think about what you will eat, plan ahead. Choose " clean, green, fresh or frozen" over canned, processed or packaged foods which are more sugary, salty and fatty. 70 to 75% of food eaten should be vegetables and fruit. Three meals at set times with snacks allowed between meals, but they must be fruit or vegetables. Aim to eat over a 12 hour period , example 7 am to 7 pm, and STOP after  your last meal of the day. Drink water,generally about 64 ounces per day, no other drink is as healthy. Fruit juice is best enjoyed in a healthy way, by EATING the fruit.  Thanks for choosing [facility] we consider it a privelige to serve you.

## 2023-01-24 NOTE — Assessment & Plan Note (Signed)
Continue Lipitor 20 mg daily Will recheck labs at next visit Lab Results  Component Value Date   CHOL 223 (H) 02/05/2021   HDL 82 02/05/2021   LDLCALC 127 (H) 02/05/2021   TRIG 79 02/05/2021   CHOLHDL 2.7 02/05/2021

## 2023-01-24 NOTE — Assessment & Plan Note (Signed)
Patient denies substance use, But her drug screen was positive for opiates and Tetrahydrocannabino , while at the hospital

## 2023-01-24 NOTE — Assessment & Plan Note (Signed)
Lab Results  Component Value Date   NA 135 01/12/2023   K 3.5 01/12/2023   CO2 25 01/12/2023   GLUCOSE 139 (H) 01/12/2023   BUN 17 01/12/2023   CREATININE 1.16 (H) 01/12/2023   CALCIUM 9.5 01/12/2023   EGFR 73 02/05/2021   GFRNONAA 56 (L) 01/12/2023  On Farxiga 10 mg daily Avoid NSAIDs and other nephrotoxic agents

## 2023-01-24 NOTE — Assessment & Plan Note (Signed)
MPRESSION: No complaints today, continue gabapentin 600 mg twice daily Follow-up with orthopedic rehab  1. At L3-4 there is a broad-based disc bulge with a central/right paracentral broad shallow disc protrusion. Mild bilateral foraminal stenosis. Mild bilateral lateral recess stenosis. 2. At L4-5 there is a mild broad-based disc bulge. Mild bilateral facet arthropathy. Moderate bilateral foraminal stenosis. 3. At L5-S1 there is a mild broad-based disc bulge with a small central disc protrusion. Moderate right and mild left foraminal stenosis. 4. No acute osseous injury of the lumbar spine.

## 2023-01-25 ENCOUNTER — Other Ambulatory Visit: Payer: Self-pay

## 2023-01-25 ENCOUNTER — Other Ambulatory Visit: Payer: Self-pay | Admitting: Nurse Practitioner

## 2023-01-25 DIAGNOSIS — R739 Hyperglycemia, unspecified: Secondary | ICD-10-CM

## 2023-01-25 LAB — CMP14+EGFR
ALT: 10 IU/L (ref 0–32)
AST: 16 IU/L (ref 0–40)
Albumin: 4.3 g/dL (ref 3.8–4.9)
Alkaline Phosphatase: 71 IU/L (ref 44–121)
BUN/Creatinine Ratio: 22 (ref 9–23)
BUN: 25 mg/dL — ABNORMAL HIGH (ref 6–24)
Bilirubin Total: 0.5 mg/dL (ref 0.0–1.2)
CO2: 23 mmol/L (ref 20–29)
Calcium: 9.7 mg/dL (ref 8.7–10.2)
Chloride: 101 mmol/L (ref 96–106)
Creatinine, Ser: 1.15 mg/dL — ABNORMAL HIGH (ref 0.57–1.00)
Globulin, Total: 2.9 g/dL (ref 1.5–4.5)
Glucose: 137 mg/dL — ABNORMAL HIGH (ref 70–99)
Potassium: 4.8 mmol/L (ref 3.5–5.2)
Sodium: 140 mmol/L (ref 134–144)
Total Protein: 7.2 g/dL (ref 6.0–8.5)
eGFR: 57 mL/min/{1.73_m2} — ABNORMAL LOW (ref >59)

## 2023-01-25 LAB — CBC
Hematocrit: 41.4 % (ref 34.0–46.6)
Hemoglobin: 13.3 g/dL (ref 11.1–15.9)
MCH: 27 pg (ref 26.6–33.0)
MCHC: 32.1 g/dL (ref 31.5–35.7)
MCV: 84 fL (ref 79–97)
Platelets: 253 10*3/uL (ref 150–450)
RBC: 4.93 x10E6/uL (ref 3.77–5.28)
RDW: 14.7 % (ref 11.7–15.4)
WBC: 10.6 10*3/uL (ref 3.4–10.8)

## 2023-01-26 LAB — HGB A1C W/O EAG: Hgb A1c MFr Bld: 6 % — ABNORMAL HIGH (ref 4.8–5.6)

## 2023-01-26 LAB — SPECIMEN STATUS REPORT

## 2023-01-27 ENCOUNTER — Telehealth (HOSPITAL_COMMUNITY): Payer: Self-pay

## 2023-01-27 NOTE — Telephone Encounter (Signed)
Called to confirm/remind patient of their appointment [facility] on [facility].   Patient reminded to bring all medications and/or complete list.  Confirmed patient has transportation. Gave directions, instructed to utilize valet parking.  Confirmed appointment prior to ending call.

## 2023-01-30 ENCOUNTER — Ambulatory Visit (HOSPITAL_COMMUNITY)
Admission: RE | Admit: 2023-01-30 | Discharge: 2023-01-30 | Disposition: A | Discharge status: Home / Self Care | Payer: BLUE CROSS/BLUE SHIELD | Source: Ambulatory Visit | Attending: Family Medicine | Admitting: Family Medicine

## 2023-01-30 ENCOUNTER — Other Ambulatory Visit (HOSPITAL_COMMUNITY): Payer: Self-pay

## 2023-01-30 ENCOUNTER — Other Ambulatory Visit: Payer: Self-pay

## 2023-01-30 ENCOUNTER — Encounter (HOSPITAL_COMMUNITY): Payer: Self-pay

## 2023-01-30 VITALS — BP 180/98 | HR 79 | Wt 207.2 lb

## 2023-01-30 DIAGNOSIS — I3139 Other pericardial effusion (noninflammatory): Secondary | ICD-10-CM | POA: Diagnosis not present

## 2023-01-30 DIAGNOSIS — F17210 Nicotine dependence, cigarettes, uncomplicated: Secondary | ICD-10-CM | POA: Diagnosis not present

## 2023-01-30 DIAGNOSIS — I5022 Chronic systolic (congestive) heart failure: Secondary | ICD-10-CM | POA: Diagnosis not present

## 2023-01-30 DIAGNOSIS — Z79899 Other long term (current) drug therapy: Secondary | ICD-10-CM | POA: Diagnosis not present

## 2023-01-30 DIAGNOSIS — I272 Pulmonary hypertension, unspecified: Secondary | ICD-10-CM | POA: Diagnosis not present

## 2023-01-30 DIAGNOSIS — I428 Other cardiomyopathies: Secondary | ICD-10-CM | POA: Insufficient documentation

## 2023-01-30 DIAGNOSIS — I340 Nonrheumatic mitral (valve) insufficiency: Secondary | ICD-10-CM

## 2023-01-30 DIAGNOSIS — I2510 Atherosclerotic heart disease of native coronary artery without angina pectoris: Secondary | ICD-10-CM | POA: Insufficient documentation

## 2023-01-30 DIAGNOSIS — Z5986 Financial insecurity: Secondary | ICD-10-CM | POA: Diagnosis not present

## 2023-01-30 DIAGNOSIS — Z6832 Body mass index (BMI) 32.0-32.9, adult: Secondary | ICD-10-CM | POA: Diagnosis not present

## 2023-01-30 DIAGNOSIS — I110 Hypertensive heart disease with heart failure: Secondary | ICD-10-CM | POA: Diagnosis not present

## 2023-01-30 DIAGNOSIS — Z5982 Transportation insecurity: Secondary | ICD-10-CM | POA: Diagnosis not present

## 2023-01-30 DIAGNOSIS — R0683 Snoring: Secondary | ICD-10-CM | POA: Diagnosis not present

## 2023-01-30 DIAGNOSIS — E669 Obesity, unspecified: Secondary | ICD-10-CM | POA: Insufficient documentation

## 2023-01-30 DIAGNOSIS — Z8249 Family history of ischemic heart disease and other diseases of the circulatory system: Secondary | ICD-10-CM | POA: Insufficient documentation

## 2023-01-30 DIAGNOSIS — I10 Essential (primary) hypertension: Secondary | ICD-10-CM

## 2023-01-30 DIAGNOSIS — Z720 Tobacco use: Secondary | ICD-10-CM

## 2023-01-30 MED ORDER — FUROSEMIDE 20 MG PO TABS
20.0000 mg | ORAL_TABLET | ORAL | 6 refills | Status: DC | PRN
  Filled 2023-01-30: qty 30, fill #0
  Filled 2023-09-07: qty 30, 30d supply, fill #0

## 2023-01-30 MED ORDER — ENTRESTO 49-51 MG PO TABS
1.0000 | ORAL_TABLET | Freq: Two times a day (BID) | ORAL | 3 refills | Status: DC
  Filled 2023-01-30: qty 180, 90d supply, fill #0
  Filled 2023-09-07: qty 180, 90d supply, fill #1

## 2023-01-30 NOTE — Patient Instructions (Signed)
Thank you for coming in today  If you had labs drawn today, any labs that are abnormal the clinic will call you No news is good news  You have been given a prescription for a blood pressure cuff  Medications: RESTART Entresto 49/51 mg 1 tablet twice daily CHANGE Lasix to 20 mg as needed daily For weight gain of 3 lbs in 24 hours or 5 lbs in a week   Follow up appointments: Your physician recommends that you return for lab work in: 10 days BMET Lipids   Your physician recommends that you schedule a follow-up appointment in:  4 weeks with pharmacy    3-4 months with echocardiogram With Dr. Gala Romney You will receive a reminder letter in the mail a few months in advance. If you don't receive a letter, please call our office to schedule the follow-up appointment.  Your physician has requested that you have an echocardiogram. Echocardiography is a painless test that uses sound waves to create images of your heart. It provides your doctor with information about the size and shape of your heart and how well your heart's chambers and valves are working. This procedure takes approximately one hour. There are no restrictions for this procedure.       Do the following things EVERYDAY: Weigh yourself in the morning before breakfast. Write it down and keep it in a log. Take your medicines as prescribed Eat low salt foods--Limit salt (sodium) to 2000 mg per day.  Stay as active as you can everyday Limit all fluids for the day to less than 2 liters   At the [facility], you and your health needs are our priority. As part of our continuing mission to provide you with exceptional heart care, we have created designated Provider Care Teams. These Care Teams include your primary Cardiologist (physician) and Advanced Practice Providers (APPs- Physician Assistants and Nurse Practitioners) who all work together to provide you with the care you need, when you need it.   You may see any  of the following providers on your designated Care Team at your next follow up: Dr Arvilla Meres Dr Marca Ancona Dr. Marcos Eke, NP Robbie Lis, [place] [facility] [place], [place] Brynda Peon, NP Karle Plumber, PharmD   Please be sure to bring in all your medications bottles to every appointment.    Thank you for choosing [facility]  If you have any questions or concerns before your next appointment please send [place] a message through [place] or call our office at [phone].    TO LEAVE A MESSAGE FOR THE NURSE SELECT OPTION 2, PLEASE LEAVE A MESSAGE INCLUDING: YOUR NAME DATE OF BIRTH CALL BACK NUMBER REASON FOR CALL**this is important as we prioritize the call backs  YOU WILL RECEIVE A CALL BACK THE SAME DAY AS LONG AS YOU CALL BEFORE 4:00 PM

## 2023-01-30 NOTE — Progress Notes (Signed)
ReDS Vest / Clip - 01/30/23 1400       ReDS Vest / Clip   Station Marker C    Ruler Value 29    ReDS Value Range Low volume    ReDS Actual Value 30

## 2023-01-30 NOTE — Progress Notes (Signed)
[facility] Note   PCP: Donell Beers, FNP HF Cardiologist: Dr. Gala Romney  HPI: 52 y.o. AAF w/ h/o HTN and chronic systolic heart failure. In May 2016, she underwent left VATS w/ drainage of empyema/ abscess and decortication by Dr. Dorris Fetch. Echocardiogram showed normal EF 50-55%.   Admitted 8/16 w/ gastroenteritis/ sepsis and HTN urgency. Cardiology consulted for elevated troponin. Echo showed mildly reduced LVEF 40-45%, RV normal. LHC showed normal coronaries. Drop in EF felt to be stress induced   Presented to ED on 11/20/20 w/ CP and exertional dyspnea. She had stopped taking all of her BP meds BP 220/137. She was admitted and restarted on PO antihypertensives and IV Lasix. Echo EF, 20-25%, GIIIDD, no LVH, Global HK, RV mildly reduced, RVSP 60 mmHg, mod MR, Mod TR. cMRI showed severely reduced LVEF, moderately reduced RV, small pericardia effusion  Admitted 5/23 with CP and hypertensive emergency, in setting of medication non compliance. BP 198/131. Echo EF 20-25%, severe LV dysfunction with global HK, RV ok, circumferential pericardial effusion.   Echo 03/10/22 EF 55-60% G1DD   Admitted 7/24 with hypertensive urgency, BP 233/124, 2/2 to medication noncompliance. Underwent MRI brain for left sided numbness, showing no acute processes. UDS + opiates and THC. Given IV anti-hypertensives and home BP meds restarted. She was discharged home.  Today she returns for post hospital HF follow up. Overall feeling fair, main complaint is a HA with some blurred vision. She has SOB walking up steps. 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 Has leg swelling and feels palpitations when she lays flat. Denies CP, dizziness, edema, or PND/Orthopnea. Chronically sleeps on 3-4 pillows. Appetite ok. No fever or chills. Weight at home 207 pounds. Taking all medications. She snores. Smokes 1-2 cigs/day, no ETOH or drugs.  Cardiac Studies - Echo (8/23): EF 55-60%, G1DD  -  Echo (5/23): EF 20-25%, severely depressed LV with global HK, mildly LVH, RV ok, circumferential pericardial effusion, IV small.  - Echo (6/22): LVEF 20-25%, GIIIDD, no LVH, Global HK, RV mildly reduced, RVSP 60 mmHg, mod MR, Mod TR  - Echo (6/20): LVEF 40-45%, RV mildly reduced  - Echo (8/16): LVEF 40-45%, AK of apical myocardium, G2DD, RV normal  - Echo (5/16):  LVEF 50-55%, RV normal  - LHC (2016): normal coronaries   - R/LHC (6/16): Prox LAD lesion is 45% stenosed. Ao = 177/94 (126) LV = 176/18 RA = 5 RV = 64/9 PA = 64/17 (35) PCW = 18 Fick cardiac output/index = 5.0/2.8 PVR = 3.4 WU Ao sat = 97% PA sat = 68%, 69%   Assessment: 1. Mild CAD 2. Severe NICM likely due to HTN CM 3. 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 Left-sided pressures relatively well compemsated.   - Renal artery u/s (6/22): no RAS or FMD   - cMRI (6/22): Severely reduced LV systolic function with moderate LV dilation, LVEF 22%. Global hypokinesis. Moderately reduced RV systolic function with mild RV enlargement. RVEF 35%. Delayed myocardial enhancement in the superior and inferior RV insertion points, with extension to the midmyocardium in the antero- and inferoseptum. May represent increased pulmartery pressure. Small circumferential pericardial effusion. Findings most consistent with hypertensive heart disease and pulmonary hypertension.  ROS: All systems reviewed and negative except as per HPI.   Past Medical History:  Diagnosis Date   Cerumen impaction 01/2019   CHF (congestive heart failure) (HCC)    after last pregnancy   Daily headache    Heart  murmur    "born w/one":   History of hiatal hernia    Hyperlipidemia 01/2019   Hypertension    Hypertensive emergency 01/02/2019   Observed seizure-like activity (HCC) 01/02/2019   Pneumonia 11/2014   Seasonal allergies 01/2019   Vitamin D deficiency 01/2019   Current Outpatient Medications  Medication Sig Dispense Refill   amLODipine  (NORVASC) 5 MG tablet Take 1 tablet (5 mg total) by mouth daily. 90 tablet 1   atorvastatin (LIPITOR) 20 MG tablet Take 1 tablet (20 mg total) by mouth daily. 90 tablet 1   carvedilol (COREG) 12.5 MG tablet Take 1 tablet (12.5 mg total) by mouth 2 (two) times daily with a meal. 180 tablet 1   cyanocobalamin (VITAMIN B12) 1000 MCG tablet Take 1 tablet (1,000 mcg total) by mouth daily. 30 tablet 0   dapagliflozin propanediol (FARXIGA) 10 MG TABS tablet Take 1 tablet (10 mg total) by mouth daily. 90 tablet 3   furosemide (LASIX) 20 MG tablet Take 1 tablet (20 mg total) by mouth daily. 90 tablet 3   gabapentin (NEURONTIN) 300 MG capsule Take 1 capsule (300 mg total) by mouth 2 (two) times daily. 60 capsule 1   hydrALAZINE (APRESOLINE) 100 MG tablet Take 1 tablet (100 mg total) by mouth 3 (three) times daily. 270 tablet 3   isosorbide mononitrate (IMDUR) 30 MG 24 hr tablet Take 2 tablets (60 mg total) by mouth daily. 180 tablet 3   No current facility-administered medications for this encounter.   No Known Allergies  Social History   Socioeconomic History   Marital status: Divorced    Spouse name: Not on file   Number of children: 2   Years of education: Not on file   Highest education level: Not on file  Occupational History   Not on file  Tobacco Use   Smoking status: Every Day    Current packs/day: 0.00    Average packs/day: 0.3 packs/day for 10.0 years (3.3 ttl pk-yrs)    Types: Cigarettes    Start date: 11/10/2004    Last attempt to quit: 11/11/2014    Years since quitting: 8.2   Smokeless tobacco: Never  Substance and Sexual Activity   Alcohol use: Yes    Alcohol/week: 0.0 standard drinks of alcohol    Comment: 8/1/20176 "a couple beers a couple times/month"   Drug use: No   Sexual activity: Yes  Other Topics Concern   Not on file  Social History Narrative   Lives with her sister    Social Determinants of Health   Financial Resource Strain: High Risk (12/23/2020)   Overall  Financial Resource Strain (CARDIA)    Difficulty of Paying Living Expenses: Hard  Food Insecurity: Food Insecurity Present (01/12/2023)   Hunger Vital Sign    Worried About Running Out of Food in the Last Year: Sometimes true    Ran Out of Food in the Last Year: Sometimes true  Transportation Needs: Unmet Transportation Needs (01/12/2023)   PRAPARE - Administrator, Civil Service (Medical): Yes    Lack of Transportation (Non-Medical): Yes  Physical Activity: Not on file  Stress: Not on file  Social Connections: Not on file  Intimate Partner Violence: Not At Risk (01/12/2023)   Humiliation, Afraid, Rape, and Kick questionnaire    Fear of Current or Ex-Partner: No    Emotionally Abused: No    Physically Abused: No    Sexually Abused: No   Family History  Problem Relation Age of Onset  Diabetes Mellitus II Mother    Hypertension Mother    Kidney failure Mother    Lung cancer Father    Stroke Maternal Grandmother    LMP 12/31/2014   Wt Readings from Last 3 Encounters:  01/24/23 93.4 kg (205 lb 12.8 oz)  01/11/23 84.8 kg (187 lb)  03/10/22 85.5 kg (188 lb 6.4 oz)   PHYSICAL EXAM: General:  NAD. No resp difficulty, walked into clinic HEENT: Normal Neck: Supple. No JVD. Carotids 2+ bilat; no bruits. No lymphadenopathy or thryomegaly appreciated. Cor: PMI nondisplaced. Regular rate & rhythm. No rubs, gallops or murmurs. Lungs: Clear Abdomen: Soft, nontender, nondistended. No hepatosplenomegaly. No bruits or masses. Good bowel sounds. Extremities: No cyanosis, clubbing, rash, edema Neuro: Alert & oriented x 3, cranial nerves grossly intact. Moves all 4 extremities w/o difficulty. Affect pleasant.  ReDs: 30%  ECG (personally reviewed): NSR 86 bpm  ASSESSMENT & PLAN: Chronic Systolic Heart Failure with recovered EF - NICM.  - LHC in 2016 showed normal coronaries - Echo (5/16):  LVEF 50-55%, RV normal - Echo (6/22): EF 20-25%, RV mildly reduced, in the setting of  poorly controlled HTN/ hypertensive emergency  - L/RHC (6/22) w/ mild CAD. Suspect CM 2/2 HTN, RHC after diureses showed well compensated left sided filling pressures - cMRI (6/22): EF 22% severely reduced, RVEF 35% moderately down, small circumferential pericardial effusion, findings consistent with hypertensive heart disease and pulmonary HTN. - Echo (5/23) EF 20-25%, RV ok - Echo (8/23): EF 55-60% G1DD (recovered with good BP control) - NYHA II. Volume is good today. ReDs 30% - Restart Entresto 49/51 mg bid.  - Change Lasix to 20 mg PRN - Add spiro next - Continue hydralazine 100 mg tid + Imdur 60 mg daily. - Continue Farxiga 10 mg daily. - Continue Coreg 12.5 mg bid.  - Labs 01/24/23 reviewed, SCr 1.15, K 4.8. Repeat BMET in 10 days - Repeat echo when back on full GDMT and BP better controlled.   2. Severe HTN - BP uncontrolled. - Restart Entresto as above. - Continue amlodipine for now, consider stopping next visit in favor of increasing GDMT.   3. MR - Moderate on echo 6/22, likely functional in setting of dilated LV/RV. - Mild on most recent echo.   4. Pulmonary HTN - RVSP severely elevated on echo (6/22) 60 mmHg. - Suspect primarily 2/2 left sided heart disease/ acute CHF w/ elevated filling pressures. - RHC (6/22) w/ mild to moderate mixed pulmonary HTN. - No OSA on sleep study 9/22.   5. Tobacco abuse - Smokes 1-2 cigs/day - Discussed cessation.   6. CAD - LHC (6/22): LAD 45%. - No s/s angina - Continue statin.  - Check lipids at next lab draw  7. Hx of AKI - Previously off Entresto and spiro - Most recent SCr 1.15. - BMET in 10 days.  8. Obesity - Body mass index is 32.45 kg/m. - Not diabetic, may not be able to get GLP1RA  Follow up in 4 weeks with PharmD (add spiro and stop amlodipine if able) and 3 months with Dr. Gala Romney + echo  Jacklynn Ganong, [place] 01/30/23

## 2023-01-31 ENCOUNTER — Other Ambulatory Visit: Payer: Self-pay

## 2023-02-07 ENCOUNTER — Ambulatory Visit
Admission: RE | Admit: 2023-02-07 | Discharge: 2023-02-07 | Disposition: A | Discharge status: Home / Self Care | Payer: Medicaid Other | Source: Ambulatory Visit | Attending: Nurse Practitioner | Admitting: Nurse Practitioner

## 2023-02-07 DIAGNOSIS — Z1231 Encounter for screening mammogram for malignant neoplasm of breast: Secondary | ICD-10-CM

## 2023-02-09 ENCOUNTER — Other Ambulatory Visit (HOSPITAL_COMMUNITY): Payer: BLUE CROSS/BLUE SHIELD

## 2023-02-10 ENCOUNTER — Other Ambulatory Visit: Payer: Self-pay

## 2023-02-10 ENCOUNTER — Other Ambulatory Visit: Payer: Self-pay | Admitting: Nurse Practitioner

## 2023-02-10 ENCOUNTER — Ambulatory Visit (HOSPITAL_COMMUNITY)
Admission: RE | Admit: 2023-02-10 | Discharge: 2023-02-10 | Disposition: A | Discharge status: Home / Self Care | Payer: BLUE CROSS/BLUE SHIELD | Source: Ambulatory Visit | Attending: Cardiology | Admitting: Cardiology

## 2023-02-10 DIAGNOSIS — I5022 Chronic systolic (congestive) heart failure: Secondary | ICD-10-CM

## 2023-02-10 DIAGNOSIS — R928 Other abnormal and inconclusive findings on diagnostic imaging of breast: Secondary | ICD-10-CM

## 2023-02-10 LAB — BASIC METABOLIC PANEL
Anion gap: 7 (ref 5–15)
BUN: 20 mg/dL (ref 6–20)
CO2: 25 mmol/L (ref 22–32)
Calcium: 9.1 mg/dL (ref 8.9–10.3)
Chloride: 107 mmol/L (ref 98–111)
Creatinine, Ser: 1.17 mg/dL — ABNORMAL HIGH (ref 0.44–1.00)
GFR, Estimated: 56 mL/min — ABNORMAL LOW (ref >60)
Glucose, Bld: 123 mg/dL — ABNORMAL HIGH (ref 70–99)
Potassium: 4.2 mmol/L (ref 3.5–5.1)
Sodium: 139 mmol/L (ref 135–145)

## 2023-02-10 LAB — LIPID PANEL
Cholesterol: 144 mg/dL (ref 0–200)
HDL: 55 mg/dL (ref >40)
LDL Cholesterol: 75 mg/dL (ref 0–99)
Total CHOL/HDL Ratio: 2.6 RATIO
Triglycerides: 72 mg/dL (ref <150)
VLDL: 14 mg/dL (ref 0–40)

## 2023-02-13 ENCOUNTER — Other Ambulatory Visit: Payer: Self-pay

## 2023-02-16 ENCOUNTER — Other Ambulatory Visit: Payer: BLUE CROSS/BLUE SHIELD

## 2023-02-24 NOTE — Progress Notes (Incomplete)
***In Progress***    Advanced [facility] Failure Clinic Note  PCP: Donell Beers, FNP HF Cardiologist: Dr. Gala Romney HPI: 52 y.o. AAF w/ h/o HTN and chronic systolic heart failure. In May 2016, she underwent left VATS w/ drainage of empyema/ abscess and decortication by Dr. Dorris Fetch. Echocardiogram showed normal EF 50-55%.    Admitted 02/2015 w/ gastroenteritis/ sepsis and HTN urgency. Cardiology consulted for elevated troponin. Echo showed mildly reduced LVEF 40-45%, RV normal. LHC showed normal coronaries. Drop in EF felt to be stress induced.   Presented to ED on 11/20/20 w/ CP and exertional dyspnea. She had stopped taking all of her BP meds BP 220/137. She was admitted and restarted on PO antihypertensives and IV Lasix. Echo EF, 20-25%, GIIIDD, no LVH, Global HK, RV mildly reduced, RVSP 60 mmHg, mod MR, Mod TR. cMRI showed severely reduced LVEF, moderately reduced RV, small pericardia effusion   Admitted 11/2021 with CP and hypertensive emergency, in setting of medication non compliance. BP 198/131. Echo EF 20-25%, severe LV dysfunction with global HK, RV ok, circumferential pericardial effusion.    Echo 03/10/22 EF 55-60% G1DD    Admitted 01/2023 with hypertensive urgency, BP 233/124, 2/2 to medication noncompliance. Underwent MRI brain for left sided numbness, showing no acute processes. UDS + opiates and THC. Given IV anti-hypertensives and home BP meds restarted. She was discharged home.   At last visit on 01/30/23 she returned for post hospital HF follow up. Overall felt fair, main complaint is a HA with some blurred vision. She had SOB walking up steps. 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 Had leg swelling and feels palpitations when she lays flat. Denied CP, dizziness, edema, or PND/Orthopnea. Chronically sleeps on 3-4 pillows. Appetite ok. No fever or chills. Weight at home 207 pounds. Taking all medications. She snores. Smokes 1-2 cigs/day, no ETOH or drugs.  Today  she returns to HF clinic for pharmacist medication titration. At last visit with NP on 01/30/23 she was started on Entresto 49/51 mg bid and her Lasix was changed to 20 mg prn.  Overall feeling ***. Dizziness, lightheadedness, fatigue:  Chest pain or palpitations:  How is your breathing?: *** SOB: Able to complete all ADLs. Activity level ***  Weight at home pounds. Takes furosemide/torsemide/bumex *** mg *** daily.  LEE PND/Orthopnea  Appetite *** Low-salt diet:   Physical Exam Cost/affordability of meds     HF Medications: carvedilol 12.5 mg bid  Entresto 49/51 mg bid Farxiga 10 mg hydralazine 100 mg tid isosorbide 60 mg daily Lasix 20 mg prn  Has the patient been experiencing any side effects to the medications prescribed?  {YES NO:22349}  Does the patient have any problems obtaining medications due to transportation or finances?   {YES NO:22349} BCBS primary, Medicaid secondary Understanding of regimen: {excellent/good/fair/poor:19665} Understanding of indications: {excellent/good/fair/poor:19665} Potential of compliance: {excellent/good/fair/poor:19665} Patient understands to avoid NSAIDs. Patient understands to avoid decongestants.    Pertinent Lab Values: Labs 02/10/23: Serum creatinine 1.17, BUN 20, Potassium 4.2, Sodium 139, BNP    Vital Signs: Weight: *** (last clinic weight: 207 lbs) Blood pressure: ***  Heart rate: ***   Assessment/Plan: *** Chronic Systolic Heart Failure with recovered EF - NICM.  - LHC in 2016 showed normal coronaries - Echo (5/16):  LVEF 50-55%, RV normal - Echo (6/22): EF 20-25%, RV mildly reduced, in the setting of poorly controlled HTN/ hypertensive emergency  - L/RHC (6/22) w/ mild CAD. Suspect CM 2/2 HTN, RHC after diureses showed well  compensated left sided filling pressures - cMRI (6/22): EF 22% severely reduced, RVEF 35% moderately down, small circumferential pericardial effusion, findings consistent with hypertensive heart  disease and pulmonary HTN. - Echo (5/23) EF 20-25%, RV ok - Echo (8/23): EF 55-60% G1DD (recovered with good BP control) - NYHA II. Volume is good today. ReDs 30% - Restart Entresto 49/51 mg bid.  - Change Lasix to 20 mg PRN - Add spiro next - Continue hydralazine 100 mg tid + Imdur 60 mg daily. - Continue Farxiga 10 mg daily. - Continue Coreg 12.5 mg bid.  - Labs 01/24/23 reviewed, SCr 1.15, K 4.8. Repeat BMET in 10 days - Repeat echo when back on full GDMT and BP better controlled.   2. Severe HTN - BP uncontrolled. - Restart Entresto as above. - Continue amlodipine for now, consider stopping next visit in favor of increasing GDMT.   3. MR - Moderate on echo 6/22, likely functional in setting of dilated LV/RV. - Mild on most recent echo.   4. Pulmonary HTN - RVSP severely elevated on echo (6/22) 60 mmHg. - Suspect primarily 2/2 left sided heart disease/ acute CHF w/ elevated filling pressures. - RHC (6/22) w/ mild to moderate mixed pulmonary HTN. - No OSA on sleep study 9/22.   5. Tobacco abuse - Smokes 1-2 cigs/day - Discussed cessation.   6. CAD - LHC (6/22): LAD 45%. - No s/s angina - Continue statin.  - Check lipids at next lab draw   7. Hx of AKI - Previously off Entresto and spiro - Most recent SCr 1.15. - BMET in 10 days.   8. Obesity - Body mass index is 32.45 kg/m. - Not diabetic, may not be able to get GLP1RA  Follow up ***   Karle Plumber, PharmD, BCPS, BCCP, CPP Heart Failure Clinic Pharmacist [phone]

## 2023-02-28 ENCOUNTER — Ambulatory Visit
Admission: RE | Admit: 2023-02-28 | Discharge: 2023-02-28 | Disposition: A | Discharge status: Home / Self Care | Payer: BLUE CROSS/BLUE SHIELD | Source: Ambulatory Visit | Attending: Nurse Practitioner | Admitting: Nurse Practitioner

## 2023-02-28 ENCOUNTER — Ambulatory Visit
Admission: RE | Admit: 2023-02-28 | Discharge: 2023-02-28 | Disposition: A | Discharge status: Home / Self Care | Payer: Medicaid Other | Source: Ambulatory Visit | Attending: Nurse Practitioner

## 2023-02-28 ENCOUNTER — Inpatient Hospital Stay (HOSPITAL_COMMUNITY): Admission: RE | Admit: 2023-02-28 | Payer: BLUE CROSS/BLUE SHIELD | Source: Ambulatory Visit

## 2023-02-28 DIAGNOSIS — R928 Other abnormal and inconclusive findings on diagnostic imaging of breast: Secondary | ICD-10-CM

## 2023-03-01 ENCOUNTER — Other Ambulatory Visit: Payer: Self-pay | Admitting: Nurse Practitioner

## 2023-03-01 DIAGNOSIS — N6320 Unspecified lump in the left breast, unspecified quadrant: Secondary | ICD-10-CM

## 2023-03-03 ENCOUNTER — Telehealth: Payer: Self-pay

## 2023-03-03 NOTE — Progress Notes (Signed)
Patient attempted to be outreached by Erasmo Leventhal, PharmD Candidate to discuss hypertension. Left message to family member for patient to return our call at their convenience at [phone].

## 2023-03-07 ENCOUNTER — Ambulatory Visit: Payer: Self-pay | Admitting: Nurse Practitioner

## 2023-03-08 ENCOUNTER — Other Ambulatory Visit: Payer: Self-pay

## 2023-03-08 ENCOUNTER — Ambulatory Visit: Payer: BLUE CROSS/BLUE SHIELD | Admitting: Nurse Practitioner

## 2023-03-08 ENCOUNTER — Other Ambulatory Visit (HOSPITAL_COMMUNITY)
Admission: RE | Admit: 2023-03-08 | Discharge: 2023-03-08 | Disposition: A | Discharge status: Home / Self Care | Payer: BLUE CROSS/BLUE SHIELD | Source: Ambulatory Visit | Attending: Nurse Practitioner | Admitting: Nurse Practitioner

## 2023-03-08 ENCOUNTER — Encounter: Payer: Self-pay | Admitting: Nurse Practitioner

## 2023-03-08 VITALS — BP 140/71 | HR 69 | Resp 16 | Wt 200.0 lb

## 2023-03-08 DIAGNOSIS — H543 Unqualified visual loss, both eyes: Secondary | ICD-10-CM | POA: Diagnosis not present

## 2023-03-08 DIAGNOSIS — Z0000 Encounter for general adult medical examination without abnormal findings: Secondary | ICD-10-CM | POA: Diagnosis not present

## 2023-03-08 DIAGNOSIS — I10 Essential (primary) hypertension: Secondary | ICD-10-CM | POA: Diagnosis not present

## 2023-03-08 DIAGNOSIS — R7303 Prediabetes: Secondary | ICD-10-CM

## 2023-03-08 DIAGNOSIS — Z124 Encounter for screening for malignant neoplasm of cervix: Secondary | ICD-10-CM | POA: Diagnosis not present

## 2023-03-08 DIAGNOSIS — L089 Local infection of the skin and subcutaneous tissue, unspecified: Secondary | ICD-10-CM

## 2023-03-08 DIAGNOSIS — Z1211 Encounter for screening for malignant neoplasm of colon: Secondary | ICD-10-CM

## 2023-03-08 MED ORDER — CEPHALEXIN 500 MG PO CAPS
500.0000 mg | ORAL_CAPSULE | Freq: Four times a day (QID) | ORAL | 0 refills | Status: AC
Start: 2023-03-08 — End: 2023-03-21
  Filled 2023-03-08: qty 28, 7d supply, fill #0

## 2023-03-08 NOTE — Assessment & Plan Note (Signed)
Cervical Pap exam completed today Sample sent to the lab Procedure well-tolerated

## 2023-03-08 NOTE — Progress Notes (Signed)
.  Pt presents for annual physical exam w/pap -breast mammogram in July 2024 -painful blister present on left nipple no drainage  -needs eye dr referral

## 2023-03-08 NOTE — Progress Notes (Signed)
Complete physical exam  Patient: Emily Key   DOB: [birth year]   52 y.o. Female  MRN: [id number]  Subjective:    Chief Complaint  Patient presents with   Annual Exam    Emily Key is a 52 y.o. female  has a past medical history of Cerumen impaction (01/2019), CHF (congestive heart failure) (HCC), Daily headache, Heart murmur, History of hiatal hernia, Hyperlipidemia (01/2019), Hypertension, Hypertensive emergency (01/02/2019), Observed seizure-like activity (HCC) (01/02/2019), Pneumonia (11/2014), Seasonal allergies (01/2019), and Vitamin D deficiency (01/2019). who presents today for a complete physical exam. She reports consuming a low sodium diet.  Does walking exercises daily.  She generally feels well.   Patient stated that she quit smoking cigarettes 2 weeks ago.  Soft tissue infection .patient complains of a painful blister on her left nipple that she first noticed after waking up 2 days ago.  She denies fever, chills, malaise.  She is up-to-date with her mammogram which she had done 8 days ago.  Benign left breast mass 3 o'clock position was found with a recommendation for diagnostic mammogram and ultrasound in 6 months to reassess the probably benign left breast mass.   Patient encouraged to get her Tdap vaccine and shingles vaccine at the pharmacy  Follow-up with the GI specialist for screening mammogram provided  Hypertension.  Currently on amlodipine 5 mg daily, carvedilol 12.5 mg twice daily, hydralazine 100 mg 3 times daily, isosorbide mononitrate 30 mg daily, Entresto 49-51 mg 1 tablet daily.  Patient reports that his blood pressure has been elevated at home.  Stated that she has been taking all medications daily as ordered but has not take the afternoon dose of hydralazine.  She denies chest pain dizziness edema  She missed her last appointment with cardiology phone number to the cardiology office was provided she was encouraged to call the office and schedule an  appointment.     Most recent fall risk assessment:    01/24/2023    1:01 PM  Fall Risk   Falls in the past year? 0  Number falls in past yr: 0  Injury with Fall? 0  Risk for fall due to : No Fall Risks  Follow up Falls evaluation completed     Most recent depression screenings:    01/24/2023    1:01 PM 02/05/2021    9:22 AM  PHQ 2/9 Scores  PHQ - 2 Score 0 2  PHQ- 9 Score 5 13         Patient Care Team: Donell Beers, FNP as PCP - General (Nurse Practitioner) Gala Romney, Bevelyn Buckles, MD as PCP - Advanced Heart Failure (Cardiology) Bensimhon, Bevelyn Buckles, MD as PCP - Cardiology (Cardiology)   Outpatient Medications Prior to Visit  Medication Sig   amLODipine (NORVASC) 5 MG tablet Take 1 tablet (5 mg total) by mouth daily.   atorvastatin (LIPITOR) 20 MG tablet Take 1 tablet (20 mg total) by mouth daily.   carvedilol (COREG) 12.5 MG tablet Take 1 tablet (12.5 mg total) by mouth 2 (two) times daily with a meal.   dapagliflozin propanediol (FARXIGA) 10 MG TABS tablet Take 1 tablet (10 mg total) by mouth daily.   furosemide (LASIX) 20 MG tablet Take 1 tablet (20 mg total) by mouth as needed. For weight gain 3 lbs in 24 hours or 5 lbs in a week   gabapentin (NEURONTIN) 300 MG capsule Take 1 capsule (300 mg total) by mouth 2 (two) times daily.   hydrALAZINE (APRESOLINE) 100 MG tablet  Take 1 tablet (100 mg total) by mouth 3 (three) times daily.   isosorbide mononitrate (IMDUR) 30 MG 24 hr tablet Take 2 tablets (60 mg total) by mouth daily.   sacubitril-valsartan (ENTRESTO) 49-51 MG Take 1 tablet by mouth 2 (two) times daily.   No facility-administered medications prior to visit.    Review of Systems  Constitutional:  Negative for activity change, appetite change, chills, fatigue and fever.  HENT:  Negative for congestion, dental problem, ear discharge, ear pain, hearing loss, rhinorrhea, sinus pressure, sinus pain, sneezing and sore throat.   Eyes:  Negative for pain,  discharge, redness and itching.  Respiratory:  Negative for cough, chest tightness, shortness of breath and wheezing.   Cardiovascular:  Negative for chest pain, palpitations and leg swelling.  Gastrointestinal:  Negative for abdominal distention, abdominal pain, anal bleeding, blood in stool, constipation, diarrhea, nausea, rectal pain and vomiting.  Endocrine: Negative for cold intolerance, heat intolerance, polydipsia, polyphagia and polyuria.  Genitourinary:  Negative for difficulty urinating, dysuria, flank pain, frequency, hematuria, menstrual problem, pelvic pain and vaginal bleeding.  Musculoskeletal:  Negative for arthralgias, back pain, gait problem, joint swelling and myalgias.  Skin:  Positive for wound. Negative for color change, pallor and rash.  Allergic/Immunologic: Negative for environmental allergies, food allergies and immunocompromised state.  Neurological:  Negative for dizziness, tremors, facial asymmetry, weakness and headaches.  Hematological:  Negative for adenopathy. Does not bruise/bleed easily.  Psychiatric/Behavioral:  Negative for agitation, behavioral problems, confusion, decreased concentration, hallucinations, self-injury and suicidal ideas.        Objective:     BP (!) 140/71   Pulse 69   Resp 16   Wt 200 lb (90.7 kg)   LMP 12/31/2014   SpO2 100%   BMI 31.32 kg/m     Physical Exam Vitals and nursing note reviewed. Exam conducted with a chaperone present.  Constitutional:      General: She is not in acute distress.    Appearance: Normal appearance. She is obese. She is not ill-appearing, toxic-appearing or diaphoretic.  HENT:     Right Ear: Tympanic membrane, ear canal and external ear normal. There is no impacted cerumen.     Left Ear: Tympanic membrane, ear canal and external ear normal. There is no impacted cerumen.     Nose: Nose normal. No congestion or rhinorrhea.     Mouth/Throat:     Mouth: Mucous membranes are moist.     Pharynx:  Oropharynx is clear. No oropharyngeal exudate or posterior oropharyngeal erythema.  Eyes:     General: No scleral icterus.       Right eye: No discharge.        Left eye: No discharge.     Extraocular Movements: Extraocular movements intact.     Conjunctiva/sclera: Conjunctivae normal.  Neck:     Vascular: No carotid bruit.  Cardiovascular:     Rate and Rhythm: Normal rate and regular rhythm.     Pulses: Normal pulses.     Heart sounds: Normal heart sounds. No murmur heard.    No friction rub. No gallop.  Pulmonary:     Effort: Pulmonary effort is normal. No respiratory distress.     Breath sounds: Normal breath sounds. No stridor. No wheezing, rhonchi or rales.  Chest:     Chest wall: No mass, lacerations, deformity, swelling, tenderness or edema.  Breasts:    Tanner Score is 5.     Breasts are symmetrical.     Right: Normal. No swelling,  bleeding, inverted nipple, nipple discharge, skin change or tenderness.     Left: Swelling, skin change and tenderness present. No bleeding, inverted nipple or nipple discharge.     Comments: Left nipple has a  blister, site appears swollen and red.  Tender on palpation   Abdominal:     General: Bowel sounds are normal. There is no distension.     Palpations: Abdomen is soft. There is no mass.     Tenderness: There is no abdominal tenderness. There is no right CVA tenderness, left CVA tenderness, guarding or rebound.     Hernia: No hernia is present. There is no hernia in the left inguinal area or right inguinal area.  Genitourinary:    General: Normal vulva.     Exam position: Lithotomy position.     Pubic Area: No rash or pubic lice.      Tanner stage (genital): 5.     Labia:        Right: No rash, tenderness, lesion or injury.        Left: No rash, tenderness, lesion or injury.      Urethra: No prolapse, urethral pain, urethral swelling or urethral lesion.     Vagina: No signs of injury and foreign body. No vaginal discharge, erythema,  tenderness, bleeding, lesions or prolapsed vaginal walls.     Cervix: No cervical motion tenderness, discharge, friability, lesion, erythema, cervical bleeding or eversion.     Uterus: Normal. Not enlarged, not fixed, not tender and no uterine prolapse.      Adnexa:        Right: No mass, tenderness or fullness.         Left: No mass, tenderness or fullness.    Musculoskeletal:        General: No swelling, tenderness, deformity or signs of injury.     Cervical back: Normal range of motion and neck supple. No rigidity or tenderness.     Right lower leg: No edema.     Left lower leg: No edema.  Lymphadenopathy:     Cervical: No cervical adenopathy.     Lower Body: No right inguinal adenopathy. No left inguinal adenopathy.  Skin:    General: Skin is warm and dry.     Capillary Refill: Capillary refill takes less than 2 seconds.     Coloration: Skin is not jaundiced or pale.     Findings: No bruising, erythema, lesion or rash.  Neurological:     Mental Status: She is alert and oriented to person, place, and time.     Cranial Nerves: No cranial nerve deficit.     Sensory: No sensory deficit.     Motor: No weakness.     Coordination: Coordination normal.     Gait: Gait normal.     Deep Tendon Reflexes: Reflexes normal.  Psychiatric:        Mood and Affect: Mood normal.        Behavior: Behavior normal.        Thought Content: Thought content normal.        Judgment: Judgment normal.    No results found for any visits on 03/08/23.      Assessment & Plan:    Routine Health Maintenance and Physical Exam  Immunization History  Administered Date(s) Administered   Influenza,inj,Quad PF,6+ Mos 04/10/2015   Pneumococcal Polysaccharide-23 02/10/2015    Health Maintenance  Topic Date Due   DTaP/Tdap/Td (1 - Tdap) Never done   PAP SMEAR-Modifier  Never done  Colonoscopy  Never done   Zoster Vaccines- Shingrix (1 of 2) Never done   COVID-19 Vaccine (1 - 2023-24 season) Never done    INFLUENZA VACCINE  02/09/2023   MAMMOGRAM  02/06/2025   Hepatitis C Screening  Completed   HIV Screening  Completed   HPV VACCINES  Aged Out    Discussed health benefits of physical activity, and encouraged her to engage in regular exercise appropriate for her age and condition.  Problem List Items Addressed This Visit       Cardiovascular and Mediastinum   Hypertension (Chronic)    This SmartLink has not been configured with any valid records.   BP Readings from Last 3 Encounters:  03/08/23 (!) 149/82  01/30/23 (!) 180/98  01/24/23 (!) 176/80  Blood pressure is elevated but much improved ,she has not taken her afternoon dose of hydralazine  Currently on amlodipine 5 mg daily, carvedilol 12.5 mg twice daily, hydralazine 100 mg 3 times daily, isosorbide mononitrate 30 mg daily, Entresto 49-51 mg 1 tablet daily.  P Patient counseled on on DASH diet encouraged to engage in regular moderate exercises at least 150 minutes weekly Patient encouraged to reschedule her appointment with cardiology        Other   Screening for colon cancer    Was encouraged to follow-up with GI for screening colonoscopy      Relevant Orders   Ambulatory referral to Gastroenterology   Prediabetes    Lab Results  Component Value Date   HGBA1C 6.0 (H) 01/24/2023  Avoid sugar sweets soda      Screening for cervical cancer    Cervical Pap exam completed today Sample sent to the lab Procedure well-tolerated      Relevant Orders   Cytology - PAP([facility])   Soft tissue infection     - cephALEXin (KEFLEX) 500 MG capsule; Take 1 capsule (500 mg total) by mouth 4 (four) times daily for 7 days.  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  Counseling done include healthy lifestyle involving committing to 150 minutes of exercise per week, heart healthy diet, and attaining healthy weight. The importance of adequate sleep also discussed.  Regular use of seat belt and home safety were also discussed . Changes in health habits are decided on by patient with goals and time frames set for achieving them. Immunization and cancer screening  needs are specifically addressed at this visit.         Return in about 3 months (around 06/08/2023) for HTN.     Donell Beers, FNP

## 2023-03-08 NOTE — Assessment & Plan Note (Signed)
Annual exam as documented.  ?Counseling done include healthy lifestyle involving committing to 150 minutes of exercise per week, heart healthy diet, and attaining healthy weight. The importance of adequate sleep also discussed.  ?Regular use of seat belt and home safety were also discussed . ?Changes in health habits are decided on by patient with goals and time frames set for achieving them. ?Immunization and cancer screening  needs are specifically addressed at this visit.   ?

## 2023-03-08 NOTE — Patient Instructions (Addendum)
Please call cardiology on [phone] to reschedule your appointment  Call the gastroenterology off your [phone] for your colonoscopy  Please get your Tdap vaccine and shingles vaccine at a pharmacy  Please continue to monitor your blood pressure at home your blood pressure goal is less than 130/80   Nipple pain  - cephALEXin (KEFLEX) 500 MG capsule; Take 1 capsule (500 mg total) by mouth 4 (four) times daily for 7 days.  Dispense: 28 capsule; Refill: 0    It is important that you exercise regularly at least 30 minutes 5 times a week as tolerated  Think about what you will eat, plan ahead. Choose " clean, green, fresh or frozen" over canned, processed or packaged foods which are more sugary, salty and fatty. 70 to 75% of food eaten should be vegetables and fruit. Three meals at set times with snacks allowed between meals, but they must be fruit or vegetables. Aim to eat over a 12 hour period , example 7 am to 7 pm, and STOP after  your last meal of the day. Drink water,generally about 64 ounces per day, no other drink is as healthy. Fruit juice is best enjoyed in a healthy way, by EATING the fruit.  Thanks for choosing [facility] we consider it a privelige to serve you.

## 2023-03-08 NOTE — Assessment & Plan Note (Addendum)
This SmartLink has not been configured with any valid records.   BP Readings from Last 3 Encounters:  03/08/23 (!) 149/82  01/30/23 (!) 180/98  01/24/23 (!) 176/80  Blood pressure is elevated but much improved ,she has not taken her afternoon dose of hydralazine  Currently on amlodipine 5 mg daily, carvedilol 12.5 mg twice daily, hydralazine 100 mg 3 times daily, isosorbide mononitrate 30 mg daily, Entresto 49-51 mg 1 tablet daily.  P Patient counseled on on DASH diet encouraged to engage in regular moderate exercises at least 150 minutes weekly Patient encouraged to reschedule her appointment with cardiology

## 2023-03-08 NOTE — Assessment & Plan Note (Signed)
Was encouraged to follow-up with GI for screening colonoscopy

## 2023-03-08 NOTE — Assessment & Plan Note (Signed)
Lab Results  Component Value Date   HGBA1C 6.0 (H) 01/24/2023  Avoid sugar sweets soda

## 2023-03-08 NOTE — Assessment & Plan Note (Signed)
-   cephALEXin (KEFLEX) 500 MG capsule; Take 1 capsule (500 mg total) by mouth 4 (four) times daily for 7 days.  Dispense: 28 capsule; Refill: 0 Follow-up in the office after completion of Keflex if wound is not healed

## 2023-03-14 ENCOUNTER — Other Ambulatory Visit: Payer: Self-pay

## 2023-03-16 LAB — CYTOLOGY - PAP
Comment: NEGATIVE
Diagnosis: NEGATIVE
High risk HPV: NEGATIVE

## 2023-03-28 ENCOUNTER — Telehealth: Payer: Self-pay

## 2023-03-28 NOTE — Patient Outreach (Signed)
Care Guide Note  03/28/2023 Name: Christany Beller MRN: [id number] DOB: 05-05-1971  Referred by: Donell Beers, FNP Reason for referral : patient outreach (Outreach to schedule with RPH.)   Quaneesha Chesler is a 52 y.o. year old female who is a primary care patient of Donell Beers, FNP. Tauni Woodards was referred to the pharmacist for assistance related to HTN.    An unsuccessful telephone outreach was attempted today to contact the patient who was referred to the pharmacy team for assistance with HTN. Additional attempts will be made to contact the patient.   Baruch Gouty [facility] Assistant-Population Health [phone]

## 2023-06-13 ENCOUNTER — Ambulatory Visit: Payer: Self-pay | Admitting: Nurse Practitioner

## 2023-09-04 ENCOUNTER — Other Ambulatory Visit: Payer: BLUE CROSS/BLUE SHIELD

## 2023-09-07 ENCOUNTER — Other Ambulatory Visit: Payer: Self-pay

## 2023-09-11 ENCOUNTER — Other Ambulatory Visit: Payer: Self-pay

## 2023-09-25 ENCOUNTER — Other Ambulatory Visit: Payer: Self-pay

## 2023-11-23 ENCOUNTER — Emergency Department (HOSPITAL_COMMUNITY)
Admission: EM | Admit: 2023-11-23 | Discharge: 2023-11-24 | Disposition: A | Discharge status: Home / Self Care | Attending: Emergency Medicine | Admitting: Emergency Medicine

## 2023-11-23 ENCOUNTER — Other Ambulatory Visit: Payer: Self-pay

## 2023-11-23 ENCOUNTER — Encounter (HOSPITAL_COMMUNITY): Payer: Self-pay | Admitting: *Deleted

## 2023-11-23 ENCOUNTER — Emergency Department (HOSPITAL_COMMUNITY)

## 2023-11-23 DIAGNOSIS — R030 Elevated blood-pressure reading, without diagnosis of hypertension: Secondary | ICD-10-CM | POA: Diagnosis not present

## 2023-11-23 DIAGNOSIS — R519 Headache, unspecified: Secondary | ICD-10-CM | POA: Diagnosis present

## 2023-11-23 LAB — BASIC METABOLIC PANEL WITH GFR
Anion gap: 8 (ref 5–15)
BUN: 21 mg/dL — ABNORMAL HIGH (ref 6–20)
CO2: 22 mmol/L (ref 22–32)
Calcium: 9.3 mg/dL (ref 8.9–10.3)
Chloride: 108 mmol/L (ref 98–111)
Creatinine, Ser: 1.17 mg/dL — ABNORMAL HIGH (ref 0.44–1.00)
GFR, Estimated: 55 mL/min — ABNORMAL LOW (ref >60)
Glucose, Bld: 115 mg/dL — ABNORMAL HIGH (ref 70–99)
Potassium: 4.1 mmol/L (ref 3.5–5.1)
Sodium: 138 mmol/L (ref 135–145)

## 2023-11-23 LAB — CBC WITH DIFFERENTIAL/PLATELET
Abs Immature Granulocytes: 0.01 10*3/uL (ref 0.00–0.07)
Basophils Absolute: 0 10*3/uL (ref 0.0–0.1)
Basophils Relative: 1 %
Eosinophils Absolute: 0.1 10*3/uL (ref 0.0–0.5)
Eosinophils Relative: 2 %
HCT: 42.7 % (ref 36.0–46.0)
Hemoglobin: 13.5 g/dL (ref 12.0–15.0)
Immature Granulocytes: 0 %
Lymphocytes Relative: 39 %
Lymphs Abs: 2.6 10*3/uL (ref 0.7–4.0)
MCH: 27.2 pg (ref 26.0–34.0)
MCHC: 31.6 g/dL (ref 30.0–36.0)
MCV: 86.1 fL (ref 80.0–100.0)
Monocytes Absolute: 0.5 10*3/uL (ref 0.1–1.0)
Monocytes Relative: 7 %
Neutro Abs: 3.3 10*3/uL (ref 1.7–7.7)
Neutrophils Relative %: 51 %
Platelets: 273 10*3/uL (ref 150–400)
RBC: 4.96 MIL/uL (ref 3.87–5.11)
RDW: 14.1 % (ref 11.5–15.5)
WBC: 6.5 10*3/uL (ref 4.0–10.5)
nRBC: 0 % (ref 0.0–0.2)

## 2023-11-23 MED ORDER — ONDANSETRON 4 MG PO TBDP
4.0000 mg | ORAL_TABLET | Freq: Once | ORAL | Status: AC
  Administered 2023-11-23: 4 mg via ORAL
  Filled 2023-11-23: qty 1

## 2023-11-23 MED ORDER — OXYCODONE-ACETAMINOPHEN 5-325 MG PO TABS
2.0000 | ORAL_TABLET | Freq: Once | ORAL | Status: AC
  Administered 2023-11-23: 2 via ORAL
  Filled 2023-11-23: qty 2

## 2023-11-23 NOTE — ED Triage Notes (Signed)
 Pt is here for headache x one month.  She states that yesterday she was driving and didn't know where she was going and this occurred again this am.  She states that she was driving a familiar route and became confused.  No focal weakness.  Pt is alert and oriented at this time.

## 2023-11-23 NOTE — ED Provider Triage Note (Signed)
 Emergency Medicine Provider Triage Evaluation Note  Emily Key , a 53 y.o. female  was evaluated in triage.  Pt complains of headache x 1 month sometimes gets better with Tylenol  history of hypertension uncontrolled on her medications.  She had 2 episodes of momentary confusion over the last 2 days which brought her here.  Headache is left-sided she sometimes has vision changes and jaw problems on the left side.  Review of Systems  Positive: Bad headache Negative: Vomiting  Physical Exam  BP (!) 180/100 (BP Location: Right Arm)   Pulse 79   Temp 98.5 F (36.9 C)   Resp 20   LMP 12/31/2014   SpO2 100%  Gen:   Awake, no distress   Resp:  Normal effort  MSK:   Moves extremities without difficulty  Other:    Medical Decision Making  Medically screening exam initiated at 2:20 PM.  Appropriate orders placed.  Chantee Mummey was informed that the remainder of the evaluation will be completed by another provider, this initial triage assessment does not replace that evaluation, and the importance of remaining in the ED until their evaluation is complete.     Tama Fails, PA-C 11/23/23 1421

## 2023-11-24 MED ORDER — LABETALOL HCL 5 MG/ML IV SOLN
20.0000 mg | Freq: Once | INTRAVENOUS | Status: AC
  Administered 2023-11-24: 20 mg via INTRAVENOUS
  Filled 2023-11-24: qty 4

## 2023-11-24 MED ORDER — KETOROLAC TROMETHAMINE 30 MG/ML IJ SOLN
15.0000 mg | Freq: Once | INTRAMUSCULAR | Status: AC
Start: 2023-11-24 — End: 2023-11-24
  Administered 2023-11-24: 15 mg via INTRAVENOUS
  Filled 2023-11-24: qty 1

## 2023-11-24 MED ORDER — DIPHENHYDRAMINE HCL 50 MG/ML IJ SOLN
25.0000 mg | Freq: Once | INTRAMUSCULAR | Status: AC
  Administered 2023-11-24: 25 mg via INTRAVENOUS
  Filled 2023-11-24: qty 1

## 2023-11-24 MED ORDER — METOCLOPRAMIDE HCL 5 MG/ML IJ SOLN
10.0000 mg | Freq: Once | INTRAMUSCULAR | Status: AC
  Administered 2023-11-24: 10 mg via INTRAVENOUS
  Filled 2023-11-24: qty 2

## 2023-11-24 NOTE — Discharge Instructions (Signed)
 Please follow-up with your doctor and continue to work toward better blood pressure control.  If your symptoms change or worsen, please return to the ER.

## 2023-11-24 NOTE — ED Provider Notes (Signed)
 [facility] DEPT [facility] Emergency Department Provider Note MRN:  [id number]  Arrival date & time: 11/24/23     Chief Complaint   Headache   History of Present Illness   Emily Key is a 53 y.o. year-old female presents to the ED with chief complaint of headache.  She reports having headaches for the past 2 to 3 weeks.  She states that her blood pressure always runs high.  She reports that she has been taking her regular medications, but that they make her sick on her stomach.  She denies fevers or chills or neck stiffness.  Denies numbness, weakness, or tingling.  She states that the reason that she came in tonight was that she had confusion/memory loss this morning and yesterday morning.  She states that she was driving to get a family member and became lost while driving a very familiar route.  This happened 2 days in a row.  She denies any other new or associated symptoms.  History provided by patient.   Review of Systems  Pertinent positive and negative review of systems noted in HPI.    Physical Exam   Vitals:   11/24/23 0259 11/24/23 0305  BP: (!) 198/113 (!) 185/104  Pulse:  60  Resp:  14  Temp:  97.9 F (36.6 C)  SpO2:  100%    CONSTITUTIONAL:  non toxic-appearing, NAD NEURO:  Alert and oriented x 3, CN 3-12 grossly intact EYES:  eyes equal and reactive ENT/NECK:  Supple, no stridor  CARDIO:  normal rate, regular rhythm, appears well-perfused  PULM:  No respiratory distress, CTAB GI/GU:  non-distended,  MSK/SPINE:  No gross deformities, no edema, moves all extremities  SKIN:  no rash, atraumatic   *Additional and/or pertinent findings included in MDM below  Diagnostic and Interventional Summary    EKG Interpretation Date/Time:    Ventricular Rate:    PR Interval:    QRS Duration:    QT Interval:    QTC Calculation:   R Axis:      Text Interpretation:         Labs Reviewed  BASIC METABOLIC PANEL WITH GFR - Abnormal; Notable for the  following components:      Result Value   Glucose, Bld 115 (*)    BUN 21 (*)    Creatinine, Ser 1.17 (*)    GFR, Estimated 55 (*)    All other components within normal limits  CBC WITH DIFFERENTIAL/PLATELET    CT HEAD WO CONTRAST  Final Result      Medications  oxyCODONE -acetaminophen  (PERCOCET/ROXICET) 5-325 MG per tablet 2 tablet (2 tablets Oral Given 11/23/23 2122)  ondansetron  (ZOFRAN -ODT) disintegrating tablet 4 mg (4 mg Oral Given 11/23/23 2120)  ketorolac  (TORADOL ) 30 MG/ML injection 15 mg (15 mg Intravenous Given 11/24/23 0255)  metoCLOPramide  (REGLAN ) injection 10 mg (10 mg Intravenous Given 11/24/23 0256)  diphenhydrAMINE  (BENADRYL ) injection 25 mg (25 mg Intravenous Given 11/24/23 0256)  labetalol  (NORMODYNE ) injection 20 mg (20 mg Intravenous Given 11/24/23 0257)     Procedures  /  Critical Care Procedures  ED Course and Medical Decision Making  I have reviewed the triage vital signs, the nursing notes, and pertinent available records from the EMR.  Social Determinants Affecting Complexity of Care: Patient has no clinically significant social determinants affecting this chief complaint..   ED Course:    Medical Decision Making Patient here with headaches for the past 2 to 3 weeks.  She has had trouble taking her regular medications due  to nausea.  She has had significantly elevated blood pressure, which is chronic in nature.  She states that she had some moments of confusion yesterday and the day before in the morning while driving and ended up getting lost.  She states this is the main reason she came to the ER for evaluation.  All of her labs are about baseline.  CT shows remote lacunar infarcts.  No new evidence of stroke.  I discussed the case with Dr. Alecia Ames, from neurology, who states that if patient has had a parietal stroke, it should have been apparent on the CT at this point.  She states that her blood pressure could cause some intermittent encephalopathy.   Recommends working toward better blood pressure control.  Patient's blood pressures were in the 210s, and have improved to the 180s.  She states that her headache is much better.  Will plan for discharge home with PCP follow-up.  I have urged her to take her regular blood pressure medication.  I have advised her to follow-up with her primary care doctor and to continue working towards better blood pressure control.  Risk Prescription drug management.         Consultants: I consulted with Dr. Alecia Ames, who recommends working on blood pressure control.  Likely no need for additional workup tonight.   Treatment and Plan: I considered admission due to patient's initial presentation, but after considering the examination and diagnostic results, patient will not require admission and can be discharged with outpatient follow-up.    Final Clinical Impressions(s) / ED Diagnoses     ICD-10-CM   1. Nonintractable headache, unspecified chronicity pattern, unspecified headache type  R51.9     2. Elevated blood pressure reading  R03.0       ED Discharge Orders     None         Discharge Instructions Discussed with and Provided to Patient:     Discharge Instructions      Please follow-up with your doctor and continue to work toward better blood pressure control.  If your symptoms change or worsen, please return to the ER.     Sherel Dikes, PA-C 11/24/23 0321    Lindle Rhea, MD 11/25/23 [phone]

## 2023-11-28 ENCOUNTER — Telehealth: Payer: Self-pay

## 2023-11-28 NOTE — Transitions of Care (Post Inpatient/ED Visit) (Signed)
   11/28/2023  Name: Emily Key MRN: [id number] DOB: 11-07-1970  Today's TOC FU Call Status: Today's TOC FU Call Status:: Unsuccessful Call (1st Attempt) Unsuccessful Call (1st Attempt) Date: 11/24/23  Attempted to reach the patient regarding the most recent Inpatient/ED visit.  Follow Up Plan: Additional outreach attempts will be made to reach the patient to complete the Transitions of Care (Post Inpatient/ED visit) call.   Signature  [employer], [place]

## 2023-11-29 ENCOUNTER — Telehealth: Payer: Self-pay

## 2023-11-29 NOTE — Transitions of Care (Post Inpatient/ED Visit) (Signed)
   11/29/2023  Name: Emily Key MRN: [id number] DOB: [birth year]  Today's TOC FU Call Status: Today's TOC FU Call Status:: Unsuccessful Call (2nd Attempt) Unsuccessful Call (2nd Attempt) Date: 11/29/23  Attempted to reach the patient regarding the most recent Inpatient/ED visit.  Follow Up Plan: Additional outreach attempts will be made to reach the patient to complete the Transitions of Care (Post Inpatient/ED visit) call.   Signature  [employer], [place]

## 2023-12-05 ENCOUNTER — Telehealth: Payer: Self-pay

## 2023-12-05 NOTE — Transitions of Care (Post Inpatient/ED Visit) (Signed)
   12/05/2023  Name: Emily Key MRN: [id number] DOB: 11-22-1970  Today's TOC FU Call Status: Today's TOC FU Call Status:: Unsuccessful Call (3rd Attempt) Unsuccessful Call (3rd Attempt) Date: 12/05/23  Attempted to reach the patient regarding the most recent Inpatient/ED visit.  Follow Up Plan: No further outreach attempts will be made at this time. We have been unable to contact the patient.  Signature  [employer], [place]

## 2024-04-15 ENCOUNTER — Other Ambulatory Visit: Payer: Self-pay

## 2024-04-15 ENCOUNTER — Other Ambulatory Visit: Payer: Self-pay | Admitting: Nurse Practitioner

## 2024-04-15 ENCOUNTER — Other Ambulatory Visit (HOSPITAL_COMMUNITY): Payer: Self-pay | Admitting: Family Medicine

## 2024-04-15 DIAGNOSIS — I429 Cardiomyopathy, unspecified: Secondary | ICD-10-CM

## 2024-04-15 DIAGNOSIS — I160 Hypertensive urgency: Secondary | ICD-10-CM

## 2024-04-15 DIAGNOSIS — I5042 Chronic combined systolic (congestive) and diastolic (congestive) heart failure: Secondary | ICD-10-CM

## 2024-04-15 MED ORDER — AMLODIPINE BESYLATE 5 MG PO TABS
5.0000 mg | ORAL_TABLET | Freq: Every day | ORAL | 1 refills | Status: DC
  Filled 2024-04-15: qty 90, 90d supply, fill #0

## 2024-04-15 MED ORDER — ATORVASTATIN CALCIUM 20 MG PO TABS
20.0000 mg | ORAL_TABLET | Freq: Every day | ORAL | 1 refills | Status: AC
  Filled 2024-04-15: qty 90, 90d supply, fill #0

## 2024-04-15 MED ORDER — CARVEDILOL 12.5 MG PO TABS
12.5000 mg | ORAL_TABLET | Freq: Two times a day (BID) | ORAL | 1 refills | Status: AC
  Filled 2024-04-15: qty 180, 90d supply, fill #0

## 2024-04-15 MED ORDER — HYDRALAZINE HCL 100 MG PO TABS
100.0000 mg | ORAL_TABLET | Freq: Three times a day (TID) | ORAL | 3 refills | Status: AC
  Filled 2024-04-15: qty 270, 90d supply, fill #0

## 2024-04-15 MED ORDER — DAPAGLIFLOZIN PROPANEDIOL 10 MG PO TABS
10.0000 mg | ORAL_TABLET | Freq: Every day | ORAL | 3 refills | Status: AC
  Filled 2024-04-15: qty 90, 90d supply, fill #0

## 2024-04-15 MED ORDER — FUROSEMIDE 20 MG PO TABS
20.0000 mg | ORAL_TABLET | ORAL | 1 refills | Status: AC | PRN
  Filled 2024-04-15: qty 30, 30d supply, fill #0

## 2024-04-15 MED ORDER — ISOSORBIDE MONONITRATE ER 30 MG PO TB24
60.0000 mg | ORAL_TABLET | Freq: Every day | ORAL | 3 refills | Status: AC
Start: 2024-04-15 — End: ?
  Filled 2024-04-15: qty 180, 90d supply, fill #0

## 2024-04-15 MED ORDER — SACUBITRIL-VALSARTAN 49-51 MG PO TABS
1.0000 | ORAL_TABLET | Freq: Two times a day (BID) | ORAL | 0 refills | Status: DC
  Filled 2024-04-15: qty 60, 30d supply, fill #0

## 2024-04-22 ENCOUNTER — Other Ambulatory Visit: Payer: Self-pay

## 2024-04-25 IMAGING — DX DG CHEST 1V PORT
1 series · 1 of 1 positions shown · non-contrast
Comparison: Chest x-ray dated November 19, 2021

CLINICAL DATA: Chest pain

EXAM:
PORTABLE CHEST 1 VIEW

[chest]
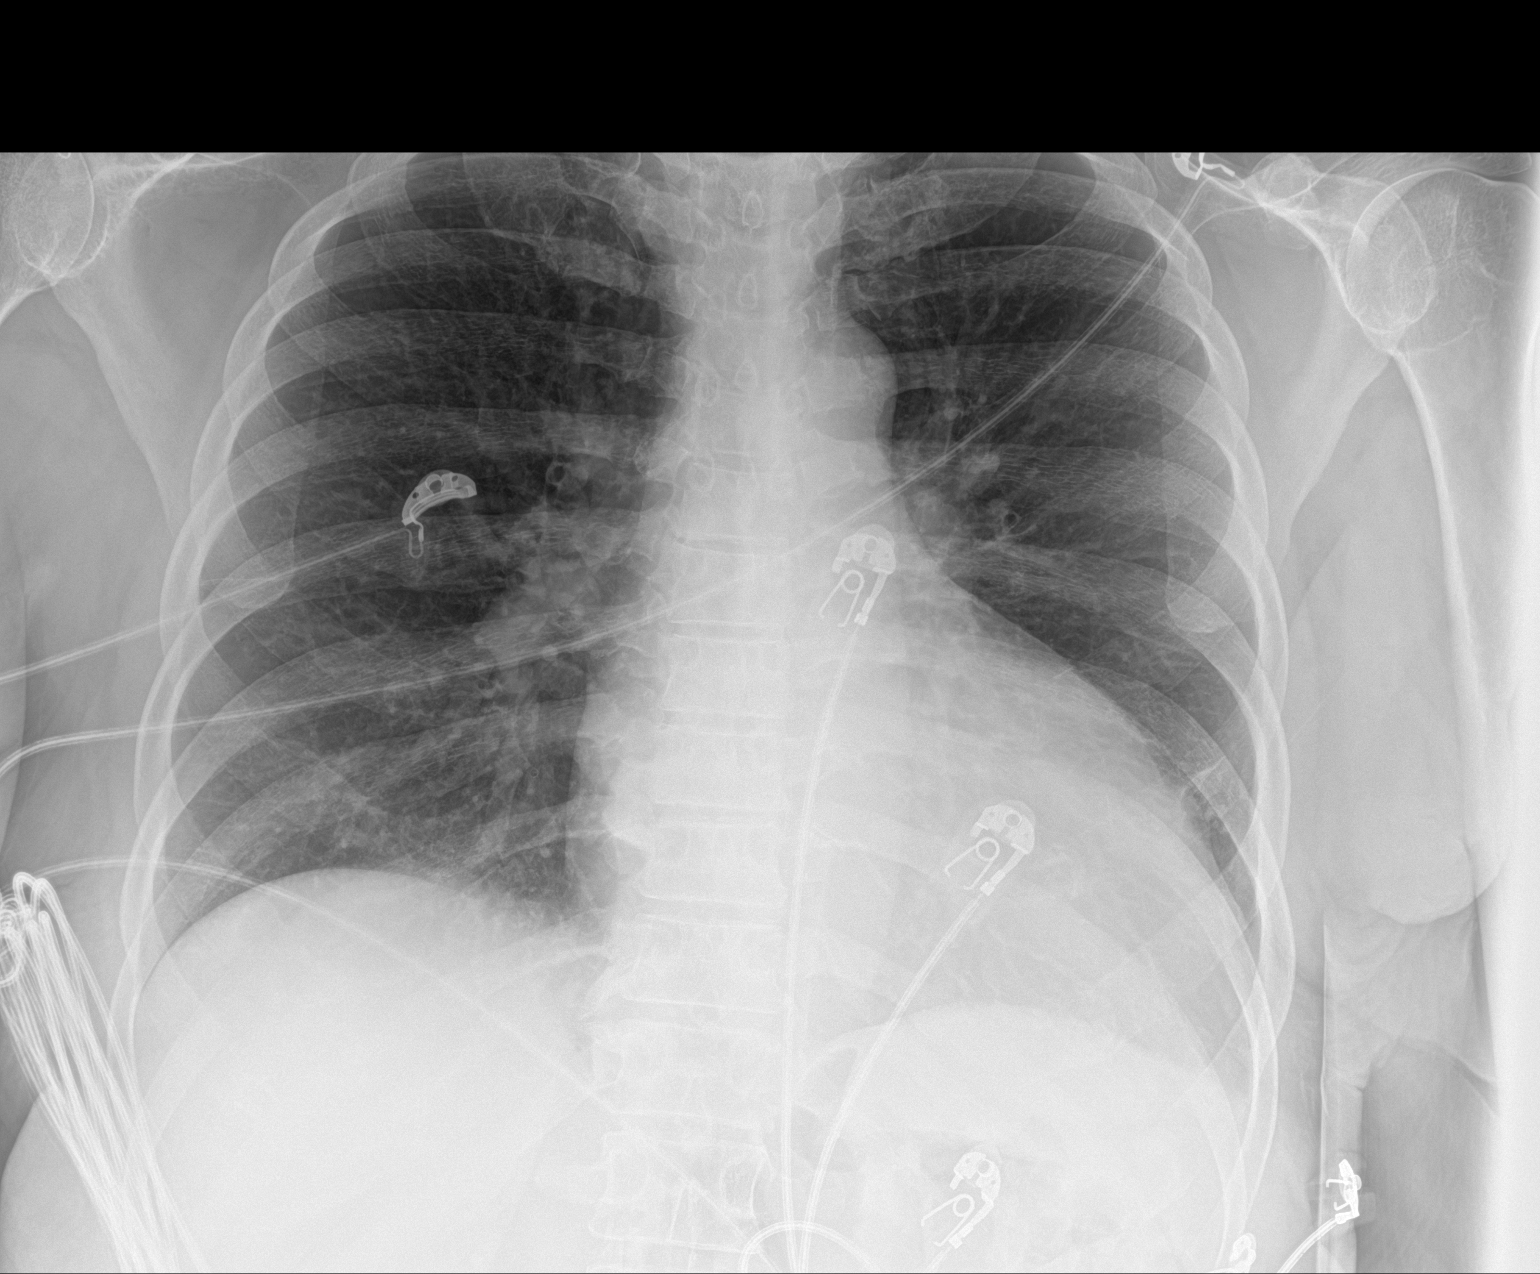

[1 of 1 positions shown; findings below may reference images not displayed]

FINDINGS: Cardiac contours are upper limits of normal in size, unchanged when
compared to prior. Mild left basilar atelectasis, lungs otherwise
clear. The visualized skeletal structures are unremarkable.
IMPRESSION: No acute cardiopulmonary abnormality.

## 2024-05-27 ENCOUNTER — Other Ambulatory Visit: Payer: Self-pay

## 2024-05-27 ENCOUNTER — Observation Stay (HOSPITAL_COMMUNITY)
Admission: EM | Admit: 2024-05-27 | Discharge: 2024-05-29 | Disposition: A | Discharge status: Home / Self Care | Attending: Emergency Medicine | Admitting: Emergency Medicine

## 2024-05-27 ENCOUNTER — Emergency Department (HOSPITAL_COMMUNITY)

## 2024-05-27 ENCOUNTER — Encounter (HOSPITAL_COMMUNITY): Payer: Self-pay

## 2024-05-27 DIAGNOSIS — I739 Peripheral vascular disease, unspecified: Secondary | ICD-10-CM | POA: Insufficient documentation

## 2024-05-27 DIAGNOSIS — I130 Hypertensive heart and chronic kidney disease with heart failure and stage 1 through stage 4 chronic kidney disease, or unspecified chronic kidney disease: Secondary | ICD-10-CM | POA: Insufficient documentation

## 2024-05-27 DIAGNOSIS — Z79899 Other long term (current) drug therapy: Secondary | ICD-10-CM | POA: Diagnosis not present

## 2024-05-27 DIAGNOSIS — Z7982 Long term (current) use of aspirin: Secondary | ICD-10-CM | POA: Insufficient documentation

## 2024-05-27 DIAGNOSIS — Z87891 Personal history of nicotine dependence: Secondary | ICD-10-CM | POA: Insufficient documentation

## 2024-05-27 DIAGNOSIS — Z91148 Patient's other noncompliance with medication regimen for other reason: Secondary | ICD-10-CM | POA: Insufficient documentation

## 2024-05-27 DIAGNOSIS — E669 Obesity, unspecified: Secondary | ICD-10-CM | POA: Diagnosis not present

## 2024-05-27 DIAGNOSIS — R7303 Prediabetes: Secondary | ICD-10-CM | POA: Insufficient documentation

## 2024-05-27 DIAGNOSIS — N189 Chronic kidney disease, unspecified: Secondary | ICD-10-CM | POA: Insufficient documentation

## 2024-05-27 DIAGNOSIS — Z6830 Body mass index (BMI) 30.0-30.9, adult: Secondary | ICD-10-CM | POA: Diagnosis not present

## 2024-05-27 DIAGNOSIS — R079 Chest pain, unspecified: Secondary | ICD-10-CM | POA: Diagnosis present

## 2024-05-27 DIAGNOSIS — I5042 Chronic combined systolic (congestive) and diastolic (congestive) heart failure: Secondary | ICD-10-CM | POA: Diagnosis not present

## 2024-05-27 DIAGNOSIS — R0789 Other chest pain: Principal | ICD-10-CM | POA: Insufficient documentation

## 2024-05-27 DIAGNOSIS — I428 Other cardiomyopathies: Secondary | ICD-10-CM | POA: Diagnosis not present

## 2024-05-27 DIAGNOSIS — I2510 Atherosclerotic heart disease of native coronary artery without angina pectoris: Secondary | ICD-10-CM | POA: Insufficient documentation

## 2024-05-27 LAB — COMPREHENSIVE METABOLIC PANEL WITH GFR
ALT: 14 U/L (ref 0–44)
AST: 21 U/L (ref 15–41)
Albumin: 3.8 g/dL (ref 3.5–5.0)
Alkaline Phosphatase: 55 U/L (ref 38–126)
Anion gap: 11 (ref 5–15)
BUN: 13 mg/dL (ref 6–20)
CO2: 22 mmol/L (ref 22–32)
Calcium: 9.1 mg/dL (ref 8.9–10.3)
Chloride: 103 mmol/L (ref 98–111)
Creatinine, Ser: 0.91 mg/dL (ref 0.44–1.00)
GFR, Estimated: >60 mL/min (ref >60)
Glucose, Bld: 143 mg/dL — ABNORMAL HIGH (ref 70–99)
Potassium: 3.9 mmol/L (ref 3.5–5.1)
Sodium: 136 mmol/L (ref 135–145)
Total Bilirubin: 0.6 mg/dL (ref 0.0–1.2)
Total Protein: 7.4 g/dL (ref 6.5–8.1)

## 2024-05-27 LAB — URINALYSIS, MICROSCOPIC (REFLEX): Bacteria, UA: NONE SEEN

## 2024-05-27 LAB — CBC WITH DIFFERENTIAL/PLATELET
Abs Immature Granulocytes: 0.04 K/uL (ref 0.00–0.07)
Basophils Absolute: 0 K/uL (ref 0.0–0.1)
Basophils Relative: 0 %
Eosinophils Absolute: 0.2 K/uL (ref 0.0–0.5)
Eosinophils Relative: 2 %
HCT: 41.5 % (ref 36.0–46.0)
Hemoglobin: 13.1 g/dL (ref 12.0–15.0)
Immature Granulocytes: 0 %
Lymphocytes Relative: 16 %
Lymphs Abs: 1.6 K/uL (ref 0.7–4.0)
MCH: 26.6 pg (ref 26.0–34.0)
MCHC: 31.6 g/dL (ref 30.0–36.0)
MCV: 84.2 fL (ref 80.0–100.0)
Monocytes Absolute: 0.4 K/uL (ref 0.1–1.0)
Monocytes Relative: 4 %
Neutro Abs: 7.8 K/uL — ABNORMAL HIGH (ref 1.7–7.7)
Neutrophils Relative %: 78 %
Platelets: 259 K/uL (ref 150–400)
RBC: 4.93 MIL/uL (ref 3.87–5.11)
RDW: 13.9 % (ref 11.5–15.5)
WBC: 10.1 K/uL (ref 4.0–10.5)
nRBC: 0 % (ref 0.0–0.2)

## 2024-05-27 LAB — URINALYSIS, ROUTINE W REFLEX MICROSCOPIC
Bilirubin Urine: NEGATIVE
Glucose, UA: >=500 mg/dL — AB
Ketones, ur: NEGATIVE mg/dL
Leukocytes,Ua: NEGATIVE
Nitrite: NEGATIVE
Protein, ur: NEGATIVE mg/dL
Specific Gravity, Urine: 1.01 (ref 1.005–1.030)
pH: 7 (ref 5.0–8.0)

## 2024-05-27 LAB — LIPASE, BLOOD: Lipase: 23 U/L (ref 11–51)

## 2024-05-27 LAB — TROPONIN I (HIGH SENSITIVITY)
Troponin I (High Sensitivity): 8 ng/L (ref <18)
Troponin I (High Sensitivity): 8 ng/L (ref <18)

## 2024-05-27 MED ORDER — LACTATED RINGERS IV SOLN: INTRAVENOUS | Status: AC

## 2024-05-27 MED ORDER — KETOROLAC TROMETHAMINE 15 MG/ML IJ SOLN
15.0000 mg | Freq: Once | INTRAMUSCULAR | Status: AC
  Administered 2024-05-27: 15 mg via INTRAVENOUS
  Filled 2024-05-27: qty 1

## 2024-05-27 MED ORDER — OXYCODONE HCL 5 MG PO TABS
5.0000 mg | ORAL_TABLET | Freq: Four times a day (QID) | ORAL | Status: DC | PRN
  Administered 2024-05-28: 5 mg via ORAL
  Administered 2024-05-29: 10 mg via ORAL
  Filled 2024-05-27: qty 2
  Filled 2024-05-27: qty 1

## 2024-05-27 MED ORDER — IOHEXOL 350 MG/ML SOLN
100.0000 mL | Freq: Once | INTRAVENOUS | Status: AC | PRN
  Administered 2024-05-27: 100 mL via INTRAVENOUS

## 2024-05-27 MED ORDER — PROCHLORPERAZINE EDISYLATE 10 MG/2ML IJ SOLN: 5.0000 mg | Freq: Four times a day (QID) | INTRAMUSCULAR | Status: DC | PRN

## 2024-05-27 MED ORDER — ACETAMINOPHEN 500 MG PO TABS
500.0000 mg | ORAL_TABLET | Freq: Four times a day (QID) | ORAL | Status: DC | PRN
  Administered 2024-05-28 (×3): 500 mg via ORAL
  Filled 2024-05-27 (×3): qty 1

## 2024-05-27 MED ORDER — MORPHINE SULFATE (PF) 4 MG/ML IV SOLN
4.0000 mg | Freq: Once | INTRAVENOUS | Status: AC
  Administered 2024-05-27: 4 mg via INTRAVENOUS
  Filled 2024-05-27: qty 1

## 2024-05-27 MED ORDER — HYDRALAZINE HCL 20 MG/ML IJ SOLN
10.0000 mg | Freq: Once | INTRAMUSCULAR | Status: AC
  Administered 2024-05-27: 10 mg via INTRAVENOUS
  Filled 2024-05-27: qty 1

## 2024-05-27 MED ORDER — ATORVASTATIN CALCIUM 10 MG PO TABS
20.0000 mg | ORAL_TABLET | Freq: Every day | ORAL | Status: DC
  Administered 2024-05-27 – 2024-05-29 (×3): 20 mg via ORAL
  Filled 2024-05-27 (×3): qty 2

## 2024-05-27 MED ORDER — MORPHINE SULFATE (PF) 4 MG/ML IV SOLN
4.0000 mg | INTRAVENOUS | Status: DC | PRN
Start: 2024-05-27 — End: 2024-05-29
  Administered 2024-05-29 (×2): 4 mg via INTRAVENOUS
  Filled 2024-05-27 (×3): qty 1

## 2024-05-27 MED ORDER — ASPIRIN 325 MG PO TABS
325.0000 mg | ORAL_TABLET | Freq: Once | ORAL | Status: AC
  Administered 2024-05-27: 325 mg via ORAL
  Filled 2024-05-27: qty 1

## 2024-05-27 MED ORDER — MELATONIN 5 MG PO TABS
5.0000 mg | ORAL_TABLET | Freq: Every evening | ORAL | Status: DC | PRN
Start: 2024-05-27 — End: 2024-05-29

## 2024-05-27 MED ORDER — POLYETHYLENE GLYCOL 3350 17 G PO PACK: 17.0000 g | PACK | Freq: Every day | ORAL | Status: DC | PRN

## 2024-05-27 MED ORDER — LACTATED RINGERS IV SOLN: INTRAVENOUS | Status: DC

## 2024-05-27 MED ORDER — ENOXAPARIN SODIUM 40 MG/0.4ML IJ SOSY
40.0000 mg | PREFILLED_SYRINGE | INTRAMUSCULAR | Status: DC
  Administered 2024-05-27 – 2024-05-28 (×2): 40 mg via SUBCUTANEOUS
  Filled 2024-05-27 (×2): qty 0.4

## 2024-05-27 MED ORDER — ONDANSETRON HCL 4 MG/2ML IJ SOLN
4.0000 mg | Freq: Once | INTRAMUSCULAR | Status: AC
  Administered 2024-05-27: 4 mg via INTRAVENOUS
  Filled 2024-05-27: qty 2

## 2024-05-27 MED ORDER — ASPIRIN 81 MG PO TBEC
81.0000 mg | DELAYED_RELEASE_TABLET | Freq: Every day | ORAL | Status: DC
  Administered 2024-05-28 – 2024-05-29 (×2): 81 mg via ORAL
  Filled 2024-05-27 (×2): qty 1

## 2024-05-27 MED ORDER — CARVEDILOL 3.125 MG PO TABS
6.2500 mg | ORAL_TABLET | Freq: Two times a day (BID) | ORAL | Status: DC
  Administered 2024-05-27 – 2024-05-29 (×4): 6.25 mg via ORAL
  Filled 2024-05-27 (×4): qty 2

## 2024-05-27 NOTE — ED Triage Notes (Signed)
 Pt came in via POV d/t being very nauseous while driving to work & then her Lt chest pain started with radiation to Lt arm, neck & shoulder. Was also diaphoretic & having some SOB, A/Ox4, rates her pain 8/10 during triage.

## 2024-05-27 NOTE — ED Provider Triage Note (Signed)
 Emergency Medicine Provider Triage Evaluation Note  Emily Key , a 53 y.o. female  was evaluated in triage.  Pt complains of L sided chest pain radiating down L arm, neck and L shoulder started today while driving accompanied nausea and shortness of breath. Worse on exertion. Feels similar to when she had a previous MI.     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   Endorses headache with bilateral blurry vision, pulsatile over forehead with light sensitivity. Endorsing dry cough, LUQ abdominal pain.   Denies fever, congestion, vomiting, diarrhea, dysuria, LE swelling.   Review of Systems  Positive: N/a Negative: N/a  Physical Exam  BP (!) 162/87 (BP Location: Right Arm)   Pulse 67   Temp 97.6 F (36.4 C)   Resp 18   Ht 5' 8 (1.727 m)   Wt 90.7 kg   LMP 12/31/2014   SpO2 98%   BMI 30.41 kg/m  Gen:   Awake, no distress   Resp:  Normal effort  MSK:   Moves extremities without difficulty  Other:    Medical Decision Making  Medically screening exam initiated at 11:34 AM.  Appropriate orders placed.  Emily Key was informed that the remainder of the evaluation will be completed by another provider, this initial triage assessment does not replace that evaluation, and the importance of remaining in the ED until their evaluation is complete.     Beola Terrall RAMAN, [place] 05/27/24 1139

## 2024-05-27 NOTE — H&P (Signed)
 History and Physical  Emily Key FMW:[id number] DOB: 1971-03-27 DOA: 05/27/2024  Referring physician: Rosan Handler, PA-EDP  PCP: Paseda, Folashade R, FNP  Outpatient Specialists: Cardiology. Patient coming from: Home.  Chief Complaint: Chest pain.  HPI: Emily Key is a 53 y.o. 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  Associated with nausea, dyspnea, and diaphoresis.  The pain is similar to previous chest pain that led to NSTEMI.  She stopped taking her baby aspirin , months ago.  Was brought into the ER via private vehicle.  In the ER alert and oriented x 4.  Rates her chest pain 8 out of 10.  Twelve-lead EKG on presentation showed no evidence of acute ischemia.  High-sensitivity troponin negative x 2.  The patient received IV morphine  4 mg x 2 and IV Toradol  15 mg x 1.    EDP discussed the case with cardiology, Dr. Pietro, who recommended admission by the medicine service.  Cardiology will see in consultation.    Admitted by [facility], hospitalist service.  The patient received a full dose aspirin  325 mg x 1.  Her chest pain is persistent, now 7 out of 10.    CT angio chest abdomen pelvis dissection with and without contrast ordered by EDP revealed no evidence of acute aortic syndrome.  No aortic aneurysm or dissection.  Stable mild cardiomegaly with biventricular enlargement and trace pericardial effusion.  Enlarged central pulmonary arteries consistent with pulmonary arterial hypertension.  No pulmonary embolism.  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  Distal branch vessels are patent without aneurysm or dissection.  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  Distally widely patent.  ED Course: Temperature 97.4.  BP 174/99, pulse 68, respiratory rate 14, O2 saturation 95% on room air.  Review of Systems: Review of systems as noted in the HPI. All other systems reviewed and are negative.   Past Medical History:  Diagnosis Date   Cerumen impaction 01/2019   CHF (congestive heart failure) (HCC)    after last pregnancy   Daily headache    Heart murmur    born w/one:   History of hiatal hernia    Hyperlipidemia 01/2019   Hypertension    Hypertensive emergency 01/02/2019   Observed seizure-like activity (HCC) 01/02/2019   Pneumonia 11/2014   Seasonal allergies 01/2019   Vitamin D  deficiency 01/2019   Past Surgical History:  Procedure Laterality Date   CARDIAC CATHETERIZATION N/A 02/13/2015   Procedure: Left Heart Cath and Coronary Angiography;  Surgeon: Debby DELENA Sor, MD;  Location: [facility] INVASIVE CV LAB;  Service: Cardiovascular;  Laterality: N/A;   PLEURAL EFFUSION DRAINAGE Left 11/14/2014   Procedure: DRAINAGE OF LEFT PLEURAL EFFUSION;  Surgeon: Elspeth JAYSON Millers, MD;  Location: [facility] OR;  Service: Thoracic;  Laterality: Left;   RIGHT/LEFT HEART CATH AND CORONARY ANGIOGRAPHY N/A 12/24/2020   Procedure: RIGHT/LEFT HEART CATH AND CORONARY ANGIOGRAPHY;  Surgeon: Cherrie Toribio SAUNDERS, MD;  Location: [facility] INVASIVE CV LAB;  Service: Cardiovascular;  Laterality: N/A;   TONSILLECTOMY  AND ADENOIDECTOMY  ~ 1986   TUBAL LIGATION  2000   VIDEO ASSISTED THORACOSCOPY (VATS)/DECORTICATION Left 11/14/2014   Procedure: LEFT VIDEO ASSISTED THORACOSCOPY (VATS)/DECORTICATION;  Surgeon: Elspeth JAYSON Millers, MD;  Location: [facility] OR;  Service: Thoracic;  Laterality: Left;    Social History:  reports  that she quit smoking about 9 years ago. Her smoking use included cigarettes. She started smoking about 19 years ago. She has a 3.3 pack-year smoking history. She has never used smokeless tobacco. She reports current alcohol use. She reports that she does not use drugs.   No Known Allergies  Family History  Problem Relation Age of Onset   Diabetes Mellitus II Mother    Hypertension Mother    Kidney failure Mother    Lung cancer Father    Stroke Maternal Grandmother       Prior to Admission medications   Medication Sig Start Date End Date Taking? Authorizing Provider  amLODipine  (NORVASC ) 5 MG tablet Take 1 tablet (5 mg total) by mouth daily. 04/15/24   Oley Bascom RAMAN, NP  atorvastatin  (LIPITOR) 20 MG tablet Take 1 tablet (20 mg total) by mouth daily. 04/15/24   Oley Bascom RAMAN, NP  carvedilol  (COREG ) 12.5 MG tablet Take 1 tablet (12.5 mg total) by mouth 2 (two) times daily with a meal. 04/15/24   Oley Bascom RAMAN, NP  dapagliflozin  propanediol (FARXIGA ) 10 MG TABS tablet Take 1 tablet (10 mg total) by mouth daily. 04/15/24   Oley Bascom RAMAN, NP  furosemide  (LASIX ) 20 MG tablet Take 1 tablet (20 mg total) by mouth as needed. For weight gain 3 lbs in 24 hours or 5 lbs in a week PLEASE SCHEDULE APPOINTMENT FOR MORE REFILLS 04/15/24   Glena Harlene HERO, FNP  gabapentin  (NEURONTIN ) 300 MG capsule Take 1 capsule (300 mg total) by mouth 2 (two) times daily. 01/12/23 03/15/23  Gonfa, Taye T, MD  hydrALAZINE  (APRESOLINE ) 100 MG tablet Take 1 tablet (100 mg total) by mouth 3 (three) times daily. 04/15/24   Oley Bascom RAMAN, NP  isosorbide  mononitrate (IMDUR ) 30 MG 24 hr tablet Take 2 tablets (60 mg total) by mouth daily. 04/15/24   Oley Bascom RAMAN, NP  sacubitril -valsartan  (ENTRESTO ) 49-51 MG Take 1 tablet by mouth 2 (two) times daily. PLEASE SCHEDULE APPOINTMENT FOR MORE REFILLS 04/15/24   Glena Harlene HERO, FNP    Physical Exam: BP (!) 174/99   Pulse 68   Temp (!) 97.4 F (36.3 C) (Oral)   Resp  14   Ht 5' 8 (1.727 m)   Wt 90.7 kg   LMP 12/31/2014   SpO2 95%   BMI 30.41 kg/m   General: 53 y.o. year-old female well developed well nourished in no acute distress.  Alert and oriented x3. Cardiovascular: Regular rate and rhythm with no rubs or gallops.  No thyromegaly or JVD noted.  No lower extremity edema. 2/4 pulses in all 4 extremities. Respiratory: Clear to auscultation with no wheezes or rales. Good inspiratory effort. Abdomen: Soft nontender nondistended with normal bowel sounds x4 quadrants. Muskuloskeletal: No cyanosis, clubbing or edema noted bilaterally Neuro: CN II-XII intact, strength, sensation, reflexes Skin: No ulcerative lesions noted or rashes Psychiatry: Judgement and insight appear normal. Mood is appropriate for condition and setting          Labs on Admission:  Basic Metabolic Panel: Recent Labs  Lab 05/27/24 1140  NA 136  K 3.9  CL 103  CO2 22  GLUCOSE 143*  BUN 13  CREATININE 0.91  CALCIUM  9.1   Liver Function Tests: Recent Labs  Lab 05/27/24 1140  AST 21  ALT 14  ALKPHOS 55  BILITOT 0.6  PROT 7.4  ALBUMIN  3.8   Recent Labs  Lab 05/27/24 1140  LIPASE 23   No results for input(s): AMMONIA in the last 168 hours. CBC: Recent Labs  Lab 05/27/24 1140  WBC 10.1  NEUTROABS 7.8*  HGB 13.1  HCT 41.5  MCV 84.2  PLT 259   Cardiac Enzymes: No results for input(s): CKTOTAL, CKMB, CKMBINDEX, TROPONINI in the last 168 hours.  BNP (last 3 results) No results for input(s): BNP in the last 8760 hours.  ProBNP (last 3 results) No results for input(s): PROBNP in the last 8760 hours.  CBG: No results for input(s): GLUCAP in the last 168 hours.  Radiological Exams on Admission: CT Angio Chest/Abd/Pel for Dissection W and/or Wo Contrast Result Date: 05/27/2024 EXAM: CTA CHEST, ABDOMEN AND PELVIS WITHOUT AND WITH CONTRAST 05/27/2024 08:06:16 PM TECHNIQUE: CTA of the chest was performed without and with the administration  of 100 mL of intravenous iohexol  (OMNIPAQUE ) 350 MG/ML. CTA of the abdomen and pelvis was performed without and with the administration of 100 mL of intravenous iohexol  (OMNIPAQUE ) 350 MG/ML. Multiplanar reformatted images are provided for review. MIP images are provided for review. Automated exposure control, iterative reconstruction, and/or weight based adjustment of the mA/kV was utilized to reduce the radiation dose to as low as reasonably achievable. COMPARISON: Comparison made to 11/19/2021. CLINICAL HISTORY: Acute aortic syndrome (AAS) suspected. FINDINGS: VASCULATURE: AORTA: Mild atherosclerotic calcification within the thoracic aorta. No aortic aneurysm. No thoracic intramural hematoma or dissection. No abdominal aortic aneurysm or dissection. PULMONARY ARTERIES: The central pulmonary arteries are enlarged in keeping with changes of pulmonary arterial hypertension. No pulmonary embolism with the limits of this exam. GREAT VESSELS OF AORTIC ARCH: Arch vasculature demonstrates classic anatomic configuration and is widely patent proximally. No dissection. No arterial occlusion or significant stenosis. 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 Distally, branch vessels are patent without aneurysm or dissection. SUPERIOR MESENTERIC ARTERY: No acute finding. No occlusion or significant stenosis. 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 Distally widely patent. RENAL ARTERIES: No acute finding. No occlusion or significant stenosis. ILIAC ARTERIES: Mild common iliac atherosclerotic calcification. No occlusion or significant stenosis. CHEST: MEDIASTINUM: Stable mild cardiomegaly with biventricular enlargement. Stable trace pericardial effusion. No significant coronary artery calcifications. No mediastinal lymphadenopathy. LUNGS AND PLEURA:  The lungs are without acute process. No focal consolidation or pulmonary edema. No evidence of pleural effusion or pneumothorax. THORACIC BONES AND SOFT TISSUES: Mild right basilar paravertebral fibrotic change. No acute bone abnormality. ABDOMEN AND PELVIS: LIVER: The liver is unremarkable. GALLBLADDER AND BILE DUCTS: Gallbladder is unremarkable. No biliary ductal dilatation. SPLEEN: The spleen is unremarkable. PANCREAS: The pancreas is unremarkable. ADRENAL GLANDS: Bilateral adrenal glands demonstrate no acute abnormality. KIDNEYS, URETERS AND BLADDER: No obstructing calculi are seen within the visualized upper pole of the right kidney measuring up to 5 mm. No stones in the ureters. No hydronephrosis. No perinephric or periureteral stranding. Urinary bladder is unremarkable. GI AND BOWEL: Stomach and duodenal sweep demonstrate no acute abnormality. There is no bowel obstruction. No abnormal bowel wall thickening or distension. REPRODUCTIVE: Bilateral tubal ligation clips are noted. PERITONEUM AND RETROPERITONEUM: No ascites or free air. LYMPH NODES: No lymphadenopathy. ABDOMINAL BONES  AND SOFT TISSUES: No acute abnormality of the bones. No acute soft tissue abnormality. IMPRESSION: 1. No evidence of acute aortic syndrome. No aortic aneurysm or dissection. 2. Stable mild cardiomegaly with biventricular enlargement and trace pericardial effusion. 3. Enlarged central pulmonary arteries consistent with pulmonary arterial hypertension. No pulmonary embolism. 4. 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 Distal branch vessels are patent without aneurysm or dissection. 5. 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 . Distally widely patent. Electronically signed by: Dorethia Molt MD 05/27/2024 09:09  PM EST RP Workstation: [id number]   DG Chest 2 View Result Date: 05/27/2024 CLINICAL DATA:  Left chest pain and shortness of breath. EXAM: CHEST - 2 VIEW COMPARISON:  01/12/2023 and CT chest 11/19/2021. FINDINGS: Trachea is midline. Heart is at the upper limits of normal in size to mildly enlarged. Lingular scarring. No airspace consolidation or pleural fluid. IMPRESSION: No acute findings. Electronically Signed   By: Newell Eke M.D.   On: 05/27/2024 13:28    EKG: I independently viewed the EKG done and my findings are as followed: Normal sinus rhythm rate of 77.  Nonspecific ST-T changes.  QTc 486.  Assessment/Plan Present on Admission:  Chest pain  Principal Problem:   Chest pain  Atypical chest pain, rule out ACS Received a full dose aspirin  325 mg x 1 Admission twelve-lead EKG nonacute High-sensitivity troponin negative x 2 Follow transthoracic echocardiogram Monitor on telemetry Continue pain control Rest of management per cardiology, consulted by EDP.  Hypertension BP is not at goal, elevated Resume home Coreg  Closely monitor vital signs.  Coronary artery disease Resume home Lipitor and Coreg  Add daily aspirin  Monitor on telemetry.  Chronic HFpEF Euvolemic Closely monitor volume status while on gentle IV fluid hydration Currently on LR at 50 cc/h x 10 hours. Monitor strict I's and O's and daily weight  Peripheral vascular disease Continue aspirin  and statin CT scan reveals: Approximately 50% stenosis of the celiac axis at its origin, likely related to median arcuate ligament compression, with poststenotic dilation.  Distal branch vessels are patent without aneurysm or dissection.  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  Distally widely patent.  Prediabetes Last hemoglobin A1c 6.0 on 01/24/2023 Diet controlled  Obesity BMI  30 Recommend weight loss outpatient with regular physical activity and healthy dieting.    Time: 75 minutes.    DVT prophylaxis: Subcu Lovenox  daily.  Code Status: Full code.  Family Communication: None at bedside.  Disposition Plan: Admitted to telemetry unit.  Consults called: EDP discussed the case with cardiology Dr. Pietro.  Admission status: Observation status.   Status is: Observation    Terry LOISE Hurst MD [facility] Pager [phone]  If 7PM-7AM, please contact night-coverage [email or web address] Password [facility]  05/27/2024, 9:47 PM

## 2024-05-27 NOTE — ED Notes (Signed)
 CCMD called for cardiac monitoring.

## 2024-05-27 NOTE — ED Provider Notes (Signed)
 [facility] AT [facility] Provider Note   CSN: [id number] Arrival date & time: 05/27/24  1107     Patient presents with: Chest Pain and Shortness of Breath   Emily Key is a 53 y.o. female with past medical history significant for hypertension, CKD, previous NSTEMI, CHF, hyperlipidemia with no history of diabetes, she reports history of previous tobacco use but that she has quit at this time.  She presents with concern for nausea, left-sided chest pain, left arm heaviness, left chest pressure.  She reports the symptoms began while driving to work, worsened during exertion and walking around at work.  Since waiting unfortunately for multiple hours in the emergency department patient reports that her chest pain remains ongoing, her left arm pressure has improved somewhat but is still present, with less severity than initial.  Chest pain continues to feel like pressure on the left side of the chest.    Chest Pain Associated symptoms: shortness of breath   Shortness of Breath Associated symptoms: chest pain        Prior to Admission medications   Medication Sig Start Date End Date Taking? Authorizing Provider  amLODipine  (NORVASC ) 5 MG tablet Take 1 tablet (5 mg total) by mouth daily. 04/15/24   Oley Bascom RAMAN, NP  atorvastatin  (LIPITOR) 20 MG tablet Take 1 tablet (20 mg total) by mouth daily. 04/15/24   Oley Bascom RAMAN, NP  carvedilol  (COREG ) 12.5 MG tablet Take 1 tablet (12.5 mg total) by mouth 2 (two) times daily with a meal. 04/15/24   Oley Bascom RAMAN, NP  dapagliflozin  propanediol (FARXIGA ) 10 MG TABS tablet Take 1 tablet (10 mg total) by mouth daily. 04/15/24   Oley Bascom RAMAN, NP  furosemide  (LASIX ) 20 MG tablet Take 1 tablet (20 mg total) by mouth as needed. For weight gain 3 lbs in 24 hours or 5 lbs in a week PLEASE SCHEDULE APPOINTMENT FOR MORE REFILLS 04/15/24   Glena Harlene HERO, FNP  gabapentin  (NEURONTIN ) 300 MG capsule Take 1 capsule (300  mg total) by mouth 2 (two) times daily. 01/12/23 03/15/23  Gonfa, Taye T, MD  hydrALAZINE  (APRESOLINE ) 100 MG tablet Take 1 tablet (100 mg total) by mouth 3 (three) times daily. 04/15/24   Oley Bascom RAMAN, NP  isosorbide  mononitrate (IMDUR ) 30 MG 24 hr tablet Take 2 tablets (60 mg total) by mouth daily. 04/15/24   Oley Bascom RAMAN, NP  sacubitril -valsartan  (ENTRESTO ) 49-51 MG Take 1 tablet by mouth 2 (two) times daily. PLEASE SCHEDULE APPOINTMENT FOR MORE REFILLS 04/15/24   Glena Harlene HERO, FNP    Allergies: Patient has no known allergies.    Review of Systems  Respiratory:  Positive for shortness of breath.   Cardiovascular:  Positive for chest pain.  All other systems reviewed and are negative.   Updated Vital Signs BP (!) 174/99   Pulse 68   Temp (!) 97.4 F (36.3 C) (Oral)   Resp 14   Ht 5' 8 (1.727 m)   Wt 90.7 kg   LMP 12/31/2014   SpO2 95%   BMI 30.41 kg/m   Physical Exam Vitals and nursing note reviewed.  Constitutional:      General: She is not in acute distress.    Appearance: Normal appearance.  HENT:     Head: Normocephalic and atraumatic.  Eyes:     General:        Right eye: No discharge.        Left eye: No discharge.  Cardiovascular:     Rate and Rhythm: Normal rate and regular rhythm.     Heart sounds: No murmur heard.    No friction rub. No gallop.  Pulmonary:     Effort: Pulmonary effort is normal.     Breath sounds: Normal breath sounds.  Abdominal:     General: Bowel sounds are normal.     Palpations: Abdomen is soft.  Skin:    General: Skin is warm and dry.     Capillary Refill: Capillary refill takes less than 2 seconds.  Neurological:     Mental Status: She is alert and oriented to person, place, and time.     Comments: Moves all 4 limbs spontaneously, CN II through XII grossly intact, can ambulate without difficulty, intact sensation throughout.  Psychiatric:        Mood and Affect: Mood normal.        Behavior: Behavior normal.      (all labs ordered are listed, but only abnormal results are displayed) Labs Reviewed  CBC WITH DIFFERENTIAL/PLATELET - Abnormal; Notable for the following components:      Result Value   Neutro Abs 7.8 (*)    All other components within normal limits  COMPREHENSIVE METABOLIC PANEL WITH GFR - Abnormal; Notable for the following components:   Glucose, Bld 143 (*)    All other components within normal limits  URINALYSIS, ROUTINE W REFLEX MICROSCOPIC - Abnormal; Notable for the following components:   Glucose, UA >=500 (*)    Hgb urine dipstick MODERATE (*)    All other components within normal limits  LIPASE, BLOOD  URINALYSIS, MICROSCOPIC (REFLEX)  TROPONIN I (HIGH SENSITIVITY)  TROPONIN I (HIGH SENSITIVITY)    EKG: None  Radiology: CT Angio Chest/Abd/Pel for Dissection W and/or Wo Contrast Result Date: 05/27/2024 EXAM: CTA CHEST, ABDOMEN AND PELVIS WITHOUT AND WITH CONTRAST 05/27/2024 08:06:16 PM TECHNIQUE: CTA of the chest was performed without and with the administration of 100 mL of intravenous iohexol  (OMNIPAQUE ) 350 MG/ML. CTA of the abdomen and pelvis was performed without and with the administration of 100 mL of intravenous iohexol  (OMNIPAQUE ) 350 MG/ML. Multiplanar reformatted images are provided for review. MIP images are provided for review. Automated exposure control, iterative reconstruction, and/or weight based adjustment of the mA/kV was utilized to reduce the radiation dose to as low as reasonably achievable. COMPARISON: Comparison made to 11/19/2021. CLINICAL HISTORY: Acute aortic syndrome (AAS) suspected. FINDINGS: VASCULATURE: AORTA: Mild atherosclerotic calcification within the thoracic aorta. No aortic aneurysm. No thoracic intramural hematoma or dissection. No abdominal aortic aneurysm or dissection. PULMONARY ARTERIES: The central pulmonary arteries are enlarged in keeping with changes of pulmonary arterial hypertension. No pulmonary embolism with the limits of  this exam. GREAT VESSELS OF AORTIC ARCH: Arch vasculature demonstrates classic anatomic configuration and is widely patent proximally. No dissection. No arterial occlusion or significant stenosis. 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 Distally, branch vessels are patent without aneurysm or dissection. SUPERIOR MESENTERIC ARTERY: No acute finding. No occlusion or significant stenosis. 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 Distally widely patent. RENAL ARTERIES: No acute finding. No occlusion or significant stenosis. ILIAC ARTERIES: Mild common iliac atherosclerotic calcification. No occlusion or significant stenosis. CHEST: MEDIASTINUM: Stable mild cardiomegaly with biventricular enlargement. Stable trace pericardial effusion. No significant coronary artery calcifications. No mediastinal lymphadenopathy. LUNGS AND PLEURA: The lungs  are without acute process. No focal consolidation or pulmonary edema. No evidence of pleural effusion or pneumothorax. THORACIC BONES AND SOFT TISSUES: Mild right basilar paravertebral fibrotic change. No acute bone abnormality. ABDOMEN AND PELVIS: LIVER: The liver is unremarkable. GALLBLADDER AND BILE DUCTS: Gallbladder is unremarkable. No biliary ductal dilatation. SPLEEN: The spleen is unremarkable. PANCREAS: The pancreas is unremarkable. ADRENAL GLANDS: Bilateral adrenal glands demonstrate no acute abnormality. KIDNEYS, URETERS AND BLADDER: No obstructing calculi are seen within the visualized upper pole of the right kidney measuring up to 5 mm. No stones in the ureters. No hydronephrosis. No perinephric or periureteral stranding. Urinary bladder is unremarkable. GI AND BOWEL: Stomach and duodenal sweep demonstrate no acute abnormality. There is no bowel obstruction. No abnormal bowel wall thickening  or distension. REPRODUCTIVE: Bilateral tubal ligation clips are noted. PERITONEUM AND RETROPERITONEUM: No ascites or free air. LYMPH NODES: No lymphadenopathy. ABDOMINAL BONES AND SOFT TISSUES: No acute abnormality of the bones. No acute soft tissue abnormality. IMPRESSION: 1. No evidence of acute aortic syndrome. No aortic aneurysm or dissection. 2. Stable mild cardiomegaly with biventricular enlargement and trace pericardial effusion. 3. Enlarged central pulmonary arteries consistent with pulmonary arterial hypertension. No pulmonary embolism. 4. 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 Distal branch vessels are patent without aneurysm or dissection. 5. 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 . Distally widely patent. Electronically signed by: Dorethia Molt MD 05/27/2024 09:09 PM EST RP Workstation: [id number]   DG Chest 2 View Result Date: 05/27/2024 CLINICAL DATA:  Left chest pain and shortness of breath. EXAM: CHEST - 2 VIEW COMPARISON:  01/12/2023 and CT chest 11/19/2021. FINDINGS: Trachea is midline. Heart is at the upper limits of normal in size to mildly enlarged. Lingular scarring. No airspace consolidation or pleural fluid. IMPRESSION: No acute findings. Electronically Signed   By: Newell Eke M.D.   On: 05/27/2024 13:28     Procedures   Medications Ordered in the ED  morphine  (PF) 4 MG/ML injection 4 mg (has no administration in time range)  morphine  (PF) 4 MG/ML injection 4 mg (4 mg Intravenous Given 05/27/24 1922)  ketorolac  (TORADOL ) 15 MG/ML injection 15 mg (15 mg Intravenous Given 05/27/24 1920)  ondansetron  (ZOFRAN ) injection 4 mg (4 mg Intravenous Given 05/27/24 1919)  hydrALAZINE  (APRESOLINE ) injection 10 mg (10 mg Intravenous Given 05/27/24 2038)   iohexol  (OMNIPAQUE ) 350 MG/ML injection 100 mL (100 mLs Intravenous Contrast Given 05/27/24 2008)                                    Medical Decision Making Risk Prescription drug management.   This patient is a 53 y.o. female  who presents to the ED for concern of chest pain.   Differential diagnoses prior to evaluation: The emergent differential diagnosis includes, but is not limited to,  ACS, AAS, PE, Mallory-Weiss, Boerhaave's, Pneumonia, acute bronchitis, asthma or COPD exacerbation, anxiety, MSK pain or traumatic injury to the chest, acid reflux versus other . This is not an exhaustive differential.   Past Medical History / Co-morbidities / Social History: hypertension, CKD, previous NSTEMI, CHF, hyperlipidemia with no history of diabetes, she reports history of previous tobacco use but that she has quit at this time  Additional history: Chart reviewed. Pertinent results include:  Reviewed previous echo, catheterization, patient with left ventricular hypertrophy but no obstructive coronary artery disease  Physical Exam: Physical exam performed. The pertinent findings include: No wheezing, rhonchi, stridor, rales, moves both limbs spontaneously, no weakness of upper or lower extremity on exam.  Lab Tests/Imaging studies: I personally interpreted labs/imaging and the pertinent results include: CBC unremarkable, CMP overall unremarkable, mildly elevated glucose 143 on nonfasting lab values, normal lipase.  UA with moderate hemoglobin, no bacteria noted.  I dependently interpreted plain film chest x-ray which shows no evidence of acute intrathoracic abnormality.  I agree with the radiologist interpretation.  CT angio shows:  1. No evidence of acute aortic syndrome. No aortic aneurysm or dissection.  2. Stable mild cardiomegaly with biventricular enlargement and trace  pericardial effusion.  3. Enlarged central pulmonary arteries consistent with pulmonary arterial  hypertension. No  pulmonary embolism.  4. 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 Distal  branch vessels are patent without aneurysm or dissection.  5. 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 . Distally widely patent.    Cardiac monitoring: EKG obtained and interpreted by myself and attending physician which shows: Normal sinus rhythm, no acute ST-T changes, similar appearance to recent previous   Medications: I ordered medication including morphine , Toradol , Zofran  for pain, nausea.  I have reviewed the patients home medicines and have made adjustments as needed.   Consults: spoke with Dr. 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 Shona who agrees to admission for high risk chest pain  Disposition: After consideration of the diagnostic results and the patients response to treatment, I feel that patient would benefit from hospital admission as discussed above.    Final diagnoses:  Atypical chest pain    ED Discharge Orders     None          Rosan Sherlean DEL, PA-C 05/27/24 2135    Pamella Ozell LABOR, DO 05/27/24 2338

## 2024-05-28 ENCOUNTER — Observation Stay (HOSPITAL_COMMUNITY)

## 2024-05-28 DIAGNOSIS — I5042 Chronic combined systolic (congestive) and diastolic (congestive) heart failure: Secondary | ICD-10-CM | POA: Diagnosis not present

## 2024-05-28 DIAGNOSIS — R0789 Other chest pain: Secondary | ICD-10-CM

## 2024-05-28 DIAGNOSIS — R079 Chest pain, unspecified: Secondary | ICD-10-CM | POA: Diagnosis not present

## 2024-05-28 DIAGNOSIS — I1A0 Resistant hypertension: Secondary | ICD-10-CM

## 2024-05-28 DIAGNOSIS — I428 Other cardiomyopathies: Secondary | ICD-10-CM

## 2024-05-28 DIAGNOSIS — Z91148 Patient's other noncompliance with medication regimen for other reason: Secondary | ICD-10-CM

## 2024-05-28 LAB — BASIC METABOLIC PANEL WITH GFR
Anion gap: 9 (ref 5–15)
BUN: 12 mg/dL (ref 6–20)
CO2: 25 mmol/L (ref 22–32)
Calcium: 9.2 mg/dL (ref 8.9–10.3)
Chloride: 104 mmol/L (ref 98–111)
Creatinine, Ser: 0.83 mg/dL (ref 0.44–1.00)
GFR, Estimated: >60 mL/min (ref >60)
Glucose, Bld: 121 mg/dL — ABNORMAL HIGH (ref 70–99)
Potassium: 3.5 mmol/L (ref 3.5–5.1)
Sodium: 138 mmol/L (ref 135–145)

## 2024-05-28 LAB — PHOSPHORUS: Phosphorus: 4.3 mg/dL (ref 2.5–4.6)

## 2024-05-28 LAB — CBC
HCT: 44.5 % (ref 36.0–46.0)
Hemoglobin: 13.9 g/dL (ref 12.0–15.0)
MCH: 26.7 pg (ref 26.0–34.0)
MCHC: 31.2 g/dL (ref 30.0–36.0)
MCV: 85.6 fL (ref 80.0–100.0)
Platelets: 266 K/uL (ref 150–400)
RBC: 5.2 MIL/uL — ABNORMAL HIGH (ref 3.87–5.11)
RDW: 14 % (ref 11.5–15.5)
WBC: 8.7 K/uL (ref 4.0–10.5)
nRBC: 0 % (ref 0.0–0.2)

## 2024-05-28 LAB — ECHOCARDIOGRAM COMPLETE
Area-P 1/2: 1.74 cm2
Height: 68 in
MV M vel: 6.09 m/s
MV Peak grad: 148.4 mmHg
Radius: 0.5 cm
S' Lateral: 5.8 cm
Weight: 3200 [oz_av]

## 2024-05-28 LAB — MAGNESIUM: Magnesium: 2.3 mg/dL (ref 1.7–2.4)

## 2024-05-28 MED ORDER — DICLOFENAC SODIUM 1 % EX GEL
2.0000 g | Freq: Four times a day (QID) | CUTANEOUS | Status: DC
  Filled 2024-05-28: qty 100

## 2024-05-28 MED ORDER — GADOBUTROL 1 MMOL/ML IV SOLN
10.0000 mL | Freq: Once | INTRAVENOUS | Status: AC | PRN
  Administered 2024-05-28: 10 mL via INTRAVENOUS

## 2024-05-28 MED ORDER — NITROGLYCERIN 0.4 MG SL SUBL
0.4000 mg | SUBLINGUAL_TABLET | SUBLINGUAL | Status: DC | PRN
Start: 2024-05-28 — End: 2024-05-29
  Administered 2024-05-28: 0.4 mg via SUBLINGUAL
  Filled 2024-05-28: qty 1

## 2024-05-28 MED ORDER — ISOSORBIDE DINITRATE 10 MG PO TABS
30.0000 mg | ORAL_TABLET | Freq: Three times a day (TID) | ORAL | Status: DC
  Administered 2024-05-28 – 2024-05-29 (×2): 30 mg via ORAL
  Filled 2024-05-28 (×3): qty 3

## 2024-05-28 MED ORDER — HYDRALAZINE HCL 25 MG PO TABS
50.0000 mg | ORAL_TABLET | Freq: Three times a day (TID) | ORAL | Status: DC
Start: 2024-05-28 — End: 2024-05-29
  Administered 2024-05-28 – 2024-05-29 (×2): 50 mg via ORAL
  Filled 2024-05-28 (×2): qty 2

## 2024-05-28 MED ORDER — DAPAGLIFLOZIN PROPANEDIOL 10 MG PO TABS
10.0000 mg | ORAL_TABLET | Freq: Every day | ORAL | Status: DC
  Administered 2024-05-28 – 2024-05-29 (×2): 10 mg via ORAL
  Filled 2024-05-28 (×2): qty 1

## 2024-05-28 MED ORDER — SACUBITRIL-VALSARTAN 49-51 MG PO TABS
1.0000 | ORAL_TABLET | Freq: Two times a day (BID) | ORAL | Status: DC
  Administered 2024-05-28 – 2024-05-29 (×2): 1 via ORAL
  Filled 2024-05-28 (×3): qty 1

## 2024-05-28 NOTE — Progress Notes (Signed)
 PROGRESS NOTE    Emily Key  FMW:[id number] DOB: [birth year] DOA: 05/27/2024 PCP: Paseda, Folashade R, FNP  Chief Complaint  Patient presents with   Chest Pain   Shortness of Breath    Brief Narrative:   Emily Key is Emily Key 53 y.o. 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   Admitted for CP rule out.  Cardiology c/s pending.  Assessment & Plan:   Principal Problem:   Chest pain  Atypical chest pain CAD Component of reproducibility Troponin negative x2 Echo with septal hypokinesis, lateral hypokinesis, EF 45-50% Trial nitroglycerin  for CP.  Trial voltaren given concern for msk component. 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  Distal branch vessels are patent without aneurysm or dissection.  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  Distally widely patent. Follow with outpatient, asx   Prediabetes Last hemoglobin A1c 6.0 on 01/24/2023 Diet controlled    DVT prophylaxis: lovenox  Code Status: full Family Communication: none Disposition:   Status is: Observation The patient remains OBS  appropriate and will d/c before 2 midnights.   Consultants:  cardiology  Procedures:  Echo IMPRESSIONS     1. Septal hypokinesis. Lateral hypokinesis (base, mid). Left ventricular  ejection fraction, by estimation, is 45 to 50%. The left ventricle has  mildly decreased function. The left ventricular internal cavity size was  severely dilated. Left ventricular  diastolic parameters are consistent with Grade I diastolic dysfunction  (impaired relaxation).   2. Right ventricular systolic function is normal. The right ventricular  size is normal. There is normal pulmonary artery systolic pressure.   3. Emily Key is present.   4. The mitral valve is normal in structure. Mild mitral valve  regurgitation.   5. The aortic valve is tricuspid. Aortic valve regurgitation is not  visualized.   Comparison(s): The left ventricular function has improved.   Antimicrobials:  Anti-infectives (From admission, onward)    None       Subjective: Notes CP and HA Chest pressure  Objective: Vitals:   05/28/24 1400 05/28/24 1500 05/28/24 1515 05/28/24 1529  BP: (!) 182/88 (!) 142/126    Pulse: (!) 58 72    Resp: 19  16   Temp:    98 F (36.7 C)  TempSrc:    Oral  SpO2: 98% 97%    Weight:      Height:       No intake or output data in the 24 hours ending 05/28/24 1711 Filed Weights   05/27/24 1121  Weight: 90.7 kg    Examination:  General exam: Appears calm and comfortable  Respiratory system: Clear to auscultation. Respiratory effort normal. Cardiovascular system: S1 & S2 heard, RRR. CP reproducible with palpation Gastrointestinal system: Abdomen is nondistended, soft and  nontender.  Central nervous system: Alert and oriented. No focal neurological deficits. Extremities: no LEE   Data Reviewed: I have personally reviewed following labs and imaging studies  CBC: Recent Labs  Lab 05/27/24 1140 05/28/24 0228  WBC 10.1 8.7  NEUTROABS 7.8*  --   HGB  13.1 13.9  HCT 41.5 44.5  MCV 84.2 85.6  PLT 259 266    Basic Metabolic Panel: Recent Labs  Lab 05/27/24 1140 05/28/24 0359  NA 136 138  K 3.9 3.5  CL 103 104  CO2 22 25  GLUCOSE 143* 121*  BUN 13 12  CREATININE 0.91 0.83  CALCIUM  9.1 9.2  MG  --  2.3  PHOS  --  4.3    GFR: Estimated Creatinine Clearance: 90.2 mL/min (by C-G formula based on SCr of 0.83 mg/dL).  Liver Function Tests: Recent Labs  Lab 05/27/24 1140  AST 21  ALT 14  ALKPHOS 55  BILITOT 0.6  PROT 7.4  ALBUMIN  3.8    CBG: No results for input(s): GLUCAP in the last 168 hours.   No results found for this or any previous visit (from the past 240 hours).       Radiology Studies: ECHOCARDIOGRAM COMPLETE Result Date: 05/28/2024    ECHOCARDIOGRAM REPORT   Patient Name:   Emily Key Date of Exam: 05/28/2024 Medical Rec #:  [id number]     Height:       68.0 in Accession #:    [id number]    Weight:       200.0 lb Date of Birth:  1971-03-12     BSA:          2.044 m Patient Age:    53 years      BP:           173/89 mmHg Patient Gender: F             HR:           55 bpm. Exam Location:  Inpatient Procedure: 2D Echo, Cardiac Doppler and Color Doppler (Both Spectral and Color            Flow Doppler were utilized during procedure). Indications:    Chest Pain R07.9  History:        Patient has prior history of Echocardiogram examinations, most                 recent 03/10/2022. CHF, Previous Myocardial Infarction,                 Signs/Symptoms:Chest Pain; Risk Factors:Hypertension,                 Dyslipidemia and Former Smoker.  Sonographer:    Merlynn Argyle Referring Phys: [id number] CAROLE N HALL IMPRESSIONS  1. Septal hypokinesis. Lateral hypokinesis (base, mid). Left ventricular ejection fraction, by estimation, is 45 to 50%. The left ventricle has mildly decreased function. The left ventricular internal cavity size was severely dilated. Left ventricular diastolic parameters are consistent with Grade I diastolic  dysfunction (impaired relaxation).  2. Right ventricular systolic function is normal. The right ventricular size is normal. There is normal pulmonary artery systolic pressure.  3. Emily Key small pericardial Key is present.  4. The mitral valve is normal in structure. Mild mitral valve regurgitation.  5. The aortic valve is tricuspid. Aortic valve regurgitation is not visualized. Comparison(s): The left ventricular function has improved. FINDINGS  Left Ventricle: Septal hypokinesis. Lateral hypokinesis (base, mid). Left ventricular ejection fraction, by estimation, is 45  to 50%. The left ventricle has mildly decreased function. The left ventricular internal cavity size was severely dilated. There  is no left ventricular hypertrophy. Left ventricular diastolic parameters are consistent with Grade I diastolic dysfunction (impaired relaxation). Right Ventricle: The right ventricular size is normal. Right vetricular wall thickness was not assessed. Right ventricular systolic function is normal. There is normal pulmonary artery systolic pressure. The tricuspid regurgitant velocity is 2.13 m/s, and with an assumed right atrial pressure of 3 mmHg, the estimated right ventricular systolic pressure is 21.1 mmHg. Left Atrium: Left atrial size was normal in size. Right Atrium: Right atrial size was normal in size. Pericardium: Vuong Musa small pericardial Key is present. Mitral Valve: The mitral valve is normal in structure. Mild mitral valve regurgitation. Tricuspid Valve: The tricuspid valve is normal in structure. Tricuspid valve regurgitation is trivial. Aortic Valve: The aortic valve is tricuspid. Aortic valve regurgitation is not visualized. Pulmonic Valve: The pulmonic valve was not well visualized. Pulmonic valve regurgitation is not visualized. No evidence of pulmonic stenosis. Aorta: The aortic root and ascending aorta are structurally normal, with no evidence of dilitation. IAS/Shunts: No atrial level shunt detected by  color flow Doppler.  LEFT VENTRICLE PLAX 2D LVIDd:         6.70 cm   Diastology LVIDs:         5.80 cm   LV e' medial:    3.89 cm/s LV PW:         0.90 cm   LV E/e' medial:  18.4 LV IVS:        0.70 cm   LV e' lateral:   4.82 cm/s LVOT diam:     2.10 cm   LV E/e' lateral: 14.9 LV SV:         53 LV SV Index:   26 LVOT Area:     3.46 cm LV IVRT:       151 msec  RIGHT VENTRICLE             IVC RV Basal diam:  2.70 cm     IVC diam: 1.20 cm RV S prime:     11.30 cm/s TAPSE (M-mode): 2.5 cm      PULMONARY VEINS                             Diastolic Velocity: 30.60 cm/s                             S/D Velocity:       1.40                             Systolic Velocity:  43.70 cm/s LEFT ATRIUM             Index        RIGHT ATRIUM           Index LA diam:        4.40 cm 2.15 cm/m   RA Area:     14.00 cm LA Vol (A2C):   75.8 ml 37.09 ml/m  RA Volume:   32.60 ml  15.95 ml/m LA Vol (A4C):   55.5 ml 27.16 ml/m LA Biplane Vol: 66.8 ml 32.69 ml/m  AORTIC VALVE LVOT Vmax:   76.40 cm/s LVOT Vmean:  49.800 cm/s LVOT VTI:    0.153 m  AORTA Ao  Root diam: 3.30 cm Ao Asc diam:  3.10 cm MITRAL VALVE                  TRICUSPID VALVE MV Area (PHT): 1.74 cm       TR Peak grad:   18.1 mmHg MV Decel Time: 437 msec       TR Vmax:        213.00 cm/s MR Peak grad:    148.4 mmHg MR Mean grad:    87.5 mmHg    SHUNTS MR Vmax:         609.00 cm/s  Systemic VTI:  0.15 m MR Vmean:        435.5 cm/s   Systemic Diam: 2.10 cm MR PISA:         1.57 cm MR PISA Eff ROA: 8 mm MR PISA Radius:  0.50 cm MV E velocity: 71.60 cm/s MV Khalif Stender velocity: 96.60 cm/s MV E/Youssouf Shipley ratio:  0.74 Vina Gull MD Electronically signed by Vina Gull MD Signature Date/Time: 05/28/2024/2:26:55 PM    Final    CT Angio Chest/Abd/Pel for Dissection W and/or Wo Contrast Result Date: 05/27/2024 EXAM: CTA CHEST, ABDOMEN AND PELVIS WITHOUT AND WITH CONTRAST 05/27/2024 08:06:16 PM TECHNIQUE: CTA of the chest was performed without and with the administration of 100 mL of intravenous  iohexol  (OMNIPAQUE ) 350 MG/ML. CTA of the abdomen and pelvis was performed without and with the administration of 100 mL of intravenous iohexol  (OMNIPAQUE ) 350 MG/ML. Multiplanar reformatted images are provided for review. MIP images are provided for review. Automated exposure control, iterative reconstruction, and/or weight based adjustment of the mA/kV was utilized to reduce the radiation dose to as low as reasonably achievable. COMPARISON: Comparison made to 11/19/2021. CLINICAL HISTORY: Acute aortic syndrome (AAS) suspected. FINDINGS: VASCULATURE: AORTA: Mild atherosclerotic calcification within the thoracic aorta. No aortic aneurysm. No thoracic intramural hematoma or dissection. No abdominal aortic aneurysm or dissection. PULMONARY ARTERIES: The central pulmonary arteries are enlarged in keeping with changes of pulmonary arterial hypertension. No pulmonary embolism with the limits of this exam. GREAT VESSELS OF AORTIC ARCH: Arch vasculature demonstrates classic anatomic configuration and is widely patent proximally. No dissection. No arterial occlusion or significant stenosis. 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 Distally, branch vessels are patent without aneurysm or dissection. SUPERIOR MESENTERIC ARTERY: No acute finding. No occlusion or significant stenosis. 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 Distally widely patent. RENAL ARTERIES: No acute finding. No occlusion or significant stenosis. ILIAC ARTERIES: Mild common iliac atherosclerotic calcification. No occlusion or significant stenosis. CHEST: MEDIASTINUM: Stable mild cardiomegaly with biventricular enlargement. Stable trace pericardial Key. No significant coronary artery calcifications. No mediastinal lymphadenopathy. LUNGS AND PLEURA: The lungs are without  acute process. No focal consolidation or pulmonary edema. No evidence of pleural Key or pneumothorax. THORACIC BONES AND SOFT TISSUES: Mild right basilar paravertebral fibrotic change. No acute bone abnormality. ABDOMEN AND PELVIS: LIVER: The liver is unremarkable. GALLBLADDER AND BILE DUCTS: Gallbladder is unremarkable. No biliary ductal dilatation. SPLEEN: The spleen is unremarkable. PANCREAS: The pancreas is unremarkable. ADRENAL GLANDS: Bilateral adrenal glands demonstrate no acute abnormality. KIDNEYS, URETERS AND BLADDER: No obstructing calculi are seen within the visualized upper pole of the right kidney measuring up to 5 mm. No stones in the ureters. No hydronephrosis. No perinephric or periureteral stranding. Urinary bladder is unremarkable. GI AND  BOWEL: Stomach and duodenal sweep demonstrate no acute abnormality. There is no bowel obstruction. No abnormal bowel wall thickening or distension. REPRODUCTIVE: Bilateral tubal ligation clips are noted. PERITONEUM AND RETROPERITONEUM: No ascites or free air. LYMPH NODES: No lymphadenopathy. ABDOMINAL BONES AND SOFT TISSUES: No acute abnormality of the bones. No acute soft tissue abnormality. IMPRESSION: 1. No evidence of acute aortic syndrome. No aortic aneurysm or dissection. 2. Stable mild cardiomegaly with biventricular enlargement and trace pericardial Key. 3. Enlarged central pulmonary arteries consistent with pulmonary arterial hypertension. No pulmonary embolism. 4. 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 Distal branch vessels are patent without aneurysm or dissection. 5. 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 . Distally widely patent. Electronically signed by: Dorethia Molt MD 05/27/2024 09:09 PM EST RP Workstation:  [id number]   DG Chest 2 View Result Date: 05/27/2024 CLINICAL DATA:  Left chest pain and shortness of breath. EXAM: CHEST - 2 VIEW COMPARISON:  01/12/2023 and CT chest 11/19/2021. FINDINGS: Trachea is midline. Heart is at the upper limits of normal in size to mildly enlarged. Lingular scarring. No airspace consolidation or pleural fluid. IMPRESSION: No acute findings. Electronically Signed   By: Newell Eke M.D.   On: 05/27/2024 13:28        Scheduled Meds:  aspirin  EC  81 mg Oral Daily   atorvastatin   20 mg Oral Daily   carvedilol   6.25 mg Oral BID WC   diclofenac Sodium  2 g Topical QID   enoxaparin  (LOVENOX ) injection  40 mg Subcutaneous Q24H   Continuous Infusions:   LOS: 0 days    Time spent: over 30 min     Meliton Monte, MD [facility]   To contact the attending provider between 7A-7P or the covering provider during after hours 7P-7A, please log into the web site [email or web address] and access using universal [place] password for that web site. If you do not have the password, please call the hospital operator.  05/28/2024, 5:11 PM

## 2024-05-28 NOTE — ED Notes (Signed)
 Pt ambulatory to and from restroom with steady gait

## 2024-05-28 NOTE — ED Notes (Signed)
 Patient transported to MRI

## 2024-05-28 NOTE — Consult Note (Signed)
 Cardiology Consultation   Patient ID: Emily Key MRN: [id number]; DOB: [birth year]  Admit date: 05/27/2024 Date of Consult: 05/28/2024  PCP:  Paseda, Folashade R, FNP   [facility] Providers Cardiologist:  Toribio Fuel, MD  Advanced Heart Failure:  Toribio Fuel, MD       Patient Profile: Emily Key is a 53 y.o. female with a hx of hypertension, hyperlipidemia, non-obstructive CAD [facility] in 2022 showed 45% stenosis to LAD} chronic systolic and diastolic heart failure [imEF 20-25% in 2022 and EF 55-60% in 2023], pulmonary hypertension, hx of L VATS w/ drainage of empyema with abscess and decortication in 2016, and hx of medication non-compliance who is being seen 05/28/2024 for the evaluation of chest pain at the request of A Meliton Monte MD.  History of Present Illness: Emily Key with past medical history above did undergo cardiomyopathy work-up including LHC in 2022 showing nonobstructive CAD. cMR showed hypertensive cardiomyopathy and pulmonary hypertension. Last Cardiology visit was with Harlene Gainer in [facility] clinic for hospital follow up. At that time was reporting NYHA II/III symptoms with headache and blurred vision. She was restarted on entresto  and continued on coreg , bi-dil, and farxiga .  She has had previous admissions for chest pain in the setting of hypertensive urgency 2/2 medication non-compliance.   Presented to the ED on 11/17 for nausea, diaphoresis, SOB and left sided chest pain with radiation to left arm, neck, and shoulder.  BP: 162/87   HR 67 ECG: Sinus rhythm with extreme axis deviation, absent R wave progression, VR 76 [similar to previous] CXR showed no acute findings CTA C/A/P showed no evidence of aortic syndrome, stable cardiomegaly with biventricular enlargement and trace pericardial effusion. Enlarged pulmonary arteries without evidence of PE. 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  Echo showed LVEF  45-50% with septal and lateral [base and mid] hypokinesis. G1 DD. Normal PASP. Small pericardial effusion.   Pertinent lab work:  Unremarkable CMP and CBC Negative Troponins  She was loaded with ASA, IV fludis, morphine , zofran . Received one dose of IV hydralazine  for BP.   On interview, patient was tearful as she was uncertain what was going on and she was upset that her family was worried about her.  Shared yesterday morning on the way to work she had sudden onset of nausea followed by chest heaviness with associated left arm and neck discomfort/numbness.  She did become diaphoretic and short of breath.  She reports no medications administered in the ED have been beneficial.  Currently has chest pain 5 out of 10.  Reported a cough for several weeks.  She does have DOE, though reports being able to walk about a block.  Did not anginal symptoms, though did report she had an episode of chest pain last week sudden onset at rest described as a heaviness that was present for about 20 to 30 minutes and went away without intervention. Reported compliance with medications.  Has not needed to use her as needed Lasix  in a long time.  Past Medical History:  Diagnosis Date   Cerumen impaction 01/2019   CHF (congestive heart failure) (HCC)    after last pregnancy   Daily headache    Heart murmur    born w/one:   History of hiatal hernia    Hyperlipidemia 01/2019   Hypertension    Hypertensive emergency 01/02/2019   Observed seizure-like activity (HCC) 01/02/2019   Pneumonia 11/2014   Seasonal allergies 01/2019   Vitamin D   deficiency 01/2019    Past Surgical History:  Procedure Laterality Date   CARDIAC CATHETERIZATION N/A 02/13/2015   Procedure: Left Heart Cath and Coronary Angiography;  Surgeon: Debby DELENA Sor, MD;  Location[facility] INVASIVE CV LAB;  Service: Cardiovascular;  Laterality: N/A;   PLEURAL EFFUSION DRAINAGE Left 11/14/2014   Procedure: DRAINAGE OF LEFT PLEURAL EFFUSION;  Surgeon: Elspeth JAYSON Millers, MD;  Location: [facility] OR;  Service: Thoracic;  Laterality: Left;   RIGHT/LEFT HEART CATH AND CORONARY ANGIOGRAPHY N/A 12/24/2020   Procedure: RIGHT/LEFT HEART CATH AND CORONARY ANGIOGRAPHY;  Surgeon: Cherrie Toribio SAUNDERS, MD;  Location: [facility] INVASIVE CV LAB;  Service: Cardiovascular;  Laterality: N/A;   TONSILLECTOMY AND ADENOIDECTOMY  ~ 1986   TUBAL LIGATION  2000   VIDEO ASSISTED THORACOSCOPY (VATS)/DECORTICATION Left 11/14/2014   Procedure: LEFT VIDEO ASSISTED THORACOSCOPY (VATS)/DECORTICATION;  Surgeon: Elspeth JAYSON Millers, MD;  Location: [facility] OR;  Service: Thoracic;  Laterality: Left;       Scheduled Meds:  aspirin  EC  81 mg Oral Daily   atorvastatin   20 mg Oral Daily   carvedilol   6.25 mg Oral BID WC   diclofenac Sodium  2 g Topical QID   enoxaparin  (LOVENOX ) injection  40 mg Subcutaneous Q24H   Continuous Infusions:  PRN Meds: acetaminophen , melatonin, morphine  injection, nitroGLYCERIN , oxyCODONE , polyethylene glycol, prochlorperazine  Allergies:   No Known Allergies  Social History:   Social History   Socioeconomic History   Marital status: Divorced    Spouse name: Not on file   Number of children: 2   Years of education: Not on file   Highest education level: Not on file  Occupational History   Not on file  Tobacco Use   Smoking status: Former    Current packs/day: 0.00    Average packs/day: 0.3 packs/day for 10.0 years (3.3 ttl pk-yrs)    Types: Cigarettes    Start date: 11/10/2004    Quit date: 11/11/2014    Years since quitting: 9.5   Smokeless tobacco: Never  Vaping Use   Vaping status: Never Used  Substance and Sexual Activity   Alcohol use: Yes    Alcohol/week: 0.0 standard drinks of alcohol    Comment: 8/1/20176 a couple beers a couple times/month   Drug use: No   Sexual activity: Yes  Other Topics Concern   Not on file  Social History Narrative   Lives with her sister    Social Drivers of Health   Financial Resource Strain: High Risk  (12/23/2020)   Overall Financial Resource Strain (CARDIA)    Difficulty of Paying Living Expenses: Hard  Food Insecurity: No Food Insecurity (05/28/2024)   Hunger Vital Sign    Worried About Running Out of Food in the Last Year: Never true    Ran Out of Food in the Last Year: Never true  Transportation Needs: No Transportation Needs (05/28/2024)   PRAPARE - Administrator, Civil Service (Medical): No    Lack of Transportation (Non-Medical): No  Physical Activity: Not on file  Stress: Not on file  Social Connections: Unknown (05/28/2024)   Social Connection and Isolation Panel    Frequency of Communication with Friends and Family: More than three times a week    Frequency of Social Gatherings with Friends and Family: Twice a week    Attends Religious Services: Patient declined    Database Administrator or Organizations: Patient declined    Attends Banker Meetings: Patient declined    Marital Status:  Patient declined  Intimate Partner Violence: Not At Risk (05/28/2024)   Humiliation, Afraid, Rape, and Kick questionnaire    Fear of Current or Ex-Partner: No    Emotionally Abused: No    Physically Abused: No    Sexually Abused: No    Family History:   Family History  Problem Relation Age of Onset   Diabetes Mellitus II Mother    Hypertension Mother    Kidney failure Mother    Lung cancer Father    Stroke Maternal Grandmother      ROS:  Please see the history of present illness.  All other ROS reviewed and negative.     Physical Exam/Data: Vitals:   05/28/24 1400 05/28/24 1500 05/28/24 1515 05/28/24 1529  BP: (!) 182/88 (!) 142/126    Pulse: (!) 58 72    Resp: 19  16   Temp:    98 F (36.7 C)  TempSrc:    Oral  SpO2: 98% 97%    Weight:      Height:       No intake or output data in the 24 hours ending 05/28/24 1531    05/27/2024   11:21 AM 03/08/2023    3:02 PM 01/30/2023    2:23 PM  Last 3 Weights  Weight (lbs) 200 lb 200 lb 207 lb 3.2  oz  Weight (kg) 90.719 kg 90.719 kg 93.985 kg     Body mass index is 30.41 kg/m.  General:  Well nourished, well developed, in no acute distress HEENT: normal Neck: no JVD, mild HJR Vascular: Distal pulses 2+ bilaterally Cardiac:  normal S1, S2; RRR; 1/6 systolic murmur, pain with palpation to chest wall Lungs:  clear to auscultation bilaterally, no wheezing, rhonchi or rales  Abd: soft, tender to right upper quadrant and left upper quadrant Ext: no edema, warm extremities Musculoskeletal:  No deformities, BUE and BLE strength normal and equal Skin: warm and dry  Neuro:  CNs 2-12 intact, no focal abnormalities noted Psych:  Normal affect   EKG:  The EKG was personally reviewed and demonstrates:  See hpi Telemetry:  Telemetry was personally reviewed and demonstrates: Sinus rhythm with occasional PVC, 1 episode of trigeminy heart rate 55  Relevant CV Studies: Echocardiogram 02/2022 IMPRESSIONS     1. Left ventricular ejection fraction, by estimation, is 55 to 60%. The  left ventricle has normal function. The left ventricle has no regional  wall motion abnormalities. There is severe concentric left ventricular  hypertrophy. Left ventricular diastolic   parameters are consistent with Grade I diastolic dysfunction (impaired  relaxation).   2. Right ventricular systolic function is normal. The right ventricular  size is normal. There is normal pulmonary artery systolic pressure.   3. Left atrial size was mildly dilated.   4. The mitral valve is normal in structure. Trivial mitral valve  regurgitation. No evidence of mitral stenosis.   5. The aortic valve is normal in structure. Aortic valve regurgitation is  not visualized. No aortic stenosis is present.   6. The inferior vena cava is normal in size with greater than 50%  respiratory variability, suggesting right atrial pressure of 3 mmHg.   cMR 12/2020 IMPRESSION: 1. Severely reduced left ventricular systolic function with  moderate left ventricular dilation, LVEF 22%. Global hypokinesis.   2. Moderately reduced right ventricular systolic function with mild right ventricular enlargement. RVEF 35%. Global hypokinesis.   3. ECV 35%, elevated and may be consistent with hypertensive heart disease.   4. Delayed myocardial  enhancement in the superior and inferior RV insertion points, with extension to the midmyocardium in the antero- and inferoseptum. This may represent increased pulmonary artery pressure.   5. Small circumferential pericardial effusion.   Findings most consistent with hypertensive heart disease and pulmonary hypertension.  RHC/LHC 12/2020   Prox LAD lesion is 45% stenosed.   Findings:   Ao = 177/94 (126) LV = 176/18 RA = 5 RV = 64/9 PA = 64/17 (35) PCW = 18 Fick cardiac output/index = 5.0/2.8 PVR = 3.4 WU Ao sat = 97% PA sat = 68%, 69%   Assessment: 1. Mild CAD 2. Severe NICM likely due to HTN CM 3. 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 Left-sided pressures relatively well compemsated.   Laboratory Data: High Sensitivity Troponin:   Recent Labs  Lab 05/27/24 1140 05/27/24 1525  TROPONINIHS 8 8     Chemistry Recent Labs  Lab 05/27/24 1140 05/28/24 0359  NA 136 138  K 3.9 3.5  CL 103 104  CO2 22 25  GLUCOSE 143* 121*  BUN 13 12  CREATININE 0.91 0.83  CALCIUM  9.1 9.2  MG  --  2.3  GFRNONAA >60 >60  ANIONGAP 11 9    Recent Labs  Lab 05/27/24 1140  PROT 7.4  ALBUMIN  3.8  AST 21  ALT 14  ALKPHOS 55  BILITOT 0.6   Hematology Recent Labs  Lab 05/27/24 1140 05/28/24 0228  WBC 10.1 8.7  RBC 4.93 5.20*  HGB 13.1 13.9  HCT 41.5 44.5  MCV 84.2 85.6  MCH 26.6 26.7  MCHC 31.6 31.2  RDW 13.9 14.0  PLT 259 266    Radiology/Studies:  ECHOCARDIOGRAM COMPLETE Result Date: 05/28/2024    ECHOCARDIOGRAM REPORT   Patient Name:   Emily Key Date of Exam: 05/28/2024 Medical Rec #:  [id number]     Height:       68.0 in Accession #:    [id number]     Weight:       200.0 lb Date of Birth:  May 24, 1971     BSA:          2.044 m Patient Age:    53 years      BP:           173/89 mmHg Patient Gender: F             HR:           55 bpm. Exam Location:  Inpatient Procedure: 2D Echo, Cardiac Doppler and Color Doppler (Both Spectral and Color            Flow Doppler were utilized during procedure). Indications:    Chest Pain R07.9  History:        Patient has prior history of Echocardiogram examinations, most                 recent 03/10/2022. CHF, Previous Myocardial Infarction,                 Signs/Symptoms:Chest Pain; Risk Factors:Hypertension,                 Dyslipidemia and Former Smoker.  Sonographer:    Merlynn Argyle Referring Phys: [id number] CAROLE N HALL IMPRESSIONS  1. Septal hypokinesis. Lateral hypokinesis (base, mid). Left ventricular ejection fraction, by estimation, is 45 to 50%. The left ventricle has mildly decreased function. The left ventricular internal cavity size was severely dilated. Left ventricular diastolic parameters are consistent with Grade I diastolic dysfunction (impaired relaxation).  2.  Right ventricular systolic function is normal. The right ventricular size is normal. There is normal pulmonary artery systolic pressure.  3. A small pericardial effusion is present.  4. The mitral valve is normal in structure. Mild mitral valve regurgitation.  5. The aortic valve is tricuspid. Aortic valve regurgitation is not visualized. Comparison(s): The left ventricular function has improved. FINDINGS  Left Ventricle: Septal hypokinesis. Lateral hypokinesis (base, mid). Left ventricular ejection fraction, by estimation, is 45 to 50%. The left ventricle has mildly decreased function. The left ventricular internal cavity size was severely dilated. There  is no left ventricular hypertrophy. Left ventricular diastolic parameters are consistent with Grade I diastolic dysfunction (impaired relaxation). Right Ventricle: The right ventricular size is normal. Right  vetricular wall thickness was not assessed. Right ventricular systolic function is normal. There is normal pulmonary artery systolic pressure. The tricuspid regurgitant velocity is 2.13 m/s, and with an assumed right atrial pressure of 3 mmHg, the estimated right ventricular systolic pressure is 21.1 mmHg. Left Atrium: Left atrial size was normal in size. Right Atrium: Right atrial size was normal in size. Pericardium: A small pericardial effusion is present. Mitral Valve: The mitral valve is normal in structure. Mild mitral valve regurgitation. Tricuspid Valve: The tricuspid valve is normal in structure. Tricuspid valve regurgitation is trivial. Aortic Valve: The aortic valve is tricuspid. Aortic valve regurgitation is not visualized. Pulmonic Valve: The pulmonic valve was not well visualized. Pulmonic valve regurgitation is not visualized. No evidence of pulmonic stenosis. Aorta: The aortic root and ascending aorta are structurally normal, with no evidence of dilitation. IAS/Shunts: No atrial level shunt detected by color flow Doppler.  LEFT VENTRICLE PLAX 2D LVIDd:         6.70 cm   Diastology LVIDs:         5.80 cm   LV e' medial:    3.89 cm/s LV PW:         0.90 cm   LV E/e' medial:  18.4 LV IVS:        0.70 cm   LV e' lateral:   4.82 cm/s LVOT diam:     2.10 cm   LV E/e' lateral: 14.9 LV SV:         53 LV SV Index:   26 LVOT Area:     3.46 cm LV IVRT:       151 msec  RIGHT VENTRICLE             IVC RV Basal diam:  2.70 cm     IVC diam: 1.20 cm RV S prime:     11.30 cm/s TAPSE (M-mode): 2.5 cm      PULMONARY VEINS                             Diastolic Velocity: 30.60 cm/s                             S/D Velocity:       1.40                             Systolic Velocity:  43.70 cm/s LEFT ATRIUM             Index        RIGHT ATRIUM           Index LA diam:  4.40 cm 2.15 cm/m   RA Area:     14.00 cm LA Vol (A2C):   75.8 ml 37.09 ml/m  RA Volume:   32.60 ml  15.95 ml/m LA Vol (A4C):   55.5 ml 27.16  ml/m LA Biplane Vol: 66.8 ml 32.69 ml/m  AORTIC VALVE LVOT Vmax:   76.40 cm/s LVOT Vmean:  49.800 cm/s LVOT VTI:    0.153 m  AORTA Ao Root diam: 3.30 cm Ao Asc diam:  3.10 cm MITRAL VALVE                  TRICUSPID VALVE MV Area (PHT): 1.74 cm       TR Peak grad:   18.1 mmHg MV Decel Time: 437 msec       TR Vmax:        213.00 cm/s MR Peak grad:    148.4 mmHg MR Mean grad:    87.5 mmHg    SHUNTS MR Vmax:         609.00 cm/s  Systemic VTI:  0.15 m MR Vmean:        435.5 cm/s   Systemic Diam: 2.10 cm MR PISA:         1.57 cm MR PISA Eff ROA: 8 mm MR PISA Radius:  0.50 cm MV E velocity: 71.60 cm/s MV A velocity: 96.60 cm/s MV E/A ratio:  0.74 Vina Gull MD Electronically signed by Vina Gull MD Signature Date/Time: 05/28/2024/2:26:55 PM    Final    CT Angio Chest/Abd/Pel for Dissection W and/or Wo Contrast Result Date: 05/27/2024 EXAM: CTA CHEST, ABDOMEN AND PELVIS WITHOUT AND WITH CONTRAST 05/27/2024 08:06:16 PM TECHNIQUE: CTA of the chest was performed without and with the administration of 100 mL of intravenous iohexol  (OMNIPAQUE ) 350 MG/ML. CTA of the abdomen and pelvis was performed without and with the administration of 100 mL of intravenous iohexol  (OMNIPAQUE ) 350 MG/ML. Multiplanar reformatted images are provided for review. MIP images are provided for review. Automated exposure control, iterative reconstruction, and/or weight based adjustment of the mA/kV was utilized to reduce the radiation dose to as low as reasonably achievable. COMPARISON: Comparison made to 11/19/2021. CLINICAL HISTORY: Acute aortic syndrome (AAS) suspected. FINDINGS: VASCULATURE: AORTA: Mild atherosclerotic calcification within the thoracic aorta. No aortic aneurysm. No thoracic intramural hematoma or dissection. No abdominal aortic aneurysm or dissection. PULMONARY ARTERIES: The central pulmonary arteries are enlarged in keeping with changes of pulmonary arterial hypertension. No pulmonary embolism with the limits of this exam.  GREAT VESSELS OF AORTIC ARCH: Arch vasculature demonstrates classic anatomic configuration and is widely patent proximally. No dissection. No arterial occlusion or significant stenosis. 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 Distally, branch vessels are patent without aneurysm or dissection. SUPERIOR MESENTERIC ARTERY: No acute finding. No occlusion or significant stenosis. 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 Distally widely patent. RENAL ARTERIES: No acute finding. No occlusion or significant stenosis. ILIAC ARTERIES: Mild common iliac atherosclerotic calcification. No occlusion or significant stenosis. CHEST: MEDIASTINUM: Stable mild cardiomegaly with biventricular enlargement. Stable trace pericardial effusion. No significant coronary artery calcifications. No mediastinal lymphadenopathy. LUNGS AND PLEURA: The lungs are without acute process. No focal consolidation or pulmonary edema. No evidence of pleural effusion or pneumothorax. THORACIC BONES AND SOFT TISSUES: Mild right basilar paravertebral fibrotic change. No acute bone abnormality. ABDOMEN AND PELVIS: LIVER: The liver  is unremarkable. GALLBLADDER AND BILE DUCTS: Gallbladder is unremarkable. No biliary ductal dilatation. SPLEEN: The spleen is unremarkable. PANCREAS: The pancreas is unremarkable. ADRENAL GLANDS: Bilateral adrenal glands demonstrate no acute abnormality. KIDNEYS, URETERS AND BLADDER: No obstructing calculi are seen within the visualized upper pole of the right kidney measuring up to 5 mm. No stones in the ureters. No hydronephrosis. No perinephric or periureteral stranding. Urinary bladder is unremarkable. GI AND BOWEL: Stomach and duodenal sweep demonstrate no acute abnormality. There is no bowel obstruction. No abnormal bowel wall thickening or  distension. REPRODUCTIVE: Bilateral tubal ligation clips are noted. PERITONEUM AND RETROPERITONEUM: No ascites or free air. LYMPH NODES: No lymphadenopathy. ABDOMINAL BONES AND SOFT TISSUES: No acute abnormality of the bones. No acute soft tissue abnormality. IMPRESSION: 1. No evidence of acute aortic syndrome. No aortic aneurysm or dissection. 2. Stable mild cardiomegaly with biventricular enlargement and trace pericardial effusion. 3. Enlarged central pulmonary arteries consistent with pulmonary arterial hypertension. No pulmonary embolism. 4. 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 Distal branch vessels are patent without aneurysm or dissection. 5. 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 . Distally widely patent. Electronically signed by: Dorethia Molt MD 05/27/2024 09:09 PM EST RP Workstation: [id number]   DG Chest 2 View Result Date: 05/27/2024 CLINICAL DATA:  Left chest pain and shortness of breath. EXAM: CHEST - 2 VIEW COMPARISON:  01/12/2023 and CT chest 11/19/2021. FINDINGS: Trachea is midline. Heart is at the upper limits of normal in size to mildly enlarged. Lingular scarring. No airspace consolidation or pleural fluid. IMPRESSION: No acute findings. Electronically Signed   By: Newell Eke M.D.   On: 05/27/2024 13:28     Assessment and Plan: Atypical Chest Pain Patient had sudden onset of chest heaviness with any age in the left arm and neck with associated diaphoresis, nausea, and shortness of breath. ECG without acute findings Troponin negative Currently still experiencing chest pain, 5 out of 10. On exam patient has tenderness to chest wall.  At this time do not suspect ACS.  Would recommend continuing to treat MSK pain.  She did deny a history of GERD,  could consider esophageal spasm.  Will defer to primary.  CAD LHC (2022) showed mild nonobstructive disease [45% stenosis to LAD] Chest pain as above Denied anginal chest pain.  DOE could be anginal equivalent, though more likely due to HF see below Will have patient have close outpatient follow-up  Continue ASA 81 mg  Continue coreg  6.25 mg BID, PTA is a higher dose, though agree with lower dose on discharge can titrate outpatient  Chronic systolic and diastolic heart failure with improved EF NICM-hypertensive heart disease Imaging without evidence of volume overload Patient reporting symptoms overall are stable, though has NYHA II symptoms On exam does not appear volume up  Discussion with MD there is concern on evolution of cardiomyopathy on echocardiograms.  At this time recommend a repeat CMR to assess cardiomyopathy. Will place order for tomorrow.   Coreg  as above Restart entresto  49/51  Restart farxiga  10  Restart hydralazine  100 mg Start isosorbide  dinitrate 30 mg TID  Hypertension BP: 142/126 Medications as above  Hyperlipidemia  Lipid panel ordered for am Continue lipitor 20 mg  MR Mild per echo this admission.  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

## 2024-05-28 NOTE — Progress Notes (Signed)
 Echocardiogram 2D Echocardiogram has been performed.  Emily Key 05/28/2024, 10:06 AM

## 2024-05-28 NOTE — ED Notes (Signed)
 Echo at bedside

## 2024-05-28 NOTE — ED Notes (Addendum)
 SABRA

## 2024-05-29 ENCOUNTER — Other Ambulatory Visit (HOSPITAL_COMMUNITY): Payer: Self-pay

## 2024-05-29 ENCOUNTER — Telehealth (HOSPITAL_COMMUNITY): Payer: Self-pay | Admitting: Internal Medicine

## 2024-05-29 DIAGNOSIS — I10 Essential (primary) hypertension: Secondary | ICD-10-CM | POA: Diagnosis not present

## 2024-05-29 DIAGNOSIS — I1A0 Resistant hypertension: Secondary | ICD-10-CM | POA: Diagnosis not present

## 2024-05-29 DIAGNOSIS — I428 Other cardiomyopathies: Secondary | ICD-10-CM | POA: Diagnosis not present

## 2024-05-29 DIAGNOSIS — I5042 Chronic combined systolic (congestive) and diastolic (congestive) heart failure: Secondary | ICD-10-CM | POA: Diagnosis not present

## 2024-05-29 DIAGNOSIS — R0789 Other chest pain: Secondary | ICD-10-CM | POA: Diagnosis not present

## 2024-05-29 LAB — COMPREHENSIVE METABOLIC PANEL WITH GFR
ALT: 11 U/L (ref 0–44)
AST: 18 U/L (ref 15–41)
Albumin: 3.7 g/dL (ref 3.5–5.0)
Alkaline Phosphatase: 53 U/L (ref 38–126)
Anion gap: 9 (ref 5–15)
BUN: 16 mg/dL (ref 6–20)
CO2: 25 mmol/L (ref 22–32)
Calcium: 9 mg/dL (ref 8.9–10.3)
Chloride: 103 mmol/L (ref 98–111)
Creatinine, Ser: 1 mg/dL (ref 0.44–1.00)
GFR, Estimated: >60 mL/min (ref >60)
Glucose, Bld: 96 mg/dL (ref 70–99)
Potassium: 3.6 mmol/L (ref 3.5–5.1)
Sodium: 137 mmol/L (ref 135–145)
Total Bilirubin: 0.9 mg/dL (ref 0.0–1.2)
Total Protein: 6.9 g/dL (ref 6.5–8.1)

## 2024-05-29 LAB — PHOSPHORUS: Phosphorus: 3.9 mg/dL (ref 2.5–4.6)

## 2024-05-29 LAB — CBC WITH DIFFERENTIAL/PLATELET
Abs Immature Granulocytes: 0.02 K/uL (ref 0.00–0.07)
Basophils Absolute: 0 K/uL (ref 0.0–0.1)
Basophils Relative: 1 %
Eosinophils Absolute: 0.3 K/uL (ref 0.0–0.5)
Eosinophils Relative: 5 %
HCT: 42.8 % (ref 36.0–46.0)
Hemoglobin: 13.4 g/dL (ref 12.0–15.0)
Immature Granulocytes: 0 %
Lymphocytes Relative: 31 %
Lymphs Abs: 2.1 K/uL (ref 0.7–4.0)
MCH: 26.5 pg (ref 26.0–34.0)
MCHC: 31.3 g/dL (ref 30.0–36.0)
MCV: 84.8 fL (ref 80.0–100.0)
Monocytes Absolute: 0.5 K/uL (ref 0.1–1.0)
Monocytes Relative: 7 %
Neutro Abs: 4 K/uL (ref 1.7–7.7)
Neutrophils Relative %: 56 %
Platelets: 244 K/uL (ref 150–400)
RBC: 5.05 MIL/uL (ref 3.87–5.11)
RDW: 14 % (ref 11.5–15.5)
WBC: 6.9 K/uL (ref 4.0–10.5)
nRBC: 0 % (ref 0.0–0.2)

## 2024-05-29 LAB — LIPID PANEL
Cholesterol: 197 mg/dL (ref 0–200)
HDL: 56 mg/dL (ref >40)
LDL Cholesterol: 123 mg/dL — ABNORMAL HIGH (ref 0–99)
Total CHOL/HDL Ratio: 3.5 ratio
Triglycerides: 89 mg/dL (ref <150)
VLDL: 18 mg/dL (ref 0–40)

## 2024-05-29 LAB — MISC LABCORP TEST (SEND OUT): Labcorp test code: 83935

## 2024-05-29 LAB — MAGNESIUM: Magnesium: 2 mg/dL (ref 1.7–2.4)

## 2024-05-29 MED ORDER — CARVEDILOL 3.125 MG PO TABS
6.2500 mg | ORAL_TABLET | Freq: Once | ORAL | Status: AC
  Administered 2024-05-29: 6.25 mg via ORAL
  Filled 2024-05-29: qty 2

## 2024-05-29 MED ORDER — SACUBITRIL-VALSARTAN 97-103 MG PO TABS
1.0000 | ORAL_TABLET | Freq: Two times a day (BID) | ORAL | Status: DC
  Filled 2024-05-29: qty 1

## 2024-05-29 MED ORDER — AMLODIPINE BESYLATE 5 MG PO TABS
10.0000 mg | ORAL_TABLET | Freq: Every day | ORAL | Status: DC
  Administered 2024-05-29: 10 mg via ORAL
  Filled 2024-05-29: qty 2

## 2024-05-29 MED ORDER — SACUBITRIL-VALSARTAN 49-51 MG PO TABS
1.0000 | ORAL_TABLET | Freq: Once | ORAL | Status: AC
  Administered 2024-05-29: 1 via ORAL
  Filled 2024-05-29: qty 1

## 2024-05-29 MED ORDER — HYDRALAZINE HCL 25 MG PO TABS: 100.0000 mg | ORAL_TABLET | Freq: Three times a day (TID) | ORAL | Status: DC

## 2024-05-29 MED ORDER — AMLODIPINE BESYLATE 10 MG PO TABS
10.0000 mg | ORAL_TABLET | Freq: Every day | ORAL | 1 refills | Status: AC
  Filled 2024-05-29: qty 30, 30d supply, fill #0

## 2024-05-29 MED ORDER — SACUBITRIL-VALSARTAN 97-103 MG PO TABS
1.0000 | ORAL_TABLET | Freq: Two times a day (BID) | ORAL | 1 refills | Status: AC
  Filled 2024-05-29: qty 60, 30d supply, fill #0

## 2024-05-29 MED ORDER — HYDRALAZINE HCL 25 MG PO TABS
50.0000 mg | ORAL_TABLET | Freq: Once | ORAL | Status: AC
  Administered 2024-05-29: 50 mg via ORAL
  Filled 2024-05-29: qty 2

## 2024-05-29 MED ORDER — CARVEDILOL 12.5 MG PO TABS: 12.5000 mg | ORAL_TABLET | Freq: Two times a day (BID) | ORAL | Status: DC

## 2024-05-29 MED ORDER — DICLOFENAC SODIUM 1 % EX GEL
2.0000 g | Freq: Four times a day (QID) | CUTANEOUS | 0 refills | Status: AC
  Filled 2024-05-29: qty 100, 12d supply, fill #0

## 2024-05-29 MED ORDER — ASPIRIN 81 MG PO TBEC
81.0000 mg | DELAYED_RELEASE_TABLET | Freq: Every day | ORAL | 1 refills | Status: AC
  Filled 2024-05-29: qty 30, 30d supply, fill #0

## 2024-05-29 MED ORDER — HYDRALAZINE HCL 25 MG PO TABS: 25.0000 mg | ORAL_TABLET | Freq: Four times a day (QID) | ORAL | Status: DC | PRN

## 2024-05-29 NOTE — Discharge Summary (Signed)
 Physician Discharge Summary   Patient: Emily Key MRN: [id number] DOB: 04/30/1971  Admit date:     05/27/2024  Discharge date: 05/29/24  Discharge Physician: Sabas GORMAN Brod   PCP: Paseda, Folashade R, FNP   Recommendations at discharge:   Follow-up PCP in 2 weeks Follow-up heart failure clinic on 05/30/2024  Discharge Diagnoses: Principal Problem:   Musculoskeletal chest pain Active Problems:   Chronic combined systolic and diastolic heart failure (HCC)   Non-ischemic cardiomyopathy (HCC)   Non compliance w medication regimen  Resolved Problems:   * No resolved hospital problems. *  Hospital Course: 53 y.o. 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   Assessment and Plan:  Atypical chest pain CAD Component of reproducibility Troponin negative x2 Echo with septal hypokinesis, lateral hypokinesis, EF 45-50% Improved after starting diclofenac topical cream 2 g 4 times a day; will discharge on topical diclofenac for 3 more days Aspirin , statin Cardiology was consulted, deemed as musculoskeletal pain. -No further workup recommended in the hospital. -Patient to follow-up with heart failure clinic on 05/30/2024.   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   Distal branch vessels are patent without aneurysm or dissection.  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  Distally widely patent. Follow with outpatient, asx   Prediabetes Last hemoglobin A1c 6.0 on 01/24/2023 Diet controlled          Consultants: Cardiology Procedures performed: Echocardiogram Disposition: Home Diet recommendation:  Discharge Diet Orders (From admission, onward)     Start     Ordered   05/29/24 0000  Diet - low sodium heart healthy        05/29/24 1137           Cardiac diet DISCHARGE MEDICATION: Allergies as of 05/29/2024   No Known Allergies      Medication List     STOP taking these medications    sacubitril -valsartan  49-51 MG Commonly known as: Entresto  Replaced by: sacubitril -valsartan  97-103 MG       TAKE these medications    acetaminophen  325 MG tablet Commonly known as: TYLENOL  Take 650 mg by mouth every 6 (six) hours as needed for mild pain (pain score 1-3) or moderate pain (pain score 4-6).   amLODipine  10 MG tablet Commonly known as: NORVASC  Take 1 tablet (10 mg total) by mouth daily. Start taking on: May 30, 2024 What changed:  medication strength how much to take   aspirin  EC 81 MG tablet Take 1 tablet (81 mg total) by mouth daily. Swallow whole. Start taking on: May 30, 2024   atorvastatin  20 MG tablet Commonly known as: LIPITOR Take 1 tablet (20 mg total) by mouth daily.   carvedilol  12.5 MG tablet Commonly known as: COREG  Take 1 tablet (12.5 mg total)  by mouth 2 (two) times daily with a meal.   Farxiga  10 MG Tabs tablet Generic drug: dapagliflozin  propanediol Take 1 tablet (10 mg total) by mouth daily.   furosemide  20 MG tablet Commonly known as: LASIX  Take 1 tablet (20 mg total) by mouth as needed. For weight gain 3 lbs in 24 hours or 5 lbs in a week PLEASE SCHEDULE  APPOINTMENT FOR MORE REFILLS   hydrALAZINE  100 MG tablet Commonly known as: APRESOLINE  Take 1 tablet (100 mg total) by mouth 3 (three) times daily.   isosorbide  mononitrate 30 MG 24 hr tablet Commonly known as: IMDUR  Take 2 tablets (60 mg total) by mouth daily.   sacubitril -valsartan  97-103 MG Commonly known as: ENTRESTO  Take 1 tablet by mouth 2 (two) times daily. Replaces: sacubitril -valsartan  49-51 MG        Follow-up Information     Bensimhon, Toribio SAUNDERS, MD Follow up on 05/30/2024.   Specialty: Cardiology Why: Call to conform appointment Contact information: [address] [place] [address] [phone]                Discharge Exam: Fredricka Weights   05/27/24 1121  Weight: 90.7 kg   General-appears in no acute distress Heart-S1-S2, regular, no murmur auscultated Lungs-clear to auscultation bilaterally, no wheezing or crackles auscultated Abdomen-soft, nontender, no organomegaly Extremities-no edema in the lower extremities Neuro-alert, oriented x3, no focal deficit noted  Condition at discharge: good  The results of significant diagnostics from this hospitalization (including imaging, microbiology, ancillary and laboratory) are listed below for reference.   Imaging Studies: MR CARDIAC MORPHOLOGY W WO CONTRAST Result Date: 05/29/2024 CLINICAL DATA:  53F with nonobstructive CAD, HFrEF. Echo with EF 45-50% EXAM: CARDIAC MRI TECHNIQUE: The patient was scanned on a 1.5 Tesla Siemens magnet. A dedicated cardiac coil was used. Functional imaging was done using Fiesta sequences. 2,3, and 4 chamber views were done to assess for RWMA's. Modified Simpson's rule using a short axis stack was used to calculate an ejection fraction on a dedicated work [employer]. The patient received 10 cc of Gadavist . After 10 minutes inversion recovery sequences were used to assess for infiltration and scar tissue. Phase contrast velocity mapping was  performed above the aortic and pulmonic valves CONTRAST:  10 cc  of Gadavist  FINDINGS: Left ventricle: -Mild dilatation -Moderate concentric hypertrophy -Moderate systolic dysfunction -ELevated ECV (32%) -Normal T2 values -RV insertion site LGE -Patchy midwall basal to mid lateral wall LGE LV EF:  39% (Normal 52-79%) Absolute volumes: LV EDV: (Normal 78-167 mL) LV ESV: (Normal 21-64 mL) LV SV: 87mL (Normal 52-114 mL) CO: 5.7L/min (Normal 2.7-6.3 L/min) Indexed volumes: LV EDV: 163mL/sq-m (Normal 50-96 mL/sq-m) LV ESV: 45mL/sq-m (Normal 10-40 mL/sq-m) LV SV: 77mL/sq-m (Normal 33-64 mL/sq-m) CI: 2.8L/min/sq-m (Normal 1.9-3.9 L/min/sq-m) Right ventricle: Normal size and systolic function RV EF: 57% (Normal 52-80%) Absolute volumes: RV EDV: (Normal 79-175 mL) RV ESV: 57mL (Normal 13-75 mL) RV SV: 75mL (Normal 56-110 mL) CO: 4.9L/min (Normal 2.7-6 L/min) Indexed volumes: RV EDV: 39mL/sq-m (Normal 51-97 mL/sq-m) RV ESV: 49mL/sq-m (Normal 9-42 mL/sq-m) RV SV: 65mL/sq-m (Normal 35-61 mL/sq-m) CI: 2.4L/min/sq-m (Normal 1.8-3.8 L/min/sq-m) Left atrium: Mild enlargement Right atrium: Normal size Mitral valve: Mild regurgitation (regurgitant fraction 15%) Aortic valve: Tricuspid.  Trivial regurgitation Tricuspid valve: Trivial regurgitation Pulmonic valve: Trivial regurgitation Aorta: Normal proximal ascending aorta Pulmonary artery: Dilated main pulmonary artery measures 31mm Pericardium: Small effusion IMPRESSION: 1. Mild LV dilatation, moderate concentric hypertrophy, and moderate systolic dysfunction (  EF 39%) 2.  Normal RV size and systolic function (EF 57%) 3. Basal to mid lateral wall patchy midwall LGE. This is a nonsystemic scar pattern. Also with RV insertion site LGE, which is a nonspecific scar pattern commonly seen in setting of elevated pulmonary pressures. Suspect likely a component of hypertensive heart disease with her concentric LVH, but given LGE pattern differential diagnosis would also  include prior myocarditis, sarcoidosis, or idiopathic dilated cardiomyopathy 4. Mild mitral regurgitation (regurgitant fraction 15%) 5. Dilated main pulmonary artery measures 31mm Electronically Signed   By: Lonni Nanas M.D.   On: 05/29/2024 10:03   MR CARDIAC VELOCITY FLOW MAP Result Date: 05/29/2024 CLINICAL DATA:  73F with nonobstructive CAD, HFrEF. Echo with EF 45-50% EXAM: CARDIAC MRI TECHNIQUE: The patient was scanned on a 1.5 Tesla Siemens magnet. A dedicated cardiac coil was used. Functional imaging was done using Fiesta sequences. 2,3, and 4 chamber views were done to assess for RWMA's. Modified Simpson's rule using a short axis stack was used to calculate an ejection fraction on a dedicated work [employer]. The patient received 10 cc of Gadavist . After 10 minutes inversion recovery sequences were used to assess for infiltration and scar tissue. Phase contrast velocity mapping was performed above the aortic and pulmonic valves CONTRAST:  10 cc  of Gadavist  FINDINGS: Left ventricle: -Mild dilatation -Moderate concentric hypertrophy -Moderate systolic dysfunction -ELevated ECV (32%) -Normal T2 values -RV insertion site LGE -Patchy midwall basal to mid lateral wall LGE LV EF:  39% (Normal 52-79%) Absolute volumes: LV EDV: (Normal 78-167 mL) LV ESV: (Normal 21-64 mL) LV SV: 87mL (Normal 52-114 mL) CO: 5.7L/min (Normal 2.7-6.3 L/min) Indexed volumes: LV EDV: 136mL/sq-m (Normal 50-96 mL/sq-m) LV ESV: 38mL/sq-m (Normal 10-40 mL/sq-m) LV SV: 84mL/sq-m (Normal 33-64 mL/sq-m) CI: 2.8L/min/sq-m (Normal 1.9-3.9 L/min/sq-m) Right ventricle: Normal size and systolic function RV EF: 57% (Normal 52-80%) Absolute volumes: RV EDV: (Normal 79-175 mL) RV ESV: 57mL (Normal 13-75 mL) RV SV: 75mL (Normal 56-110 mL) CO: 4.9L/min (Normal 2.7-6 L/min) Indexed volumes: RV EDV: 59mL/sq-m (Normal 51-97 mL/sq-m) RV ESV: 31mL/sq-m (Normal 9-42 mL/sq-m) RV SV: 50mL/sq-m (Normal 35-61  mL/sq-m) CI: 2.4L/min/sq-m (Normal 1.8-3.8 L/min/sq-m) Left atrium: Mild enlargement Right atrium: Normal size Mitral valve: Mild regurgitation (regurgitant fraction 15%) Aortic valve: Tricuspid.  Trivial regurgitation Tricuspid valve: Trivial regurgitation Pulmonic valve: Trivial regurgitation Aorta: Normal proximal ascending aorta Pulmonary artery: Dilated main pulmonary artery measures 31mm Pericardium: Small effusion IMPRESSION: 1. Mild LV dilatation, moderate concentric hypertrophy, and moderate systolic dysfunction (EF 39%) 2.  Normal RV size and systolic function (EF 57%) 3. Basal to mid lateral wall patchy midwall LGE. This is a nonsystemic scar pattern. Also with RV insertion site LGE, which is a nonspecific scar pattern commonly seen in setting of elevated pulmonary pressures. Suspect likely a component of hypertensive heart disease with her concentric LVH, but given LGE pattern differential diagnosis would also include prior myocarditis, sarcoidosis, or idiopathic dilated cardiomyopathy 4. Mild mitral regurgitation (regurgitant fraction 15%) 5. Dilated main pulmonary artery measures 31mm Electronically Signed   By: Lonni Nanas M.D.   On: 05/29/2024 10:03   MR CARDIAC VELOCITY FLOW MAP Result Date: 05/29/2024 CLINICAL DATA:  73F with nonobstructive CAD, HFrEF. Echo with EF 45-50% EXAM: CARDIAC MRI TECHNIQUE: The patient was scanned on a 1.5 Tesla Siemens magnet. A dedicated cardiac coil was used. Functional imaging was done using Fiesta sequences. 2,3, and 4 chamber views were done to assess for RWMA's. Modified Simpson's rule using  a short axis stack was used to calculate an ejection fraction on a dedicated work [employer]. The patient received 10 cc of Gadavist . After 10 minutes inversion recovery sequences were used to assess for infiltration and scar tissue. Phase contrast velocity mapping was performed above the aortic and pulmonic valves CONTRAST:  10 cc  of Gadavist   FINDINGS: Left ventricle: -Mild dilatation -Moderate concentric hypertrophy -Moderate systolic dysfunction -ELevated ECV (32%) -Normal T2 values -RV insertion site LGE -Patchy midwall basal to mid lateral wall LGE LV EF:  39% (Normal 52-79%) Absolute volumes: LV EDV: (Normal 78-167 mL) LV ESV: (Normal 21-64 mL) LV SV: 87mL (Normal 52-114 mL) CO: 5.7L/min (Normal 2.7-6.3 L/min) Indexed volumes: LV EDV: 134mL/sq-m (Normal 50-96 mL/sq-m) LV ESV: 55mL/sq-m (Normal 10-40 mL/sq-m) LV SV: 33mL/sq-m (Normal 33-64 mL/sq-m) CI: 2.8L/min/sq-m (Normal 1.9-3.9 L/min/sq-m) Right ventricle: Normal size and systolic function RV EF: 57% (Normal 52-80%) Absolute volumes: RV EDV: (Normal 79-175 mL) RV ESV: 57mL (Normal 13-75 mL) RV SV: 75mL (Normal 56-110 mL) CO: 4.9L/min (Normal 2.7-6 L/min) Indexed volumes: RV EDV: 45mL/sq-m (Normal 51-97 mL/sq-m) RV ESV: 97mL/sq-m (Normal 9-42 mL/sq-m) RV SV: 50mL/sq-m (Normal 35-61 mL/sq-m) CI: 2.4L/min/sq-m (Normal 1.8-3.8 L/min/sq-m) Left atrium: Mild enlargement Right atrium: Normal size Mitral valve: Mild regurgitation (regurgitant fraction 15%) Aortic valve: Tricuspid.  Trivial regurgitation Tricuspid valve: Trivial regurgitation Pulmonic valve: Trivial regurgitation Aorta: Normal proximal ascending aorta Pulmonary artery: Dilated main pulmonary artery measures 31mm Pericardium: Small effusion IMPRESSION: 1. Mild LV dilatation, moderate concentric hypertrophy, and moderate systolic dysfunction (EF 39%) 2.  Normal RV size and systolic function (EF 57%) 3. Basal to mid lateral wall patchy midwall LGE. This is a nonsystemic scar pattern. Also with RV insertion site LGE, which is a nonspecific scar pattern commonly seen in setting of elevated pulmonary pressures. Suspect likely a component of hypertensive heart disease with her concentric LVH, but given LGE pattern differential diagnosis would also include prior myocarditis, sarcoidosis, or idiopathic dilated cardiomyopathy 4.  Mild mitral regurgitation (regurgitant fraction 15%) 5. Dilated main pulmonary artery measures 31mm Electronically Signed   By: Lonni Nanas M.D.   On: 05/29/2024 10:03   ECHOCARDIOGRAM COMPLETE Result Date: 05/28/2024    ECHOCARDIOGRAM REPORT   Patient Name:   Emily Key Date of Exam: 05/28/2024 Medical Rec #:  [id number]     Height:       68.0 in Accession #:    [id number]    Weight:       200.0 lb Date of Birth:  1970/08/23     BSA:          2.044 m Patient Age:    53 years      BP:           173/89 mmHg Patient Gender: F             HR:           55 bpm. Exam Location:  Inpatient Procedure: 2D Echo, Cardiac Doppler and Color Doppler (Both Spectral and Color            Flow Doppler were utilized during procedure). Indications:    Chest Pain R07.9  History:        Patient has prior history of Echocardiogram examinations, most                 recent 03/10/2022. CHF, Previous Myocardial Infarction,                 Signs/Symptoms:Chest Pain;  Risk Factors:Hypertension,                 Dyslipidemia and Former Smoker.  Sonographer:    Merlynn Argyle Referring Phys: [id number] CAROLE N HALL IMPRESSIONS  1. Septal hypokinesis. Lateral hypokinesis (base, mid). Left ventricular ejection fraction, by estimation, is 45 to 50%. The left ventricle has mildly decreased function. The left ventricular internal cavity size was severely dilated. Left ventricular diastolic parameters are consistent with Grade I diastolic dysfunction (impaired relaxation).  2. Right ventricular systolic function is normal. The right ventricular size is normal. There is normal pulmonary artery systolic pressure.  3. A small pericardial effusion is present.  4. The mitral valve is normal in structure. Mild mitral valve regurgitation.  5. The aortic valve is tricuspid. Aortic valve regurgitation is not visualized. Comparison(s): The left ventricular function has improved. FINDINGS  Left Ventricle: Septal hypokinesis. Lateral hypokinesis (base,  mid). Left ventricular ejection fraction, by estimation, is 45 to 50%. The left ventricle has mildly decreased function. The left ventricular internal cavity size was severely dilated. There  is no left ventricular hypertrophy. Left ventricular diastolic parameters are consistent with Grade I diastolic dysfunction (impaired relaxation). Right Ventricle: The right ventricular size is normal. Right vetricular wall thickness was not assessed. Right ventricular systolic function is normal. There is normal pulmonary artery systolic pressure. The tricuspid regurgitant velocity is 2.13 m/s, and with an assumed right atrial pressure of 3 mmHg, the estimated right ventricular systolic pressure is 21.1 mmHg. Left Atrium: Left atrial size was normal in size. Right Atrium: Right atrial size was normal in size. Pericardium: A small pericardial effusion is present. Mitral Valve: The mitral valve is normal in structure. Mild mitral valve regurgitation. Tricuspid Valve: The tricuspid valve is normal in structure. Tricuspid valve regurgitation is trivial. Aortic Valve: The aortic valve is tricuspid. Aortic valve regurgitation is not visualized. Pulmonic Valve: The pulmonic valve was not well visualized. Pulmonic valve regurgitation is not visualized. No evidence of pulmonic stenosis. Aorta: The aortic root and ascending aorta are structurally normal, with no evidence of dilitation. IAS/Shunts: No atrial level shunt detected by color flow Doppler.  LEFT VENTRICLE PLAX 2D LVIDd:         6.70 cm   Diastology LVIDs:         5.80 cm   LV e' medial:    3.89 cm/s LV PW:         0.90 cm   LV E/e' medial:  18.4 LV IVS:        0.70 cm   LV e' lateral:   4.82 cm/s LVOT diam:     2.10 cm   LV E/e' lateral: 14.9 LV SV:         53 LV SV Index:   26 LVOT Area:     3.46 cm LV IVRT:       151 msec  RIGHT VENTRICLE             IVC RV Basal diam:  2.70 cm     IVC diam: 1.20 cm RV S prime:     11.30 cm/s TAPSE (M-mode): 2.5 cm      PULMONARY VEINS                              Diastolic Velocity: 30.60 cm/s  S/D Velocity:       1.40                             Systolic Velocity:  43.70 cm/s LEFT ATRIUM             Index        RIGHT ATRIUM           Index LA diam:        4.40 cm 2.15 cm/m   RA Area:     14.00 cm LA Vol (A2C):   75.8 ml 37.09 ml/m  RA Volume:   32.60 ml  15.95 ml/m LA Vol (A4C):   55.5 ml 27.16 ml/m LA Biplane Vol: 66.8 ml 32.69 ml/m  AORTIC VALVE LVOT Vmax:   76.40 cm/s LVOT Vmean:  49.800 cm/s LVOT VTI:    0.153 m  AORTA Ao Root diam: 3.30 cm Ao Asc diam:  3.10 cm MITRAL VALVE                  TRICUSPID VALVE MV Area (PHT): 1.74 cm       TR Peak grad:   18.1 mmHg MV Decel Time: 437 msec       TR Vmax:        213.00 cm/s MR Peak grad:    148.4 mmHg MR Mean grad:    87.5 mmHg    SHUNTS MR Vmax:         609.00 cm/s  Systemic VTI:  0.15 m MR Vmean:        435.5 cm/s   Systemic Diam: 2.10 cm MR PISA:         1.57 cm MR PISA Eff ROA: 8 mm MR PISA Radius:  0.50 cm MV E velocity: 71.60 cm/s MV A velocity: 96.60 cm/s MV E/A ratio:  0.74 Vina Gull MD Electronically signed by Vina Gull MD Signature Date/Time: 05/28/2024/2:26:55 PM    Final    CT Angio Chest/Abd/Pel for Dissection W and/or Wo Contrast Result Date: 05/27/2024 EXAM: CTA CHEST, ABDOMEN AND PELVIS WITHOUT AND WITH CONTRAST 05/27/2024 08:06:16 PM TECHNIQUE: CTA of the chest was performed without and with the administration of 100 mL of intravenous iohexol  (OMNIPAQUE ) 350 MG/ML. CTA of the abdomen and pelvis was performed without and with the administration of 100 mL of intravenous iohexol  (OMNIPAQUE ) 350 MG/ML. Multiplanar reformatted images are provided for review. MIP images are provided for review. Automated exposure control, iterative reconstruction, and/or weight based adjustment of the mA/kV was utilized to reduce the radiation dose to as low as reasonably achievable. COMPARISON: Comparison made to 11/19/2021. CLINICAL HISTORY: Acute aortic syndrome  (AAS) suspected. FINDINGS: VASCULATURE: AORTA: Mild atherosclerotic calcification within the thoracic aorta. No aortic aneurysm. No thoracic intramural hematoma or dissection. No abdominal aortic aneurysm or dissection. PULMONARY ARTERIES: The central pulmonary arteries are enlarged in keeping with changes of pulmonary arterial hypertension. No pulmonary embolism with the limits of this exam. GREAT VESSELS OF AORTIC ARCH: Arch vasculature demonstrates classic anatomic configuration and is widely patent proximally. No dissection. No arterial occlusion or significant stenosis. 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 Distally, branch vessels are patent without aneurysm or dissection. SUPERIOR MESENTERIC ARTERY: No acute finding. No occlusion or significant stenosis. 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 Distally widely patent.  RENAL ARTERIES: No acute finding. No occlusion or significant stenosis. ILIAC ARTERIES: Mild common iliac atherosclerotic calcification. No occlusion or significant stenosis. CHEST: MEDIASTINUM: Stable mild cardiomegaly with biventricular enlargement. Stable trace pericardial effusion. No significant coronary artery calcifications. No mediastinal lymphadenopathy. LUNGS AND PLEURA: The lungs are without acute process. No focal consolidation or pulmonary edema. No evidence of pleural effusion or pneumothorax. THORACIC BONES AND SOFT TISSUES: Mild right basilar paravertebral fibrotic change. No acute bone abnormality. ABDOMEN AND PELVIS: LIVER: The liver is unremarkable. GALLBLADDER AND BILE DUCTS: Gallbladder is unremarkable. No biliary ductal dilatation. SPLEEN: The spleen is unremarkable. PANCREAS: The pancreas is unremarkable. ADRENAL GLANDS: Bilateral adrenal glands demonstrate no acute abnormality. KIDNEYS,  URETERS AND BLADDER: No obstructing calculi are seen within the visualized upper pole of the right kidney measuring up to 5 mm. No stones in the ureters. No hydronephrosis. No perinephric or periureteral stranding. Urinary bladder is unremarkable. GI AND BOWEL: Stomach and duodenal sweep demonstrate no acute abnormality. There is no bowel obstruction. No abnormal bowel wall thickening or distension. REPRODUCTIVE: Bilateral tubal ligation clips are noted. PERITONEUM AND RETROPERITONEUM: No ascites or free air. LYMPH NODES: No lymphadenopathy. ABDOMINAL BONES AND SOFT TISSUES: No acute abnormality of the bones. No acute soft tissue abnormality. IMPRESSION: 1. No evidence of acute aortic syndrome. No aortic aneurysm or dissection. 2. Stable mild cardiomegaly with biventricular enlargement and trace pericardial effusion. 3. Enlarged central pulmonary arteries consistent with pulmonary arterial hypertension. No pulmonary embolism. 4. 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 Distal branch vessels are patent without aneurysm or dissection. 5. 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 . Distally widely patent. Electronically signed by: Dorethia Molt MD 05/27/2024 09:09 PM EST RP Workstation: [id number]   DG Chest 2 View Result Date: 05/27/2024 CLINICAL DATA:  Left chest pain and shortness of breath. EXAM: CHEST - 2 VIEW COMPARISON:  01/12/2023 and CT chest 11/19/2021. FINDINGS: Trachea is midline. Heart is at the upper limits of normal in size to mildly enlarged. Lingular scarring. No airspace consolidation or pleural fluid. IMPRESSION: No acute findings. Electronically Signed   By: Newell Eke M.D.   On: 05/27/2024 13:28    Microbiology: Results for orders placed or performed during the  hospital encounter of 12/21/20  SARS CORONAVIRUS 2 (TAT 6-24 HRS) Nasopharyngeal Nasopharyngeal Swab     Status: None   Collection Time: 12/23/20  7:09 AM   Specimen: Nasopharyngeal Swab  Result Value Ref Range Status   SARS Coronavirus 2 NEGATIVE NEGATIVE Final    Comment: (NOTE) SARS-CoV-2 target nucleic acids are NOT DETECTED.  The SARS-CoV-2 RNA is generally detectable in upper and lower respiratory specimens during the acute phase of infection. Negative results do not preclude SARS-CoV-2 infection, do not rule out co-infections with other pathogens, and should not be used as the sole basis for treatment or other patient management decisions. Negative results must be combined with clinical observations, patient history, and epidemiological information. The expected result is Negative.  Fact Sheet for Patients: hairslick.no  Fact Sheet for Healthcare Providers: [email or web address]  This test is not yet approved or cleared by the [place]  FDA and  has been authorized for detection and/or diagnosis of SARS-CoV-2 by FDA under an Emergency Use Authorization (EUA). This EUA will remain  in effect (meaning this test can be used) for the duration of  the COVID-19 declaration under Se ction 564(b)(1) of the Act, 21 U.S.C. section 360bbb-3(b)(1), unless the authorization is terminated or revoked sooner.  Performed at [facility], [address], [place], [place] [address]     Labs: CBC: Recent Labs  Lab 05/27/24 1140 05/28/24 0228 05/29/24 0216  WBC 10.1 8.7 6.9  NEUTROABS 7.8*  --  4.0  HGB 13.1 13.9 13.4  HCT 41.5 44.5 42.8  MCV 84.2 85.6 84.8  PLT 259 266 244   Basic Metabolic Panel: Recent Labs  Lab 05/27/24 1140 05/28/24 0359 05/29/24 0216  NA 136 138 137  K 3.9 3.5 3.6  CL 103 104 103  CO2 22 25 25   GLUCOSE 143* 121* 96  BUN 13 12 16   CREATININE 0.91 0.83 1.00  CALCIUM  9.1 9.2 9.0  MG  --   2.3 2.0  PHOS  --  4.3 3.9   Liver Function Tests: Recent Labs  Lab 05/27/24 1140 05/29/24 0216  AST 21 18  ALT 14 11  ALKPHOS 55 53  BILITOT 0.6 0.9  PROT 7.4 6.9  ALBUMIN  3.8 3.7   CBG: No results for input(s): GLUCAP in the last 168 hours.  Discharge time spent: greater than 30 minutes.  Signed: Sabas GORMAN Brod, MD [facility] 05/29/2024

## 2024-05-29 NOTE — ED Notes (Signed)
 Md notified and aware of blood pressure 167/97 and heart rate of 57, per md pt is ok for more blood pressure meds.

## 2024-05-29 NOTE — Progress Notes (Signed)
 Heart Failure Navigator Progress Note  Patient of Dr. Cherrie. Last seen in 01/2023. Scheduled appt with HF clinic for tomorrow, 11/20 with Dr. Bensimhon as he had an opening in his schedule.   Duwaine Plant, PharmD, BCPS Heart Failure Stewardship Pharmacist Phone [phone]

## 2024-05-29 NOTE — Progress Notes (Signed)
 Patient Name: Emily Key Date of Encounter: 05/29/2024 [facility] Cardiologist: Toribio Fuel, MD  05/27/2024 .admit Length of stay: 0  Interval Summary  .    Emily Key is a 53 y.o.  female with a hx of hypertension, hyperlipidemia, non-obstructive CAD [facility] in 2022 showed 45% stenosis to LAD} chronic systolic and diastolic heart failure [imEF 20-25% in 2022 and EF 55-60% in 2023], pulmonary hypertension, hx of L VATS w/ drainage of empyema with abscess and decortication in 2016, and hx of medication non-compliance admitted to the hospital with musculoskeletal chest pain.  She has no significant coronary disease by cardiac catheterization 2022, cardiac MR performed 12/25/2020 for severe LVH and speckled pattern on echocardiogram had revealed hypertensive cardiomyopathy and pulm hypertension on 12/25/2020 with markedly reduced LVEF at 22%.  Her EF had improved to around 35% to 40%, echocardiogram done yesterday on 05/28/2024 revealed EF 45 to 50% however personally reviewed, EF appears to be 30% to 35% at most with global hypokinesis and septal wall motion abnormality consistent with bundle branch block.  Could not exclude hypokinesis.  Hence with suggestion of infiltrative cardiomyopathy by prior echocardiogram and now echocardiogram revealing moderate to marked dilatation of the LV with global hypokinesis, no suggestive of infiltrative cardiomyopathy, acute myocarditis in 2022 was thought about, patient now scheduled for repeat cardiac MR which she underwent this morning and results are pending.  Patient overall feels well, still has chest pain when she takes a deep breath in the left upper side of the chest and it is very focal.  States that she will make changes and will be compliant with medications.  Physical Exam    Vitals:   05/29/24 0615 05/29/24 0619 05/29/24 0640 05/29/24 0726  BP: (!) 191/93  (!) 186/105 (!) 210/92  Pulse: 62  (!) 53 62  Resp: (!) 22  11 16    Temp:  98.3 F (36.8 C)  97.6 F (36.4 C)  TempSrc:  Oral  Oral  SpO2: 97%  93% 99%  Weight:      Height:       Physical Exam Neck:     Vascular: No carotid bruit or JVD.  Cardiovascular:     Rate and Rhythm: Normal rate and regular rhythm.     Pulses: Intact distal pulses.     Heart sounds: Normal heart sounds. No murmur heard.    No gallop.  Pulmonary:     Effort: Pulmonary effort is normal.     Breath sounds: Normal breath sounds.  Chest:  Breasts:    Right: Tenderness (left costochondral junction) present.  Abdominal:     General: Bowel sounds are normal.     Palpations: Abdomen is soft.  Musculoskeletal:     Right lower leg: No edema.     Left lower leg: No edema.        05/27/2024   11:21 AM 03/08/2023    3:02 PM 01/30/2023    2:23 PM  Last 3 Weights  Weight (lbs) 200 lb 200 lb 207 lb 3.2 oz  Weight (kg) 90.719 kg 90.719 kg 93.985 kg      Labs   Lab Results  Component Value Date   NA 137 05/29/2024   K 3.6 05/29/2024   CO2 25 05/29/2024   GLUCOSE 96 05/29/2024   BUN 16 05/29/2024   CREATININE 1.00 05/29/2024   CALCIUM  9.0 05/29/2024   EGFR 57 (L) 01/24/2023   GFRNONAA >60 05/29/2024       Latest Ref Rng &  Units 05/29/2024    2:16 AM 05/28/2024    3:59 AM 05/27/2024   11:40 AM  BMP  Glucose 70 - 99 mg/dL 96  878  856   BUN 6 - 20 mg/dL 16  12  13    Creatinine 0.44 - 1.00 mg/dL 8.99  9.16  9.08   Sodium 135 - 145 mmol/L 137  138  136   Potassium 3.5 - 5.1 mmol/L 3.6  3.5  3.9   Chloride 98 - 111 mmol/L 103  104  103   CO2 22 - 32 mmol/L 25  25  22    Calcium  8.9 - 10.3 mg/dL 9.0  9.2  9.1        Latest Ref Rng & Units 05/29/2024    2:16 AM 05/28/2024    2:28 AM 05/27/2024   11:40 AM  CBC  WBC 4.0 - 10.5 K/uL 6.9  8.7  10.1   Hemoglobin 12.0 - 15.0 g/dL 86.5  86.0  86.8   Hematocrit 36.0 - 46.0 % 42.8  44.5  41.5   Platelets 150 - 400 K/uL 244  266  259     Lab Results  Component Value Date   CHOL 197 05/29/2024   HDL 56  05/29/2024   LDLCALC 123 (H) 05/29/2024   TRIG 89 05/29/2024   CHOLHDL 3.5 05/29/2024    Lab Results  Component Value Date   TSH 0.696 12/29/2021    Lab Results  Component Value Date   HGBA1C 6.0 (H) 01/24/2023    Cardiac Panel (last 3 results) Recent Labs    05/27/24 1140 05/27/24 1525  TROPONINIHS 8 8   Tele/EKG/Cardiac studies    Telemetry: Sinus bradycardia/normal sinus rhythm.  Echocardiogram 05/28/2024:  1. Septal hypokinesis. Lateral hypokinesis (base, mid). Left ventricular ejection fraction, by estimation, is 45 to 50%. The left ventricle has mildly decreased function. The left ventricular internal cavity size was severely dilated. Left ventricular  diastolic parameters are consistent with Grade I diastolic dysfunction (impaired relaxation). (No suggestion of infiltrative cardiomyopathy)  2. Right ventricular systolic function is normal. The right ventricular size is normal. There is normal pulmonary artery systolic pressure.  3. A small pericardial effusion is present.  4. The mitral valve is normal in structure. Mild mitral valve regurgitation.  5. The aortic valve is tricuspid. Aortic valve regurgitation is not visualized. Images personally reviewed, EF appears to be 30% with global hypokinesis and septal hypokinesis.   Compared to 03/10/2022, EF was 55 to 60%  with severe LVH and speckled pattern.  Echocardiogram on 11/20/2021, EF was 20 to 25%, no suggestion of speckled pattern or severe LVH.  Current Meds:     Current Facility-Administered Medications:    acetaminophen  (TYLENOL ) tablet 500 mg, 500 mg, Oral, Q6H PRN, Shona Terry SAILOR, DO, 500 mg at 05/28/24 2028   aspirin  EC tablet 81 mg, 81 mg, Oral, Daily, Shona Terry SAILOR, DO, 81 mg at 05/28/24 1026   atorvastatin  (LIPITOR) tablet 20 mg, 20 mg, Oral, Daily, Hall, Carole N, DO, 20 mg at 05/28/24 1026   carvedilol  (COREG ) tablet 6.25 mg, 6.25 mg, Oral, BID WC, Hall, Carole N, DO, 6.25 mg at 05/29/24 9270    dapagliflozin  propanediol (FARXIGA ) tablet 10 mg, 10 mg, Oral, Daily, Garrick Christians N, PA-C, 10 mg at 05/28/24 2015   diclofenac Sodium (VOLTAREN) 1 % topical gel 2 g, 2 g, Topical, QID, Perri DELENA Meliton Mickey., MD   enoxaparin  (LOVENOX ) injection 40 mg, 40 mg, Subcutaneous, Q24H, Shona Terry SAILOR,  DO, 40 mg at 05/28/24 2236   hydrALAZINE  (APRESOLINE ) tablet 100 mg, 100 mg, Oral, TID, Drusilla, Sabas RAMAN, MD   hydrALAZINE  (APRESOLINE ) tablet 25 mg, 25 mg, Oral, Q6H PRN, Opyd, Timothy S, MD   hydrALAZINE  (APRESOLINE ) tablet 50 mg, 50 mg, Oral, Once, Lama, Sabas RAMAN, MD   isosorbide  dinitrate (ISORDIL ) tablet 30 mg, 30 mg, Oral, TID, Garrick, Logan N, PA-C, 30 mg at 05/28/24 2015   melatonin tablet 5 mg, 5 mg, Oral, QHS PRN, Shona Laurence N, DO   morphine  (PF) 4 MG/ML injection 4 mg, 4 mg, Intravenous, Q3H PRN, Shona Laurence N, DO, 4 mg at 05/29/24 0735   nitroGLYCERIN  (NITROSTAT ) SL tablet 0.4 mg, 0.4 mg, Sublingual, Q5 min PRN, Perri DELENA Meliton Mickey., MD, 0.4 mg at 05/28/24 1627   oxyCODONE  (Oxy IR/ROXICODONE ) immediate release tablet 5-10 mg, 5-10 mg, Oral, Q6H PRN, Shona Laurence N, DO, 10 mg at 05/29/24 0617   polyethylene glycol (MIRALAX  / GLYCOLAX ) packet 17 g, 17 g, Oral, Daily PRN, Hall, Carole N, DO   prochlorperazine  (COMPAZINE ) injection 5 mg, 5 mg, Intravenous, Q6H PRN, Hall, Carole N, DO   sacubitril -valsartan  (ENTRESTO ) 49-51 mg per tablet, 1 tablet, Oral, BID, Lockwood, Logan N, PA-C, 1 tablet at 05/28/24 2236  Current Outpatient Medications:    acetaminophen  (TYLENOL ) 325 MG tablet, Take 650 mg by mouth every 6 (six) hours as needed for mild pain (pain score 1-3) or moderate pain (pain score 4-6)., Disp: , Rfl:    amLODipine  (NORVASC ) 5 MG tablet, Take 1 tablet (5 mg total) by mouth daily., Disp: 90 tablet, Rfl: 1   atorvastatin  (LIPITOR) 20 MG tablet, Take 1 tablet (20 mg total) by mouth daily., Disp: 90 tablet, Rfl: 1   carvedilol  (COREG ) 12.5 MG tablet, Take 1 tablet (12.5 mg total) by mouth  2 (two) times daily with a meal., Disp: 180 tablet, Rfl: 1   dapagliflozin  propanediol (FARXIGA ) 10 MG TABS tablet, Take 1 tablet (10 mg total) by mouth daily., Disp: 90 tablet, Rfl: 3   furosemide  (LASIX ) 20 MG tablet, Take 1 tablet (20 mg total) by mouth as needed. For weight gain 3 lbs in 24 hours or 5 lbs in a week PLEASE SCHEDULE APPOINTMENT FOR MORE REFILLS, Disp: 30 tablet, Rfl: 1   hydrALAZINE  (APRESOLINE ) 100 MG tablet, Take 1 tablet (100 mg total) by mouth 3 (three) times daily., Disp: 270 tablet, Rfl: 3   isosorbide  mononitrate (IMDUR ) 30 MG 24 hr tablet, Take 2 tablets (60 mg total) by mouth daily., Disp: 180 tablet, Rfl: 3   sacubitril -valsartan  (ENTRESTO ) 49-51 MG, Take 1 tablet by mouth 2 (two) times daily. PLEASE SCHEDULE APPOINTMENT FOR MORE REFILLS, Disp: 60 tablet, Rfl: 0  Assessment & Plan .     1.  Musculoskeletal chest pain 2.  Resident hypertension 3.  Nonischemic cardiomyopathy 4.  Noncompliance with medications  Recommendation: Patient has been ruled out for pulmonary embolism.  CT scan of the chest abdomen and pelvis also did not reveal any evidence of renal artery stenosis. Patient's blood pressure continues to be uncontrolled and will add amlodipine  10 mg daily, she was on 5 mg at home.  Will also increase Entresto  to 97/103 mg dose.  Patient can be discharged home today as her blood pressure issues are chronic and will monitor her blood pressure in the outpatient basis and make recommendations and consider addition of spironolactone .  She may need referral for hypertension clinic and consideration for renal denervation.  Will also follow-up on cardiac  MR findings.  With regard to musculoskeletal chest pain, Reasons for reproducible pain explained to the patient. Heat to tender area. Cold ice compressions to reduce inflammation. Recommended Tylenol , Aleve OTC 1-2 tablets BID for 3-4 days and PRN. Reassured the patient.  Will schedule for outpatient follow-up with heart  failure clinic for further management.  For questions or updates, please contact [facility] Please consult [email or web address] for contact info under        Signed,   Gordy Bergamo, MD, [facility] 05/29/2024, 9:40 AM [facility] [address] [place], [place] [address] Phone: [phone]. Fax:  [phone]

## 2024-05-29 NOTE — Telephone Encounter (Signed)
 Called to confirm/remind patient of their appointment at the [facility] on 05/29/2024.   Appointment:   [x] Confirmed  [] Left mess   [] No answer/No voice mail  [] VM Full/unable to leave message  [] Phone not in service  Patient reminded to bring all medications and/or complete list.  Confirmed patient has transportation. Gave directions, instructed to utilize valet parking.

## 2024-05-29 NOTE — ED Notes (Signed)
 Pt ambulatory to bathroom without difficulty. Placed on hospital bed for comfort.

## 2024-05-29 NOTE — Progress Notes (Incomplete)
 [facility]  PROGRESS NOTE  Dynastie Knoop FMW:[id number] DOB: [birth year] DOA: 05/27/2024 PCP: Paseda, Folashade R, FNP   Brief HPI:   ***    Assessment/Plan:   ***      DVT prophylaxis: ***  Medications     aspirin  EC  81 mg Oral Daily   atorvastatin   20 mg Oral Daily   carvedilol   6.25 mg Oral BID WC   dapagliflozin  propanediol  10 mg Oral Daily   diclofenac  Sodium  2 g Topical QID   enoxaparin  (LOVENOX ) injection  40 mg Subcutaneous Q24H   hydrALAZINE   50 mg Oral TID   isosorbide  dinitrate  30 mg Oral TID   sacubitril -valsartan   1 tablet Oral BID     Data Reviewed:   CBG:  No results for input(s): GLUCAP in the last 168 hours.  SpO2: 99 %    Vitals:   05/29/24 0615 05/29/24 0619 05/29/24 0640 05/29/24 0726  BP: (!) 191/93  (!) 186/105 (!) 210/92  Pulse: 62  (!) 53 62  Resp: (!) 22  11 16   Temp:  98.3 F (36.8 C)  97.6 F (36.4 C)  TempSrc:  Oral  Oral  SpO2: 97%  93% 99%  Weight:      Height:          Data Reviewed:  Basic Metabolic Panel: Recent Labs  Lab 05/27/24 1140 05/28/24 0359 05/29/24 0216  NA 136 138 137  K 3.9 3.5 3.6  CL 103 104 103  CO2 22 25 25   GLUCOSE 143* 121* 96  BUN 13 12 16   CREATININE 0.91 0.83 1.00  CALCIUM  9.1 9.2 9.0  MG  --  2.3 2.0  PHOS  --  4.3 3.9    CBC: Recent Labs  Lab 05/27/24 1140 05/28/24 0228 05/29/24 0216  WBC 10.1 8.7 6.9  NEUTROABS 7.8*  --  4.0  HGB 13.1 13.9 13.4  HCT 41.5 44.5 42.8  MCV 84.2 85.6 84.8  PLT 259 266 244    LFT Recent Labs  Lab 05/27/24 1140 05/29/24 0216  AST 21 18  ALT 14 11  ALKPHOS 55 53  BILITOT 0.6 0.9  PROT 7.4 6.9  ALBUMIN  3.8 3.7     Antibiotics: Anti-infectives (From admission, onward)    None        CONSULTS ***  Code Status: ***  Family Communication: ***     Subjective   ***   Objective    Physical Examination:   General:  *** Cardiovascular: *** Respiratory: *** Abdomen: *** Extremities: ***   Neurologic:  ***   Status is: Inpatient:  ***           Davine Sweney S Basha Krygier   [facility] If 7PM-7AM, please contact night-coverage at [email or web address], Office  [phone]   05/29/2024, 8:00 AM  LOS: 0 days

## 2024-05-30 ENCOUNTER — Observation Stay (HOSPITAL_COMMUNITY)
Admit: 2024-05-30 | Discharge: 2024-05-30 | Disposition: A | Discharge status: Home / Self Care | Attending: Internal Medicine | Admitting: Internal Medicine

## 2024-05-30 VITALS — BP 122/74 | HR 88 | Wt 208.2 lb

## 2024-05-30 DIAGNOSIS — Z720 Tobacco use: Secondary | ICD-10-CM

## 2024-05-30 DIAGNOSIS — I5022 Chronic systolic (congestive) heart failure: Secondary | ICD-10-CM

## 2024-05-30 DIAGNOSIS — I340 Nonrheumatic mitral (valve) insufficiency: Secondary | ICD-10-CM | POA: Diagnosis not present

## 2024-05-30 DIAGNOSIS — I10 Essential (primary) hypertension: Secondary | ICD-10-CM | POA: Diagnosis not present

## 2024-05-30 NOTE — Patient Instructions (Signed)
 There has been no changes to your medications.  PLEASE CHECK YOUR BLOOD PRESSURE ONCE A DAY AND LOG IT IN YOUR PHONE   Your physician recommends that you schedule a follow-up appointment in: 1 month.  If you have any questions or concerns before your next appointment please send us  a message through [place] or call our office at [phone].    TO LEAVE A MESSAGE FOR THE NURSE SELECT OPTION 2, PLEASE LEAVE A MESSAGE INCLUDING: YOUR NAME DATE OF BIRTH CALL BACK NUMBER REASON FOR CALL**this is important as we prioritize the call backs  YOU WILL RECEIVE A CALL BACK THE SAME DAY AS LONG AS YOU CALL BEFORE 4:00 PM  At the [facility], you and your health needs are our priority. As part of our continuing mission to provide you with exceptional heart care, we have created designated Provider Care Teams. These Care Teams include your primary Cardiologist (physician) and Advanced Practice Providers (APPs- Physician Assistants and Nurse Practitioners) who all work together to provide you with the care you need, when you need it.   You may see any of the following providers on your designated Care Team at your next follow up: Dr Toribio Fuel Dr Ezra Shuck Dr. Morene Brownie Greig Mosses, NP Caffie Shed, [place] [facility] [place], [place] Beckey Coe, NP Jordan Lee, NP Ellouise Class, NP Tinnie Redman, PharmD Jaun Bash, PharmD   Please be sure to bring in all your medications bottles to every appointment.    Thank you for choosing [facility]

## 2024-05-30 NOTE — Progress Notes (Addendum)
 [facility] Note   PCP: Paseda, Folashade R, FNP HF Cardiologist: Dr. Cherrie  HPI: 53 y.o. AAF w/ h/o HTN and chronic systolic heart failure. In May 2016, she underwent left VATS w/ drainage of empyema/ abscess and decortication by Dr. Kerrin. Echocardiogram showed normal EF 50-55%.   Admitted 8/16 w/ gastroenteritis/ sepsis and HTN urgency. Cardiology consulted for elevated troponin. Echo showed mildly reduced LVEF 40-45%, RV normal. LHC showed normal coronaries. Drop in EF felt to be stress induced   Presented to ED on 11/20/20 w/ CP and exertional dyspnea. She had stopped taking all of her BP meds BP 220/137. She was admitted and restarted on PO antihypertensives and IV Lasix . Echo EF, 20-25%, GIIIDD, no LVH, Global HK, RV mildly reduced, RVSP 60 mmHg, mod MR, Mod TR. cMRI showed severely reduced LVEF, moderately reduced RV, small pericardia effusion  Admitted 5/23 with CP and hypertensive emergency, in setting of medication non compliance. BP 198/131. Echo EF 20-25%, severe LV dysfunction with global HK, RV ok, circumferential pericardial effusion.   Echo 03/10/22 EF 55-60% G1DD   Admitted 7/24 with hypertensive urgency, BP 233/124, 2/2 to medication noncompliance. Underwent MRI brain for left sided numbness, showing no acute processes. UDS + opiates and THC. Given IV anti-hypertensives and home BP meds restarted. She was discharged home.  Today she returns for post hospital HF follow up. Admitted earlier this week with atypical CP - SBP on presentation was 174/99. Hstrop negative  CT PE negative.  Echo EF 40-45% Underwent cMRI EF 39% mod LVH RV 57%  Patchy miwall LGE felt to be nonspecific. Said she was off all her meds for a while and started feeling bad so started her meds about a month ago. Stopped smoking in May. (Smells like smoke here - sisters both smoke). Reports compliance with meds. No edema, orthopne or PND Not taking lasix  but has it she needs it    Cardiac Studies - Echo (8/23): EF 55-60%, G1DD  - Echo (5/23): EF 20-25%, severely depressed LV with global HK, mildly LVH, RV ok, circumferential pericardial effusion, IV small.  - Echo (6/22): LVEF 20-25%, GIIIDD, no LVH, Global HK, RV mildly reduced, RVSP 60 mmHg, mod MR, Mod TR  - Echo (6/20): LVEF 40-45%, RV mildly reduced  - Echo (8/16): LVEF 40-45%, AK of apical myocardium, G2DD, RV normal  - Echo (5/16):  LVEF 50-55%, RV normal  - LHC (2016): normal coronaries   - R/LHC (6/16): Prox LAD lesion is 45% stenosed. Ao = 177/94 (126) LV = 176/18 RA = 5 RV = 64/9 PA = 64/17 (35) PCW = 18 Fick cardiac output/index = 5.0/2.8 PVR = 3.4 WU Ao sat = 97% PA sat = 68%, 69%   Assessment: 1. Mild CAD 2. Severe NICM likely due to HTN CM 3. 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 Left-sided pressures relatively well compemsated.   - Renal artery u/s (6/22): no RAS or FMD   - cMRI (6/22): Severely reduced LV systolic function with moderate LV dilation, LVEF 22%. Global hypokinesis. Moderately reduced RV systolic function with mild RV enlargement. RVEF 35%. Delayed myocardial enhancement in the superior and inferior RV insertion points, with extension to the midmyocardium in the antero- and inferoseptum. May represent increased pulmartery pressure. Small circumferential pericardial effusion. Findings most consistent with hypertensive heart disease and pulmonary hypertension.  ROS: All systems reviewed and negative except as per HPI.   Past Medical History:  Diagnosis Date   Cerumen impaction 01/2019  CHF (congestive heart failure) (HCC)    after last pregnancy   Daily headache    Heart murmur    born w/one:   History of hiatal hernia    Hyperlipidemia 01/2019   Hypertension    Hypertensive emergency 01/02/2019   Observed seizure-like activity (HCC) 01/02/2019   Pneumonia 11/2014   Seasonal allergies 01/2019   Vitamin D  deficiency 01/2019   Current Outpatient  Medications  Medication Sig Dispense Refill   acetaminophen  (TYLENOL ) 325 MG tablet Take 650 mg by mouth every 6 (six) hours as needed for mild pain (pain score 1-3) or moderate pain (pain score 4-6).     amLODipine  (NORVASC ) 10 MG tablet Take 1 tablet (10 mg total) by mouth daily. 30 tablet 1   aspirin  EC 81 MG tablet Take 1 tablet (81 mg total) by mouth daily. Swallow whole. 30 tablet 1   atorvastatin  (LIPITOR) 20 MG tablet Take 1 tablet (20 mg total) by mouth daily. 90 tablet 1   carvedilol  (COREG ) 12.5 MG tablet Take 1 tablet (12.5 mg total) by mouth 2 (two) times daily with a meal. 180 tablet 1   dapagliflozin  propanediol (FARXIGA ) 10 MG TABS tablet Take 1 tablet (10 mg total) by mouth daily. 90 tablet 3   diclofenac  Sodium (VOLTAREN ) 1 % GEL Apply 2 g topically 4 (four) times daily for 3 days. 100 g 0   furosemide  (LASIX ) 20 MG tablet Take 1 tablet (20 mg total) by mouth as needed. For weight gain 3 lbs in 24 hours or 5 lbs in a week PLEASE SCHEDULE APPOINTMENT FOR MORE REFILLS 30 tablet 1   hydrALAZINE  (APRESOLINE ) 100 MG tablet Take 1 tablet (100 mg total) by mouth 3 (three) times daily. 270 tablet 3   isosorbide  mononitrate (IMDUR ) 30 MG 24 hr tablet Take 2 tablets (60 mg total) by mouth daily. 180 tablet 3   sacubitril -valsartan  (ENTRESTO ) 97-103 MG Take 1 tablet by mouth 2 (two) times daily. 60 tablet 1   No current facility-administered medications for this encounter.   No Known Allergies  Social History   Socioeconomic History   Marital status: Divorced    Spouse name: Not on file   Number of children: 2   Years of education: Not on file   Highest education level: Not on file  Occupational History   Not on file  Tobacco Use   Smoking status: Former    Current packs/day: 0.00    Average packs/day: 0.3 packs/day for 10.0 years (3.3 ttl pk-yrs)    Types: Cigarettes    Start date: 11/10/2004    Quit date: 11/11/2014    Years since quitting: 9.5   Smokeless tobacco: Never   Vaping Use   Vaping status: Never Used  Substance and Sexual Activity   Alcohol use: Yes    Alcohol/week: 0.0 standard drinks of alcohol    Comment: 8/1/20176 a couple beers a couple times/month   Drug use: No   Sexual activity: Yes  Other Topics Concern   Not on file  Social History Narrative   Lives with her sister    Social Drivers of Health   Financial Resource Strain: High Risk (12/23/2020)   Overall Financial Resource Strain (CARDIA)    Difficulty of Paying Living Expenses: Hard  Food Insecurity: Patient Declined (05/29/2024)   Hunger Vital Sign    Worried About Running Out of Food in the Last Year: Patient declined    Ran Out of Food in the Last Year: Patient declined  Transportation  Needs: No Transportation Needs (05/28/2024)   PRAPARE - Administrator, Civil Service (Medical): No    Lack of Transportation (Non-Medical): No  Physical Activity: Not on file  Stress: Not on file  Social Connections: Socially Isolated (05/29/2024)   Social Connection and Isolation Panel    Frequency of Communication with Friends and Family: More than three times a week    Frequency of Social Gatherings with Friends and Family: Once a week    Attends Religious Services: Never    Database Administrator or Organizations: No    Attends Banker Meetings: Never    Marital Status: Divorced  Catering Manager Violence: Not At Risk (05/29/2024)   Humiliation, Afraid, Rape, and Kick questionnaire    Fear of Current or Ex-Partner: No    Emotionally Abused: No    Physically Abused: No    Sexually Abused: No   Family History  Problem Relation Age of Onset   Diabetes Mellitus II Mother    Hypertension Mother    Kidney failure Mother    Lung cancer Father    Stroke Maternal Grandmother    BP 122/74   Pulse 88   Wt 94.4 kg (208 lb 3.2 oz)   LMP 12/31/2014   SpO2 98%   BMI 31.66 kg/m   Wt Readings from Last 3 Encounters:  05/30/24 94.4 kg (208 lb 3.2 oz)   05/27/24 90.7 kg (200 lb)  03/08/23 90.7 kg (200 lb)   PHYSICAL EXAM: General:  Sitting up in bed. No resp difficulty HEENT: normal Neck: supple. no JVD.  Cor: Regular rate & rhythm. No rubs, gallops or murmurs. Lungs: clear Abdomen:obese  soft, nontender, nondistended.Good bowel sounds. Extremities: no cyanosis, clubbing, rash, edema Neuro: alert & orientedx3, cranial nerves grossly intact. moves all 4 extremities w/o difficulty. Affect pleasant  ReDs: 30%  ECG (personally reviewed): NSR 86 bpm  ASSESSMENT & PLAN: Chronic Systolic Heart Failure with recovered EF - NICM.  - LHC in 2016 showed normal coronaries - Echo (5/16):  LVEF 50-55%, RV normal - Echo (6/22): EF 20-25%, RV mildly reduced, in the setting of poorly controlled HTN/ hypertensive emergency  - L/RHC (6/22) w/ mild CAD. Suspect CM 2/2 HTN, RHC after diureses showed well compensated left sided filling pressures - cMRI (6/22): EF 22% severely reduced, RVEF 35% moderately down, small circumferential pericardial effusion, findings consistent with hypertensive heart disease and pulmonary HTN. - Echo (5/23) EF 20-25%, RV ok - Echo (8/23): EF 55-60% G1DD (recovered with good BP control) - Echo 11/25 EF 40-45% - cMRI 11/25EF 39% mod LVH RV 57%  Patchy miwall LGE felt to be nonspecific - NYHA II. Volume ok today Continue lasix  prn  - Entresto  97/103 mg bid.  - Continue hydralazine  100 mg tid + Imdur  60 mg daily. - Continue Farxiga  10 mg daily. - Continue Coreg  12.5 mg bid.   2. Severe HTN - BP now improved - She works in [place]. Encouraged her to take BP daily and record   3. MR - Moderate on echo 6/22, likely functional in setting of dilated LV/RV. - Mild on most recent echo. Reviewed personally.   4. Pulmonary HTN - RVSP severely elevated on echo (6/22) 60 mmHg. - Suspect primarily 2/2 left sided heart disease/ acute CHF w/ elevated filling pressures. - RHC (6/22) w/ mild to moderate mixed pulmonary HTN. Not  candidate for selective pulmonary vasodilators - No OSA on sleep study 9/22.   5. Tobacco abuse - Reports quitting  in 5/25    6. CAD - LHC (6/22): LAD 45%. - No s/s angina - Continue statin.  - Check lipids at next lab draw  7. Obesity - Body mass index is 31.66 kg/m. - Consider [id number]  Toribio Fuel, MD 05/30/24

## 2024-06-03 NOTE — Addendum Note (Signed)
 Encounter addended by: Isbella Arline R, MD on: 06/03/2024 2:08 PM  Actions taken: Clinical Note Signed

## 2024-06-04 ENCOUNTER — Ambulatory Visit (HOSPITAL_COMMUNITY): Admitting: Cardiology

## 2024-06-26 NOTE — Progress Notes (Incomplete)
 [facility] Note   PCP: Paseda, Folashade R, FNP HF Cardiologist: Dr. Cherrie  HPI: 53 y.o. AAF w/ h/o HTN and chronic systolic heart failure. In May 2016, she underwent left VATS w/ drainage of empyema/ abscess and decortication by Dr. Kerrin. Echocardiogram showed normal EF 50-55%.   Admitted 8/16 w/ gastroenteritis/ sepsis and HTN urgency. Cardiology consulted for elevated troponin. Echo showed mildly reduced LVEF 40-45%, RV normal. LHC showed normal coronaries. Drop in EF felt to be stress induced   Presented to ED on 11/20/20 w/ CP and exertional dyspnea. She had stopped taking all of her BP meds BP 220/137. She was admitted and restarted on PO antihypertensives and IV Lasix . Echo EF, 20-25%, GIIIDD, no LVH, Global HK, RV mildly reduced, RVSP 60 mmHg, mod MR, Mod TR. cMRI showed severely reduced LVEF, moderately reduced RV, small pericardia effusion  Admitted 5/23 with CP and hypertensive emergency, in setting of medication non compliance. BP 198/131. Echo EF 20-25%, severe LV dysfunction with global HK, RV ok, circumferential pericardial effusion.   Echo 03/10/22 EF 55-60% G1DD   Admitted 7/24 with hypertensive urgency, BP 233/124, 2/2 to medication noncompliance. Underwent MRI brain for left sided numbness, showing no acute processes. UDS + opiates and THC. Given IV anti-hypertensives and home BP meds restarted. She was discharged home.  Today she returns for post hospital HF follow up. Admitted earlier this week with atypical CP - SBP on presentation was 174/99. Hstrop negative  CT PE negative.  Echo EF 40-45% Underwent cMRI EF 39% mod LVH RV 57%  Patchy miwall LGE felt to be nonspecific. Said she was off all her meds for a while and started feeling bad so started her meds about a month ago. Stopped smoking in May. (Smells like smoke here - sisters both smoke). Reports compliance with meds. No edema, orthopne or PND Not taking lasix  but has it she needs it    Cardiac Studies - Echo (8/23): EF 55-60%, G1DD  - Echo (5/23): EF 20-25%, severely depressed LV with global HK, mildly LVH, RV ok, circumferential pericardial effusion, IV small.  - Echo (6/22): LVEF 20-25%, GIIIDD, no LVH, Global HK, RV mildly reduced, RVSP 60 mmHg, mod MR, Mod TR  - Echo (6/20): LVEF 40-45%, RV mildly reduced  - Echo (8/16): LVEF 40-45%, AK of apical myocardium, G2DD, RV normal  - Echo (5/16):  LVEF 50-55%, RV normal  - LHC (2016): normal coronaries   - R/LHC (6/16): Prox LAD lesion is 45% stenosed. Ao = 177/94 (126) LV = 176/18 RA = 5 RV = 64/9 PA = 64/17 (35) PCW = 18 Fick cardiac output/index = 5.0/2.8 PVR = 3.4 WU Ao sat = 97% PA sat = 68%, 69%   Assessment: 1. Mild CAD 2. Severe NICM likely due to HTN CM 3. 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 Left-sided pressures relatively well compemsated.   - Renal artery u/s (6/22): no RAS or FMD   - cMRI (6/22): Severely reduced LV systolic function with moderate LV dilation, LVEF 22%. Global hypokinesis. Moderately reduced RV systolic function with mild RV enlargement. RVEF 35%. Delayed myocardial enhancement in the superior and inferior RV insertion points, with extension to the midmyocardium in the antero- and inferoseptum. May represent increased pulmartery pressure. Small circumferential pericardial effusion. Findings most consistent with hypertensive heart disease and pulmonary hypertension.  ROS: All systems reviewed and negative except as per HPI.   Past Medical History:  Diagnosis Date   Cerumen impaction 01/2019  CHF (congestive heart failure) (HCC)    after last pregnancy   Daily headache    Heart murmur    born w/one:   History of hiatal hernia    Hyperlipidemia 01/2019   Hypertension    Hypertensive emergency 01/02/2019   Observed seizure-like activity (HCC) 01/02/2019   Pneumonia 11/2014   Seasonal allergies 01/2019   Vitamin D  deficiency 01/2019   Current Outpatient  Medications  Medication Sig Dispense Refill   acetaminophen  (TYLENOL ) 325 MG tablet Take 650 mg by mouth every 6 (six) hours as needed for mild pain (pain score 1-3) or moderate pain (pain score 4-6).     amLODipine  (NORVASC ) 10 MG tablet Take 1 tablet (10 mg total) by mouth daily. 30 tablet 1   aspirin  EC 81 MG tablet Take 1 tablet (81 mg total) by mouth daily. Swallow whole. 30 tablet 1   atorvastatin  (LIPITOR) 20 MG tablet Take 1 tablet (20 mg total) by mouth daily. 90 tablet 1   carvedilol  (COREG ) 12.5 MG tablet Take 1 tablet (12.5 mg total) by mouth 2 (two) times daily with a meal. 180 tablet 1   dapagliflozin  propanediol (FARXIGA ) 10 MG TABS tablet Take 1 tablet (10 mg total) by mouth daily. 90 tablet 3   furosemide  (LASIX ) 20 MG tablet Take 1 tablet (20 mg total) by mouth as needed. For weight gain 3 lbs in 24 hours or 5 lbs in a week PLEASE SCHEDULE APPOINTMENT FOR MORE REFILLS 30 tablet 1   hydrALAZINE  (APRESOLINE ) 100 MG tablet Take 1 tablet (100 mg total) by mouth 3 (three) times daily. 270 tablet 3   isosorbide  mononitrate (IMDUR ) 30 MG 24 hr tablet Take 2 tablets (60 mg total) by mouth daily. 180 tablet 3   sacubitril -valsartan  (ENTRESTO ) 97-103 MG Take 1 tablet by mouth 2 (two) times daily. 60 tablet 1   No current facility-administered medications for this visit.   No Known Allergies  Social History   Socioeconomic History   Marital status: Divorced    Spouse name: Not on file   Number of children: 2   Years of education: Not on file   Highest education level: Not on file  Occupational History   Not on file  Tobacco Use   Smoking status: Former    Current packs/day: 0.00    Average packs/day: 0.3 packs/day for 10.0 years (3.3 ttl pk-yrs)    Types: Cigarettes    Start date: 11/10/2004    Quit date: 11/11/2014    Years since quitting: 9.6   Smokeless tobacco: Never  Vaping Use   Vaping status: Never Used  Substance and Sexual Activity   Alcohol use: Yes     Alcohol/week: 0.0 standard drinks of alcohol    Comment: 8/1/20176 a couple beers a couple times/month   Drug use: No   Sexual activity: Yes  Other Topics Concern   Not on file  Social History Narrative   Lives with her sister    Social Drivers of Health   Tobacco Use: Medium Risk (05/27/2024)   Patient History    Smoking Tobacco Use: Former    Smokeless Tobacco Use: Never    Passive Exposure: Not on Actuary Strain: Not on file  Food Insecurity: Patient Declined (05/29/2024)   Epic    Worried About Programme Researcher, Broadcasting/film/video in the Last Year: Patient declined    Barista in the Last Year: Patient declined  Transportation Needs: No Transportation Needs (05/28/2024)   Epic  Lack of Transportation (Medical): No    Lack of Transportation (Non-Medical): No  Physical Activity: Not on file  Stress: Not on file  Social Connections: Socially Isolated (05/29/2024)   Social Connection and Isolation Panel    Frequency of Communication with Friends and Family: More than three times a week    Frequency of Social Gatherings with Friends and Family: Once a week    Attends Religious Services: Never    Database Administrator or Organizations: No    Attends Banker Meetings: Never    Marital Status: Divorced  Catering Manager Violence: Not At Risk (05/29/2024)   Epic    Fear of Current or Ex-Partner: No    Emotionally Abused: No    Physically Abused: No    Sexually Abused: No  Depression (PHQ2-9): Medium Risk (01/24/2023)   Depression (PHQ2-9)    PHQ-2 Score: 5  Alcohol Screen: Not on file  Housing: Low Risk (05/29/2024)   Epic    Unable to Pay for Housing in the Last Year: No    Number of Times Moved in the Last Year: 0    Homeless in the Last Year: No  Utilities: Not At Risk (05/29/2024)   Epic    Threatened with loss of utilities: No  Health Literacy: Not on file   Family History  Problem Relation Age of Onset   Diabetes Mellitus II Mother     Hypertension Mother    Kidney failure Mother    Lung cancer Father    Stroke Maternal Grandmother    LMP 12/31/2014   Wt Readings from Last 3 Encounters:  05/30/24 94.4 kg (208 lb 3.2 oz)  05/27/24 90.7 kg (200 lb)  03/08/23 90.7 kg (200 lb)   PHYSICAL EXAM: General:  Sitting up in bed. No resp difficulty HEENT: normal Neck: supple. no JVD.  Cor: Regular rate & rhythm. No rubs, gallops or murmurs. Lungs: clear Abdomen:obese  soft, nontender, nondistended.Good bowel sounds. Extremities: no cyanosis, clubbing, rash, edema Neuro: alert & orientedx3, cranial nerves grossly intact. moves all 4 extremities w/o difficulty. Affect pleasant  ReDs: 30%  ECG (personally reviewed): NSR 86 bpm  ASSESSMENT & PLAN: Chronic Systolic Heart Failure with recovered EF - NICM.  - LHC in 2016 showed normal coronaries - Echo (5/16):  LVEF 50-55%, RV normal - Echo (6/22): EF 20-25%, RV mildly reduced, in the setting of poorly controlled HTN/ hypertensive emergency  - L/RHC (6/22) w/ mild CAD. Suspect CM 2/2 HTN, RHC after diureses showed well compensated left sided filling pressures - cMRI (6/22): EF 22% severely reduced, RVEF 35% moderately down, small circumferential pericardial effusion, findings consistent with hypertensive heart disease and pulmonary HTN. - Echo (5/23) EF 20-25%, RV ok - Echo (8/23): EF 55-60% G1DD (recovered with good BP control) - Echo 11/25 EF 40-45% - cMRI 11/25EF 39% mod LVH RV 57%  Patchy miwall LGE felt to be nonspecific - NYHA II. Volume ok today Continue lasix  prn  - Entresto  97/103 mg bid.  - Continue hydralazine  100 mg tid + Imdur  60 mg daily. - Continue Farxiga  10 mg daily. - Continue Coreg  12.5 mg bid.   2. Severe HTN - BP now improved - She works in [place]. Encouraged her to take BP daily and record   3. MR - Moderate on echo 6/22, likely functional in setting of dilated LV/RV. - Mild on most recent echo. Reviewed personally.   4. Pulmonary HTN - RVSP  severely elevated on echo (6/22) 60 mmHg. -  Suspect primarily 2/2 left sided heart disease/ acute CHF w/ elevated filling pressures. - RHC (6/22) w/ mild to moderate mixed pulmonary HTN. Not candidate for selective pulmonary vasodilators - No OSA on sleep study 9/22.   5. Tobacco abuse - Reports quitting in 5/25    6. CAD - LHC (6/22): LAD 45%. - No s/s angina - Continue statin.  - Check lipids at next lab draw  7. Obesity - There is no height or weight on file to calculate BMI. - Consider GLP1RA  Harlene CHRISTELLA Gainer, FNP 06/26/2024

## 2024-06-27 ENCOUNTER — Telehealth (HOSPITAL_COMMUNITY): Payer: Self-pay

## 2024-06-27 NOTE — Telephone Encounter (Signed)
 Called to confirm/remind patient of their appointment at the [facility] on 09/27/23.   Appointment:   [] Confirmed  [x] Left mess   [] No answer/No voice mail  [] VM Full/unable to leave message  [] Phone not in service  And to bring in all medications and/or complete list.

## 2024-06-28 ENCOUNTER — Ambulatory Visit (HOSPITAL_COMMUNITY)
